# Patient Record
Sex: Male | Born: 1945
Health system: Southern US, Community
[De-identification: ages and names within clinical notes are randomized; demographics above are authoritative.]

## PROBLEM LIST (undated history)

## (undated) DIAGNOSIS — R296 Repeated falls: Secondary | ICD-10-CM

## (undated) DIAGNOSIS — K449 Diaphragmatic hernia without obstruction or gangrene: Secondary | ICD-10-CM

## (undated) DIAGNOSIS — K5790 Diverticulosis of intestine, part unspecified, without perforation or abscess without bleeding: Secondary | ICD-10-CM

## (undated) DIAGNOSIS — I8390 Asymptomatic varicose veins of unspecified lower extremity: Secondary | ICD-10-CM

## (undated) DIAGNOSIS — I714 Abdominal aortic aneurysm, without rupture, unspecified: Secondary | ICD-10-CM

## (undated) DIAGNOSIS — N200 Calculus of kidney: Secondary | ICD-10-CM

## (undated) DIAGNOSIS — E785 Hyperlipidemia, unspecified: Secondary | ICD-10-CM

## (undated) DIAGNOSIS — N189 Chronic kidney disease, unspecified: Secondary | ICD-10-CM

## (undated) DIAGNOSIS — H9193 Unspecified hearing loss, bilateral: Secondary | ICD-10-CM

## (undated) DIAGNOSIS — T7840XA Allergy, unspecified, initial encounter: Secondary | ICD-10-CM

## (undated) DIAGNOSIS — G4730 Sleep apnea, unspecified: Secondary | ICD-10-CM

## (undated) DIAGNOSIS — I509 Heart failure, unspecified: Secondary | ICD-10-CM

## (undated) DIAGNOSIS — I872 Venous insufficiency (chronic) (peripheral): Secondary | ICD-10-CM

## (undated) DIAGNOSIS — E291 Testicular hypofunction: Secondary | ICD-10-CM

## (undated) DIAGNOSIS — M1990 Unspecified osteoarthritis, unspecified site: Secondary | ICD-10-CM

## (undated) DIAGNOSIS — E538 Deficiency of other specified B group vitamins: Secondary | ICD-10-CM

## (undated) DIAGNOSIS — F32A Depression, unspecified: Secondary | ICD-10-CM

## (undated) DIAGNOSIS — K219 Gastro-esophageal reflux disease without esophagitis: Secondary | ICD-10-CM

## (undated) DIAGNOSIS — F329 Major depressive disorder, single episode, unspecified: Secondary | ICD-10-CM

## (undated) DIAGNOSIS — I10 Essential (primary) hypertension: Secondary | ICD-10-CM

## (undated) HISTORY — DX: Allergy, unspecified, initial encounter: T78.40XA

## (undated) HISTORY — PX: OTHER SURGICAL HISTORY: SHX169

## (undated) HISTORY — DX: Diaphragmatic hernia without obstruction or gangrene: K44.9

## (undated) HISTORY — DX: Chronic kidney disease, unspecified: N18.9

## (undated) HISTORY — DX: Hyperlipidemia, unspecified: E78.5

## (undated) HISTORY — DX: Testicular hypofunction: E29.1

## (undated) HISTORY — DX: Diverticulosis of intestine, part unspecified, without perforation or abscess without bleeding: K57.90

## (undated) HISTORY — DX: Unspecified osteoarthritis, unspecified site: M19.90

## (undated) HISTORY — DX: Deficiency of other specified B group vitamins: E53.8

## (undated) HISTORY — DX: Abdominal aortic aneurysm, without rupture, unspecified: I71.40

## (undated) HISTORY — DX: Asymptomatic varicose veins of unspecified lower extremity: I83.90

## (undated) HISTORY — DX: Gastro-esophageal reflux disease without esophagitis: K21.9

## (undated) HISTORY — DX: Calculus of kidney: N20.0

## (undated) HISTORY — PX: CARPAL TUNNEL RELEASE: SHX101

## (undated) HISTORY — PX: JOINT REPLACEMENT: SHX530

## (undated) HISTORY — DX: Venous insufficiency (chronic) (peripheral): I87.2

## (undated) HISTORY — DX: Essential (primary) hypertension: I10

## (undated) HISTORY — DX: Unspecified hearing loss, bilateral: H91.93

## (undated) HISTORY — DX: Abdominal aortic aneurysm, without rupture: I71.4

## (undated) HISTORY — DX: Sleep apnea, unspecified: G47.30

## (undated) HISTORY — PX: LITHOTRIPSY: SUR834

## (undated) HISTORY — PX: FRACTURE SURGERY: SHX138

---
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
## 1999-10-20 ENCOUNTER — Ambulatory Visit (HOSPITAL_COMMUNITY): Admission: RE | Admit: 1999-10-20 | Discharge: 1999-10-20 | Payer: Self-pay | Admitting: Internal Medicine

## 2000-07-09 ENCOUNTER — Encounter: Payer: Self-pay | Admitting: Internal Medicine

## 2000-07-09 ENCOUNTER — Ambulatory Visit (HOSPITAL_COMMUNITY): Admission: RE | Admit: 2000-07-09 | Discharge: 2000-07-09 | Payer: Self-pay | Admitting: Internal Medicine

## 2000-08-18 ENCOUNTER — Observation Stay (HOSPITAL_COMMUNITY): Admission: RE | Admit: 2000-08-18 | Discharge: 2000-08-19 | Payer: Self-pay | Admitting: Urology

## 2000-08-18 ENCOUNTER — Encounter: Payer: Self-pay | Admitting: Urology

## 2001-01-10 ENCOUNTER — Ambulatory Visit (HOSPITAL_BASED_OUTPATIENT_CLINIC_OR_DEPARTMENT_OTHER): Admission: RE | Admit: 2001-01-10 | Discharge: 2001-01-10 | Payer: Self-pay | Admitting: Pulmonary Disease

## 2001-02-03 ENCOUNTER — Ambulatory Visit (HOSPITAL_COMMUNITY): Admission: RE | Admit: 2001-02-03 | Discharge: 2001-02-03 | Payer: Self-pay | Admitting: Internal Medicine

## 2001-02-09 HISTORY — PX: KIDNEY STONE SURGERY: SHX686

## 2002-11-13 ENCOUNTER — Ambulatory Visit (HOSPITAL_COMMUNITY): Admission: RE | Admit: 2002-11-13 | Discharge: 2002-11-13 | Payer: Self-pay | Admitting: *Deleted

## 2002-11-13 ENCOUNTER — Encounter (INDEPENDENT_AMBULATORY_CARE_PROVIDER_SITE_OTHER): Payer: Self-pay

## 2003-02-18 ENCOUNTER — Emergency Department (HOSPITAL_COMMUNITY): Admission: EM | Admit: 2003-02-18 | Discharge: 2003-02-18 | Payer: Self-pay | Admitting: Emergency Medicine

## 2007-04-06 ENCOUNTER — Inpatient Hospital Stay (HOSPITAL_COMMUNITY): Admission: RE | Admit: 2007-04-06 | Discharge: 2007-04-10 | Payer: Self-pay | Admitting: Orthopedic Surgery

## 2007-12-25 ENCOUNTER — Emergency Department (HOSPITAL_BASED_OUTPATIENT_CLINIC_OR_DEPARTMENT_OTHER): Admission: EM | Admit: 2007-12-25 | Discharge: 2007-12-26 | Payer: Self-pay | Admitting: Emergency Medicine

## 2010-06-24 NOTE — Op Note (Signed)
Chad Duke, Chad Duke              ACCOUNT NO.:  [id number]   MEDICAL RECORD NO.:  [id number]          PATIENT TYPE:  INP   LOCATION:  0001                         FACILITY:  [facility]   PHYSICIAN:  Georges Lynch. Gioffre, M.D.DATE OF BIRTH:  April 18, 1945   DATE OF PROCEDURE:  04/06/2007  DATE OF DISCHARGE:                               OPERATIVE REPORT   SURGEON:  Georges Lynch. Darrelyn Hillock, M.D.   ASSISTANT:  Madlyn Frankel. Charlann Boxer, M.D.  Jamelle Rushing, P.A.-C.   PREOPERATIVE DIAGNOSIS:  Severe rheumatoid arthritis involving the left  hip.   POSTOPERATIVE DIAGNOSIS:  Severe rheumatoid arthritis involving the left  hip.   OPERATION:  Left total hip arthroplasty utilizing the DePuy system.  We  utilized a 54 mm ASR cup, a +2 spacer with a size 47 ball.  The Trilock  stem was a size 7 high offset.   PROCEDURE:  Under spinal anesthesia, with the patient on his right side  left side up, routine orthopedic prepping and draping of the left hip  was carried out.  At this time, a posterolateral approach to the left  hip was carried out.  Bleeders were identified and cauterized.  Self-  retaining retractors were inserted.  The incision was carried down  through the iliotibial band.  We went down and identified the external  rotators and partially detached those and then went down and did a  capsulectomy, dislocated the femoral head with great care taken not to  injure the underlying sciatic nerve.  Following that, we utilized the  appropriate femoral neck cutting guide and cut the femoral neck at the  appropriate level.  We then utilized our box chisel and then used our  widening reamer to ream out the lateral greater trochanteric region.  Following that, we then began our rasping. We started out with a size 1  and went all the way up to a size 7 rasp.  We had excellent fixation  with that.  We then removed the rasp and then reamed out the acetabulum  after we completed the capsulectomy.  We then reamed the  acetabulum up  to size 53 for a 54 mm cup.  Following that, we then inserted our 54 mm  ASR cup.  After that, we noted cup was fixed well and was in good  position.  We then utilized the Charnley guide to check out the  alignment of the cup.  Following that, we then went ahead and inserted  our size 7 Trilock stem.  We went through trials again. We first went  through trials with the permanent cup for the femoral neck length and we  finally selected a +2 spacer with a 47 mm cup.  We utilized a high  offset stem.  After all trials were removed, we then inserted our  permanent Trilock stem, size 7 with the +2 spacer and a size 47 mm ball.  We made sure the acetabulum was free of any soft tissue, reduced the  hip, took the hip through motion again.  We had excellent stability and  leg lengths once again were measured.  We measured the leg lengths  several times during the trials and we had excellent leg length  measurements.  We then irrigated the area out.  We then  closed the soft tissue in the usual fashion.  Great care, once again,  was taken to protect the sciatic nerve.  The subcu was closed with #1  Vicryl, the skin was closed with metal staples, and a sterile Neosporin  bundle dressing was applied.  He had 1 gram IV Ancef preop.           ______________________________  Georges Lynch. Darrelyn Hillock, M.D.     RAG/MEDQ  D:  04/06/2007  T:  04/06/2007  Job:  [id number]   cc:   Soyla Murphy. Renne Crigler, M.D.  Fax: [phone]   Lemmie Evens, M.D.  Fax: [phone]

## 2010-06-27 NOTE — Op Note (Signed)
[facility]  Patient:    Chad Duke, Chad Duke                       MRN: [id number] Proc. Date: 08/18/00 Adm. Date:  [id number] Attending:  Lauree Chandler                           Operative Report  PREOPERATIVE DIAGNOSIS:  Distal right ureteral calculus.  POSTOPERATIVE DIAGNOSES:  Distal right ureteral calculus, mild urethral stricture, distal right ureter.  PROCEDURE:  Cystoscopy, right retrograde pyelogram with interpretation, balloon dilation of right ureteral stricture and right ureteroscopic stone basketing and extraction.  SURGEON:  Dr. Larey Dresser.  ANESTHESIA:  General.  INDICATIONS FOR PROCEDURE:  This 65 year old gentleman has been having intermittent right flank pain for a month due to a 3 x 5 mm stone in the distal right ureter. He has had more pain now and wishes removal of this calculus at this time. He was advised of the risks of open surgery, postop pain, hemorrhage, etc.  DESCRIPTION OF PROCEDURE:  The patient was brought to the operating room and placed in lithotomy position. The external genitalia were prepped and draped in the usual fashion. He was cystoscoped and the anterior urethra was normal. The prostatic urethra was normal. The bladder was unremarkable. I then passed a guidewire up the right ureteral orifice and saw it go past the stone which was about 3 cm up the ureter. I thought that the ureteral orifice was not adequate to insert the ureteroscope without a balloon dilation, so I performed a balloon dilation for 3 minutes with the 4 cm balloon at 14 atmospheres of pressure. There was a waist in the balloon that never did clear and deflated the balloon and then reinserted the ureteroscope and was able to get up to the stone and was able to grasp the stone in a nitinol stone basket. In pulling back the ureteroscope, the stone would not enter the intramural ureter and so I moved the basket back up in the ureter and  pulled out the ureteroscope over the remaining length of the stone basket wire and then with the guidewire in place, reinserted a 4 cm balloon dilator and inflated it to 14 atmospheres of pressure and there was still a very small waste but much improved from what it was with the first dilation. After deflating the balloon, I was able to remove the stone in the basket intact. I then performed a repeat ureteroscopy and I felt that there was no damage to the ureter beyond the mucosal disruption from the balloon dilation. I then injected contrast through the scope and there was no extravasation and very prompt drainage of contrast from the distal ureter. He may have some postoperative spasm, but I believe it is medically safe not to leave in a double J catheter and the patient wished that I would leave one out if at all possible. The bladder was emptied, the guidewire removed from the right ureter and the patient sent to the recovery room in good condition. The patient tolerated the procedure well. DD:  08/18/00 TD:  08/19/00 Job: [id number]

## 2010-06-27 NOTE — H&P (Signed)
NAMECIAN, COSTANZO              ACCOUNT NO.:  [id number]   MEDICAL RECORD NO.:  [id number]        PATIENT TYPE:  LINP   LOCATION:                               FACILITY:  [facility]   PHYSICIAN:  Georges Lynch. Gioffre, M.D.DATE OF BIRTH:  11-29-1945   DATE OF ADMISSION:  04/06/2007  DATE OF DISCHARGE:                              HISTORY & PHYSICAL   PRE-ADMISSION HISTORY AND PHYSICAL   CHIEF COMPLAINT:  Painful loss of range of motion, left hip.   HISTORY OF PRESENT ILLNESS:  The patient is A 65 year old gentleman with  progressively worsening pain and loss of range of motion of his left  hip.  Evaluation reveals he has significant arthritic changes in his  left hip.  The patient has elected to proceed with a total hip  arthroplasty.   ALLERGIES:  NO KNOWN DRUG ALLERGIES.   CURRENT MEDICATIONS:  1. Remicade 4 vials every 4 weeks, last dose on January 29, next dose      4 weeks after surgery.  2. Combivent 2 puffs, 6 times a day.  3. Aspirin 81 mg a day.  4. Vitamin D 50-mg tablets, 1 tablet every 2 weeks.  5. Methotrexate 10 mg every Monday.  6. Prilosec 1 tablet a day.  7. Folic acid 1200 mg a day.  8. Triamterine/hydrochlorothiazide 37.5/25 mg a day.  9. One Multivitamin a day.  10.Mucinex full force, one tablespoons two times daily.  11.Calcium 600 plus D daily.  12.Zyrtec 1 tablet daily.  13.Meclizine 25 mg every 6 hours as p.r.n.   PRIMARY CARE PHYSICIAN:  Dr. Chauncey Cruel.   REVIEW OF SYSTEMS:  Positive for rheumatoid arthritis.  He denies any  hematologic her or oncologic issues.  NEUROLOGIC:  He has had recent  onset of vertigo with viral labyrinthitis, and he is currently using  meclizine.  It is otherwise negative.  PULMONARY:  He does have  significant asthma, bronchitis, COPD, last hospitalization in December  for breathing treatment.  He has never been on a vent.  He does use a  CPAP.  He does have occasional shortness of breath and cough.  CARDIOVASCULAR:   Positive for borderline hypertension, just on the  diuretic.  He had a recent stress test which was reported to be normal.  GI:  He does have a hiatal hernia and some reflux issues.  GU:  He has  had kidney stones in the past.  He currently does have kidney stones and  his urinary tract system.  He has negative endocrine issues.   PAST SURGICAL ISSUES:  Include kidney stones and 1998, 2002.  Carpal  tunnel release, both __________ .  He has had an arthroscope in his left  knee, left shoulder joint replacement in 97. He has had surgery on his  right hand to clean the joints of his index finger.  Denies any  complications with anesthesia.   FAMILY MEDICAL HISTORY:  Mother deceased at the age of 16 from  complications of cervical cancer, stroke, and cardiomyopathy.  Father is  deceased at the age of 54 from old age with arthritis.  He  does have a  brother with heart disease and arthritis.   SOCIAL HISTORY:  The patient is divorced.  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  He smokes about a pack and a half a day.  He uses about  three beers a week.  He has two grown children.  He lives [place] a single  [place], one floor.   PHYSICAL EXAM:  VITAL SIGNS:  Height is 5 feet, 7 inches, weight is 237  pounds, blood pressure is 114/74, pulse of 74 and regular, respirations  are 16, nonlabored, patient is afebrile.  GENERAL:  This a healthy-appearing, well-developed gentleman, conscious,  alert and appropriate, appears to be a good historian.  HEENT:  Head was  normocephalic.  Hearing was grossly intact.  NECK:  Supple.  No palpable lymphadenopathy.  He was able to touch the  chin to his chin.  He only look up about 30 degrees above horizontal  position, rotating his head right and left without discomfort.  CHEST:  The patient had wheezing in all fields with inspiration.  The  patient reports this to be a good day, and he wheezes frequently.  He  has had a handheld nebulizer treatment  this morning.  HEART:  Regular rate and rhythm.  No murmurs, rubs, gallops.  ABDOMEN:  Positive bowel sounds, soft.  EXTREMITIES:  Upper extremities were symmetric size and shape.  Right  arm:  He could raise vertical towards the ceiling.  Left arm:  He could  only raise about a 50 degrees from his side.  That is the side he had  his shoulder hemiarthroplasty.  Motor strength is 5/5.  LOWER EXTREMITIES:  Right hip had full extension, flexion of 130 with 20  to 30 degrees internal-external rotation.  Left hip:  He had full  extension.  He could flex it up to 120 degrees.  He had 10 degrees of  internal rotation, 0 degrees of external rotation.  Both knees had no  signs of erythema or ecchymosis.  He had full range of motion of both  knees.  Ankles had good motion symmetrically.  PERIPHERAL VASCULAR:  Carotid pulses were 2+, no bruits.  Radial pulses  2+.  Dorsalis pedis pulses were 1+.  He had 1+ pitting edema bilateral  lower extremities.  He had pigmentation changes in both lower  extremities.  NEURO:  The patient was conscious, alert and appropriate.  No gross  neurologic defects.  BREAST, RECTAL AND GU:  Exams were deferred at this time.   IMPRESSION:  1. Advanced osteoarthritis, left hip.  2. Hypertension.  3. Rheumatoid arthritis.  4. Chronic obstructive pulmonary disease, asthma, bronchitis.  5. Uses CPAP machine.  6. Hiatal hernia.  7. Reflux disease.  8. History of kidney stones.   PLAN:  Patient has been evaluated for a pre-operative evaluation by his  primary care physician, had a recent stress test reported normal.  The  patient had a recent onset of vertigo.  He is currently using meclizine.  The patient will undergo all routine labs and tests prior to having a  left total hip arthroplasty by Dr. Darrelyn Hillock at [facility] on  April 06, 2007.  The patient had no other further questions related  to his upcoming surgery.      Jamelle Rushing, P.A.     ______________________________  Georges Lynch Darrelyn Hillock, M.D.    RWK/MEDQ  D:  03/21/2007  T:  03/21/2007  Job:  [id number]

## 2010-06-27 NOTE — Discharge Summary (Signed)
Chad Duke, Chad Duke              ACCOUNT NO.:  [id number]   MEDICAL RECORD NO.:  [id number]          PATIENT TYPE:  INP   LOCATION:  1615                         FACILITY:  [facility]   PHYSICIAN:  Georges Lynch. Gioffre, M.D.DATE OF BIRTH:  Sep 08, 1945   DATE OF ADMISSION:  04/06/2007  DATE OF DISCHARGE:  04/10/2007                               DISCHARGE SUMMARY   ADMISSION DIAGNOSES:  1. Osteoarthritis of his left hip.  2. Rheumatoid arthritis.  3. Vertigo.  4. Reflux disease.  5. History kidney stones.  6. History of tobacco use.  7. History of COPD.  8. History of sleep apnea.   DISCHARGE DIAGNOSES:  1. Left total hip arthroplasty.  2. Rheumatoid arthritis.  3. Vertigo.  4. Hiatal hernia.  5. Reflux disease.  6. Kidney stones.  7. History of history of tobacco use.  8. History of COPD.  9. History of sleep apnea.   HISTORY OF PRESENT ILLNESS:  The patient is 65 year old gentleman was  evaluated by Dr. Darrelyn Hillock for left hip pain.  X-rays revealed he does  have significant arthritic changes of the hip.  The patient has pain  with ambulation and range of motion.  The patient has failed  conservative treatment.  The patient elected to proceed with a total hip  arthroplasty.   ALLERGIES:  NO KNOWN DRUG ALLERGIES.   MEDICATIONS ON ADMISSION:  1. Remicade infusion IV every 8 weeks.  2. Vitamin D 50,000 units twice a month.  3. Triamterene/hydrochlorothiazide 37.5/25 mg once a day.  4. Methotrexate 10 mg once a week on Mondays  5. Cetirizine hydrochlorothiazide 10 mg a day.  6. Mucinex nasal spray one spray each nostril twice a day.  7. Combivent inhaler 2 puffs every 8 hours.  8. Folic acid 400 mcg 3 tablets in the a.m.  9. Caltrate 600 plus D once a day.  10.Prilosec OTC one tablet a day.  11.Bayer aspirin 81 mg a day.  12.Multivitamin once a day.  13.Meclizine 25 mg once a day.  14.Albuterol and Combivent inhalers p.r.n.   SURGICAL PROCEDURES:  On May 04, 2007, the  patient was taken to the OR  by Dr. Worthy Rancher assisted by Dr. Lajoyce Corners and Oneida Alar, PA-C and  under spinal anesthesia the patient underwent a left total hip  arthroplasty.  The femoral head was sent to pathology for evaluation.  Estimated blood loss was 300 ml.  There were no complications.  The  patient tolerated the procedure well, was transferred to the recovery  room then to the orthopedic floor in good condition.  The patient had  the following components implanted:  An ASR uni-femoral implant, size 47  and size 54 ASR acetabular cup, +2 taper adapter sleeve in a size 7 high  offset Trilox pin.   CONSULTANT:  Following routine consults requested physical therapy,  occupational therapy, case management.   HOSPITAL COURSE:  On May 04, 2007, the patient was admitted to [facility] under the care of Dr. Worthy Rancher.  The patient was taken  to the OR where a left total hip arthroplasty was  performed without any  complications.  The patient was transferred recovery room and then to  orthopedic floor in good condition, IV antibiotics, pain medicines and  DVT prophylaxis.  The patient follow total hip protocol.  The patient  was able to transition from IV medications to p.o. medications well.  His wound remained benign for any signs of infection.  His leg remained  neuromotor vascularly intact.  Due to his size and physical abilities  the patient was having a hard time maintaining touchdown weightbearing,  so he was transitioned to partial weightbearing to proceed with physical  therapy.  The patient worked well with physical therapy and on postop  day #4, he was felt to be medically and orthopedically stable ready for  discharge home.  Arrangements were made for outpatient home health  physical therapy and monitoring his Coumadin.  The patient was  discharged home in good condition with a 2-week follow-up  recommendations.   LABORATORY DATA:  CBC on admission found WBCs  9.3, hemoglobin 13.6,  hematocrit 39.6, platelets 212.  On discharge, his hemoglobin dropped to  11.5 and hematocrit of 32.6.  His INR on discharge was 2.0.  Routine  chemistries on admission were normal.   DISCHARGE INSTRUCTIONS:  1. Diet no restrictions.  2. Wound care:  The patient is to keep wound clean and dry.  The      patient is to change dressing daily.  3. Activity:  The patient is to increase activity slowly.  He is to      maintain partial 50% weightbearing with his left lower extremity      with the use of walker until changed by Dr. Darrelyn Hillock.   FOLLOW UP:  The patient is to follow-up with Dr. Darrelyn Hillock 2 weeks from  date of surgery with Dr. Darrelyn Hillock in the office.  The patient is to call  [phone] for that follow-up appointment.   MEDICATIONS:  1. Remicade infusion IV once every 8 weeks.  2. Infusion in 4 weeks.  3. Vitamin D 50,000 units twice a month.  4. Triamterene/hydrochlorothiazide 37.5/25 mg once a day.  5. Methotrexate 10 mg once a week on Mondays.  6. Cetirizine KCl 10 mg once a day.  7. Mucinex nasal spray one spray each nostril twice a day.  8. Combivent inhaler 2 puffs every 6 hours.  9. Folic acid 400 mcg 3 tablets a day.  10.Caltrate 600 plus D once a day.  11.Prilosec OTC once a day.  12.Bayer aspirin 81 mg once a day.  13.Multivitamins once a day.  14.Meclizine 25 mg p.o. q. p.r.n.  15.Albuterol/Combivent MDI inhalers p.r.n.  16.Percocet 10/650 one tablet every 4-6 hours for pain if needed.  17.Robaxin 500 mg once every 6 hours for muscle spasms if needed.  18.Coumadin 5 mg a day unless changed by outpatient home health      physical therapist for total 30 days.   CONDITION ON DISCHARGE:  The patient's condition upon discharge to home  is listed improved and good.      Jamelle Rushing, P.A.    ______________________________  Georges Lynch Darrelyn Hillock, M.D.    RWK/MEDQ  D:  04/20/2007  T:  04/21/2007  Job:  [id number]   cc:   Windy Fast A. Darrelyn Hillock, M.D.   Fax: [phone]

## 2010-06-27 NOTE — Op Note (Signed)
   [name], [name]                          ACCOUNT NO.:  [id number]   MEDICAL RECORD NO.:  [id number]                   PATIENT TYPE:  AMB   LOCATION:  ENDO                                 FACILITY[facility]  [facility]   PHYSICIAN:  Georgiana Spinner, M.D.                 DATE OF BIRTH:  1945-07-04   DATE OF PROCEDURE:  DATE OF DISCHARGE:                                 OPERATIVE REPORT   PROCEDURE PERFORMED:  Upper endoscopy.   ENDOSCOPIST:  Georgiana Spinner, M.D.   INDICATIONS FOR PROCEDURE:  Gastroesophageal reflux disease.   ANESTHESIA:  Demerol 60 mg, Versed 7 mg.   DESCRIPTION OF PROCEDURE:  With the patient mildly sedated in the left  lateral decubitus position, the Olympus video endoscope was inserted in the  mouth and passed under direct vision through the esophagus.  There was a  questionable area of Barrett's photographed and subsequently biopsied.  We  entered into the stomach through a hiatal hernia.  The fundus, body, antrum,  duodenal bulb and second portion of the duodenum were visualized.  From this  point, the endoscope was slowly withdrawn taking circumferential views of  the entire duodenal mucosa until the endoscope was pulled back into the  stomach and placed on retroflexion to view the stomach from below which  again showed a hiatal hernia.  The endoscope was then straightened and  withdrawn taking circumferential views of the remaining gastric and  esophageal mucosa stopping in the body of the stomach at which point it  appeared somewhat fuzzy and villiform almost.  This was biopsied.  The  patient's vital signs and pulse oximeter remained stable.  The patient  tolerated the procedure well without apparent complications.   FINDINGS:  Question of Barrett's esophagus biopsied, hiatal hernia, and  changes of the body of the stomach biopsied, await biopsy reports.  The  patient will call me for results and follow-up with me as an outpatient.  Proceed to colonoscopy as  planned.   \                                                Georgiana Spinner, M.D.    GMO/MEDQ  D:  11/13/2002  T:  11/13/2002  Job:  [id number]

## 2010-06-27 NOTE — Op Note (Signed)
   NAMEVICENT, Chad Duke                          ACCOUNT NO.:  [id number]   MEDICAL RECORD NO.:  [id number]                   PATIENT TYPE:  AMB   LOCATION:  ENDO                                 [facility]ACILITY:  [facility]   PHYSICIAN:  Georgiana Spinner, M.D.                 DATE OF BIRTH:  1945/05/27   DATE OF PROCEDURE:  DATE OF DISCHARGE:                                 OPERATIVE REPORT   PROCEDURE PERFORMED:  Colonoscopy.   ENDOSCOPIST:  Georgiana Spinner, M.D.   INDICATIONS FOR PROCEDURE:  Hemoccult positivity.   ANESTHESIA:  Demerol 40 mg, Versed 1 mg additionally.   DESCRIPTION OF PROCEDURE:  With the patient mildly sedated in the left  lateral decubitus position, the Olympus video colonoscope was inserted in  the rectum and passed under direct vision to the cecum, identified by the  ileocecal valve and appendiceal orifice, both of which were photographed.  From this point the colonoscope was slowly withdrawn taking circumferential  views of the entire colonic mucosa stopping only in the rectum which  appeared normal on direct and retroflex view.  The endoscope was  straightened and withdrawn.  The patient's vital signs and pulse oximeter  remained stable.  The patient tolerated the procedure well without apparent  complications.   FINDINGS:  Occasional diverticulum in the sigmoid colon.  Otherwise  unremarkable colonoscopic examination to the cecum.   PLAN:  See endoscopy note for further details.                                                 Georgiana Spinner, M.D.    GMO/MEDQ  D:  11/13/2002  T:  11/13/2002  Job:  [id number]

## 2010-10-31 LAB — URINE CULTURE
Colony Count: NO GROWTH
Culture: NO GROWTH
Special Requests: NEGATIVE

## 2010-10-31 LAB — TYPE AND SCREEN
ABO/RH(D): A POS
Antibody Screen: NEGATIVE

## 2010-10-31 LAB — DIFFERENTIAL
Basophils Absolute: 0
Basophils Relative: 0
Eosinophils Absolute: 0.3
Eosinophils Relative: 4
Lymphocytes Relative: 32
Lymphs Abs: 3
Monocytes Absolute: 0.8
Monocytes Relative: 8
Neutro Abs: 5.2
Neutrophils Relative %: 56

## 2010-10-31 LAB — CBC
HCT: 39.6
Hemoglobin: 13.6
MCHC: 34.5
MCV: 97.8
Platelets: 212
RBC: 4.05 — ABNORMAL LOW
RDW: 14.6
WBC: 9.3

## 2010-10-31 LAB — HEMOGLOBIN AND HEMATOCRIT, BLOOD
HCT: 32.6 — ABNORMAL LOW
HCT: 34.4 — ABNORMAL LOW
HCT: 34.6 — ABNORMAL LOW
HCT: 34.8 — ABNORMAL LOW
Hemoglobin: 11.5 — ABNORMAL LOW
Hemoglobin: 11.8 — ABNORMAL LOW
Hemoglobin: 11.9 — ABNORMAL LOW
Hemoglobin: 12 — ABNORMAL LOW

## 2010-10-31 LAB — PROTIME-INR
INR: 0.9
INR: 1.1
INR: 1.3
INR: 1.7 — ABNORMAL HIGH
Prothrombin Time: 12.6
Prothrombin Time: 14
Prothrombin Time: 16.5 — ABNORMAL HIGH
Prothrombin Time: 20.2 — ABNORMAL HIGH

## 2010-10-31 LAB — COMPREHENSIVE METABOLIC PANEL
ALT: 23
AST: 22
Albumin: 3.4 — ABNORMAL LOW
Alkaline Phosphatase: 56
BUN: 11
CO2: 30
Calcium: 9
Chloride: 102
Creatinine, Ser: 1.04
GFR calc Af Amer: >60
GFR calc non Af Amer: >60
Glucose, Bld: 94
Potassium: 3.7
Sodium: 139
Total Bilirubin: 0.7
Total Protein: 7

## 2010-10-31 LAB — URINALYSIS, ROUTINE W REFLEX MICROSCOPIC
Bilirubin Urine: NEGATIVE
Glucose, UA: NEGATIVE
Hgb urine dipstick: NEGATIVE
Ketones, ur: NEGATIVE
Nitrite: NEGATIVE
Protein, ur: NEGATIVE
Specific Gravity, Urine: 1.01
Urobilinogen, UA: 0.2
pH: 7

## 2010-10-31 LAB — ABO/RH: ABO/RH(D): A POS

## 2010-10-31 LAB — APTT: aPTT: 30

## 2010-11-03 LAB — PROTIME-INR
INR: 2 — ABNORMAL HIGH
Prothrombin Time: 23.5 — ABNORMAL HIGH

## 2011-02-12 ENCOUNTER — Encounter: Payer: Self-pay | Admitting: Emergency Medicine

## 2011-02-12 ENCOUNTER — Ambulatory Visit (INDEPENDENT_AMBULATORY_CARE_PROVIDER_SITE_OTHER): Payer: BC Managed Care – PPO | Admitting: Emergency Medicine

## 2011-02-12 DIAGNOSIS — G4730 Sleep apnea, unspecified: Secondary | ICD-10-CM

## 2011-02-12 DIAGNOSIS — J449 Chronic obstructive pulmonary disease, unspecified: Secondary | ICD-10-CM

## 2011-02-12 NOTE — Patient Instructions (Signed)
Please continue your Symbicort twice a day Use your Ventolin as needed. Try to use this instead of your combivent to see if you can tell a difference between the two rescue medications.  Use the electronic cigarette to work on stopping smoking We will perform full pulmonary testing at your next visit Walking oximetry today Follow with Dr Delton Coombes in 1 month with full PFT

## 2011-02-12 NOTE — Assessment & Plan Note (Signed)
-   continue symbicort and prn combivent + albuterol for now; he is hesitant to start spiriva because he does n't want to give up his combi]vent - full PFT - walking oximetry today - discussed smoking cessation in detail, he is going to tryt to cut down by using the electronic cigarette.  - rov 1 mo with PFT

## 2011-02-12 NOTE — Progress Notes (Signed)
  Subjective:    Patient ID: Chad Duke, male    DOB: Oct 19, 1945, 66 y.o.   MRN: [id number] HPI 65yo man childhood asthma, active smoker (50 pk-yrs), carries dx COPD made in the 63's. Hx of RA on MTX (since 90's) + remicade, OSA on CPAP, allergic rhinitis. On combivent prn, uses it frequently through the day. Also on Symbicort bid. Uses frequent rescue meds - usually albuterol/atrovent together. Exacerbates about     Review of Systems  Constitutional: Negative.  Negative for fever and unexpected weight change.  HENT: Negative.  Negative for ear pain, nosebleeds, congestion, sore throat, rhinorrhea, sneezing, trouble swallowing, dental problem, postnasal drip and sinus pressure.   Eyes: Negative.  Negative for redness and itching.  Respiratory: Positive for cough and shortness of breath. Negative for chest tightness and wheezing.   Cardiovascular: Positive for leg swelling. Negative for palpitations.  Gastrointestinal: Negative.  Negative for nausea and vomiting.  Genitourinary: Negative.  Negative for dysuria.  Musculoskeletal: Negative.  Negative for joint swelling.  Skin: Negative.  Negative for rash.  Neurological: Negative.  Negative for headaches.  Hematological: Negative.  Does not bruise/bleed easily.  Psychiatric/Behavioral: Negative.  Negative for dysphoric mood. The patient is not nervous/anxious.     Past Medical History  Diagnosis Date  . Emphysema   . Asthma   . Sleep apnea     On CPAP     Family History  Problem Relation Age of Onset  . Emphysema Father   . Asthma Father   . Heart attack Mother   . Heart attack Brother   . Osteoarthritis Father   . Uterine cancer Mother      History   Social History  . Marital Status: Divorced    Spouse Name: N/A    Number of Children: N/A  . Years of Education: N/A   Occupational History  . Not on file.   Social History Main Topics  . Smoking status: Current Everyday Smoker -- 1.0 packs/day for 48 years   Types: Cigarettes  . Smokeless tobacco: Not on file  . Alcohol Use: Not on file  . Drug Use: Not on file  . Sexually Active: Not on file   Other Topics Concern  . Not on file   Social History Narrative  . No narrative on file     No Known Allergies   No outpatient prescriptions prior to visit.         Objective:   Physical Exam  Gen: Pleasant, obese, in no distress,  normal affect  ENT: No lesions,  mouth clear,  oropharynx clear, no postnasal drip  Neck: No JVD, no TMG, no carotid bruits  Lungs: No use of accessory muscles, no dullness to percussion, clear without rales or rhonchi  Cardiovascular: RRR, heart sounds normal, no murmur or gallops, no peripheral edema  Musculoskeletal: No deformities, no cyanosis or clubbing  Neuro: alert, non focal  Skin: Warm, no lesions or rashes     Assessment & Plan:  COPD (chronic obstructive pulmonary disease) - continue symbicort and prn combivent + albuterol for now; he is hesitant to start spiriva because he does n't want to give up his combi]vent - full PFT - walking oximetry today - discussed smoking cessation in detail, he is going to tryt to cut down by using the electronic cigarette.  - rov 1 mo with PFT  Sleep apnea Continue current CPAP, may need retitration in he future.

## 2011-02-12 NOTE — Assessment & Plan Note (Signed)
Continue current CPAP, may need retitration in he future.

## 2011-02-13 ENCOUNTER — Telehealth: Payer: Self-pay | Admitting: Critical Care Medicine

## 2011-02-13 MED ORDER — ALBUTEROL SULFATE HFA 108 (90 BASE) MCG/ACT IN AERS: 2.0000 | INHALATION_SPRAY | RESPIRATORY_TRACT | Status: DC | PRN

## 2011-02-13 NOTE — Telephone Encounter (Signed)
[facility] called, pt wants albuterol HFA refill, he is not using combivent any longer I refilled proair

## 2011-02-19 ENCOUNTER — Institutional Professional Consult (permissible substitution): Payer: Self-pay | Admitting: Internal Medicine

## 2011-03-05 ENCOUNTER — Institutional Professional Consult (permissible substitution): Payer: Self-pay | Admitting: Internal Medicine

## 2011-03-12 ENCOUNTER — Encounter: Payer: Self-pay | Admitting: Internal Medicine

## 2011-03-12 ENCOUNTER — Ambulatory Visit (INDEPENDENT_AMBULATORY_CARE_PROVIDER_SITE_OTHER): Payer: BC Managed Care – PPO | Admitting: Internal Medicine

## 2011-03-12 VITALS — BP 116/60 | HR 93 | Ht 67.0 in | Wt 255.8 lb

## 2011-03-12 DIAGNOSIS — J449 Chronic obstructive pulmonary disease, unspecified: Secondary | ICD-10-CM

## 2011-03-12 DIAGNOSIS — J4489 Other specified chronic obstructive pulmonary disease: Secondary | ICD-10-CM

## 2011-03-12 DIAGNOSIS — G4733 Obstructive sleep apnea (adult) (pediatric): Secondary | ICD-10-CM

## 2011-03-12 DIAGNOSIS — G4730 Sleep apnea, unspecified: Secondary | ICD-10-CM

## 2011-03-12 NOTE — Patient Instructions (Signed)
Order- [facility] - DME/ [facility]-  Replacement CPAP machine AutoPAP  5-20 cwp, humidifer, mask of choice and supplies    Dx OSA

## 2011-03-12 NOTE — Progress Notes (Signed)
03/12/11- 66 yoM smoker seen for sleep medicine evaluation at the kind request of Dr.Pharr. He is followed by Dr. Delton Coombes 4 COPD/ongoing tobacco use. He was diagnosed with obstructive sleep apnea in 2001 with a sleep study at [facility] on 07/29/1999-AHI 150 per hour. He has used CPAP/AutoPap which she says is set at a pressure 14. It is not clear if this is a fixed pressure setting, with the most common pressure determined by AutoSet. He has had a fullface mask which occasionally leaks. Uses his machine all night every night. He denies daytime sleepiness except that he needs coffee in the morning to get started. He initially implied that he needed a new machine, or repairs, but then he said that he was satisfied with his current machine and was interested in possibly changing home care companies to one close to his home. Bedtime 11:30 PM to 1 AM. Sleep latency 30 minutes waking several times during the night, sometimes for bathroom, before up between 5 and 7 AM. He has gained about 10 pounds in the last 2 years. No ENT surgery. He is aware that he has significant COPD but continues to smoke against advice. Life long asthma. Treated for hypertension but otherwise denies heart disease. Has had several orthopedic surgeries. 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  ROS-see HPI Constitutional:   No-   weight loss, night sweats, fevers, chills, fatigue, lassitude. HEENT:   No-  headaches, difficulty swallowing, tooth/dental problems, sore throat,       No-  sneezing, itching, ear ache, nasal congestion, post nasal drip,  CV:  No-   chest pain, orthopnea, PND, swelling in lower extremities, anasarca,  dizziness, palpitations Resp: No- acute  shortness of breath with exertion or at rest.              Some  productive cough,  No non-productive cough,  No- coughing up of blood.              No-   change in color of mucus.  No- wheezing.   Skin: No-    rash or lesions. GI:  No-   heartburn, indigestion, abdominal pain, nausea, vomiting, diarrhea,                 change in bowel habits, loss of appetite GU: No-   dysuria,  .MS:  No-   joint pain or swelling.  No- decreased range of motion.  No- back pain. Neuro-     nothing unusual Psych:  No- change in mood or affect. No depression or anxiety.  No memory loss.  OBJ- Physical Exam General- Alert, Oriented, Affect-appropriate, Distress- none acute, obese Skin- rash-none, lesions- none, excoriation- none Lymphadenopathy- none Head- atraumatic            Eyes- Gross vision intact, PERRLA, conjunctivae and secretions clear            Ears- Hearing, canals-normal            Nose- Clear, no-Septal dev, mucus, polyps, erosion, perforation             Throat- Mallampati III , mucosa clear , drainage- none, tonsils- atrophic Neck- flexible , trachea midline, no stridor , thyroid nl, carotid no bruit Chest - symmetrical excursion , unlabored           Heart/CV- RRR , no murmur , no gallop  , no rub, nl s1 s2                           -  JVD- none , edema- none, stasis changes- none, varices- none           Lung- I&E wheeze, unlabored, cough- none , dullness-none, rub- none           Chest wall-  Abd- tender-no, distended-no, bowel sounds-present, HSM- no Br/ Gen/ Rectal- Not done, not indicated Extrem- cyanosis- none, clubbing, none, atrophy- none, strength- nl Neuro- grossly intact to observation

## 2011-03-13 ENCOUNTER — Encounter: Payer: Self-pay | Admitting: Internal Medicine

## 2011-03-13 NOTE — Assessment & Plan Note (Signed)
He considers his breathing at baseline for this visit. I attempted to engage him in a discussion of smoking cessation but he was not receptive.

## 2011-03-13 NOTE — Assessment & Plan Note (Signed)
As indicated good compliance and control with CPAP. The patient quit negative and I both had some difficulty understanding clearly from him that it was wanted now. FOC implied he wanted a new machine but then said he would keep the one he had. He wanted to change to a DME company closer to his home with an indicated he wants to change. I ordered replacement to have that on file, but it will be up to him to act upon that.

## 2011-03-19 ENCOUNTER — Encounter: Payer: Self-pay | Admitting: Pulmonary Disease

## 2011-03-26 ENCOUNTER — Ambulatory Visit (INDEPENDENT_AMBULATORY_CARE_PROVIDER_SITE_OTHER): Payer: BC Managed Care – PPO | Admitting: Emergency Medicine

## 2011-03-26 ENCOUNTER — Encounter: Payer: Self-pay | Admitting: Emergency Medicine

## 2011-03-26 VITALS — BP 112/78 | HR 83 | Temp 98.2°F | Ht 67.0 in | Wt 251.0 lb

## 2011-03-26 DIAGNOSIS — J449 Chronic obstructive pulmonary disease, unspecified: Secondary | ICD-10-CM

## 2011-03-26 LAB — PULMONARY FUNCTION TEST

## 2011-03-26 NOTE — Progress Notes (Signed)
  Subjective:    Patient ID: Chad Duke, male    DOB: Dec 20, 1945, 66 y.o.   MRN: [id number] HPI 66yo man childhood asthma, active smoker (50 pk-yrs), carries dx COPD made in the 66's. Hx of RA on MTX (since 90's) + remicade, OSA on CPAP, allergic rhinitis. On combivent prn, uses it frequently through the day. Also on Symbicort bid. Uses frequent rescue meds - usually albuterol/atrovent together. Exacerbates about   ROV 03/26/11 -- hx of tobacco, asthma, RA on MTX and remicade. Still smokes. Here after PFT today. Moderately severe AFL w positive response to BD. He is still using frequent Combivent and Duonebs prn. He is using CPAP every night.       Objective:   Physical Exam  Gen: Pleasant, obese, in no distress,  normal affect  ENT: No lesions,  mouth clear,  oropharynx clear, no postnasal drip  Neck: No JVD, no TMG, no carotid bruits  Lungs: No use of accessory muscles, no dullness to percussion, clear without rales or rhonchi  Cardiovascular: RRR, heart sounds normal, no murmur or gallops, no peripheral edema  Musculoskeletal: No deformities, no cyanosis or clubbing  Neuro: alert, non focal  Skin: Warm, no lesions or rashes     Assessment & Plan:  COPD (chronic obstructive pulmonary disease) - discussed smoking cessation in detail - stay off Spiriva for now - he wants to continue his combivent prn - symbocrt bid - rov october

## 2011-03-26 NOTE — Patient Instructions (Signed)
Please continue your inhaled medications as you are taking them Your goal is to cut down to 15 cigarettes a day by our next visit Follow with Dr Delton Coombes in October 2013

## 2011-03-26 NOTE — Progress Notes (Signed)
PFT done today. 

## 2011-03-26 NOTE — Assessment & Plan Note (Addendum)
-   discussed smoking cessation in detail - stay off Spiriva for now - he wants to continue his combivent prn - symbocrt bid - rov october

## 2011-11-12 ENCOUNTER — Telehealth: Payer: Self-pay | Admitting: Emergency Medicine

## 2011-11-12 NOTE — Telephone Encounter (Signed)
called and spoke to pt on 10/22/11 to make appt per recall  He stated he was out of town and would call us in Oct to make his appt.  Mailed recall letter. Chad Duke

## 2011-12-15 DIAGNOSIS — M069 Rheumatoid arthritis, unspecified: Secondary | ICD-10-CM | POA: Diagnosis not present

## 2011-12-18 DIAGNOSIS — J449 Chronic obstructive pulmonary disease, unspecified: Secondary | ICD-10-CM | POA: Diagnosis not present

## 2011-12-18 DIAGNOSIS — J45909 Unspecified asthma, uncomplicated: Secondary | ICD-10-CM | POA: Diagnosis not present

## 2011-12-18 DIAGNOSIS — G4733 Obstructive sleep apnea (adult) (pediatric): Secondary | ICD-10-CM | POA: Diagnosis not present

## 2011-12-18 DIAGNOSIS — Z23 Encounter for immunization: Secondary | ICD-10-CM | POA: Diagnosis not present

## 2011-12-22 DIAGNOSIS — M069 Rheumatoid arthritis, unspecified: Secondary | ICD-10-CM | POA: Diagnosis not present

## 2012-01-11 DIAGNOSIS — M069 Rheumatoid arthritis, unspecified: Secondary | ICD-10-CM | POA: Diagnosis not present

## 2012-01-21 DIAGNOSIS — M069 Rheumatoid arthritis, unspecified: Secondary | ICD-10-CM | POA: Diagnosis not present

## 2012-02-09 DIAGNOSIS — M069 Rheumatoid arthritis, unspecified: Secondary | ICD-10-CM | POA: Diagnosis not present

## 2012-03-11 DIAGNOSIS — Z79899 Other long term (current) drug therapy: Secondary | ICD-10-CM | POA: Diagnosis not present

## 2012-03-11 DIAGNOSIS — K219 Gastro-esophageal reflux disease without esophagitis: Secondary | ICD-10-CM | POA: Diagnosis not present

## 2012-03-11 DIAGNOSIS — Z0000 Encounter for general adult medical examination without abnormal findings: Secondary | ICD-10-CM | POA: Diagnosis not present

## 2012-03-11 DIAGNOSIS — Z125 Encounter for screening for malignant neoplasm of prostate: Secondary | ICD-10-CM | POA: Diagnosis not present

## 2012-03-11 DIAGNOSIS — M069 Rheumatoid arthritis, unspecified: Secondary | ICD-10-CM | POA: Diagnosis not present

## 2012-03-15 DIAGNOSIS — B079 Viral wart, unspecified: Secondary | ICD-10-CM | POA: Diagnosis not present

## 2012-03-15 DIAGNOSIS — Z1212 Encounter for screening for malignant neoplasm of rectum: Secondary | ICD-10-CM | POA: Diagnosis not present

## 2012-03-15 DIAGNOSIS — R609 Edema, unspecified: Secondary | ICD-10-CM | POA: Diagnosis not present

## 2012-03-15 DIAGNOSIS — J45909 Unspecified asthma, uncomplicated: Secondary | ICD-10-CM | POA: Diagnosis not present

## 2012-03-15 DIAGNOSIS — J449 Chronic obstructive pulmonary disease, unspecified: Secondary | ICD-10-CM | POA: Diagnosis not present

## 2012-03-15 DIAGNOSIS — M069 Rheumatoid arthritis, unspecified: Secondary | ICD-10-CM | POA: Diagnosis not present

## 2012-03-15 DIAGNOSIS — I452 Bifascicular block: Secondary | ICD-10-CM | POA: Diagnosis not present

## 2012-03-22 DIAGNOSIS — M069 Rheumatoid arthritis, unspecified: Secondary | ICD-10-CM | POA: Diagnosis not present

## 2012-03-31 ENCOUNTER — Encounter: Payer: Self-pay | Admitting: Sports Medicine

## 2012-03-31 ENCOUNTER — Ambulatory Visit
Admission: RE | Admit: 2012-03-31 | Discharge: 2012-03-31 | Disposition: A | Discharge status: Home / Self Care | Payer: Medicare Other | Source: Ambulatory Visit | Attending: Sports Medicine | Admitting: Sports Medicine

## 2012-03-31 ENCOUNTER — Ambulatory Visit (INDEPENDENT_AMBULATORY_CARE_PROVIDER_SITE_OTHER): Payer: Medicare Other | Admitting: Sports Medicine

## 2012-03-31 VITALS — BP 110/76 | HR 78 | Ht 67.0 in | Wt 244.0 lb

## 2012-03-31 DIAGNOSIS — M069 Rheumatoid arthritis, unspecified: Secondary | ICD-10-CM

## 2012-03-31 DIAGNOSIS — M25579 Pain in unspecified ankle and joints of unspecified foot: Secondary | ICD-10-CM | POA: Diagnosis not present

## 2012-03-31 DIAGNOSIS — M7989 Other specified soft tissue disorders: Secondary | ICD-10-CM | POA: Diagnosis not present

## 2012-03-31 DIAGNOSIS — R269 Unspecified abnormalities of gait and mobility: Secondary | ICD-10-CM | POA: Diagnosis not present

## 2012-03-31 DIAGNOSIS — M19079 Primary osteoarthritis, unspecified ankle and foot: Secondary | ICD-10-CM | POA: Diagnosis not present

## 2012-03-31 NOTE — Progress Notes (Signed)
Patient ID: Chad Duke, male   DOB: 04/15/1945, 67 y.o.   MRN: [id number] Referred courtesy of Dr Renne Crigler  6 to 8 months of left ankle pain Hurts at anterior ankle and dorsum of foot He has seen Dr. Lestine Box in the past who recommended a foot reconstruction on the right and possibly some fusion of the left ankle. However patient does not want to go through this procedure and would need to be off of smoking for a minimum of 3 months which he says he's been unable to do. He comes to see if there are other conservative things we can do that might help his foot and ankle issues  RA for years Joints with pain - ankles; left great toe;  Tarsal joints; 8 years on prednisone at one time with MTX Intermittent prednisone for COPD flares Now on Remicade  Ortho history Left shoulder replacement Left hip replacement These started after fall onto left side years ago However Dx was AVN  Carpal tunnel both sides Resection of MCP synovium Left knee arthroscopy  This is last pulmonary report on history: 67yo man childhood asthma, active smoker (50 pk-yrs), carries dx COPD made in the 90's. Hx of RA on MTX (since 90's) + remicade, OSA on CPAP, allergic rhinitis. On combivent prn, uses it frequently through the day. Also on Symbicort bid. Uses frequent rescue meds - usually albuterol/atrovent together.   Examination Alert and oriented x3 Moderately obese white male who is mildly short of breath with easy effort  Right ankle is subluxed laterally He has complete collapse of the longitudinal arch with no significant posterior tibialis function Standing he has marked pronation Walking gait on the right he turns the foot outward about 30 and has significant pronation Eversion about 20 and nonpainful He has lost any functional inversion of the right ankle Negative anterior drawer  Left ankle is in normal position with the foot pronated mildly but with almost complete loss of the longitudinal  arch Inversion and eversion are normal He has a positive anterior drawer with laxity Some tenderness to palpation over the dorsum of the foot near the navicular walking gait this foot shows moderate pronation

## 2012-03-31 NOTE — Assessment & Plan Note (Signed)
With adding additional arch padding to the right shoe where he has additional inserts we were able to correct the pronation or it is only about 15  The left ankle we were almost able to completely correct

## 2012-03-31 NOTE — Patient Instructions (Addendum)
Please try inserts with corrective padding  Use ankle compression sleeves for walking and standing  Please follow up in 3 months  Thank you for seeing [place] today!

## 2012-03-31 NOTE — Assessment & Plan Note (Signed)
I am concerned that he may have avascular necrosis of one of the tarsal bones in the left foot He is at much higher risk with his long-standing history of rheumatoid arthritis and his use of prednisone  He does have left ankle instability history of trauma I placed him in a full ankle compression sleeve to help with swelling in to lessen some of the stress of motion of the left ankle  The ankle shows significant swelling and is subluxed The compression sleeve does seem to give him some support on this side  We will get an x-ray of both ankles

## 2012-04-13 ENCOUNTER — Telehealth: Payer: Self-pay | Admitting: *Deleted

## 2012-04-13 NOTE — Telephone Encounter (Addendum)
Message copied by Mora Bellman on Wed Apr 13, 2012  4:52 PM ------      Message from: Enid Baas      Created: Wed Apr 13, 2012  4:46 PM       I don't have a good call back number.  A lot of arthritis in RT ankle and some on left.  No avascular necrosis seen.            Keep up treatment we started and we will follow up       ----- Message -----         From: Rad Results In Interface         Sent: 03/31/2012  12:40 PM           To: Enid Baas, MD                   ------ Spoke with pt- gave him x-ray results.

## 2012-05-05 DIAGNOSIS — M069 Rheumatoid arthritis, unspecified: Secondary | ICD-10-CM | POA: Diagnosis not present

## 2012-06-24 DIAGNOSIS — M069 Rheumatoid arthritis, unspecified: Secondary | ICD-10-CM | POA: Diagnosis not present

## 2012-07-06 DIAGNOSIS — M069 Rheumatoid arthritis, unspecified: Secondary | ICD-10-CM | POA: Diagnosis not present

## 2012-07-12 DIAGNOSIS — M19079 Primary osteoarthritis, unspecified ankle and foot: Secondary | ICD-10-CM | POA: Diagnosis not present

## 2012-08-05 DIAGNOSIS — M069 Rheumatoid arthritis, unspecified: Secondary | ICD-10-CM | POA: Diagnosis not present

## 2012-09-15 DIAGNOSIS — M069 Rheumatoid arthritis, unspecified: Secondary | ICD-10-CM | POA: Diagnosis not present

## 2012-09-15 DIAGNOSIS — E7800 Pure hypercholesterolemia, unspecified: Secondary | ICD-10-CM | POA: Diagnosis not present

## 2012-09-15 DIAGNOSIS — I10 Essential (primary) hypertension: Secondary | ICD-10-CM | POA: Diagnosis not present

## 2012-09-22 DIAGNOSIS — N289 Disorder of kidney and ureter, unspecified: Secondary | ICD-10-CM | POA: Diagnosis not present

## 2012-09-22 DIAGNOSIS — I10 Essential (primary) hypertension: Secondary | ICD-10-CM | POA: Diagnosis not present

## 2012-09-22 DIAGNOSIS — J45909 Unspecified asthma, uncomplicated: Secondary | ICD-10-CM | POA: Diagnosis not present

## 2012-09-22 DIAGNOSIS — Z006 Encounter for examination for normal comparison and control in clinical research program: Secondary | ICD-10-CM | POA: Diagnosis not present

## 2012-09-22 DIAGNOSIS — E7800 Pure hypercholesterolemia, unspecified: Secondary | ICD-10-CM | POA: Diagnosis not present

## 2012-09-27 DIAGNOSIS — K047 Periapical abscess without sinus: Secondary | ICD-10-CM | POA: Diagnosis not present

## 2012-09-27 DIAGNOSIS — H01009 Unspecified blepharitis unspecified eye, unspecified eyelid: Secondary | ICD-10-CM | POA: Diagnosis not present

## 2012-09-27 DIAGNOSIS — H00019 Hordeolum externum unspecified eye, unspecified eyelid: Secondary | ICD-10-CM | POA: Diagnosis not present

## 2012-10-04 DIAGNOSIS — H00019 Hordeolum externum unspecified eye, unspecified eyelid: Secondary | ICD-10-CM | POA: Diagnosis not present

## 2012-10-04 DIAGNOSIS — K047 Periapical abscess without sinus: Secondary | ICD-10-CM | POA: Diagnosis not present

## 2012-10-04 DIAGNOSIS — H01009 Unspecified blepharitis unspecified eye, unspecified eyelid: Secondary | ICD-10-CM | POA: Diagnosis not present

## 2012-10-25 DIAGNOSIS — M069 Rheumatoid arthritis, unspecified: Secondary | ICD-10-CM | POA: Diagnosis not present

## 2012-11-09 DIAGNOSIS — M79609 Pain in unspecified limb: Secondary | ICD-10-CM | POA: Diagnosis not present

## 2012-11-09 DIAGNOSIS — M069 Rheumatoid arthritis, unspecified: Secondary | ICD-10-CM | POA: Diagnosis not present

## 2012-11-09 DIAGNOSIS — Z23 Encounter for immunization: Secondary | ICD-10-CM | POA: Diagnosis not present

## 2012-11-11 ENCOUNTER — Other Ambulatory Visit: Payer: Self-pay | Admitting: Nephrology

## 2012-11-11 DIAGNOSIS — Z8349 Family history of other endocrine, nutritional and metabolic diseases: Secondary | ICD-10-CM | POA: Diagnosis not present

## 2012-11-11 DIAGNOSIS — N183 Chronic kidney disease, stage 3 unspecified: Secondary | ICD-10-CM

## 2012-11-11 DIAGNOSIS — N2581 Secondary hyperparathyroidism of renal origin: Secondary | ICD-10-CM | POA: Diagnosis not present

## 2012-11-17 ENCOUNTER — Ambulatory Visit
Admission: RE | Admit: 2012-11-17 | Discharge: 2012-11-17 | Disposition: A | Discharge status: Home / Self Care | Payer: Medicare Other | Source: Ambulatory Visit | Attending: Nephrology | Admitting: Nephrology

## 2012-11-17 DIAGNOSIS — N183 Chronic kidney disease, stage 3 unspecified: Secondary | ICD-10-CM

## 2012-11-17 DIAGNOSIS — I10 Essential (primary) hypertension: Secondary | ICD-10-CM | POA: Diagnosis not present

## 2012-12-07 DIAGNOSIS — H00019 Hordeolum externum unspecified eye, unspecified eyelid: Secondary | ICD-10-CM | POA: Diagnosis not present

## 2012-12-08 DIAGNOSIS — H0019 Chalazion unspecified eye, unspecified eyelid: Secondary | ICD-10-CM | POA: Diagnosis not present

## 2012-12-10 DIAGNOSIS — R9431 Abnormal electrocardiogram [ECG] [EKG]: Secondary | ICD-10-CM | POA: Diagnosis not present

## 2012-12-10 DIAGNOSIS — H8110 Benign paroxysmal vertigo, unspecified ear: Secondary | ICD-10-CM | POA: Diagnosis not present

## 2012-12-20 DIAGNOSIS — M069 Rheumatoid arthritis, unspecified: Secondary | ICD-10-CM | POA: Diagnosis not present

## 2013-01-31 DIAGNOSIS — M069 Rheumatoid arthritis, unspecified: Secondary | ICD-10-CM | POA: Diagnosis not present

## 2013-03-10 DIAGNOSIS — H2510 Age-related nuclear cataract, unspecified eye: Secondary | ICD-10-CM | POA: Diagnosis not present

## 2013-03-10 DIAGNOSIS — H524 Presbyopia: Secondary | ICD-10-CM | POA: Diagnosis not present

## 2013-03-10 DIAGNOSIS — H3589 Other specified retinal disorders: Secondary | ICD-10-CM | POA: Diagnosis not present

## 2013-03-10 DIAGNOSIS — H5200 Hypermetropia, unspecified eye: Secondary | ICD-10-CM | POA: Diagnosis not present

## 2013-03-16 DIAGNOSIS — M069 Rheumatoid arthritis, unspecified: Secondary | ICD-10-CM | POA: Diagnosis not present

## 2013-03-22 DIAGNOSIS — M069 Rheumatoid arthritis, unspecified: Secondary | ICD-10-CM | POA: Diagnosis not present

## 2013-03-22 DIAGNOSIS — M79609 Pain in unspecified limb: Secondary | ICD-10-CM | POA: Diagnosis not present

## 2013-03-23 DIAGNOSIS — Z7982 Long term (current) use of aspirin: Secondary | ICD-10-CM | POA: Diagnosis not present

## 2013-03-23 DIAGNOSIS — I10 Essential (primary) hypertension: Secondary | ICD-10-CM | POA: Diagnosis not present

## 2013-03-23 DIAGNOSIS — E7800 Pure hypercholesterolemia, unspecified: Secondary | ICD-10-CM | POA: Diagnosis not present

## 2013-03-23 DIAGNOSIS — Z0000 Encounter for general adult medical examination without abnormal findings: Secondary | ICD-10-CM | POA: Diagnosis not present

## 2013-03-23 DIAGNOSIS — Z125 Encounter for screening for malignant neoplasm of prostate: Secondary | ICD-10-CM | POA: Diagnosis not present

## 2013-03-23 DIAGNOSIS — Z23 Encounter for immunization: Secondary | ICD-10-CM | POA: Diagnosis not present

## 2013-03-29 DIAGNOSIS — M25579 Pain in unspecified ankle and joints of unspecified foot: Secondary | ICD-10-CM | POA: Diagnosis not present

## 2013-03-29 DIAGNOSIS — R059 Cough, unspecified: Secondary | ICD-10-CM | POA: Diagnosis not present

## 2013-03-29 DIAGNOSIS — M069 Rheumatoid arthritis, unspecified: Secondary | ICD-10-CM | POA: Diagnosis not present

## 2013-03-29 DIAGNOSIS — R05 Cough: Secondary | ICD-10-CM | POA: Diagnosis not present

## 2013-03-29 DIAGNOSIS — J449 Chronic obstructive pulmonary disease, unspecified: Secondary | ICD-10-CM | POA: Diagnosis not present

## 2013-03-29 DIAGNOSIS — I452 Bifascicular block: Secondary | ICD-10-CM | POA: Diagnosis not present

## 2013-03-29 DIAGNOSIS — Z1212 Encounter for screening for malignant neoplasm of rectum: Secondary | ICD-10-CM | POA: Diagnosis not present

## 2013-04-08 DIAGNOSIS — S82839A Other fracture of upper and lower end of unspecified fibula, initial encounter for closed fracture: Secondary | ICD-10-CM | POA: Diagnosis not present

## 2013-04-10 DIAGNOSIS — M25569 Pain in unspecified knee: Secondary | ICD-10-CM | POA: Diagnosis not present

## 2013-04-10 DIAGNOSIS — IMO0002 Reserved for concepts with insufficient information to code with codable children: Secondary | ICD-10-CM | POA: Diagnosis not present

## 2013-04-19 DIAGNOSIS — IMO0002 Reserved for concepts with insufficient information to code with codable children: Secondary | ICD-10-CM | POA: Diagnosis not present

## 2013-04-27 DIAGNOSIS — M069 Rheumatoid arthritis, unspecified: Secondary | ICD-10-CM | POA: Diagnosis not present

## 2013-04-28 DIAGNOSIS — IMO0002 Reserved for concepts with insufficient information to code with codable children: Secondary | ICD-10-CM | POA: Diagnosis not present

## 2013-05-11 DIAGNOSIS — IMO0002 Reserved for concepts with insufficient information to code with codable children: Secondary | ICD-10-CM | POA: Diagnosis not present

## 2013-05-11 DIAGNOSIS — M25569 Pain in unspecified knee: Secondary | ICD-10-CM | POA: Diagnosis not present

## 2013-05-11 DIAGNOSIS — S82839A Other fracture of upper and lower end of unspecified fibula, initial encounter for closed fracture: Secondary | ICD-10-CM | POA: Diagnosis not present

## 2013-06-02 DIAGNOSIS — H8110 Benign paroxysmal vertigo, unspecified ear: Secondary | ICD-10-CM | POA: Diagnosis not present

## 2013-06-05 DIAGNOSIS — R42 Dizziness and giddiness: Secondary | ICD-10-CM | POA: Diagnosis not present

## 2013-06-05 DIAGNOSIS — H9190 Unspecified hearing loss, unspecified ear: Secondary | ICD-10-CM | POA: Diagnosis not present

## 2013-06-07 ENCOUNTER — Ambulatory Visit: Payer: Medicare Other | Attending: Internal Medicine | Admitting: Physical Therapy

## 2013-06-07 DIAGNOSIS — H8110 Benign paroxysmal vertigo, unspecified ear: Secondary | ICD-10-CM | POA: Insufficient documentation

## 2013-06-07 DIAGNOSIS — IMO0001 Reserved for inherently not codable concepts without codable children: Secondary | ICD-10-CM | POA: Insufficient documentation

## 2013-06-08 DIAGNOSIS — M069 Rheumatoid arthritis, unspecified: Secondary | ICD-10-CM | POA: Diagnosis not present

## 2013-06-09 ENCOUNTER — Ambulatory Visit: Payer: Medicare Other | Attending: Internal Medicine | Admitting: Physical Therapy

## 2013-06-09 DIAGNOSIS — IMO0001 Reserved for inherently not codable concepts without codable children: Secondary | ICD-10-CM | POA: Insufficient documentation

## 2013-06-09 DIAGNOSIS — H8110 Benign paroxysmal vertigo, unspecified ear: Secondary | ICD-10-CM | POA: Insufficient documentation

## 2013-06-14 ENCOUNTER — Ambulatory Visit: Payer: Medicare Other | Admitting: Physical Therapy

## 2013-06-16 ENCOUNTER — Encounter: Payer: Medicare Other | Admitting: Physical Therapy

## 2013-06-21 DIAGNOSIS — R42 Dizziness and giddiness: Secondary | ICD-10-CM | POA: Diagnosis not present

## 2013-06-21 DIAGNOSIS — H903 Sensorineural hearing loss, bilateral: Secondary | ICD-10-CM | POA: Diagnosis not present

## 2013-07-20 DIAGNOSIS — M069 Rheumatoid arthritis, unspecified: Secondary | ICD-10-CM | POA: Diagnosis not present

## 2013-07-27 DIAGNOSIS — M069 Rheumatoid arthritis, unspecified: Secondary | ICD-10-CM | POA: Diagnosis not present

## 2013-07-27 DIAGNOSIS — M79609 Pain in unspecified limb: Secondary | ICD-10-CM | POA: Diagnosis not present

## 2013-08-31 DIAGNOSIS — M069 Rheumatoid arthritis, unspecified: Secondary | ICD-10-CM | POA: Diagnosis not present

## 2013-09-13 DIAGNOSIS — M069 Rheumatoid arthritis, unspecified: Secondary | ICD-10-CM | POA: Diagnosis not present

## 2013-09-13 DIAGNOSIS — M79609 Pain in unspecified limb: Secondary | ICD-10-CM | POA: Diagnosis not present

## 2013-09-18 ENCOUNTER — Other Ambulatory Visit (HOSPITAL_COMMUNITY): Payer: Self-pay | Admitting: Respiratory Therapy

## 2013-09-18 DIAGNOSIS — J441 Chronic obstructive pulmonary disease with (acute) exacerbation: Secondary | ICD-10-CM

## 2013-09-21 DIAGNOSIS — M069 Rheumatoid arthritis, unspecified: Secondary | ICD-10-CM | POA: Diagnosis not present

## 2013-09-21 DIAGNOSIS — E7800 Pure hypercholesterolemia, unspecified: Secondary | ICD-10-CM | POA: Diagnosis not present

## 2013-09-21 DIAGNOSIS — N289 Disorder of kidney and ureter, unspecified: Secondary | ICD-10-CM | POA: Diagnosis not present

## 2013-09-21 DIAGNOSIS — I10 Essential (primary) hypertension: Secondary | ICD-10-CM | POA: Diagnosis not present

## 2013-09-27 ENCOUNTER — Encounter (HOSPITAL_COMMUNITY): Payer: Medicare Other

## 2013-10-05 ENCOUNTER — Encounter (HOSPITAL_COMMUNITY): Payer: Medicare Other

## 2013-10-10 ENCOUNTER — Ambulatory Visit (HOSPITAL_COMMUNITY)
Admission: RE | Admit: 2013-10-10 | Discharge: 2013-10-10 | Disposition: A | Discharge status: Home / Self Care | Payer: Medicare Other | Source: Ambulatory Visit | Attending: Internal Medicine | Admitting: Internal Medicine

## 2013-10-10 DIAGNOSIS — F17200 Nicotine dependence, unspecified, uncomplicated: Secondary | ICD-10-CM | POA: Insufficient documentation

## 2013-10-10 DIAGNOSIS — J441 Chronic obstructive pulmonary disease with (acute) exacerbation: Secondary | ICD-10-CM | POA: Diagnosis not present

## 2013-10-10 LAB — PULMONARY FUNCTION TEST
DL/VA % pred: 86 %
DL/VA: 3.83 ml/min/mmHg/L
DLCO cor % pred: 70 %
DLCO cor: 19.85 ml/min/mmHg
DLCO unc % pred: 70 %
DLCO unc: 19.85 ml/min/mmHg
FEF 25-75 Post: 0.79 L/sec
FEF 25-75 Pre: 0.49 L/sec
FEF2575-%Change-Post: 62 %
FEF2575-%Pred-Post: 34 %
FEF2575-%Pred-Pre: 21 %
FEV1-%Change-Post: 11 %
FEV1-%Pred-Post: 53 %
FEV1-%Pred-Pre: 48 %
FEV1-Post: 1.58 L
FEV1-Pre: 1.42 L
FEV1FVC-%Change-Post: -1 %
FEV1FVC-%Pred-Pre: 69 %
FEV6-%Change-Post: 11 %
FEV6-%Pred-Post: 72 %
FEV6-%Pred-Pre: 65 %
FEV6-Post: 2.72 L
FEV6-Pre: 2.44 L
FEV6FVC-%Change-Post: 0 %
FEV6FVC-%Pred-Post: 93 %
FEV6FVC-%Pred-Pre: 94 %
FVC-%Change-Post: 12 %
FVC-%Pred-Post: 77 %
FVC-%Pred-Pre: 69 %
FVC-Post: 3.1 L
FVC-Pre: 2.76 L
Post FEV1/FVC ratio: 51 %
Post FEV6/FVC ratio: 88 %
Pre FEV1/FVC ratio: 52 %
Pre FEV6/FVC Ratio: 89 %
RV % pred: 135 %
RV: 3.05 L
TLC % pred: 97 %
TLC: 6.24 L

## 2013-10-10 MED ORDER — ALBUTEROL SULFATE (2.5 MG/3ML) 0.083% IN NEBU
2.5000 mg | INHALATION_SOLUTION | Freq: Once | RESPIRATORY_TRACT | Status: AC
  Administered 2013-10-10: 2.5 mg via RESPIRATORY_TRACT

## 2013-10-12 DIAGNOSIS — M069 Rheumatoid arthritis, unspecified: Secondary | ICD-10-CM | POA: Diagnosis not present

## 2013-10-23 DIAGNOSIS — R609 Edema, unspecified: Secondary | ICD-10-CM | POA: Diagnosis not present

## 2013-10-23 DIAGNOSIS — N289 Disorder of kidney and ureter, unspecified: Secondary | ICD-10-CM | POA: Diagnosis not present

## 2013-10-23 DIAGNOSIS — I10 Essential (primary) hypertension: Secondary | ICD-10-CM | POA: Diagnosis not present

## 2013-11-21 DIAGNOSIS — M0579 Rheumatoid arthritis with rheumatoid factor of multiple sites without organ or systems involvement: Secondary | ICD-10-CM | POA: Diagnosis not present

## 2013-11-21 DIAGNOSIS — M0589 Other rheumatoid arthritis with rheumatoid factor of multiple sites: Secondary | ICD-10-CM | POA: Diagnosis not present

## 2013-11-21 DIAGNOSIS — Z23 Encounter for immunization: Secondary | ICD-10-CM | POA: Diagnosis not present

## 2013-11-23 DIAGNOSIS — G4730 Sleep apnea, unspecified: Secondary | ICD-10-CM | POA: Diagnosis not present

## 2013-11-23 DIAGNOSIS — K219 Gastro-esophageal reflux disease without esophagitis: Secondary | ICD-10-CM | POA: Diagnosis not present

## 2013-11-23 DIAGNOSIS — Z1211 Encounter for screening for malignant neoplasm of colon: Secondary | ICD-10-CM | POA: Diagnosis not present

## 2013-11-23 DIAGNOSIS — J449 Chronic obstructive pulmonary disease, unspecified: Secondary | ICD-10-CM | POA: Diagnosis not present

## 2013-11-29 DIAGNOSIS — M1A09X0 Idiopathic chronic gout, multiple sites, without tophus (tophi): Secondary | ICD-10-CM | POA: Diagnosis not present

## 2013-11-29 DIAGNOSIS — M0589 Other rheumatoid arthritis with rheumatoid factor of multiple sites: Secondary | ICD-10-CM | POA: Diagnosis not present

## 2013-11-29 DIAGNOSIS — N183 Chronic kidney disease, stage 3 (moderate): Secondary | ICD-10-CM | POA: Diagnosis not present

## 2013-12-11 DIAGNOSIS — M542 Cervicalgia: Secondary | ICD-10-CM | POA: Diagnosis not present

## 2013-12-11 DIAGNOSIS — M0589 Other rheumatoid arthritis with rheumatoid factor of multiple sites: Secondary | ICD-10-CM | POA: Diagnosis not present

## 2013-12-11 DIAGNOSIS — M47812 Spondylosis without myelopathy or radiculopathy, cervical region: Secondary | ICD-10-CM | POA: Diagnosis not present

## 2013-12-11 DIAGNOSIS — M1A09X0 Idiopathic chronic gout, multiple sites, without tophus (tophi): Secondary | ICD-10-CM | POA: Diagnosis not present

## 2013-12-11 DIAGNOSIS — N183 Chronic kidney disease, stage 3 (moderate): Secondary | ICD-10-CM | POA: Diagnosis not present

## 2013-12-19 DIAGNOSIS — M542 Cervicalgia: Secondary | ICD-10-CM | POA: Diagnosis not present

## 2013-12-19 DIAGNOSIS — N183 Chronic kidney disease, stage 3 (moderate): Secondary | ICD-10-CM | POA: Diagnosis not present

## 2013-12-19 DIAGNOSIS — M1A09X0 Idiopathic chronic gout, multiple sites, without tophus (tophi): Secondary | ICD-10-CM | POA: Diagnosis not present

## 2013-12-19 DIAGNOSIS — M0589 Other rheumatoid arthritis with rheumatoid factor of multiple sites: Secondary | ICD-10-CM | POA: Diagnosis not present

## 2014-01-02 DIAGNOSIS — M0589 Other rheumatoid arthritis with rheumatoid factor of multiple sites: Secondary | ICD-10-CM | POA: Diagnosis not present

## 2014-02-15 DIAGNOSIS — M0589 Other rheumatoid arthritis with rheumatoid factor of multiple sites: Secondary | ICD-10-CM | POA: Diagnosis not present

## 2014-03-29 DIAGNOSIS — M0589 Other rheumatoid arthritis with rheumatoid factor of multiple sites: Secondary | ICD-10-CM | POA: Diagnosis not present

## 2014-04-12 DIAGNOSIS — M542 Cervicalgia: Secondary | ICD-10-CM | POA: Diagnosis not present

## 2014-04-12 DIAGNOSIS — M0589 Other rheumatoid arthritis with rheumatoid factor of multiple sites: Secondary | ICD-10-CM | POA: Diagnosis not present

## 2014-04-12 DIAGNOSIS — M1A09X0 Idiopathic chronic gout, multiple sites, without tophus (tophi): Secondary | ICD-10-CM | POA: Diagnosis not present

## 2014-04-12 DIAGNOSIS — N183 Chronic kidney disease, stage 3 (moderate): Secondary | ICD-10-CM | POA: Diagnosis not present

## 2014-04-23 DIAGNOSIS — Z0000 Encounter for general adult medical examination without abnormal findings: Secondary | ICD-10-CM | POA: Diagnosis not present

## 2014-04-23 DIAGNOSIS — Z23 Encounter for immunization: Secondary | ICD-10-CM | POA: Diagnosis not present

## 2014-04-23 DIAGNOSIS — I10 Essential (primary) hypertension: Secondary | ICD-10-CM | POA: Diagnosis not present

## 2014-04-23 DIAGNOSIS — Z125 Encounter for screening for malignant neoplasm of prostate: Secondary | ICD-10-CM | POA: Diagnosis not present

## 2014-04-23 DIAGNOSIS — E780 Pure hypercholesterolemia: Secondary | ICD-10-CM | POA: Diagnosis not present

## 2014-04-23 DIAGNOSIS — K219 Gastro-esophageal reflux disease without esophagitis: Secondary | ICD-10-CM | POA: Diagnosis not present

## 2014-05-01 DIAGNOSIS — Z1212 Encounter for screening for malignant neoplasm of rectum: Secondary | ICD-10-CM | POA: Diagnosis not present

## 2014-05-01 DIAGNOSIS — E669 Obesity, unspecified: Secondary | ICD-10-CM | POA: Diagnosis not present

## 2014-05-01 DIAGNOSIS — I10 Essential (primary) hypertension: Secondary | ICD-10-CM | POA: Diagnosis not present

## 2014-05-01 DIAGNOSIS — Z720 Tobacco use: Secondary | ICD-10-CM | POA: Diagnosis not present

## 2014-05-02 ENCOUNTER — Other Ambulatory Visit: Payer: Self-pay | Admitting: Internal Medicine

## 2014-05-02 DIAGNOSIS — Z136 Encounter for screening for cardiovascular disorders: Secondary | ICD-10-CM

## 2014-05-07 DIAGNOSIS — G4733 Obstructive sleep apnea (adult) (pediatric): Secondary | ICD-10-CM | POA: Diagnosis not present

## 2014-05-08 ENCOUNTER — Ambulatory Visit
Admission: RE | Admit: 2014-05-08 | Discharge: 2014-05-08 | Disposition: A | Discharge status: Home / Self Care | Payer: Medicare Other | Source: Ambulatory Visit | Attending: Internal Medicine | Admitting: Internal Medicine

## 2014-05-08 DIAGNOSIS — Z136 Encounter for screening for cardiovascular disorders: Secondary | ICD-10-CM

## 2014-05-08 DIAGNOSIS — I77811 Abdominal aortic ectasia: Secondary | ICD-10-CM | POA: Diagnosis not present

## 2014-05-10 DIAGNOSIS — M0589 Other rheumatoid arthritis with rheumatoid factor of multiple sites: Secondary | ICD-10-CM | POA: Diagnosis not present

## 2014-05-17 DIAGNOSIS — H903 Sensorineural hearing loss, bilateral: Secondary | ICD-10-CM | POA: Diagnosis not present

## 2014-06-13 ENCOUNTER — Encounter: Payer: Self-pay | Admitting: Vascular Surgery

## 2014-06-21 DIAGNOSIS — M0589 Other rheumatoid arthritis with rheumatoid factor of multiple sites: Secondary | ICD-10-CM | POA: Diagnosis not present

## 2014-06-29 ENCOUNTER — Encounter: Payer: Self-pay | Admitting: Vascular Surgery

## 2014-07-03 ENCOUNTER — Ambulatory Visit (INDEPENDENT_AMBULATORY_CARE_PROVIDER_SITE_OTHER): Payer: Medicare Other | Admitting: Vascular Surgery

## 2014-07-03 ENCOUNTER — Encounter: Payer: Self-pay | Admitting: Vascular Surgery

## 2014-07-03 VITALS — BP 127/78 | HR 105 | Resp 20 | Ht 67.0 in | Wt 263.7 lb

## 2014-07-03 DIAGNOSIS — I714 Abdominal aortic aneurysm, without rupture, unspecified: Secondary | ICD-10-CM

## 2014-07-03 NOTE — Progress Notes (Signed)
HISTORY AND PHYSICAL   History of Present Illness:  Patient is a 69 y.o. year old male who presents for evaluation of abdominal aortic aneurysm after an ultrasound screening.  The aneurysm is currently 2.7 cm in diameter by US performed at [facility] on 05/08/2014.  The patient denies abdominal pain.  The patient denies back pain.  The patient has no family history of AAA.  Past medical history includes: tobacco abuse emphysema, hypertension managed with maxzide, and CKD stage III.  He denise hyperlipidemia.  Past Medical History  Diagnosis Date  . Emphysema   . Asthma   . Sleep apnea     On CPAP  . Hypertension   . AAA (abdominal aortic aneurysm)   . Arthritis     Rhuematoid  . Obstructive sleep apnea   . Chronic renal insufficiency   . GERD (gastroesophageal reflux disease)   . Hearing loss of both ears   . Nephrolithiasis   . Hiatal hernia   . Diverticulosis   . Male hypogonadism   . Allergy     Rhinitis  . Varicose veins   . Vitamin B 12 deficiency   . Stasis dermatitis   . Hyperlipidemia     Past Surgical History  Procedure Laterality Date  . Carpal tunnel release      bilateral  . Kidney stone surgery  2003  . Left shoulder    . Left hip replacement    . Left knee    . Lithotripsy    . Joint replacement Left     Shoulder  . Joint replacement Left     Hip   . Fracture surgery Left     Fibula     Social History History  Substance Use Topics  . Smoking status: Current Every Day Smoker -- 1.00 packs/day for 48 years    Types: E-cigarettes  . Smokeless tobacco: Never Used  . Alcohol Use: 0.0 oz/week    0 Standard drinks or equivalent per week    Family History Family History  Problem Relation Age of Onset  . Emphysema Father   . Asthma Father   . Osteoarthritis Father   . Heart attack Mother   . Uterine cancer Mother   . Heart disease Mother   . ADD / ADHD Son   . Depression Son   . Hypertension Brother   . Heart disease Brother    Pacemaker   . Heart murmur Brother     Allergies  Allergies  Allergen Reactions  . Chantix [Varenicline] Other (See Comments)    Suicidal thoughts     Current Outpatient Prescriptions  Medication Sig Dispense Refill  . albuterol (PROVENTIL) (2.5 MG/3ML) 0.083% nebulizer solution Twice daily and asneeded    . albuterol-ipratropium (COMBIVENT) 18-103 MCG/ACT inhaler Inhale 2 puffs into the lungs every 6 (six) hours as needed.      Marland Kitchen aspirin 81 MG tablet 160 mg. 2 by mouth daily     . Calcium-Magnesium-Vitamin D (CITRACAL CALCIUM+D) 600-40-500 MG-MG-UNIT TB24 Take 1 tablet by mouth daily.      . cetirizine (ZYRTEC) 10 MG tablet Take 10 mg by mouth daily.      . cyanocobalamin 2000 MCG tablet Take 2,500 mcg by mouth daily.     . febuxostat (ULORIC) 40 MG tablet Take 40 mg by mouth daily.    . folic acid (FOLVITE) 800 MCG tablet Take 400 mcg by mouth daily.      Marland Kitchen guaiFENesin (MUCINEX) 600 MG 12 hr tablet  Take 600 mg by mouth 2 (two) times daily as needed.     . InFLIXimab (REMICADE IV) Inject into the vein every 6 (six) weeks.    Marland Kitchen ipratropium (ATROVENT) 0.02 % nebulizer solution Twice daily and asneeded    . methotrexate (RHEUMATREX) 2.5 MG tablet 4 tablets weekly    . Multiple Vitamin (MULTIVITAMIN) tablet Take 1 tablet by mouth daily.      . Pantoprazole Sodium (PROTONIX PO) Take by mouth.    . SYMBICORT 160-4.5 MCG/ACT inhaler 2 puffs twice daily    . triamterene-hydrochlorothiazide (MAXZIDE-25) 37.5-25 MG per tablet Once daily    . albuterol (PROVENTIL HFA;VENTOLIN HFA) 108 (90 BASE) MCG/ACT inhaler Inhale 2 puffs into the lungs every 4 (four) hours as needed for wheezing. 1 Inhaler 6   No current facility-administered medications for this visit.    ROS:   General:  No weight loss, Fever, chills  HEENT: No recent headaches, no nasal bleeding, no visual changes, no sore throat  Neurologic: No dizziness, blackouts, seizures. No recent symptoms of stroke or mini- stroke. No  recent episodes of slurred speech, or temporary blindness.  Cardiac: No recent episodes of chest pain/pressure, no shortness of breath at rest.  No shortness of breath with exertion.  Denies history of atrial fibrillation or irregular heartbeat  Vascular: No history of rest pain in feet.  No history of claudication.  No history of non-healing ulcer, No history of DVT   Pulmonary: No home oxygen, positive productive cough, no hemoptysis,  positive asthma or wheezing  Musculoskeletal:  [ ]  Arthritis, [ ]  Low back pain,  [ ]  Joint pain  Hematologic:No history of hypercoagulable state.  No history of easy bleeding.  No history of anemia  Gastrointestinal: No hematochezia or melena,  No gastroesophageal reflux, no trouble swallowing  Urinary: [x ] chronic Kidney disease, [ ]  on HD - [ ]  MWF or [ ]  TTHS, [ ]  Burning with urination, [ ]  Frequent urination, [ ]  Difficulty urinating;   Skin: No rashes  Psychological: No history of anxiety,  No history of depression   Physical Examination  Filed Vitals:   07/03/14 1436  BP: 127/78  Pulse: 105  Resp: 20  Height: 5\' 7"  (1.702 m)  Weight: 263 lb 11.2 oz (119.614 kg)    Body mass index is 41.29 kg/(m^2).  General:  Alert and oriented, no acute distress, obese  HEENT: Normal Neck: No bruit or JVD Pulmonary: Bil upper and lower lobe wheezing to auscultation bilaterally Cardiac: Regular Rate and Rhythm without murmur Gastrointestinal: Soft, non-tender, non-distended, no mass, no scars Skin: No rash Extremity Pulses:  2+ radial, brachial, femoral, dorsalis pedis, posterior tibial pulses bilaterally Musculoskeletal: No deformity or edema  Neurologic: Upper and lower extremity motor 5/5 and symmetric  DATA:  ABDOMINAL AORTA SCREENING ULTRASOUND IMPRESSION: Mild abdominal aortic ectasia 2.7 cm. This was suboptimal exam due to body habitus and bowel gas. Ectatic abdominal aorta at risk for aneurysm development. Recommend followup by  ultrasound in 5 years. This recommendation follows ACR consensus guidelines: White Paper of the ACR Incidental Findings Committee II on Vascular Findings. J Am Coll Radiol 2013; 10:789-794.  ASSESSMENT:  Asymptomatic abdominal aortic mild enlargement 2.7 cm    PLAN: He is at very low risk for enlargement of his aorta the size of a normal aorta is " 2.0 cm " to consider a surgical intervention would be greater than 5.5 cm aneurysm.   He will be scheduled for a follow up AAA ultrasound  in 1 year.   Thomasena Edis, Rafaelita Foister [facility] PA-C [facility] [place] Office: [phone]  The patient was seen in conjunction with Dr. Arbie Cookey today  I have examined the patient, reviewed and agree with above. Patient is quite anxious regarding this new diagnosis of aneurysm. Explained that this barely reaches the level of diagnosis of aortic ectasia versus small aneurysm. Explained that it would be very little lifelong chance that this will require treatment. Did discuss options for open repair versus stent graft repair if the time comes but this would be highly unlikely. We'll see him again in one year with ultrasound surveillance  Gretta Began, MD 07/03/2014 3:49 PM

## 2014-08-02 DIAGNOSIS — M0589 Other rheumatoid arthritis with rheumatoid factor of multiple sites: Secondary | ICD-10-CM | POA: Diagnosis not present

## 2014-09-13 DIAGNOSIS — N183 Chronic kidney disease, stage 3 (moderate): Secondary | ICD-10-CM | POA: Diagnosis not present

## 2014-09-13 DIAGNOSIS — M1A09X0 Idiopathic chronic gout, multiple sites, without tophus (tophi): Secondary | ICD-10-CM | POA: Diagnosis not present

## 2014-09-13 DIAGNOSIS — M0589 Other rheumatoid arthritis with rheumatoid factor of multiple sites: Secondary | ICD-10-CM | POA: Diagnosis not present

## 2014-09-13 DIAGNOSIS — M542 Cervicalgia: Secondary | ICD-10-CM | POA: Diagnosis not present

## 2014-09-18 DIAGNOSIS — M0589 Other rheumatoid arthritis with rheumatoid factor of multiple sites: Secondary | ICD-10-CM | POA: Diagnosis not present

## 2014-10-30 DIAGNOSIS — M0589 Other rheumatoid arthritis with rheumatoid factor of multiple sites: Secondary | ICD-10-CM | POA: Diagnosis not present

## 2014-11-01 DIAGNOSIS — E291 Testicular hypofunction: Secondary | ICD-10-CM | POA: Diagnosis not present

## 2014-11-01 DIAGNOSIS — I10 Essential (primary) hypertension: Secondary | ICD-10-CM | POA: Diagnosis not present

## 2014-11-01 DIAGNOSIS — Z125 Encounter for screening for malignant neoplasm of prostate: Secondary | ICD-10-CM | POA: Diagnosis not present

## 2014-11-01 DIAGNOSIS — E780 Pure hypercholesterolemia: Secondary | ICD-10-CM | POA: Diagnosis not present

## 2014-11-08 DIAGNOSIS — I10 Essential (primary) hypertension: Secondary | ICD-10-CM | POA: Diagnosis not present

## 2014-11-08 DIAGNOSIS — Z23 Encounter for immunization: Secondary | ICD-10-CM | POA: Diagnosis not present

## 2014-11-08 DIAGNOSIS — E669 Obesity, unspecified: Secondary | ICD-10-CM | POA: Diagnosis not present

## 2014-11-08 DIAGNOSIS — F17200 Nicotine dependence, unspecified, uncomplicated: Secondary | ICD-10-CM | POA: Diagnosis not present

## 2014-11-08 DIAGNOSIS — K219 Gastro-esophageal reflux disease without esophagitis: Secondary | ICD-10-CM | POA: Diagnosis not present

## 2014-12-12 DIAGNOSIS — M1A09X0 Idiopathic chronic gout, multiple sites, without tophus (tophi): Secondary | ICD-10-CM | POA: Diagnosis not present

## 2014-12-12 DIAGNOSIS — M0589 Other rheumatoid arthritis with rheumatoid factor of multiple sites: Secondary | ICD-10-CM | POA: Diagnosis not present

## 2014-12-13 DIAGNOSIS — M1A09X0 Idiopathic chronic gout, multiple sites, without tophus (tophi): Secondary | ICD-10-CM | POA: Diagnosis not present

## 2014-12-13 DIAGNOSIS — M542 Cervicalgia: Secondary | ICD-10-CM | POA: Diagnosis not present

## 2014-12-13 DIAGNOSIS — M0589 Other rheumatoid arthritis with rheumatoid factor of multiple sites: Secondary | ICD-10-CM | POA: Diagnosis not present

## 2014-12-13 DIAGNOSIS — N183 Chronic kidney disease, stage 3 (moderate): Secondary | ICD-10-CM | POA: Diagnosis not present

## 2014-12-31 DIAGNOSIS — K5730 Diverticulosis of large intestine without perforation or abscess without bleeding: Secondary | ICD-10-CM | POA: Diagnosis not present

## 2014-12-31 DIAGNOSIS — Z1211 Encounter for screening for malignant neoplasm of colon: Secondary | ICD-10-CM | POA: Diagnosis not present

## 2015-01-03 DIAGNOSIS — J101 Influenza due to other identified influenza virus with other respiratory manifestations: Secondary | ICD-10-CM | POA: Diagnosis not present

## 2015-01-03 DIAGNOSIS — J441 Chronic obstructive pulmonary disease with (acute) exacerbation: Secondary | ICD-10-CM | POA: Diagnosis not present

## 2015-01-03 DIAGNOSIS — R509 Fever, unspecified: Secondary | ICD-10-CM | POA: Diagnosis not present

## 2015-01-24 DIAGNOSIS — M0589 Other rheumatoid arthritis with rheumatoid factor of multiple sites: Secondary | ICD-10-CM | POA: Diagnosis not present

## 2015-01-24 DIAGNOSIS — Z79899 Other long term (current) drug therapy: Secondary | ICD-10-CM | POA: Diagnosis not present

## 2015-03-07 DIAGNOSIS — M0589 Other rheumatoid arthritis with rheumatoid factor of multiple sites: Secondary | ICD-10-CM | POA: Diagnosis not present

## 2015-04-11 DIAGNOSIS — M0589 Other rheumatoid arthritis with rheumatoid factor of multiple sites: Secondary | ICD-10-CM | POA: Diagnosis not present

## 2015-04-11 DIAGNOSIS — M542 Cervicalgia: Secondary | ICD-10-CM | POA: Diagnosis not present

## 2015-04-11 DIAGNOSIS — M1A09X0 Idiopathic chronic gout, multiple sites, without tophus (tophi): Secondary | ICD-10-CM | POA: Diagnosis not present

## 2015-04-11 DIAGNOSIS — N183 Chronic kidney disease, stage 3 (moderate): Secondary | ICD-10-CM | POA: Diagnosis not present

## 2015-04-29 DIAGNOSIS — Z125 Encounter for screening for malignant neoplasm of prostate: Secondary | ICD-10-CM | POA: Diagnosis not present

## 2015-04-29 DIAGNOSIS — Z0000 Encounter for general adult medical examination without abnormal findings: Secondary | ICD-10-CM | POA: Diagnosis not present

## 2015-04-29 DIAGNOSIS — G4733 Obstructive sleep apnea (adult) (pediatric): Secondary | ICD-10-CM | POA: Diagnosis not present

## 2015-04-29 DIAGNOSIS — E7800 Pure hypercholesterolemia, unspecified: Secondary | ICD-10-CM | POA: Diagnosis not present

## 2015-04-29 DIAGNOSIS — I10 Essential (primary) hypertension: Secondary | ICD-10-CM | POA: Diagnosis not present

## 2015-05-02 DIAGNOSIS — Z79899 Other long term (current) drug therapy: Secondary | ICD-10-CM | POA: Diagnosis not present

## 2015-05-02 DIAGNOSIS — M0589 Other rheumatoid arthritis with rheumatoid factor of multiple sites: Secondary | ICD-10-CM | POA: Diagnosis not present

## 2015-05-07 DIAGNOSIS — K219 Gastro-esophageal reflux disease without esophagitis: Secondary | ICD-10-CM | POA: Diagnosis not present

## 2015-05-07 DIAGNOSIS — Z1212 Encounter for screening for malignant neoplasm of rectum: Secondary | ICD-10-CM | POA: Diagnosis not present

## 2015-05-07 DIAGNOSIS — J4520 Mild intermittent asthma, uncomplicated: Secondary | ICD-10-CM | POA: Diagnosis not present

## 2015-05-07 DIAGNOSIS — L820 Inflamed seborrheic keratosis: Secondary | ICD-10-CM | POA: Diagnosis not present

## 2015-05-07 DIAGNOSIS — I10 Essential (primary) hypertension: Secondary | ICD-10-CM | POA: Diagnosis not present

## 2015-05-07 DIAGNOSIS — G4733 Obstructive sleep apnea (adult) (pediatric): Secondary | ICD-10-CM | POA: Diagnosis not present

## 2015-06-13 DIAGNOSIS — M0589 Other rheumatoid arthritis with rheumatoid factor of multiple sites: Secondary | ICD-10-CM | POA: Diagnosis not present

## 2015-07-03 ENCOUNTER — Encounter: Payer: Self-pay | Admitting: Family

## 2015-07-05 ENCOUNTER — Other Ambulatory Visit: Payer: Self-pay | Admitting: *Deleted

## 2015-07-05 DIAGNOSIS — I714 Abdominal aortic aneurysm, without rupture, unspecified: Secondary | ICD-10-CM

## 2015-07-09 ENCOUNTER — Ambulatory Visit (INDEPENDENT_AMBULATORY_CARE_PROVIDER_SITE_OTHER): Payer: Medicare Other | Admitting: Family

## 2015-07-09 ENCOUNTER — Encounter: Payer: Self-pay | Admitting: Family

## 2015-07-09 ENCOUNTER — Ambulatory Visit (HOSPITAL_COMMUNITY)
Admission: RE | Admit: 2015-07-09 | Discharge: 2015-07-09 | Disposition: A | Discharge status: Home / Self Care | Payer: Medicare Other | Source: Ambulatory Visit | Attending: Family | Admitting: Family

## 2015-07-09 VITALS — BP 104/70 | HR 90 | Temp 97.5°F | Resp 26 | Ht 67.0 in | Wt 249.0 lb

## 2015-07-09 DIAGNOSIS — E785 Hyperlipidemia, unspecified: Secondary | ICD-10-CM | POA: Insufficient documentation

## 2015-07-09 DIAGNOSIS — K219 Gastro-esophageal reflux disease without esophagitis: Secondary | ICD-10-CM | POA: Insufficient documentation

## 2015-07-09 DIAGNOSIS — I714 Abdominal aortic aneurysm, without rupture, unspecified: Secondary | ICD-10-CM

## 2015-07-09 DIAGNOSIS — Z720 Tobacco use: Secondary | ICD-10-CM

## 2015-07-09 DIAGNOSIS — F17200 Nicotine dependence, unspecified, uncomplicated: Secondary | ICD-10-CM

## 2015-07-09 DIAGNOSIS — G4733 Obstructive sleep apnea (adult) (pediatric): Secondary | ICD-10-CM | POA: Diagnosis not present

## 2015-07-09 DIAGNOSIS — I872 Venous insufficiency (chronic) (peripheral): Secondary | ICD-10-CM

## 2015-07-09 DIAGNOSIS — I10 Essential (primary) hypertension: Secondary | ICD-10-CM | POA: Diagnosis not present

## 2015-07-09 NOTE — Progress Notes (Addendum)
[facility] [place]   CC: Follow up Abdominal Aortic Aneurysm  History of Present Illness  Chad Duke is a 70 y.o. (April 13, 1945) male patient of Dr. Arbie Cookey who presents for follow up evaluation of abdominal aortic aneurysm which was originally found after an ultrasound screening.  The aneurysm was 2.7 cm in diameter by US performed at [facility] on 05/08/2014.The patient has no family history of AAA. Past medical history includes: tobacco abuse, emphysema, hypertension managed with maxzide, and CKD stage III.  He has a left renal stone, under management by a urologist. He states he also has rheumatoid arthritis, he states it also affects his back which causes intermittent back pain, no back pain referable to his very small AAA. He denies abdominal pain.  The patient does not seem to walk enough to elicit claudication in legs with walking, as his walking is limited by dyspnea and the pain from flat feet. The patient denies a history of stroke or TIA symptoms.  He is wearing knee high compression hose.  Pt Diabetic: Yes, states his A1C was 6.7 in March 2017; since then he has intentionally lost 20 pounds Pt smoker: smoker  (1 ppd, started at age 49 yrs)    Past Medical History  Diagnosis Date  . Emphysema   . Asthma   . Sleep apnea     On CPAP  . Hypertension   . AAA (abdominal aortic aneurysm)   . Arthritis     Rhuematoid  . Obstructive sleep apnea   . Chronic renal insufficiency   . GERD (gastroesophageal reflux disease)   . Hearing loss of both ears   . Nephrolithiasis   . Hiatal hernia   . Diverticulosis   . Male hypogonadism   . Allergy     Rhinitis  . Varicose veins   . Vitamin B 12 deficiency   . Stasis dermatitis   . Hyperlipidemia    Past Surgical History  Procedure Laterality Date  . Carpal tunnel release      bilateral  . Kidney stone surgery  2003  . Left shoulder    . Left hip replacement    . Left knee    .  Lithotripsy    . Joint replacement Left     Shoulder  . Joint replacement Left     Hip   . Fracture surgery Left     Fibula   Social History Social History   Social History  . Marital Status: Divorced    Spouse Name: N/A  . Number of Children: N/A  . Years of Education: N/A   Occupational History  . Not on file.   Social History Main Topics  . Smoking status: Current Every Day Smoker -- 1.00 packs/day for 48 years    Types: E-cigarettes  . Smokeless tobacco: Never Used  . Alcohol Use: 0.0 oz/week    0 Standard drinks or equivalent per week  . Drug Use: No  . Sexual Activity: Not on file   Other Topics Concern  . Not on file   Social History Narrative   Family History Family History  Problem Relation Age of Onset  . Emphysema Father   . Asthma Father   . Osteoarthritis Father   . Heart attack Mother   . Uterine cancer Mother   . Heart disease Mother   . ADD / ADHD Son   . Depression Son   . Hypertension Brother   . Heart disease Brother  Pacemaker   . Heart murmur Brother     Current Outpatient Prescriptions on File Prior to Visit  Medication Sig Dispense Refill  . albuterol (PROVENTIL) (2.5 MG/3ML) 0.083% nebulizer solution Twice daily and asneeded    . albuterol-ipratropium (COMBIVENT) 18-103 MCG/ACT inhaler Inhale 2 puffs into the lungs every 6 (six) hours as needed.      Marland Kitchen aspirin 81 MG tablet 160 mg. 2 by mouth daily     . Calcium-Magnesium-Vitamin D (CITRACAL CALCIUM+D) 600-40-500 MG-MG-UNIT TB24 Take 1 tablet by mouth daily.      . cetirizine (ZYRTEC) 10 MG tablet Take 10 mg by mouth daily.      . cyanocobalamin 2000 MCG tablet Take 2,500 mcg by mouth daily.     . febuxostat (ULORIC) 40 MG tablet Take 40 mg by mouth daily.    . folic acid (FOLVITE) 800 MCG tablet Take 400 mcg by mouth daily.      Marland Kitchen guaiFENesin (MUCINEX) 600 MG 12 hr tablet Take 600 mg by mouth 2 (two) times daily as needed.     . InFLIXimab (REMICADE IV) Inject into the vein every  6 (six) weeks.    Marland Kitchen ipratropium (ATROVENT) 0.02 % nebulizer solution Twice daily and asneeded    . methotrexate (RHEUMATREX) 2.5 MG tablet Take 2.5 mg by mouth. 4 tablets weekly    . Multiple Vitamin (MULTIVITAMIN) tablet Take 1 tablet by mouth daily.      . Pantoprazole Sodium (PROTONIX PO) Take by mouth.    . SYMBICORT 160-4.5 MCG/ACT inhaler 2 puffs twice daily    . triamterene-hydrochlorothiazide (MAXZIDE-25) 37.5-25 MG per tablet Once daily    . albuterol (PROVENTIL HFA;VENTOLIN HFA) 108 (90 BASE) MCG/ACT inhaler Inhale 2 puffs into the lungs every 4 (four) hours as needed for wheezing. 1 Inhaler 6   No current facility-administered medications on file prior to visit.   Allergies  Allergen Reactions  . Chantix [Varenicline] Other (See Comments)    Suicidal thoughts    ROS: See HPI for pertinent positives and negatives.  Physical Examination  Filed Vitals:   07/09/15 0908  BP: 104/70  Pulse: 90  Temp: 97.5 F (36.4 C)  TempSrc: Oral  Resp: 26  Height: 5\' 7"  (1.702 m)  Weight: 249 lb (112.946 kg)  SpO2: 96%   Body mass index is 38.99 kg/(m^2).  General: A&O x 3, WD, obese male.  Pulmonary: Sym exp, very limited air movt, no rales or rhonchi, + wheezing. + moist cough.  Cardiac: RRR, Nl S1, S2, no detected murmur.   Carotid Bruits Right Left   Negative Negative   Aorta is not palpable Radial pulses are 2+ palpable and =    1-2+ pitting edema in both lower legs and feet; hemosiderin deposits in right lower leg                      VASCULAR EXAM:  LE Pulses Right Left       FEMORAL  2+ palpable  not palpable        POPLITEAL  not palpable   not palpable       POSTERIOR TIBIAL  not palpable   not palpable        DORSALIS PEDIS      ANTERIOR TIBIAL 2+ palpable  2+ palpable      Gastrointestinal: softly obese, large panus, NTND, -G/R, - HSM, - masses  palpated, - CVAT B.  Musculoskeletal: M/S 5/5 throughout, Extremities without ischemic changes. Flat feet.  Neurologic: CN 2-12 intact except is hard of hearing, Pain and light touch intact in extremities are intact, Motor exam as listed above.  Non-Invasive Vascular Imaging  AAA Duplex (07/09/2015)  Previous size: 2.7 cm (Date: 05/08/14, [facility])  Current size:  2.8 cm (Date: 07/09/2015)  Medical Decision Making  The patient is a 70 y.o. male who presents with asymptomatic small AAA with no increase in size.  Chronic venous insufficiency: 1-2+ pitting edema in both lower legs, hemosiderin deposits in right lower leg. He is already wearing knee high compression hose. The patient was counseled re smoking cessation and given several free resources re smoking cessation.   Based on this patient's exam and diagnostic studies, the patient will follow up in 1 year  with the following studies: AAA duplex.Pt states he prefers afternoon.  Consideration for repair of AAA would be made when the size is 5.5 cm, growth > 1 cm/yr, and symptomatic status.  I emphasized the importance of maximal medical management including strict control of blood pressure, blood glucose, and lipid levels, antiplatelet agents, obtaining regular exercise, and cessation of smoking.   The patient was given information about AAA including signs, symptoms, treatment, and how to minimize the risk of enlargement and rupture of aneurysms.    The patient was advised to call 911 should the patient experience sudden onset abdominal or back pain.   Thank you for allowing [place] to participate in this patient's care.  Charisse March, RN, MSN, FNP-C [facility] [place] Office: [phone]  Clinic Physician: Early  07/09/2015, 9:34 AM

## 2015-07-09 NOTE — Patient Instructions (Addendum)
Abdominal Aortic Aneurysm An aneurysm is a weakened or damaged part of an artery wall that bulges from the normal force of blood pumping through the body. An abdominal aortic aneurysm is an aneurysm that occurs in the lower part of the aorta, the main artery of the body.  The major concern with an abdominal aortic aneurysm is that it can enlarge and burst (rupture) or blood can flow between the layers of the wall of the aorta through a tear (aorticdissection). Both of these conditions can cause bleeding inside the body and can be life threatening unless diagnosed and treated promptly. CAUSES  The exact cause of an abdominal aortic aneurysm is unknown. Some contributing factors are:   A hardening of the arteries caused by the buildup of fat and other substances in the lining of a blood vessel (arteriosclerosis).  Inflammation of the walls of an artery (arteritis).   Connective tissue diseases, such as Marfan syndrome.   Abdominal trauma.   An infection, such as syphilis or staphylococcus, in the wall of the aorta (infectious aortitis) caused by bacteria. RISK FACTORS  Risk factors that contribute to an abdominal aortic aneurysm may include:  Age older than 60 years.   High blood pressure (hypertension).  Male gender.  Ethnicity (white race).  Obesity.  Family history of aneurysm (first degree relatives only).  Tobacco use. PREVENTION  The following healthy lifestyle habits may help decrease your risk of abdominal aortic aneurysm:  Quitting smoking. Smoking can raise your blood pressure and cause arteriosclerosis.  Limiting or avoiding alcohol.  Keeping your blood pressure, blood sugar level, and cholesterol levels within normal limits.  Decreasing your salt intake. In somepeople, too much salt can raise blood pressure and increase your risk of abdominal aortic aneurysm.  Eating a diet low in saturated fats and cholesterol.  Increasing your fiber intake by including  whole grains, vegetables, and fruits in your diet. Eating these foods may help lower blood pressure.  Maintaining a healthy weight.  Staying physically active and exercising regularly. SYMPTOMS  The symptoms of abdominal aortic aneurysm may vary depending on the size and rate of growth of the aneurysm.Most grow slowly and do not have any symptoms. When symptoms do occur, they may include:  Pain (abdomen, side, lower back, or groin). The pain may vary in intensity. A sudden onset of severe pain may indicate that the aneurysm has ruptured.  Feeling full after eating only small amounts of food.  Nausea or vomiting or both.  Feeling a pulsating lump in the abdomen.  Feeling faint or passing out. DIAGNOSIS  Since most unruptured abdominal aortic aneurysms have no symptoms, they are often discovered during diagnostic exams for other conditions. An aneurysm may be found during the following procedures:  Ultrasonography (A one-time screening for abdominal aortic aneurysm by ultrasonography is also recommended for all men aged 65-75 years who have ever smoked).  X-ray exams.  A computed tomography (CT).  Magnetic resonance imaging (MRI).  Angiography or arteriography. TREATMENT  Treatment of an abdominal aortic aneurysm depends on the size of your aneurysm, your age, and risk factors for rupture. Medication to control blood pressure and pain may be used to manage aneurysms smaller than 6 cm. Regular monitoring for enlargement may be recommended by your caregiver if:  The aneurysm is 3-4 cm in size (an annual ultrasonography may be recommended).  The aneurysm is 4-4.5 cm in size (an ultrasonography every 6 months may be recommended).  The aneurysm is larger than 4.5 cm in   size (your caregiver may ask that you be examined by a vascular surgeon). If your aneurysm is larger than 6 cm, surgical repair may be recommended. There are two main methods for repair of an aneurysm:   Endovascular  repair (a minimally invasive surgery). This is done most often.  Open repair. This method is used if an endovascular repair is not possible.   This information is not intended to replace advice given to you by your health care provider. Make sure you discuss any questions you have with your health care provider.   Document Released: 11/05/2004 Document Revised: 05/23/2012 Document Reviewed: 02/26/2012 Elsevier Interactive Patient Education 2016 Elsevier Inc.     Steps to Quit Smoking  Smoking tobacco can be harmful to your health and can affect almost every organ in your body. Smoking puts you, and those around you, at risk for developing many serious chronic diseases. Quitting smoking is difficult, but it is one of the best things that you can do for your health. It is never too late to quit. WHAT ARE THE BENEFITS OF QUITTING SMOKING? When you quit smoking, you lower your risk of developing serious diseases and conditions, such as:  Lung cancer or lung disease, such as COPD.  Heart disease.  Stroke.  Heart attack.  Infertility.  Osteoporosis and bone fractures. Additionally, symptoms such as coughing, wheezing, and shortness of breath may get better when you quit. You may also find that you get sick less often because your body is stronger at fighting off colds and infections. If you are pregnant, quitting smoking can help to reduce your chances of having a baby of low birth weight. HOW DO I GET READY TO QUIT? When you decide to quit smoking, create a plan to make sure that you are successful. Before you quit:  Pick a date to quit. Set a date within the next two weeks to give you time to prepare.  Write down the reasons why you are quitting. Keep this list in places where you will see it often, such as on your bathroom mirror or in your car or wallet.  Identify the people, places, things, and activities that make you want to smoke (triggers) and avoid them. Make sure to take  these actions:  Throw away all cigarettes at home, at work, and in your car.  Throw away smoking accessories, such as ashtrays and lighters.  Clean your car and make sure to empty the ashtray.  Clean your home, including curtains and carpets.  Tell your family, friends, and coworkers that you are quitting. Support from your loved ones can make quitting easier.  Talk with your health care provider about your options for quitting smoking.  Find out what treatment options are covered by your health insurance. WHAT STRATEGIES CAN I USE TO QUIT SMOKING?  Talk with your healthcare provider about different strategies to quit smoking. Some strategies include:  Quitting smoking altogether instead of gradually lessening how much you smoke over a period of time. Research shows that quitting "cold turkey" is more successful than gradually quitting.  Attending in-person counseling to help you build problem-solving skills. You are more likely to have success in quitting if you attend several counseling sessions. Even short sessions of 10 minutes can be effective.  Finding resources and support systems that can help you to quit smoking and remain smoke-free after you quit. These resources are most helpful when you use them often. They can include:  Online chats with a counselor.  Telephone   quitlines.  Printed self-help materials.  Support groups or group counseling.  Text messaging programs.  Mobile phone applications.  Taking medicines to help you quit smoking. (If you are pregnant or breastfeeding, talk with your health care provider first.) Some medicines contain nicotine and some do not. Both types of medicines help with cravings, but the medicines that include nicotine help to relieve withdrawal symptoms. Your health care provider may recommend:  Nicotine patches, gum, or lozenges.  Nicotine inhalers or sprays.  Non-nicotine medicine that is taken by mouth. Talk with your health care  provider about combining strategies, such as taking medicines while you are also receiving in-person counseling. Using these two strategies together makes you more likely to succeed in quitting than if you used either strategy on its own. If you are pregnant or breastfeeding, talk with your health care provider about finding counseling or other support strategies to quit smoking. Do not take medicine to help you quit smoking unless told to do so by your health care provider. WHAT THINGS CAN I DO TO MAKE IT EASIER TO QUIT? Quitting smoking might feel overwhelming at first, but there is a lot that you can do to make it easier. Take these important actions:  Reach out to your family and friends and ask that they support and encourage you during this time. Call telephone quitlines, reach out to support groups, or work with a counselor for support.  Ask people who smoke to avoid smoking around you.  Avoid places that trigger you to smoke, such as bars, parties, or smoke-break areas at work.  Spend time around people who do not smoke.  Lessen stress in your life, because stress can be a smoking trigger for some people. To lessen stress, try:  Exercising regularly.  Deep-breathing exercises.  Yoga.  Meditating.  Performing a body scan. This involves closing your eyes, scanning your body from head to toe, and noticing which parts of your body are particularly tense. Purposefully relax the muscles in those areas.  Download or purchase mobile phone or tablet apps (applications) that can help you stick to your quit plan by providing reminders, tips, and encouragement. There are many free apps, such as QuitGuide from the CDC (Centers for Disease Control and Prevention). You can find other support for quitting smoking (smoking cessation) through smokefree.gov and other websites. HOW WILL I FEEL WHEN I QUIT SMOKING? Within the first 24 hours of quitting smoking, you may start to feel some withdrawal  symptoms. These symptoms are usually most noticeable 2-3 days after quitting, but they usually do not last beyond 2-3 weeks. Changes or symptoms that you might experience include:  Mood swings.  Restlessness, anxiety, or irritation.  Difficulty concentrating.  Dizziness.  Strong cravings for sugary foods in addition to nicotine.  Mild weight gain.  Constipation.  Nausea.  Coughing or a sore throat.  Changes in how your medicines work in your body.  A depressed mood.  Difficulty sleeping (insomnia). After the first 2-3 weeks of quitting, you may start to notice more positive results, such as:  Improved sense of smell and taste.  Decreased coughing and sore throat.  Slower heart rate.  Lower blood pressure.  Clearer skin.  The ability to breathe more easily.  Fewer sick days. Quitting smoking is very challenging for most people. Do not get discouraged if you are not successful the first time. Some people need to make many attempts to quit before they achieve long-term success. Do your best to stick to   your quit plan, and talk with your health care provider if you have any questions or concerns.   This information is not intended to replace advice given to you by your health care provider. Make sure you discuss any questions you have with your health care provider.   Document Released: 01/20/2001 Document Revised: 06/12/2014 Document Reviewed: 06/12/2014 Elsevier Interactive Patient Education 2016 Elsevier Inc.  

## 2015-07-25 DIAGNOSIS — M0589 Other rheumatoid arthritis with rheumatoid factor of multiple sites: Secondary | ICD-10-CM | POA: Diagnosis not present

## 2015-07-25 DIAGNOSIS — Z79899 Other long term (current) drug therapy: Secondary | ICD-10-CM | POA: Diagnosis not present

## 2015-08-05 DIAGNOSIS — E119 Type 2 diabetes mellitus without complications: Secondary | ICD-10-CM | POA: Diagnosis not present

## 2015-08-08 DIAGNOSIS — G4733 Obstructive sleep apnea (adult) (pediatric): Secondary | ICD-10-CM | POA: Diagnosis not present

## 2015-08-08 DIAGNOSIS — K219 Gastro-esophageal reflux disease without esophagitis: Secondary | ICD-10-CM | POA: Diagnosis not present

## 2015-08-08 DIAGNOSIS — I10 Essential (primary) hypertension: Secondary | ICD-10-CM | POA: Diagnosis not present

## 2015-08-08 DIAGNOSIS — J4520 Mild intermittent asthma, uncomplicated: Secondary | ICD-10-CM | POA: Diagnosis not present

## 2015-08-22 DIAGNOSIS — M542 Cervicalgia: Secondary | ICD-10-CM | POA: Diagnosis not present

## 2015-08-22 DIAGNOSIS — N183 Chronic kidney disease, stage 3 (moderate): Secondary | ICD-10-CM | POA: Diagnosis not present

## 2015-08-22 DIAGNOSIS — M0589 Other rheumatoid arthritis with rheumatoid factor of multiple sites: Secondary | ICD-10-CM | POA: Diagnosis not present

## 2015-08-22 DIAGNOSIS — M1A09X0 Idiopathic chronic gout, multiple sites, without tophus (tophi): Secondary | ICD-10-CM | POA: Diagnosis not present

## 2015-09-03 DIAGNOSIS — Z9989 Dependence on other enabling machines and devices: Secondary | ICD-10-CM | POA: Diagnosis not present

## 2015-09-03 DIAGNOSIS — G4733 Obstructive sleep apnea (adult) (pediatric): Secondary | ICD-10-CM | POA: Diagnosis not present

## 2015-09-05 DIAGNOSIS — M0589 Other rheumatoid arthritis with rheumatoid factor of multiple sites: Secondary | ICD-10-CM | POA: Diagnosis not present

## 2015-09-06 NOTE — Addendum Note (Signed)
Addended by: Melodye Ped C on: 09/06/2015 02:06 PM   Modules accepted: Orders

## 2015-10-07 ENCOUNTER — Other Ambulatory Visit: Payer: Self-pay

## 2015-10-17 DIAGNOSIS — Z79899 Other long term (current) drug therapy: Secondary | ICD-10-CM | POA: Diagnosis not present

## 2015-10-17 DIAGNOSIS — M0589 Other rheumatoid arthritis with rheumatoid factor of multiple sites: Secondary | ICD-10-CM | POA: Diagnosis not present

## 2015-10-30 DIAGNOSIS — E119 Type 2 diabetes mellitus without complications: Secondary | ICD-10-CM | POA: Diagnosis not present

## 2015-10-30 DIAGNOSIS — J4520 Mild intermittent asthma, uncomplicated: Secondary | ICD-10-CM | POA: Diagnosis not present

## 2015-11-06 DIAGNOSIS — G4733 Obstructive sleep apnea (adult) (pediatric): Secondary | ICD-10-CM | POA: Diagnosis not present

## 2015-11-06 DIAGNOSIS — I10 Essential (primary) hypertension: Secondary | ICD-10-CM | POA: Diagnosis not present

## 2015-11-06 DIAGNOSIS — J4520 Mild intermittent asthma, uncomplicated: Secondary | ICD-10-CM | POA: Diagnosis not present

## 2015-11-06 DIAGNOSIS — K219 Gastro-esophageal reflux disease without esophagitis: Secondary | ICD-10-CM | POA: Diagnosis not present

## 2015-11-28 DIAGNOSIS — M0589 Other rheumatoid arthritis with rheumatoid factor of multiple sites: Secondary | ICD-10-CM | POA: Diagnosis not present

## 2015-12-19 DIAGNOSIS — M0589 Other rheumatoid arthritis with rheumatoid factor of multiple sites: Secondary | ICD-10-CM | POA: Diagnosis not present

## 2015-12-19 DIAGNOSIS — M542 Cervicalgia: Secondary | ICD-10-CM | POA: Diagnosis not present

## 2015-12-19 DIAGNOSIS — N183 Chronic kidney disease, stage 3 (moderate): Secondary | ICD-10-CM | POA: Diagnosis not present

## 2015-12-19 DIAGNOSIS — M1A09X0 Idiopathic chronic gout, multiple sites, without tophus (tophi): Secondary | ICD-10-CM | POA: Diagnosis not present

## 2016-01-13 DIAGNOSIS — R58 Hemorrhage, not elsewhere classified: Secondary | ICD-10-CM | POA: Diagnosis not present

## 2016-01-20 DIAGNOSIS — M1A09X0 Idiopathic chronic gout, multiple sites, without tophus (tophi): Secondary | ICD-10-CM | POA: Diagnosis not present

## 2016-01-20 DIAGNOSIS — M0589 Other rheumatoid arthritis with rheumatoid factor of multiple sites: Secondary | ICD-10-CM | POA: Diagnosis not present

## 2016-01-27 DIAGNOSIS — E119 Type 2 diabetes mellitus without complications: Secondary | ICD-10-CM | POA: Diagnosis not present

## 2016-01-27 DIAGNOSIS — I10 Essential (primary) hypertension: Secondary | ICD-10-CM | POA: Diagnosis not present

## 2016-03-05 DIAGNOSIS — M0589 Other rheumatoid arthritis with rheumatoid factor of multiple sites: Secondary | ICD-10-CM | POA: Diagnosis not present

## 2016-04-16 DIAGNOSIS — M1A09X0 Idiopathic chronic gout, multiple sites, without tophus (tophi): Secondary | ICD-10-CM | POA: Diagnosis not present

## 2016-04-16 DIAGNOSIS — M0589 Other rheumatoid arthritis with rheumatoid factor of multiple sites: Secondary | ICD-10-CM | POA: Diagnosis not present

## 2016-04-16 DIAGNOSIS — N183 Chronic kidney disease, stage 3 (moderate): Secondary | ICD-10-CM | POA: Diagnosis not present

## 2016-04-16 DIAGNOSIS — M542 Cervicalgia: Secondary | ICD-10-CM | POA: Diagnosis not present

## 2016-04-16 DIAGNOSIS — Z6841 Body Mass Index (BMI) 40.0 and over, adult: Secondary | ICD-10-CM | POA: Diagnosis not present

## 2016-04-23 DIAGNOSIS — M0589 Other rheumatoid arthritis with rheumatoid factor of multiple sites: Secondary | ICD-10-CM | POA: Diagnosis not present

## 2016-04-23 DIAGNOSIS — Z79899 Other long term (current) drug therapy: Secondary | ICD-10-CM | POA: Diagnosis not present

## 2016-05-05 DIAGNOSIS — D509 Iron deficiency anemia, unspecified: Secondary | ICD-10-CM | POA: Diagnosis not present

## 2016-05-05 DIAGNOSIS — Z0000 Encounter for general adult medical examination without abnormal findings: Secondary | ICD-10-CM | POA: Diagnosis not present

## 2016-05-05 DIAGNOSIS — Z1321 Encounter for screening for nutritional disorder: Secondary | ICD-10-CM | POA: Diagnosis not present

## 2016-05-05 DIAGNOSIS — I10 Essential (primary) hypertension: Secondary | ICD-10-CM | POA: Diagnosis not present

## 2016-05-05 DIAGNOSIS — Z125 Encounter for screening for malignant neoplasm of prostate: Secondary | ICD-10-CM | POA: Diagnosis not present

## 2016-05-05 DIAGNOSIS — E7800 Pure hypercholesterolemia, unspecified: Secondary | ICD-10-CM | POA: Diagnosis not present

## 2016-05-05 DIAGNOSIS — E119 Type 2 diabetes mellitus without complications: Secondary | ICD-10-CM | POA: Diagnosis not present

## 2016-05-14 DIAGNOSIS — I452 Bifascicular block: Secondary | ICD-10-CM | POA: Diagnosis not present

## 2016-05-14 DIAGNOSIS — E7800 Pure hypercholesterolemia, unspecified: Secondary | ICD-10-CM | POA: Diagnosis not present

## 2016-05-14 DIAGNOSIS — J449 Chronic obstructive pulmonary disease, unspecified: Secondary | ICD-10-CM | POA: Diagnosis not present

## 2016-05-14 DIAGNOSIS — N183 Chronic kidney disease, stage 3 (moderate): Secondary | ICD-10-CM | POA: Diagnosis not present

## 2016-05-18 DIAGNOSIS — J040 Acute laryngitis: Secondary | ICD-10-CM | POA: Diagnosis not present

## 2016-05-18 DIAGNOSIS — K210 Gastro-esophageal reflux disease with esophagitis: Secondary | ICD-10-CM | POA: Diagnosis not present

## 2016-05-18 DIAGNOSIS — H6122 Impacted cerumen, left ear: Secondary | ICD-10-CM | POA: Diagnosis not present

## 2016-05-18 DIAGNOSIS — G4733 Obstructive sleep apnea (adult) (pediatric): Secondary | ICD-10-CM | POA: Diagnosis not present

## 2016-05-18 DIAGNOSIS — J320 Chronic maxillary sinusitis: Secondary | ICD-10-CM | POA: Diagnosis not present

## 2016-05-18 DIAGNOSIS — H903 Sensorineural hearing loss, bilateral: Secondary | ICD-10-CM | POA: Diagnosis not present

## 2016-05-18 DIAGNOSIS — J322 Chronic ethmoidal sinusitis: Secondary | ICD-10-CM | POA: Diagnosis not present

## 2016-05-20 ENCOUNTER — Telehealth: Payer: Self-pay | Admitting: Acute Care

## 2016-05-20 DIAGNOSIS — F17210 Nicotine dependence, cigarettes, uncomplicated: Secondary | ICD-10-CM

## 2016-05-21 NOTE — Telephone Encounter (Signed)
Will forward to lung screening pool 

## 2016-05-25 NOTE — Telephone Encounter (Signed)
LMTC x 1  

## 2016-05-28 NOTE — Telephone Encounter (Signed)
Patient called back about scheduling appointment/ contact # [phone]...ert

## 2016-05-29 NOTE — Telephone Encounter (Signed)
Will route to lung screening pool.  

## 2016-06-03 NOTE — Telephone Encounter (Signed)
LMTC x 1  

## 2016-06-03 NOTE — Telephone Encounter (Signed)
Spoke with pt and scheduled for [facility] 07/13/16 2:30 CT ordered Nothing further needed

## 2016-06-25 DIAGNOSIS — Z79899 Other long term (current) drug therapy: Secondary | ICD-10-CM | POA: Diagnosis not present

## 2016-06-25 DIAGNOSIS — M0589 Other rheumatoid arthritis with rheumatoid factor of multiple sites: Secondary | ICD-10-CM | POA: Diagnosis not present

## 2016-07-01 ENCOUNTER — Encounter: Payer: Self-pay | Admitting: Family

## 2016-07-13 ENCOUNTER — Ambulatory Visit (INDEPENDENT_AMBULATORY_CARE_PROVIDER_SITE_OTHER): Payer: Medicare Other | Admitting: Acute Care

## 2016-07-13 ENCOUNTER — Ambulatory Visit (INDEPENDENT_AMBULATORY_CARE_PROVIDER_SITE_OTHER)
Admission: RE | Admit: 2016-07-13 | Discharge: 2016-07-13 | Disposition: A | Discharge status: Home / Self Care | Payer: Medicare Other | Source: Ambulatory Visit | Attending: Acute Care | Admitting: Acute Care

## 2016-07-13 ENCOUNTER — Encounter: Payer: Self-pay | Admitting: Acute Care

## 2016-07-13 DIAGNOSIS — F17210 Nicotine dependence, cigarettes, uncomplicated: Secondary | ICD-10-CM

## 2016-07-13 NOTE — Progress Notes (Signed)
Shared Decision Making Visit Lung Cancer Screening Program [phone])   Eligibility:  Age 71 y.o.  Pack Years Smoking History Calculation 53 pack year smoking history (# packs/per year x # years smoked)  Recent History of coughing up blood  no  Unexplained weight loss? no ( >Than 15 pounds within the last 6 months )  Prior History Lung / other cancer no (Diagnosis within the last 5 years already requiring surveillance chest CT Scans).  Smoking Status Current Smoker  Former Smokers: Years since quit: NA  Quit Date: NA  Visit Components:  Discussion included one or more decision making aids. yes  Discussion included risk/benefits of screening. yes  Discussion included potential follow up diagnostic testing for abnormal scans. yes  Discussion included meaning and risk of over diagnosis. yes  Discussion included meaning and risk of False Positives. yes  Discussion included meaning of total radiation exposure. yes  Counseling Included:  Importance of adherence to annual lung cancer LDCT screening. yes  Impact of comorbidities on ability to participate in the program. yes  Ability and willingness to under diagnostic treatment. yes  Smoking Cessation Counseling:  Current Smokers:   Discussed importance of smoking cessation.yes   Information about tobacco cessation classes and interventions provided to patient. yes  Patient provided with "ticket" for LDCT Scan. yes  Symptomatic Patient. no  Counseling:NA  Diagnosis Code: Tobacco Use Z72.0  Asymptomatic Patient yes  Counseling (Intermediate counseling: > three minutes counseling) N8295  Former Smokers:   Discussed the importance of maintaining cigarette abstinence. yes  Diagnosis Code: Personal History of Nicotine Dependence. A21.308  Information about tobacco cessation classes and interventions provided to patient. Yes  Patient provided with "ticket" for LDCT Scan. yes  Written Order for Lung Cancer  Screening with LDCT placed in Epic. Yes (CT Chest Lung Cancer Screening Low Dose W/O CM) [id number] Z12.2-Screening of respiratory organs Z87.891-Personal history of nicotine dependence  I have spent 25 minutes of face to face time with Mr. Scheier  discussing the risks and benefits of lung cancer screening. We viewed a power point together that explained in detail the above noted topics. We paused at intervals to allow for questions to be asked and answered to ensure understanding.We discussed that the single most powerful action that he  can take to decrease his risk of developing lung cancer is to quit smoking. We discussed whether or not he is ready to commit to setting a quit date. He is currently not ready to set a quit date. We discussed options for tools to aid in quitting smoking including nicotine replacement therapy, non-nicotine medications, support groups, Quit Smart classes, and behavior modification. We discussed that often times setting smaller, more achievable goals, such as eliminating 1 cigarette a day for a week and then 2 cigarettes a day for a week can be helpful in slowly decreasing the number of cigarettes smoked. This allows for a sense of accomplishment as well as providing a clinical benefit. I gave him the " Be Stronger Than Your Excuses" card with contact information for community resources, classes, free nicotine replacement therapy, and access to mobile apps, text messaging, and on-line smoking cessation help. I have also given him  my card and contact information in the event he needs to contact me. We discussed the time and location of the scan, and that either Abigail Miyamoto RN or I will call with the results within 24-48 hours of receiving them. I have offered him   a copy of the power  point we viewed  as a resource in the event they need reinforcement of the concepts we discussed today in the office. The patient verbalized understanding of all of  the above and had no further  questions upon leaving the office. They have my contact information in the event they have any further questions.  I spent counseling on smoking cessation and the health risks of continued tobacco abuse.  I explained to the patient that there has been a high incidence of coronary artery disease noted on these exams. I explained that this is a non-gated exam therefore degree or severity cannot be determined. This patient is not on statin therapy. I have asked the patient to follow-up with their PCP regarding any incidental finding of coronary artery disease and management with diet or medication as their PCP  feels is clinically indicated. The patient verbalized understanding of the above and had no further questions upon completion of the visit.   Bevelyn Ngo, NP 07/13/2016

## 2016-07-14 ENCOUNTER — Ambulatory Visit (HOSPITAL_COMMUNITY)
Admission: RE | Admit: 2016-07-14 | Discharge: 2016-07-14 | Disposition: A | Discharge status: Home / Self Care | Payer: Medicare Other | Source: Ambulatory Visit | Attending: Family | Admitting: Family

## 2016-07-14 ENCOUNTER — Ambulatory Visit (INDEPENDENT_AMBULATORY_CARE_PROVIDER_SITE_OTHER): Payer: Medicare Other | Admitting: Family

## 2016-07-14 ENCOUNTER — Encounter: Payer: Self-pay | Admitting: Family

## 2016-07-14 VITALS — BP 122/76 | HR 74 | Temp 97.1°F | Resp 20 | Ht 67.0 in | Wt 257.0 lb

## 2016-07-14 DIAGNOSIS — I714 Abdominal aortic aneurysm, without rupture, unspecified: Secondary | ICD-10-CM

## 2016-07-14 DIAGNOSIS — F17200 Nicotine dependence, unspecified, uncomplicated: Secondary | ICD-10-CM

## 2016-07-14 DIAGNOSIS — I872 Venous insufficiency (chronic) (peripheral): Secondary | ICD-10-CM

## 2016-07-14 NOTE — Patient Instructions (Addendum)
Abdominal Aortic Aneurysm Blood pumps away from the heart through tubes (blood vessels) called arteries. Aneurysms are weak or damaged places in the wall of an artery. It bulges out like a balloon. An abdominal aortic aneurysm happens in the main artery of the body (aorta). It can burst or tear, causing bleeding inside the body. This is an emergency. It needs treatment right away. What are the causes? The exact cause is unknown. Things that could cause this problem include:  Fat and other substances building up in the lining of a tube.  Swelling of the walls of a blood vessel.  Certain tissue diseases.  Belly (abdominal) trauma.  An infection in the main artery of the body.  What increases the risk? There are things that make it more likely for you to have an aneurysm. These include:  Being over the age of 71 years old.  Having high blood pressure (hypertension).  Being a male.  Being white.  Being very overweight (obese).  Having a family history of aneurysm.  Using tobacco products.  What are the signs or symptoms? Symptoms depend on the size of the aneurysm and how fast it grows. There may not be symptoms. If symptoms occur, they can include:  Pain (belly, side, lower back, or groin).  Feeling full after eating a small amount of food.  Feeling sick to your stomach (nauseous), throwing up (vomiting), or both.  Feeling a lump in your belly that feels like it is beating (pulsating).  Feeling like you will pass out (faint).  How is this treated?  Medicine to control blood pressure and pain.  Imaging tests to see if the aneurysm gets bigger.  Surgery. How is this prevented? To lessen your chance of getting this condition:  Stop smoking. Stop chewing tobacco.  Limit or avoid alcohol.  Keep your blood pressure, blood sugar, and cholesterol within normal limits.  Eat less salt.  Eat foods low in saturated fats and cholesterol. These are found in animal and  whole dairy products.  Eat more fiber. Fiber is found in whole grains, vegetables, and fruits.  Keep a healthy weight.  Stay active and exercise often.  This information is not intended to replace advice given to you by your health care provider. Make sure you discuss any questions you have with your health care provider. Document Released: 05/23/2012 Document Revised: 07/04/2015 Document Reviewed: 02/26/2012 Elsevier Interactive Patient Education  2017 Elsevier Inc.      Chronic Venous Insufficiency Chronic venous insufficiency, also called venous stasis, is a condition that prevents blood from being pumped effectively through the veins in your legs. Blood may no longer be pumped effectively from the legs back to the heart. This condition can range from mild to severe. With proper treatment, you should be able to continue with an active life. What are the causes? Chronic venous insufficiency occurs when the vein walls become stretched, weakened, or damaged, or when valves within the vein are damaged. Some common causes of this include:  High blood pressure inside the veins (venous hypertension).  Increased blood pressure in the leg veins from long periods of sitting or standing.  A blood clot that blocks blood flow in a vein (deep vein thrombosis, DVT).  Inflammation of a vein (phlebitis) that causes a blood clot to form.  Tumors in the pelvis that cause blood to back up.  What increases the risk? The following factors may make you more likely to develop this condition:  Having a family history of  this condition.  Obesity.  Pregnancy.  Living without enough physical activity or exercise (sedentary lifestyle).  Smoking.  Having a job that requires long periods of standing or sitting in one place.  Being a certain age. Women in their 43s and 47s and men in their 57s are more likely to develop this condition.  What are the signs or symptoms? Symptoms of this condition  include:  Veins that are enlarged, bulging, or twisted (varicose veins).  Skin breakdown or ulcers.  Reddened or discolored skin on the front of the leg.  Brown, smooth, tight, and painful skin just above the ankle, usually on the inside of the leg (lipodermatosclerosis).  Swelling.  How is this diagnosed? This condition may be diagnosed based on:  Your medical history.  A physical exam.  Tests, such as: ? A procedure that creates an image of a blood vessel and nearby organs and provides information about blood flow through the blood vessel (duplex ultrasound). ? A procedure that tests blood flow (plethysmography). ? A procedure to look at the veins using X-ray and dye (venogram).  How is this treated? The goals of treatment are to help you return to an active life and to minimize pain or disability. Treatment depends on the severity of your condition, and it may include:  Wearing compression stockings. These can help relieve symptoms and help prevent your condition from getting worse. However, they do not cure the condition.  Sclerotherapy. This is a procedure involving an injection of a material that "dissolves" damaged veins.  Surgery. This may involve: ? Removing a diseased vein (vein stripping). ? Cutting off blood flow through the vein (laser ablation surgery). ? Repairing a valve.  Follow these instructions at home:  Wear compression stockings as told by your health care provider. These stockings help to prevent blood clots and reduce swelling in your legs.  Take over-the-counter and prescription medicines only as told by your health care provider.  Stay active by exercising, walking, or doing different activities. Ask your health care provider what activities are safe for you and how much exercise you need.  Drink enough fluid to keep your urine clear or pale yellow.  Do not use any products that contain nicotine or tobacco, such as cigarettes and e-cigarettes.  If you need help quitting, ask your health care provider.  Keep all follow-up visits as told by your health care provider. This is important. Contact a health care provider if:  You have redness, swelling, or more pain in the affected area.  You see a red streak or line that extends up or down from the affected area.  You have skin breakdown or a loss of skin in the affected area, even if the breakdown is small.  You get an injury in the affected area. Get help right away if:  You get an injury and an open wound in the affected area.  You have severe pain that does not get better with medicine.  You have sudden numbness or weakness in the foot or ankle below the affected area, or you have trouble moving your foot or ankle.  You have a fever and you have worse or persistent symptoms.  You have chest pain.  You have shortness of breath. Summary  Chronic venous insufficiency, also called venous stasis, is a condition that prevents blood from being pumped effectively through the veins in your legs.  Chronic venous insufficiency occurs when the vein walls become stretched, weakened, or damaged, or when valves within  the vein are damaged.  Treatment for this condition depends on how severe your condition is, and it may involve wearing compression stockings or having a procedure.  Make sure you stay active by exercising, walking, or doing different activities. Ask your health care provider what activities are safe for you and how much exercise you need. This information is not intended to replace advice given to you by your health care provider. Make sure you discuss any questions you have with your health care provider. Document Released: 06/01/2006 Document Revised: 12/16/2015 Document Reviewed: 12/16/2015 Elsevier Interactive Patient Education  2017 [employer].      Steps to Quit Smoking Smoking tobacco can be bad for your health. It can also affect almost every organ in  your body. Smoking puts you and people around you at risk for many serious long-lasting (chronic) diseases. Quitting smoking is hard, but it is one of the best things that you can do for your health. It is never too late to quit. What are the benefits of quitting smoking? When you quit smoking, you lower your risk for getting serious diseases and conditions. They can include:  Lung cancer or lung disease.  Heart disease.  Stroke.  Heart attack.  Not being able to have children (infertility).  Weak bones (osteoporosis) and broken bones (fractures).  If you have coughing, wheezing, and shortness of breath, those symptoms may get better when you quit. You may also get sick less often. If you are pregnant, quitting smoking can help to lower your chances of having a baby of low birth weight. What can I do to help me quit smoking? Talk with your doctor about what can help you quit smoking. Some things you can do (strategies) include:  Quitting smoking totally, instead of slowly cutting back how much you smoke over a period of time.  Going to in-person counseling. You are more likely to quit if you go to many counseling sessions.  Using resources and support systems, such as: ? Agricultural engineer with a Veterinary surgeon. ? Phone quitlines. ? Automotive engineer. ? Support groups or group counseling. ? Text messaging programs. ? Mobile phone apps or applications.  Taking medicines. Some of these medicines may have nicotine in them. If you are pregnant or breastfeeding, do not take any medicines to quit smoking unless your doctor says it is okay. Talk with your doctor about counseling or other things that can help you.  Talk with your doctor about using more than one strategy at the same time, such as taking medicines while you are also going to in-person counseling. This can help make quitting easier. What things can I do to make it easier to quit? Quitting smoking might feel very hard at  first, but there is a lot that you can do to make it easier. Take these steps:  Talk to your family and friends. Ask them to support and encourage you.  Call phone quitlines, reach out to support groups, or work with a Veterinary surgeon.  Ask people who smoke to not smoke around you.  Avoid places that make you want (trigger) to smoke, such as: ? Bars. ? Parties. ? Smoke-break areas at work.  Spend time with people who do not smoke.  Lower the stress in your life. Stress can make you want to smoke. Try these things to help your stress: ? Getting regular exercise. ? Deep-breathing exercises. ? Yoga. ? Meditating. ? Doing a body scan. To do this, close your eyes, focus on one  area of your body at a time from head to toe, and notice which parts of your body are tense. Try to relax the muscles in those areas.  Download or buy apps on your mobile phone or tablet that can help you stick to your quit plan. There are many free apps, such as QuitGuide from the [employer] developer for Disease Control and Prevention). You can find more support from smokefree.gov and other websites.  This information is not intended to replace advice given to you by your health care provider. Make sure you discuss any questions you have with your health care provider. Document Released: 11/22/2008 Document Revised: 09/24/2015 Document Reviewed: 06/12/2014 Elsevier Interactive Patient Education  2018 [employer].     Before your next abdominal ultrasound:  Take two Extra-Strength Gas-X capsules at bedtime the night before the test. Take another two Extra-Strength Gas-X capsules 3 hours before the test.  Avoid gas forming foods the day before the test.

## 2016-07-14 NOTE — Progress Notes (Signed)
[facility] [place]   CC: Follow up Abdominal Aortic Aneurysm  History of Present Illness  Chad Duke is a 71 y.o. (Jul 07, 1945) male  patient of Dr. Arbie Cookey who presents for follow up evaluation of abdominal aortic aneurysm which was originally found after an ultrasound screening.  The aneurysm was 2.7 cm in diameter by US performed at [facility] on 05/08/2014.The patient has no family history of AAA. Past medical history includes: tobacco abuse, emphysema, hypertension managed with maxzide, and CKD stage III.  He has a left renal stone, under management by a urologist. He states he also has rheumatoid arthritis, he states it also affects his back which causes intermittent back pain, no back pain referable to his very small AAA. He denies abdominal pain.  The patient does not seem to walk enough to elicit claudication in legs with walking, as his walking is limited by dyspnea and the pain from flat feet. The patient denies a history of stroke or TIA symptoms.  He is wearing knee high compression hose.  Pt Diabetic: Yes, states his A1C was 5.7 in March 2018; since then he has intentionally lost 20 pounds Pt smoker: smoker  (1-1.5 ppd, started at age 60 yrs)   Past Medical History:  Diagnosis Date  . AAA (abdominal aortic aneurysm) (HCC)   . Allergy    Rhinitis  . Arthritis    Rhuematoid  . Asthma   . Chronic renal insufficiency   . Diverticulosis   . Emphysema   . GERD (gastroesophageal reflux disease)   . Hearing loss of both ears   . Hiatal hernia   . Hyperlipidemia   . Hypertension   . Male hypogonadism   . Nephrolithiasis   . Obstructive sleep apnea   . Sleep apnea    On CPAP  . Stasis dermatitis   . Varicose veins   . Vitamin B 12 deficiency    Past Surgical History:  Procedure Laterality Date  . CARPAL TUNNEL RELEASE     bilateral  . FRACTURE SURGERY Left    Fibula  . JOINT REPLACEMENT Left    Shoulder  . JOINT  REPLACEMENT Left    Hip   . KIDNEY STONE SURGERY  2003  . left hip replacement    . left knee    . left shoulder    . LITHOTRIPSY     Social History Social History   Social History  . Marital status: Divorced    Spouse name: N/A  . Number of children: N/A  . Years of education: N/A   Occupational History  . Not on file.   Social History Main Topics  . Smoking status: Current Every Day Smoker    Packs/day: 1.00    Years: 53.00    Types: E-cigarettes  . Smokeless tobacco: Never Used     Comment: 1-1 1/2 pk per day  . Alcohol use 0.0 oz/week  . Drug use: No  . Sexual activity: Not on file   Other Topics Concern  . Not on file   Social History Narrative  . No narrative on file   Family History Family History  Problem Relation Age of Onset  . Emphysema Father   . Asthma Father   . Osteoarthritis Father   . Heart attack Mother   . Uterine cancer Mother   . Heart disease Mother   . ADD / ADHD Son   . Depression Son   . Hypertension Brother   . Heart disease Brother  Pacemaker   . Heart murmur Brother     Current Outpatient Prescriptions on File Prior to Visit  Medication Sig Dispense Refill  . albuterol (PROVENTIL) (2.5 MG/3ML) 0.083% nebulizer solution Twice daily and asneeded    . albuterol-ipratropium (COMBIVENT) 18-103 MCG/ACT inhaler Inhale 1 puff into the lungs every 4 (four) hours as needed.     Marland Kitchen aspirin 81 MG tablet 160 mg. 2 by mouth daily     . Calcium-Magnesium-Vitamin D (CITRACAL CALCIUM+D) 600-40-500 MG-MG-UNIT TB24 Take 1 tablet by mouth daily.      . cetirizine (ZYRTEC) 10 MG tablet Take 10 mg by mouth daily.      . colchicine 0.6 MG tablet Take 0.6 mg by mouth daily.    . cyanocobalamin 2000 MCG tablet Take 2,500 mcg by mouth daily.     . febuxostat (ULORIC) 40 MG tablet Take 40 mg by mouth daily.    . folic acid (FOLVITE) 800 MCG tablet Take 1,200 mcg by mouth daily.     Marland Kitchen guaiFENesin (MUCINEX) 600 MG 12 hr tablet Take 600 mg by mouth 2  (two) times daily as needed.     . InFLIXimab (REMICADE IV) Inject into the vein every 6 (six) weeks.    Marland Kitchen ipratropium (ATROVENT) 0.02 % nebulizer solution Twice daily and asneeded    . methotrexate (RHEUMATREX) 2.5 MG tablet Take 2.5 mg by mouth. 4 tablets weekly    . Multiple Vitamin (MULTIVITAMIN) tablet Take 1 tablet by mouth daily.      . Pantoprazole Sodium (PROTONIX PO) Take by mouth.    . SYMBICORT 160-4.5 MCG/ACT inhaler 2 puffs twice daily    . triamterene-hydrochlorothiazide (MAXZIDE-25) 37.5-25 MG per tablet Once daily    . albuterol (PROVENTIL HFA;VENTOLIN HFA) 108 (90 BASE) MCG/ACT inhaler Inhale 2 puffs into the lungs every 4 (four) hours as needed for wheezing. 1 Inhaler 6   No current facility-administered medications on file prior to visit.    Allergies  Allergen Reactions  . Chantix [Varenicline] Other (See Comments)    Suicidal thoughts    ROS: See HPI for pertinent positives and negatives.  Physical Examination  Vitals:   07/14/16 0939  BP: 122/76  Pulse: 74  Resp: 20  Temp: 97.1 F (36.2 C)  TempSrc: Oral  SpO2: 95%  Weight: 257 lb (116.6 kg)  Height: 5\' 7"  (1.702 m)   Body mass index is 40.25 kg/m.  General: A&O x 3, WD, obese male.  Pulmonary: Sym exp, respirations are moderately labored at rest, very limited air movt, no rales or rhonchi, + wheezing. + moist cough.  Cardiac: RRR, Nl S1, S2, no detected murmur.   Carotid Bruits Right Left   Negative Negative   Aorta is not palpable Radial pulses are 2+ palpable and =    1-2+ pitting edema in both lower legs and feet; hemosiderin deposits in right lower leg                      VASCULAR EXAM:  LE Pulses Right Left       FEMORAL  not palpable  2+ palpable        POPLITEAL  not palpable   not palpable       POSTERIOR TIBIAL  not palpable   not  palpable        DORSALIS PEDIS      ANTERIOR TIBIAL faintly palpable  faintly palpable      Gastrointestinal: softly obese, large panus, NTND, -G/R, - HSM, - masses palpated, - CVAT B.  Musculoskeletal: M/S 5/5 throughout, Extremities without ischemic changes. Flat feet.  Neurologic: CN 2-12 intact except is hard of hearing, Pain and light touch intact in extremities are intact, Motor exam as listed above.     Non-Invasive Vascular Imaging  AAA Duplex (07/14/2016)  Previous size: 2.8 cm (Date: 07-09-15)  Current size:  3.0 cm (Date: 07-14-16), Right CIA: 1.1 cm; Left CIA: 1.3 cm. Decreased visualization of the abdominal vasculature due to overlying bowel gas and patient body habitus. No significant stenosis of the abdominal aorta or bilateral proximal common iliac arteries.   Medical Decision Making  The patient is a 71 y.o. male who presents with asymptomatic small AAA with slight increase in size, from 2.8 cm to 3.0 cm in a year. The patient was counseled re smoking cessation and given several free resources re smoking cessation.    Based on this patient's exam and diagnostic studies, the patient will follow up in 1 year  with the following studies: AAA duplex.  Consideration for repair of AAA would be made when the size is 5.0 cm, growth > 1 cm/yr, and symptomatic status.        The patient was given information about AAA including signs, symptoms, treatment, and how to minimize the risk of enlargement and rupture of aneurysms.    I emphasized the importance of maximal medical management including strict control of blood pressure, blood glucose, and lipid levels, antiplatelet agents, obtaining regular exercise, and cessation of smoking.   The patient was advised to call 911 should the patient experience sudden onset abdominal or back pain.   Thank you for allowing [place] to participate in this patient's care.  Charisse March, RN, MSN, FNP-C [facility] [place] Office: [phone]  Clinic Physician: Early  07/14/2016, 10:30 AM

## 2016-07-21 ENCOUNTER — Telehealth: Payer: Self-pay | Admitting: Acute Care

## 2016-07-21 DIAGNOSIS — F17210 Nicotine dependence, cigarettes, uncomplicated: Secondary | ICD-10-CM

## 2016-07-22 NOTE — Telephone Encounter (Signed)
Pt returned phone call..ert ° ° °

## 2016-07-22 NOTE — Telephone Encounter (Signed)
Will forward to the lung screening pool 

## 2016-07-22 NOTE — Telephone Encounter (Addendum)
Pt request a message to be left on voicemail if unavailable to know who to ask for and who to callback.. Pt contact # [phone]...ert  Angelique Blonder please call patient and let him know his low-dose screening CT was read as a Lung RADS 2: nodules that are benign in appearance and behavior with a very low likelihood of becoming a clinically active cancer due to size or lack of growth. Recommendation per radiology is for a repeat LDCT in 12 months. I had sent a previous message stating that the patient needs pulmonary consult for his right middle lobe atelectasis. Please fax results to PCP, Place order for annual screening for June 2019, and see if patient is agreeable for consult. Thank you

## 2016-07-23 NOTE — Telephone Encounter (Signed)
LMTC x 1  

## 2016-07-23 NOTE — Telephone Encounter (Signed)
Pt informed of CT results per Kandice Robinsons, NP.  PT verbalized understanding.  Copy sent to PCP.  Order placed for 1 yr f/u CT. Appt made for pulmonary consult with Dr Sherene Sires 09/08/16 2:30.

## 2016-08-05 DIAGNOSIS — M0589 Other rheumatoid arthritis with rheumatoid factor of multiple sites: Secondary | ICD-10-CM | POA: Diagnosis not present

## 2016-08-20 DIAGNOSIS — N183 Chronic kidney disease, stage 3 (moderate): Secondary | ICD-10-CM | POA: Diagnosis not present

## 2016-08-20 DIAGNOSIS — Z6841 Body Mass Index (BMI) 40.0 and over, adult: Secondary | ICD-10-CM | POA: Diagnosis not present

## 2016-08-20 DIAGNOSIS — M0589 Other rheumatoid arthritis with rheumatoid factor of multiple sites: Secondary | ICD-10-CM | POA: Diagnosis not present

## 2016-08-20 DIAGNOSIS — M1A09X0 Idiopathic chronic gout, multiple sites, without tophus (tophi): Secondary | ICD-10-CM | POA: Diagnosis not present

## 2016-08-20 DIAGNOSIS — M7989 Other specified soft tissue disorders: Secondary | ICD-10-CM | POA: Diagnosis not present

## 2016-08-20 DIAGNOSIS — M542 Cervicalgia: Secondary | ICD-10-CM | POA: Diagnosis not present

## 2016-08-27 DIAGNOSIS — E119 Type 2 diabetes mellitus without complications: Secondary | ICD-10-CM | POA: Diagnosis not present

## 2016-08-27 DIAGNOSIS — M8580 Other specified disorders of bone density and structure, unspecified site: Secondary | ICD-10-CM | POA: Diagnosis not present

## 2016-08-27 DIAGNOSIS — M859 Disorder of bone density and structure, unspecified: Secondary | ICD-10-CM | POA: Diagnosis not present

## 2016-09-03 DIAGNOSIS — G4733 Obstructive sleep apnea (adult) (pediatric): Secondary | ICD-10-CM | POA: Diagnosis not present

## 2016-09-03 DIAGNOSIS — J449 Chronic obstructive pulmonary disease, unspecified: Secondary | ICD-10-CM | POA: Diagnosis not present

## 2016-09-03 DIAGNOSIS — I10 Essential (primary) hypertension: Secondary | ICD-10-CM | POA: Diagnosis not present

## 2016-09-03 DIAGNOSIS — J4520 Mild intermittent asthma, uncomplicated: Secondary | ICD-10-CM | POA: Diagnosis not present

## 2016-09-08 ENCOUNTER — Ambulatory Visit (INDEPENDENT_AMBULATORY_CARE_PROVIDER_SITE_OTHER): Payer: Medicare Other | Admitting: Internal Medicine

## 2016-09-08 ENCOUNTER — Encounter: Payer: Self-pay | Admitting: Internal Medicine

## 2016-09-08 VITALS — BP 128/90 | HR 90 | Ht 67.0 in | Wt 259.0 lb

## 2016-09-08 DIAGNOSIS — F17210 Nicotine dependence, cigarettes, uncomplicated: Secondary | ICD-10-CM | POA: Insufficient documentation

## 2016-09-08 DIAGNOSIS — J9819 Other pulmonary collapse: Secondary | ICD-10-CM | POA: Insufficient documentation

## 2016-09-08 DIAGNOSIS — J449 Chronic obstructive pulmonary disease, unspecified: Secondary | ICD-10-CM

## 2016-09-08 MED ORDER — PREDNISONE 10 MG PO TABS: ORAL_TABLET | ORAL | 0 refills | Status: DC

## 2016-09-08 MED ORDER — TIOTROPIUM BROMIDE MONOHYDRATE 2.5 MCG/ACT IN AERS: 2.0000 | INHALATION_SPRAY | Freq: Every day | RESPIRATORY_TRACT | 11 refills | Status: DC

## 2016-09-08 MED ORDER — TIOTROPIUM BROMIDE MONOHYDRATE 2.5 MCG/ACT IN AERS: 2.0000 | INHALATION_SPRAY | Freq: Every day | RESPIRATORY_TRACT | 0 refills | Status: DC

## 2016-09-08 NOTE — Assessment & Plan Note (Signed)

## 2016-09-08 NOTE — Progress Notes (Signed)
Subjective:     Patient ID: Tammy Ericsson, male   DOB: 04/04/1945, 71 y.o.   MRN: [id number]  HPI   80 yowm active smoker with RA on MTX per Dierdre Forth  grew up in [place] with breathing problems as child dx as asthma  and never much able to do aerobic activities then in his early 50's got worse and daily inhalers since referred to pulmonary clinic 09/08/2016 by Dr Renne Crigler not impressed with any inhalers including symbicort but much better p prednisone for RA with last use   summer 2017 with GOLD III criteria 10/10/13.    09/08/2016 1st [facility] office visit/ Mae Cianci   Chief Complaint  Patient presents with  . Pulmonary Consult    Referred by Kandice Robinsons, NP for eval of abnormal lung screening CT Chest done 07/14/16. Pt states he has known COPD and has had worsening SOB over the past 2 months.  He gets winded with exertion such as taking out the trash or carrying something heavy.    sleeps fine on cpap per Dr Renne Crigler x 10-12 years  Doe onset indolent gradually worse = MMRC2 = can't walk a nl pace on a flat grade s sob but does fine slow and flat eg shopping/ on multiple forms saba/sam's smi/ nebs   No obvious day to day or daytime variability or assoc excess/ purulent sputum or mucus plugs or hemoptysis or cp or chest tightness, subjective wheeze or overt sinus or hb symptoms. No unusual exp hx or h/o childhood pna/ asthma or knowledge of premature birth.  Sleeping ok on cpap without nocturnal  or early am exacerbation  of respiratory  c/o's or need for noct saba. Also denies any obvious fluctuation of symptoms with weather or environmental changes or other aggravating or alleviating factors except as outlined above   Current Medications, Allergies, Complete Past Medical History, Past Surgical History, Family History, and Social History were reviewed in [employer] record.  ROS  The following are not active complaints unless bolded sore throat, dysphagia, dental  problems, itching, sneezing,  nasal congestion or excess/ purulent secretions, ear ache,   fever, chills, sweats, unintended wt loss, classically pleuritic or exertional cp,  orthopnea pnd or leg swelling, presyncope, palpitations, abdominal pain, anorexia, nausea, vomiting, diarrhea  or change in bowel or bladder habits, change in stools or urine, dysuria,hematuria,  rash, arthralgias, visual complaints, headache, numbness, weakness or ataxia or problems with walking or coordination,  change in mood/affect or memory.         Review of Systems     Objective:   Physical Exam    amb obese wm nad     Wt Readings from Last 3 Encounters:  09/08/16 259 lb (117.5 kg)  07/14/16 257 lb (116.6 kg)  07/09/15 249 lb (112.9 kg)    Vital signs reviewed  - Note on arrival 02 sats  95% on RA     HEENT: nl dentition, turbinates bilaterally, and oropharynx. Nl external ear canals without cough reflex   NECK :  without JVD/Nodes/TM/ nl carotid upstrokes bilaterally   LUNGS: no acc muscle use,  Nl contour chest with very distant bs bilaterally - minimal rhonchi symmetric, not localized    CV:  RRR  no s3 or murmur or increase in P2, and  2+ pitting sym  edema   ABD:  soft and nontender with nl inspiratory excursion in the supine position. No bruits or organomegaly appreciated, bowel sounds nl  MS:  Nl gait/ ext warm without deformities, calf tenderness, cyanosis or clubbing No obvious joint restrictions   SKIN: warm and dry without lesions    NEURO:  alert, approp, nl sensorium with  no motor or cerebellar deficits apparent.     I personally reviewed images and agree with radiology impression as follows:   Chest CT 07/14/16 1. Lung-RADS 2-S, benign appearance or behavior. Continue annual screening with low-dose chest CT without contrast in 12 months. 2. The "S" modifier above refers to potentially clinically significant non lung cancer related findings. Specifically, three-vessel coronary  atherosclerosis . 3. Complete right middle lobe atelectasis. Central right middle lobe airways appear patent. 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     Assessment:

## 2016-09-08 NOTE — Assessment & Plan Note (Addendum)
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  Would like to optimize copd rx and bring back for cxr and regroup then re ? Need for fob

## 2016-09-08 NOTE — Assessment & Plan Note (Signed)
Spirometry  10/10/13  FEV1 1.38 (48%)  Ratio 52  - 09/08/2016  After extensive coaching device  effectiveness =    90% with smi > try spriiva respimat    Actively smoking with group B symptoms and not having much for aecopd on symbiocort but most impressed with prednisone suggesting an active ab component so lama/laba/ics best option here if insurance /financial support suffices:  symb 160 / spiriva dmi 2 each am  X 2 week then return to regroup  Total time devoted to counseling  > 50 % of initial 60 min office visit:  review case with pt/ discussion of options/alternatives/ personally creating written customized instructions  in presence of pt  then going over those specific  Instructions directly with the pt including how to use all of the meds but in particular covering each new medication in detail and the difference between the maintenance= "automatic" meds and the prns using an action plan format for the latter (If this problem/symptom => do that organization reading Left to right).  Please see AVS from this visit for a full list of these instructions which I personally wrote for this pt and  are unique to this visit.

## 2016-09-08 NOTE — Assessment & Plan Note (Signed)
Complicated by hbp/osa   Body mass index is 40.57 kg/m.  -  Trending up  No results found for: TSH   Contributing to gerd risk/ doe/reviewed the need and the process to achieve and maintain neg calorie balance > defer f/u primary care including intermittently monitoring thyroid status

## 2016-09-08 NOTE — Patient Instructions (Addendum)
Plan A = Automatic =   Symbicort 160 Take 2 puffs first thing in am and then another 2 puffs about 12 hours later.  Spiriva 2 pffs each am and protonix Take 30-60 min before first meal of the day   Prednisone 10 mg take  4 each am x 2 days,   2 each am x 2 days,  1 each am x 2 days and stop    Plan B = Backup Only use your albuterol/ipatropium as a rescue medication to be used if you can't catch your breath by resting or doing a relaxed purse lip breathing pattern.  - The less you use it, the better it will work when you need it. - Ok to use the inhaler up to 2 puffs  every 4 hours if you must but call for appointment if use goes up over your usual need - Don't leave home without it !!  (think of it like the spare tire for your car)    Plan C = Crisis - only use your albuterol nebulizer if you first try Plan B and it fails to help > ok to use the nebulizer up to every 4 hours but if start needing it regularly call for immediate appointment  GERD (REFLUX)  is an extremely common cause of respiratory symptoms just like yours , many times with no obvious heartburn at all.    It can be treated with medication, but also with lifestyle changes including elevation of the head of your bed (ideally with 6 inch  bed blocks),  Smoking cessation, avoidance of late meals, excessive alcohol, and avoid fatty foods, chocolate, peppermint, colas, red wine, and acidic juices such as orange juice.  NO MINT OR MENTHOL PRODUCTS SO NO COUGH DROPS  USE SUGARLESS CANDY INSTEAD (Jolley ranchers or Stover's or Life Savers) or even ice chips will also do - the key is to swallow to prevent all throat clearing. NO OIL BASED VITAMINS - use powdered substitutes.     Please schedule a follow up office visit in 2 weeks, sooner if needed with cxr

## 2016-09-16 DIAGNOSIS — M1A09X0 Idiopathic chronic gout, multiple sites, without tophus (tophi): Secondary | ICD-10-CM | POA: Diagnosis not present

## 2016-09-16 DIAGNOSIS — M0589 Other rheumatoid arthritis with rheumatoid factor of multiple sites: Secondary | ICD-10-CM | POA: Diagnosis not present

## 2016-09-22 ENCOUNTER — Encounter: Payer: Self-pay | Admitting: Internal Medicine

## 2016-09-22 ENCOUNTER — Ambulatory Visit (INDEPENDENT_AMBULATORY_CARE_PROVIDER_SITE_OTHER)
Admission: RE | Admit: 2016-09-22 | Discharge: 2016-09-22 | Disposition: A | Discharge status: Home / Self Care | Payer: Medicare Other | Source: Ambulatory Visit | Attending: Internal Medicine | Admitting: Internal Medicine

## 2016-09-22 ENCOUNTER — Ambulatory Visit (INDEPENDENT_AMBULATORY_CARE_PROVIDER_SITE_OTHER): Payer: Medicare Other | Admitting: Internal Medicine

## 2016-09-22 VITALS — BP 134/68 | HR 102 | Ht 67.0 in | Wt 262.0 lb

## 2016-09-22 DIAGNOSIS — F17210 Nicotine dependence, cigarettes, uncomplicated: Secondary | ICD-10-CM | POA: Diagnosis not present

## 2016-09-22 DIAGNOSIS — J9819 Other pulmonary collapse: Secondary | ICD-10-CM

## 2016-09-22 DIAGNOSIS — J849 Interstitial pulmonary disease, unspecified: Secondary | ICD-10-CM | POA: Diagnosis not present

## 2016-09-22 DIAGNOSIS — J449 Chronic obstructive pulmonary disease, unspecified: Secondary | ICD-10-CM

## 2016-09-22 MED ORDER — TIOTROPIUM BROMIDE MONOHYDRATE 2.5 MCG/ACT IN AERS: 2.0000 | INHALATION_SPRAY | Freq: Every day | RESPIRATORY_TRACT | 11 refills | Status: DC

## 2016-09-22 MED ORDER — FAMOTIDINE 20 MG PO TABS: ORAL_TABLET | ORAL | 11 refills | Status: DC

## 2016-09-22 MED ORDER — PREDNISONE 10 MG PO TABS: ORAL_TABLET | ORAL | 0 refills | Status: DC

## 2016-09-22 MED ORDER — ALBUTEROL SULFATE HFA 108 (90 BASE) MCG/ACT IN AERS: INHALATION_SPRAY | RESPIRATORY_TRACT | 11 refills | Status: DC

## 2016-09-22 NOTE — Progress Notes (Signed)
Subjective:     Patient ID: Chad Duke, male   DOB: 08-Mar-1945, 71 y.o.   MRN: [id number]    Brief patient profile:  55 yowm active smoker with RA on MTX per Dierdre Forth  grew up in [place] with breathing problems as child dx as asthma  and never much able to do aerobic activities then in his early 50's got worse and daily inhalers since referred to pulmonary clinic 09/08/2016 by Dr Renne Crigler not impressed with any inhalers including symbicort but much better p prednisone for RA with last use summer 2017 with GOLD III criteria 10/10/13.      History of Present Illness  09/08/2016 1st [facility] office visit/ [name]   Chief Complaint  Patient presents with  . Pulmonary Consult    Referred by Kandice Robinsons, NP for eval of abnormal lung screening CT Chest done 07/14/16. Pt states he has known COPD and has had worsening SOB over the past 2 months.  He gets winded with exertion such as taking out the trash or carrying something heavy.    sleeps fine on cpap per Dr Renne Crigler x 10-12 years  Doe onset indolent gradually worse = MMRC2 = can't walk a nl pace on a flat grade s sob but does fine slow and flat eg shopping/ on multiple forms saba/sam's smi/ nebs  rec Plan A = Automatic =   Symbicort 160 Take 2 puffs first thing in am and then another 2 puffs about 12 hours later.  Spiriva 2 pffs each am and protonix Take 30-60 min before first meal of the day  Prednisone 10 mg take  4 each am x 2 days,   2 each am x 2 days,  1 each am x 2 days and stop  Plan B = Backup Only use your albuterol/ipatropium as a rescue medication  Plan C = Crisis - only use your albuterol nebulizer if you first try Plan B and it fails to help > ok to use the nebulizer up to every 4 hours but if start needing it regularly call for immediate appointment GERD diet      09/22/2016  f/u ov/Chritopher Coster re: COPD GOLD  III/ still smoking  Chief Complaint  Patient presents with  . Follow-up    pt states that he has modified regimen  given to him at last OV and this seems to help s/s some.     still sleeping fine on cpap  Only took spiriva x 3 days then stopped and back to overuse of saba/sama smi and neb Much better only while on prednisone   No obvious day to day or daytime variability or assoc excess/ purulent sputum or mucus plugs or hemoptysis or cp or chest tightness, subjective wheeze or overt sinus or hb symptoms. No unusual exp hx or h/o childhood pna/ asthma or knowledge of premature birth.  Sleeping ok without nocturnal  or early am exacerbation  of respiratory  c/o's or need for noct saba. Also denies any obvious fluctuation of symptoms with weather or environmental changes or other aggravating or alleviating factors except as outlined above   Current Medications, Allergies, Complete Past Medical History, Past Surgical History, Family History, and Social History were reviewed in [employer] record.  ROS  The following are not active complaints unless bolded sore throat, dysphagia, dental problems, itching, sneezing,  nasal congestion or excess/ purulent secretions, ear ache,   fever, chills, sweats, unintended wt loss, classically pleuritic or exertional cp,  orthopnea  pnd or leg swelling, presyncope, palpitations, abdominal pain, anorexia, nausea, vomiting, diarrhea  or change in bowel or bladder habits, change in stools or urine, dysuria,hematuria,  rash, arthralgias, visual complaints, headache, numbness, weakness or ataxia or problems with walking or coordination,  change in mood/affect or memory.                    Objective:   Physical Exam    amb obese wm nad     09/22/2016        262   09/08/16 259 lb (117.5 kg)  07/14/16 257 lb (116.6 kg)  07/09/15 249 lb (112.9 kg)    Vital signs reviewed  - Note on arrival 02 sats  95% on RA     HEENT: nl dentition, turbinates bilaterally, and oropharynx. Nl external ear canals without cough reflex   NECK :  without  JVD/Nodes/TM/ nl carotid upstrokes bilaterally   LUNGS: no acc muscle use,  Nl contour chest with minimal rhonchi bilaterally / no localized wheeze w/in 2 h of duoneb    CV:  RRR  no s3 or murmur or increase in P2, and  2+ pitting sym lower ext  edema   ABD:  soft and nontender with nl inspiratory excursion in the supine position. No bruits or organomegaly appreciated, bowel sounds nl  MS:  Nl gait/ ext warm without deformities, calf tenderness, cyanosis or clubbing No obvious joint restrictions   SKIN: warm and dry without lesions    NEURO:  alert, approp, nl sensorium with  no motor or cerebellar deficits apparent.      CXR PA and Lateral:   09/22/2016 :    I personally reviewed images and    impression as follows:   Persistent band like atx SSA rml typical of rml syndrome      Assessment:

## 2016-09-22 NOTE — Patient Instructions (Addendum)
Plan A = Automatic =   Symbicort 160 Take 2 puffs first thing in am and then another 2 puffs about 12 hours later.  Spiriva 2 pffs each am and protonix Take 30-60 min before first meal of the day and pepcid 20 mg one at bedtime   Prednisone 10 mg take  4 each am x 2 days,   2 each am x 2 days,  1 each am x 2 days and stop   Plan B = Backup Only use your albuterol/ipatropium as a rescue medication to be used if you can't catch your breath by resting or doing a relaxed purse lip breathing pattern  - The less you use it, the better it will work when you need it. - Ok to use the inhaler up to 1 puffs  every 4 hours if you must  (proair = albtuerol 2 puff every 4 hours as needed use this when combivent expires) .   Plan C = Crisis - only use your albuterol nebulizer if you first try Plan B and it fails to help > ok to use the nebulizer up to every 4 hours but if start needing it regularly call for immediate appointment   For cough congestion > mucinex 1200 mg every 12 hours as needed    Please remember to go to the  x-ray department downstairs in the basement  for your tests - we will call you with the results when they are available.       Please schedule a follow up office visit in 4 weeks, sooner if needed  with all medications /inhalers/ solutions in hand so we can verify exactly what you are taking. This includes all medications from all doctors and over the counters   PFTs on return     .

## 2016-09-23 ENCOUNTER — Telehealth: Payer: Self-pay | Admitting: Internal Medicine

## 2016-09-23 NOTE — Telephone Encounter (Signed)
Notes recorded by Nyoka Cowden, MD on 09/23/2016 at 9:16 AM EDT Call pt: Reviewed cxr and no acute change - still shows same problem in Right middle lobe that is probably nothing but clearly vz on cxr so no more ct's needed, we will follow this up at next ov to sort out what to do  Patient is aware of results.

## 2016-09-23 NOTE — Assessment & Plan Note (Signed)
See CT chest 07/14/16 with patent airway   cxr today is classic for rml syndrome but he is a smoker so will probably need fob at some point/ advised  Discussed in detail all the  indications, usual  risks and alternatives  relative to the benefits with patient who agrees to proceed with conservative f/u only for now

## 2016-09-23 NOTE — Assessment & Plan Note (Addendum)
Spirometry  10/10/13  FEV1 1.38 (48%)  Ratio 52  - 09/08/2016    try spriiva respimat   - used x 3 days only   - 09/22/2016  After extensive coaching HFA effectiveness =    90% > rechallenge with spiriva 2.5 x 2 daily and return for f/u pfts in 4 weeks  He was much better on pred and much worse off indicating a significant ab component difficult to control  DDX of  difficult airways management almost all start with A and  include Adherence, Ace Inhibitors, Acid Reflux, Active Sinus Disease, Alpha 1 Antitripsin deficiency, Anxiety masquerading as Airways dz,  ABPA,  Allergy(esp in young), Aspiration (esp in elderly), Adverse effects of meds,  Active smokers, A bunch of PE's (a small clot burden can't cause this syndrome unless there is already severe underlying pulm or vascular dz with poor reserve) plus two Bs  = Bronchiectasis and Beta blocker use..and one C= CHF  In this case Adherence is the biggest issue and starts with  inability to use HFA effectively and also  understand that SABA treats the symptoms but doesn't get to the underlying problem (inflammation).  I used  the analogy of putting steroid cream on a rash to help explain the meaning of topical therapy and the need to get the drug to the target tissue.   - see hfa teaching  Actively smoking > see sep a/p   ? Allergy/ asthma component > continue high dose ICS   ? Acid (or non-acid) GERD > always difficult to exclude as up to 75% of pts in some series report no assoc GI/ Heartburn symptoms> rec consider next : max (24h)  acid suppression and diet restrictions/    ?adverse drug effects > not evidence of mtx toxicity   ? chf > he does have peripheral edema but nothing on cxr to suggest "cardiac asthma"   I had an extended discussion with the patient reviewing all relevant studies completed to date and  lasting 15 to 20 minutes of a 25 minute visit    Each maintenance medication was reviewed in detail including most importantly the  difference between maintenance and prns and under what circumstances the prns are to be triggered using an action plan format that is not reflected in the computer generated alphabetically organized AVS.    Please see AVS for specific instructions unique to this visit that I personally wrote and verbalized to the the pt in detail and then reviewed with pt  by my nurse highlighting any  changes in therapy recommended at today's visit to their plan of care.

## 2016-09-23 NOTE — Assessment & Plan Note (Signed)
>   3 min Discussed the risks and costs (both direct and indirect)  of smoking relative to the benefits of quitting but patient unwilling to commit at this point to a specific quit date.    Although I don't endorse regular use of e cigs/ many pts find them helpful; however, I emphasized they should be considered a "one-way bridge" off all tobacco products.  

## 2016-09-23 NOTE — Assessment & Plan Note (Signed)
Complicated by hbp/osa   Body mass index is 41.04 kg/m.  -  trending up  No results found for: TSH   Contributing to gerd risk/ doe/reviewed the need and the process to achieve and maintain neg calorie balance > defer f/u primary care including intermittently monitoring thyroid status

## 2016-09-25 ENCOUNTER — Telehealth: Payer: Self-pay | Admitting: Internal Medicine

## 2016-09-25 MED ORDER — TIOTROPIUM BROMIDE MONOHYDRATE 2.5 MCG/ACT IN AERS: 2.0000 | INHALATION_SPRAY | Freq: Every day | RESPIRATORY_TRACT | 3 refills | Status: DC

## 2016-09-25 MED ORDER — TIOTROPIUM BROMIDE MONOHYDRATE 2.5 MCG/ACT IN AERS: 2.0000 | INHALATION_SPRAY | Freq: Every day | RESPIRATORY_TRACT | 0 refills | Status: DC

## 2016-09-25 NOTE — Telephone Encounter (Signed)
Called and spoke with pt and he stated that he will pick the samples and the rx for the spiriva 2.5 on Monday.  These have been placed up front for the pt.

## 2016-09-28 ENCOUNTER — Telehealth: Payer: Self-pay | Admitting: Internal Medicine

## 2016-09-28 MED ORDER — ALBUTEROL SULFATE HFA 108 (90 BASE) MCG/ACT IN AERS: INHALATION_SPRAY | RESPIRATORY_TRACT | 3 refills | Status: DC

## 2016-09-28 MED ORDER — FAMOTIDINE 20 MG PO TABS: 20.0000 mg | ORAL_TABLET | Freq: Every day | ORAL | 3 refills | Status: DC

## 2016-09-28 NOTE — Telephone Encounter (Signed)
Already listed as takng protonix and this med has no significant advantage over it but if lower cost on this plan fine with me to change to dexlilant and stop protonix

## 2016-09-28 NOTE — Telephone Encounter (Signed)
Spoke with pt. He is wanting prescriptions for Albuterol HFA and Pepcid. Pt would like to pick these prescriptions up. Rxs have been printed, signed and placed up front for pick up. Nothing further was needed.

## 2016-09-28 NOTE — Telephone Encounter (Signed)
Spoke with patient. He stated that he would to switch from famotidine 20mg  to Dexilant. The reason for the change is due to his insurance not covering the famotidine, but will cover Dexilant. He would like for this RX to be printed.   MW, please advise if this medication change is ok. Thanks!

## 2016-09-29 NOTE — Telephone Encounter (Signed)
Error

## 2016-09-29 NOTE — Telephone Encounter (Signed)
Attempted to contact pt. No answer, no option to leave a message. Will try back.  

## 2016-09-30 MED ORDER — DEXLANSOPRAZOLE 60 MG PO CPDR: DELAYED_RELEASE_CAPSULE | ORAL | 3 refills | Status: DC

## 2016-09-30 NOTE — Telephone Encounter (Signed)
Spoke with pt, aware of recs.  Pt requesting rx to be mailed to him- verified PO box on file.    MW please advise on strength/doseage of dexilant to be prescribed.  Thanks!

## 2016-09-30 NOTE — Telephone Encounter (Signed)
Patient is returning phone call.  °

## 2016-09-30 NOTE — Telephone Encounter (Signed)
Called spoke with patient, let him know that MW Dorette Grate the change to Dexilant 60mg  once daily.  Pt is requesting #90 with 3 refills - as his last refills were for 1 year, will refill Dexilant as so.  Rx printed for MW to sign and placed in his lookat with an addressed envelope Pt is aware the mail will not go out until tomorrow Pt is aware to contact the office if anything further is needed Will sign off

## 2016-09-30 NOTE — Telephone Encounter (Signed)
dexilant 60 mg Take 30-60 min before first meal of the day

## 2016-09-30 NOTE — Telephone Encounter (Signed)
lmomtcb x 2 for the pt.  

## 2016-10-07 ENCOUNTER — Telehealth: Payer: Self-pay | Admitting: Internal Medicine

## 2016-10-07 MED ORDER — DEXLANSOPRAZOLE 60 MG PO CPDR: DELAYED_RELEASE_CAPSULE | ORAL | 3 refills | Status: DC

## 2016-10-07 NOTE — Telephone Encounter (Signed)
Spoke with the pt  He states that he is needing [place] to mail the rx for dexilant again  He never received the last one  He gave me a different address this time- [address] [place] [id number] Nothing further needed per pt

## 2016-10-13 ENCOUNTER — Telehealth: Payer: Self-pay | Admitting: Internal Medicine

## 2016-10-13 MED ORDER — DEXLANSOPRAZOLE 60 MG PO CPDR: DELAYED_RELEASE_CAPSULE | ORAL | 3 refills | Status: DC

## 2016-10-13 MED ORDER — ALBUTEROL SULFATE HFA 108 (90 BASE) MCG/ACT IN AERS: 2.0000 | INHALATION_SPRAY | RESPIRATORY_TRACT | 3 refills | Status: DC | PRN

## 2016-10-13 NOTE — Telephone Encounter (Signed)
Spoke with patient. He stated that the RX for the Ventolin and Dexilant 60mg  were incorrect. Stated that they needed to be for a 3 month supply. RX has been reprinted. Patient states he will pick these up tomorrow.

## 2016-10-28 DIAGNOSIS — M0589 Other rheumatoid arthritis with rheumatoid factor of multiple sites: Secondary | ICD-10-CM | POA: Diagnosis not present

## 2016-11-03 ENCOUNTER — Other Ambulatory Visit: Payer: Self-pay | Admitting: Internal Medicine

## 2016-11-03 DIAGNOSIS — J449 Chronic obstructive pulmonary disease, unspecified: Secondary | ICD-10-CM

## 2016-11-04 ENCOUNTER — Ambulatory Visit (INDEPENDENT_AMBULATORY_CARE_PROVIDER_SITE_OTHER): Payer: Medicare Other | Admitting: Internal Medicine

## 2016-11-04 ENCOUNTER — Encounter: Payer: Self-pay | Admitting: Internal Medicine

## 2016-11-04 VITALS — BP 122/68 | HR 95 | Ht 67.0 in | Wt 264.0 lb

## 2016-11-04 DIAGNOSIS — F17210 Nicotine dependence, cigarettes, uncomplicated: Secondary | ICD-10-CM

## 2016-11-04 DIAGNOSIS — J449 Chronic obstructive pulmonary disease, unspecified: Secondary | ICD-10-CM

## 2016-11-04 LAB — PULMONARY FUNCTION TEST
DL/VA % pred: 87 %
DL/VA: 3.86 ml/min/mmHg/L
DLCO cor % pred: 65 %
DLCO cor: 18.49 ml/min/mmHg
DLCO unc % pred: 64 %
DLCO unc: 18.38 ml/min/mmHg
FEF 25-75 Post: 0.77 L/sec
FEF 25-75 Pre: 0.62 L/sec
FEF2575-%Change-Post: 24 %
FEF2575-%Pred-Post: 36 %
FEF2575-%Pred-Pre: 29 %
FEV1-%Change-Post: 7 %
FEV1-%Pred-Post: 53 %
FEV1-%Pred-Pre: 49 %
FEV1-Post: 1.49 L
FEV1-Pre: 1.39 L
FEV1FVC-%Change-Post: 0 %
FEV1FVC-%Pred-Pre: 77 %
FEV6-%Change-Post: 8 %
FEV6-%Pred-Post: 70 %
FEV6-%Pred-Pre: 65 %
FEV6-Post: 2.56 L
FEV6-Pre: 2.37 L
FEV6FVC-%Change-Post: 0 %
FEV6FVC-%Pred-Post: 102 %
FEV6FVC-%Pred-Pre: 102 %
FVC-%Change-Post: 7 %
FVC-%Pred-Post: 68 %
FVC-%Pred-Pre: 63 %
FVC-Post: 2.65 L
FVC-Pre: 2.46 L
Post FEV1/FVC ratio: 56 %
Post FEV6/FVC ratio: 97 %
Pre FEV1/FVC ratio: 56 %
Pre FEV6/FVC Ratio: 96 %
RV % pred: 134 %
RV: 3.11 L
TLC % pred: 88 %
TLC: 5.72 L

## 2016-11-04 MED ORDER — ALBUTEROL SULFATE (2.5 MG/3ML) 0.083% IN NEBU: 2.5000 mg | INHALATION_SOLUTION | Freq: Four times a day (QID) | RESPIRATORY_TRACT | 12 refills | Status: DC | PRN

## 2016-11-04 MED ORDER — FLUTTER DEVI
0 refills | Status: DC
Start: 2016-11-04 — End: 2018-01-25

## 2016-11-04 MED ORDER — PREDNISONE 10 MG PO TABS: ORAL_TABLET | ORAL | 5 refills | Status: DC

## 2016-11-04 MED ORDER — ALBUTEROL SULFATE HFA 108 (90 BASE) MCG/ACT IN AERS: 2.0000 | INHALATION_SPRAY | RESPIRATORY_TRACT | 0 refills | Status: DC | PRN

## 2016-11-04 MED ORDER — PREDNISONE 10 MG PO TABS: ORAL_TABLET | ORAL | 0 refills | Status: DC

## 2016-11-04 NOTE — Progress Notes (Signed)
Subjective:     Patient ID: Chad Duke, male   DOB: Dec 22, 1945, 71 y.o.   MRN: [id number]    Brief patient profile:  53 yowm active smoker with RA on MTX per Dierdre Forth  g[place] in [place] with breathing problems as child dx as asthma  and never much able to do aerobic activities then in his early 50's got worse and daily inhalers since referred to pulmonary clinic 09/08/2016 by Dr Renne Crigler not impressed with any inhalers including symbicort but much better p prednisone for RA with last use summer 2017 with GOLD III criteria 10/10/13.      History of Present Illness  09/08/2016 1st [facility] office visit/ Carollynn Pennywell   Chief Complaint  Patient presents with  . Pulmonary Consult    Referred by Kandice Robinsons, NP for eval of abnormal lung screening CT Chest done 07/14/16. Pt states he has known COPD and has had worsening SOB over the past 2 months.  He gets winded with exertion such as taking out the trash or carrying something heavy.    sleeps fine on cpap per Dr Renne Crigler x 10-12 years  Doe onset indolent gradually worse = MMRC2 = can't walk a nl pace on a flat grade s sob but does fine slow and flat eg shopping/ on multiple forms saba/sam's smi/ nebs  rec Plan A = Automatic =   Symbicort 160 Take 2 puffs first thing in am and then another 2 puffs about 12 hours later.  Spiriva 2 pffs each am and protonix Take 30-60 min before first meal of the day  Prednisone 10 mg take  4 each am x 2 days,   2 each am x 2 days,  1 each am x 2 days and stop  Plan B = Backup Only use your albuterol/ipatropium as a rescue medication  Plan C = Crisis - only use your albuterol nebulizer if you first try Plan B and it fails to help > ok to use the nebulizer up to every 4 hours but if start needing it regularly call for immediate appointment GERD diet      09/22/2016  f/u ov/Eston Heslin re: COPD GOLD  III/ still smoking  Chief Complaint  Patient presents with  . Follow-up    pt states that he has modified regimen  given to him at last OV and this seems to help s/s some.     still sleeping fine on cpap  Only took spiriva x 3 days then stopped and back to overuse of saba/sama smi and neb Much better only while on prednisone  rec Plan A = Automatic =   Symbicort 160 Take 2 puffs first thing in am and then another 2 puffs about 12 hours later.  Spiriva 2 pffs each am and protonix Take 30-60 min before first meal of the day and pepcid 20 mg one at bedtime  Prednisone 10 mg take  4 each am x 2 days,   2 each am x 2 days,  1 each am x 2 days and stop  Plan B = Backup Only use your albuterol/ipatropium as a rescue medication Plan C = Crisis - only use your albuterol nebulizer if you first try Plan B and it fails to help > ok to use the nebulizer up to every 4 hours but if start needing it regularly call for immediate appointment For cough congestion > mucinex 1200 mg every 12 hours as needed      11/04/2016  f/u ov/Domenica Weightman  re:  GOLD II copd Jerilynn Som smoking/ over use of saba/sama continues  Chief Complaint  Patient presents with  . Follow-up    PFT's done today. Breathing is unchanged. He is using combivent 2-3 x per day and his duoneb 4 x daily on average.   still smoking/ severe coughing fits/ thick white mucus  spiriva empty device  Doe = MMRC3 = can't walk 100 yards even at a slow pace at a flat grade s stopping due to sob   Only thing that reduces saba dep is prednisone transiently    No obvious day to day or daytime variability or assoc  purulent sputum or mucus plugs or hemoptysis or cp or chest tightness, subjective wheeze or overt sinus or hb symptoms. No unusual exp hx or h/o childhood pna/ asthma or knowledge of premature birth.  Sleeping ok on cpap  without nocturnal  or early am exacerbation  of respiratory  c/o's or need for noct saba. Also denies any obvious fluctuation of symptoms with weather or environmental changes or other aggravating or alleviating factors except as outlined above    Current Allergies, Complete Past Medical History, Past Surgical History, Family History, and Social History were reviewed in [employer] record.  ROS  The following are not active complaints unless bolded sore throat, dysphagia, dental problems, itching, sneezing,  nasal congestion or disharge of excess mucus or purulent secretions, ear ache,   fever, chills, sweats, unintended wt loss or wt gain, classically pleuritic or exertional cp,  orthopnea pnd or leg swelling, presyncope, palpitations, abdominal pain, anorexia, nausea, vomiting, diarrhea  or change in bowel habits or bladder habits, change in stools or change in urine, dysuria, hematuria,  rash, arthralgias, visual complaints, headache, numbness, weakness or ataxia or problems with walking or coordination,  change in mood/affect or memory.        Current Meds  Medication Sig  . albuterol (PROVENTIL HFA;VENTOLIN HFA) 108 (90 Base) MCG/ACT inhaler Inhale 2 puffs into the lungs every 4 (four) hours as needed for wheezing or shortness of breath.  . Ascorbic Acid (VITAMIN C) 1000 MG tablet Take 1,000 mg by mouth daily.  Marland Kitchen aspirin 81 MG tablet 160 mg. 2 by mouth daily   . Calcium-Magnesium-Vitamin D (CITRACAL CALCIUM+D) 600-40-500 MG-MG-UNIT TB24 Take 1 tablet by mouth daily.    . cetirizine (ZYRTEC) 10 MG tablet Take 10 mg by mouth daily.    . colchicine 0.6 MG tablet Take 0.6 mg by mouth daily as needed.   . cyanocobalamin 2000 MCG tablet Take 2,500 mcg by mouth daily.   . famotidine (PEPCID) 20 MG tablet Take 1 tablet (20 mg total) by mouth at bedtime.  . febuxostat (ULORIC) 40 MG tablet Take 40 mg by mouth daily.  . folic acid (FOLVITE) 800 MCG tablet Take 1,200 mcg by mouth daily.   . InFLIXimab (REMICADE IV) Inject into the vein every 6 (six) weeks.  . methotrexate (RHEUMATREX) 2.5 MG tablet Take 2.5 mg by mouth. 4 tablets weekly  . Multiple Vitamin (MULTIVITAMIN) tablet Take 1 tablet by mouth daily.    .  pantoprazole (PROTONIX) 40 MG tablet Take 40 mg by mouth daily.  . SYMBICORT 160-4.5 MCG/ACT inhaler 2 puffs twice daily  . Tiotropium Bromide Monohydrate (SPIRIVA RESPIMAT) 2.5 MCG/ACT AERS Inhale 2 puffs into the lungs daily.  Marland Kitchen triamterene-hydrochlorothiazide (MAXZIDE-25) 37.5-25 MG per tablet Once daily  . [DISCONTINUED] albuterol (PROVENTIL HFA;VENTOLIN HFA) 108 (90 Base) MCG/ACT inhaler Inhale 2 puffs into the lungs every  4 (four) hours as needed for wheezing or shortness of breath.  .  D] guaiFENesin (MUCINEX) 600 MG 12 hr tablet Take 600 mg by mouth 2 (two) times daily as needed.   .   Ipratropium-Albuterol (COMBIVENT RESPIMAT) 20-100 MCG/ACT AERS respimat Inhale 1 puff into the lungs every 6 (six) hours as needed for wheezing.  . [  ipratropium-albuterol (DUONEB) 0.5-2.5 (3) MG/3ML SOLN Take 3 mLs by nebulization 4 (four) times daily.                         Objective:   Physical Exam    amb obese wm nad     11/04/2016        264  09/22/2016        262   09/08/16 259 lb (117.5 kg)  07/14/16 257 lb (116.6 kg)  07/09/15 249 lb (112.9 kg)    Vital signs reviewed  - Note on arrival 02 sats  94% on RA     HEENT: nl dentition, turbinates bilaterally, and oropharynx. Nl external ear canals without cough reflex   NECK :  without JVD/Nodes/TM/ nl carotid upstrokes bilaterally   LUNGS: no acc muscle use,  Nl contour chest with insp and exp rhonchi   CV:  RRR  no s3 or murmur or increase in P2, and  Trace  pitting sym lower ext  edema   ABD:  soft and nontender with nl inspiratory excursion in the supine position. No bruits or organomegaly appreciated, bowel sounds nl  MS:  Nl gait/ ext warm without deformities, calf tenderness, cyanosis or clubbing No obvious joint restrictions   SKIN: warm and dry without lesions    NEURO:  alert, approp, nl sensorium with  no motor or cerebellar deficits apparent.      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

## 2016-11-04 NOTE — Progress Notes (Signed)
PFT done today. 

## 2016-11-04 NOTE — Patient Instructions (Addendum)
Work on maintaining perfect inhaler technique:  relax and gently blow all the way out then take a nice smooth deep breath back in, triggering the inhaler at same time you start breathing in.  Hold for up to 5 seconds if you can. Blow symbicort out thru nose. Rinse and gargle with water when done  Plan A = Automatic = NO MATTER WHAT=   Symbicort 160 Take 2 puffs first thing in am and then another 2 puffs about 12 hours later and Spiriva each am only  x 2 puffs  Dexilant = protonix 40 mg Take 30-60 min before first meal of the day and pepcid 20 mg at bedtime    Plan B = Backup only  Only use your albuterol (ventolin) as a rescue medication to be used if you can't catch your breath by resting or doing a relaxed purse lip breathing pattern.  - The less you use it, the better it will work when you need it. - Ok to use the inhaler up to 2 puffs  every 4 hours if you must but call for appointment if use goes up over your usual need - Don't leave home without it !!  (think of it like the spare tire for your car)   Plan C = Crisis only  - only use your albuterol nebulizer if you first try Plan B and it fails to help > ok to use the nebulizer up to every 4 hours but if start needing it regularly call for immediate appointment   Plan D = Deltasone 10  - activate if having to use plan C more than usual which should be rare if plan A followed carefully Prednisone 10 mg take  4 each am x 2 days,   2 each am x 2 days,  1 each am x 2 days and stop     For cough/ congestion > mucinex up to 1200 mg every 12 hours as needed and use flutter valve as much possible   Please schedule a follow up office visit in 6 weeks, call sooner if needed with all medications /inhalers/ solutions in hand so we can verify exactly what you are taking. This includes all medications from all doctors and over the counters

## 2016-11-04 NOTE — Assessment & Plan Note (Signed)
>   3 m I took an extended  opportunity with this patient to outline the consequences of continued cigarette use  in airway disorders based on all the data we have from the multiple national lung health studies (perfomed over decades at millions of dollars in cost)  indicating that smoking cessation, not choice of inhalers or physicians, is the most important aspect of care.   May be considering vapes > encouraged

## 2016-11-04 NOTE — Assessment & Plan Note (Signed)
Complicated by hbp/osa   Body mass index is 41.35 kg/m.  -  trending up  No results found for: TSH   Contributing to gerd risk/ doe/reviewed the need and the process to achieve and maintain neg calorie balance > defer f/u primary care including intermittently monitoring thyroid status

## 2016-11-04 NOTE — Assessment & Plan Note (Addendum)
Spirometry  10/10/13  FEV1 1.38 (48%)  Ratio 52  - 09/08/2016    try spriiva respimat   - used x 3 days only  - 09/22/2016    rechallenge with spiriva 2.5 x 2 daily  - 11/04/2016  After extensive coaching HFA effectiveness =    90% - PFT's  11/04/2016  FEV1 1.49 (53 % ) ratio 56  p 7 % improvement from saba p spiriva/symb160/combivent/duoneb w/in 2 h  prior to study with DLCO  64/65 % corrects to 87 % for alv volume  DDX of  difficult airways management almost all start with A and  include Adherence, Ace Inhibitors, Acid Reflux, Active Sinus Disease, Alpha 1 Antitripsin deficiency, Anxiety masquerading as Airways dz,  ABPA,  Allergy(esp in young), Aspiration (esp in elderly), Adverse effects of meds,  Active smokers, A bunch of PE's (a small clot burden can't cause this syndrome unless there is already severe underlying pulm or vascular dz with poor reserve) plus two Bs  = Bronchiectasis and Beta blocker use..and one C= CHF  Adherence is always the initial "prime suspect" and is a multilayered concern that requires a "trust but verify" approach in every patient - starting with knowing how to use medications, especially inhalers, correctly, keeping up with refills and understanding the fundamental difference between maintenance and prns vs those medications only taken for a very short course and then stopped and not refilled.  - see hfa teaching - still not understanding maint vs prns and overusing saba in many forms  Active smoker top of the list of usual suspects  (see separate a/p)   ? Acid (or non-acid) GERD > always difficult to exclude as up to 75% of pts in some series report no assoc GI/ Heartburn symptoms> rec continue max (24h)  acid suppression and diet restrictions/ reviewed    ? Allergy/asthma component > add pred prn    ? Anxiety/depression > usually at the bottom of this list of usual suspects but should be much higher on this pt's based on H and P and may interfere with rx .    I had an  extended discussion with the patient reviewing all relevant studies completed to date and  lasting 25 minutes of a 40  minute office  visit addressing   severe non-specific but potentially very serious refractory respiratory symptoms of uncertain and potentially multiple  etiologies.   Each maintenance medication was reviewed in detail including most importantly the difference between maintenance and prns and under what circumstances the prns are to be triggered using an action plan format that is not reflected in the computer generated alphabetically organized AVS.    Please see AVS for specific instructions unique to this office visit that I personally wrote and verbalized to the the pt in detail and then reviewed with pt  by my nurse highlighting any changes in therapy/plan of care  recommended at today's visit.

## 2016-12-02 DIAGNOSIS — E119 Type 2 diabetes mellitus without complications: Secondary | ICD-10-CM | POA: Diagnosis not present

## 2016-12-10 DIAGNOSIS — M0589 Other rheumatoid arthritis with rheumatoid factor of multiple sites: Secondary | ICD-10-CM | POA: Diagnosis not present

## 2016-12-10 DIAGNOSIS — Z79899 Other long term (current) drug therapy: Secondary | ICD-10-CM | POA: Diagnosis not present

## 2016-12-14 DIAGNOSIS — Z79899 Other long term (current) drug therapy: Secondary | ICD-10-CM | POA: Diagnosis not present

## 2016-12-14 DIAGNOSIS — Z23 Encounter for immunization: Secondary | ICD-10-CM | POA: Diagnosis not present

## 2016-12-14 DIAGNOSIS — E119 Type 2 diabetes mellitus without complications: Secondary | ICD-10-CM | POA: Diagnosis not present

## 2016-12-14 DIAGNOSIS — F17200 Nicotine dependence, unspecified, uncomplicated: Secondary | ICD-10-CM | POA: Diagnosis not present

## 2016-12-14 DIAGNOSIS — Z87891 Personal history of nicotine dependence: Secondary | ICD-10-CM | POA: Diagnosis not present

## 2016-12-14 DIAGNOSIS — I10 Essential (primary) hypertension: Secondary | ICD-10-CM | POA: Diagnosis not present

## 2016-12-14 DIAGNOSIS — E7800 Pure hypercholesterolemia, unspecified: Secondary | ICD-10-CM | POA: Diagnosis not present

## 2016-12-16 DIAGNOSIS — F17210 Nicotine dependence, cigarettes, uncomplicated: Secondary | ICD-10-CM | POA: Diagnosis not present

## 2016-12-16 DIAGNOSIS — R0602 Shortness of breath: Secondary | ICD-10-CM | POA: Diagnosis not present

## 2016-12-16 DIAGNOSIS — J302 Other seasonal allergic rhinitis: Secondary | ICD-10-CM | POA: Diagnosis not present

## 2016-12-16 DIAGNOSIS — Z87891 Personal history of nicotine dependence: Secondary | ICD-10-CM | POA: Diagnosis not present

## 2016-12-16 DIAGNOSIS — M069 Rheumatoid arthritis, unspecified: Secondary | ICD-10-CM | POA: Diagnosis not present

## 2016-12-16 DIAGNOSIS — J449 Chronic obstructive pulmonary disease, unspecified: Secondary | ICD-10-CM | POA: Diagnosis not present

## 2016-12-16 DIAGNOSIS — Z716 Tobacco abuse counseling: Secondary | ICD-10-CM | POA: Diagnosis not present

## 2016-12-17 ENCOUNTER — Ambulatory Visit: Payer: Medicare Other | Admitting: Internal Medicine

## 2016-12-22 DIAGNOSIS — M069 Rheumatoid arthritis, unspecified: Secondary | ICD-10-CM | POA: Diagnosis not present

## 2016-12-22 DIAGNOSIS — J449 Chronic obstructive pulmonary disease, unspecified: Secondary | ICD-10-CM | POA: Diagnosis not present

## 2016-12-22 DIAGNOSIS — E119 Type 2 diabetes mellitus without complications: Secondary | ICD-10-CM | POA: Diagnosis not present

## 2016-12-22 DIAGNOSIS — G4733 Obstructive sleep apnea (adult) (pediatric): Secondary | ICD-10-CM | POA: Diagnosis not present

## 2016-12-23 DIAGNOSIS — M542 Cervicalgia: Secondary | ICD-10-CM | POA: Diagnosis not present

## 2016-12-23 DIAGNOSIS — M1A09X0 Idiopathic chronic gout, multiple sites, without tophus (tophi): Secondary | ICD-10-CM | POA: Diagnosis not present

## 2016-12-23 DIAGNOSIS — Z6841 Body Mass Index (BMI) 40.0 and over, adult: Secondary | ICD-10-CM | POA: Diagnosis not present

## 2016-12-23 DIAGNOSIS — N183 Chronic kidney disease, stage 3 (moderate): Secondary | ICD-10-CM | POA: Diagnosis not present

## 2016-12-23 DIAGNOSIS — M0589 Other rheumatoid arthritis with rheumatoid factor of multiple sites: Secondary | ICD-10-CM | POA: Diagnosis not present

## 2017-01-14 DIAGNOSIS — D539 Nutritional anemia, unspecified: Secondary | ICD-10-CM | POA: Diagnosis not present

## 2017-01-14 DIAGNOSIS — Z87891 Personal history of nicotine dependence: Secondary | ICD-10-CM | POA: Diagnosis not present

## 2017-01-14 DIAGNOSIS — E7800 Pure hypercholesterolemia, unspecified: Secondary | ICD-10-CM | POA: Diagnosis not present

## 2017-01-14 DIAGNOSIS — N183 Chronic kidney disease, stage 3 (moderate): Secondary | ICD-10-CM | POA: Diagnosis not present

## 2017-01-14 DIAGNOSIS — R7303 Prediabetes: Secondary | ICD-10-CM | POA: Diagnosis not present

## 2017-01-14 DIAGNOSIS — I10 Essential (primary) hypertension: Secondary | ICD-10-CM | POA: Diagnosis not present

## 2017-01-14 DIAGNOSIS — F17200 Nicotine dependence, unspecified, uncomplicated: Secondary | ICD-10-CM | POA: Diagnosis not present

## 2017-01-21 DIAGNOSIS — M0589 Other rheumatoid arthritis with rheumatoid factor of multiple sites: Secondary | ICD-10-CM | POA: Diagnosis not present

## 2017-02-25 ENCOUNTER — Other Ambulatory Visit: Payer: Self-pay | Admitting: Internal Medicine

## 2017-03-01 DIAGNOSIS — R7303 Prediabetes: Secondary | ICD-10-CM | POA: Diagnosis not present

## 2017-03-01 DIAGNOSIS — I10 Essential (primary) hypertension: Secondary | ICD-10-CM | POA: Diagnosis not present

## 2017-03-01 DIAGNOSIS — J449 Chronic obstructive pulmonary disease, unspecified: Secondary | ICD-10-CM | POA: Diagnosis not present

## 2017-03-01 DIAGNOSIS — E7800 Pure hypercholesterolemia, unspecified: Secondary | ICD-10-CM | POA: Diagnosis not present

## 2017-03-03 DIAGNOSIS — G4733 Obstructive sleep apnea (adult) (pediatric): Secondary | ICD-10-CM | POA: Diagnosis not present

## 2017-03-03 DIAGNOSIS — G4761 Periodic limb movement disorder: Secondary | ICD-10-CM | POA: Diagnosis not present

## 2017-03-03 DIAGNOSIS — R0902 Hypoxemia: Secondary | ICD-10-CM | POA: Diagnosis not present

## 2017-03-04 ENCOUNTER — Ambulatory Visit: Payer: Self-pay | Admitting: *Deleted

## 2017-03-04 ENCOUNTER — Emergency Department (HOSPITAL_COMMUNITY)
Admission: EM | Admit: 2017-03-04 | Discharge: 2017-03-04 | Disposition: A | Discharge status: Home / Self Care | Payer: Medicare Other | Attending: Emergency Medicine | Admitting: Emergency Medicine

## 2017-03-04 ENCOUNTER — Emergency Department (HOSPITAL_COMMUNITY): Payer: Medicare Other

## 2017-03-04 ENCOUNTER — Other Ambulatory Visit: Payer: Self-pay

## 2017-03-04 ENCOUNTER — Encounter (HOSPITAL_COMMUNITY): Payer: Self-pay

## 2017-03-04 DIAGNOSIS — R0602 Shortness of breath: Secondary | ICD-10-CM | POA: Diagnosis not present

## 2017-03-04 DIAGNOSIS — J441 Chronic obstructive pulmonary disease with (acute) exacerbation: Secondary | ICD-10-CM | POA: Diagnosis not present

## 2017-03-04 DIAGNOSIS — N189 Chronic kidney disease, unspecified: Secondary | ICD-10-CM | POA: Diagnosis not present

## 2017-03-04 DIAGNOSIS — F17200 Nicotine dependence, unspecified, uncomplicated: Secondary | ICD-10-CM | POA: Diagnosis not present

## 2017-03-04 DIAGNOSIS — M069 Rheumatoid arthritis, unspecified: Secondary | ICD-10-CM | POA: Diagnosis not present

## 2017-03-04 DIAGNOSIS — I129 Hypertensive chronic kidney disease with stage 1 through stage 4 chronic kidney disease, or unspecified chronic kidney disease: Secondary | ICD-10-CM | POA: Diagnosis not present

## 2017-03-04 DIAGNOSIS — R05 Cough: Secondary | ICD-10-CM | POA: Insufficient documentation

## 2017-03-04 DIAGNOSIS — N289 Disorder of kidney and ureter, unspecified: Secondary | ICD-10-CM | POA: Diagnosis not present

## 2017-03-04 DIAGNOSIS — Z79899 Other long term (current) drug therapy: Secondary | ICD-10-CM | POA: Insufficient documentation

## 2017-03-04 DIAGNOSIS — F17210 Nicotine dependence, cigarettes, uncomplicated: Secondary | ICD-10-CM | POA: Diagnosis not present

## 2017-03-04 LAB — CBC
HCT: 36.8 % — ABNORMAL LOW (ref 39.0–52.0)
Hemoglobin: 11.9 g/dL — ABNORMAL LOW (ref 13.0–17.0)
MCH: 33.4 pg (ref 26.0–34.0)
MCHC: 32.3 g/dL (ref 30.0–36.0)
MCV: 103.4 fL — ABNORMAL HIGH (ref 78.0–100.0)
Platelets: 207 10*3/uL (ref 150–400)
RBC: 3.56 MIL/uL — ABNORMAL LOW (ref 4.22–5.81)
RDW: 16.2 % — ABNORMAL HIGH (ref 11.5–15.5)
WBC: 8.8 10*3/uL (ref 4.0–10.5)

## 2017-03-04 LAB — BASIC METABOLIC PANEL
Anion gap: 9 (ref 5–15)
BUN: 21 mg/dL — ABNORMAL HIGH (ref 6–20)
CO2: 27 mmol/L (ref 22–32)
Calcium: 9.5 mg/dL (ref 8.9–10.3)
Chloride: 104 mmol/L (ref 101–111)
Creatinine, Ser: 1.3 mg/dL — ABNORMAL HIGH (ref 0.61–1.24)
GFR calc Af Amer: >60 mL/min (ref >60)
GFR calc non Af Amer: 53 mL/min — ABNORMAL LOW (ref >60)
Glucose, Bld: 139 mg/dL — ABNORMAL HIGH (ref 65–99)
Potassium: 4.3 mmol/L (ref 3.5–5.1)
Sodium: 140 mmol/L (ref 135–145)

## 2017-03-04 MED ORDER — PREDNISONE 20 MG PO TABS: ORAL_TABLET | ORAL | 0 refills | Status: DC

## 2017-03-04 MED ORDER — ALBUTEROL SULFATE (2.5 MG/3ML) 0.083% IN NEBU
5.0000 mg | INHALATION_SOLUTION | Freq: Once | RESPIRATORY_TRACT | Status: DC
  Filled 2017-03-04: qty 6

## 2017-03-04 MED ORDER — MAGNESIUM SULFATE IN D5W 1-5 GM/100ML-% IV SOLN
1.0000 g | Freq: Once | INTRAVENOUS | Status: AC
  Administered 2017-03-04: 1 g via INTRAVENOUS
  Filled 2017-03-04: qty 100

## 2017-03-04 MED ORDER — ALBUTEROL (5 MG/ML) CONTINUOUS INHALATION SOLN
10.0000 mg/h | INHALATION_SOLUTION | Freq: Once | RESPIRATORY_TRACT | Status: AC
  Administered 2017-03-04: 10 mg/h via RESPIRATORY_TRACT
  Filled 2017-03-04: qty 20

## 2017-03-04 MED ORDER — MAGNESIUM SULFATE 50 % IJ SOLN: 1.0000 g | Freq: Once | INTRAMUSCULAR | Status: DC

## 2017-03-04 NOTE — ED Triage Notes (Signed)
Patient c/o COPD exacerbation. Patient states he has been using his nebulizer, but no better.

## 2017-03-04 NOTE — ED Notes (Signed)
Ambulated pt in the hall the lowest O2 was 88% and highest was 95% and his respiration remained in the 30s

## 2017-03-04 NOTE — Telephone Encounter (Signed)
Pt called stating that he is having shortness of breath with exertion, "described as huffing and puffing" ; he also says he has used his nebulizer with albuterol and tiotropium five times today with no relief; pt says that he is normally seen at [facility] and Dr Sherene Sires at [facility]; spoke with [place] at [facility] and pt scheduled for appointment with Dr Sherene Sires on 03/05/17 at 1400; per nurse triage pt instructed to go to ED; pt verbalizes understanding.   Reason for Disposition . [1] MODERATE difficulty breathing (e.g., speaks in phrases, SOB even at rest, pulse 100-120) AND [2] NEW-onset or WORSE than normal  Answer Assessment - Initial Assessment Questions 1. RESPIRATORY STATUS: "Describe your breathing?" (e.g., wheezing, shortness of breath, unable to speak, severe coughing)      Shortness of breath 2. ONSET: "When did this breathing problem begin?"      Sunday 02/27/17 3. PATTERN "Does the difficult breathing come and go, or has it been constant since it started?"     Comes and goes 4. SEVERITY: "How bad is your breathing?" (e.g., mild, moderate, severe)    - MILD: No SOB at rest, mild SOB with walking, speaks normally in sentences, can lay down, no retractions, pulse < 100.    - MODERATE: SOB at rest, SOB with minimal exertion and prefers to sit, cannot lie down flat, speaks in phrases, mild retractions, audible wheezing, pulse 100-120.    - SEVERE: Very SOB at rest, speaks in single words, struggling to breathe, sitting hunched forward, retractions, pulse > 120     moderate 5. RECURRENT SYMPTOM: "Have you had difficulty breathing before?" If so, ask: "When was the last time?" and "What happened that time?"      Yes history of COPD 6. CARDIAC HISTORY: "Do you have any history of heart disease?" (e.g., heart attack, angina, bypass surgery, angioplasty)      Hypertension on mediation 7. LUNG HISTORY: "Do you have any history of lung disease?"  (e.g., pulmonary embolus, asthma, emphysema)  COPD 8. CAUSE: "What do you think is causing the breathing problem?"     COPD 9. OTHER SYMPTOMS: "Do you have any other symptoms? (e.g., dizziness, runny nose, cough, chest pain, fever)     no 10. PREGNANCY: "Is there any chance you are pregnant?" "When was your last menstrual period?"       n/a 11. TRAVEL: "Have you traveled out of the country in the last month?" (e.g., travel history, exposures)       no  Protocols used: BREATHING DIFFICULTY-A-AH

## 2017-03-04 NOTE — ED Notes (Signed)
RN attempted to start IV on pt x2

## 2017-03-04 NOTE — ED Provider Notes (Signed)
[facility]-EMERGENCY DEPT Provider Note   CSN: [id number] Arrival date & time: 03/04/17  1649     History   Chief Complaint Chief Complaint  Patient presents with  . COPD    HPI Chad Duke is a 72 y.o. male.  Complains of shortness of breath typical of his COPD onset 4 or 5 days ago.  Symptoms are coming by nonproductive cough.  No fever.  No other associated symptoms he is treated himself with albuterol without adequate relief.  He was started on prednisone 4 days ago by Dr.Sun  HPI  Past Medical History:  Diagnosis Date  . AAA (abdominal aortic aneurysm) (HCC)   . Allergy    Rhinitis  . Arthritis    Rhuematoid  . Asthma   . Chronic renal insufficiency   . Diverticulosis   . Emphysema   . GERD (gastroesophageal reflux disease)   . Hearing loss of both ears   . Hiatal hernia   . Hyperlipidemia   . Hypertension   . Male hypogonadism   . Nephrolithiasis   . Obstructive sleep apnea   . Sleep apnea    On CPAP  . Stasis dermatitis   . Varicose veins   . Vitamin B 12 deficiency     Patient Active Problem List   Diagnosis Date Noted  . 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 Cigarette smoker 09/08/2016  . Morbid obesity due to excess calories (HCC) 09/08/2016  . Pain in joint, ankle and foot 03/31/2012  . Abnormality of gait 03/31/2012  . Rheumatoid arthritis(714.0) 03/31/2012  . COPD GOLD III  02/12/2011  . Sleep apnea 02/12/2011    Past Surgical History:  Procedure Laterality Date  . CARPAL TUNNEL RELEASE     bilateral  . FRACTURE SURGERY Left    Fibula  . JOINT REPLACEMENT Left    Shoulder  . JOINT REPLACEMENT Left    Hip   . KIDNEY STONE SURGERY  2003  . left hip replacement    . left knee    . left shoulder    . LITHOTRIPSY         Home Medications    Prior to Admission medications   Medication Sig Start Date End Date Taking? Authorizing Provider  albuterol (PROVENTIL HFA;VENTOLIN HFA) 108 (90 Base) MCG/ACT  inhaler Inhale 2 puffs into the lungs every 4 (four) hours as needed for wheezing or shortness of breath. 11/04/16   Nyoka Cowden, MD  albuterol (PROVENTIL) (2.5 MG/3ML) 0.083% nebulizer solution Take 3 mLs (2.5 mg total) by nebulization every 6 (six) hours as needed for wheezing or shortness of breath. 11/04/16   Nyoka Cowden, MD  Ascorbic Acid (VITAMIN C) 1000 MG tablet Take 1,000 mg by mouth daily.    [provider]  aspirin 81 MG tablet 160 mg. 2 by mouth daily     [provider]  Calcium-Magnesium-Vitamin D (CITRACAL CALCIUM+D) 600-40-500 MG-MG-UNIT TB24 Take 1 tablet by mouth daily.      [provider]  cetirizine (ZYRTEC) 10 MG tablet Take 10 mg by mouth daily.      [provider]  colchicine 0.6 MG tablet Take 0.6 mg by mouth daily as needed.     [provider]  cyanocobalamin 2000 MCG tablet Take 2,500 mcg by mouth daily.     [provider]  famotidine (PEPCID) 20 MG tablet Take 1 tablet (20 mg total) by mouth at bedtime. 09/28/16   Nyoka Cowden, MD  febuxostat (ULORIC) 40 MG tablet Take 40 mg by mouth daily.    [provider]  folic acid (FOLVITE) 800 MCG tablet Take 1,200 mcg by mouth daily.     [provider]  InFLIXimab (REMICADE IV) Inject into the vein every 6 (six) weeks.    [provider]  methotrexate (RHEUMATREX) 2.5 MG tablet Take 2.5 mg by mouth. 4 tablets weekly 02/11/11   [provider]  Multiple Vitamin (MULTIVITAMIN) tablet Take 1 tablet by mouth daily.      [provider]  pantoprazole (PROTONIX) 40 MG tablet Take 40 mg by mouth daily.    [provider]  predniSONE (DELTASONE) 10 MG tablet Take  4 each am x 2 days,   2 each am x 2 days,  1 each am x 2 days and stop 11/04/16   Nyoka Cowden, MD  Respiratory Therapy Supplies (FLUTTER) DEVI Use as directed 11/04/16   Nyoka Cowden, MD  SYMBICORT 160-4.5 MCG/ACT inhaler 2 puffs twice daily 01/10/11    [provider]  Tiotropium Bromide Monohydrate (SPIRIVA RESPIMAT) 2.5 MCG/ACT AERS Inhale 2 puffs into the lungs daily. 09/25/16   Nyoka Cowden, MD  triamterene-hydrochlorothiazide [facility]) 37.5-25 MG per tablet Once daily 01/13/11   [provider]    Family History Family History  Problem Relation Age of Onset  . Emphysema Father   . Asthma Father   . Osteoarthritis Father   . Heart attack Mother   . Uterine cancer Mother   . Heart disease Mother   . ADD / ADHD Son   . Depression Son   . Hypertension Brother   . Heart disease Brother        Pacemaker   . Heart murmur Brother     Social History Social History   Tobacco Use  . Smoking status: Current Every Day Smoker    Packs/day: 1.50    Years: 53.00    Pack years: 79.50    Types: E-cigarettes  . Smokeless tobacco: Never Used  . Tobacco comment: 1-1 1/2 pk per day  Substance Use Topics  . Alcohol use: Yes    Alcohol/week: 0.0 oz  . Drug use: No     Allergies   Chantix [varenicline]   Review of Systems Review of Systems  Constitutional: Negative.   HENT: Negative.   Respiratory: Positive for cough and shortness of breath.   Cardiovascular: Negative.   Gastrointestinal: Negative.   Musculoskeletal: Negative.   Skin: Negative.   Allergic/Immunologic: Positive for immunocompromised state.  Neurological: Negative.   Psychiatric/Behavioral: Negative.   All other systems reviewed and are negative.    Physical Exam Updated Vital Signs BP 138/88 (BP Location: Right Arm)   Pulse 92   Temp 98.4 F (36.9 C) (Oral)   Resp 20   Ht 5' 6.75" (1.695 m)   Wt 121.1 kg (267 lb)   SpO2 97%   BMI 42.13 kg/m   Physical Exam  Constitutional: He appears well-developed and well-nourished.  HENT:  Head: Normocephalic and atraumatic.  Eyes: Conjunctivae are normal. Pupils are equal, round, and reactive to light.  Neck: Neck supple. No tracheal deviation present. No thyromegaly present.    Cardiovascular: Normal rate and regular rhythm.  No murmur heard. Pulmonary/Chest: Effort normal.  No respiratory distress.  Expiratory wheezes, speaks in    Abdominal: Soft. Bowel sounds are normal. He exhibits no distension. There is no tenderness.  Musculoskeletal: Normal range of motion. He exhibits no edema or tenderness.  Neurological: He is alert. Coordination normal.  Skin: Skin is warm and dry. No rash noted.  Psychiatric: He has a normal mood and affect.  Nursing note and vitals reviewed.    ED Treatments / Results  Labs (all labs ordered are listed, but only abnormal results are displayed) Labs Reviewed  BASIC METABOLIC PANEL - Abnormal; Notable for the following components:      Result Value   Glucose, Bld 139 (*)    BUN 21 (*)    Creatinine, Ser 1.30 (*)    GFR calc non Af Amer 53 (*)    All other components within normal limits  CBC - Abnormal; Notable for the following components:   RBC 3.56 (*)    Hemoglobin 11.9 (*)    HCT 36.8 (*)    MCV 103.4 (*)    RDW 16.2 (*)    All other components within normal limits    EKG  EKG Interpretation  Date/Time:  Thursday March 04 2017 17:21:55 EST Ventricular Rate:  93 PR Interval:    QRS Duration: 141 QT Interval:  397 QTC Calculation: 494 R Axis:   -89 Text Interpretation:  Sinus rhythm Right bundle branch block No significant change since last tracing Confirmed by Doug Sou [phone] on 03/04/2017 7:41:50 PM       Radiology No results found.  Procedures Procedures (including critical care time)  Medications Ordered in ED Medications  albuterol (PROVENTIL) (2.5 MG/3ML) 0.083% nebulizer solution 5 mg (not administered)     Initial Impression / Assessment and Plan / ED Course  I have reviewed the triage vital signs and the nursing notes.  Pertinent labs & imaging results that were available during my care of the patient were reviewed by me and considered in my medical decision making (see  chart for details).     After 1 hour of continuous nebulization and intravenous medium sulfate patient feels breathing is improved and he feels ready to go home.  He is able to walk around the emergency department without dyspnea.  Pulse oximetry dropped transiently to 89% during ambulation but but went back to 95%.  I counseled patient for 5 minutes on smoking cessation Prescription for prednisone for 3 more days he is encouraged to use his albuterol every 4 hours as needed.  Return if needed more than every 4 hours.  He has an appointment with Dr.Wert for tomorrow if needed Final Clinical Impressions(s) / ED Diagnoses  Dx #1 COPD exacerbation #2 tobacco abuse #3 mild renal insufficiency Final diagnoses:  None    ED Discharge Orders    None       Doug Sou, MD 03/04/17 2240

## 2017-03-04 NOTE — Discharge Instructions (Signed)
Use your albuterol inhaler or nebulizer every 4 hours.  Return if needed more than every 4 hours or keep your scheduled appointment with Dr.Wert tomorrow.  Ask Dr. Sherene Sires or Dr.Sun to help you to stop smoking

## 2017-03-05 ENCOUNTER — Ambulatory Visit (INDEPENDENT_AMBULATORY_CARE_PROVIDER_SITE_OTHER): Payer: Medicare Other | Admitting: Internal Medicine

## 2017-03-05 ENCOUNTER — Encounter: Payer: Self-pay | Admitting: Internal Medicine

## 2017-03-05 VITALS — BP 128/82 | HR 104 | Ht 67.0 in | Wt 262.8 lb

## 2017-03-05 DIAGNOSIS — J449 Chronic obstructive pulmonary disease, unspecified: Secondary | ICD-10-CM

## 2017-03-05 DIAGNOSIS — F17210 Nicotine dependence, cigarettes, uncomplicated: Secondary | ICD-10-CM

## 2017-03-05 MED ORDER — PREDNISONE 20 MG PO TABS: ORAL_TABLET | ORAL | 0 refills | Status: DC

## 2017-03-05 MED ORDER — PREDNISONE 10 MG PO TABS: ORAL_TABLET | ORAL | 11 refills | Status: DC

## 2017-03-05 NOTE — Progress Notes (Signed)
Subjective:     Patient ID: Chad Duke, male   DOB: 10-May-1945   MRN: [id number]    Brief patient profile:  80 yowm active smoker with RA on MTX/ remicade  per Chad Duke  g[place] in [place] with breathing problems as child dx as asthma  and never much able to do aerobic activities then in his early 50's got worse and daily inhalers since referred to pulmonary clinic 09/08/2016 by Chad Duke not impressed with any inhalers including symbicort but much better p prednisone for RA with last use summer 2017 with GOLD III criteria 10/10/13.      History of Present Illness  09/08/2016 1st [place] [facility] office visit/ Chad Duke   Chief Complaint  Patient presents with  . Pulmonary Consult    Referred by Chad Duke for eval of abnormal lung screening CT Chest done 07/14/16. Pt states he has known COPD and has had worsening SOB over the past 2 months.  He gets winded with exertion such as taking out the trash or carrying something heavy.    sleeps fine on cpap per Chad Duke x 10-12 years  Doe onset indolent gradually worse = MMRC2 = can't walk a nl pace on a flat grade s sob but does fine slow and flat eg shopping/ on multiple forms saba/sam's smi/ nebs  rec Plan A = Automatic =   Symbicort 160 Take 2 puffs first thing in am and then another 2 puffs about 12 hours later.  Spiriva 2 pffs each am and protonix Take 30-60 min before first meal of the day  Prednisone 10 mg take  4 each am x 2 days,   2 each am x 2 days,  1 each am x 2 days and stop  Plan B = Backup Only use your albuterol/ipatropium as a rescue medication  Plan C = Crisis - only use your albuterol nebulizer if you first try Plan B and it fails to help > ok to use the nebulizer up to every 4 hours but if start needing it regularly call for immediate appointment GERD diet      09/22/2016  f/u ov/Chad Duke re: COPD GOLD  III/ still smoking  Chief Complaint  Patient presents with  . Follow-up    pt states that he has modified regimen  given to him at last OV and this seems to help s/s some.     still sleeping fine on cpap  Only took spiriva x 3 days then stopped and back to overuse of saba/sama smi and neb Much better only while on prednisone  rec Plan A = Automatic =   Symbicort 160 Take 2 puffs first thing in am and then another 2 puffs about 12 hours later.  Spiriva 2 pffs each am and protonix Take 30-60 min before first meal of the day and pepcid 20 mg one at bedtime  Prednisone 10 mg take  4 each am x 2 days,   2 each am x 2 days,  1 each am x 2 days and stop  Plan B = Backup Only use your albuterol/ipatropium as a rescue medication Plan C = Crisis - only use your albuterol nebulizer if you first try Plan B and it fails to help > ok to use the nebulizer up to every 4 hours but if start needing it regularly call for immediate appointment For cough congestion > mucinex 1200 mg every 12 hours as needed      11/04/2016  f/u ov/Chad Duke  re:  GOLD II-III copd /still smoking/ over use of saba/sama continues  Chief Complaint  Patient presents with  . Follow-up    PFT's done today. Breathing is unchanged. He is using combivent 2-3 x per day and his duoneb 4 x daily on average.   still smoking/ severe coughing fits/ thick white mucus  spiriva empty device  Doe = MMRC3 = can't walk 100 yards even at a slow pace at a flat grade s stopping due to sob   Only thing that reduces saba dep is prednisone transiently  rec Work on maintaining perfect inhaler technique:  relax and gently blow all the way out then take a nice smooth deep breath back in, triggering the inhaler at same time you start breathing in.  Hold for up to 5 seconds if you can. Blow symbicort out thru nose. Rinse and gargle with water when done Plan A = Automatic = NO MATTER WHAT=   Symbicort 160 Take 2 puffs first thing in am and then another 2 puffs about 12 hours later and Spiriva each am only  x 2 puffs  Dexilant = protonix 40 mg Take 30-60 min before first meal of  the day and pepcid 20 mg at bedtime  Plan B = Backup only  Only use your albuterol (ventolin) as a rescue medication to be used if you can't catch your breath by resting or doing a relaxed purse lip breathing pattern.  - The less you use it, the better it will work when you need it. - Ok to use the inhaler up to 2 puffs  every 4 hours if you must but call for appointment if use goes up over your usual need - Don't leave home without it !!  (think of it like the spare tire for your car)  Plan C = Crisis only  - only use your albuterol nebulizer if you first try Plan B and it fails to help > ok to use the nebulizer up to every 4 hours but if start needing it regularly call for immediate appointment Plan D = Deltasone 10  - activate if having to use plan C more than usual which should be rare if plan A followed carefully Prednisone 10 mg take  4 each am x 2 days,   2 each am x 2 days,  1 each am x 2 days and stop  For cough/ congestion > mucinex up to 1200 mg every 12 hours as needed and use flutter valve as much possible  Please schedule a follow up office visit in 6 weeks, call sooner if needed with all medications /inhalers/ solutions in hand so we can verify exactly what you are taking. This includes all medications from all doctors and over the counters      03/05/2017  Acute  ov/Chad Duke re: GOLD II/AB  poorly controlled cough / worse sob/ non adherence and still smoking  Chief Complaint  Patient presents with  . Acute Visit    COPD, SOB, lightheadiness, in the ER yesterday  sleeping on cpap ok, problems start after he gets up > severe coughing fits assoc with nasal congestion  Doe= MMRC3 = can't walk 100 yards even at a slow pace at a flat grade s stopping due to sob   Not following instructions re A > D and lost rx's from last ov/  did not keep f/u appt   No obvious day to day or daytime variability or assoc excess/ purulent sputum or mucus plugs or  hemoptysis or cp or chest tightness,  subjective wheeze or overt  hb symptoms. No unusual exposure hx or h/o childhood pna/ asthma or knowledge of premature birth.  Sleeping ok on CPAP  without nocturnal  or early am exacerbation  of respiratory  c/o's or need for noct saba. Also denies any obvious fluctuation of symptoms with weather or environmental changes or other aggravating or alleviating factors except as outlined above   Current Allergies, Complete Past Medical History, Past Surgical History, Family History, and Social History were reviewed in [employer] record.  ROS  The following are not active complaints unless bolded Hoarseness, sore throat, dysphagia, dental problems, itching, sneezing,  nasal congestion or discharge of excess mucus or purulent secretions, ear ache,   fever, chills, sweats, unintended wt loss or wt gain, classically pleuritic or exertional cp,  orthopnea pnd or leg swelling, presyncope, palpitations, abdominal pain, anorexia, nausea, vomiting, diarrhea  or change in bowel habits or change in bladder habits, change in stools or change in urine, dysuria, hematuria,  rash, arthralgias, visual complaints, headache, numbness, weakness or ataxia or problems with walking or coordination,  change in mood/affect or memory.        Current Meds - unable to verify this list is correct   Medication Sig  . albuterol (PROVENTIL HFA;VENTOLIN HFA) 108 (90 Base) MCG/ACT inhaler Inhale 2 puffs into the lungs every 4 (four) hours as needed for wheezing or shortness of breath.  Marland Kitchen albuterol (PROVENTIL) (2.5 MG/3ML) 0.083% nebulizer solution Take 3 mLs (2.5 mg total) by nebulization every 6 (six) hours as needed for wheezing or shortness of breath.  . Ascorbic Acid (VITAMIN C) 1000 MG tablet Take 1,000 mg by mouth daily.  Marland Kitchen aspirin 81 MG tablet Take 162 mg by mouth daily. 2 by mouth daily   . Calcium-Magnesium-Vitamin D (CITRACAL CALCIUM+D) 600-40-500 MG-MG-UNIT TB24 Take 1 tablet by mouth daily.    .  cetirizine (ZYRTEC) 10 MG tablet Take 10 mg by mouth daily.    . colchicine 0.6 MG tablet Take 0.6 mg by mouth daily as needed (gout).   . cyanocobalamin 2000 MCG tablet Take 2,500 mcg by mouth daily.   . famotidine (PEPCID) 20 MG tablet Take 1 tablet (20 mg total) by mouth at bedtime.  . febuxostat (ULORIC) 40 MG tablet Take 40 mg by mouth daily.  . folic acid (FOLVITE) 400 MCG tablet Take 1,200 mcg by mouth daily.  . folic acid (FOLVITE) 800 MCG tablet Take 1,200 mcg by mouth daily.   Marland Kitchen guaiFENesin (MUCINEX) 600 MG 12 hr tablet Take 1,200 mg by mouth 2 (two) times daily.  . InFLIXimab (REMICADE IV) Inject into the vein every 6 (six) weeks.  . methotrexate (RHEUMATREX) 2.5 MG tablet Take 10 mg by mouth once a week. 4 tablets weekly  . Multiple Vitamin (MULTIVITAMIN) tablet Take 1 tablet by mouth daily.    . pantoprazole (PROTONIX) 40 MG tablet Take 40 mg by mouth 2 (two) times daily.   Marland Kitchen Respiratory Therapy Supplies (FLUTTER) DEVI Use as directed  . SYMBICORT 160-4.5 MCG/ACT inhaler Inhale 2 puffs into the lungs 2 (two) times daily. 2 puffs twice daily  . Tiotropium Bromide Monohydrate (SPIRIVA RESPIMAT) 2.5 MCG/ACT AERS Inhale 2 puffs into the lungs daily.  Marland Kitchen triamterene-hydrochlorothiazide (MAXZIDE-25) 37.5-25 MG per tablet Once daily  . [DISCONTINUED] predniSONE (DELTASONE) 20 MG tablet 2 tabs po daily x 3 days starting 03/05/17  . [DISCONTINUED] predniSONE (DELTASONE) 20 MG tablet 2 tabs po daily x  3 days starting 03/05/17                          Objective:   Physical Exam   Obese wm nad at rest   11/04/2016        264  09/22/2016        262   09/08/16 259 lb (117.5 kg)  07/14/16 257 lb (116.6 kg)  07/09/15 249 lb (112.9 kg)    Vital signs reviewed - Note on arrival 02 sats  91% on RA     junky insp and exp rhonchit bilatera 1+ pitting   HEENT: nl dentition, turbinates bilaterally, and oropharynx. Nl external ear canals without cough reflex   NECK :  without  JVD/Nodes/TM/ nl carotid upstrokes bilaterally   LUNGS: no acc muscle use,  Nl contour chest with coarse/ junky insp and exp rhonchi bilaterally    CV:  RRR  no s3 or murmur or increase in P2, and  1+ pitting bilateral ankle edema   ABD:  Tensely obese/    nontender with poor  inspiratory excursion in the supine position. No bruits or organomegaly appreciated, bowel sounds nl  MS:  Nl gait/ ext warm without deformities, calf tenderness, cyanosis or clubbing No obvious joint restrictions   SKIN: warm and dry without lesions    NEURO:  alert, approp, nl sensorium with  no motor or cerebellar deficits apparent.        I personally reviewed images and agree with radiology impression as follows:  CXR:   03/04/17 No active cardiopulmonary disease.   Labs reviewed    Chemistry      Component Value Date/Time   NA 140 03/04/2017 1736   K 4.3 03/04/2017 1736   CL 104 03/04/2017 1736   CO2 27 03/04/2017 1736   BUN 21 (H) 03/04/2017 1736   CREATININE 1.30 (H) 03/04/2017 1736      Component Value Date/Time   CALCIUM 9.5 03/04/2017 1736   ALKPHOS      AST     ALT     BILITOT        Lab Results  Component Value Date   WBC 8.8 03/04/2017   HGB 11.9 (L) 03/04/2017   HCT 36.8 (L) 03/04/2017   MCV 103.4 (H) 03/04/2017   PLT 207 03/04/2017       Assessment:

## 2017-03-05 NOTE — Patient Instructions (Addendum)
Work on maintaining perfect inhaler technique:  relax and gently blow all the way out then take a nice smooth deep breath back in, triggering the inhaler at same time you start breathing in.  Hold for up to 5 seconds if you can. Blow symbicort out thru nose. Rinse and gargle with water when done  Plan A = Automatic = NO MATTER WHAT=   Symbicort 160 Take 2 puffs first thing in am and then another 2 puffs about 12 hours later and Spiriva each am only  x 2 puffs  Protonix 40 mg Take 30- 60 min before your first and last meals of the day    Plan B = Backup only  Only use your albuterol (ventolin = 2 pffs/ combivent one) as a rescue medication to be used if you can't catch your breath by resting or doing a relaxed purse lip breathing pattern.  - The less you use it, the better it will work when you need it. - Ok to use the inhaler up to 2 puffs  every 4 hours if you must but call for appointment if use goes up over your usual need - Don't leave home without it !!  (think of it like the spare tire for your car)   Plan C = Crisis only  - only use your albuterol nebulizer if you first try Plan B and it fails to help > ok to use the nebulizer up to every 4 hours but if start needing it regularly call for immediate appointment   Plan D = Deltasone 10  - activate if having to use plan C more than usual which should be rare if plan A followed carefully Prednisone 10 mg take  4 each am x 2 days,   2 each am x 2 days,  1 each am x 2 days and stop     For cough/ congestion > mucinex up to 1200 mg every 12 hours as needed and use flutter valve as much possible    The key is to stop smoking completely before smoking completely stops you!   Please schedule a follow up office visit in 2 weeks, call sooner if needed with all medications /inhalers/ solutions in hand so we can verify exactly what you are taking. This includes all medications from all doctors and over the counters  under care of cards in [place] ?  Check next ov - nothing in care everywhere   - also consider ct sinus/ hrct

## 2017-03-06 ENCOUNTER — Encounter: Payer: Self-pay | Admitting: Internal Medicine

## 2017-03-06 NOTE — Assessment & Plan Note (Addendum)
Spirometry  10/10/13  FEV1 1.38 (48%)  Ratio 52  - 09/08/2016    try spriiva respimat   - used x 3 days only  - 09/22/2016    rechallenge with spiriva 2.5 x 2 daily  - 11/04/2016  After extensive coaching HFA effectiveness =    90% - PFT's  11/04/2016  FEV1 1.49 (53 % ) ratio 56  p 7 % improvement from saba p spiriva/symb160/combivent/duoneb w/in 2 h  prior to study with DLCO  64/65 % corrects to 87 % for alv volume - added prn prednisone as Plan D 11/04/2016 >>>    Not following as of 03/05/2017 - 03/05/2017  After extensive coaching inhaler device  effectiveness =    75% (short Ti)    DDX of  difficult airways management almost all start with A and  include Adherence, Ace Inhibitors, Acid Reflux, Active Sinus Disease, Alpha 1 Antitripsin deficiency, Anxiety masquerading as Airways dz,  ABPA,  Allergy(esp in young), Aspiration (esp in elderly), Adverse effects of meds,  Active smokers, A bunch of PE's (a small clot burden can't cause this syndrome unless there is already severe underlying pulm or vascular dz with poor reserve) plus two Bs  = Bronchiectasis and Beta blocker use..and one C= CHF  Adherence is always the initial "prime suspect" and is a multilayered concern that requires a "trust but verify" approach in every patient - starting with knowing how to use medications, especially inhalers, correctly, keeping up with refills and understanding the fundamental difference between maintenance and prns vs those medications only taken for a very short course and then stopped and not refilled.  - see hfa teaching - with all meds in hand using a trust but verify approach to confirm accurate Medication  Reconciliation The principal here is that until we are certain that the  patients are doing what we've asked, it makes no sense to ask them to do more.    Active smoking top of the list  ? Acid (or non-acid) GERD > always difficult to exclude as up to 75% of pts in some series report no assoc GI/ Heartburn  symptoms> rec max (24h)  acid suppression and diet restrictions/ reviewed and instructions given in writing.   ? Allergy driving asthmatic component  > continue pred as "plan D"   ? Anxiety/depression  > usually at the bottom of this list of usual suspects but should be much higher on this pt's based on H and P and note already on psychotropics   ? Active sinus CT > consider sinus ct next ov  ? Bronchiectasis >  Empirical rx with mucinex / flutter, consider HRCT next   ? chf > under care of cards in [place] ? Check next ov - nothing in care everywhere     I had an extended discussion with the patient reviewing all relevant studies completed to date and  lasting 25 minutes of a 40  minute acute extended  visit     re  severe non-specific but potentially very serious refractory respiratory symptoms of uncertain and potentially multiple  etiologies.   Each maintenance medication was reviewed in detail including most importantly the difference between maintenance and prns and under what circumstances the prns are to be triggered using an action plan format that is not reflected in the computer generated alphabetically organized AVS.    Please see AVS for specific instructions unique to this office visit that I personally wrote and verbalized to the the pt in  detail and then reviewed with pt  by my nurse highlighting any changes in therapy/plan of care  recommended at today's visit.

## 2017-03-06 NOTE — Assessment & Plan Note (Signed)
Body mass index is 41.16 kg/m.  -  Trending up No results found for: TSH   Contributing to gerd risk/ doe/reviewed the need and the process to achieve and maintain neg calorie balance > defer f/u primary care including intermittently monitoring thyroid status

## 2017-03-06 NOTE — Assessment & Plan Note (Signed)

## 2017-03-15 DIAGNOSIS — M0589 Other rheumatoid arthritis with rheumatoid factor of multiple sites: Secondary | ICD-10-CM | POA: Diagnosis not present

## 2017-03-17 DIAGNOSIS — G4761 Periodic limb movement disorder: Secondary | ICD-10-CM | POA: Diagnosis not present

## 2017-03-17 DIAGNOSIS — J45909 Unspecified asthma, uncomplicated: Secondary | ICD-10-CM | POA: Diagnosis not present

## 2017-03-17 DIAGNOSIS — G4733 Obstructive sleep apnea (adult) (pediatric): Secondary | ICD-10-CM | POA: Diagnosis not present

## 2017-03-17 DIAGNOSIS — I10 Essential (primary) hypertension: Secondary | ICD-10-CM | POA: Diagnosis not present

## 2017-03-17 DIAGNOSIS — R0902 Hypoxemia: Secondary | ICD-10-CM | POA: Diagnosis not present

## 2017-03-17 DIAGNOSIS — J449 Chronic obstructive pulmonary disease, unspecified: Secondary | ICD-10-CM | POA: Diagnosis not present

## 2017-03-29 ENCOUNTER — Encounter: Payer: Self-pay | Admitting: Adult Health

## 2017-03-29 ENCOUNTER — Ambulatory Visit (INDEPENDENT_AMBULATORY_CARE_PROVIDER_SITE_OTHER): Payer: Medicare Other | Admitting: Adult Health

## 2017-03-29 VITALS — BP 124/78 | HR 98 | Ht 67.0 in | Wt 262.0 lb

## 2017-03-29 DIAGNOSIS — J449 Chronic obstructive pulmonary disease, unspecified: Secondary | ICD-10-CM

## 2017-03-29 DIAGNOSIS — R0600 Dyspnea, unspecified: Secondary | ICD-10-CM | POA: Diagnosis not present

## 2017-03-29 DIAGNOSIS — J9819 Other pulmonary collapse: Secondary | ICD-10-CM | POA: Diagnosis not present

## 2017-03-29 DIAGNOSIS — I5189 Other ill-defined heart diseases: Secondary | ICD-10-CM

## 2017-03-29 DIAGNOSIS — F17210 Nicotine dependence, cigarettes, uncomplicated: Secondary | ICD-10-CM | POA: Diagnosis not present

## 2017-03-29 DIAGNOSIS — I519 Heart disease, unspecified: Secondary | ICD-10-CM | POA: Diagnosis not present

## 2017-03-29 DIAGNOSIS — R609 Edema, unspecified: Secondary | ICD-10-CM

## 2017-03-29 NOTE — Progress Notes (Signed)
@Patient  ID: Chad Duke, male    DOB: [birth year], 73 y.o.   MRN: [id number]  Chief Complaint  Patient presents with  . Follow-up    COPD     Referring provider: [name], MD  HPI: 72 yo male active smoker followed for GOLD III COPD .  RA on MTX and Remicade  Childhood asthma  OSA on CPAP -followed by [facility]   03/29/2017 Follow up: GOLD II-III COPD , Smoker  Patient presents for a one-month follow-up.  Patient has underlying severe COPD. Patient continues to smoke 1.5 PPD and vapes e cig . Marland Kitchen  Smoking cessation was discussed. Last ov he was having increased cough and wheezing with doe. He was given prednisone taper , says it does help. Wheezing and increased last week, restarted prednisone taper . Says it is helping with decreased dyspnea.   We reviewed his medications and organize them into a medication encounter with patient education.  Patient appears to be taking them correctly. Gets inhaler  thru patient assistance program   Declines pulmonary rehab .   Has RA on MTX and Remaicade .    Allergies  Allergen Reactions  . Chantix [Varenicline] Other (See Comments)    Suicidal thoughts    Immunization History  Administered Date(s) Administered  . Influenza Split 01/10/2011  . Influenza-Unspecified 12/10/2013, 12/09/2016    Past Medical History:  Diagnosis Date  . AAA (abdominal aortic aneurysm) (HCC)   . Allergy    Rhinitis  . Arthritis    Rhuematoid  . Asthma   . Chronic renal insufficiency   . Diverticulosis   . Emphysema   . GERD (gastroesophageal reflux disease)   . Hearing loss of both ears   . Hiatal hernia   . Hyperlipidemia   . Hypertension   . Male hypogonadism   . Nephrolithiasis   . Obstructive sleep apnea   . Sleep apnea    On CPAP  . Stasis dermatitis   . Varicose veins   . Vitamin B 12 deficiency     Tobacco History: Social History   Tobacco Use  Smoking Status Current Every Day Smoker  . Packs/day: 1.50  . Years: 53.00   . Pack years: 79.50  . Types: E-cigarettes  Smokeless Tobacco Never Used  Tobacco Comment   1-1 1/2 pk per day   Ready to quit: No Counseling given: Yes Comment: 1-1 1/2 pk per day   Outpatient Encounter Medications as of 03/29/2017  Medication Sig  . albuterol (PROVENTIL HFA;VENTOLIN HFA) 108 (90 Base) MCG/ACT inhaler Inhale 2 puffs into the lungs every 4 (four) hours as needed for wheezing or shortness of breath.  Marland Kitchen albuterol (PROVENTIL) (2.5 MG/3ML) 0.083% nebulizer solution Take 3 mLs (2.5 mg total) by nebulization every 6 (six) hours as needed for wheezing or shortness of breath.  . Ascorbic Acid (VITAMIN C) 1000 MG tablet Take 1,000 mg by mouth daily.  Marland Kitchen aspirin 81 MG tablet Take 162 mg by mouth daily. 2 by mouth daily   . Calcium-Magnesium-Vitamin D (CITRACAL CALCIUM+D) 600-40-500 MG-MG-UNIT TB24 Take 1 tablet by mouth daily.    . cetirizine (ZYRTEC) 10 MG tablet Take 10 mg by mouth daily.    . colchicine 0.6 MG tablet Take 0.6 mg by mouth daily as needed (gout).   . cyanocobalamin 2000 MCG tablet Take 2,500 mcg by mouth daily.   . famotidine (PEPCID) 20 MG tablet Take 1 tablet (20 mg total) by mouth at bedtime.  . febuxostat (ULORIC) 40 MG  tablet Take 40 mg by mouth daily.  . folic acid (FOLVITE) 400 MCG tablet Take 1,200 mcg by mouth daily.  . folic acid (FOLVITE) 800 MCG tablet Take 1,200 mcg by mouth daily.   Marland Kitchen guaiFENesin (MUCINEX) 600 MG 12 hr tablet Take 1,200 mg by mouth 2 (two) times daily.  . InFLIXimab (REMICADE IV) Inject into the vein every 6 (six) weeks.  . methotrexate (RHEUMATREX) 2.5 MG tablet Take 10 mg by mouth once a week. 4 tablets weekly  . Multiple Vitamin (MULTIVITAMIN) tablet Take 1 tablet by mouth daily.    . pantoprazole (PROTONIX) 40 MG tablet Take 40 mg by mouth 2 (two) times daily.   . predniSONE (DELTASONE) 10 MG tablet Take  4 each am x 2 days,   2 each am x 2 days,  1 each am x 2 days and stop  . Respiratory Therapy Supplies (FLUTTER) DEVI Use  as directed  . SYMBICORT 160-4.5 MCG/ACT inhaler Inhale 2 puffs into the lungs 2 (two) times daily. 2 puffs twice daily  . Tiotropium Bromide Monohydrate (SPIRIVA RESPIMAT) 2.5 MCG/ACT AERS Inhale 2 puffs into the lungs daily.  Marland Kitchen triamterene-hydrochlorothiazide (MAXZIDE-25) 37.5-25 MG per tablet Once daily   No facility-administered encounter medications on file as of 03/29/2017.      Review of Systems  Constitutional:   No  weight loss, night sweats,  Fevers, chills +, fatigue, or  lassitude.  HEENT:   No headaches,  Difficulty swallowing,  Tooth/dental problems, or  Sore throat,                No sneezing, itching, ear ache, nasal congestion, post nasal drip,   CV:  No chest pain,  Orthopnea, PND, swelling in lower extremities, anasarca, dizziness, palpitations, syncope.   GI  No heartburn, indigestion, abdominal pain, nausea, vomiting, diarrhea, change in bowel habits, loss of appetite, bloody stools.   Resp:   No chest wall deformity  Skin: no rash or lesions.  GU: no dysuria, change in color of urine, no urgency or frequency.  No flank pain, no hematuria   MS:  No joint pain or swelling.  No decreased range of motion.  No back pain.    Physical Exam  BP 124/78 (BP Location: Left Arm, Cuff Size: Normal)   Pulse 98   Ht 5\' 7"  (1.702 m)   Wt 262 lb (118.8 kg)   SpO2 96%   BMI 41.04 kg/m   GEN: A/Ox3; pleasant , NAD, obese    HEENT:  [place]/AT,  EACs-clear, TMs-wnl, NOSE-clear, THROAT-clear, no lesions, no postnasal drip or exudate noted.   NECK:  Supple w/ fair ROM; no JVD; normal carotid impulses w/o bruits; no thyromegaly or nodules palpated; no lymphadenopathy.    RESP  Few trace rhonchi ,  no accessory muscle use, no dullness to percussion  CARD:  RRR, no m/r/g, 1-2 + peripheral edema, pulses intact, no cyanosis or clubbing.  GI:   Soft & nt; nml bowel sounds; no organomegaly or masses detected.   Musco: Warm bil, no deformities or joint swelling noted.    Neuro: alert, no focal deficits noted.    Skin: Warm, no lesions or rashes    Lab Results:  CBC    Component Value Date/Time   WBC 8.8 03/04/2017 1736   RBC 3.56 (L) 03/04/2017 1736   HGB 11.9 (L) 03/04/2017 1736   HCT 36.8 (L) 03/04/2017 1736   PLT 207 03/04/2017 1736   MCV 103.4 (H) 03/04/2017 1736  MCH 33.4 03/04/2017 1736   MCHC 32.3 03/04/2017 1736   RDW 16.2 (H) 03/04/2017 1736   LYMPHSABS 3.0 03/28/2007 1330   MONOABS 0.8 03/28/2007 1330   EOSABS 0.3 03/28/2007 1330   BASOSABS 0.0 03/28/2007 1330    BMET    Component Value Date/Time   NA 140 03/04/2017 1736   K 4.3 03/04/2017 1736   CL 104 03/04/2017 1736   CO2 27 03/04/2017 1736   GLUCOSE 139 (H) 03/04/2017 1736   BUN 21 (H) 03/04/2017 1736   CREATININE 1.30 (H) 03/04/2017 1736   CALCIUM 9.5 03/04/2017 1736   GFRNONAA 53 (L) 03/04/2017 1736   GFRAA >60 03/04/2017 1736    BNP No results found for: BNP  ProBNP No results found for: PROBNP  Imaging: Dg Chest 2 View  Result Date: 03/04/2017 CLINICAL DATA:  Shortness of breath EXAM: CHEST  2 VIEW COMPARISON:  09/22/2016 FINDINGS: Partially visualized left shoulder replacement. No acute pulmonary infiltrate or effusion. Stable cardiomediastinal silhouette. No pneumothorax. IMPRESSION: No active cardiopulmonary disease. Electronically Signed   By: Jasmine Pang M.D.   On: 03/04/2017 20:33     Assessment & Plan:   COPD GOLD III  Frequent exacerbations - with ongoing smoking  Patient's medications were reviewed today and patient education was given. Computerized medication calendar was adjusted/completed  Check HRCT chest as underlying RA on MTX and Remicde , along with heavy smoking  Check echo as 1-2 + edema suspect has component of diastolic dysfunction   olan  Patient Instructions  Follow med calendar closely and bring to each visit .  Work on not smoking  Set up for HRCT chest .  Set up for 2 D echo .  Follow up with Dr. Sherene Sires  In 2 months  and As needed   Please contact office for sooner follow up if symptoms do not improve or worsen or seek emergency care      Cigarette smoker smoknng cessation    Right middle lobe syndrome Check HRCT chest .      Rubye Oaks, NP 03/29/2017

## 2017-03-29 NOTE — Assessment & Plan Note (Signed)
smoknng cessation

## 2017-03-29 NOTE — Assessment & Plan Note (Signed)
Frequent exacerbations - with ongoing smoking  Patient's medications were reviewed today and patient education was given. Computerized medication calendar was adjusted/completed  Check HRCT chest as underlying RA on MTX and Remicde , along with heavy smoking  Check echo as 1-2 + edema suspect has component of diastolic dysfunction   olan  Patient Instructions  Follow med calendar closely and bring to each visit .  Work on not smoking  Set up for HRCT chest .  Set up for 2 D echo .  Follow up with Dr. Sherene Sires  In 2 months and As needed   Please contact office for sooner follow up if symptoms do not improve or worsen or seek emergency care

## 2017-03-29 NOTE — Patient Instructions (Addendum)
Follow med calendar closely and bring to each visit .  Work on not smoking  Set up for HRCT chest .  Set up for 2 D echo .  Follow up with Dr. Sherene Sires  In 2 months and As needed   Please contact office for sooner follow up if symptoms do not improve or worsen or seek emergency care

## 2017-03-29 NOTE — Assessment & Plan Note (Signed)
Check HRCT chest  

## 2017-03-30 ENCOUNTER — Encounter: Payer: Medicare Other | Admitting: Adult Health

## 2017-03-31 DIAGNOSIS — J449 Chronic obstructive pulmonary disease, unspecified: Secondary | ICD-10-CM | POA: Diagnosis not present

## 2017-03-31 DIAGNOSIS — G4733 Obstructive sleep apnea (adult) (pediatric): Secondary | ICD-10-CM | POA: Diagnosis not present

## 2017-03-31 DIAGNOSIS — I10 Essential (primary) hypertension: Secondary | ICD-10-CM | POA: Diagnosis not present

## 2017-03-31 DIAGNOSIS — R0902 Hypoxemia: Secondary | ICD-10-CM | POA: Diagnosis not present

## 2017-04-05 ENCOUNTER — Other Ambulatory Visit: Payer: Self-pay

## 2017-04-05 ENCOUNTER — Ambulatory Visit (HOSPITAL_COMMUNITY): Payer: Medicare Other | Attending: Cardiovascular Disease

## 2017-04-05 ENCOUNTER — Ambulatory Visit (INDEPENDENT_AMBULATORY_CARE_PROVIDER_SITE_OTHER)
Admission: RE | Admit: 2017-04-05 | Discharge: 2017-04-05 | Disposition: A | Discharge status: Home / Self Care | Payer: Medicare Other | Source: Ambulatory Visit | Attending: Adult Health | Admitting: Adult Health

## 2017-04-05 ENCOUNTER — Encounter (INDEPENDENT_AMBULATORY_CARE_PROVIDER_SITE_OTHER): Payer: Self-pay

## 2017-04-05 DIAGNOSIS — J449 Chronic obstructive pulmonary disease, unspecified: Secondary | ICD-10-CM | POA: Insufficient documentation

## 2017-04-05 DIAGNOSIS — R0600 Dyspnea, unspecified: Secondary | ICD-10-CM | POA: Insufficient documentation

## 2017-04-05 DIAGNOSIS — R0602 Shortness of breath: Secondary | ICD-10-CM | POA: Diagnosis not present

## 2017-04-07 ENCOUNTER — Telehealth: Payer: Self-pay | Admitting: Internal Medicine

## 2017-04-07 NOTE — Telephone Encounter (Signed)
Notes recorded by Julio Sicks, NP on 04/06/2017 at 4:58 PM EST NO ILD ,  Chronic RML collapse, unchanged  Continue w/ ov recs  Please contact office for sooner follow up if symptoms do not improve or worsen or seek emergency care    Does show some coronary plaque buildup , discuss with PCP if further evaluation needs to be done .  ---------------------------------------------------------- Spoke with pt. He is aware of results. Nothing further was needed.

## 2017-04-07 NOTE — Progress Notes (Signed)
LMTCB on preferred phone number listed for patient. 

## 2017-04-07 NOTE — Addendum Note (Signed)
Addended by: Etheleen Mayhew C on: 04/07/2017 12:08 PM   Modules accepted: Orders

## 2017-04-22 DIAGNOSIS — N183 Chronic kidney disease, stage 3 (moderate): Secondary | ICD-10-CM | POA: Diagnosis not present

## 2017-04-22 DIAGNOSIS — M542 Cervicalgia: Secondary | ICD-10-CM | POA: Diagnosis not present

## 2017-04-22 DIAGNOSIS — Z6841 Body Mass Index (BMI) 40.0 and over, adult: Secondary | ICD-10-CM | POA: Diagnosis not present

## 2017-04-22 DIAGNOSIS — M0589 Other rheumatoid arthritis with rheumatoid factor of multiple sites: Secondary | ICD-10-CM | POA: Diagnosis not present

## 2017-04-22 DIAGNOSIS — M1A09X0 Idiopathic chronic gout, multiple sites, without tophus (tophi): Secondary | ICD-10-CM | POA: Diagnosis not present

## 2017-05-05 DIAGNOSIS — R7303 Prediabetes: Secondary | ICD-10-CM | POA: Diagnosis not present

## 2017-05-05 DIAGNOSIS — Z87891 Personal history of nicotine dependence: Secondary | ICD-10-CM | POA: Diagnosis not present

## 2017-05-05 DIAGNOSIS — I10 Essential (primary) hypertension: Secondary | ICD-10-CM | POA: Diagnosis not present

## 2017-05-05 DIAGNOSIS — F17200 Nicotine dependence, unspecified, uncomplicated: Secondary | ICD-10-CM | POA: Diagnosis not present

## 2017-05-05 DIAGNOSIS — E7800 Pure hypercholesterolemia, unspecified: Secondary | ICD-10-CM | POA: Diagnosis not present

## 2017-05-05 DIAGNOSIS — M109 Gout, unspecified: Secondary | ICD-10-CM | POA: Diagnosis not present

## 2017-05-06 DIAGNOSIS — M0589 Other rheumatoid arthritis with rheumatoid factor of multiple sites: Secondary | ICD-10-CM | POA: Diagnosis not present

## 2017-05-06 DIAGNOSIS — Z79899 Other long term (current) drug therapy: Secondary | ICD-10-CM | POA: Diagnosis not present

## 2017-05-13 NOTE — Progress Notes (Signed)
Referring-Dr [name] Reason for referral-CAD  HPI: Abdominal ultrasound 6/18 showed AAA 2.8 x 3.3 cm. Echo 2/19 showed normal LV function; mild diastolic dysfunction.. Chest CT 2/19 showed right middle lobe collapse.  There was note of three-vessel coronary calcification.  Cardiology asked to evaluate.  He does have dyspnea on exertion but no orthopnea or PND.  Chronic pedal edema.  No chest pain, palpitations or syncope.  Current Outpatient Medications  Medication Sig Dispense Refill  . albuterol (PROVENTIL HFA;VENTOLIN HFA) 108 (90 Base) MCG/ACT inhaler Inhale 2 puffs into the lungs every 4 (four) hours as needed for wheezing or shortness of breath. 3 Inhaler 0  . albuterol (PROVENTIL) (2.5 MG/3ML) 0.083% nebulizer solution Take 3 mLs (2.5 mg total) by nebulization every 6 (six) hours as needed for wheezing or shortness of breath. 75 mL 12  . Ascorbic Acid (VITAMIN C) 1000 MG tablet Take 1,000 mg by mouth daily.    Marland Kitchen aspirin EC 81 MG tablet Take 81 mg by mouth daily.    . Calcium-Magnesium-Vitamin D (CITRACAL CALCIUM+D) 600-40-500 MG-MG-UNIT TB24 Take 1 tablet by mouth daily.      . cetirizine (ZYRTEC) 10 MG tablet Take 10 mg by mouth daily.      . cyanocobalamin 2000 MCG tablet Take 2,500 mcg by mouth daily.     . febuxostat (ULORIC) 40 MG tablet Take 40 mg by mouth daily.    Marland Kitchen guaiFENesin (MUCINEX) 600 MG 12 hr tablet Take 1,200 mg by mouth 2 (two) times daily.    . InFLIXimab (REMICADE IV) Inject into the vein every 6 (six) weeks.    . Ipratropium-Albuterol (COMBIVENT RESPIMAT) 20-100 MCG/ACT AERS respimat Inhale 1 puff into the lungs every 6 (six) hours.    . methotrexate (RHEUMATREX) 2.5 MG tablet Take 10 mg by mouth once a week. 4 tablets weekly    . Multiple Vitamin (MULTIVITAMIN) tablet Take 1 tablet by mouth daily.      . pantoprazole (PROTONIX) 40 MG tablet Take 40 mg by mouth 2 (two) times daily as needed.     . predniSONE (DELTASONE) 20 MG tablet Take 20 mg by mouth 2 (two)  times daily with a meal.    . Respiratory Therapy Supplies (FLUTTER) DEVI Use as directed 1 each 0  . SYMBICORT 160-4.5 MCG/ACT inhaler Inhale 2 puffs into the lungs 2 (two) times daily. 2 puffs twice daily    . Tiotropium Bromide Monohydrate (SPIRIVA RESPIMAT) 2.5 MCG/ACT AERS Inhale 2 puffs into the lungs daily. 3 Inhaler 3  . triamterene-hydrochlorothiazide (MAXZIDE-25) 37.5-25 MG per tablet Once daily    . atorvastatin (LIPITOR) 40 MG tablet Take 1 tablet (40 mg total) by mouth daily. 90 tablet 3   No current facility-administered medications for this visit.     Allergies  Allergen Reactions  . Chantix [Varenicline] Other (See Comments)    Suicidal thoughts     Past Medical History:  Diagnosis Date  . AAA (abdominal aortic aneurysm) (HCC)   . Allergy    Rhinitis  . Arthritis    Rhuematoid  . Asthma   . Chronic renal insufficiency   . Diverticulosis   . Emphysema   . GERD (gastroesophageal reflux disease)   . Hearing loss of both ears   . Hiatal hernia   . Hyperlipidemia   . Hypertension   . Male hypogonadism   . Nephrolithiasis   . Nephrolithiasis   . Sleep apnea    On CPAP  . Stasis dermatitis   .  Varicose veins   . Vitamin B 12 deficiency     Past Surgical History:  Procedure Laterality Date  . CARPAL TUNNEL RELEASE     bilateral  . FRACTURE SURGERY Left    Fibula  . JOINT REPLACEMENT Left    Shoulder  . JOINT REPLACEMENT Left    Hip   . KIDNEY STONE SURGERY  2003  . left hip replacement    . left knee    . left shoulder    . LITHOTRIPSY      Social History   Socioeconomic History  . Marital status: Divorced    Spouse name: Not on file  . Number of children: 2  . Years of education: Not on file  . Highest education level: Not on file  Occupational History  . Not on file  Social Needs  . Financial resource strain: Not on file  . Food insecurity:    Worry: Not on file    Inability: Not on file  . Transportation needs:    Medical: Not on  file    Non-medical: Not on file  Tobacco Use  . Smoking status: Current Every Day Smoker    Packs/day: 1.50    Years: 53.00    Pack years: 79.50    Types: E-cigarettes  . Smokeless tobacco: Never Used  . Tobacco comment: 1-1 1/2 pk per day  Substance and Sexual Activity  . Alcohol use: Yes    Alcohol/week: 0.0 oz    Comment: Occasional  . Drug use: No  . Sexual activity: Not on file  Lifestyle  . Physical activity:    Days per week: Not on file    Minutes per session: Not on file  . Stress: Not on file  Relationships  . Social connections:    Talks on phone: Not on file    Gets together: Not on file    Attends religious service: Not on file    Active member of club or organization: Not on file    Attends meetings of clubs or organizations: Not on file    Relationship status: Not on file  . Intimate partner violence:    Fear of current or ex partner: Not on file    Emotionally abused: Not on file    Physically abused: Not on file    Forced sexual activity: Not on file  Other Topics Concern  . Not on file  Social History Narrative  . Not on file    Family History  Problem Relation Age of Onset  . Emphysema Father   . Asthma Father   . Osteoarthritis Father   . Heart attack Mother   . Uterine cancer Mother   . Heart disease Mother   . ADD / ADHD Son   . Depression Son   . Hypertension Brother   . Heart disease Brother        Pacemaker   . Heart murmur Brother     ROS: Arthralgias but no fevers or chills, productive cough, hemoptysis, dysphasia, odynophagia, melena, hematochezia, dysuria, hematuria, rash, seizure activity, orthopnea, PND, claudication. Remaining systems are negative.  Physical Exam:   Blood pressure (!) 104/58, pulse 100, height 5\' 7"  (1.702 m), weight 259 lb 9.6 oz (117.8 kg), SpO2 94 %.  General:  Well developed/obese in NAD Skin warm/dry Patient not depressed No peripheral clubbing Back-normal HEENT-normal/normal eyelids Neck  supple/normal carotid upstroke bilaterally; no bruits; no JVD; no thyromegaly chest - Diminished BS throughout with mild exp wheeze CV -  RRR/normal S1 and S2; no murmurs, rubs or gallops;  PMI nondisplaced Abdomen -NT/ND, no HSM, no mass, + bowel sounds, no bruit 2+ femoral pulses, no bruits Ext-1+ edema, no chords Neuro-grossly nonfocal  ECG -March 04, 2017-sinus rhythm, right bundle branch block, left anterior fascicular block, cannot rule out prior inferior infarct.  Personally reviewed  A/P  1 coronary artery disease-patient noted to have coronary calcification on CT scan.  We will treat with aspirin 81 mg daily and I will add Lipitor 40 mg daily.  He has dyspnea on exertion likely secondary to lung disease.  However we will arrange a Lexiscan nuclear study to screen for ischemia/anginal equivalent.  2 tobacco abuse-patient counseled on discontinuing.  3 hyperlipidemia-add Lipitor 40 mg daily.  Check lipids and liver in 4 weeks.  4  hypertension-blood pressure is controlled.  Continue present medications.  5 abdominal aortic aneurysm-plan follow-up ultrasound June 2019.  Olga Millers, MD

## 2017-05-18 ENCOUNTER — Other Ambulatory Visit: Payer: Self-pay | Admitting: *Deleted

## 2017-05-18 ENCOUNTER — Encounter: Payer: Self-pay | Admitting: Cardiology

## 2017-05-18 ENCOUNTER — Ambulatory Visit (INDEPENDENT_AMBULATORY_CARE_PROVIDER_SITE_OTHER): Payer: Medicare Other | Admitting: Cardiology

## 2017-05-18 VITALS — BP 104/58 | HR 100 | Ht 67.0 in | Wt 259.6 lb

## 2017-05-18 DIAGNOSIS — I2510 Atherosclerotic heart disease of native coronary artery without angina pectoris: Secondary | ICD-10-CM | POA: Diagnosis not present

## 2017-05-18 DIAGNOSIS — I10 Essential (primary) hypertension: Secondary | ICD-10-CM

## 2017-05-18 DIAGNOSIS — I2584 Coronary atherosclerosis due to calcified coronary lesion: Principal | ICD-10-CM

## 2017-05-18 DIAGNOSIS — I714 Abdominal aortic aneurysm, without rupture, unspecified: Secondary | ICD-10-CM

## 2017-05-18 DIAGNOSIS — E7800 Pure hypercholesterolemia, unspecified: Secondary | ICD-10-CM | POA: Diagnosis not present

## 2017-05-18 MED ORDER — ATORVASTATIN CALCIUM 40 MG PO TABS: 40.0000 mg | ORAL_TABLET | Freq: Every day | ORAL | 3 refills | Status: DC

## 2017-05-18 NOTE — Patient Instructions (Signed)
Medication Instructions:   START ATORVASTATIN 40 MG ONCE DAILY  Labwork:  Your physician recommends that you return for lab work in: 4 WEEKS PRIOR TO EATING  Testing/Procedures:  Your physician has requested that you have a lexiscan myoview. For further information please visit [email or web address] Please follow instruction sheet, as given. TAKE ALL MEDICATIONS  Your physician has requested that you have an abdominal aorta duplex. During this test, an ultrasound is used to evaluate the aorta. Allow 30 minutes for this exam. Do not eat after midnight the day before and avoid carbonated beverages SCHEDULE IN JUNE    Follow-Up:  Your physician wants you to follow-up in: 6 MONTHS WITH DR Jens Som You will receive a reminder letter in the mail two months in advance. If you don't receive a letter, please call our office to schedule the follow-up appointment.   If you need a refill on your cardiac medications before your next appointment, please call your pharmacy.

## 2017-05-26 ENCOUNTER — Telehealth: Payer: Self-pay | Admitting: Internal Medicine

## 2017-05-26 MED ORDER — TIOTROPIUM BROMIDE MONOHYDRATE 2.5 MCG/ACT IN AERS: 2.0000 | INHALATION_SPRAY | Freq: Every day | RESPIRATORY_TRACT | 11 refills | Status: DC

## 2017-05-26 MED ORDER — BUDESONIDE-FORMOTEROL FUMARATE 160-4.5 MCG/ACT IN AERO: 2.0000 | INHALATION_SPRAY | Freq: Two times a day (BID) | RESPIRATORY_TRACT | 11 refills | Status: DC

## 2017-05-26 NOTE — Telephone Encounter (Signed)
Spoke with pt. He is needing refills on Symbicort and Spiriva Respimat. Rxs have been sent in. Nothing further was needed.

## 2017-05-26 NOTE — Telephone Encounter (Signed)
Called and spoke with patient, he states that Karin Golden told him that he was needing a PA.   Patients silver scripts ID is [id number] RxBin is [id number] RxGrp is [id number]   Unable to do PA over CMM due to medication being excluded. Called [facility] and spoke with Thayer Ohm. They are faxing over a form for [place] to fill out. Will leave this open to await form.

## 2017-05-31 ENCOUNTER — Other Ambulatory Visit: Payer: Medicare Other

## 2017-05-31 ENCOUNTER — Ambulatory Visit (INDEPENDENT_AMBULATORY_CARE_PROVIDER_SITE_OTHER): Payer: Medicare Other | Admitting: Internal Medicine

## 2017-05-31 ENCOUNTER — Encounter: Payer: Self-pay | Admitting: Internal Medicine

## 2017-05-31 VITALS — BP 110/64 | HR 86 | Ht 67.0 in | Wt 256.0 lb

## 2017-05-31 DIAGNOSIS — I2510 Atherosclerotic heart disease of native coronary artery without angina pectoris: Secondary | ICD-10-CM

## 2017-05-31 DIAGNOSIS — J9819 Other pulmonary collapse: Secondary | ICD-10-CM

## 2017-05-31 DIAGNOSIS — I2584 Coronary atherosclerosis due to calcified coronary lesion: Secondary | ICD-10-CM

## 2017-05-31 DIAGNOSIS — F17210 Nicotine dependence, cigarettes, uncomplicated: Secondary | ICD-10-CM

## 2017-05-31 DIAGNOSIS — J449 Chronic obstructive pulmonary disease, unspecified: Secondary | ICD-10-CM | POA: Diagnosis not present

## 2017-05-31 MED ORDER — PREDNISONE 20 MG PO TABS
ORAL_TABLET | ORAL | 1 refills | Status: DC
Start: 2017-05-31 — End: 2017-07-23

## 2017-05-31 MED ORDER — TIOTROPIUM BROMIDE MONOHYDRATE 2.5 MCG/ACT IN AERS: 2.0000 | INHALATION_SPRAY | Freq: Every day | RESPIRATORY_TRACT | 0 refills | Status: DC

## 2017-05-31 NOTE — Progress Notes (Signed)
Subjective:     Patient ID: Chad Duke, male   DOB: [birth year]   MRN: [id number]    Brief patient profile:  74 yowm active smoker with RA on MTX/ remicade  per Dierdre Forth  g[place] in [place] with breathing problems as child dx as asthma  and never much able to do aerobic activities then in his early 50's got worse and daily inhalers since referred to pulmonary clinic 09/08/2016 by Dr Renne Crigler not impressed with any inhalers including symbicort but much better p prednisone for RA  rxd summer 2017 with GOLD III criteria 10/10/13.      History of Present Illness  09/08/2016 1st [facility] office visit/ Shreeya Recendiz   Chief Complaint  Patient presents with  . Pulmonary Consult    Referred by Kandice Robinsons, NP for eval of abnormal lung screening CT Chest done 07/14/16. Pt states he has known COPD and has had worsening SOB over the past 2 months.  He gets winded with exertion such as taking out the trash or carrying something heavy.    sleeps fine on cpap per Dr Renne Crigler x 10-12 years  Doe onset indolent gradually worse = MMRC2 = can't walk a nl pace on a flat grade s sob but does fine slow and flat eg shopping/ on multiple forms saba/sam's smi/ nebs  rec Plan A = Automatic =   Symbicort 160 Take 2 puffs first thing in am and then another 2 puffs about 12 hours later.  Spiriva 2 pffs each am and protonix Take 30-60 min before first meal of the day  Prednisone 10 mg take  4 each am x 2 days,   2 each am x 2 days,  1 each am x 2 days and stop  Plan B = Backup Only use your albuterol/ipatropium as a rescue medication  Plan C = Crisis - only use your albuterol nebulizer if you first try Plan B and it fails to help > ok to use the nebulizer up to every 4 hours but if start needing it regularly call for immediate appointment GERD diet      09/22/2016  f/u ov/Nicholos Aloisi re: COPD GOLD  III/ still smoking  Chief Complaint  Patient presents with  . Follow-up    pt states that he has modified regimen given to  him at last OV and this seems to help s/s some.     still sleeping fine on cpap  Only took spiriva x 3 days then stopped and back to overuse of saba/sama smi and neb Much better only while on prednisone  rec Plan A = Automatic =   Symbicort 160 Take 2 puffs first thing in am and then another 2 puffs about 12 hours later.  Spiriva 2 pffs each am and protonix Take 30-60 min before first meal of the day and pepcid 20 mg one at bedtime  Prednisone 10 mg take  4 each am x 2 days,   2 each am x 2 days,  1 each am x 2 days and stop  Plan B = Backup Only use your albuterol/ipatropium as a rescue medication Plan C = Crisis - only use your albuterol nebulizer if you first try Plan B and it fails to help > ok to use the nebulizer up to every 4 hours but if start needing it regularly call for immediate appointment For cough congestion > mucinex 1200 mg every 12 hours as needed      11/04/2016  f/u ov/Jalaya Sarver re:  GOLD II-III copd /still smoking/ over use of saba/sama continues  Chief Complaint  Patient presents with  . Follow-up    PFT's done today. Breathing is unchanged. He is using combivent 2-3 x per day and his duoneb 4 x daily on average.   still smoking/ severe coughing fits/ thick white mucus  spiriva empty device  Doe = MMRC3 = can't walk 100 yards even at a slow pace at a flat grade s stopping due to sob   Only thing that reduces saba dep is prednisone transiently  rec Work on maintaining perfect inhaler technique:  relax and gently blow all the way out then take a nice smooth deep breath back in, triggering the inhaler at same time you start breathing in.  Hold for up to 5 seconds if you can. Blow symbicort out thru nose. Rinse and gargle with water when done Plan A = Automatic = NO MATTER WHAT=   Symbicort 160 Take 2 puffs first thing in am and then another 2 puffs about 12 hours later and Spiriva each am only  x 2 puffs  Dexilant = protonix 40 mg Take 30-60 min before first meal of the day  and pepcid 20 mg at bedtime  Plan B = Backup only  Only use your albuterol (ventolin) as a rescue medication to be used if you can't catch your breath by resting or doing a relaxed purse lip breathing pattern.  - The less you use it, the better it will work when you need it. - Ok to use the inhaler up to 2 puffs  every 4 hours if you must but call for appointment if use goes up over your usual need - Don't leave home without it !!  (think of it like the spare tire for your car)  Plan C = Crisis only  - only use your albuterol nebulizer if you first try Plan B and it fails to help > ok to use the nebulizer up to every 4 hours but if start needing it regularly call for immediate appointment Plan D = Deltasone 10  - activate if having to use plan C more than usual which should be rare if plan A followed carefully Prednisone 10 mg take  4 each am x 2 days,   2 each am x 2 days,  1 each am x 2 days and stop  For cough/ congestion > mucinex up to 1200 mg every 12 hours as needed and use flutter valve as much possible  Please schedule a follow up office visit in 6 weeks, call sooner if needed with all medications /inhalers/ solutions in hand so we can verify exactly what you are taking. This includes all medications from all doctors and over the counters      03/05/2017  Acute  ov/Tobby Fawcett re: GOLD II/AB  poorly controlled cough / worse sob/ non adherence and still smoking  Chief Complaint  Patient presents with  . Acute Visit    COPD, SOB, lightheadiness, in the ER yesterday  sleeping on cpap ok, problems start after he gets up > severe coughing fits assoc with nasal congestion  Doe= MMRC3 = can't walk 100 yards even at a slow pace at a flat grade s stopping due to sob   Not following instructions re A > D and lost rx's from last ov/  did not keep f/u appt  Sleeping ok on CPAP    rec Work on maintaining perfect inhaler technique:  Plan A = Automatic =  NO MATTER WHAT=   Symbicort 160 Take 2 puffs  first thing in am and then another 2 puffs about 12 hours later and Spiriva each am only  x 2 puffs  Protonix 40 mg Take 30- 60 min before your first and last meals of the day  Plan B = Backup only  Only use your albuterol (ventolin = 2 pffs/ combivent one) as a rescue medication  Plan C = Crisis only  - only use your albuterol nebulizer if you first try Plan B  Plan D = Deltasone 10  - activate if having to use plan C more than usual which should be rare if plan A followed carefully Prednisone 10 mg take  4 each am x 2 days,   2 each am x 2 days,  1 each am x 2 days and stop  For cough/ congestion > mucinex up to 1200 mg every 12 hours as needed and use flutter valve as much possible  The key is to stop smoking completely before smoking completely stops you!    NP 03/29/17  Follow med calendar closely and bring to each visit .  Work on not smoking        05/31/2017  f/u ov/Stephaun Million re:  GOLD II/ ab/ non adherent / did not bring meds or med calendar and still smoking Chief Complaint  Patient presents with  . Follow-up    Breathing has been worse over the past 2 wks. He states he feels SOB "all the time" worse at night and having tyo use the albuterol or combivent every night. He is using his Duoneb 3 x daily.   Dyspnea: MMRC3 = can't walk 100 yards even at a slow pace at a flat grade s stopping due to sob   Cough: not able to expectorate any but "feels lots of throat congestion and drainage"  Sleep: bipap ok per [facility] sleep doc and also on hs  1lpm  SABA use:  Over use noted   Ran out of spiriva one week prior to OV / started on pred 2 d  prior to OV    No obvious day to day or daytime variability or assoc excess/ purulent sputum or mucus plugs or hemoptysis or cp or chest tightness, subjective wheeze or overt sinus or hb symptoms. No unusual exposure hx or h/o childhood pna/ asthma or knowledge of premature birth.  Sleeping ok on cpap and 1lpm   without nocturnal  or early am exacerbation   of respiratory  c/o's or need for noct saba. Also denies any obvious fluctuation of symptoms with weather or environmental changes or other aggravating or alleviating factors except as outlined above   Current Allergies, Complete Past Medical History, Past Surgical History, Family History, and Social History were reviewed in [employer] record.  ROS  The following are not active complaints unless bolded Hoarseness, sore throat, dysphagia, dental problems, itching, sneezing,  nasal congestion or discharge of excess mucus or purulent secretions, ear ache,   fever, chills, sweats, unintended wt loss or wt gain, classically pleuritic or exertional cp,  orthopnea pnd or arm/hand swelling  or leg swelling, presyncope, palpitations, abdominal pain, anorexia, nausea, vomiting, diarrhea  or change in bowel habits or change in bladder habits, change in stools or change in urine, dysuria, hematuria,  rash, arthralgias, visual complaints, headache, numbness, weakness or ataxia or problems with walking or coordination,  change in mood or  memory.        Current Meds  Medication Sig  . albuterol (PROVENTIL HFA;VENTOLIN HFA) 108 (90 Base) MCG/ACT inhaler Inhale 2 puffs into the lungs every 4 (four) hours as needed for wheezing or shortness of breath.  . Ascorbic Acid (VITAMIN C) 1000 MG tablet Take 1,000 mg by mouth daily.  Marland Kitchen aspirin EC 81 MG tablet Take 81 mg by mouth daily.  Marland Kitchen atorvastatin (LIPITOR) 40 MG tablet Take 1 tablet (40 mg total) by mouth daily.  . budesonide-formoterol (SYMBICORT) 160-4.5 MCG/ACT inhaler Inhale 2 puffs into the lungs 2 (two) times daily.  . Calcium-Magnesium-Vitamin D (CITRACAL CALCIUM+D) 600-40-500 MG-MG-UNIT TB24 Take 1 tablet by mouth daily.    . cetirizine (ZYRTEC) 10 MG tablet Take 10 mg by mouth daily.    . cyanocobalamin 2000 MCG tablet Take 2,500 mcg by mouth daily.   . febuxostat (ULORIC) 40 MG tablet Take 40 mg by mouth daily.  Marland Kitchen guaiFENesin  (MUCINEX) 600 MG 12 hr tablet Take 1,200 mg by mouth 2 (two) times daily.  . InFLIXimab (REMICADE IV) Inject into the vein every 6 (six) weeks.  . Ipratropium-Albuterol (COMBIVENT RESPIMAT) 20-100 MCG/ACT AERS respimat Inhale 1 puff into the lungs every 6 (six) hours.  Marland Kitchen ipratropium-albuterol (DUONEB) 0.5-2.5 (3) MG/3ML SOLN Take 3 mLs by nebulization every 4 (four) hours as needed.  . methotrexate (RHEUMATREX) 2.5 MG tablet Take 10 mg by mouth once a week. 4 tablets weekly  . Multiple Vitamin (MULTIVITAMIN) tablet Take 1 tablet by mouth daily.    . OXYGEN BIPAP with 1lpm o2  . pantoprazole (PROTONIX) 40 MG tablet Take 40 mg by mouth 2 (two) times daily as needed.   . predniSONE (DELTASONE) 10 MG tablet 4 x 2 days, 2 x 2 days, 1 x 2 days then stop-- takes as needed  . predniSONE (DELTASONE) 20 MG tablet 2 daily until better then 1 daily  . Respiratory Therapy Supplies (FLUTTER) DEVI Use as directed  .     . triamterene-hydrochlorothiazide (MAXZIDE-25) 37.5-25 MG per tablet Once daily  . [DISCONTINUED] predniSONE (DELTASONE) 20 MG tablet Take 20 mg by mouth 2 (two) times daily with a meal.  .                  Objective:   Physical Exam   Obese amb wm nad   05/31/2017        256 11/04/2016        264  09/22/2016        262   09/08/16 259 lb (117.5 kg)  07/14/16 257 lb (116.6 kg)  07/09/15 249 lb (112.9 kg)           HEENT: nl dentition, turbinates bilaterally, and oropharynx. Nl external ear canals without cough reflex   NECK :  without JVD/Nodes/TM/ nl carotid upstrokes bilaterally   LUNGS: no acc muscle use,  Nl contour chest with junky insp/exp rhonchi  bilaterally without cough on insp or exp maneuvers   CV:  RRR  no s3 or murmur or increase in P2, and  1+ LE sym pitting edema   ABD:  Tensely obese but  nontender with limited inspiratory excursion in the supine position. No bruits or organomegaly appreciated, bowel sounds nl  MS:  Nl gait/ ext warm without  deformities, calf tenderness, cyanosis or clubbing No obvious joint restrictions   SKIN: warm and dry without lesions    NEURO:  alert, approp, nl sensorium with  no motor or cerebellar deficits apparent.           I  personally reviewed images and agree with radiology impression as follows:   Chest CT 04/05/17 1. No evidence of interstitial lung disease. 2. Right middle lobe collapse, as before.        Assessment:

## 2017-05-31 NOTE — Telephone Encounter (Signed)
I have checked the PA folders and this forms is not located there. Attempted to call Karin Golden to get the PA information but they are not open yet. Will route to triage for further follow up.

## 2017-05-31 NOTE — Telephone Encounter (Signed)
PA sent to [employer] CMM Key: [facility] PA request has been sent to plan, and a determination is expected within 1-3 days.  IF not heard from in 1-3 days may call [phone]) Routing to [facility] for follow-up.

## 2017-05-31 NOTE — Patient Instructions (Signed)
Prednisone 10 mg take 2 until better then 1 daily   See calendar for specific medication instructions and bring it back for each and every office visit for every healthcare provider you see.  Without it,  you may not receive the best quality medical care that we feel you deserve.  You will note that the calendar groups together  your maintenance  medications that are timed at particular times of the day.  Think of this as your checklist for what your doctor has instructed you to do until your next evaluation to see what benefit  there is  to staying on a consistent group of medications intended to keep you well.  The other group at the bottom is entirely up to you to use as you see fit  for specific symptoms that may arise between visits that require you to treat them on an as needed basis.  Think of this as your action plan or "what if" list.   Separating the top medications from the bottom group is fundamental to providing you adequate care going forward.     Please schedule a follow up office visit in 4 weeks, sooner if needed to see Tammy NP on follow up

## 2017-06-01 DIAGNOSIS — R7303 Prediabetes: Secondary | ICD-10-CM | POA: Diagnosis not present

## 2017-06-01 DIAGNOSIS — J449 Chronic obstructive pulmonary disease, unspecified: Secondary | ICD-10-CM | POA: Diagnosis not present

## 2017-06-01 DIAGNOSIS — M069 Rheumatoid arthritis, unspecified: Secondary | ICD-10-CM | POA: Diagnosis not present

## 2017-06-01 DIAGNOSIS — G4733 Obstructive sleep apnea (adult) (pediatric): Secondary | ICD-10-CM | POA: Diagnosis not present

## 2017-06-01 NOTE — Telephone Encounter (Signed)
Give a sample then ov so can teach him incruse (unless he's already used it or anoro or breo) as the devices are not interchangable - elipta vs respimat)   rx is for one click each am if familiar with elipta

## 2017-06-01 NOTE — Telephone Encounter (Signed)
Received denial from ins for the spiriva  It says he has to try and fail incruse first Next ov 06/29/17

## 2017-06-01 NOTE — Telephone Encounter (Signed)
PA was denied for Spiriva, would you like to appeal?

## 2017-06-01 NOTE — Telephone Encounter (Signed)
ATC pt, no answer. Left message for pt to call back.  

## 2017-06-02 ENCOUNTER — Encounter: Payer: Self-pay | Admitting: Internal Medicine

## 2017-06-02 NOTE — Assessment & Plan Note (Signed)
Body mass index is 40.1 kg/m.  -  trending down/ encouraged  No results found for: TSH   Contributing to gerd risk/ doe/reviewed the need and the process to achieve and maintain neg calorie balance > defer f/u primary care including intermittently monitoring thyroid status

## 2017-06-02 NOTE — Assessment & Plan Note (Addendum)
Spirometry  10/10/13  FEV1 1.38 (48%)  Ratio 52  - 09/08/2016    try spriiva respimat   - used x 3 days only  - 09/22/2016    rechallenge with spiriva 2.5 x 2 daily  - 11/04/2016  After extensive coaching HFA effectiveness =    90% - PFT's  11/04/2016  FEV1 1.49 (53 % ) ratio 56  p 7 % improvement from saba p spiriva/symb160/combivent/duoneb w/in 2 h  prior to study with DLCO  64/65 % corrects to 87 % for alv volume - added prn prednisone as Plan D 11/04/2016 >>>    Not following as of 03/05/2017 - 03/05/2017  After extensive coaching inhaler device  effectiveness =    75% (short Ti)   -Echo 03/2017 >EF nml, Gr 1 DD  -CT chest 03/2017 >NO ILD ,Right middle lobe collapse, stable   05/31/2017  After extensive coaching inhaler device  effectiveness =    90% with mdi and smi   Pt is Group B in terms of symptom/risk and laba/lama therefore appropriate rx at this point and his hfa/smi techniques are adequate but still smoking and mild flare so rec pred 20 mg per day until better then taper to 10 mg daily       I had an extended discussion with the patient reviewing all relevant studies completed to date and  lasting 15 to 20 minutes of a 25 minute visit    The goal with a chronic steroid dependent illness is always arriving at the lowest effective dose that controls the disease/symptoms and not accepting a set "formula" which is based on statistics or guidelines that don't always take into account patient  variability or the natural hx of the dz in every individual patient, which may well vary over time.  For now therefore I recommend the patient maintain  Ceiling of 20 and floor of 10 mg daily for now   Each maintenance medication was reviewed in detail including most importantly the difference between maintenance and prns and under what circumstances the prns are to be triggered using an action plan format that is not reflected in the computer generated alphabetically organized AVS but trather by a customized  med calendar that reflects the AVS meds with confirmed 100% correlation.   In addition, Please see AVS for unique instructions that I personally wrote and verbalized to the the pt in detail and then reviewed with pt  by my nurse highlighting any  changes in therapy recommended at today's visit to their plan of care.

## 2017-06-02 NOTE — Assessment & Plan Note (Addendum)
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

## 2017-06-02 NOTE — Assessment & Plan Note (Signed)
>   3 min Discussed the risks and costs (both direct and indirect)  of smoking relative to the benefits of quitting but patient unwilling to commit at this point to a specific quit date.       

## 2017-06-02 NOTE — Telephone Encounter (Signed)
Patient returned, CB is [phone].

## 2017-06-03 ENCOUNTER — Telehealth: Payer: Self-pay | Admitting: Internal Medicine

## 2017-06-03 NOTE — Telephone Encounter (Signed)
   Chad Cowden, MD     5:06 PM  Note    Give a sample then ov so can teach him incruse (unless he's already used it or anoro or breo) as the devices are not interchangable - elipta vs respimat)   rx is for one click each am if familiar with elipta      Spoke with pt,, advised message. 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 FYI Dr. Sherene Sires.

## 2017-06-03 NOTE — Telephone Encounter (Signed)
Attempted to call patient, no answer, message left to call back.  

## 2017-06-04 LAB — ALPHA-1 ANTITRYPSIN PHENOTYPE: A-1 Antitrypsin, Ser: 178 mg/dL (ref 83–199)

## 2017-06-04 NOTE — Telephone Encounter (Signed)
Called spoke with patient He came to the office yesterday to pick up #1 sample  Pt does not want to change to another inhaler and reported that he is currently in the process with "a charitable organization" to get his Spiriva and Combivent Respimat free.  Pt stated that he will contact the office if this does not follow through so that he can discuss alternatives with MW.    Pt stated he will contact the office next week for an additional Spiriva sample and that should last him long enough to see if he can get his meds for free  Will sign and forward to MW as Lorain Childes

## 2017-06-10 ENCOUNTER — Telehealth (HOSPITAL_COMMUNITY): Payer: Self-pay

## 2017-06-10 NOTE — Telephone Encounter (Signed)
Encounter complete. 

## 2017-06-15 ENCOUNTER — Ambulatory Visit (HOSPITAL_COMMUNITY)
Admission: RE | Admit: 2017-06-15 | Payer: Medicare Other | Source: Ambulatory Visit | Attending: Cardiology | Admitting: Cardiology

## 2017-06-17 DIAGNOSIS — Z79899 Other long term (current) drug therapy: Secondary | ICD-10-CM | POA: Diagnosis not present

## 2017-06-17 DIAGNOSIS — M0589 Other rheumatoid arthritis with rheumatoid factor of multiple sites: Secondary | ICD-10-CM | POA: Diagnosis not present

## 2017-06-29 ENCOUNTER — Encounter: Payer: Self-pay | Admitting: Adult Health

## 2017-06-29 ENCOUNTER — Ambulatory Visit (INDEPENDENT_AMBULATORY_CARE_PROVIDER_SITE_OTHER): Payer: Medicare Other | Admitting: Adult Health

## 2017-06-29 DIAGNOSIS — J449 Chronic obstructive pulmonary disease, unspecified: Secondary | ICD-10-CM | POA: Diagnosis not present

## 2017-06-29 DIAGNOSIS — J9819 Other pulmonary collapse: Secondary | ICD-10-CM | POA: Diagnosis not present

## 2017-06-29 DIAGNOSIS — I2584 Coronary atherosclerosis due to calcified coronary lesion: Secondary | ICD-10-CM

## 2017-06-29 DIAGNOSIS — F17210 Nicotine dependence, cigarettes, uncomplicated: Secondary | ICD-10-CM

## 2017-06-29 DIAGNOSIS — I2510 Atherosclerotic heart disease of native coronary artery without angina pectoris: Secondary | ICD-10-CM

## 2017-06-29 NOTE — Assessment & Plan Note (Signed)
Smoking cessation discussed 

## 2017-06-29 NOTE — Patient Instructions (Addendum)
Continue on Symbicort and Spiriva .  Decrease Prednisone 30mg  daily for 1 week then 20mg  daily for 1 week then 10mg  daily and hold at this dose .  Work on not smoking  Mucinex DM Twice daily As needed  Cough /congestion  Follow up with Dr.  In 6-8 weeks and As needed   Please contact office for sooner follow up if symptoms do not improve or worsen or seek emergency care

## 2017-06-29 NOTE — Progress Notes (Signed)
@Patient  ID: , male    DOB: 1945/07/14, 72 y.o.   MRN: [id number]  Chief Complaint  Patient presents with  . Follow-up    COPD    Referring provider: [id number], MD  HPI: 72 yo male active smoker followed for GOLD III COPD and RML collapse  RA on MTX and Remicade  Childhood asthma  OSA on CPAP -followed by [facility]  Stage III CKD   TEST  PFT September 2018 showed moderate obstruction with an FEV1 of 53%, ratio 56, FVC 68%, no significant bronchodilator response, DLCO 64%.   Spirometry April 2019 showed similar obstruction with FEV1 at 51%, ratio 50, FVC 74%. 03/2017 Echo >EF 65 to 70% GR 1 DD .  High-resolution CT chest February 2019- for ILD, chronic right middle lobe collapse  .06/29/2017 Follow up : COPD GOLD III , smoker  Patient returns for a one-month follow-up.  Patient has underlying moderate to severe COPD and active smoker.  Last visit was continued to have persistent COPD symptoms with cough shortness of breath.  Patient was placed on chronic steroids.  Prednisone 40 mg.  Patient did not taper down as he says his breathing has stayed about the same.  He continues to smoke 1 pack/day.  Smoking cessation was discussed.  He remains on Symbicort and Spiriva.  Says that he gets short of breath with heavy activity.  Does have intermittent cough and wheezing.  He is not coughing up anything discolored currently.    He has underlying rheumatoid arthritis is on methotrexate and Remicade  Patient has diastolic heart failure.  is on Maxide for chronic edema. Does feel that his weight has been trending up since being on prednisone. 2D echo in February this year showed preserved EF with grade 1 diastolic dysfunction She has chronic kidney disease followed by renal.   Allergies  Allergen Reactions  . Chantix [Varenicline] Other (See Comments)    Suicidal thoughts    Immunization History  Administered Date(s) Administered  . Influenza Split 01/10/2011  .  Influenza-Unspecified 12/10/2013, 12/09/2016    Past Medical History:  Diagnosis Date  . AAA (abdominal aortic aneurysm) (HCC)   . Allergy    Rhinitis  . Arthritis    Rhuematoid  . Asthma   . Chronic renal insufficiency   . Diverticulosis   . Emphysema   . GERD (gastroesophageal reflux disease)   . Hearing loss of both ears   . Hiatal hernia   . Hyperlipidemia   . Hypertension   . Male hypogonadism   . Nephrolithiasis   . Nephrolithiasis   . Sleep apnea    On CPAP  . Stasis dermatitis   . Varicose veins   . Vitamin B 12 deficiency     Tobacco History: Social History   Tobacco Use  Smoking Status Current Every Day Smoker  . Packs/day: 1.50  . Years: 53.00  . Pack years: 79.50  . Types: E-cigarettes  Smokeless Tobacco Never Used  Tobacco Comment   1-1 1/2 pk per day   Ready to quit: No Counseling given: Yes Comment: 1-1 1/2 pk per day   Outpatient Encounter Medications as of 06/29/2017  Medication Sig  . albuterol (PROVENTIL HFA;VENTOLIN HFA) 108 (90 Base) MCG/ACT inhaler Inhale 2 puffs into the lungs every 4 (four) hours as needed for wheezing or shortness of breath.  . Ascorbic Acid (VITAMIN C) 1000 MG tablet Take 1,000 mg by mouth daily.  07/01/2017 aspirin EC 81 MG tablet Take 81 mg  by mouth daily.  Marland Kitchen atorvastatin (LIPITOR) 40 MG tablet Take 1 tablet (40 mg total) by mouth daily.  . budesonide-formoterol (SYMBICORT) 160-4.5 MCG/ACT inhaler Inhale 2 puffs into the lungs 2 (two) times daily.  . Calcium-Magnesium-Vitamin D (CITRACAL CALCIUM+D) 600-40-500 MG-MG-UNIT TB24 Take 1 tablet by mouth daily.    . cetirizine (ZYRTEC) 10 MG tablet Take 10 mg by mouth daily.    . cyanocobalamin 2000 MCG tablet Take 2,500 mcg by mouth daily.   . febuxostat (ULORIC) 40 MG tablet Take 40 mg by mouth daily.  Marland Kitchen guaiFENesin (MUCINEX) 600 MG 12 hr tablet Take 1,200 mg by mouth 2 (two) times daily.  . InFLIXimab (REMICADE IV) Inject into the vein every 6 (six) weeks.  .  Ipratropium-Albuterol (COMBIVENT RESPIMAT) 20-100 MCG/ACT AERS respimat Inhale 1 puff into the lungs every 6 (six) hours.  Marland Kitchen ipratropium-albuterol (DUONEB) 0.5-2.5 (3) MG/3ML SOLN Take 3 mLs by nebulization every 4 (four) hours as needed.  . methotrexate (RHEUMATREX) 2.5 MG tablet Take 10 mg by mouth once a week. 4 tablets weekly  . Multiple Vitamin (MULTIVITAMIN) tablet Take 1 tablet by mouth daily.    . OXYGEN BIPAP with 1lpm o2  . pantoprazole (PROTONIX) 40 MG tablet Take 40 mg by mouth 2 (two) times daily as needed.   . predniSONE (DELTASONE) 20 MG tablet 2 daily until better then 1 daily  . Respiratory Therapy Supplies (FLUTTER) DEVI Use as directed  . Tiotropium Bromide Monohydrate (SPIRIVA RESPIMAT) 2.5 MCG/ACT AERS Inhale 2 puffs into the lungs daily.  Marland Kitchen triamterene-hydrochlorothiazide (MAXZIDE-25) 37.5-25 MG per tablet Once daily  . [DISCONTINUED] predniSONE (DELTASONE) 10 MG tablet 4 x 2 days, 2 x 2 days, 1 x 2 days then stop-- takes as needed   No facility-administered encounter medications on file as of 06/29/2017.      Review of Systems  Constitutional:   No  weight loss, night sweats,  Fevers, chills,  +fatigue, or  lassitude.  HEENT:   No headaches,  Difficulty swallowing,  Tooth/dental problems, or  Sore throat,                No sneezing, itching, ear ache, nasal congestion, post nasal drip,   CV:  No chest pain,  Orthopnea, PND,   anasarca, dizziness, palpitations, syncope.   GI  No heartburn, indigestion, abdominal pain, nausea, vomiting, diarrhea, change in bowel habits, loss of appetite, bloody stools.   Resp:  .  No chest wall deformity  Skin: no rash or lesions.  GU: no dysuria, change in color of urine, no urgency or frequency.  No flank pain, no hematuria   MS:  No joint pain or swelling.  No decreased range of motion.  No back pain.    Physical Exam  BP 124/72 (BP Location: Left Arm, Cuff Size: Normal)   Pulse (!) 101   Ht 5\' 7"  (1.702 m)   Wt 267  lb (121.1 kg)   SpO2 95%   BMI 41.82 kg/m   GEN: A/Ox3; pleasant , NAD, elderly , in wc    HEENT:  /AT,  EACs-clear, TMs-wnl, NOSE-clear, THROAT-clear, no lesions, no postnasal drip or exudate noted.   NECK:  Supple w/ fair ROM; no JVD; normal carotid impulses w/o bruits; no thyromegaly or nodules palpated; no lymphadenopathy.    RESP decreased breath sounds in the bases  no accessory muscle use, no dullness to percussion  CARD:  RRR, no m/r/g, 1-2+  peripheral edema, pulses intact, no cyanosis or clubbing.  GI:  Soft & nt; nml bowel sounds; no organomegaly or masses detected.   Musco: Warm bil, no deformities or joint swelling noted.   Neuro: alert, no focal deficits noted.    Skin: Warm, no lesions or rashes    Lab Results:  CBC  BNP No results found for: BNP  ProBNP No results found for: PROBNP  Imaging: No results found.   Assessment & Plan:   COPD GOLD III  Difficult to control COPD and active heavy smoker.  Smoking cessation was discussed.  Continue on Symbicort and Spiriva.  Patient will taper down prednisone to hopefully 10 mg daily.  If no significant benefit continue to taper to completely off on return.  Plan  Patient Instructions  Continue on Symbicort and Spiriva .  Decrease Prednisone 30mg  daily for 1 week then 20mg  daily for 1 week then 10mg  daily and hold at this dose .  Work on not smoking  Mucinex DM Twice daily As needed  Cough /congestion  Follow up with Dr.  In 6-8 weeks and As needed   Please contact office for sooner follow up if symptoms do not improve or worsen or seek emergency care       Cigarette smoker Smoking cessation discussed  Right middle lobe syndrome Right middle lobe collapse/atelectasis questionable etiology. Has been stable on CT chest.  Will discuss with Dr. on return may need bronchoscopy as patient is a heavy smoker      , NP 06/29/2017

## 2017-06-29 NOTE — Assessment & Plan Note (Signed)
Right middle lobe collapse/atelectasis questionable etiology. Has been stable on CT chest.  Will discuss with Dr. Sherene Sires on return may need bronchoscopy as patient is a heavy smoker

## 2017-06-29 NOTE — Assessment & Plan Note (Signed)
Difficult to control COPD and active heavy smoker.  Smoking cessation was discussed.  Continue on Symbicort and Spiriva.  Patient will taper down prednisone to hopefully 10 mg daily.  If no significant benefit continue to taper to completely off on return.  Plan  Patient Instructions  Continue on Symbicort and Spiriva .  Decrease Prednisone 30mg  daily for 1 week then 20mg  daily for 1 week then 10mg  daily and hold at this dose .  Work on not smoking  Mucinex DM Twice daily As needed  Cough /congestion  Follow up with Dr.  In 6-8 weeks and As needed   Please contact office for sooner follow up if symptoms do not improve or worsen or seek emergency care

## 2017-06-30 NOTE — Progress Notes (Signed)
Chart and office note reviewed in detail  > agree with a/p as outlined    

## 2017-07-06 ENCOUNTER — Other Ambulatory Visit: Payer: Self-pay | Admitting: Acute Care

## 2017-07-06 DIAGNOSIS — Z122 Encounter for screening for malignant neoplasm of respiratory organs: Secondary | ICD-10-CM

## 2017-07-06 DIAGNOSIS — F17210 Nicotine dependence, cigarettes, uncomplicated: Secondary | ICD-10-CM

## 2017-07-08 ENCOUNTER — Telehealth: Payer: Self-pay | Admitting: Adult Health

## 2017-07-08 NOTE — Telephone Encounter (Signed)
Pt is calling back [phone]  Please leave a detailed message

## 2017-07-08 NOTE — Telephone Encounter (Signed)
Called and spoke with pt who stated he saw TP 5/21 and she stated to him that she was going to send a Rx of pred to his pharmacy.  I stated to pt, MW sent a script of pred 20mg  #100 to his pharmacy 4/22 with 1 additional refill.  I stated to pt to call his pharmacy as he should have 1 refill remaining from the previous script MW sent to the pharmacy.  Pt expressed understanding. Nothing further needed at this time.

## 2017-07-08 NOTE — Telephone Encounter (Signed)
lmtcb for pt.  Pt was sent pred 20mg  #100 with 1 refill on 4.22.2019, pt should have enough for the recs made by TP at last OV.

## 2017-07-13 ENCOUNTER — Telehealth (HOSPITAL_COMMUNITY): Payer: Self-pay

## 2017-07-13 ENCOUNTER — Telehealth (HOSPITAL_COMMUNITY): Payer: Self-pay | Admitting: *Deleted

## 2017-07-13 NOTE — Telephone Encounter (Signed)
Patient given detailed instructions per Myocardial Perfusion Study Information Sheet for the test on 07/15/2017 at 1230. Patient notified to arrive 15 minutes early and that it is imperative to arrive on time for appointment to keep from having the test rescheduled.  If you need to cancel or reschedule your appointment, please call the office within 24 hours of your appointment. . Patient verbalized understanding.EK

## 2017-07-13 NOTE — Telephone Encounter (Signed)
Left message on voicemail in reference to upcoming appointment scheduled for 07/15/17. Phone number given for a call back so details instructions can be given. [name]   

## 2017-07-14 ENCOUNTER — Telehealth (HOSPITAL_COMMUNITY): Payer: Self-pay

## 2017-07-14 NOTE — Telephone Encounter (Signed)
Encounter complete. 

## 2017-07-15 ENCOUNTER — Ambulatory Visit (HOSPITAL_COMMUNITY)
Admission: RE | Admit: 2017-07-15 | Discharge: 2017-07-15 | Disposition: A | Discharge status: Home / Self Care | Payer: Medicare Other | Source: Ambulatory Visit | Attending: Cardiology | Admitting: Cardiology

## 2017-07-15 DIAGNOSIS — I2510 Atherosclerotic heart disease of native coronary artery without angina pectoris: Secondary | ICD-10-CM | POA: Insufficient documentation

## 2017-07-15 DIAGNOSIS — I2584 Coronary atherosclerosis due to calcified coronary lesion: Secondary | ICD-10-CM | POA: Insufficient documentation

## 2017-07-15 LAB — MYOCARDIAL PERFUSION IMAGING
LV dias vol: 84 mL (ref 62–150)
LV sys vol: 20 mL
Peak HR: 111 {beats}/min
Rest HR: 100 {beats}/min
SDS: 6
SRS: 0
SSS: 6
TID: 0.86

## 2017-07-15 MED ORDER — REGADENOSON 0.4 MG/5ML IV SOLN
0.4000 mg | Freq: Once | INTRAVENOUS | Status: AC
  Administered 2017-07-15: 0.4 mg via INTRAVENOUS

## 2017-07-15 MED ORDER — TECHNETIUM TC 99M TETROFOSMIN IV KIT
9.9000 | PACK | Freq: Once | INTRAVENOUS | Status: AC | PRN
  Administered 2017-07-15: 9.9 via INTRAVENOUS
  Filled 2017-07-15: qty 10

## 2017-07-15 MED ORDER — TECHNETIUM TC 99M TETROFOSMIN IV KIT
31.4000 | PACK | Freq: Once | INTRAVENOUS | Status: AC | PRN
  Administered 2017-07-15: 31.4 via INTRAVENOUS
  Filled 2017-07-15: qty 32

## 2017-07-15 MED ORDER — AMINOPHYLLINE 25 MG/ML IV SOLN
75.0000 mg | Freq: Once | INTRAVENOUS | Status: AC
  Administered 2017-07-15: 75 mg via INTRAVENOUS

## 2017-07-19 ENCOUNTER — Other Ambulatory Visit: Payer: Self-pay

## 2017-07-19 ENCOUNTER — Encounter (HOSPITAL_COMMUNITY): Payer: Self-pay

## 2017-07-19 ENCOUNTER — Inpatient Hospital Stay (HOSPITAL_COMMUNITY)
Admission: EM | Admit: 2017-07-19 | Discharge: 2017-07-23 | DRG: 175 | Disposition: A | Discharge status: Home Health | Payer: Medicare Other | Attending: Family Medicine | Admitting: Family Medicine

## 2017-07-19 ENCOUNTER — Emergency Department (HOSPITAL_COMMUNITY): Payer: Medicare Other

## 2017-07-19 ENCOUNTER — Other Ambulatory Visit (HOSPITAL_COMMUNITY): Payer: Medicare Other

## 2017-07-19 DIAGNOSIS — Z79899 Other long term (current) drug therapy: Secondary | ICD-10-CM | POA: Diagnosis not present

## 2017-07-19 DIAGNOSIS — E876 Hypokalemia: Secondary | ICD-10-CM | POA: Diagnosis not present

## 2017-07-19 DIAGNOSIS — E785 Hyperlipidemia, unspecified: Secondary | ICD-10-CM | POA: Diagnosis present

## 2017-07-19 DIAGNOSIS — H9190 Unspecified hearing loss, unspecified ear: Secondary | ICD-10-CM | POA: Diagnosis present

## 2017-07-19 DIAGNOSIS — R600 Localized edema: Secondary | ICD-10-CM | POA: Diagnosis not present

## 2017-07-19 DIAGNOSIS — J441 Chronic obstructive pulmonary disease with (acute) exacerbation: Secondary | ICD-10-CM | POA: Diagnosis present

## 2017-07-19 DIAGNOSIS — I82433 Acute embolism and thrombosis of popliteal vein, bilateral: Secondary | ICD-10-CM | POA: Diagnosis present

## 2017-07-19 DIAGNOSIS — M7989 Other specified soft tissue disorders: Secondary | ICD-10-CM | POA: Diagnosis not present

## 2017-07-19 DIAGNOSIS — Z96642 Presence of left artificial hip joint: Secondary | ICD-10-CM | POA: Diagnosis present

## 2017-07-19 DIAGNOSIS — R05 Cough: Secondary | ICD-10-CM | POA: Diagnosis not present

## 2017-07-19 DIAGNOSIS — Z96612 Presence of left artificial shoulder joint: Secondary | ICD-10-CM | POA: Diagnosis present

## 2017-07-19 DIAGNOSIS — N179 Acute kidney failure, unspecified: Secondary | ICD-10-CM | POA: Diagnosis present

## 2017-07-19 DIAGNOSIS — N183 Chronic kidney disease, stage 3 (moderate): Secondary | ICD-10-CM | POA: Diagnosis present

## 2017-07-19 DIAGNOSIS — G4733 Obstructive sleep apnea (adult) (pediatric): Secondary | ICD-10-CM | POA: Diagnosis present

## 2017-07-19 DIAGNOSIS — R739 Hyperglycemia, unspecified: Secondary | ICD-10-CM

## 2017-07-19 DIAGNOSIS — F17210 Nicotine dependence, cigarettes, uncomplicated: Secondary | ICD-10-CM | POA: Diagnosis not present

## 2017-07-19 DIAGNOSIS — Z7982 Long term (current) use of aspirin: Secondary | ICD-10-CM

## 2017-07-19 DIAGNOSIS — Z6841 Body Mass Index (BMI) 40.0 and over, adult: Secondary | ICD-10-CM

## 2017-07-19 DIAGNOSIS — J9601 Acute respiratory failure with hypoxia: Secondary | ICD-10-CM | POA: Diagnosis present

## 2017-07-19 DIAGNOSIS — I82403 Acute embolism and thrombosis of unspecified deep veins of lower extremity, bilateral: Secondary | ICD-10-CM

## 2017-07-19 DIAGNOSIS — I714 Abdominal aortic aneurysm, without rupture: Secondary | ICD-10-CM | POA: Diagnosis present

## 2017-07-19 DIAGNOSIS — I5033 Acute on chronic diastolic (congestive) heart failure: Secondary | ICD-10-CM | POA: Diagnosis present

## 2017-07-19 DIAGNOSIS — I2699 Other pulmonary embolism without acute cor pulmonale: Principal | ICD-10-CM | POA: Diagnosis present

## 2017-07-19 DIAGNOSIS — J449 Chronic obstructive pulmonary disease, unspecified: Secondary | ICD-10-CM | POA: Diagnosis not present

## 2017-07-19 DIAGNOSIS — Z86711 Personal history of pulmonary embolism: Secondary | ICD-10-CM

## 2017-07-19 DIAGNOSIS — R0600 Dyspnea, unspecified: Secondary | ICD-10-CM | POA: Diagnosis not present

## 2017-07-19 DIAGNOSIS — E1122 Type 2 diabetes mellitus with diabetic chronic kidney disease: Secondary | ICD-10-CM | POA: Diagnosis present

## 2017-07-19 DIAGNOSIS — E872 Acidosis: Secondary | ICD-10-CM | POA: Diagnosis present

## 2017-07-19 DIAGNOSIS — I824Y3 Acute embolism and thrombosis of unspecified deep veins of proximal lower extremity, bilateral: Secondary | ICD-10-CM | POA: Diagnosis not present

## 2017-07-19 DIAGNOSIS — R0602 Shortness of breath: Secondary | ICD-10-CM | POA: Diagnosis not present

## 2017-07-19 DIAGNOSIS — Z7952 Long term (current) use of systemic steroids: Secondary | ICD-10-CM

## 2017-07-19 DIAGNOSIS — E1165 Type 2 diabetes mellitus with hyperglycemia: Secondary | ICD-10-CM | POA: Diagnosis present

## 2017-07-19 DIAGNOSIS — F17290 Nicotine dependence, other tobacco product, uncomplicated: Secondary | ICD-10-CM | POA: Diagnosis present

## 2017-07-19 DIAGNOSIS — Z7951 Long term (current) use of inhaled steroids: Secondary | ICD-10-CM

## 2017-07-19 DIAGNOSIS — I5031 Acute diastolic (congestive) heart failure: Secondary | ICD-10-CM | POA: Diagnosis not present

## 2017-07-19 DIAGNOSIS — I130 Hypertensive heart and chronic kidney disease with heart failure and stage 1 through stage 4 chronic kidney disease, or unspecified chronic kidney disease: Secondary | ICD-10-CM | POA: Diagnosis present

## 2017-07-19 DIAGNOSIS — I872 Venous insufficiency (chronic) (peripheral): Secondary | ICD-10-CM | POA: Diagnosis present

## 2017-07-19 DIAGNOSIS — I878 Other specified disorders of veins: Secondary | ICD-10-CM | POA: Diagnosis present

## 2017-07-19 DIAGNOSIS — K140 Glossitis: Secondary | ICD-10-CM | POA: Diagnosis present

## 2017-07-19 DIAGNOSIS — R310 Gross hematuria: Secondary | ICD-10-CM | POA: Diagnosis present

## 2017-07-19 DIAGNOSIS — K219 Gastro-esophageal reflux disease without esophagitis: Secondary | ICD-10-CM | POA: Diagnosis present

## 2017-07-19 LAB — CBC WITH DIFFERENTIAL/PLATELET
Basophils Absolute: 0 10*3/uL (ref 0.0–0.1)
Basophils Relative: 0 %
Eosinophils Absolute: 0 10*3/uL (ref 0.0–0.7)
Eosinophils Relative: 0 %
HCT: 40.8 % (ref 39.0–52.0)
Hemoglobin: 13.4 g/dL (ref 13.0–17.0)
Lymphocytes Relative: 13 %
Lymphs Abs: 1.6 10*3/uL (ref 0.7–4.0)
MCH: 33.8 pg (ref 26.0–34.0)
MCHC: 32.8 g/dL (ref 30.0–36.0)
MCV: 102.8 fL — ABNORMAL HIGH (ref 78.0–100.0)
Monocytes Absolute: 0.9 10*3/uL (ref 0.1–1.0)
Monocytes Relative: 8 %
Neutro Abs: 9.2 10*3/uL — ABNORMAL HIGH (ref 1.7–7.7)
Neutrophils Relative %: 79 %
Platelets: 122 10*3/uL — ABNORMAL LOW (ref 150–400)
RBC: 3.97 MIL/uL — ABNORMAL LOW (ref 4.22–5.81)
RDW: 17.5 % — ABNORMAL HIGH (ref 11.5–15.5)
WBC: 11.7 10*3/uL — ABNORMAL HIGH (ref 4.0–10.5)

## 2017-07-19 LAB — URINALYSIS, ROUTINE W REFLEX MICROSCOPIC
Bacteria, UA: NONE SEEN
Bilirubin Urine: NEGATIVE
Glucose, UA: NEGATIVE mg/dL
Ketones, ur: NEGATIVE mg/dL
Leukocytes, UA: NEGATIVE
Nitrite: NEGATIVE
Protein, ur: NEGATIVE mg/dL
RBC / HPF: >50 RBC/hpf — ABNORMAL HIGH (ref 0–5)
Specific Gravity, Urine: 1.017 (ref 1.005–1.030)
pH: 5 (ref 5.0–8.0)

## 2017-07-19 LAB — COMPREHENSIVE METABOLIC PANEL
ALT: 44 U/L (ref 17–63)
AST: 32 U/L (ref 15–41)
Albumin: 3.3 g/dL — ABNORMAL LOW (ref 3.5–5.0)
Alkaline Phosphatase: 49 U/L (ref 38–126)
Anion gap: 12 (ref 5–15)
BUN: 34 mg/dL — ABNORMAL HIGH (ref 6–20)
CO2: 27 mmol/L (ref 22–32)
Calcium: 9.5 mg/dL (ref 8.9–10.3)
Chloride: 103 mmol/L (ref 101–111)
Creatinine, Ser: 1.28 mg/dL — ABNORMAL HIGH (ref 0.61–1.24)
GFR calc Af Amer: >60 mL/min (ref >60)
GFR calc non Af Amer: 54 mL/min — ABNORMAL LOW (ref >60)
Glucose, Bld: 139 mg/dL — ABNORMAL HIGH (ref 65–99)
Potassium: 4.1 mmol/L (ref 3.5–5.1)
Sodium: 142 mmol/L (ref 135–145)
Total Bilirubin: 0.7 mg/dL (ref 0.3–1.2)
Total Protein: 6.4 g/dL — ABNORMAL LOW (ref 6.5–8.1)

## 2017-07-19 LAB — I-STAT CG4 LACTIC ACID, ED
Lactic Acid, Venous: 2.73 mmol/L (ref 0.5–1.9)
Lactic Acid, Venous: 2.28 mmol/L (ref 0.5–1.9)

## 2017-07-19 LAB — I-STAT TROPONIN, ED: Troponin i, poc: 0.01 ng/mL (ref 0.00–0.08)

## 2017-07-19 LAB — BRAIN NATRIURETIC PEPTIDE: B Natriuretic Peptide: 45.4 pg/mL (ref 0.0–100.0)

## 2017-07-19 MED ORDER — AZITHROMYCIN 250 MG PO TABS
500.0000 mg | ORAL_TABLET | Freq: Once | ORAL | Status: AC
  Administered 2017-07-19: 500 mg via ORAL
  Filled 2017-07-19: qty 2

## 2017-07-19 MED ORDER — SODIUM CHLORIDE 0.9 % IV BOLUS
500.0000 mL | Freq: Once | INTRAVENOUS | Status: AC
  Administered 2017-07-19: 500 mL via INTRAVENOUS

## 2017-07-19 MED ORDER — ALBUTEROL SULFATE (2.5 MG/3ML) 0.083% IN NEBU
5.0000 mg | INHALATION_SOLUTION | Freq: Once | RESPIRATORY_TRACT | Status: DC
  Filled 2017-07-19: qty 6

## 2017-07-19 MED ORDER — IPRATROPIUM-ALBUTEROL 0.5-2.5 (3) MG/3ML IN SOLN
3.0000 mL | Freq: Once | RESPIRATORY_TRACT | Status: DC
  Filled 2017-07-19: qty 3

## 2017-07-19 MED ORDER — ALBUTEROL (5 MG/ML) CONTINUOUS INHALATION SOLN
10.0000 mg/h | INHALATION_SOLUTION | RESPIRATORY_TRACT | Status: DC
  Administered 2017-07-19: 10 mg/h via RESPIRATORY_TRACT
  Filled 2017-07-19: qty 20

## 2017-07-19 MED ORDER — SODIUM CHLORIDE 0.9 % IV SOLN
1.0000 g | Freq: Once | INTRAVENOUS | Status: AC
  Administered 2017-07-19: 1 g via INTRAVENOUS
  Filled 2017-07-19: qty 10

## 2017-07-19 MED ORDER — METHYLPREDNISOLONE SODIUM SUCC 125 MG IJ SOLR
125.0000 mg | Freq: Once | INTRAMUSCULAR | Status: AC
  Administered 2017-07-19: 125 mg via INTRAVENOUS
  Filled 2017-07-19: qty 2

## 2017-07-19 MED ORDER — ALBUTEROL (5 MG/ML) CONTINUOUS INHALATION SOLN
INHALATION_SOLUTION | RESPIRATORY_TRACT | Status: AC
  Filled 2017-07-19: qty 20

## 2017-07-19 NOTE — ED Notes (Signed)
Pt advised per MD order can receive O2 upon request. Per pt "yes please, it may help me breathe a little better". Pt provided O2 cannula, 2L O2 continuous. [employer]

## 2017-07-19 NOTE — ED Notes (Signed)
I gave I Stat CG4 result to MD [place]

## 2017-07-19 NOTE — ED Notes (Signed)
Pt assisted to and from bathroom closest to assigned rm (ED20). Pt had difficulty catching breath. O2 stats between 94-96% RA, pulse between 123-131bpm. Pt sat on side of bed 2-84min approx before able to put feet/rest in comfortable position. Pt positioned in bed 85degree angle approx to assist w/breathing. [employer]

## 2017-07-19 NOTE — ED Triage Notes (Signed)
Patient c/o increased SOB and leg swelling. Patient states the left leg has been draining clear fluids since around noon today.

## 2017-07-19 NOTE — ED Provider Notes (Signed)
[facility]-EMERGENCY DEPT Provider Note   CSN: [id number] Arrival date & time: 07/19/17  1540     History   Chief Complaint Chief Complaint  Patient presents with  . Shortness of Breath  . Leg Swelling    HPI Chad Duke is a 72 y.o. male.  HPI   Patient is a 72 year old male with past medical history significant for AAA being monitored, hypertension hyperlipidemia, chronic smoker, on chronic steroids.  Patient had recent nuclear stress test showing a normal EF of 75% with normal nuclear stress test.  He is presenting today with bilateral leg weeping.  Patient reports that he has had swollen legs for last 5 years.  But that recently he bumped into something is had a little bit of bleeding from both legs.  Patient reports he is still smoking.  His last creatinine was 1.5.  He he reports that he has a follow-up appoint with urology next week.  He reports that this is for his "kidney problems".  Past Medical History:  Diagnosis Date  . AAA (abdominal aortic aneurysm) (HCC)   . Allergy    Rhinitis  . Arthritis    Rhuematoid  . Asthma   . Chronic renal insufficiency   . Diverticulosis   . Emphysema   . GERD (gastroesophageal reflux disease)   . Hearing loss of both ears   . Hiatal hernia   . Hyperlipidemia   . Hypertension   . Male hypogonadism   . Nephrolithiasis   . Nephrolithiasis   . Sleep apnea    On CPAP  . Stasis dermatitis   . Varicose veins   . Vitamin B 12 deficiency     Patient Active Problem List   Diagnosis Date Noted  . 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 Cigarette smoker 09/08/2016  . Morbid obesity due to excess calories (HCC) 09/08/2016  . Pain in joint, ankle and foot 03/31/2012  . Abnormality of gait 03/31/2012  . Rheumatoid arthritis(714.0) 03/31/2012  . COPD GOLD III  02/12/2011  . Sleep apnea 02/12/2011    Past Surgical History:  Procedure Laterality Date  . CARPAL TUNNEL RELEASE     bilateral  . FRACTURE  SURGERY Left    Fibula  . JOINT REPLACEMENT Left    Shoulder  . JOINT REPLACEMENT Left    Hip   . KIDNEY STONE SURGERY  2003  . left hip replacement    . left knee    . left shoulder    . LITHOTRIPSY          Home Medications    Prior to Admission medications   Medication Sig Start Date End Date Taking? Authorizing Provider  albuterol (PROVENTIL HFA;VENTOLIN HFA) 108 (90 Base) MCG/ACT inhaler Inhale 2 puffs into the lungs every 4 (four) hours as needed for wheezing or shortness of breath. 11/04/16  Yes Nyoka Cowden, MD  Ascorbic Acid (VITAMIN C) 1000 MG tablet Take 1,000 mg by mouth daily.   Yes [provider]  aspirin EC 81 MG tablet Take 81 mg by mouth daily.   Yes [provider]  atorvastatin (LIPITOR) 40 MG tablet Take 1 tablet (40 mg total) by mouth daily. 05/18/17 08/16/17 Yes Lewayne Bunting, MD  budesonide-formoterol [facility]) 160-4.5 MCG/ACT inhaler Inhale 2 puffs into the lungs 2 (two) times daily. 05/26/17  Yes Nyoka Cowden, MD  Calcium-Magnesium-Vitamin D (CITRACAL CALCIUM+D) 600-40-500 MG-MG-UNIT TB24 Take 1 tablet by mouth daily.     Yes [provider]  cetirizine (ZYRTEC) 10 MG tablet Take 10 mg by mouth daily.     Yes [provider]  cyanocobalamin 2000 MCG tablet Take 2,500 mcg by mouth daily.    Yes [provider]  febuxostat (ULORIC) 40 MG tablet Take 40 mg by mouth daily.   Yes [provider]  guaiFENesin (MUCINEX) 600 MG 12 hr tablet Take 1,200 mg by mouth 2 (two) times daily.   Yes [provider]  Ipratropium-Albuterol (COMBIVENT RESPIMAT) 20-100 MCG/ACT AERS respimat Inhale 1 puff into the lungs every 6 (six) hours.   Yes [provider]  ipratropium-albuterol (DUONEB) 0.5-2.5 (3) MG/3ML SOLN Take 3 mLs by nebulization every 6 (six) hours.    Yes [provider]  methotrexate (RHEUMATREX) 2.5 MG tablet Take 10 mg by mouth once a week. 4 tablets weekly (Mon) 02/11/11  Yes  [provider]  Multiple Vitamin (MULTIVITAMIN) tablet Take 1 tablet by mouth daily.     Yes [provider]  pantoprazole (PROTONIX) 40 MG tablet Take 40 mg by mouth 2 (two) times daily.    Yes [provider]  predniSONE (DELTASONE) 20 MG tablet 2 daily until better then 1 daily Patient taking differently: Take 10-20 mg by mouth 2 (two) times daily with a meal. Take 2 tablets (20 mg) in the morning and Take 1 tablet (10 mg) at bedtime 05/31/17  Yes Nyoka Cowden, MD  Tiotropium Bromide Monohydrate (SPIRIVA RESPIMAT) 2.5 MCG/ACT AERS Inhale 2 puffs into the lungs daily. 05/31/17  Yes Nyoka Cowden, MD  triamterene-hydrochlorothiazide (MAXZIDE-25) 37.5-25 MG per tablet Take 1 tablet by mouth daily. Once daily 01/13/11  Yes [provider]  InFLIXimab (REMICADE IV) Inject into the vein every 6 (six) weeks.    [provider]  OXYGEN BIPAP with 1lpm o2    [provider]  Respiratory Therapy Supplies (FLUTTER) DEVI Use as directed 11/04/16   Nyoka Cowden, MD    Family History Family History  Problem Relation Age of Onset  . Emphysema Father   . Asthma Father   . Osteoarthritis Father   . Heart attack Mother   . Uterine cancer Mother   . Heart disease Mother   . ADD / ADHD Son   . Depression Son   . Hypertension Brother   . Heart disease Brother        Pacemaker   . Heart murmur Brother     Social History Social History   Tobacco Use  . Smoking status: Current Every Day Smoker    Packs/day: 1.50    Years: 53.00    Pack years: 79.50    Types: E-cigarettes  . Smokeless tobacco: Never Used  . Tobacco comment: 1-1 1/2 pk per day  Substance Use Topics  . Alcohol use: Yes    Alcohol/week: 0.0 oz    Comment: Occasional  . Drug use: No     Allergies   Chantix [varenicline]   Review of Systems Review of Systems  Constitutional: Negative for activity change.  Respiratory: Positive for cough and shortness of breath.  Negative for choking.   Cardiovascular: Negative for chest pain.  Gastrointestinal: Negative for abdominal pain.  Skin: Positive for rash.  All other systems reviewed and are negative.    Physical Exam Updated Vital Signs BP (!) 129/94 (BP Location: Left Arm)   Pulse (!) 116   Temp 98.4 F (36.9 C) (Oral)   Resp 18   Ht 5\' 7"  (1.702 m)   Wt 117.5  kg (259 lb)   SpO2 95%   BMI 40.57 kg/m   Physical Exam  Constitutional: He is oriented to person, place, and time. He appears well-nourished.  HENT:  Head: Normocephalic.  Eyes: Conjunctivae are normal.  Cardiovascular:  tachycardia  Pulmonary/Chest: Accessory muscle usage present. Tachypnea noted. He is in respiratory distress. He has decreased breath sounds. He has wheezes.  Musculoskeletal:       Right lower leg: He exhibits edema.       Left lower leg: He exhibits edema.  4+ pitting edema on the right, 2+ on the left.  2 open wounds superficial, with, weeping.  Neurological: He is oriented to person, place, and time.  Skin: Skin is warm and dry. He is not diaphoretic.  Psychiatric: He has a normal mood and affect. His behavior is normal.     ED Treatments / Results  Labs (all labs ordered are listed, but only abnormal results are displayed) Labs Reviewed  COMPREHENSIVE METABOLIC PANEL  CBC WITH DIFFERENTIAL/PLATELET  I-STAT TROPONIN, ED  I-STAT CG4 LACTIC ACID, ED    EKG EKG Interpretation  Date/Time:  Monday July 19 2017 15:54:42 EDT Ventricular Rate:  122 PR Interval:    QRS Duration: 139 QT Interval:  333 QTC Calculation: 477 R Axis:   -124 Text Interpretation:  Sinus tachycardia IVCD, consider atypical RBBB Inferior infarct, old Lateral leads are also involved No significant change since last tracing Confirmed by Walker, Mitzi Hansen ([id number]) on 07/19/2017 4:45:05 PM   Radiology Dg Chest 2 View  Result Date: 07/19/2017 CLINICAL DATA:  Shortness of breath EXAM: CHEST - 2 VIEW COMPARISON:  03/04/2017 chest  radiograph 04/05/2017 chest CT FINDINGS: The heart size and mediastinal contours are within normal limits. Both lungs are clear. The visualized skeletal structures are unremarkable. IMPRESSION: No active cardiopulmonary disease. Electronically Signed   By: Deatra Robinson M.D.   On: 07/19/2017 17:44    Procedures Procedures (including critical care time)  CRITICAL CARE Performed by: Arlana Hove Total critical care time: 45 minutes Critical care time was exclusive of separately billable procedures and treating other patients. Critical care was necessary to treat or prevent imminent or life-threatening deterioration. Critical care was time spent personally by me on the following activities: development of treatment plan with patient and/or surrogate as well as nursing, discussions with consultants, evaluation of patient's response to treatment, examination of patient, obtaining history from patient or surrogate, ordering and performing treatments and interventions, ordering and review of laboratory studies, ordering and review of radiographic studies, pulse oximetry and re-evaluation of patient's condition.   Medications Ordered in ED Medications  ipratropium-albuterol (DUONEB) 0.5-2.5 (3) MG/3ML nebulizer solution 3 mL (has no administration in time range)     Initial Impression / Assessment and Plan / ED Course  I have reviewed the triage vital signs and the nursing notes.  Pertinent labs & imaging results that were available during my care of the patient were reviewed by me and considered in my medical decision making (see chart for details).     Patient is a 72 year old male with past medical history significant for AAA being monitored, hypertension hyperlipidemia, chronic smoker, on chronic steroids.  Patient had recent nuclear stress test showing a normal EF of 75% with normal nuclear stress test.  He is presenting today with bilateral leg weeping.  Patient reports that he has  had swollen legs for last 5 years.  But that recently he bumped into something is had a little bit of bleeding from  both legs.  Patient reports he is still smoking.  His last creatinine was 1.5.  He he reports that he has a follow-up appoint with urology next week.  He reports that this is for his "kidney problems".  6:25 PM Started to address patient's issues one by one.  However he reports he does not want any respiratory treatment because last time it "cost much".  Discussed increasing his prednisone but he is also reluctant to do that.  I discussed that he would like some labs, patient will allow me to get labs today.  Patient wants me to "drain his legs".  I discussed how this was a longer-term issue that he might need diuretics.  However patient EF is 75%.  It could be due to low oncotic pressure.  Patient superficial abrasions cannot be sewn/repaired as he was hoping.  The skin is not  friable and it is just a superficial tearing. 8:57 PM Patient refused albuterol initially.  We had a long discussion about it and I ordered a DuoNeb.  Then he decided to refuse that.  Patient having increasingly trouble breathing.  Patient tachypneic to the 30s, coarse breath sounds, coughing.  Patient ablated to the bathroom with extreme tachypnea.  On arrival back to the room I again talked patient into allowing me to do some therapy.  Will give methylprednisolone IV, started continuous albuterol treatment and give fluids.  Patient will likely require admission given his level of tachypnea, coarse breath sounds.  Final Clinical Impressions(s) / ED Diagnoses   Final diagnoses:  None    ED Discharge Orders    None       Abelino Derrick, MD 07/19/17 2318

## 2017-07-19 NOTE — H&P (Signed)
History and Physical   [facility] [place] [facility] [place] [facility] Admission History and Physical [employer], D.O.    Patient Name: Chad Duke MR#: [id number] Date of Birth: 28-Nov-1945 Date of Admission: 07/19/2017  Referring MD/NP/PA: Dr. Corlis Leak Primary Care Physician: [name], MD  Chief Complaint:  Chief Complaint  Patient presents with  . Shortness of Breath  . Leg Swelling    HPI: Chad Duke is a 72 y.o. male with a known history of hypertension, hyperlipidemia, GERD, CKD, COPD, tobacco dependence presents to the emergency department for evaluation of shortness of breath and lower extremity edem for the past several days. Legs have become increasingly swollen.   Also complains of a sore on the right side of his tongue.   Patient denies fevers/chills, weakness, dizziness, chest pain, N/V/C/D, abdominal pain, dysuria/frequency, changes in mental status.    Of note patient had a nuclear stress test recently which revealed an ejection fraction of 75%.  Otherwise there has been no change in status. Patient has been taking medication as prescribed and there has been no recent change in medication or diet.  No recent antibiotics.  There has been no recent illness, hospitalizations, travel or sick contacts.    EMS/ED Course: Patient received Rocephin, azithromycin , Solu-Medrol, normal saline.. Medical admission has been requested for further management of acute exacerbation of COPD, community-acquired pneumonia.  Review of Systems:  CONSTITUTIONAL: No fever/chills, fatigue, weakness, weight gain/loss, headache. EYES: No blurry or double vision. ENT: No tinnitus, postnasal drip, redness or soreness of the oropharynx. RESPIRATORY: Positive cough, dyspnea, wheeze.  No hemoptysis.  CARDIOVASCULAR: No chest pain, palpitations, syncope, orthopnea.  Positive lower extremity edema.  GASTROINTESTINAL: No nausea, vomiting, abdominal pain, diarrhea, constipation.  No  hematemesis, melena or hematochezia. GENITOURINARY: No dysuria, frequency, hematuria. ENDOCRINE: No polyuria or nocturia. No heat or cold intolerance. HEMATOLOGY: No anemia, bruising, bleeding. INTEGUMENTARY: No rashes, ulcers, lesions. MUSCULOSKELETAL: No arthritis, gout. NEUROLOGIC: No numbness, tingling, ataxia, seizure-type activity, weakness. PSYCHIATRIC: No anxiety, depression, insomnia.   Past Medical History:  Diagnosis Date  . AAA (abdominal aortic aneurysm) (HCC)   . Allergy    Rhinitis  . Arthritis    Rhuematoid  . Asthma   . Chronic renal insufficiency   . Diverticulosis   . Emphysema   . GERD (gastroesophageal reflux disease)   . Hearing loss of both ears   . Hiatal hernia   . Hyperlipidemia   . Hypertension   . Male hypogonadism   . Nephrolithiasis   . Nephrolithiasis   . Sleep apnea    On CPAP  . Stasis dermatitis   . Varicose veins   . Vitamin B 12 deficiency     Past Surgical History:  Procedure Laterality Date  . CARPAL TUNNEL RELEASE     bilateral  . FRACTURE SURGERY Left    Fibula  . JOINT REPLACEMENT Left    Shoulder  . JOINT REPLACEMENT Left    Hip   . KIDNEY STONE SURGERY  2003  . left hip replacement    . left knee    . left shoulder    . LITHOTRIPSY       reports that he has been smoking e-cigarettes.  He has a 79.50 pack-year smoking history. He has never used smokeless tobacco. He reports that he drinks alcohol. He reports that he does not use drugs.  Allergies  Allergen Reactions  . Chantix [Varenicline] Other (See Comments)    Suicidal thoughts    Family History  Problem Relation Age of Onset  . Emphysema Father   . Asthma Father   . Osteoarthritis Father   . Heart attack Mother   . Uterine cancer Mother   . Heart disease Mother   . ADD / ADHD Son   . Depression Son   . Hypertension Brother   . Heart disease Brother        Pacemaker   . Heart murmur Brother     Prior to Admission medications   Medication Sig  Start Date End Date Taking? Authorizing Provider  albuterol (PROVENTIL HFA;VENTOLIN HFA) 108 (90 Base) MCG/ACT inhaler Inhale 2 puffs into the lungs every 4 (four) hours as needed for wheezing or shortness of breath. 11/04/16  Yes Nyoka Cowden, MD  Ascorbic Acid (VITAMIN C) 1000 MG tablet Take 1,000 mg by mouth daily.   Yes [provider]  aspirin EC 81 MG tablet Take 81 mg by mouth daily.   Yes [provider]  atorvastatin (LIPITOR) 40 MG tablet Take 1 tablet (40 mg total) by mouth daily. 05/18/17 08/16/17 Yes Lewayne Bunting, MD  budesonide-formoterol [facility]) 160-4.5 MCG/ACT inhaler Inhale 2 puffs into the lungs 2 (two) times daily. 05/26/17  Yes Nyoka Cowden, MD  Calcium-Magnesium-Vitamin D (CITRACAL CALCIUM+D) 600-40-500 MG-MG-UNIT TB24 Take 1 tablet by mouth daily.     Yes [provider]  cetirizine (ZYRTEC) 10 MG tablet Take 10 mg by mouth daily.     Yes [provider]  cyanocobalamin 2000 MCG tablet Take 2,500 mcg by mouth daily.    Yes [provider]  febuxostat (ULORIC) 40 MG tablet Take 40 mg by mouth daily.   Yes [provider]  guaiFENesin (MUCINEX) 600 MG 12 hr tablet Take 1,200 mg by mouth 2 (two) times daily.   Yes [provider]  Ipratropium-Albuterol (COMBIVENT RESPIMAT) 20-100 MCG/ACT AERS respimat Inhale 1 puff into the lungs every 6 (six) hours.   Yes [provider]  ipratropium-albuterol (DUONEB) 0.5-2.5 (3) MG/3ML SOLN Take 3 mLs by nebulization every 6 (six) hours.    Yes [provider]  methotrexate (RHEUMATREX) 2.5 MG tablet Take 10 mg by mouth once a week. 4 tablets weekly (Mon) 02/11/11  Yes [provider]  Multiple Vitamin (MULTIVITAMIN) tablet Take 1 tablet by mouth daily.     Yes [provider]  pantoprazole (PROTONIX) 40 MG tablet Take 40 mg by mouth 2 (two) times daily.    Yes [provider]  predniSONE (DELTASONE) 20 MG tablet 2 daily until  better then 1 daily Patient taking differently: Take 10-20 mg by mouth 2 (two) times daily with a meal. Take 2 tablets (20 mg) in the morning and Take 1 tablet (10 mg) at bedtime 05/31/17  Yes Nyoka Cowden, MD  Tiotropium Bromide Monohydrate (SPIRIVA RESPIMAT) 2.5 MCG/ACT AERS Inhale 2 puffs into the lungs daily. 05/31/17  Yes Nyoka Cowden, MD  triamterene-hydrochlorothiazide (MAXZIDE-25) 37.5-25 MG per tablet Take 1 tablet by mouth daily. Once daily 01/13/11  Yes [provider]  InFLIXimab (REMICADE IV) Inject into the vein every 6 (six) weeks.    [provider]  OXYGEN BIPAP with 1lpm o2    [provider]  Respiratory Therapy Supplies (FLUTTER) DEVI Use as directed 11/04/16   Nyoka Cowden, MD    Physical Exam: Vitals:   07/19/17 2046 07/19/17 2112 07/19/17 2140 07/19/17 2209  BP:  134/84 (!) 151/88 131/81  Pulse:  (!) 113 (!) 118 (!) 118  Resp:  (!) 25 (!) 23 (!) 24  Temp:      TempSrc:      SpO2: 99% 100% 100% 100%  Weight:      Height:        GENERAL: 72 y.o.-year-old male patient, well-developed, well-nourished lying in the bed in no acute distress.  Pleasant and cooperative.   HEENT: Head atraumatic, normocephalic. Pupils equal. Mucus membranes moist. Small superficial ulceration to right lateral tongue. NECK: Supple. No JVD. CHEST: Moderate diffuse wheezes.  Diminshed breath sounds at the right base.  No use of accessory muscles of respiration.  No reproducible chest wall tenderness.  CARDIOVASCULAR: Tachy S1, S2 normal. No murmurs, rubs, or gallops. Cap refill <2 seconds. Pulses intact distally.  ABDOMEN: Soft, nondistended, nontender. No rebound, guarding, rigidity. Normoactive bowel sounds present in all four quadrants.  EXTREMITIES: Bilateral pitting lower extremity edema, right greater than left with open weeping wounds.  No, cyanosis, or clubbing. No calf tenderness or Homan's sign.  NEUROLOGIC: The patient is alert and oriented x 3.  Cranial nerves II through XII are grossly intact with no focal sensorimotor deficit. PSYCHIATRIC:  Normal affect, mood, thought content. SKIN: Warm, dry, and intact without obvious rash, lesion, or ulcer that as detailed above    Labs on Admission:  CBC: Recent Labs  Lab 07/19/17 1841  WBC 11.7*  NEUTROABS 9.2*  HGB 13.4  HCT 40.8  MCV 102.8*  PLT 122*   Basic Metabolic Panel: Recent Labs  Lab 07/19/17 1841  NA 142  K 4.1  CL 103  CO2 27  GLUCOSE 139*  BUN 34*  CREATININE 1.28*  CALCIUM 9.5   GFR: Estimated Creatinine Clearance: 64 mL/min (A) (by C-G formula based on SCr of 1.28 mg/dL (H)). Liver Function Tests: Recent Labs  Lab 07/19/17 1841  AST 32  ALT 44  ALKPHOS 49  BILITOT 0.7  PROT 6.4*  ALBUMIN 3.3*   No results for input(s): LIPASE, AMYLASE in the last 168 hours. No results for input(s): AMMONIA in the last 168 hours. Coagulation Profile: No results for input(s): INR, PROTIME in the last 168 hours. Cardiac Enzymes: No results for input(s): CKTOTAL, CKMB, CKMBINDEX, TROPONINI in the last 168 hours. BNP (last 3 results) No results for input(s): PROBNP in the last 8760 hours. HbA1C: No results for input(s): HGBA1C in the last 72 hours. CBG: No results for input(s): GLUCAP in the last 168 hours. Lipid Profile: No results for input(s): CHOL, HDL, LDLCALC, TRIG, CHOLHDL, LDLDIRECT in the last 72 hours. Thyroid Function Tests: No results for input(s): TSH, T4TOTAL, FREET4, T3FREE, THYROIDAB in the last 72 hours. Anemia Panel: No results for input(s): VITAMINB12, FOLATE, FERRITIN, TIBC, IRON, RETICCTPCT in the last 72 hours. Urine analysis:    Component Value Date/Time   COLORURINE YELLOW 07/19/2017 2000   APPEARANCEUR CLEAR 07/19/2017 2000   LABSPEC 1.017 07/19/2017 2000   PHURINE 5.0 07/19/2017 2000   GLUCOSEU NEGATIVE 07/19/2017 2000   HGBUR LARGE (A) 07/19/2017 2000   BILIRUBINUR NEGATIVE 07/19/2017 2000   KETONESUR NEGATIVE 07/19/2017 2000    PROTEINUR NEGATIVE 07/19/2017 2000   UROBILINOGEN 0.2 03/28/2007 1330   NITRITE NEGATIVE 07/19/2017 2000   LEUKOCYTESUR NEGATIVE 07/19/2017 2000   Sepsis Labs: @LABRCNTIP (procalcitonin:4,lacticidven:4) )No results found for this or any previous visit (from the past 240 hour(s)).   Radiological Exams on Admission: Dg Chest 2 View  Result Date: 07/19/2017 CLINICAL DATA:  Shortness of breath EXAM: CHEST - 2 VIEW COMPARISON:  03/04/2017 chest radiograph 04/05/2017 chest CT  FINDINGS: The heart size and mediastinal contours are within normal limits. Both lungs are clear. The visualized skeletal structures are unremarkable. IMPRESSION: No active cardiopulmonary disease. Electronically Signed   By: Deatra Robinson M.D.   On: 07/19/2017 17:44    EKG: Sinus tachycardia 115 bpm with intra-ventricular conduction delay and nonspecific ST-T wave changes.   Assessment/Plan  This is a 72 y.o. male with a history of hypertension, hyperlipidemia, GERD, CKD, COPD, tobacco dependence now being admitted with:  #. Acute exacerbation of COPD - IV steroids - Nebulizers, O2 therapy and expectorants as needed.  - Continuous pulse oximetry -Continue Spiriva, Symbicort, Zyrtec - Consider pulmonary consult if not improving.    #. Probable Community Acquired Pneumonia - IV Rocephin & Azithromycin per pharmacy - IV fluid hydration - Duonebs, expectorants & O2 therapy as needed - Follow up blood & sputum cultures   #.  Extremity edema Check d-dimer if positive check bilateral lower extreme Dopplers  #. Lateral tongue ulceration - Patient will monitor for improvement and follow up with PCP and pulmonary.    #. History of hypertension - Continue Maxide  #. History of GERD  - Continue Protonix  #. History of hyperlipidemia - Continue Lipitor  Admission status: Inpatient IV Fluids: Normal saline Diet/Nutrition: Heart healthy Consults called: None DVT Px: Lovenox, SCDs and early ambulation. Code  Status: Full Code  Disposition Plan: To home in 1-2 days  All the records are reviewed and case discussed with ED provider. Management plans discussed with the patient and/or family who express understanding and agree with plan of care.  Dhanya Bogle D.O. on 07/19/2017 at 10:43 PM CC: Primary care physician; [name], MD   07/19/2017, 10:43 PM

## 2017-07-20 ENCOUNTER — Inpatient Hospital Stay (HOSPITAL_COMMUNITY): Payer: Medicare Other

## 2017-07-20 ENCOUNTER — Encounter (HOSPITAL_COMMUNITY): Payer: Medicare Other

## 2017-07-20 DIAGNOSIS — Z86711 Personal history of pulmonary embolism: Secondary | ICD-10-CM

## 2017-07-20 DIAGNOSIS — R600 Localized edema: Secondary | ICD-10-CM

## 2017-07-20 DIAGNOSIS — F17210 Nicotine dependence, cigarettes, uncomplicated: Secondary | ICD-10-CM

## 2017-07-20 DIAGNOSIS — R0600 Dyspnea, unspecified: Secondary | ICD-10-CM

## 2017-07-20 DIAGNOSIS — I5033 Acute on chronic diastolic (congestive) heart failure: Secondary | ICD-10-CM

## 2017-07-20 DIAGNOSIS — I5031 Acute diastolic (congestive) heart failure: Secondary | ICD-10-CM

## 2017-07-20 LAB — CREATININE, SERUM
Creatinine, Ser: 1.32 mg/dL — ABNORMAL HIGH (ref 0.61–1.24)
GFR calc Af Amer: >60 mL/min (ref >60)
GFR calc non Af Amer: 52 mL/min — ABNORMAL LOW (ref >60)

## 2017-07-20 LAB — LACTIC ACID, PLASMA
Lactic Acid, Venous: 2.8 mmol/L (ref 0.5–1.9)
Lactic Acid, Venous: 2.9 mmol/L (ref 0.5–1.9)

## 2017-07-20 LAB — GLUCOSE, CAPILLARY
Glucose-Capillary: 309 mg/dL — ABNORMAL HIGH (ref 65–99)
Glucose-Capillary: 304 mg/dL — ABNORMAL HIGH (ref 65–99)

## 2017-07-20 LAB — CBC
HCT: 38.8 % — ABNORMAL LOW (ref 39.0–52.0)
Hemoglobin: 12.5 g/dL — ABNORMAL LOW (ref 13.0–17.0)
MCH: 33.3 pg (ref 26.0–34.0)
MCHC: 32.2 g/dL (ref 30.0–36.0)
MCV: 103.5 fL — ABNORMAL HIGH (ref 78.0–100.0)
Platelets: 101 K/uL — ABNORMAL LOW (ref 150–400)
RBC: 3.75 MIL/uL — ABNORMAL LOW (ref 4.22–5.81)
RDW: 17.6 % — ABNORMAL HIGH (ref 11.5–15.5)
WBC: 8.8 K/uL (ref 4.0–10.5)

## 2017-07-20 LAB — BASIC METABOLIC PANEL WITH GFR
Anion gap: 10 (ref 5–15)
BUN: 33 mg/dL — ABNORMAL HIGH (ref 6–20)
CO2: 24 mmol/L (ref 22–32)
Calcium: 8.8 mg/dL — ABNORMAL LOW (ref 8.9–10.3)
Chloride: 104 mmol/L (ref 101–111)
Creatinine, Ser: 1.32 mg/dL — ABNORMAL HIGH (ref 0.61–1.24)
GFR calc Af Amer: >60 mL/min (ref >60)
GFR calc non Af Amer: 52 mL/min — ABNORMAL LOW (ref >60)
Glucose, Bld: 329 mg/dL — ABNORMAL HIGH (ref 65–99)
Potassium: 4.3 mmol/L (ref 3.5–5.1)
Sodium: 138 mmol/L (ref 135–145)

## 2017-07-20 LAB — EXPECTORATED SPUTUM ASSESSMENT W GRAM STAIN, RFLX TO RESP C: Special Requests: NORMAL

## 2017-07-20 LAB — ECHOCARDIOGRAM COMPLETE
Height: 67 in
Weight: 4183.45 oz

## 2017-07-20 LAB — D-DIMER, QUANTITATIVE: D-Dimer, Quant: 16.99 ug/mL-FEU — ABNORMAL HIGH (ref 0.00–0.50)

## 2017-07-20 LAB — PHOSPHORUS: Phosphorus: 3.6 mg/dL (ref 2.5–4.6)

## 2017-07-20 LAB — MAGNESIUM: Magnesium: 1.5 mg/dL — ABNORMAL LOW (ref 1.7–2.4)

## 2017-07-20 MED ORDER — SODIUM CHLORIDE 0.9 % IV SOLN
INTRAVENOUS | Status: DC
  Administered 2017-07-20: 03:00:00 via INTRAVENOUS

## 2017-07-20 MED ORDER — VITAMIN C 500 MG PO TABS
1000.0000 mg | ORAL_TABLET | Freq: Every day | ORAL | Status: DC
  Administered 2017-07-20 – 2017-07-23 (×4): 1000 mg via ORAL
  Filled 2017-07-20 (×4): qty 2

## 2017-07-20 MED ORDER — SENNOSIDES-DOCUSATE SODIUM 8.6-50 MG PO TABS: 1.0000 | ORAL_TABLET | Freq: Every evening | ORAL | Status: DC | PRN

## 2017-07-20 MED ORDER — IPRATROPIUM-ALBUTEROL 0.5-2.5 (3) MG/3ML IN SOLN
3.0000 mL | Freq: Four times a day (QID) | RESPIRATORY_TRACT | Status: DC
  Administered 2017-07-20 – 2017-07-23 (×13): 3 mL via RESPIRATORY_TRACT
  Filled 2017-07-20 (×13): qty 3

## 2017-07-20 MED ORDER — INSULIN ASPART 100 UNIT/ML ~~LOC~~ SOLN
0.0000 [IU] | Freq: Three times a day (TID) | SUBCUTANEOUS | Status: DC
  Administered 2017-07-20 – 2017-07-21 (×2): 11 [IU] via SUBCUTANEOUS
  Administered 2017-07-21: 5 [IU] via SUBCUTANEOUS
  Administered 2017-07-21: 11 [IU] via SUBCUTANEOUS
  Administered 2017-07-22 (×3): 3 [IU] via SUBCUTANEOUS
  Administered 2017-07-23: 8 [IU] via SUBCUTANEOUS
  Administered 2017-07-23: 3 [IU] via SUBCUTANEOUS

## 2017-07-20 MED ORDER — FEBUXOSTAT 40 MG PO TABS
40.0000 mg | ORAL_TABLET | Freq: Every day | ORAL | Status: DC
  Administered 2017-07-20 – 2017-07-23 (×4): 40 mg via ORAL
  Filled 2017-07-20 (×4): qty 1

## 2017-07-20 MED ORDER — FUROSEMIDE 10 MG/ML IJ SOLN
40.0000 mg | Freq: Two times a day (BID) | INTRAMUSCULAR | Status: DC
  Administered 2017-07-20 (×2): 40 mg via INTRAVENOUS
  Filled 2017-07-20 (×2): qty 4

## 2017-07-20 MED ORDER — IPRATROPIUM-ALBUTEROL 0.5-2.5 (3) MG/3ML IN SOLN: 3.0000 mL | Freq: Four times a day (QID) | RESPIRATORY_TRACT | Status: DC

## 2017-07-20 MED ORDER — GUAIFENESIN ER 600 MG PO TB12
1200.0000 mg | ORAL_TABLET | Freq: Two times a day (BID) | ORAL | Status: DC
  Administered 2017-07-20 – 2017-07-23 (×8): 1200 mg via ORAL
  Filled 2017-07-20 (×8): qty 2

## 2017-07-20 MED ORDER — TIOTROPIUM BROMIDE MONOHYDRATE 18 MCG IN CAPS
18.0000 ug | ORAL_CAPSULE | Freq: Every day | RESPIRATORY_TRACT | Status: DC
  Administered 2017-07-20: 18 ug via RESPIRATORY_TRACT
  Filled 2017-07-20: qty 5

## 2017-07-20 MED ORDER — ATORVASTATIN CALCIUM 40 MG PO TABS
40.0000 mg | ORAL_TABLET | Freq: Every day | ORAL | Status: DC
  Administered 2017-07-20 – 2017-07-22 (×3): 40 mg via ORAL
  Filled 2017-07-20 (×3): qty 1

## 2017-07-20 MED ORDER — PANTOPRAZOLE SODIUM 40 MG PO TBEC
40.0000 mg | DELAYED_RELEASE_TABLET | Freq: Two times a day (BID) | ORAL | Status: DC
  Administered 2017-07-20 – 2017-07-23 (×8): 40 mg via ORAL
  Filled 2017-07-20 (×8): qty 1

## 2017-07-20 MED ORDER — VITAMIN B-12 1000 MCG PO TABS
2500.0000 ug | ORAL_TABLET | Freq: Every day | ORAL | Status: DC
  Administered 2017-07-20 – 2017-07-23 (×4): 2500 ug via ORAL
  Filled 2017-07-20 (×4): qty 3

## 2017-07-20 MED ORDER — IOPAMIDOL (ISOVUE-370) INJECTION 76%
INTRAVENOUS | Status: AC
  Administered 2017-07-20: 15:00:00
  Filled 2017-07-20: qty 100

## 2017-07-20 MED ORDER — METHYLPREDNISOLONE SODIUM SUCC 125 MG IJ SOLR
60.0000 mg | Freq: Four times a day (QID) | INTRAMUSCULAR | Status: DC
  Administered 2017-07-20 – 2017-07-21 (×7): 60 mg via INTRAVENOUS
  Filled 2017-07-20 (×7): qty 2

## 2017-07-20 MED ORDER — SODIUM CHLORIDE 0.9 % IV SOLN
2.0000 g | INTRAVENOUS | Status: DC
  Administered 2017-07-20 – 2017-07-21 (×2): 2 g via INTRAVENOUS
  Filled 2017-07-20 (×2): qty 2

## 2017-07-20 MED ORDER — NICOTINE 14 MG/24HR TD PT24
14.0000 mg | MEDICATED_PATCH | Freq: Every day | TRANSDERMAL | Status: DC
  Administered 2017-07-20 – 2017-07-23 (×4): 14 mg via TRANSDERMAL
  Filled 2017-07-20 (×4): qty 1

## 2017-07-20 MED ORDER — INSULIN ASPART 100 UNIT/ML ~~LOC~~ SOLN
0.0000 [IU] | Freq: Every day | SUBCUTANEOUS | Status: DC
  Administered 2017-07-20 – 2017-07-21 (×2): 4 [IU] via SUBCUTANEOUS
  Administered 2017-07-22: 3 [IU] via SUBCUTANEOUS

## 2017-07-20 MED ORDER — MAGNESIUM CITRATE PO SOLN: 1.0000 | Freq: Once | ORAL | Status: DC | PRN

## 2017-07-20 MED ORDER — LORATADINE 10 MG PO TABS
10.0000 mg | ORAL_TABLET | Freq: Every day | ORAL | Status: DC
  Administered 2017-07-20 – 2017-07-23 (×4): 10 mg via ORAL
  Filled 2017-07-20 (×4): qty 1

## 2017-07-20 MED ORDER — ACETAMINOPHEN 650 MG RE SUPP: 650.0000 mg | Freq: Four times a day (QID) | RECTAL | Status: DC | PRN

## 2017-07-20 MED ORDER — HEPARIN (PORCINE) IN NACL 100-0.45 UNIT/ML-% IJ SOLN
1600.0000 [IU]/h | INTRAMUSCULAR | Status: DC
  Administered 2017-07-20: 1600 [IU]/h via INTRAVENOUS
  Filled 2017-07-20: qty 250

## 2017-07-20 MED ORDER — BISACODYL 5 MG PO TBEC: 5.0000 mg | DELAYED_RELEASE_TABLET | Freq: Every day | ORAL | Status: DC | PRN

## 2017-07-20 MED ORDER — CALCIUM CARBONATE-VITAMIN D 500-200 MG-UNIT PO TABS
1.0000 | ORAL_TABLET | Freq: Every day | ORAL | Status: DC
Start: 2017-07-20 — End: 2017-07-23
  Administered 2017-07-20 – 2017-07-23 (×4): 1 via ORAL
  Filled 2017-07-20 (×4): qty 1

## 2017-07-20 MED ORDER — ADULT MULTIVITAMIN W/MINERALS CH
1.0000 | ORAL_TABLET | Freq: Every day | ORAL | Status: DC
  Administered 2017-07-20 – 2017-07-23 (×4): 1 via ORAL
  Filled 2017-07-20 (×4): qty 1

## 2017-07-20 MED ORDER — ENOXAPARIN SODIUM 60 MG/0.6ML ~~LOC~~ SOLN
60.0000 mg | Freq: Every day | SUBCUTANEOUS | Status: DC
  Administered 2017-07-20: 60 mg via SUBCUTANEOUS
  Filled 2017-07-20: qty 0.6

## 2017-07-20 MED ORDER — ONDANSETRON HCL 4 MG/2ML IJ SOLN: 4.0000 mg | Freq: Four times a day (QID) | INTRAMUSCULAR | Status: DC | PRN

## 2017-07-20 MED ORDER — FUROSEMIDE 10 MG/ML IJ SOLN: 40.0000 mg | Freq: Once | INTRAMUSCULAR | Status: DC

## 2017-07-20 MED ORDER — MOMETASONE FURO-FORMOTEROL FUM 200-5 MCG/ACT IN AERO
2.0000 | INHALATION_SPRAY | Freq: Two times a day (BID) | RESPIRATORY_TRACT | Status: DC
  Administered 2017-07-20 – 2017-07-23 (×7): 2 via RESPIRATORY_TRACT
  Filled 2017-07-20: qty 8.8

## 2017-07-20 MED ORDER — TRIAMTERENE-HCTZ 37.5-25 MG PO TABS
1.0000 | ORAL_TABLET | Freq: Every day | ORAL | Status: DC
  Administered 2017-07-20: 1 via ORAL
  Filled 2017-07-20: qty 1

## 2017-07-20 MED ORDER — ACETAMINOPHEN 325 MG PO TABS
650.0000 mg | ORAL_TABLET | Freq: Four times a day (QID) | ORAL | Status: DC | PRN
  Filled 2017-07-20: qty 2

## 2017-07-20 MED ORDER — ONDANSETRON HCL 4 MG PO TABS: 4.0000 mg | ORAL_TABLET | Freq: Four times a day (QID) | ORAL | Status: DC | PRN

## 2017-07-20 MED ORDER — MAGNESIUM SULFATE 2 GM/50ML IV SOLN
2.0000 g | Freq: Once | INTRAVENOUS | Status: AC
  Administered 2017-07-20: 2 g via INTRAVENOUS
  Filled 2017-07-20: qty 50

## 2017-07-20 MED ORDER — ASPIRIN EC 81 MG PO TBEC
81.0000 mg | DELAYED_RELEASE_TABLET | Freq: Every day | ORAL | Status: DC
  Administered 2017-07-20 – 2017-07-23 (×4): 81 mg via ORAL
  Filled 2017-07-20 (×4): qty 1

## 2017-07-20 MED ORDER — SODIUM CHLORIDE 0.9 % IV SOLN
500.0000 mg | INTRAVENOUS | Status: DC
  Administered 2017-07-20: 500 mg via INTRAVENOUS
  Filled 2017-07-20 (×2): qty 500

## 2017-07-20 MED ORDER — IOPAMIDOL (ISOVUE-370) INJECTION 76%
100.0000 mL | Freq: Once | INTRAVENOUS | Status: AC | PRN
  Administered 2017-07-20: 100 mL via INTRAVENOUS

## 2017-07-20 NOTE — Progress Notes (Signed)
Rx Brief note: Lovenox  Wt=118 kg, BMI=40, CrCl~64 ml/min, plts=122  Rx adjusted Lovenox to 60 mg daily (~0.5 mg/kg) in pt with BMI=40  Thanks Lorenza Evangelist 07/20/2017 2:50 AM

## 2017-07-20 NOTE — Progress Notes (Signed)
  Echocardiogram 2D Echocardiogram has been performed.  Chad Duke F 07/20/2017, 4:42 PM

## 2017-07-20 NOTE — Evaluation (Signed)
Physical Therapy Evaluation Patient Details Name: Chad Duke MRN: [id number] DOB: September 12, 1945 Today's Date: 07/20/2017   History of Present Illness  72 yo male admitted with acute exacerbation of COPD. Found to have acute PE on 6/11. Hx of CKD, COPD, obesity, AAA.     Clinical Impression  On eval, pt was Min guard assist for mobility. He walked ~10 feet x 2 in his room. Standing rest break required due to fatigue and dyspnea. Dyspnea 3/4. Pt remained on [place] O2 during session. Discussed d/c plan-he plans to return home once medically ready. Will follow and progress activity as tolerated. At this time, recommendation is for HHPT vs No follow up, depending on progress.     Follow Up Recommendations Home health PT vs No follow-up (depending on progress)    Equipment Recommendations  None recommended by PT    Recommendations for Other Services       Precautions / Restrictions Precautions Precautions: Fall Precaution Comments: monitor O2 sats Restrictions Weight Bearing Restrictions: No      Mobility  Bed Mobility               General bed mobility comments: oob in recliner  Transfers Overall transfer level: Needs assistance   Transfers: Sit to/from Stand Sit to Stand: Min guard         General transfer comment: close guard for safety. Mildly unsteady.   Ambulation/Gait Ambulation/Gait assistance: Min guard Ambulation Distance (Feet): 10 Feet(x2) Assistive device: None Gait Pattern/deviations: Step-through pattern     General Gait Details: pt walked a short distance in room only. standing rest break needed after 10 feet. remained on [place] O2. dyspnea 3/4. coughing spell once up and moving.   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 [place]        Extremity/Trunk Assessment   Upper Extremity Assessment Upper Extremity Assessment: Overall WFL for tasks assessed    Lower Extremity Assessment Lower Extremity Assessment: Generalized weakness    Cervical / Trunk Assessment Cervical / Trunk Assessment: Normal  Communication   Communication: HOH  Cognition Arousal/Alertness: Awake/alert Behavior During Therapy: WFL for tasks assessed/performed Overall Cognitive Status: Within Functional Limits for tasks assessed                                        General Comments      Exercises     Assessment/Plan    PT Assessment Patient needs continued PT services  PT Problem List Decreased mobility;Decreased activity tolerance       PT Treatment Interventions Gait training;Functional  mobility training;Therapeutic activities;Balance training;Patient/family education;Therapeutic exercise    PT Goals (Current goals can be found in the Care Plan section)  Acute Rehab PT Goals Patient Stated Goal: return to work PT Goal Formulation: With patient Time For Goal Achievement: 08/03/17 Potential to Achieve Goals: Good    Frequency Min 3X/week   Barriers to discharge        Co-evaluation               AM-PAC PT "6 Clicks" Daily Activity  Outcome Measure Difficulty turning over in bed (including adjusting bedclothes, sheets and blankets)?: A Lot Difficulty moving from lying on back to sitting on the side of the bed? : A Lot Difficulty sitting down on and standing up from a chair with arms (e.g., wheelchair, bedside commode, etc,.)?: A  Little Help needed moving to and from a bed to chair (including a wheelchair)?: A Little Help needed walking in hospital room?: A Lot Help needed climbing 3-5 steps with a railing? : Total 6 Click Score: 13    End of Session Equipment Utilized During Treatment: Oxygen Activity Tolerance: Patient limited by fatigue(limited by dyspnea) Patient left: in chair;with call bell/phone within reach   PT Visit Diagnosis: Unsteadiness on feet (R26.81);Difficulty in walking, not elsewhere classified (R26.2)    Time: 0865-7846 PT Time Calculation (min) (ACUTE ONLY): 12 min   Charges:   PT Evaluation $PT Eval Moderate Complexity: 1 Mod     PT G Codes:          Rebeca Alert, MPT Pager: [phone]

## 2017-07-20 NOTE — Consult Note (Signed)
   [facility] CM Inpatient Consult   07/20/2017  Chad Duke 06-26-1945 [id number]   Patient screened for potential [facility] Care Management program due to unplanned readmission risk score of 24% (high).  Went to bedside to speak with [name] about [facility] Care Management program. However, he was being transported off floor to radiology.   Will follow up at later time.    Raiford Noble, MSN-Ed, RN,BSN [facility] Liaison [phone]

## 2017-07-20 NOTE — Progress Notes (Signed)
Inpatient Diabetes Program Recommendations  AACE/ADA: New Consensus Statement on Inpatient Glycemic Control (2015)  Target Ranges:  Prepandial:   less than 140 mg/dL      Peak postprandial:   less than 180 mg/dL (1-2 hours)      Critically ill patients:  140 - 180 mg/dL   No results found for: GLUCAP, HGBA1C  Review of Glycemic Control   Inpatient Diabetes Program Recommendations:    Noted that lab glucose on 07/20/17 was 329 mg/dl. Recommend checking CBGs TID & HS while in the hospital and on steroids. May need to add Novolog MODERATE correction scale TID while on steroids. Check HgbA1C for home blood glucose control.  Smith Mince RN BSN CDE Diabetes Coordinator Pager: [phone]  8am-5pm

## 2017-07-20 NOTE — Progress Notes (Signed)
ANTICOAGULATION CONSULT NOTE - Initial Consult  Pharmacy Consult for Heparin Indication: pulmonary embolus  Allergies  Allergen Reactions  . Chantix [Varenicline] Other (See Comments)    Suicidal thoughts    Patient Measurements: Height: 5\' 7"  (170.2 cm) Weight: 261 lb 7.5 oz (118.6 kg) IBW/kg (Calculated) : 66.1 Heparin Dosing Weight: 93.4kg  Vital Signs: Temp: 98 F (36.7 C) (06/11 0336) Temp Source: Oral (06/11 0336) BP: 132/75 (06/11 0336) Pulse Rate: 109 (06/11 0336)  Labs: Recent Labs    07/19/17 1841 07/20/17 0303  HGB 13.4 12.5*  HCT 40.8 38.8*  PLT 122* 101*  CREATININE 1.28* 1.32*  1.32*    Estimated Creatinine Clearance: 62.3 mL/min (A) (by C-G formula based on SCr of 1.32 mg/dL (H)).   Medical History: Past Medical History:  Diagnosis Date  . AAA (abdominal aortic aneurysm) (HCC)   . Allergy    Rhinitis  . Arthritis    Rhuematoid  . Asthma   . Chronic renal insufficiency   . Diverticulosis   . Emphysema   . GERD (gastroesophageal reflux disease)   . Hearing loss of both ears   . Hiatal hernia   . Hyperlipidemia   . Hypertension   . Male hypogonadism   . Nephrolithiasis   . Nephrolithiasis   . Sleep apnea    On CPAP  . Stasis dermatitis   . Varicose veins   . Vitamin B 12 deficiency     Medications:  Scheduled:  . aspirin EC  81 mg Oral Daily  . atorvastatin  40 mg Oral q1800  . calcium-vitamin D  1 tablet Oral Daily  . enoxaparin (LOVENOX) injection  60 mg Subcutaneous Daily  . febuxostat  40 mg Oral Daily  . furosemide  40 mg Intravenous BID  . guaiFENesin  1,200 mg Oral BID  . iopamidol      . ipratropium-albuterol  3 mL Nebulization Q6H  . loratadine  10 mg Oral Daily  . methylPREDNISolone (SOLU-MEDROL) injection  60 mg Intravenous Q6H  . mometasone-formoterol  2 puff Inhalation BID  . multivitamin with minerals  1 tablet Oral Daily  . pantoprazole  40 mg Oral BID  . tiotropium  18 mcg Inhalation Daily  . cyanocobalamin   2,500 mcg Oral Daily  . vitamin C  1,000 mg Oral Daily   Infusions:  . albuterol 10 mg/hr (07/19/17 2046)  . azithromycin    . cefTRIAXone (ROCEPHIN)  IV Stopped (07/20/17 1002)   PRN: acetaminophen **OR** acetaminophen, bisacodyl, magnesium citrate, ondansetron **OR** ondansetron (ZOFRAN) IV, senna-docusate  Assessment: 72 yo male admitted with COPD exacerbation. D-dimer found to be elevated at 16.99, CTa (+) for LLL PE.  Pharmacy consulted to dose IV heparin.  Goal of Therapy:  Heparin level 0.3-0.7 units/ml Monitor platelets by anticoagulation protocol: Yes   Plan:   At 18:00 tonight, begin heparin 1600 units/hr (no bolus since received Lovenox ~10:00)  Check heparin level 8hrs after starting heparin  Daily heparin level and CBC  61, PharmD, BCPS Pager: [phone] 07/20/2017,3:12 PM

## 2017-07-20 NOTE — ED Notes (Signed)
First attempt to call report made. Waiting on nurse to call back.

## 2017-07-20 NOTE — Progress Notes (Signed)
PROGRESS NOTE    Chad Duke  BBC:[id number] DOB: 12/07/1945 DOA: 07/19/2017 PCP: [name], MD   Brief Narrative:  Chad Duke is a 72 y.o. male with a known history of hypertension, hyperlipidemia, GERD, CKD, COPD, tobacco dependence presents to the emergency department for evaluation of shortness of breath and lower extremity edem for the past several days. Legs have become increasingly swollen.   Also complains of a sore on the right side of his tongue.    Of note patient had a nuclear stress test recently which revealed an ejection fraction of 75%.  Otherwise there has been no change in status.  He was found to have Acute Respiratory Failure with Hypoxia, COPD Exacerbation, Diastolic CHF Exacerbation, and an Acute PE.  Assessment & Plan:   Active Problems:   COPD GOLD III    Cigarette smoker   Morbid obesity due to excess calories (HCC)   Acute exacerbation of chronic obstructive pulmonary disease (COPD) (HCC)   Pulmonary embolus (HCC)   Acute diastolic CHF (congestive heart failure) (HCC)   Bilateral leg edema  Acute Respiratory Failure with Hypoxia likely multifactorial in the setting of Acute PE, Exacerbation of COPD, and Acute Decompensation of Diastolic CHF -Does not wear Home O2 and is currently on 3 Liters of O2 via [place] -Continuous pulse oximetry and maintain O2 saturations greater than 90% -Continue with supplemental oxygen via nasal cannula and wean O2 as tolerated -Repeat CXR in a.m. -Continue with breathing treatments with  Acute Exacerbation of COPD -Noted to Have expiratory Wheezing -Given IV Solumedrol 125 mg in ED and started on IV 60 mg q6h after -C/w DuoNeb 3 mL TID scheduled; Given continuous Albuterol Neb in EED -C/w Dulera 2 puff IH BID and Stop Sipriva given DuoNebs -Abx with IV Azithromycin and IV Ceftriaxone started in ED; Will stop IV Ceftriaxone -C/w Guaifenesin 1,200 mg po BID -As Above -Repeat CXR in AM   Suspected Community Acquired  Pneumonia, essentially ruled out -IV Rocephin & Azithromycin started but will stop IV Ceftriaxone -IV Fluid Hydration stopped  -Treatment as Above -CTA of Chest Showed No pulmonary infiltrate, pleural effusion or Pneumothorax but did show Central Peribronchial Thickening -CXR showed The heart size and mediastinal contours are within normal limits. Both lungs are clear. The visualized skeletal structures are unremarkable. -Sputum Cx not Acceptable for Testing -Blood Cx's never sent so will order now   Acute Left Lower Lobe Pulmonary Emboli -Patient's D-Dimer was 16.99 -CTA of the chest ordered and revealed left lower lobe pulmonary emboli and had an upper limit of normal for the right ventricular/left ventricular ratio of 0.89 -Currently checking lower extremity venous duplex to rule out DVT as patient has severe leg swelling -Check echocardiogram to evaluate for right ventricular systolic dysfunction and RV strain  Lactic Acidosis -Patient's lactic acid level went from 2.73 and trended down to 2.28 however now trended back up to 2.8 -Given IV fluid hydration however is now been stopped given his massive volume overload on admission -We will diurese the patient with IV Lasix 40 mg twice daily -Follow LA Trend   Microscopic Hematuria -Patient had large hemoglobin on dipstick and greater than 50 red blood cells per high-powered field -Continue to monitor for gross hematuria as patient has been recently started on heparin drip for acute PE  Bilateral LE Edema likely in the setting of Decompensated Chronic Heart Failure with Preserved EF of 65-70% (Grade 1 Diastolic Dysfunction) -BNP was 45.5 -Strict I's/O's, Daily Weights, SLIV, and Fluid Restrict  to 1500 mL -Start IV Furosemide 40 mg BID  -Patient is -983 mL since Admit and Weight is Up 2 lbs since admission -Consult Nutrition for Evaluation of swelling and low Albumin  -WOC Nurse Consult for Leg Wounds   Hyperglycemia -Blood sugar  on BMP was 139 on Admission -No hemoglobin A1c on file so we will check this Admission -Patient on moderate NovoLog sliding scale insulin before meals and at bedtime -Continue to monitor CBGs  Essential Hypertension -Held Maxide currently given Renal Insuffiencey  -Blood pressure was 132/75 this a.m. -If necessary we will add IV hydralazine  GERD  -Continue Pantoprazole 40 mg po BID  Hyperlipidemia -Check Fasting Lipid Panel in AM -Continue Atrovastatin 40 mg po Daily   CKD Suspect Stage 3 -Patients BUN/Cr went from 34/1.28 -> 33/1.32 -Strict I's/O's -Avoid Nephrotoxic Medications and stopped Maxzide -Repeat CMP in AM   Tobacco Abuse -Used to smoke at this time. -Smoking cessation counseling given -We will add nicotine patch 14 mg transdermally daily  Hypomagnesemia -Patient magnesium level was 1.5 this morning -Replete with IV mag sulfate 2 g -Continue to monitor and replete as necessary -Repeat magnesium level in the a.m.  DVT prophylaxis: Anticoagulated with Heparin gtt Code Status: FULL CODE Family Communication: No family present at bedside  Disposition Plan: Remain Inpatient for current workup and Treatment and D/C Home with Home Health PT when medically stable for D/C  Consultants:   None   Procedures:  ECHOCARDIOGRAM ordered and pending    LE VENOUS DUPLEX ordered and pending    Antimicrobials:  Anti-infectives (From admission, onward)   Start     Dose/Rate Route Frequency Ordered Stop   07/20/17 2000  azithromycin (ZITHROMAX) 500 mg in sodium chloride 0.9 % 250 mL IVPB     500 mg 250 mL/hr over 60 Minutes Intravenous Every 24 hours 07/20/17 0241     07/20/17 1000  cefTRIAXone (ROCEPHIN) 2 g in sodium chloride 0.9 % 100 mL IVPB     2 g 200 mL/hr over 30 Minutes Intravenous Every 24 hours 07/20/17 0241     07/19/17 2045  cefTRIAXone (ROCEPHIN) 1 g in sodium chloride 0.9 % 100 mL IVPB     1 g 200 mL/hr over 30 Minutes Intravenous  Once 07/19/17  2044 07/19/17 2138   07/19/17 2045  azithromycin (ZITHROMAX) tablet 500 mg     500 mg Oral  Once 07/19/17 2044 07/19/17 2256     Subjective: In examination shortness breath was slightly better.  Still had massive leg swelling and had draining wounds.  No chest pain, nausea, vomiting.  No other complaints or concerns at this time  Objective: Vitals:   07/20/17 0153 07/20/17 0336 07/20/17 0816 07/20/17 1413  BP: (!) 131/95 132/75    Pulse: (!) 102 (!) 109    Resp:  (!) 24    Temp: 98.2 F (36.8 C) 98 F (36.7 C)    TempSrc: Oral Oral    SpO2: 97% 97% 95% 95%  Weight: 118.6 kg (261 lb 7.5 oz)     Height: 5\' 7"  (1.702 m)       Intake/Output Summary (Last 24 hours) at 07/20/2017 1636 Last data filed at 07/20/2017 1412 Gross per 24 hour  Intake 291.25 ml  Output 1275 ml  Net -983.75 ml   Filed Weights   07/19/17 1555 07/20/17 0153  Weight: 117.5 kg (259 lb) 118.6 kg (261 lb 7.5 oz)   Examination: Physical Exam:  Constitutional: WN/WD morbidly obese Caucasian Male in  NAD and appears calm sitting in chair bedside Eyes: Lids and conjunctivae normal, sclerae anicteric  ENMT: External Ears, Nose appear normal. Grossly normal hearing. Mucous membranes are moist. Neck: Appears normal, supple, no cervical masses, normal ROM, no appreciable thyromegaly; Mild JVD but difficult to assess Respiratory: Diminished to auscultation bilaterally with coarse breath sounds and some expiratory wheezing and mild crackles; No rales or rhonchi. Normal respiratory effort and patient is not tachypenic. No accessory muscle use but is wearing supplemental O2 via [place] Cardiovascular: Tachycardic Rate but regular Rhythm; Has a 2/6 Systolic murmur. S1 and S2 auscultated. 3+ LE extremity edema.  Abdomen: Soft, non-tender, Distended due to body habitus. No masses palpated. No appreciable hepatosplenomegaly. Bowel sounds positive x4.  GU: Deferred. Musculoskeletal: No clubbing / cyanosis of digits/nails. No joint  deformity upper and lower extremities.  Skin: Has bilateral Venous stasis changes with massive leg swelling and discoloration. Has open draining wounds. No rashes on a limited skin evaluation Neurologic: CN 2-12 grossly intact with no focal deficits. Romberg sign and cerebellar reflexes not assessed.  Psychiatric: Normal judgment and insight. Alert and oriented x 3. Normal mood and appropriate affect.   Data Reviewed: I have personally reviewed following labs and imaging studies  CBC: Recent Labs  Lab 07/19/17 1841 07/20/17 0303  WBC 11.7* 8.8  NEUTROABS 9.2*  --   HGB 13.4 12.5*  HCT 40.8 38.8*  MCV 102.8* 103.5*  PLT 122* 101*   Basic Metabolic Panel: Recent Labs  Lab 07/19/17 1841 07/20/17 0303  NA 142 138  K 4.1 4.3  CL 103 104  CO2 27 24  GLUCOSE 139* 329*  BUN 34* 33*  CREATININE 1.28* 1.32*  1.32*  CALCIUM 9.5 8.8*  MG  --  1.5*  PHOS  --  3.6   GFR: Estimated Creatinine Clearance: 62.3 mL/min (A) (by C-G formula based on SCr of 1.32 mg/dL (H)). Liver Function Tests: Recent Labs  Lab 07/19/17 1841  AST 32  ALT 44  ALKPHOS 49  BILITOT 0.7  PROT 6.4*  ALBUMIN 3.3*   No results for input(s): LIPASE, AMYLASE in the last 168 hours. No results for input(s): AMMONIA in the last 168 hours. Coagulation Profile: No results for input(s): INR, PROTIME in the last 168 hours. Cardiac Enzymes: No results for input(s): CKTOTAL, CKMB, CKMBINDEX, TROPONINI in the last 168 hours. BNP (last 3 results) No results for input(s): PROBNP in the last 8760 hours. HbA1C: No results for input(s): HGBA1C in the last 72 hours. CBG: No results for input(s): GLUCAP in the last 168 hours. Lipid Profile: No results for input(s): CHOL, HDL, LDLCALC, TRIG, CHOLHDL, LDLDIRECT in the last 72 hours. Thyroid Function Tests: No results for input(s): TSH, T4TOTAL, FREET4, T3FREE, THYROIDAB in the last 72 hours. Anemia Panel: No results for input(s): VITAMINB12, FOLATE, FERRITIN, TIBC,  IRON, RETICCTPCT in the last 72 hours. Sepsis Labs: Recent Labs  Lab 07/19/17 1850 07/19/17 2136 07/20/17 1137 07/20/17 1519  LATICACIDVEN 2.73* 2.28* 2.8* 2.9*    Recent Results (from the past 240 hour(s))  Culture, sputum-assessment     Status: None   Collection Time: 07/20/17  9:58 AM  Result Value Ref Range Status   Specimen Description EXPECTORATED SPUTUM  Final   Special Requests Normal  Final   Sputum evaluation   Final    Sputum specimen not acceptable for testing.  Please recollect.   INFORMED JESSICA, RN @1013  ON 6.11.19 BY [facility] Performed at [facility], [address] [place], [place]  [address]    Report Status 07/20/2017 FINAL  Final    Radiology Studies: Dg Chest 2 View  Result Date: 07/19/2017 CLINICAL DATA:  Shortness of breath EXAM: CHEST - 2 VIEW COMPARISON:  03/04/2017 chest radiograph 04/05/2017 chest CT FINDINGS: The heart size and mediastinal contours are within normal limits. Both lungs are clear. The visualized skeletal structures are unremarkable. IMPRESSION: No active cardiopulmonary disease. Electronically Signed   By: Deatra Robinson M.D.   On: 07/19/2017 17:44   Ct Angio Chest Pe W Or Wo Contrast  Result Date: 07/20/2017 CLINICAL DATA:  Shortness of breath and lower extremity edema for several days EXAM: CT ANGIOGRAPHY CHEST WITH CONTRAST TECHNIQUE: Multidetector CT imaging of the chest was performed using the standard protocol during bolus administration of intravenous contrast. Multiplanar CT image reconstructions and MIPs were obtained to evaluate the vascular anatomy. Examination limited by scattered respiratory motion artifacts. CONTRAST:  ISOVUE-370 IOPAMIDOL (ISOVUE-370) INJECTION 76% IV COMPARISON:  04/05/2017 noncontrast CT chest FINDINGS: Cardiovascular: Aorta normal caliber without aneurysm or dissection. Atherosclerotic calcifications aorta, coronary arteries and proximal great vessels. Pulmonary arteries adequately  opacified. Filling defects identified within LEFT lower lobe pulmonary arteries consistent with pulmonary embolism. No additional pulmonary emboli identified. RV/LV ratio = 0.89. No pericardial effusion. Mediastinum/Nodes: Esophagus unremarkable. Base of cervical region normal appearance. No adenopathy. Lungs/Pleura: Respiratory motion artifacts. Central peribronchial thickening. No pulmonary infiltrate, pleural effusion or pneumothorax. Minimal subsegmental atelectasis in the upper lobes and lingula. Upper Abdomen: Stomach decompressed, wall appears thickened though this could be an artifact from underdistention. Nonobstructing calculi at upper poles of both kidneys. Small LEFT renal cyst. Musculoskeletal: Bones demineralized.  LEFT shoulder prosthesis. Review of the MIP images confirms the above findings. IMPRESSION: LEFT lower lobe pulmonary emboli. Upper normal RV/LV ratio of 0.89. Nonobstructing BILATERAL renal calculi. Aortic Atherosclerosis (ICD10-I70.0). Critical Value/emergent results were called by telephone at the time of interpretation on 07/20/2017 at 1508 hrs to Dr. Marguerita Merles, who verbally acknowledged these results. Electronically Signed   By: Ulyses Southward M.D.   On: 07/20/2017 15:08   Scheduled Meds: . aspirin EC  81 mg Oral Daily  . atorvastatin  40 mg Oral q1800  . calcium-vitamin D  1 tablet Oral Daily  . febuxostat  40 mg Oral Daily  . furosemide  40 mg Intravenous BID  . guaiFENesin  1,200 mg Oral BID  . insulin aspart  0-15 Units Subcutaneous TID WC  . insulin aspart  0-5 Units Subcutaneous QHS  . iopamidol      . ipratropium-albuterol  3 mL Nebulization Q6H  . loratadine  10 mg Oral Daily  . methylPREDNISolone (SOLU-MEDROL) injection  60 mg Intravenous Q6H  . mometasone-formoterol  2 puff Inhalation BID  . multivitamin with minerals  1 tablet Oral Daily  . nicotine  14 mg Transdermal Daily  . pantoprazole  40 mg Oral BID  . cyanocobalamin  2,500 mcg Oral Daily  . vitamin C   1,000 mg Oral Daily   Continuous Infusions: . albuterol 10 mg/hr (07/19/17 2046)  . azithromycin    . cefTRIAXone (ROCEPHIN)  IV Stopped (07/20/17 1002)  . heparin      LOS: 1 day   Merlene Laughter, DO [facility] Pager [phone]  If 7PM-7AM, please contact night-coverage [email or web address] Password [facility] 07/20/2017, 4:36 PM

## 2017-07-20 NOTE — ED Notes (Signed)
ED TO INPATIENT HANDOFF REPORT  Name/Age/Gender Chad Duke 72 y.o. male  Code Status   Home/SNF/Other Home  Chief Complaint trouble breathing   Level of Care/Admitting Diagnosis ED Disposition    ED Disposition Condition [facility] Area: [place] [[id number]]  Level of Care: Telemetry [5]  Admit to tele based on following criteria: Other see comments  Comments: Sinus tachycardia  Diagnosis: Acute exacerbation of chronic obstructive pulmonary disease (COPD) [facility]) [[id number]]  Admitting Physician: Sherron Monday  Attending Physician: Sherron Monday  Estimated length of stay: past midnight tomorrow  Certification:: I certify this patient will need inpatient services for at least 2 midnights  PT Class (Do Not Modify): Inpatient [101]  PT Acc Code (Do Not Modify): Private [1]       Medical History Past Medical History:  Diagnosis Date  . AAA (abdominal aortic aneurysm) ([place])   . Allergy    Rhinitis  . Arthritis    Rhuematoid  . Asthma   . Chronic renal insufficiency   . Diverticulosis   . Emphysema   . GERD (gastroesophageal reflux disease)   . Hearing loss of both ears   . Hiatal hernia   . Hyperlipidemia   . Hypertension   . Male hypogonadism   . Nephrolithiasis   . Nephrolithiasis   . Sleep apnea    On CPAP  . Stasis dermatitis   . Varicose veins   . Vitamin B 12 deficiency     Allergies Allergies  Allergen Reactions  . Chantix [Varenicline] Other (See Comments)    Suicidal thoughts    IV Location/Drains/Wounds Patient Lines/Drains/Airways Status   Active Line/Drains/Airways    Name:   Placement date:   Placement time:   Site:   Days:   Peripheral IV 07/19/17 Right Antecubital   07/19/17    1826    Antecubital   1          Labs/Imaging Results for orders placed or performed during the hospital encounter of 07/19/17 (from the past 48 hour(s))  Comprehensive metabolic panel      Status: Abnormal   Collection Time: 07/19/17  6:41 PM  Result Value Ref Range   Sodium 142 135 - 145 mmol/L   Potassium 4.1 3.5 - 5.1 mmol/L   Chloride 103 101 - 111 mmol/L   CO2 27 22 - 32 mmol/L   Glucose, Bld 139 (H) 65 - 99 mg/dL   BUN 34 (H) 6 - 20 mg/dL   Creatinine, Ser 1.28 (H) 0.61 - 1.24 mg/dL   Calcium 9.5 8.9 - 10.3 mg/dL   Total Protein 6.4 (L) 6.5 - 8.1 g/dL   Albumin 3.3 (L) 3.5 - 5.0 g/dL   AST 32 15 - 41 U/L   ALT 44 17 - 63 U/L   Alkaline Phosphatase 49 38 - 126 U/L   Total Bilirubin 0.7 0.3 - 1.2 mg/dL   GFR calc non Af Amer 54 (L) >60 mL/min   GFR calc Af Amer >60 >60 mL/min    Comment: (NOTE) The eGFR has been calculated using the CKD EPI equation. This calculation has not been validated in all clinical situations. eGFR's persistently <60 mL/min signify possible Chronic Kidney Disease.    Anion gap 12 5 - 15    Comment: Performed at [facility], [place] [address] [place], [place] [address]  CBC with Differential/Platelet     Status: Abnormal   Collection Time: 07/19/17  6:41 PM  Result  Value Ref Range   WBC 11.7 (H) 4.0 - 10.5 K/uL   RBC 3.97 (L) 4.22 - 5.81 MIL/uL   Hemoglobin 13.4 13.0 - 17.0 g/dL   HCT 40.8 39.0 - 52.0 %   MCV 102.8 (H) 78.0 - 100.0 fL   MCH 33.8 26.0 - 34.0 pg   MCHC 32.8 30.0 - 36.0 g/dL   RDW 17.5 (H) 11.5 - 15.5 %   Platelets 122 (L) 150 - 400 K/uL   Neutrophils Relative % 79 %   Neutro Abs 9.2 (H) 1.7 - 7.7 K/uL   Lymphocytes Relative 13 %   Lymphs Abs 1.6 0.7 - 4.0 K/uL   Monocytes Relative 8 %   Monocytes Absolute 0.9 0.1 - 1.0 K/uL   Eosinophils Relative 0 %   Eosinophils Absolute 0.0 0.0 - 0.7 K/uL   Basophils Relative 0 %   Basophils Absolute 0.0 0.0 - 0.1 K/uL    Comment: Performed at [facility], [place] [address], [place], [place] [address]  Brain natriuretic peptide     Status: None   Collection Time: 07/19/17  6:42 PM  Result Value Ref Range   B Natriuretic Peptide 45.4 0.0  - 100.0 pg/mL    Comment: Performed at [facility] [place] [address], [place], [place] [address]  I-stat troponin, ED     Status: None   Collection Time: 07/19/17  6:47 PM  Result Value Ref Range   Troponin i, poc 0.01 0.00 - 0.08 ng/mL   Comment 3            Comment: Due to the release kinetics of cTnI, a negative result within the first hours of the onset of symptoms does not rule out myocardial infarction with certainty. If myocardial infarction is still suspected, repeat the test at appropriate intervals.   I-Stat CG4 Lactic Acid, ED     Status: Abnormal   Collection Time: 07/19/17  6:50 PM  Result Value Ref Range   Lactic Acid, Venous 2.73 (HH) 0.5 - 1.9 mmol/L   Comment NOTIFIED PHYSICIAN   Urinalysis, Routine w reflex microscopic     Status: Abnormal   Collection Time: 07/19/17  8:00 PM  Result Value Ref Range   Color, Urine YELLOW YELLOW   APPearance CLEAR CLEAR   Specific Gravity, Urine 1.017 1.005 - 1.030   pH 5.0 5.0 - 8.0   Glucose, UA NEGATIVE NEGATIVE mg/dL   Hgb urine dipstick LARGE (A) NEGATIVE   Bilirubin Urine NEGATIVE NEGATIVE   Ketones, ur NEGATIVE NEGATIVE mg/dL   Protein, ur NEGATIVE NEGATIVE mg/dL   Nitrite NEGATIVE NEGATIVE   Leukocytes, UA NEGATIVE NEGATIVE   RBC / HPF >50 (H) 0 - 5 RBC/hpf   WBC, UA 0-5 0 - 5 WBC/hpf   Bacteria, UA NONE SEEN NONE SEEN   Mucus PRESENT     Comment: Performed at [facility], [place] [address], [place], [place] [address]  I-Stat CG4 Lactic Acid, ED     Status: Abnormal   Collection Time: 07/19/17  9:36 PM  Result Value Ref Range   Lactic Acid, Venous 2.28 (HH) 0.5 - 1.9 mmol/L   Comment NOTIFIED PHYSICIAN    Dg Chest 2 View  Result Date: 07/19/2017 CLINICAL DATA:  Shortness of breath EXAM: CHEST - 2 VIEW COMPARISON:  03/04/2017 chest radiograph 04/05/2017 chest CT FINDINGS: The heart size and mediastinal contours are within normal limits. Both lungs are clear. The visualized  skeletal structures are unremarkable. IMPRESSION: No active  cardiopulmonary disease. Electronically Signed   By: Ulyses Jarred M.D.   On: 07/19/2017 17:44    Pending Labs Unresulted Labs (From admission, onward)   Start     Ordered   07/19/17 2254  D-dimer, quantitative (not at [facility])  STAT,   R     07/19/17 2253   Signed and Held  CBC  (enoxaparin (LOVENOX)    CrCl >/= 30 ml/min)  Once,   R    Comments:  Baseline for enoxaparin therapy IF NOT ALREADY DRAWN.  Notify MD if PLT < 100 K.    Signed and Held   Signed and Held  Creatinine, serum  (enoxaparin (LOVENOX)    CrCl >/= 30 ml/min)  Once,   R    Comments:  Baseline for enoxaparin therapy IF NOT ALREADY DRAWN.    Signed and Held   Signed and Held  Creatinine, serum  (enoxaparin (LOVENOX)    CrCl >/= 30 ml/min)  Weekly,   R    Comments:  while on enoxaparin therapy    Signed and Held   Signed and Held  Magnesium  Add-on,   R     Signed and Held   Signed and Held  Phosphorus  Add-on,   R     Signed and Held   Signed and Occupational hygienist morning,   R     Signed and Held   Signed and Held  CBC  Tomorrow morning,   R     Signed and Held   Signed and Held  Culture, sputum-assessment  Once,   R    Question:  Patient immune status  Answer:  Normal   Signed and Held      Vitals/Pain Today's Vitals   07/19/17 2209 07/19/17 2325 07/19/17 2352 07/20/17 0021  BP: 131/81 (!) 149/88 (!) 153/78 (!) 145/80  Pulse: (!) 118 (!) 118 (!) 118 (!) 114  Resp: (!) 24 (!) 25 (!) 30 (!) 30  Temp:      TempSrc:      SpO2: 100% 99% 96% 94%  Weight:      Height:      PainSc:        Isolation Precautions No active isolations  Medications Medications  albuterol (PROVENTIL,VENTOLIN) solution continuous neb (10 mg/hr Nebulization Not Given 07/19/17 2054)  sodium chloride 0.9 % bolus 500 mL (500 mLs Intravenous New Bag/Given 07/19/17 1840)  methylPREDNISolone sodium succinate (SOLU-MEDROL) 125 mg/2 mL injection 125 mg (125 mg  Intravenous Given 07/19/17 2045)  cefTRIAXone (ROCEPHIN) 1 g in sodium chloride 0.9 % 100 mL IVPB (0 g Intravenous Stopped 07/19/17 2138)  azithromycin (ZITHROMAX) tablet 500 mg (500 mg Oral Given 07/19/17 2256)  sodium chloride 0.9 % bolus 500 mL (500 mLs Intravenous New Bag/Given 07/19/17 2253)    Mobility walks

## 2017-07-20 NOTE — Plan of Care (Signed)
  Problem: Nutrition: Goal: Adequate nutrition will be maintained Outcome: Progressing   

## 2017-07-21 ENCOUNTER — Inpatient Hospital Stay (HOSPITAL_COMMUNITY): Payer: Medicare Other

## 2017-07-21 DIAGNOSIS — I82403 Acute embolism and thrombosis of unspecified deep veins of lower extremity, bilateral: Secondary | ICD-10-CM

## 2017-07-21 DIAGNOSIS — J441 Chronic obstructive pulmonary disease with (acute) exacerbation: Secondary | ICD-10-CM

## 2017-07-21 DIAGNOSIS — M7989 Other specified soft tissue disorders: Secondary | ICD-10-CM

## 2017-07-21 DIAGNOSIS — R739 Hyperglycemia, unspecified: Secondary | ICD-10-CM

## 2017-07-21 DIAGNOSIS — I2699 Other pulmonary embolism without acute cor pulmonale: Principal | ICD-10-CM

## 2017-07-21 DIAGNOSIS — R600 Localized edema: Secondary | ICD-10-CM

## 2017-07-21 LAB — COMPREHENSIVE METABOLIC PANEL
ALT: 44 U/L (ref 17–63)
AST: 26 U/L (ref 15–41)
Albumin: 3.2 g/dL — ABNORMAL LOW (ref 3.5–5.0)
Alkaline Phosphatase: 46 U/L (ref 38–126)
Anion gap: 11 (ref 5–15)
BUN: 43 mg/dL — ABNORMAL HIGH (ref 6–20)
CO2: 29 mmol/L (ref 22–32)
Calcium: 9.2 mg/dL (ref 8.9–10.3)
Chloride: 99 mmol/L — ABNORMAL LOW (ref 101–111)
Creatinine, Ser: 1.65 mg/dL — ABNORMAL HIGH (ref 0.61–1.24)
GFR calc Af Amer: 46 mL/min — ABNORMAL LOW (ref >60)
GFR calc non Af Amer: 40 mL/min — ABNORMAL LOW (ref >60)
Glucose, Bld: 199 mg/dL — ABNORMAL HIGH (ref 65–99)
Potassium: 3.5 mmol/L (ref 3.5–5.1)
Sodium: 139 mmol/L (ref 135–145)
Total Bilirubin: 0.5 mg/dL (ref 0.3–1.2)
Total Protein: 6.4 g/dL — ABNORMAL LOW (ref 6.5–8.1)

## 2017-07-21 LAB — CBC WITH DIFFERENTIAL/PLATELET
Basophils Absolute: 0 10*3/uL (ref 0.0–0.1)
Basophils Relative: 0 %
Eosinophils Absolute: 0 10*3/uL (ref 0.0–0.7)
Eosinophils Relative: 0 %
HCT: 38.4 % — ABNORMAL LOW (ref 39.0–52.0)
Hemoglobin: 12.7 g/dL — ABNORMAL LOW (ref 13.0–17.0)
Lymphocytes Relative: 7 %
Lymphs Abs: 1 10*3/uL (ref 0.7–4.0)
MCH: 33.4 pg (ref 26.0–34.0)
MCHC: 33.1 g/dL (ref 30.0–36.0)
MCV: 101.1 fL — ABNORMAL HIGH (ref 78.0–100.0)
Monocytes Absolute: 0.5 10*3/uL (ref 0.1–1.0)
Monocytes Relative: 4 %
Neutro Abs: 12 10*3/uL — ABNORMAL HIGH (ref 1.7–7.7)
Neutrophils Relative %: 89 %
Platelets: 114 10*3/uL — ABNORMAL LOW (ref 150–400)
RBC: 3.8 MIL/uL — ABNORMAL LOW (ref 4.22–5.81)
RDW: 17.2 % — ABNORMAL HIGH (ref 11.5–15.5)
WBC: 13.5 10*3/uL — ABNORMAL HIGH (ref 4.0–10.5)

## 2017-07-21 LAB — LIPID PANEL
Cholesterol: 183 mg/dL (ref 0–200)
HDL: 109 mg/dL (ref >40)
LDL Cholesterol: 65 mg/dL (ref 0–99)
Total CHOL/HDL Ratio: 1.7 RATIO
Triglycerides: 47 mg/dL (ref <150)
VLDL: 9 mg/dL (ref 0–40)

## 2017-07-21 LAB — HEMOGLOBIN A1C
Hgb A1c MFr Bld: 7.2 % — ABNORMAL HIGH (ref 4.8–5.6)
Mean Plasma Glucose: 159.94 mg/dL

## 2017-07-21 LAB — PHOSPHORUS: Phosphorus: 3.6 mg/dL (ref 2.5–4.6)

## 2017-07-21 LAB — TSH: TSH: 0.297 u[IU]/mL — ABNORMAL LOW (ref 0.350–4.500)

## 2017-07-21 LAB — GLUCOSE, CAPILLARY
Glucose-Capillary: 220 mg/dL — ABNORMAL HIGH (ref 65–99)
Glucose-Capillary: 327 mg/dL — ABNORMAL HIGH (ref 65–99)
Glucose-Capillary: 318 mg/dL — ABNORMAL HIGH (ref 65–99)

## 2017-07-21 LAB — HEPARIN LEVEL (UNFRACTIONATED)
Heparin Unfractionated: 1.44 IU/mL — ABNORMAL HIGH (ref 0.30–0.70)
Heparin Unfractionated: 1.02 IU/mL — ABNORMAL HIGH (ref 0.30–0.70)

## 2017-07-21 LAB — MAGNESIUM: Magnesium: 2.1 mg/dL (ref 1.7–2.4)

## 2017-07-21 LAB — PROCALCITONIN: Procalcitonin: <0.1 ng/mL

## 2017-07-21 MED ORDER — METHYLPREDNISOLONE SODIUM SUCC 125 MG IJ SOLR
60.0000 mg | Freq: Two times a day (BID) | INTRAMUSCULAR | Status: DC
  Administered 2017-07-22: 60 mg via INTRAVENOUS
  Filled 2017-07-21: qty 2

## 2017-07-21 MED ORDER — HEPARIN (PORCINE) IN NACL 100-0.45 UNIT/ML-% IJ SOLN
1200.0000 [IU]/h | INTRAMUSCULAR | Status: DC
  Filled 2017-07-21: qty 250

## 2017-07-21 MED ORDER — HEPARIN (PORCINE) IN NACL 100-0.45 UNIT/ML-% IJ SOLN
1000.0000 [IU]/h | INTRAMUSCULAR | Status: DC
  Administered 2017-07-21: 1000 [IU]/h via INTRAVENOUS

## 2017-07-21 NOTE — Progress Notes (Addendum)
Physical Therapy Treatment Patient Details Name: Chad Duke MRN: [id number] DOB: 02/08/1946 Today's Date: 07/21/2017    SATURATION QUALIFICATIONS: (This note is used to comply with regulatory documentation for home oxygen)  Patient Saturations on Room Air at Rest = 95%  Patient Saturations on Room Air while Ambulating = 91%    History of Present Illness 72 yo male admitted with acute exacerbation of COPD. Found to have acute PE on 6/11. Hx of CKD, COPD, obesity, AAA.     PT Comments    Improved activity tolerance on today. Pt was able to ambulate ~160 feet in hallway on today. Dyspnea 3/4 and 2 standing rest breaks needed/taken. Replaced 2L [place] O2 at end of session.  Follow Up Recommendations  No PT follow up     Equipment Recommendations  None recommended by PT    Recommendations for Other Services       Precautions / Restrictions Precautions Precautions: Fall Precaution Comments: monitor O2 sats Restrictions Weight Bearing Restrictions: No    Mobility  Bed Mobility               General bed mobility comments: oob in recliner  Transfers Overall transfer level: Modified independent   Transfers: Sit to/from Stand Sit to Stand: Modified independent (Device/Increase time)            Ambulation/Gait Ambulation/Gait assistance: Supervision Ambulation Distance (Feet): 160 Feet(2 standing rest breaks) Assistive device: None Gait Pattern/deviations: Step-through pattern     General Gait Details: mildly unsteady. cues for pursed lip breathing, pacing, and taking rest breaks as needed. O2 sat 92% on RA during ambulation, dyspnea 3/4.    Stairs             Wheelchair Mobility    Modified Rankin (Stroke Patients Only)       Balance Overall balance assessment: Mild deficits observed, not formally tested                                          Cognition Arousal/Alertness: Awake/alert Behavior During Therapy: WFL for tasks  assessed/performed Overall Cognitive Status: Within Functional Limits for tasks assessed                                        Exercises      General Comments        Pertinent Vitals/Pain Pain Assessment: No/denies pain    Home Living                      Prior Function            PT Goals (current goals can now be found in the care plan section) Progress towards PT goals: Progressing toward goals    Frequency    Min 3X/week      PT Plan Current plan remains appropriate    Co-evaluation              AM-PAC PT "6 Clicks" Daily Activity  Outcome Measure  Difficulty turning over in bed (including adjusting bedclothes, sheets and blankets)?: A Little Difficulty moving from lying on back to sitting on the side of the bed? : A Little Difficulty sitting down on and standing up from a chair with arms (e.g., wheelchair, bedside commode, etc,.)?: A Little Help needed moving  to and from a bed to chair (including a wheelchair)?: A Little Help needed walking in hospital room?: A Little Help needed climbing 3-5 steps with a railing? : A Little 6 Click Score: 18    End of Session Equipment Utilized During Treatment: Oxygen Activity Tolerance: Patient limited by fatigue Patient left: in chair;with call bell/phone within reach;with nursing/sitter in room   PT Visit Diagnosis: Unsteadiness on feet (R26.81);Difficulty in walking, not elsewhere classified (R26.2)     Time: 7824-2353 PT Time Calculation (min) (ACUTE ONLY): 20 min  Charges:  $Gait Training: 8-22 mins                    G Codes:          Rebeca Alert, MPT Pager: [phone]

## 2017-07-21 NOTE — Progress Notes (Signed)
Inpatient Diabetes Program Recommendations  AACE/ADA: New Consensus Statement on Inpatient Glycemic Control (2015)  Target Ranges:  Prepandial:   less than 140 mg/dL      Peak postprandial:   less than 180 mg/dL (1-2 hours)      Critically ill patients:  140 - 180 mg/dL   Review of Glycemic Control  Diabetes history: None Current orders for Inpatient glycemic control: Novolog Moderate 0-15 units tid, Novolog 0-5 units qhs  Inpatient Diabetes Program Recommendations:    IV Solumedrol 60 mg Q6 hours. Glucose in the 200-300 range. Consider, consider Novolog 5 units tid meal coverage if patient consumes at least 50% of meals.  Thanks,  Christena Deem RN, MSN, BC-ADM, [facility] Inpatient Diabetes Coordinator Team Pager [phone] (8a-5p)

## 2017-07-21 NOTE — Progress Notes (Signed)
LE venous duplex prelim: Appears to be DVT bilateral popliteal veins. Right side appears chronic, Left is acute vs age indeterminate.   Farrel Demark, RDMS, RVT  Gave results to patient's nurse.

## 2017-07-21 NOTE — Progress Notes (Signed)
Nutrition Brief Note  Patient identified for consult per COPD Protocol.   Wt Readings from Last 15 Encounters:  07/21/17 268 lb 8.3 oz (121.8 kg)  07/15/17 259 lb (117.5 kg)  06/29/17 267 lb (121.1 kg)  05/31/17 256 lb (116.1 kg)  05/18/17 259 lb 9.6 oz (117.8 kg)  03/29/17 262 lb (118.8 kg)  03/05/17 262 lb 12.8 oz (119.2 kg)  03/04/17 267 lb (121.1 kg)  11/04/16 264 lb (119.7 kg)  09/22/16 262 lb (118.8 kg)  09/08/16 259 lb (117.5 kg)  07/14/16 257 lb (116.6 kg)  07/09/15 249 lb (112.9 kg)  07/03/14 263 lb 11.2 oz (119.6 kg)  03/31/12 244 lb (110.7 kg)    Body mass index is 42.06 kg/m. Patient meets criteria for morbid obesity based on current BMI. Skin WDL. Pt with hx of HTN, hyperlipidemia, GERD, CKD, COPD, and tobacco use. He arrived to the ED with SOB and edema to legs for several days; also complained of a sore on the R side of his tongue. Patient admitted for COPD exacerbation.   Current diet order is Heart Healthy. Medications reviewed; 1 tablet Oscal-D/day, sliding scale Novolog, 2 g IV Mg sulfate x1 run yesterday, 60 m Solu-medrol QID, daily multivitamin with minerals, 40 mg oral Protonix BID, 2500 mcg oral vitamin B12/day, 1000 mg ascorbic acid/day.  Labs reviewed; CBGs: 220 mg/dL this AM, Cl: 99 mmol/L, BUN: 43 mg/dL, creatinine: 5.88 mg/dL, GFR: 40 mL/min.   No nutrition interventions warranted at this time. If nutrition issues arise, please consult RD.     Trenton Gammon, MS, RD, LDN, [facility] Inpatient Clinical Dietitian Pager # [phone] After hours/weekend pager # [phone]

## 2017-07-21 NOTE — Progress Notes (Signed)
PROGRESS NOTE    Chad Duke  JSE:[id number] DOB: 1945/07/05 DOA: 07/19/2017 PCP: [name], MD    Brief Narrative:  Patient is a 72 year old male with history of COPD not on O2, HTN, hyperlipidemia, CKD, obesity, tobacco and dependence admitted on 07/19/17 for respiratory failure. Patient has been on 3L of O2 since admission. Chest X-ray showed no signs of acute pulmonary disease and CTA demonstrated left PE. LE venous duplex shows bilateral DVT. Patient's respiratory failure is likely multifactorial due to COPD exacerbation and PE.    Assessment & Plan:   Active Problems:   COPD GOLD III    Cigarette smoker   Morbid obesity due to excess calories (HCC)   Acute exacerbation of chronic obstructive pulmonary disease (COPD) (HCC)   Pulmonary embolus (HCC)   Acute diastolic CHF (congestive heart failure) (HCC)   Bilateral leg edema  Acute Respiratory Failure with hypoxia secondary to COPD exacerbation and acute PE with bilateral DVT -Chest CTA demonstrated left lower lobe pulmonary emboli, LE venous doppler shows bilateral DVT: right side chronic, left acute vs age indeterminate. -Continue Duoneb 46mL qid, Dulera 2puffs bid, Solumedrol 60 mg qid, and Azithromycin 500mg  bid for COPD -Continue heparin infusion for DVT/PE -Decrease O2 to 2L from 3L and see how patient tolerates.   Bilateral leg edema -Likely chronic in nature due to venous stasis -Patient encouraged to keep feet elevated, compression stockings to help with venous return  Microscopic Hematuria -Patient had RBC >50 on UA, continue to monitor as patient is on heparin for PE  Chronic diastolic heart failure without acute exacerbation -No signs of fluid overload on chest xray or CT, normal BNP -Lasix stopped due to elevation in creatinine from 1.28 to 1.65 -Patient is stable, continue to monitor for increased signs of hypervolemia  Hypertension -Maxide was held due to elevated creatinine -Last BP reading was 135/91,  continue to monitor  Hyperglycemia -Continue to monitor CBG. Last readings: 304, 220, 327 -Continue insulin sliding scale  Suspected CAP -Unlikely patient has CAP due to patient history, CTA of chest and Chest X-ray showed no signs of pulmonary infiltrate, and normal Procalcitonin level -Discontinue IV Rocephin   DVT prophylaxis:Heparin  Code Status: Full Code Family Communication: N/A Disposition Plan: Home when improved   Consultants:     Procedures:  Echocardiogram: Left ventricle: The cavity size was normal. Wall thickness was  increased in a pattern of mild LVH. Systolic function was vigorous. The estimated ejection fraction was in the range of 65%  to 70%. Doppler parameters are consistent with abnormal left ventricular relaxation (grade 1 diastolic dysfunction).  Antimicrobials:   Azithromycin 500mg  BID   Subjective: Patient reports that he is doing better than yesterday but is not back to his baseline preadmission. He would like to go home without O2 if possible, but understands that he may need it. He continues to have shortness of breath if he moves around the room, but it does not bother him while sitting. He reports his leg swelling has decreased since admission.   Objective: Vitals:   07/21/17 0124 07/21/17 0531 07/21/17 0600 07/21/17 0904  BP:  (!) 135/91    Pulse:  91    Resp:  18    Temp:  97.7 F (36.5 C)    TempSrc:  Oral    SpO2: 97% 96%  95%  Weight:   121.8 kg (268 lb 8.3 oz)   Height:        Intake/Output Summary (Last 24 hours) at  07/21/2017 1225 Last data filed at 07/21/2017 0930 Gross per 24 hour  Intake 557.6 ml  Output 3350 ml  Net -2792.4 ml   Filed Weights   07/19/17 1555 07/20/17 0153 07/21/17 0600  Weight: 117.5 kg (259 lb) 118.6 kg (261 lb 7.5 oz) 121.8 kg (268 lb 8.3 oz)    Examination:   General: Not in pain, dyspnea on exertion,  Neurology: Awake and alert, non focal  E ENT: no pallor, no icterus, oral mucosa  moist Cardiovascular: No JVD. S1-S2 present, rhythmic, no gallops, rubs, or murmurs. Bilateral lower extremity pitting edema, right worse than left.  Pulmonary: decreased breath sounds bilaterally, mild expiratory wheezing, no rhonchi or rales. Gastrointestinal. Abdomen protuberant, no organomegaly, non tender, no rebound or guarding Skin. No rashes, dark discoloration of right lower extremity likely from venous insufficiency Musculoskeletal: no joint deformities     Data Reviewed: I have personally reviewed following labs and imaging studies  CBC: Recent Labs  Lab 07/19/17 1841 07/20/17 0303 07/21/17 0155  WBC 11.7* 8.8 13.5*  NEUTROABS 9.2*  --  12.0*  HGB 13.4 12.5* 12.7*  HCT 40.8 38.8* 38.4*  MCV 102.8* 103.5* 101.1*  PLT 122* 101* 114*   Basic Metabolic Panel: Recent Labs  Lab 07/19/17 1841 07/20/17 0303 07/21/17 0155  NA 142 138 139  K 4.1 4.3 3.5  CL 103 104 99*  CO2 27 24 29   GLUCOSE 139* 329* 199*  BUN 34* 33* 43*  CREATININE 1.28* 1.32*  1.32* 1.65*  CALCIUM 9.5 8.8* 9.2  MG  --  1.5* 2.1  PHOS  --  3.6 3.6   GFR: Estimated Creatinine Clearance: 50.6 mL/min (A) (by C-G formula based on SCr of 1.65 mg/dL (H)). Liver Function Tests: Recent Labs  Lab 07/19/17 1841 07/21/17 0155  AST 32 26  ALT 44 44  ALKPHOS 49 46  BILITOT 0.7 0.5  PROT 6.4* 6.4*  ALBUMIN 3.3* 3.2*   No results for input(s): LIPASE, AMYLASE in the last 168 hours. No results for input(s): AMMONIA in the last 168 hours. Coagulation Profile: No results for input(s): INR, PROTIME in the last 168 hours. Cardiac Enzymes: No results for input(s): CKTOTAL, CKMB, CKMBINDEX, TROPONINI in the last 168 hours. BNP (last 3 results) No results for input(s): PROBNP in the last 8760 hours. HbA1C: Recent Labs    07/21/17 0155  HGBA1C 7.2*   CBG: Recent Labs  Lab 07/20/17 1731 07/20/17 2151 07/21/17 0817 07/21/17 1147  GLUCAP 309* 304* 220* 327*   Lipid Profile: Recent Labs     07/21/17 0155  CHOL 183  HDL 109  LDLCALC 65  TRIG 47  CHOLHDL 1.7   Thyroid Function Tests: Recent Labs    07/21/17 0155  TSH 0.297*   Anemia Panel: No results for input(s): VITAMINB12, FOLATE, FERRITIN, TIBC, IRON, RETICCTPCT in the last 72 hours.    Radiology Studies: I have reviewed all of the imaging during this hospital visit personally     Scheduled Meds: . aspirin EC  81 mg Oral Daily  . atorvastatin  40 mg Oral q1800  . calcium-vitamin D  1 tablet Oral Daily  . febuxostat  40 mg Oral Daily  . guaiFENesin  1,200 mg Oral BID  . insulin aspart  0-15 Units Subcutaneous TID WC  . insulin aspart  0-5 Units Subcutaneous QHS  . ipratropium-albuterol  3 mL Nebulization Q6H  . loratadine  10 mg Oral Daily  . methylPREDNISolone (SOLU-MEDROL) injection  60 mg Intravenous Q6H  . mometasone-formoterol  2 puff Inhalation BID  . multivitamin with minerals  1 tablet Oral Daily  . nicotine  14 mg Transdermal Daily  . pantoprazole  40 mg Oral BID  . cyanocobalamin  2,500 mcg Oral Daily  . vitamin C  1,000 mg Oral Daily   Continuous Infusions: . albuterol 10 mg/hr (07/19/17 2046)  . azithromycin Stopped (07/20/17 2131)  . cefTRIAXone (ROCEPHIN)  IV 2 g (07/21/17 1006)  . heparin 1,200 Units/hr (07/21/17 0600)     LOS: 2 days    Cheri Kearns, PA-S   [facility] Pager [phone]

## 2017-07-21 NOTE — Consult Note (Signed)
WOC Nurse wound consult note Reason for Consult: Bilateral lower leg wounds Wound type: Skin tears that occurred prior to hospitalization when the patient stated he was putting on his compression hose. POA: Yes My exam was completed in [facility] [id number].  There were foam dressings to the wounds. The right lower leg has +4 pitting edema and hemosiderin staining.  The left lower leg has +3 pitting edema with some scattered hemosiderin staining--much less than the right leg.  Both pre-tibial areas have irregularly shaped skin tears with skin flaps intact.  The patient is resistant to the idea of compression wraps, but open to wrapping both lower extremities with kerlex and ACE wraps.  We talked extensively about compression therapy, it's purpose and method of action.  The patient verbalizes his understanding of this situation.  He states he uses "off the shelf" compression hose at home and is willing and able to go to a medical supply store for reusable compression wraps upon his discharge. Monitor the wound area(s) for worsening of condition such as: Signs/symptoms of infection,  Increase in size,  Development of or worsening of odor, Development of pain, or increased pain at the affected locations.  Notify the medical team if any of these develop.  Thank you for the consult.  Discussed plan of care with the patient and bedside nurse.  WOC nurse will not follow at this time.  Please re-consult the WOC team if needed.  Helmut Muster, RN, MSN, CWOCN, CNS-BC, pager [phone]

## 2017-07-21 NOTE — Progress Notes (Signed)
ANTICOAGULATION CONSULT NOTE - f/u Consult  Pharmacy Consult for Heparin Indication: pulmonary embolus  Allergies  Allergen Reactions  . Chantix [Varenicline] Other (See Comments)    Suicidal thoughts    Patient Measurements: Height: 5\' 7"  (170.2 cm) Weight: 268 lb 8.3 oz (121.8 kg) IBW/kg (Calculated) : 66.1 Heparin Dosing Weight: 93.4kg  Vital Signs: Temp: 97.6 F (36.4 C) (06/12 1316) Temp Source: Oral (06/12 1316) BP: 143/81 (06/12 1316) Pulse Rate: 99 (06/12 1316)  Labs: Recent Labs    07/19/17 1841 07/20/17 0303 07/21/17 0155 07/21/17 1224  HGB 13.4 12.5* 12.7*  --   HCT 40.8 38.8* 38.4*  --   PLT 122* 101* 114*  --   HEPARINUNFRC  --   --  1.44* 1.02*  CREATININE 1.28* 1.32*  1.32* 1.65*  --     Estimated Creatinine Clearance: 50.6 mL/min (A) (by C-G formula based on SCr of 1.65 mg/dL (H)).   Medical History: Past Medical History:  Diagnosis Date  . AAA (abdominal aortic aneurysm) (HCC)   . Allergy    Rhinitis  . Arthritis    Rhuematoid  . Asthma   . Chronic renal insufficiency   . Diverticulosis   . Emphysema   . GERD (gastroesophageal reflux disease)   . Hearing loss of both ears   . Hiatal hernia   . Hyperlipidemia   . Hypertension   . Male hypogonadism   . Nephrolithiasis   . Nephrolithiasis   . Sleep apnea    On CPAP  . Stasis dermatitis   . Varicose veins   . Vitamin B 12 deficiency     Medications:  Scheduled:  . aspirin EC  81 mg Oral Daily  . atorvastatin  40 mg Oral q1800  . calcium-vitamin D  1 tablet Oral Daily  . febuxostat  40 mg Oral Daily  . guaiFENesin  1,200 mg Oral BID  . insulin aspart  0-15 Units Subcutaneous TID WC  . insulin aspart  0-5 Units Subcutaneous QHS  . ipratropium-albuterol  3 mL Nebulization Q6H  . loratadine  10 mg Oral Daily  . methylPREDNISolone (SOLU-MEDROL) injection  60 mg Intravenous Q6H  . mometasone-formoterol  2 puff Inhalation BID  . multivitamin with minerals  1 tablet Oral Daily  .  nicotine  14 mg Transdermal Daily  . pantoprazole  40 mg Oral BID  . cyanocobalamin  2,500 mcg Oral Daily  . vitamin C  1,000 mg Oral Daily   Infusions:  . albuterol 10 mg/hr (07/19/17 2046)  . heparin 1,200 Units/hr (07/21/17 0600)   PRN: acetaminophen **OR** acetaminophen, bisacodyl, magnesium citrate, ondansetron **OR** ondansetron (ZOFRAN) IV, senna-docusate  Assessment: 72 yo male admitted with COPD exacerbation. D-dimer found to be elevated at 16.99, CTa (+) for LLL PE.  Pharmacy consulted to dose IV heparin.  Today, 6/12  0155 HL= 1.44 above goal, no bleeding or infusion issues per RN. Drawn from opposite arm as infusion. H/H Stable, plts low but stable  1224 HL=1.02 above goal, no bleeding or infusion issues per RN  Goal of Therapy:  Heparin level 0.3-0.7 units/ml Monitor platelets by anticoagulation protocol: Yes   Plan:   Hold heparin x 1 hour then  Decrease heparin drip to 1000 units/hr  Recheck HL in 8 hours  Daily heparin level and CBC  8/12 RPh 07/21/2017, 2:15 PM Pager [phone]

## 2017-07-21 NOTE — Progress Notes (Signed)
ANTICOAGULATION CONSULT NOTE - f/u Consult  Pharmacy Consult for Heparin Indication: pulmonary embolus  Allergies  Allergen Reactions  . Chantix [Varenicline] Other (See Comments)    Suicidal thoughts    Patient Measurements: Height: 5\' 7"  (170.2 cm) Weight: 261 lb 7.5 oz (118.6 kg) IBW/kg (Calculated) : 66.1 Heparin Dosing Weight: 93.4kg  Vital Signs: Temp: 97.7 F (36.5 C) (06/11 2018) Temp Source: Oral (06/11 2018) BP: 145/88 (06/11 2019) Pulse Rate: 103 (06/11 2019)  Labs: Recent Labs    07/19/17 1841 07/20/17 0303 07/21/17 0155  HGB 13.4 12.5* 12.7*  HCT 40.8 38.8* 38.4*  PLT 122* 101* 114*  HEPARINUNFRC  --   --  1.44*  CREATININE 1.28* 1.32*  1.32* 1.65*    Estimated Creatinine Clearance: 49.9 mL/min (A) (by C-G formula based on SCr of 1.65 mg/dL (H)).   Medical History: Past Medical History:  Diagnosis Date  . AAA (abdominal aortic aneurysm) (HCC)   . Allergy    Rhinitis  . Arthritis    Rhuematoid  . Asthma   . Chronic renal insufficiency   . Diverticulosis   . Emphysema   . GERD (gastroesophageal reflux disease)   . Hearing loss of both ears   . Hiatal hernia   . Hyperlipidemia   . Hypertension   . Male hypogonadism   . Nephrolithiasis   . Nephrolithiasis   . Sleep apnea    On CPAP  . Stasis dermatitis   . Varicose veins   . Vitamin B 12 deficiency     Medications:  Scheduled:  . aspirin EC  81 mg Oral Daily  . atorvastatin  40 mg Oral q1800  . calcium-vitamin D  1 tablet Oral Daily  . febuxostat  40 mg Oral Daily  . furosemide  40 mg Intravenous BID  . guaiFENesin  1,200 mg Oral BID  . insulin aspart  0-15 Units Subcutaneous TID WC  . insulin aspart  0-5 Units Subcutaneous QHS  . ipratropium-albuterol  3 mL Nebulization Q6H  . loratadine  10 mg Oral Daily  . methylPREDNISolone (SOLU-MEDROL) injection  60 mg Intravenous Q6H  . mometasone-formoterol  2 puff Inhalation BID  . multivitamin with minerals  1 tablet Oral Daily  .  nicotine  14 mg Transdermal Daily  . pantoprazole  40 mg Oral BID  . cyanocobalamin  2,500 mcg Oral Daily  . vitamin C  1,000 mg Oral Daily   Infusions:  . albuterol 10 mg/hr (07/19/17 2046)  . azithromycin Stopped (07/20/17 2131)  . cefTRIAXone (ROCEPHIN)  IV Stopped (07/20/17 1002)  . heparin     PRN: acetaminophen **OR** acetaminophen, bisacodyl, magnesium citrate, ondansetron **OR** ondansetron (ZOFRAN) IV, senna-docusate  Assessment: 72 yo male admitted with COPD exacerbation. D-dimer found to be elevated at 16.99, CTa (+) for LLL PE.  Pharmacy consulted to dose IV heparin. Today, 6/12  0155 HL= 1.44 above goal, no bleeding or infusion issues per RN. Drawn from opposite arm as infusion. H/H Stable, plts low but stable  Goal of Therapy:  Heparin level 0.3-0.7 units/ml Monitor platelets by anticoagulation protocol: Yes   Plan:   Decrease heparin drip to 1200 units/hr  Recheck HL in 8 hours  Daily heparin level and CBC   8/12 R 07/21/2017,4:19 AM

## 2017-07-22 DIAGNOSIS — I824Y3 Acute embolism and thrombosis of unspecified deep veins of proximal lower extremity, bilateral: Secondary | ICD-10-CM

## 2017-07-22 LAB — BASIC METABOLIC PANEL
Anion gap: 11 (ref 5–15)
BUN: 56 mg/dL — ABNORMAL HIGH (ref 6–20)
CO2: 29 mmol/L (ref 22–32)
Calcium: 9.1 mg/dL (ref 8.9–10.3)
Chloride: 100 mmol/L — ABNORMAL LOW (ref 101–111)
Creatinine, Ser: 1.49 mg/dL — ABNORMAL HIGH (ref 0.61–1.24)
GFR calc Af Amer: 52 mL/min — ABNORMAL LOW (ref >60)
GFR calc non Af Amer: 45 mL/min — ABNORMAL LOW (ref >60)
Glucose, Bld: 188 mg/dL — ABNORMAL HIGH (ref 65–99)
Potassium: 3.3 mmol/L — ABNORMAL LOW (ref 3.5–5.1)
Sodium: 140 mmol/L (ref 135–145)

## 2017-07-22 LAB — HEPARIN LEVEL (UNFRACTIONATED)
Heparin Unfractionated: 1.1 IU/mL — ABNORMAL HIGH (ref 0.30–0.70)
Heparin Unfractionated: 0.68 IU/mL (ref 0.30–0.70)

## 2017-07-22 LAB — GLUCOSE, CAPILLARY
Glucose-Capillary: 328 mg/dL — ABNORMAL HIGH (ref 65–99)
Glucose-Capillary: 188 mg/dL — ABNORMAL HIGH (ref 65–99)
Glucose-Capillary: 304 mg/dL — ABNORMAL HIGH (ref 65–99)
Glucose-Capillary: 181 mg/dL — ABNORMAL HIGH (ref 65–99)
Glucose-Capillary: 268 mg/dL — ABNORMAL HIGH (ref 65–99)

## 2017-07-22 LAB — CBC
HCT: 36.1 % — ABNORMAL LOW (ref 39.0–52.0)
Hemoglobin: 11.8 g/dL — ABNORMAL LOW (ref 13.0–17.0)
MCH: 33.1 pg (ref 26.0–34.0)
MCHC: 32.7 g/dL (ref 30.0–36.0)
MCV: 101.4 fL — ABNORMAL HIGH (ref 78.0–100.0)
Platelets: 122 10*3/uL — ABNORMAL LOW (ref 150–400)
RBC: 3.56 MIL/uL — ABNORMAL LOW (ref 4.22–5.81)
RDW: 17 % — ABNORMAL HIGH (ref 11.5–15.5)
WBC: 14.4 10*3/uL — ABNORMAL HIGH (ref 4.0–10.5)

## 2017-07-22 LAB — HEMOGLOBIN A1C
Hgb A1c MFr Bld: 7.5 % — ABNORMAL HIGH (ref 4.8–5.6)
Mean Plasma Glucose: 168.55 mg/dL

## 2017-07-22 LAB — MAGNESIUM: Magnesium: 2 mg/dL (ref 1.7–2.4)

## 2017-07-22 MED ORDER — POTASSIUM CHLORIDE CRYS ER 20 MEQ PO TBCR
40.0000 meq | EXTENDED_RELEASE_TABLET | ORAL | Status: AC
  Administered 2017-07-22 (×2): 40 meq via ORAL
  Filled 2017-07-22 (×2): qty 2

## 2017-07-22 MED ORDER — PREDNISONE 50 MG PO TABS
50.0000 mg | ORAL_TABLET | Freq: Every day | ORAL | Status: DC
  Administered 2017-07-23: 50 mg via ORAL
  Filled 2017-07-22: qty 1

## 2017-07-22 MED ORDER — HEPARIN (PORCINE) IN NACL 100-0.45 UNIT/ML-% IJ SOLN: 700.0000 [IU]/h | INTRAMUSCULAR | Status: DC

## 2017-07-22 MED ORDER — APIXABAN 5 MG PO TABS
10.0000 mg | ORAL_TABLET | Freq: Two times a day (BID) | ORAL | Status: DC
  Administered 2017-07-22 – 2017-07-23 (×3): 10 mg via ORAL
  Filled 2017-07-22 (×3): qty 2

## 2017-07-22 MED ORDER — LEVALBUTEROL HCL 0.63 MG/3ML IN NEBU: 0.6300 mg | INHALATION_SOLUTION | RESPIRATORY_TRACT | Status: DC | PRN

## 2017-07-22 MED ORDER — FUROSEMIDE 40 MG PO TABS
40.0000 mg | ORAL_TABLET | Freq: Every day | ORAL | Status: DC
  Administered 2017-07-22 – 2017-07-23 (×2): 40 mg via ORAL
  Filled 2017-07-22 (×2): qty 1

## 2017-07-22 MED ORDER — APIXABAN 5 MG PO TABS: 5.0000 mg | ORAL_TABLET | Freq: Two times a day (BID) | ORAL | Status: DC

## 2017-07-22 MED ORDER — GUAIFENESIN 100 MG/5ML PO SOLN
5.0000 mL | ORAL | Status: DC | PRN
  Administered 2017-07-22: 100 mg via ORAL
  Filled 2017-07-22: qty 10

## 2017-07-22 MED ORDER — METHYLPREDNISOLONE SODIUM SUCC 125 MG IJ SOLR
60.0000 mg | Freq: Two times a day (BID) | INTRAMUSCULAR | Status: AC
  Administered 2017-07-22: 60 mg via INTRAVENOUS
  Filled 2017-07-22: qty 2

## 2017-07-22 NOTE — Progress Notes (Signed)
ANTICOAGULATION CONSULT NOTE - f/u Consult  Pharmacy Consult for Heparin Indication: pulmonary embolus  Allergies  Allergen Reactions  . Chantix [Varenicline] Other (See Comments)    Suicidal thoughts    Patient Measurements: Height: 5\' 7"  (170.2 cm) Weight: 268 lb 8.3 oz (121.8 kg) IBW/kg (Calculated) : 66.1 Heparin Dosing Weight: 93.4kg  Vital Signs: Temp: 97.8 F (36.6 C) (06/12 2157) Temp Source: Oral (06/12 2157) BP: 125/84 (06/12 2157) Pulse Rate: 103 (06/12 2157)  Labs: Recent Labs    07/19/17 1841 07/20/17 0303 07/21/17 0155 07/21/17 1224 07/21/17 2249  HGB 13.4 12.5* 12.7*  --   --   HCT 40.8 38.8* 38.4*  --   --   PLT 122* 101* 114*  --   --   HEPARINUNFRC  --   --  1.44* 1.02* 1.10*  CREATININE 1.28* 1.32*  1.32* 1.65*  --   --     Estimated Creatinine Clearance: 50.6 mL/min (A) (by C-G formula based on SCr of 1.65 mg/dL (H)).   Medical History: Past Medical History:  Diagnosis Date  . AAA (abdominal aortic aneurysm) (HCC)   . Allergy    Rhinitis  . Arthritis    Rhuematoid  . Asthma   . Chronic renal insufficiency   . Diverticulosis   . Emphysema   . GERD (gastroesophageal reflux disease)   . Hearing loss of both ears   . Hiatal hernia   . Hyperlipidemia   . Hypertension   . Male hypogonadism   . Nephrolithiasis   . Nephrolithiasis   . Sleep apnea    On CPAP  . Stasis dermatitis   . Varicose veins   . Vitamin B 12 deficiency     Medications:  Scheduled:  . aspirin EC  81 mg Oral Daily  . atorvastatin  40 mg Oral q1800  . calcium-vitamin D  1 tablet Oral Daily  . febuxostat  40 mg Oral Daily  . guaiFENesin  1,200 mg Oral BID  . insulin aspart  0-15 Units Subcutaneous TID WC  . insulin aspart  0-5 Units Subcutaneous QHS  . ipratropium-albuterol  3 mL Nebulization Q6H  . loratadine  10 mg Oral Daily  . methylPREDNISolone (SOLU-MEDROL) injection  60 mg Intravenous Q12H  . mometasone-formoterol  2 puff Inhalation BID  .  multivitamin with minerals  1 tablet Oral Daily  . nicotine  14 mg Transdermal Daily  . pantoprazole  40 mg Oral BID  . cyanocobalamin  2,500 mcg Oral Daily  . vitamin C  1,000 mg Oral Daily   Infusions:  . albuterol 10 mg/hr (07/19/17 2046)  . heparin     PRN: acetaminophen **OR** acetaminophen, bisacodyl, magnesium citrate, ondansetron **OR** ondansetron (ZOFRAN) IV, senna-docusate  Assessment: 72 yo male admitted with COPD exacerbation. D-dimer found to be elevated at 16.99, CTa (+) for LLL PE.  Pharmacy consulted to dose IV heparin.  Today, 6/12  0155 HL= 1.44 above goal, no bleeding or infusion issues per RN. Drawn from opposite arm as infusion. H/H Stable, plts low but stable  1224 HL=1.02 above goal, no bleeding or infusion issues per RN  2249 HL= 1.10 still above goal !?, no infusion issues per RN and confirmed drawn from left side and heparin running on right side. Some bleeding from IV site, but easily stopped with pressure.   Goal of Therapy:  Heparin level 0.3-0.7 units/ml Monitor platelets by anticoagulation protocol: Yes   Plan:   Decrease heparin drip to 700 units/hr  Recheck HL in 8  hours  Daily heparin level and CBC  Lorenza Evangelist 07/22/2017, 12:38 AM

## 2017-07-22 NOTE — Discharge Instructions (Signed)
Information on my medicine - ELIQUIS (apixaban)  Why was Eliquis prescribed for you? Eliquis was prescribed to treat blood clots that may have been found in the veins of your legs (deep vein thrombosis) or in your lungs (pulmonary embolism) and to reduce the risk of them occurring again.  What do You need to know about Eliquis ? The starting dose is 10 mg (two 5 mg tablets) taken TWICE daily for the FIRST SEVEN (7) DAYS, then on 07/29/17  the dose is reduced to ONE 5 mg tablet taken TWICE daily.  Eliquis may be taken with or without food.   Try to take the dose about the same time in the morning and in the evening. If you have difficulty swallowing the tablet whole please discuss with your pharmacist how to take the medication safely.  Take Eliquis exactly as prescribed and DO NOT stop taking Eliquis without talking to the doctor who prescribed the medication.  Stopping may increase your risk of developing a new blood clot.  Refill your prescription before you run out.  After discharge, you should have regular check-up appointments with your healthcare provider that is prescribing your Eliquis.    What do you do if you miss a dose? If a dose of ELIQUIS is not taken at the scheduled time, take it as soon as possible on the same day and twice-daily administration should be resumed. The dose should not be doubled to make up for a missed dose.  Important Safety Information A possible side effect of Eliquis is bleeding. You should call your healthcare provider right away if you experience any of the following: ? Bleeding from an injury or your nose that does not stop. ? Unusual colored urine (red or dark brown) or unusual colored stools (red or black). ? Unusual bruising for unknown reasons. ? A serious fall or if you hit your head (even if there is no bleeding).  Some medicines may interact with Eliquis and might increase your risk of bleeding or clotting while on Eliquis. To help  avoid this, consult your healthcare provider or pharmacist prior to using any new prescription or non-prescription medications, including herbals, vitamins, non-steroidal anti-inflammatory drugs (NSAIDs) and supplements.  This website has more information on Eliquis (apixaban): http://www.eliquis.com/eliquis/home

## 2017-07-22 NOTE — Progress Notes (Addendum)
ANTICOAGULATION CONSULT NOTE - f/u Consult  Pharmacy Consult for Heparin Indication: pulmonary embolus  Allergies  Allergen Reactions  . Chantix [Varenicline] Other (See Comments)    Suicidal thoughts    Patient Measurements: Height: 5\' 7"  (170.2 cm) Weight: 260 lb 9.3 oz (118.2 kg) IBW/kg (Calculated) : 66.1 Heparin Dosing Weight: 93.4kg  Vital Signs: BP: 144/86 (06/13 0546) Pulse Rate: 92 (06/13 0546)  Labs: Recent Labs    07/20/17 0303  07/21/17 0155 07/21/17 1224 07/21/17 2249 07/22/17 0411 07/22/17 0911  HGB 12.5*  --  12.7*  --   --  11.8*  --   HCT 38.8*  --  38.4*  --   --  36.1*  --   PLT 101*  --  114*  --   --  122*  --   HEPARINUNFRC  --    < > 1.44* 1.02* 1.10*  --  0.68  CREATININE 1.32*  1.32*  --  1.65*  --   --  1.49*  --    < > = values in this interval not displayed.    Estimated Creatinine Clearance: 55.1 mL/min (A) (by C-G formula based on SCr of 1.49 mg/dL (H)).   Medical History: Past Medical History:  Diagnosis Date  . AAA (abdominal aortic aneurysm) (HCC)   . Allergy    Rhinitis  . Arthritis    Rhuematoid  . Asthma   . Chronic renal insufficiency   . Diverticulosis   . Emphysema   . GERD (gastroesophageal reflux disease)   . Hearing loss of both ears   . Hiatal hernia   . Hyperlipidemia   . Hypertension   . Male hypogonadism   . Nephrolithiasis   . Nephrolithiasis   . Sleep apnea    On CPAP  . Stasis dermatitis   . Varicose veins   . Vitamin B 12 deficiency     Medications:  Scheduled:  . aspirin EC  81 mg Oral Daily  . atorvastatin  40 mg Oral q1800  . calcium-vitamin D  1 tablet Oral Daily  . febuxostat  40 mg Oral Daily  . guaiFENesin  1,200 mg Oral BID  . insulin aspart  0-15 Units Subcutaneous TID WC  . insulin aspart  0-5 Units Subcutaneous QHS  . ipratropium-albuterol  3 mL Nebulization Q6H  . loratadine  10 mg Oral Daily  . methylPREDNISolone (SOLU-MEDROL) injection  60 mg Intravenous Q12H  .  mometasone-formoterol  2 puff Inhalation BID  . multivitamin with minerals  1 tablet Oral Daily  . nicotine  14 mg Transdermal Daily  . pantoprazole  40 mg Oral BID  . potassium chloride  40 mEq Oral Q4H  . cyanocobalamin  2,500 mcg Oral Daily  . vitamin C  1,000 mg Oral Daily   Infusions:  . albuterol 10 mg/hr (07/19/17 2046)  . heparin 700 Units/hr (07/22/17 0103)   PRN: acetaminophen **OR** acetaminophen, bisacodyl, magnesium citrate, ondansetron **OR** ondansetron (ZOFRAN) IV, senna-docusate  Assessment: 72 yo male admitted with COPD exacerbation. D-dimer found to be elevated at 16.99, CTa (+) for LLL PE.  Pharmacy consulted to dose IV heparin.  Today, 07/22/2017:  Heparin therapeutic (HL=0.68) on heparin infusion at 700 units/hr  Hg low/trending down, pltc stable  No active bleeding note, hematoma on abdomen increasing in size  Goal of Therapy:  Heparin level 0.3-0.7 units/ml Monitor platelets by anticoagulation protocol: Yes   Plan:   Continue heparin drip at 700 units/hr  Recheck HL in 8 hours  Daily heparin  level and CBC while on heparin  F/U long-term anticoagulation plans  Junita Push, PharmD, BCPS Pager: [phone] 07/22/2017, 10:07 AM   Addendum: Change IV heparin to Eliquis for discharge Eliquis 10mg  PO BID x7days followed by 5mg  PO BID Monitor for s/sx of bleeding  , PharmD, BCPS Pager: [phone] 07/22/2017@11 :34 AM

## 2017-07-22 NOTE — Progress Notes (Signed)
PROGRESS NOTE [facility]   Chad Duke   SHF:[id number] DOB: [birth year]  DOA: 07/19/2017 PCP: [name], MD   Brief Narrative:  Chad Duke is a 72 year old male with medical history of COPD, hypertension, CKD and OSA presented to the emergency department for evaluation of shortness of breath and lower extremity edema.  Upon ED evaluation was found to be hypoxic, with chest x-ray concerning of community-acquired pneumonia and patient was admitted with working diagnosis of acute COPD exacerbation.  Lab work-up revealed elevated d-dimer.  CTA was performed which showed left lower lobe pulmonary emboli, lower extremity Dopplers show bilateral DVT.  Subjective: Patient seen and examined, reported his breathing has improved, lower extremity swelling also improving.  Denies chest pain and palpitations.  No acute events overnight.  Afebrile.  Assessment & Plan: Acute respiratory failure with hypoxia secondary to COPD and pulmonary embolism CTA showed left lower lobe pulmonary emboli, lower extremity Doppler shows bilateral DVT Treat underlying causes  COPD exacerbation Procalcitonin negative, discontinue azithromycin and Rocephin Continue Solu-Medrol taper will transition to prednisone in a.m., continue nebulizer treatment Wean O2 to keep saturations above 91% Continue supportive treatment  Acute pulmonary embolism with bilateral DVT Initially treated with heparin drip, transitioned to Eliquis Unprovoked, possible sedentary life  May need anticoagulation for life  Lower extremity edema bilaterally Likely chronic due to venous insufficiency Unlikely CHF, normal BNP, echocardiogram with EF of 65 to 70% and grade 1 diastolic dysfunction Keep legs elevated, compression stocking  Chronic diastolic heart failure Chest x-ray with no signs of fluid overload, normal BNP  IV Lasix were held due to increasing creatinine, will start oral Lasix today Monitor I&O's and daily  weights  Essential hypertension BP stable Not on antihypertensive medications   CAP ruled out Procalcitonin negative, CT chest and chest x-ray with no signs of infection Discontinue all antibiotics    New diagnosis of diabetes mellitus type 2 with hyperglycemia Patient on chronic steroids for COPD, A1c 7.5 Continue SSI, will start metformin upon discharge  AKI  Related to Lasix  Creatinine improved, monitor renal function in a.m.  Hypokalemia Replete Check BMP and magnesium in a.m.  DVT prophylaxis: Eliquis Code Status: Full code Family Communication: None at bedside Disposition Plan: Home in a.m. if remains stable  Consultants:   None  Procedures:   None  Antimicrobials: Anti-infectives (From admission, onward)   Start     Dose/Rate Route Frequency Ordered Stop   07/20/17 2000  azithromycin (ZITHROMAX) 500 mg in sodium chloride 0.9 % 250 mL IVPB  Status:  Discontinued     500 mg 250 mL/hr over 60 Minutes Intravenous Every 24 hours 07/20/17 0241 07/21/17 1400   07/20/17 1000  cefTRIAXone (ROCEPHIN) 2 g in sodium chloride 0.9 % 100 mL IVPB  Status:  Discontinued     2 g 200 mL/hr over 30 Minutes Intravenous Every 24 hours 07/20/17 0241 07/21/17 1400   07/19/17 2045  cefTRIAXone (ROCEPHIN) 1 g in sodium chloride 0.9 % 100 mL IVPB     1 g 200 mL/hr over 30 Minutes Intravenous  Once 07/19/17 2044 07/19/17 2138   07/19/17 2045  azithromycin (ZITHROMAX) tablet 500 mg     500 mg Oral  Once 07/19/17 2044 07/19/17 2256           Objective: Vitals:   07/22/17 0546 07/22/17 0900 07/22/17 1259 07/22/17 1440  BP: (!) 144/86  117/80   Pulse: 92  (!) 104   Resp: 18  20   Temp:  98.1 F (36.7 C)   TempSrc:   Oral   SpO2: 95% 93% 92% 94%  Weight:      Height:        Intake/Output Summary (Last 24 hours) at 07/22/2017 1628 Last data filed at 07/22/2017 1400 Gross per 24 hour  Intake 1134.52 ml  Output 400 ml  Net 734.52 ml   Filed Weights   07/20/17 0153  07/21/17 0600 07/22/17 0500  Weight: 118.6 kg (261 lb 7.5 oz) 121.8 kg (268 lb 8.3 oz) 118.2 kg (260 lb 9.3 oz)    Examination:  General exam: Appears calm and comfortable  HEENT: OP moist and clear Respiratory system: Decreased breath sounds bilaterally, mild expiratory wheezing Cardiovascular system: S1 & S2 heard, RRR. No JVD, murmurs, rubs or gallops Gastrointestinal system: Abdomen is nondistended, soft and nontender, large hematoma RLQ Central nervous system: Alert and oriented. No focal neurological deficits. Extremities: Lateral lower extremity edema 3+ pitting, improving Skin: No rashes Psychiatry: Mood & affect appropriate.    Data Reviewed: I have personally reviewed following labs and imaging studies  CBC: Recent Labs  Lab 07/19/17 1841 07/20/17 0303 07/21/17 0155 07/22/17 0411  WBC 11.7* 8.8 13.5* 14.4*  NEUTROABS 9.2*  --  12.0*  --   HGB 13.4 12.5* 12.7* 11.8*  HCT 40.8 38.8* 38.4* 36.1*  MCV 102.8* 103.5* 101.1* 101.4*  PLT 122* 101* 114* 122*   Basic Metabolic Panel: Recent Labs  Lab 07/19/17 1841 07/20/17 0303 07/21/17 0155 07/22/17 0411  NA 142 138 139 140  K 4.1 4.3 3.5 3.3*  CL 103 104 99* 100*  CO2 27 24 29 29   GLUCOSE 139* 329* 199* 188*  BUN 34* 33* 43* 56*  CREATININE 1.28* 1.32*  1.32* 1.65* 1.49*  CALCIUM 9.5 8.8* 9.2 9.1  MG  --  1.5* 2.1 2.0  PHOS  --  3.6 3.6  --    GFR: Estimated Creatinine Clearance: 55.1 mL/min (A) (by C-G formula based on SCr of 1.49 mg/dL (H)). Liver Function Tests: Recent Labs  Lab 07/19/17 1841 07/21/17 0155  AST 32 26  ALT 44 44  ALKPHOS 49 46  BILITOT 0.7 0.5  PROT 6.4* 6.4*  ALBUMIN 3.3* 3.2*   No results for input(s): LIPASE, AMYLASE in the last 168 hours. No results for input(s): AMMONIA in the last 168 hours. Coagulation Profile: No results for input(s): INR, PROTIME in the last 168 hours. Cardiac Enzymes: No results for input(s): CKTOTAL, CKMB, CKMBINDEX, TROPONINI in the last 168  hours. BNP (last 3 results) No results for input(s): PROBNP in the last 8760 hours. HbA1C: Recent Labs    07/21/17 0155 07/22/17 0411  HGBA1C 7.2* 7.5*   CBG: Recent Labs  Lab 07/21/17 1147 07/21/17 1656 07/21/17 2155 07/22/17 0739 07/22/17 1126  GLUCAP 327* 318* 328* 188* 304*   Lipid Profile: Recent Labs    07/21/17 0155  CHOL 183  HDL 109  LDLCALC 65  TRIG 47  CHOLHDL 1.7   Thyroid Function Tests: Recent Labs    07/21/17 0155  TSH 0.297*   Anemia Panel: No results for input(s): VITAMINB12, FOLATE, FERRITIN, TIBC, IRON, RETICCTPCT in the last 72 hours. Sepsis Labs: Recent Labs  Lab 07/19/17 1850 07/19/17 2136 07/20/17 1137 07/20/17 1519 07/21/17 0932  PROCALCITON  --   --   --   --  <0.10  LATICACIDVEN 2.73* 2.28* 2.8* 2.9*  --     Recent Results (from the past 240 hour(s))  Culture, sputum-assessment     Status:  None   Collection Time: 07/20/17  9:58 AM  Result Value Ref Range Status   Specimen Description EXPECTORATED SPUTUM  Final   Special Requests Normal  Final   Sputum evaluation   Final    Sputum specimen not acceptable for testing.  Please recollect.   INFORMED JESSICA, RN @1013  ON 6.11.19 BY [facility] Performed at [facility], [address] [place], [place]    Report Status 07/20/2017 FINAL  Final  Culture, blood (routine x 2)     Status: None (Preliminary result)   Collection Time: 07/20/17  5:13 PM  Result Value Ref Range Status   Specimen Description   Final    BLOOD LEFT HAND Performed at [facility], [address], [place], [place]    Special Requests   Final    BOTTLES DRAWN AEROBIC ONLY Blood Culture adequate volume Performed at [facility], [address] [place], [place]    Culture   Final    NO GROWTH 2 DAYS Performed at [facility], [address] [place], [place]    Report Status PENDING  Incomplete  Culture,  blood (routine x 2)     Status: None (Preliminary result)   Collection Time: 07/20/17  5:21 PM  Result Value Ref Range Status   Specimen Description   Final    BLOOD RIGHT FOREARM Performed at [facility], [address] [place], [place]    Special Requests   Final    BOTTLES DRAWN AEROBIC ONLY Blood Culture adequate volume Performed at [facility], [address], [place], [place]    Culture   Final    NO GROWTH 2 DAYS Performed at [facility], [address], [place], [place]    Report Status PENDING  Incomplete      Radiology Studies: No results found.    Scheduled Meds: . apixaban  10 mg Oral BID   Followed by  . [START ON 07/29/2017] apixaban  5 mg Oral BID  . aspirin EC  81 mg Oral Daily  . atorvastatin  40 mg Oral q1800  . calcium-vitamin D  1 tablet Oral Daily  . febuxostat  40 mg Oral Daily  . furosemide  40 mg Oral Daily  . guaiFENesin  1,200 mg Oral BID  . insulin aspart  0-15 Units Subcutaneous TID WC  . insulin aspart  0-5 Units Subcutaneous QHS  . ipratropium-albuterol  3 mL Nebulization Q6H  . loratadine  10 mg Oral Daily  . methylPREDNISolone (SOLU-MEDROL) injection  60 mg Intravenous Q12H  . mometasone-formoterol  2 puff Inhalation BID  . multivitamin with minerals  1 tablet Oral Daily  . nicotine  14 mg Transdermal Daily  . pantoprazole  40 mg Oral BID  . potassium chloride  40 mEq Oral Q4H  . [START ON 07/23/2017] predniSONE  50 mg Oral Q breakfast  . cyanocobalamin  2,500 mcg Oral Daily  . vitamin C  1,000 mg Oral Daily   Continuous Infusions: . albuterol 10 mg/hr (07/19/17 2046)     LOS: 3 days    Time spent: Total of 25 minutes spent with pt, greater than 50% of which was spent in discussion of  treatment, counseling and coordination of care   [phone], MD Pager: Text Page via [email or web address]   If 7PM-7AM, please contact night-coverage [email or web address] 07/22/2017, 4:28 PM    Note -  This record has been created using [employer]. Chart creation errors have been sought, but may not always have been located. Such creation errors do not reflect on the standard of medical care.

## 2017-07-22 NOTE — Consult Note (Signed)
   [facility] CM Inpatient Consult   07/22/2017  Chad Duke 1945/06/26 [id number]   [facility] Care Management follow up.   Spoke with Chad Duke at bedside to confirm Primary Care MD. Chad Duke confirms Dr. [name] is his Primary Care Provider at [facility]. Discussed that Dr. Wynelle Link is not listed as a [facility] Provider. Chad Duke reports he changed his Primary Care Provider last fall.   Unable to follow for [facility] Care Management at this time due not having a [facility] Provider.    Raiford Noble, MSN-Ed, RN,BSN [facility] Liaison [phone]

## 2017-07-22 NOTE — Care Management Important Message (Signed)
Important Message  Patient Details  Name: Haylen Shelnutt MRN: [id number] Date of Birth: 06/04/1945   Medicare Important Message Given:  Yes    Caren Macadam 07/22/2017, 12:06 PMImportant Message  Patient Details  Name: Kellan Raffield MRN: [id number] Date of Birth: 04/15/1945   Medicare Important Message Given:  Yes    Caren Macadam 07/22/2017, 12:06 PM

## 2017-07-22 NOTE — Progress Notes (Signed)
Pt. ready to be placed on BiPAP for h/s and given due aerosol tx. thru circuit, tolerated well, humidier refilled with s/w, made aware to notify if needed.

## 2017-07-23 DIAGNOSIS — J449 Chronic obstructive pulmonary disease, unspecified: Secondary | ICD-10-CM

## 2017-07-23 LAB — MAGNESIUM: Magnesium: 2 mg/dL (ref 1.7–2.4)

## 2017-07-23 LAB — CBC
HCT: 35.8 % — ABNORMAL LOW (ref 39.0–52.0)
Hemoglobin: 11.9 g/dL — ABNORMAL LOW (ref 13.0–17.0)
MCH: 33.6 pg (ref 26.0–34.0)
MCHC: 33.2 g/dL (ref 30.0–36.0)
MCV: 101.1 fL — ABNORMAL HIGH (ref 78.0–100.0)
Platelets: 116 10*3/uL — ABNORMAL LOW (ref 150–400)
RBC: 3.54 MIL/uL — ABNORMAL LOW (ref 4.22–5.81)
RDW: 17 % — ABNORMAL HIGH (ref 11.5–15.5)
WBC: 12 10*3/uL — ABNORMAL HIGH (ref 4.0–10.5)

## 2017-07-23 LAB — GLUCOSE, CAPILLARY
Glucose-Capillary: 188 mg/dL — ABNORMAL HIGH (ref 65–99)
Glucose-Capillary: 272 mg/dL — ABNORMAL HIGH (ref 65–99)

## 2017-07-23 LAB — BASIC METABOLIC PANEL
Anion gap: 11 (ref 5–15)
BUN: 57 mg/dL — ABNORMAL HIGH (ref 6–20)
CO2: 30 mmol/L (ref 22–32)
Calcium: 8.8 mg/dL — ABNORMAL LOW (ref 8.9–10.3)
Chloride: 100 mmol/L — ABNORMAL LOW (ref 101–111)
Creatinine, Ser: 1.59 mg/dL — ABNORMAL HIGH (ref 0.61–1.24)
GFR calc Af Amer: 48 mL/min — ABNORMAL LOW (ref >60)
GFR calc non Af Amer: 42 mL/min — ABNORMAL LOW (ref >60)
Glucose, Bld: 223 mg/dL — ABNORMAL HIGH (ref 65–99)
Potassium: 3.5 mmol/L (ref 3.5–5.1)
Sodium: 141 mmol/L (ref 135–145)

## 2017-07-23 MED ORDER — FUROSEMIDE 20 MG PO TABS: 20.0000 mg | ORAL_TABLET | ORAL | 0 refills | Status: DC

## 2017-07-23 MED ORDER — NICOTINE 14 MG/24HR TD PT24: 14.0000 mg | MEDICATED_PATCH | Freq: Every day | TRANSDERMAL | 0 refills | Status: DC

## 2017-07-23 MED ORDER — ELIQUIS 5 MG VTE STARTER PACK: ORAL_TABLET | ORAL | 0 refills | Status: DC

## 2017-07-23 MED ORDER — MOMETASONE FURO-FORMOTEROL FUM 200-5 MCG/ACT IN AERO: 2.0000 | INHALATION_SPRAY | Freq: Two times a day (BID) | RESPIRATORY_TRACT | 0 refills | Status: DC

## 2017-07-23 MED ORDER — IPRATROPIUM-ALBUTEROL 0.5-2.5 (3) MG/3ML IN SOLN
3.0000 mL | RESPIRATORY_TRACT | Status: DC | PRN
  Administered 2017-07-23: 3 mL via RESPIRATORY_TRACT
  Filled 2017-07-23: qty 3

## 2017-07-23 MED ORDER — PREDNISONE 10 MG PO TABS: ORAL_TABLET | ORAL | 0 refills | Status: DC

## 2017-07-23 MED ORDER — APIXABAN 5 MG PO TABS: ORAL_TABLET | ORAL | 0 refills | Status: DC

## 2017-07-23 MED ORDER — METFORMIN HCL 500 MG PO TABS: 500.0000 mg | ORAL_TABLET | Freq: Two times a day (BID) | ORAL | 0 refills | Status: DC

## 2017-07-23 NOTE — Progress Notes (Signed)
Pt stated that the doctor told him he could have his Tx. Just Prn. Pt also stated that he didn't want scheduled Tx. To RT. RT changed the pt to q4 Prn. Pt has inhaler at bedside

## 2017-07-23 NOTE — Progress Notes (Signed)
Pt's and son selected [facility] home referral was given to in house rep with [facility].

## 2017-07-23 NOTE — Discharge Summary (Signed)
Physician Discharge Summary  Chad Duke  ZOX:[id number]  DOB: 1945-08-15  DOA: 07/19/2017 PCP: [name], MD  Admit date: 07/19/2017 Discharge date: 07/23/2017  Admitted From: Home  Disposition: Home   Recommendations for Outpatient Follow-up:  1. Follow up with PCP in 1 week  2. Please obtain BMP/CBC in one week monitor renal function and hemoglobin 3. Follow-up with pulmonologist for steroid taper in 1 to 2 weeks  Home Health: PT/OT/Aide/SW Equipment/Devices: Oxygen 2 L nasal cannula  Discharge Condition: Stable CODE STATUS: Full code Diet recommendation: Heart Healthy/ Carb Modified   Brief/Interim Summary: For full details see H&P/Progress note, but in brief, Chad Duke is a 72 year old male with medical history of COPD, hypertension, CKD and OSA presented to the emergency department for evaluation of shortness of breath and lower extremity edema. Upon ED evaluation was found to be hypoxic, with chest x-ray concerning of community-acquired pneumonia and patient was admitted with working diagnosis of acute COPD exacerbation. Lab work-up revealed elevated d-dimer. CTA was performed which showed left lower lobe pulmonary emboli, lower extremity Dopplers show bilateral DVT.  Subjective: Patient seen and examined, he breathing continues to improve.  Was unable to wean off oxygen.  Denies chest pain, palpitations.  Remains afebrile.  Cough has improved.  Discharge Diagnoses/Hospital Course:  Acute respiratory failure with hypoxia secondary to COPD and pulmonary embolism CTA showed left lower lobe pulmonary emboli, lower extremity Doppler shows bilateral DVT Treat underlying causes Pulse ox while ambulating was checked, saturation dropped to 85% while on room air.  Improves with 2 L of oxygen.  Will discharge patient on oxygen 2 L with ambulation, and 1 L or room air at rest.  COPD exacerbation Procalcitonin negative, discontinue azithromycin and Rocephin Initially treated  with Solu-Medrol taper will transition to prednisone will taper down to 30 mg and continue daily until seen by pulmonologist.  Continue  home inhalers. O2 supplementation to keep saturation between 91 and 95.  Follow-up with pulmonologist in 1 to 2 weeks  Acute pulmonary embolism with bilateral DVT Initially treated with heparin drip, transitioned to Eliquis Unprovoked, possible sedentary life  May need anticoagulation for life. Follow-up with PCP  Lower extremity edema bilaterally Multifactorial, chronic due to venous insufficiency, DVT and diastolic dysfunction Unlikely CHF, normal BNP, echocardiogram with EF of 65 to 70% and grade 1 diastolic dysfunction Keep legs elevated, continue compression dressing with ACE bandage.   Chronic diastolic heart failure Chest x-ray with no signs of fluid overload, normal BNP  IV Lasix were held due to increasing creatinine Will discharge on Lasix every other day, to help with lower extremity edema Low salt diet   Essential hypertension BP stable during hospital stay.  Maxzide on hold, patient will get Lasix every other day for now.  Not on antihypertensive medications during hospital stay   CAP ruled out Procalcitonin negative, CT chest and chest x-ray with no signs of infection Discontinue all antibiotics    New diagnosis of diabetes mellitus type 2 with hyperglycemia Patient on chronic steroids for COPD, A1c 7.5 Continue SSI, start low-dose metformin, may be related to steroid use, check A1c in 3 months when hopefully steroids has been taper off.  Follow-up with PCP  AKI  Slight bump in creatinine, suspected from Lasix Check renal function in 1 week  Hypokalemia -resolved Repleted  Check BMP and magnesium in 1 week  All other chronic medical condition were stable during the hospitalization.  On the day of the discharge the patient's vitals were stable, and  no other acute medical condition were reported by patient. the patient was  felt safe to be discharge to home.   Discharge Instructions  You were cared for by a hospitalist during your hospital stay. If you have any questions about your discharge medications or the care you received while you were in the hospital after you are discharged, you can call the unit and asked to speak with the hospitalist on call if the hospitalist that took care of you is not available. Once you are discharged, your primary care physician will handle any further medical issues. Please note that NO REFILLS for any discharge medications will be authorized once you are discharged, as it is imperative that you return to your primary care physician (or establish a relationship with a primary care physician if you do not have one) for your aftercare needs so that they can reassess your need for medications and monitor your lab values.  Discharge Instructions    Call MD for:  difficulty breathing, headache or visual disturbances   Complete by:  As directed    Call MD for:  extreme fatigue   Complete by:  As directed    Call MD for:  hives   Complete by:  As directed    Call MD for:  persistant dizziness or light-headedness   Complete by:  As directed    Call MD for:  persistant nausea and vomiting   Complete by:  As directed    Call MD for:  redness, tenderness, or signs of infection (pain, swelling, redness, odor or green/yellow discharge around incision site)   Complete by:  As directed    Call MD for:  severe uncontrolled pain   Complete by:  As directed    Call MD for:  temperature >100.4   Complete by:  As directed    Diet - low sodium heart healthy   Complete by:  As directed    Increase activity slowly   Complete by:  As directed      Allergies as of 07/23/2017      Reactions   Chantix [varenicline] Other (See Comments)   Suicidal thoughts      Medication List    STOP taking these medications   budesonide-formoterol 160-4.5 MCG/ACT inhaler Commonly known as:   SYMBICORT Replaced by:  mometasone-formoterol 200-5 MCG/ACT Aero   triamterene-hydrochlorothiazide 37.5-25 MG tablet Commonly known as:  MAXZIDE-25     TAKE these medications   albuterol 108 (90 Base) MCG/ACT inhaler Commonly known as:  PROVENTIL HFA;VENTOLIN HFA Inhale 2 puffs into the lungs every 4 (four) hours as needed for wheezing or shortness of breath.   aspirin EC 81 MG tablet Take 81 mg by mouth daily.   atorvastatin 40 MG tablet Commonly known as:  LIPITOR Take 1 tablet (40 mg total) by mouth daily.   cetirizine 10 MG tablet Commonly known as:  ZYRTEC Take 10 mg by mouth daily.   CITRACAL CALCIUM+D 600-40-500 MG-MG-UNIT Tb24 Generic drug:  Calcium-Magnesium-Vitamin D Take 1 tablet by mouth daily.   cyanocobalamin 2000 MCG tablet Take 2,500 mcg by mouth daily.   ELIQUIS STARTER PACK 5 MG Tabs Take as directed on package: start with two-5mg  tablets twice daily for 7 days. On day 8, switch to one-5mg  tablet twice daily.   febuxostat 40 MG tablet Commonly known as:  ULORIC Take 40 mg by mouth daily.   FLUTTER Devi Use as directed   furosemide 20 MG tablet Commonly known as:  LASIX Take 1  tablet (20 mg total) by mouth every other day. Start taking on:  07/25/2017   guaiFENesin 600 MG 12 hr tablet Commonly known as:  MUCINEX Take 1,200 mg by mouth 2 (two) times daily.   ipratropium-albuterol 0.5-2.5 (3) MG/3ML Soln Commonly known as:  DUONEB Take 3 mLs by nebulization every 6 (six) hours. What changed:  Another medication with the same name was removed. Continue taking this medication, and follow the directions you see here.   metFORMIN 500 MG tablet Commonly known as:  GLUCOPHAGE Take 1 tablet (500 mg total) by mouth 2 (two) times daily with a meal.   methotrexate 2.5 MG tablet Commonly known as:  RHEUMATREX Take 10 mg by mouth once a week. 4 tablets weekly (Mon)   mometasone-formoterol 200-5 MCG/ACT Aero Commonly known as:  DULERA Inhale 2 puffs  into the lungs 2 (two) times daily. Replaces:  budesonide-formoterol 160-4.5 MCG/ACT inhaler   multivitamin tablet Take 1 tablet by mouth daily.   nicotine 14 mg/24hr patch Commonly known as:  NICODERM CQ - dosed in mg/24 hours Place 1 patch (14 mg total) onto the skin daily. Start taking on:  07/24/2017   OXYGEN BIPAP with 1lpm o2   pantoprazole 40 MG tablet Commonly known as:  PROTONIX Take 40 mg by mouth 2 (two) times daily.   predniSONE 10 MG tablet Commonly known as:  DELTASONE Take 5 tablets for 4 days; Take 4 tablets for 3 days; then take 3 tablets daily. What changed:    medication strength  additional instructions   REMICADE IV Inject into the vein every 6 (six) weeks.   Tiotropium Bromide Monohydrate 2.5 MCG/ACT Aers Commonly known as:  SPIRIVA RESPIMAT Inhale 2 puffs into the lungs daily.   vitamin C 1000 MG tablet Take 1,000 mg by mouth daily.            Durable Medical Equipment  (From admission, onward)        Start     Ordered   07/23/17 1413  DME Oxygen  Once    Question Answer Comment  Mode or (Route) Nasal cannula   Liters per Minute 2   Frequency Continuous (stationary and portable oxygen unit needed)   Oxygen conserving device Yes   Oxygen delivery system Gas      07/23/17 1412     Follow-up Information    [name], MD. Schedule an appointment as soon as possible for a visit in 1 week(s).   Specialty:  Internal Medicine Why:  Hospital follow up  Contact information: [address] [place] [address] [phone]          Allergies  Allergen Reactions  . Chantix [Varenicline] Other (See Comments)    Suicidal thoughts    Consultations:  None   Procedures/Studies: Dg Chest 2 View  Result Date: 07/19/2017 CLINICAL DATA:  Shortness of breath EXAM: CHEST - 2 VIEW COMPARISON:  03/04/2017 chest radiograph 04/05/2017 chest CT FINDINGS: The heart size and mediastinal contours are within normal limits. Both lungs  are clear. The visualized skeletal structures are unremarkable. IMPRESSION: No active cardiopulmonary disease. Electronically Signed   By: Deatra Robinson M.D.   On: 07/19/2017 17:44   Ct Angio Chest Pe W Or Wo Contrast  Result Date: 07/20/2017 CLINICAL DATA:  Shortness of breath and lower extremity edema for several days EXAM: CT ANGIOGRAPHY CHEST WITH CONTRAST TECHNIQUE: Multidetector CT imaging of the chest was performed using the standard protocol during bolus administration of intravenous contrast. Multiplanar CT image reconstructions  and MIPs were obtained to evaluate the vascular anatomy. Examination limited by scattered respiratory motion artifacts. CONTRAST:  ISOVUE-370 IOPAMIDOL (ISOVUE-370) INJECTION 76% IV COMPARISON:  04/05/2017 noncontrast CT chest FINDINGS: Cardiovascular: Aorta normal caliber without aneurysm or dissection. Atherosclerotic calcifications aorta, coronary arteries and proximal great vessels. Pulmonary arteries adequately opacified. Filling defects identified within LEFT lower lobe pulmonary arteries consistent with pulmonary embolism. No additional pulmonary emboli identified. RV/LV ratio = 0.89. No pericardial effusion. Mediastinum/Nodes: Esophagus unremarkable. Base of cervical region normal appearance. No adenopathy. Lungs/Pleura: Respiratory motion artifacts. Central peribronchial thickening. No pulmonary infiltrate, pleural effusion or pneumothorax. Minimal subsegmental atelectasis in the upper lobes and lingula. Upper Abdomen: Stomach decompressed, wall appears thickened though this could be an artifact from underdistention. Nonobstructing calculi at upper poles of both kidneys. Small LEFT renal cyst. Musculoskeletal: Bones demineralized.  LEFT shoulder prosthesis. Review of the MIP images confirms the above findings. IMPRESSION: LEFT lower lobe pulmonary emboli. Upper normal RV/LV ratio of 0.89. Nonobstructing BILATERAL renal calculi. Aortic Atherosclerosis  (ICD10-I70.0). Critical Value/emergent results were called by telephone at the time of interpretation on 07/20/2017 at 1508 hrs to Dr. Marguerita Merles, who verbally acknowledged these results. Electronically Signed   By: Ulyses Southward M.D.   On: 07/20/2017 15:08    Discharge Exam: Vitals:   07/23/17 0827 07/23/17 1321  BP:    Pulse:  100  Resp:  18  Temp:    SpO2: 94% 97%   Vitals:   07/23/17 0451 07/23/17 0827 07/23/17 0928 07/23/17 1321  BP: (!) 144/97     Pulse: 89   100  Resp: 20   18  Temp: 97.7 F (36.5 C)     TempSrc: Oral     SpO2: 97% 94%  97%  Weight:   117.6 kg (259 lb 4.8 oz)   Height:        General: Pt is alert, awake, not in acute distress Cardiovascular: RRR, S1/S2 +, no rubs, no gallops Respiratory: BS diminished, however sig improved. No wheezing or crackles  Abdominal: Soft, NT, ND, bowel sounds + Extremities: LE edema 2+ improved   The results of significant diagnostics from this hospitalization (including imaging, microbiology, ancillary and laboratory) are listed below for reference.     Microbiology: Recent Results (from the past 240 hour(s))  Culture, sputum-assessment     Status: None   Collection Time: 07/20/17  9:58 AM  Result Value Ref Range Status   Specimen Description EXPECTORATED SPUTUM  Final   Special Requests Normal  Final   Sputum evaluation   Final    Sputum specimen not acceptable for testing.  Please recollect.   INFORMED JESSICA, RN @1013  ON 6.11.19 BY [facility] Performed at [facility], [address], [place], [place] [address]    Report Status 07/20/2017 FINAL  Final  Culture, blood (routine x 2)     Status: None (Preliminary result)   Collection Time: 07/20/17  5:13 PM  Result Value Ref Range Status   Specimen Description BLOOD LEFT HAND  Final   Special Requests   Final    BOTTLES DRAWN AEROBIC ONLY Blood Culture adequate volume Performed at [facility], [address] [place],  [place] [address]    Culture   Final    NO GROWTH 2 DAYS Performed at [facility], [address], [place], [place] [address]    Report Status PENDING  Incomplete  Culture, blood (routine x 2)     Status: None (Preliminary result)  Collection Time: 07/20/17  5:21 PM  Result Value Ref Range Status   Specimen Description   Final    BLOOD RIGHT FOREARM Performed at [facility], [address], [place], [place] [address]    Special Requests   Final    BOTTLES DRAWN AEROBIC ONLY Blood Culture adequate volume Performed at [facility], [address], [place], [place] [address]    Culture   Final    NO GROWTH 2 DAYS Performed at [facility], [address], [place], [place] [address]    Report Status PENDING  Incomplete     Labs: BNP (last 3 results) Recent Labs    07/19/17 1842  BNP 45.4   Basic Metabolic Panel: Recent Labs  Lab 07/19/17 1841 07/20/17 0303 07/21/17 0155 07/22/17 0411 07/23/17 0427  NA 142 138 139 140 141  K 4.1 4.3 3.5 3.3* 3.5  CL 103 104 99* 100* 100*  CO2 27 24 29 29 30   GLUCOSE 139* 329* 199* 188* 223*  BUN 34* 33* 43* 56* 57*  CREATININE 1.28* 1.32*  1.32* 1.65* 1.49* 1.59*  CALCIUM 9.5 8.8* 9.2 9.1 8.8*  MG  --  1.5* 2.1 2.0 2.0  PHOS  --  3.6 3.6  --   --    Liver Function Tests: Recent Labs  Lab 07/19/17 1841 07/21/17 0155  AST 32 26  ALT 44 44  ALKPHOS 49 46  BILITOT 0.7 0.5  PROT 6.4* 6.4*  ALBUMIN 3.3* 3.2*   No results for input(s): LIPASE, AMYLASE in the last 168 hours. No results for input(s): AMMONIA in the last 168 hours. CBC: Recent Labs  Lab 07/19/17 1841 07/20/17 0303 07/21/17 0155 07/22/17 0411 07/23/17 0427  WBC 11.7* 8.8 13.5* 14.4* 12.0*  NEUTROABS 9.2*  --  12.0*  --   --   HGB 13.4 12.5* 12.7* 11.8* 11.9*  HCT 40.8 38.8* 38.4* 36.1* 35.8*  MCV 102.8* 103.5* 101.1* 101.4* 101.1*  PLT 122* 101* 114* 122* 116*   Cardiac Enzymes: No results for input(s): CKTOTAL, CKMB,  CKMBINDEX, TROPONINI in the last 168 hours. BNP: Invalid input(s): POCBNP CBG: Recent Labs  Lab 07/22/17 1126 07/22/17 1630 07/22/17 2233 07/23/17 0815 07/23/17 1154  GLUCAP 304* 181* 268* 188* 272*   D-Dimer No results for input(s): DDIMER in the last 72 hours. Hgb A1c Recent Labs    07/21/17 0155 07/22/17 0411  HGBA1C 7.2* 7.5*   Lipid Profile Recent Labs    07/21/17 0155  CHOL 183  HDL 109  LDLCALC 65  TRIG 47  CHOLHDL 1.7   Thyroid function studies Recent Labs    07/21/17 0155  TSH 0.297*   Anemia work up No results for input(s): VITAMINB12, FOLATE, FERRITIN, TIBC, IRON, RETICCTPCT in the last 72 hours. Urinalysis    Component Value Date/Time   COLORURINE YELLOW 07/19/2017 2000   APPEARANCEUR CLEAR 07/19/2017 2000   LABSPEC 1.017 07/19/2017 2000   PHURINE 5.0 07/19/2017 2000   GLUCOSEU NEGATIVE 07/19/2017 2000   HGBUR LARGE (A) 07/19/2017 2000   BILIRUBINUR NEGATIVE 07/19/2017 2000   KETONESUR NEGATIVE 07/19/2017 2000   PROTEINUR NEGATIVE 07/19/2017 2000   UROBILINOGEN 0.2 03/28/2007 1330   NITRITE NEGATIVE 07/19/2017 2000   LEUKOCYTESUR NEGATIVE 07/19/2017 2000   Sepsis Labs Invalid input(s): PROCALCITONIN,  WBC,  LACTICIDVEN Microbiology Recent Results (from the past 240 hour(s))  Culture, sputum-assessment     Status: None   Collection Time: 07/20/17  9:58 AM  Result Value Ref Range  Status   Specimen Description EXPECTORATED SPUTUM  Final   Special Requests Normal  Final   Sputum evaluation   Final    Sputum specimen not acceptable for testing.  Please recollect.   INFORMED JESSICA, RN @1013  ON 6.11.19 BY [facility] Performed at [facility], [address], [place], [place]    Report Status 07/20/2017 FINAL  Final  Culture, blood (routine x 2)     Status: None (Preliminary result)   Collection Time: 07/20/17  5:13 PM  Result Value Ref Range Status   Specimen Description BLOOD LEFT HAND  Final   Special  Requests   Final    BOTTLES DRAWN AEROBIC ONLY Blood Culture adequate volume Performed at [facility], [address], [place], [place]    Culture   Final    NO GROWTH 2 DAYS Performed at [facility], [address], [place], [place]    Report Status PENDING  Incomplete  Culture, blood (routine x 2)     Status: None (Preliminary result)   Collection Time: 07/20/17  5:21 PM  Result Value Ref Range Status   Specimen Description   Final    BLOOD RIGHT FOREARM Performed at [facility], [address] [place], [place]    Special Requests   Final    BOTTLES DRAWN AEROBIC ONLY Blood Culture adequate volume Performed at [facility], [address] [place], [place]    Culture   Final    NO GROWTH 2 DAYS Performed at [facility], [address] [place], [place]    Report Status PENDING  Incomplete    Time coordinating discharge: 35 minutes  SIGNED:  [id number], MD  [facility] 07/23/2017, 2:14 PM  Pager please text page via  [email or web address]  Note - This record has been created using 07/25/2017. Chart creation errors have been sought, but may not always have been located. Such creation errors do not reflect on the standard of medical care.

## 2017-07-23 NOTE — Progress Notes (Signed)
Pt's O2 is from [employer]. [employer] in house rep was called. Medicare Home Health Agency list given to pt and son at beside.

## 2017-07-23 NOTE — Progress Notes (Addendum)
SATURATION QUALIFICATIONS: (This note is used to comply with regulatory documentation for home oxygen)  Patient Saturations on Room Air at Rest = 96%  Patient Saturations on Room Air while Ambulating = 85%  Patient Saturations on 2 Liters of oxygen while Ambulating = 94%  Patient still felt SOB requested O2 be turned to 3L for comfort  Patient Saturations on 3 Liters of oxygen while Ambulating = 97%  Please briefly explain why patient needs home oxygen: previous methods attempted and unsuccessful

## 2017-07-25 DIAGNOSIS — S81811D Laceration without foreign body, right lower leg, subsequent encounter: Secondary | ICD-10-CM | POA: Diagnosis not present

## 2017-07-25 DIAGNOSIS — I130 Hypertensive heart and chronic kidney disease with heart failure and stage 1 through stage 4 chronic kidney disease, or unspecified chronic kidney disease: Secondary | ICD-10-CM | POA: Diagnosis not present

## 2017-07-25 DIAGNOSIS — E1122 Type 2 diabetes mellitus with diabetic chronic kidney disease: Secondary | ICD-10-CM | POA: Diagnosis not present

## 2017-07-25 DIAGNOSIS — I5031 Acute diastolic (congestive) heart failure: Secondary | ICD-10-CM | POA: Diagnosis not present

## 2017-07-25 DIAGNOSIS — S81812D Laceration without foreign body, left lower leg, subsequent encounter: Secondary | ICD-10-CM | POA: Diagnosis not present

## 2017-07-25 DIAGNOSIS — J439 Emphysema, unspecified: Secondary | ICD-10-CM | POA: Diagnosis not present

## 2017-07-25 LAB — CULTURE, BLOOD (ROUTINE X 2)
Culture: NO GROWTH
Culture: NO GROWTH
Special Requests: ADEQUATE
Special Requests: ADEQUATE

## 2017-07-26 ENCOUNTER — Other Ambulatory Visit: Payer: Self-pay

## 2017-07-26 NOTE — Patient Outreach (Signed)
[facility]) Care Management  07/26/2017  Chad Duke 1945/12/10 [id number]   EMMI- General Discharge RED ON EMMI ALERT Day # 1 Date: 07/25/17 Red Alert Reason:  Scheduled follow up? no  Outreach attempt # 1 Telephone call to patient.  Spoke with patient.  He is able to verify HIPAA.  Patient reports that he is doing ok since being home and that he is trying build his strength back up.  Patient verifies that Dr. [name] is his primary care physician and that he does have an appointment with him next week.    Plan: RN CM will close case as patient is not eligible for services as he does not have a [facility] primary physician.  Bary Leriche, RN, MSN [facility] Care Management Care Management Coordinator Direct Line [phone] Toll Free: [phone]  Fax: [phone]

## 2017-07-26 NOTE — Patient Outreach (Signed)
Patient triggered Red on [facility] Discharge Dashboard, notification sent to Dionne Leath, RN 

## 2017-07-27 ENCOUNTER — Ambulatory Visit: Payer: Medicare Other | Admitting: Family

## 2017-07-27 ENCOUNTER — Other Ambulatory Visit (HOSPITAL_COMMUNITY): Payer: Medicare Other

## 2017-07-27 DIAGNOSIS — S81811D Laceration without foreign body, right lower leg, subsequent encounter: Secondary | ICD-10-CM | POA: Diagnosis not present

## 2017-07-27 DIAGNOSIS — E1122 Type 2 diabetes mellitus with diabetic chronic kidney disease: Secondary | ICD-10-CM | POA: Diagnosis not present

## 2017-07-27 DIAGNOSIS — J439 Emphysema, unspecified: Secondary | ICD-10-CM | POA: Diagnosis not present

## 2017-07-27 DIAGNOSIS — S81812D Laceration without foreign body, left lower leg, subsequent encounter: Secondary | ICD-10-CM | POA: Diagnosis not present

## 2017-07-27 DIAGNOSIS — I5031 Acute diastolic (congestive) heart failure: Secondary | ICD-10-CM | POA: Diagnosis not present

## 2017-07-27 DIAGNOSIS — I130 Hypertensive heart and chronic kidney disease with heart failure and stage 1 through stage 4 chronic kidney disease, or unspecified chronic kidney disease: Secondary | ICD-10-CM | POA: Diagnosis not present

## 2017-07-28 DIAGNOSIS — J439 Emphysema, unspecified: Secondary | ICD-10-CM | POA: Diagnosis not present

## 2017-07-28 DIAGNOSIS — I5031 Acute diastolic (congestive) heart failure: Secondary | ICD-10-CM | POA: Diagnosis not present

## 2017-07-28 DIAGNOSIS — I130 Hypertensive heart and chronic kidney disease with heart failure and stage 1 through stage 4 chronic kidney disease, or unspecified chronic kidney disease: Secondary | ICD-10-CM | POA: Diagnosis not present

## 2017-07-28 DIAGNOSIS — E1122 Type 2 diabetes mellitus with diabetic chronic kidney disease: Secondary | ICD-10-CM | POA: Diagnosis not present

## 2017-07-28 DIAGNOSIS — S81811D Laceration without foreign body, right lower leg, subsequent encounter: Secondary | ICD-10-CM | POA: Diagnosis not present

## 2017-07-28 DIAGNOSIS — S81812D Laceration without foreign body, left lower leg, subsequent encounter: Secondary | ICD-10-CM | POA: Diagnosis not present

## 2017-07-29 DIAGNOSIS — I5031 Acute diastolic (congestive) heart failure: Secondary | ICD-10-CM | POA: Diagnosis not present

## 2017-07-29 DIAGNOSIS — J439 Emphysema, unspecified: Secondary | ICD-10-CM | POA: Diagnosis not present

## 2017-07-29 DIAGNOSIS — E1122 Type 2 diabetes mellitus with diabetic chronic kidney disease: Secondary | ICD-10-CM | POA: Diagnosis not present

## 2017-07-29 DIAGNOSIS — I130 Hypertensive heart and chronic kidney disease with heart failure and stage 1 through stage 4 chronic kidney disease, or unspecified chronic kidney disease: Secondary | ICD-10-CM | POA: Diagnosis not present

## 2017-07-29 DIAGNOSIS — S81811D Laceration without foreign body, right lower leg, subsequent encounter: Secondary | ICD-10-CM | POA: Diagnosis not present

## 2017-07-29 DIAGNOSIS — S81812D Laceration without foreign body, left lower leg, subsequent encounter: Secondary | ICD-10-CM | POA: Diagnosis not present

## 2017-07-30 ENCOUNTER — Other Ambulatory Visit: Payer: Self-pay | Admitting: Internal Medicine

## 2017-07-30 DIAGNOSIS — I5031 Acute diastolic (congestive) heart failure: Secondary | ICD-10-CM | POA: Diagnosis not present

## 2017-07-30 DIAGNOSIS — E1122 Type 2 diabetes mellitus with diabetic chronic kidney disease: Secondary | ICD-10-CM | POA: Diagnosis not present

## 2017-07-30 DIAGNOSIS — S81812D Laceration without foreign body, left lower leg, subsequent encounter: Secondary | ICD-10-CM | POA: Diagnosis not present

## 2017-07-30 DIAGNOSIS — I130 Hypertensive heart and chronic kidney disease with heart failure and stage 1 through stage 4 chronic kidney disease, or unspecified chronic kidney disease: Secondary | ICD-10-CM | POA: Diagnosis not present

## 2017-07-30 DIAGNOSIS — S81811D Laceration without foreign body, right lower leg, subsequent encounter: Secondary | ICD-10-CM | POA: Diagnosis not present

## 2017-07-30 DIAGNOSIS — J439 Emphysema, unspecified: Secondary | ICD-10-CM | POA: Diagnosis not present

## 2017-07-30 MED ORDER — ALBUTEROL SULFATE HFA 108 (90 BASE) MCG/ACT IN AERS: 2.0000 | INHALATION_SPRAY | RESPIRATORY_TRACT | 1 refills | Status: AC | PRN

## 2017-08-01 ENCOUNTER — Encounter (HOSPITAL_COMMUNITY): Payer: Self-pay | Admitting: Emergency Medicine

## 2017-08-01 ENCOUNTER — Emergency Department (HOSPITAL_COMMUNITY)
Admission: EM | Admit: 2017-08-01 | Discharge: 2017-08-01 | Disposition: A | Discharge status: Home / Self Care | Payer: Medicare Other | Attending: Emergency Medicine | Admitting: Emergency Medicine

## 2017-08-01 DIAGNOSIS — Z86718 Personal history of other venous thrombosis and embolism: Secondary | ICD-10-CM | POA: Insufficient documentation

## 2017-08-01 DIAGNOSIS — Z23 Encounter for immunization: Secondary | ICD-10-CM | POA: Insufficient documentation

## 2017-08-01 DIAGNOSIS — J449 Chronic obstructive pulmonary disease, unspecified: Secondary | ICD-10-CM | POA: Diagnosis not present

## 2017-08-01 DIAGNOSIS — Y93G1 Activity, food preparation and clean up: Secondary | ICD-10-CM | POA: Diagnosis not present

## 2017-08-01 DIAGNOSIS — W260XXA Contact with knife, initial encounter: Secondary | ICD-10-CM | POA: Diagnosis not present

## 2017-08-01 DIAGNOSIS — Y929 Unspecified place or not applicable: Secondary | ICD-10-CM | POA: Insufficient documentation

## 2017-08-01 DIAGNOSIS — F17210 Nicotine dependence, cigarettes, uncomplicated: Secondary | ICD-10-CM | POA: Diagnosis not present

## 2017-08-01 DIAGNOSIS — Z79899 Other long term (current) drug therapy: Secondary | ICD-10-CM | POA: Insufficient documentation

## 2017-08-01 DIAGNOSIS — Z86711 Personal history of pulmonary embolism: Secondary | ICD-10-CM | POA: Insufficient documentation

## 2017-08-01 DIAGNOSIS — Z7901 Long term (current) use of anticoagulants: Secondary | ICD-10-CM | POA: Diagnosis not present

## 2017-08-01 DIAGNOSIS — Y999 Unspecified external cause status: Secondary | ICD-10-CM | POA: Insufficient documentation

## 2017-08-01 DIAGNOSIS — I10 Essential (primary) hypertension: Secondary | ICD-10-CM | POA: Insufficient documentation

## 2017-08-01 DIAGNOSIS — S61211A Laceration without foreign body of left index finger without damage to nail, initial encounter: Secondary | ICD-10-CM | POA: Diagnosis not present

## 2017-08-01 MED ORDER — LIDOCAINE HCL (PF) 1 % IJ SOLN
10.0000 mL | Freq: Once | INTRAMUSCULAR | Status: AC
  Administered 2017-08-01: 10 mL
  Filled 2017-08-01: qty 30

## 2017-08-01 MED ORDER — TETANUS-DIPHTH-ACELL PERTUSSIS 5-2.5-18.5 LF-MCG/0.5 IM SUSP
0.5000 mL | Freq: Once | INTRAMUSCULAR | Status: AC
  Administered 2017-08-01: 0.5 mL via INTRAMUSCULAR
  Filled 2017-08-01: qty 0.5

## 2017-08-01 NOTE — ED Provider Notes (Signed)
[facility] Provider Note   CSN: [id number] Arrival date & time: 08/01/17  1403     History   Chief Complaint Chief Complaint  Patient presents with  . Laceration    HPI Chad Duke is a 72 y.o. male.  Chad Duke is a 72 y.o. Male with a history of AAA, hypertension, hyperlipidemia, GERD, COPD, CKD, and PE currently on Eliquis, who presents to the emergency department for evaluation of small laceration to the left index finger after cutting it with a knife today.  Patient reports he was trying to cut an onion and the knife slipped causing a small laceration to the medial aspect of the fingertip, he reports he is tried holding pressure on it with paper towels at home but has not been able to control the bleeding.  Bleeding has been constant since the cut.  He denies any numbness or tingling in the finger is able to bend it without difficulty.  No lightheadedness. Gauze and Coban applied in triage.  Patient is unsure when his last tetanus vaccine was.      Past Medical History:  Diagnosis Date  . AAA (abdominal aortic aneurysm) (HCC)   . Allergy    Rhinitis  . Arthritis    Rhuematoid  . Asthma   . Chronic renal insufficiency   . Diverticulosis   . Emphysema   . GERD (gastroesophageal reflux disease)   . Hearing loss of both ears   . Hiatal hernia   . Hyperlipidemia   . Hypertension   . Male hypogonadism   . Nephrolithiasis   . Nephrolithiasis   . Sleep apnea    On CPAP  . Stasis dermatitis   . Varicose veins   . Vitamin B 12 deficiency     Patient Active Problem List   Diagnosis Date Noted  . Deep vein thrombosis (DVT) of both lower extremities (HCC)   . Hyperglycemia   . Pulmonary embolus (HCC) 07/20/2017  . Acute diastolic CHF (congestive heart failure) (HCC) 07/20/2017  . Bilateral leg edema 07/20/2017  . Acute exacerbation of chronic obstructive pulmonary disease (COPD) (HCC) 07/19/2017  . Right middle lobe syndrome  09/08/2016  . Cigarette smoker 09/08/2016  . Morbid obesity due to excess calories (HCC) 09/08/2016  . Pain in joint, ankle and foot 03/31/2012  . Abnormality of gait 03/31/2012  . Rheumatoid arthritis(714.0) 03/31/2012  . COPD GOLD III  02/12/2011  . Sleep apnea 02/12/2011    Past Surgical History:  Procedure Laterality Date  . CARPAL TUNNEL RELEASE     bilateral  . FRACTURE SURGERY Left    Fibula  . JOINT REPLACEMENT Left    Shoulder  . JOINT REPLACEMENT Left    Hip   . KIDNEY STONE SURGERY  2003  . left hip replacement    . left knee    . left shoulder    . LITHOTRIPSY          Home Medications    Prior to Admission medications   Medication Sig Start Date End Date Taking? Authorizing Provider  albuterol (PROVENTIL HFA;VENTOLIN HFA) 108 (90 Base) MCG/ACT inhaler Inhale 2 puffs into the lungs every 4 (four) hours as needed for wheezing or shortness of breath. 07/30/17  Yes Nyoka Cowden, MD  apixaban (ELIQUIS) 5 MG TABS tablet Take two-5mg  tablets (total 10mg ) twice daily for 7 days.  On day #8, starting taking one-5mg  tablet twice daily Patient taking differently: Take 5 mg by mouth 2 (two) times daily.  07/23/17  Yes Randel Pigg, Dorma Russell, MD  Ascorbic Acid (VITAMIN C) 1000 MG tablet Take 1,000 mg by mouth daily.   Yes [provider]  aspirin EC 81 MG tablet Take 81 mg by mouth daily.   Yes [provider]  atorvastatin (LIPITOR) 40 MG tablet Take 1 tablet (40 mg total) by mouth daily. 05/18/17 08/16/17 Yes Lewayne Bunting, MD  Calcium-Magnesium-Vitamin D (CITRACAL CALCIUM+D) 600-40-500 MG-MG-UNIT TB24 Take 1 tablet by mouth daily.     Yes [provider]  cetirizine (ZYRTEC) 10 MG tablet Take 10 mg by mouth daily.     Yes [provider]  cyanocobalamin 2000 MCG tablet Take 2,500 mcg by mouth daily.    Yes [provider]  febuxostat (ULORIC) 40 MG tablet Take 40 mg by mouth daily.   Yes [provider]  folic acid  (FOLVITE) 400 MCG tablet Take 1,200 mcg by mouth daily.   Yes [provider]  furosemide (LASIX) 20 MG tablet Take 1 tablet (20 mg total) by mouth every other day. 07/25/17  Yes Randel Pigg, Dorma Russell, MD  guaiFENesin (MUCINEX) 600 MG 12 hr tablet Take 1,200 mg by mouth 2 (two) times daily.   Yes [provider]  InFLIXimab (REMICADE IV) Inject into the vein every 6 (six) weeks.   Yes [provider]  ipratropium-albuterol (DUONEB) 0.5-2.5 (3) MG/3ML SOLN Take 3 mLs by nebulization every 6 (six) hours.    Yes [provider]  metFORMIN (GLUCOPHAGE) 500 MG tablet Take 1 tablet (500 mg total) by mouth 2 (two) times daily with a meal. 07/23/17 08/22/17 Yes Randel Pigg, Dorma Russell, MD  methotrexate (RHEUMATREX) 2.5 MG tablet Take 10 mg by mouth once a week. On Mondays 02/11/11  Yes [provider]  mometasone-formoterol (DULERA) 200-5 MCG/ACT AERO Inhale 2 puffs into the lungs 2 (two) times daily. 07/23/17  Yes Lenox Ponds, MD  Multiple Vitamin (MULTIVITAMIN) tablet Take 1 tablet by mouth daily.     Yes [provider]  nicotine (NICODERM CQ - DOSED IN MG/24 HOURS) 14 mg/24hr patch Place 1 patch (14 mg total) onto the skin daily. 07/24/17  Yes Randel Pigg, Dorma Russell, MD  OXYGEN BIPAP with 1lpm o2   Yes [provider]  pantoprazole (PROTONIX) 40 MG tablet Take 40 mg by mouth 2 (two) times daily.    Yes [provider]  predniSONE (DELTASONE) 10 MG tablet Take 5 tablets for 4 days; Take 4 tablets for 3 days; then take 3 tablets daily. 07/23/17  Yes Lenox Ponds, MD  Respiratory Therapy Supplies (FLUTTER) DEVI Use as directed 11/04/16  Yes Nyoka Cowden, MD  Tiotropium Bromide Monohydrate (SPIRIVA RESPIMAT) 2.5 MCG/ACT AERS Inhale 2 puffs into the lungs daily. 05/31/17  Yes Nyoka Cowden, MD    Family History Family History  Problem Relation Age of Onset  . Emphysema Father   . Asthma Father   . Osteoarthritis Father   . Heart  attack Mother   . Uterine cancer Mother   . Heart disease Mother   . ADD / ADHD Son   . Depression Son   . Hypertension Brother   . Heart disease Brother        Pacemaker   . Heart murmur Brother     Social History Social History   Tobacco Use  . Smoking status: Current Every Day Smoker    Packs/day: 1.50    Years: 53.00    Pack years: 79.50    Types:  E-cigarettes  . Smokeless tobacco: Never Used  . Tobacco comment: 1-1 1/2 pk per day  Substance Use Topics  . Alcohol use: Yes    Alcohol/week: 0.0 oz    Comment: Occasional  . Drug use: No     Allergies   Chantix [varenicline]   Review of Systems Review of Systems  Constitutional: Negative for chills and fever.  Musculoskeletal: Negative for arthralgias and joint swelling.  Skin: Positive for wound.  Neurological: Negative for weakness and numbness.  All other systems reviewed and are negative.    Physical Exam Updated Vital Signs BP 114/83   Pulse (!) 101   Temp 97.6 F (36.4 C) (Oral)   Resp (!) 24   SpO2 94%   Physical Exam  Constitutional: He appears well-developed and well-nourished. No distress.  HENT:  Head: Normocephalic and atraumatic.  Eyes: Right eye exhibits no discharge. Left eye exhibits no discharge.  Cardiovascular: Normal rate, regular rhythm, normal heart sounds and intact distal pulses.  Pulmonary/Chest: Effort normal and breath sounds normal. No stridor. No respiratory distress. He has no wheezes. He has no rales.  Respirations equal and unlabored, patient able to speak in full sentences, lungs clear to auscultation bilaterally  Musculoskeletal:  Small 1 cm slice laceration to the medial fingertip of the left index finger with small skin flap, bleeding was initially controlled with manipulation of finger immediately started bleeding again, normal range of motion at all joints, sensation intact  Neurological: He is alert. Coordination normal.  Skin: Skin is warm and dry. He is not  diaphoretic.  Psychiatric: He has a normal mood and affect. His behavior is normal.  Nursing note and vitals reviewed.    ED Treatments / Results  Labs (all labs ordered are listed, but only abnormal results are displayed) Labs Reviewed - No data to display  EKG None  Radiology No results found.  Procedures .Marland KitchenLaceration Repair Date/Time: 08/01/2017 4:34 PM Performed by: Dartha Lodge, PA-C Authorized by: Dartha Lodge, PA-C   Consent:    Consent obtained:  Verbal   Consent given by:  Patient   Risks discussed:  Infection, pain and poor wound healing (bleeding)   Alternatives discussed:  No treatment Anesthesia (see MAR for exact dosages):    Anesthesia method:  Local infiltration   Local anesthetic:  Lidocaine 1% w/o epi Laceration details:    Location:  Finger   Finger location:  L index finger   Length (cm):  1   Depth (mm):  2 Repair type:    Repair type:  Simple Pre-procedure details:    Preparation:  Patient was prepped and draped in usual sterile fashion Exploration:    Hemostasis achieved with:  Direct pressure (wound seal)   Wound extent: areolar tissue violated   Treatment:    Area cleansed with:  Saline   Amount of cleaning:  Standard   Irrigation solution:  Sterile saline   Irrigation method:  Syringe Skin repair:    Repair method:  Sutures   Suture size:  4-0   Suture material:  Prolene   Suture technique:  Simple interrupted   Number of sutures:  4 Approximation:    Approximation:  Close Post-procedure details:    Dressing:  Bulky dressing   Patient tolerance of procedure:  Tolerated well, no immediate complications   (including critical care time)  Medications Ordered in ED Medications  lidocaine (PF) (XYLOCAINE) 1 % injection 10 mL (10 mLs Infiltration Given 08/01/17 1549)  Tdap (BOOSTRIX) injection 0.5  mL (0.5 mLs Intramuscular Given 08/01/17 1549)     Initial Impression / Assessment and Plan / ED Course  I have reviewed the triage  vital signs and the nursing notes.  Pertinent labs & imaging results that were available during my care of the patient were reviewed by me and considered in my medical decision making (see chart for details).  Small laceration to the left index finger with small skin flap actively bleeding despite pressure dressing, bleeding initially improved with pressure but with any manipulation typically starts to bleed again.  Patient on Eliquis for recent PE.  Will place small sutures to hold down the skin flap as I think this will help the most with the bleeding.  Will also apply wound seal powder.  Tetanus updated.  Digital nerve block with good anesthesia, laceration closed with 4 small sutures with improvement in bleeding, small amount of wound seal closed and finger wrapped with gauze and Coban, will recheck the finger in 15 minutes to ensure good hemostasis.   Wound with good hemostasis, feel patient is stable for discharge home.  Counseled on appropriate wound care.  Patient follow-up with his primary care doctor in 7 to 10 days for suture removal.  He will return for any additional bleeding.  Patient expresses understanding and is in agreement with plan.  Patient discussed with Dr. Fayrene Fearing, who saw patient as well and agrees with plan.   Final Clinical Impressions(s) / ED Diagnoses   Final diagnoses:  Laceration of left index finger without foreign body without damage to nail, initial encounter    ED Discharge Orders    None       Legrand Rams 08/01/17 2320    Rolland Porter, MD 08/06/17 [phone]

## 2017-08-01 NOTE — ED Triage Notes (Signed)
Patient c/o laceration to left index finger after cutting it with a knife today. Reports taking eliquis. Patient reports difficulty controlling bleeding. Gauze applied in triage.

## 2017-08-01 NOTE — Discharge Instructions (Addendum)
Stitches will need to be removed in about 7-10 days, keep the wound clean and dry and covered with a dressing and antibiotic ointment.  You may shower but do not submerge the finger underwater, no swimming.  If the wound starts bleeding again or you notice redness, swelling, drainage or increasing pain return to the emergency department for reevaluation otherwise please follow-up with your primary care doctor.

## 2017-08-02 DIAGNOSIS — I5031 Acute diastolic (congestive) heart failure: Secondary | ICD-10-CM | POA: Diagnosis not present

## 2017-08-02 DIAGNOSIS — E1122 Type 2 diabetes mellitus with diabetic chronic kidney disease: Secondary | ICD-10-CM | POA: Diagnosis not present

## 2017-08-02 DIAGNOSIS — I130 Hypertensive heart and chronic kidney disease with heart failure and stage 1 through stage 4 chronic kidney disease, or unspecified chronic kidney disease: Secondary | ICD-10-CM | POA: Diagnosis not present

## 2017-08-02 DIAGNOSIS — J439 Emphysema, unspecified: Secondary | ICD-10-CM | POA: Diagnosis not present

## 2017-08-02 DIAGNOSIS — S81811D Laceration without foreign body, right lower leg, subsequent encounter: Secondary | ICD-10-CM | POA: Diagnosis not present

## 2017-08-02 DIAGNOSIS — S81812D Laceration without foreign body, left lower leg, subsequent encounter: Secondary | ICD-10-CM | POA: Diagnosis not present

## 2017-08-03 ENCOUNTER — Other Ambulatory Visit (HOSPITAL_COMMUNITY): Payer: Medicare Other

## 2017-08-03 DIAGNOSIS — I5031 Acute diastolic (congestive) heart failure: Secondary | ICD-10-CM | POA: Diagnosis not present

## 2017-08-03 DIAGNOSIS — S81812D Laceration without foreign body, left lower leg, subsequent encounter: Secondary | ICD-10-CM | POA: Diagnosis not present

## 2017-08-03 DIAGNOSIS — E1122 Type 2 diabetes mellitus with diabetic chronic kidney disease: Secondary | ICD-10-CM | POA: Diagnosis not present

## 2017-08-03 DIAGNOSIS — I130 Hypertensive heart and chronic kidney disease with heart failure and stage 1 through stage 4 chronic kidney disease, or unspecified chronic kidney disease: Secondary | ICD-10-CM | POA: Diagnosis not present

## 2017-08-03 DIAGNOSIS — S81811D Laceration without foreign body, right lower leg, subsequent encounter: Secondary | ICD-10-CM | POA: Diagnosis not present

## 2017-08-03 DIAGNOSIS — J439 Emphysema, unspecified: Secondary | ICD-10-CM | POA: Diagnosis not present

## 2017-08-04 DIAGNOSIS — S81812D Laceration without foreign body, left lower leg, subsequent encounter: Secondary | ICD-10-CM | POA: Diagnosis not present

## 2017-08-04 DIAGNOSIS — S81811D Laceration without foreign body, right lower leg, subsequent encounter: Secondary | ICD-10-CM | POA: Diagnosis not present

## 2017-08-04 DIAGNOSIS — I5031 Acute diastolic (congestive) heart failure: Secondary | ICD-10-CM | POA: Diagnosis not present

## 2017-08-04 DIAGNOSIS — E1122 Type 2 diabetes mellitus with diabetic chronic kidney disease: Secondary | ICD-10-CM | POA: Diagnosis not present

## 2017-08-04 DIAGNOSIS — I130 Hypertensive heart and chronic kidney disease with heart failure and stage 1 through stage 4 chronic kidney disease, or unspecified chronic kidney disease: Secondary | ICD-10-CM | POA: Diagnosis not present

## 2017-08-04 DIAGNOSIS — J439 Emphysema, unspecified: Secondary | ICD-10-CM | POA: Diagnosis not present

## 2017-08-05 DIAGNOSIS — I5031 Acute diastolic (congestive) heart failure: Secondary | ICD-10-CM | POA: Diagnosis not present

## 2017-08-05 DIAGNOSIS — S81811D Laceration without foreign body, right lower leg, subsequent encounter: Secondary | ICD-10-CM | POA: Diagnosis not present

## 2017-08-05 DIAGNOSIS — E1122 Type 2 diabetes mellitus with diabetic chronic kidney disease: Secondary | ICD-10-CM | POA: Diagnosis not present

## 2017-08-05 DIAGNOSIS — I130 Hypertensive heart and chronic kidney disease with heart failure and stage 1 through stage 4 chronic kidney disease, or unspecified chronic kidney disease: Secondary | ICD-10-CM | POA: Diagnosis not present

## 2017-08-05 DIAGNOSIS — S81812D Laceration without foreign body, left lower leg, subsequent encounter: Secondary | ICD-10-CM | POA: Diagnosis not present

## 2017-08-05 DIAGNOSIS — J439 Emphysema, unspecified: Secondary | ICD-10-CM | POA: Diagnosis not present

## 2017-08-06 DIAGNOSIS — J439 Emphysema, unspecified: Secondary | ICD-10-CM | POA: Diagnosis not present

## 2017-08-06 DIAGNOSIS — S81811D Laceration without foreign body, right lower leg, subsequent encounter: Secondary | ICD-10-CM | POA: Diagnosis not present

## 2017-08-06 DIAGNOSIS — S81812D Laceration without foreign body, left lower leg, subsequent encounter: Secondary | ICD-10-CM | POA: Diagnosis not present

## 2017-08-06 DIAGNOSIS — I130 Hypertensive heart and chronic kidney disease with heart failure and stage 1 through stage 4 chronic kidney disease, or unspecified chronic kidney disease: Secondary | ICD-10-CM | POA: Diagnosis not present

## 2017-08-06 DIAGNOSIS — I5031 Acute diastolic (congestive) heart failure: Secondary | ICD-10-CM | POA: Diagnosis not present

## 2017-08-06 DIAGNOSIS — E1122 Type 2 diabetes mellitus with diabetic chronic kidney disease: Secondary | ICD-10-CM | POA: Diagnosis not present

## 2017-08-09 DIAGNOSIS — I5031 Acute diastolic (congestive) heart failure: Secondary | ICD-10-CM | POA: Diagnosis not present

## 2017-08-09 DIAGNOSIS — S81811D Laceration without foreign body, right lower leg, subsequent encounter: Secondary | ICD-10-CM | POA: Diagnosis not present

## 2017-08-09 DIAGNOSIS — E1122 Type 2 diabetes mellitus with diabetic chronic kidney disease: Secondary | ICD-10-CM | POA: Diagnosis not present

## 2017-08-09 DIAGNOSIS — I130 Hypertensive heart and chronic kidney disease with heart failure and stage 1 through stage 4 chronic kidney disease, or unspecified chronic kidney disease: Secondary | ICD-10-CM | POA: Diagnosis not present

## 2017-08-09 DIAGNOSIS — J439 Emphysema, unspecified: Secondary | ICD-10-CM | POA: Diagnosis not present

## 2017-08-09 DIAGNOSIS — S81812D Laceration without foreign body, left lower leg, subsequent encounter: Secondary | ICD-10-CM | POA: Diagnosis not present

## 2017-08-10 DIAGNOSIS — E1122 Type 2 diabetes mellitus with diabetic chronic kidney disease: Secondary | ICD-10-CM | POA: Diagnosis not present

## 2017-08-10 DIAGNOSIS — S81811D Laceration without foreign body, right lower leg, subsequent encounter: Secondary | ICD-10-CM | POA: Diagnosis not present

## 2017-08-10 DIAGNOSIS — S81812D Laceration without foreign body, left lower leg, subsequent encounter: Secondary | ICD-10-CM | POA: Diagnosis not present

## 2017-08-10 DIAGNOSIS — J439 Emphysema, unspecified: Secondary | ICD-10-CM | POA: Diagnosis not present

## 2017-08-10 DIAGNOSIS — I130 Hypertensive heart and chronic kidney disease with heart failure and stage 1 through stage 4 chronic kidney disease, or unspecified chronic kidney disease: Secondary | ICD-10-CM | POA: Diagnosis not present

## 2017-08-10 DIAGNOSIS — I5031 Acute diastolic (congestive) heart failure: Secondary | ICD-10-CM | POA: Diagnosis not present

## 2017-08-11 ENCOUNTER — Ambulatory Visit: Payer: Medicare Other | Admitting: Internal Medicine

## 2017-08-11 DIAGNOSIS — J439 Emphysema, unspecified: Secondary | ICD-10-CM | POA: Diagnosis not present

## 2017-08-11 DIAGNOSIS — S81811D Laceration without foreign body, right lower leg, subsequent encounter: Secondary | ICD-10-CM | POA: Diagnosis not present

## 2017-08-11 DIAGNOSIS — E1122 Type 2 diabetes mellitus with diabetic chronic kidney disease: Secondary | ICD-10-CM | POA: Diagnosis not present

## 2017-08-11 DIAGNOSIS — I5031 Acute diastolic (congestive) heart failure: Secondary | ICD-10-CM | POA: Diagnosis not present

## 2017-08-11 DIAGNOSIS — S81812D Laceration without foreign body, left lower leg, subsequent encounter: Secondary | ICD-10-CM | POA: Diagnosis not present

## 2017-08-11 DIAGNOSIS — I130 Hypertensive heart and chronic kidney disease with heart failure and stage 1 through stage 4 chronic kidney disease, or unspecified chronic kidney disease: Secondary | ICD-10-CM | POA: Diagnosis not present

## 2017-08-12 ENCOUNTER — Emergency Department (HOSPITAL_COMMUNITY): Payer: Medicare Other

## 2017-08-12 ENCOUNTER — Observation Stay (HOSPITAL_COMMUNITY)
Admission: EM | Admit: 2017-08-12 | Discharge: 2017-08-13 | Disposition: A | Discharge status: Home / Self Care | Payer: Medicare Other | Attending: Internal Medicine | Admitting: Internal Medicine

## 2017-08-12 ENCOUNTER — Encounter (HOSPITAL_COMMUNITY): Payer: Self-pay

## 2017-08-12 ENCOUNTER — Other Ambulatory Visit: Payer: Self-pay

## 2017-08-12 DIAGNOSIS — R42 Dizziness and giddiness: Secondary | ICD-10-CM | POA: Insufficient documentation

## 2017-08-12 DIAGNOSIS — S0990XA Unspecified injury of head, initial encounter: Secondary | ICD-10-CM | POA: Diagnosis not present

## 2017-08-12 DIAGNOSIS — I110 Hypertensive heart disease with heart failure: Secondary | ICD-10-CM | POA: Diagnosis not present

## 2017-08-12 DIAGNOSIS — Z96642 Presence of left artificial hip joint: Secondary | ICD-10-CM | POA: Insufficient documentation

## 2017-08-12 DIAGNOSIS — I5030 Unspecified diastolic (congestive) heart failure: Secondary | ICD-10-CM | POA: Insufficient documentation

## 2017-08-12 DIAGNOSIS — J45909 Unspecified asthma, uncomplicated: Secondary | ICD-10-CM | POA: Diagnosis not present

## 2017-08-12 DIAGNOSIS — Z7901 Long term (current) use of anticoagulants: Secondary | ICD-10-CM | POA: Diagnosis not present

## 2017-08-12 DIAGNOSIS — R739 Hyperglycemia, unspecified: Secondary | ICD-10-CM

## 2017-08-12 DIAGNOSIS — R0602 Shortness of breath: Secondary | ICD-10-CM | POA: Insufficient documentation

## 2017-08-12 DIAGNOSIS — K219 Gastro-esophageal reflux disease without esophagitis: Secondary | ICD-10-CM | POA: Diagnosis present

## 2017-08-12 DIAGNOSIS — Y929 Unspecified place or not applicable: Secondary | ICD-10-CM | POA: Diagnosis not present

## 2017-08-12 DIAGNOSIS — E785 Hyperlipidemia, unspecified: Secondary | ICD-10-CM | POA: Diagnosis present

## 2017-08-12 DIAGNOSIS — I82403 Acute embolism and thrombosis of unspecified deep veins of lower extremity, bilateral: Secondary | ICD-10-CM | POA: Diagnosis present

## 2017-08-12 DIAGNOSIS — J449 Chronic obstructive pulmonary disease, unspecified: Secondary | ICD-10-CM | POA: Diagnosis present

## 2017-08-12 DIAGNOSIS — R000 Tachycardia, unspecified: Secondary | ICD-10-CM | POA: Diagnosis not present

## 2017-08-12 DIAGNOSIS — Z7984 Long term (current) use of oral hypoglycemic drugs: Secondary | ICD-10-CM | POA: Diagnosis not present

## 2017-08-12 DIAGNOSIS — R531 Weakness: Secondary | ICD-10-CM | POA: Diagnosis not present

## 2017-08-12 DIAGNOSIS — N183 Chronic kidney disease, stage 3 unspecified: Secondary | ICD-10-CM | POA: Diagnosis present

## 2017-08-12 DIAGNOSIS — I10 Essential (primary) hypertension: Secondary | ICD-10-CM | POA: Diagnosis present

## 2017-08-12 DIAGNOSIS — I5033 Acute on chronic diastolic (congestive) heart failure: Secondary | ICD-10-CM | POA: Diagnosis present

## 2017-08-12 DIAGNOSIS — Y9389 Activity, other specified: Secondary | ICD-10-CM | POA: Diagnosis not present

## 2017-08-12 DIAGNOSIS — Z96652 Presence of left artificial knee joint: Secondary | ICD-10-CM | POA: Diagnosis not present

## 2017-08-12 DIAGNOSIS — Z96612 Presence of left artificial shoulder joint: Secondary | ICD-10-CM | POA: Diagnosis not present

## 2017-08-12 DIAGNOSIS — R52 Pain, unspecified: Secondary | ICD-10-CM | POA: Diagnosis not present

## 2017-08-12 DIAGNOSIS — Z86711 Personal history of pulmonary embolism: Secondary | ICD-10-CM | POA: Diagnosis present

## 2017-08-12 DIAGNOSIS — Z79899 Other long term (current) drug therapy: Secondary | ICD-10-CM | POA: Diagnosis not present

## 2017-08-12 DIAGNOSIS — M069 Rheumatoid arthritis, unspecified: Secondary | ICD-10-CM | POA: Diagnosis present

## 2017-08-12 DIAGNOSIS — Y998 Other external cause status: Secondary | ICD-10-CM | POA: Diagnosis not present

## 2017-08-12 DIAGNOSIS — Z7982 Long term (current) use of aspirin: Secondary | ICD-10-CM | POA: Diagnosis not present

## 2017-08-12 DIAGNOSIS — I951 Orthostatic hypotension: Secondary | ICD-10-CM | POA: Diagnosis not present

## 2017-08-12 DIAGNOSIS — Z9981 Dependence on supplemental oxygen: Secondary | ICD-10-CM | POA: Diagnosis not present

## 2017-08-12 DIAGNOSIS — F17290 Nicotine dependence, other tobacco product, uncomplicated: Secondary | ICD-10-CM | POA: Insufficient documentation

## 2017-08-12 DIAGNOSIS — R5383 Other fatigue: Secondary | ICD-10-CM | POA: Diagnosis not present

## 2017-08-12 DIAGNOSIS — W19XXXA Unspecified fall, initial encounter: Secondary | ICD-10-CM | POA: Diagnosis not present

## 2017-08-12 DIAGNOSIS — F17210 Nicotine dependence, cigarettes, uncomplicated: Secondary | ICD-10-CM | POA: Insufficient documentation

## 2017-08-12 DIAGNOSIS — R55 Syncope and collapse: Secondary | ICD-10-CM | POA: Diagnosis not present

## 2017-08-12 DIAGNOSIS — N179 Acute kidney failure, unspecified: Secondary | ICD-10-CM | POA: Diagnosis present

## 2017-08-12 LAB — CBC WITH DIFFERENTIAL/PLATELET
Abs Immature Granulocytes: 0.1 10*3/uL (ref 0.0–0.1)
Basophils Absolute: 0 10*3/uL (ref 0.0–0.1)
Basophils Relative: 0 %
Eosinophils Absolute: 0 10*3/uL (ref 0.0–0.7)
Eosinophils Relative: 0 %
HCT: 37.1 % — ABNORMAL LOW (ref 39.0–52.0)
Hemoglobin: 12.4 g/dL — ABNORMAL LOW (ref 13.0–17.0)
Immature Granulocytes: 2 %
Lymphocytes Relative: 24 %
Lymphs Abs: 1.9 10*3/uL (ref 0.7–4.0)
MCH: 33.4 pg (ref 26.0–34.0)
MCHC: 33.4 g/dL (ref 30.0–36.0)
MCV: 100 fL (ref 78.0–100.0)
Monocytes Absolute: 0.2 10*3/uL (ref 0.1–1.0)
Monocytes Relative: 3 %
Neutro Abs: 5.8 10*3/uL (ref 1.7–7.7)
Neutrophils Relative %: 71 %
Platelets: 143 10*3/uL — ABNORMAL LOW (ref 150–400)
RBC: 3.71 MIL/uL — ABNORMAL LOW (ref 4.22–5.81)
RDW: 16.7 % — ABNORMAL HIGH (ref 11.5–15.5)
WBC: 8 10*3/uL (ref 4.0–10.5)

## 2017-08-12 LAB — I-STAT TROPONIN, ED: Troponin i, poc: 0.05 ng/mL (ref 0.00–0.08)

## 2017-08-12 NOTE — ED Triage Notes (Signed)
Pt BIB [facility] for eval of fall and generalized weakness. Pt reports he takes his lasix every other day and typically feels more weak than normal when he takes it. States he took it this AM, was feeling weak all day and went to use the restroom. Pt reports while standing to use to the restroom he felt dizzy, fell backwards and struck his head on a carpeted floor. Denies LOC,  Currently anticoagulated on eliquis for DVT

## 2017-08-12 NOTE — ED Provider Notes (Addendum)
[facility] EMERGENCY DEPARTMENT Provider Note   CSN: [id number] Arrival date & time: 08/12/17  2220     History   Chief Complaint Chief Complaint  Patient presents with  . Fall  . Weakness    HPI Chad Duke is a 72 y.o. male.  Patient with past medical history remarkable for CHF, on Lasix, DVT, anticoagulated with Eliquis, presents to the emergency department with a chief complaint of syncope.  He states that when he is taking his Lasix, he feels fatigued and experiences generalized weakness.  He states that this occurred today.  He states that he stood up to walk into another room, and became lightheaded and passed out.  He did strike his head.  He denies any slurred speech, vision changes, numbness, weakness, or tingling.  He denies any pain at this time.  He wears 2 L of home O2 24/7.  He states that he always has some mild shortness of breath, but he has no new or worsening shortness of breath today.  He denies any chest pain.  Reports that his lower extremity swelling is about normal for him.  The history is provided by the patient. No language interpreter was used.    Past Medical History:  Diagnosis Date  . AAA (abdominal aortic aneurysm) (HCC)   . Allergy    Rhinitis  . Arthritis    Rhuematoid  . Asthma   . Chronic renal insufficiency   . Diverticulosis   . Emphysema   . GERD (gastroesophageal reflux disease)   . Hearing loss of both ears   . Hiatal hernia   . Hyperlipidemia   . Hypertension   . Male hypogonadism   . Nephrolithiasis   . Nephrolithiasis   . Sleep apnea    On CPAP  . Stasis dermatitis   . Varicose veins   . Vitamin B 12 deficiency     Patient Active Problem List   Diagnosis Date Noted  . Deep vein thrombosis (DVT) of both lower extremities (HCC)   . Hyperglycemia   . Pulmonary embolus (HCC) 07/20/2017  . Acute diastolic CHF (congestive heart failure) (HCC) 07/20/2017  . Bilateral leg edema 07/20/2017  . Acute  exacerbation of chronic obstructive pulmonary disease (COPD) (HCC) 07/19/2017  . 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 Cigarette smoker 09/08/2016  . Morbid obesity due to excess calories (HCC) 09/08/2016  . Pain in joint, ankle and foot 03/31/2012  . Abnormality of gait 03/31/2012  . Rheumatoid arthritis(714.0) 03/31/2012  . COPD GOLD III  02/12/2011  . Sleep apnea 02/12/2011    Past Surgical History:  Procedure Laterality Date  . CARPAL TUNNEL RELEASE     bilateral  . FRACTURE SURGERY Left    Fibula  . JOINT REPLACEMENT Left    Shoulder  . JOINT REPLACEMENT Left    Hip   . KIDNEY STONE SURGERY  2003  . left hip replacement    . left knee    . left shoulder    . LITHOTRIPSY          Home Medications    Prior to Admission medications   Medication Sig Start Date End Date Taking? Authorizing Provider  albuterol (PROVENTIL HFA;VENTOLIN HFA) 108 (90 Base) MCG/ACT inhaler Inhale 2 puffs into the lungs every 4 (four) hours as needed for wheezing or shortness of breath. 07/30/17   Nyoka Cowden, MD  apixaban (ELIQUIS) 5 MG TABS tablet Take two-5mg  tablets (total 10mg ) twice daily for 7 days.  On day #8, starting taking one-5mg  tablet twice daily Patient taking differently: Take 5 mg by mouth 2 (two) times daily.  07/23/17   Lenox Ponds, MD  Ascorbic Acid (VITAMIN C) 1000 MG tablet Take 1,000 mg by mouth daily.    [provider]  aspirin EC 81 MG tablet Take 81 mg by mouth daily.    [provider]  atorvastatin (LIPITOR) 40 MG tablet Take 1 tablet (40 mg total) by mouth daily. 05/18/17 08/16/17  Lewayne Bunting, MD  Calcium-Magnesium-Vitamin D (CITRACAL CALCIUM+D) 600-40-500 MG-MG-UNIT TB24 Take 1 tablet by mouth daily.      [provider]  cetirizine (ZYRTEC) 10 MG tablet Take 10 mg by mouth daily.      [provider]  cyanocobalamin 2000 MCG tablet Take 2,500 mcg by mouth daily.     [provider]  febuxostat (ULORIC) 40  MG tablet Take 40 mg by mouth daily.    [provider]  folic acid (FOLVITE) 400 MCG tablet Take 1,200 mcg by mouth daily.    [provider]  furosemide (LASIX) 20 MG tablet Take 1 tablet (20 mg total) by mouth every other day. 07/25/17   Lenox Ponds, MD  guaiFENesin (MUCINEX) 600 MG 12 hr tablet Take 1,200 mg by mouth 2 (two) times daily.    [provider]  InFLIXimab (REMICADE IV) Inject into the vein every 6 (six) weeks.    [provider]  ipratropium-albuterol (DUONEB) 0.5-2.5 (3) MG/3ML SOLN Take 3 mLs by nebulization every 6 (six) hours.     [provider]  metFORMIN (GLUCOPHAGE) 500 MG tablet Take 1 tablet (500 mg total) by mouth 2 (two) times daily with a meal. 07/23/17 08/22/17  Randel Pigg, Dorma Russell, MD  methotrexate (RHEUMATREX) 2.5 MG tablet Take 10 mg by mouth once a week. On Mondays 02/11/11   [provider]  mometasone-formoterol (DULERA) 200-5 MCG/ACT AERO Inhale 2 puffs into the lungs 2 (two) times daily. 07/23/17   Lenox Ponds, MD  Multiple Vitamin (MULTIVITAMIN) tablet Take 1 tablet by mouth daily.      [provider]  nicotine (NICODERM CQ - DOSED IN MG/24 HOURS) 14 mg/24hr patch Place 1 patch (14 mg total) onto the skin daily. 07/24/17   Lenox Ponds, MD  OXYGEN BIPAP with 1lpm o2    [provider]  pantoprazole (PROTONIX) 40 MG tablet Take 40 mg by mouth 2 (two) times daily.     [provider]  predniSONE (DELTASONE) 10 MG tablet Take 5 tablets for 4 days; Take 4 tablets for 3 days; then take 3 tablets daily. 07/23/17   Lenox Ponds, MD  Respiratory Therapy Supplies (FLUTTER) DEVI Use as directed 11/04/16   Nyoka Cowden, MD  Tiotropium Bromide Monohydrate (SPIRIVA RESPIMAT) 2.5 MCG/ACT AERS Inhale 2 puffs into the lungs daily. 05/31/17   Nyoka Cowden, MD    Family History Family History  Problem Relation Age of Onset  . Emphysema Father   . Asthma Father   .  Osteoarthritis Father   . Heart attack Mother   . Uterine cancer Mother   . Heart disease Mother   . ADD / ADHD Son   . Depression Son   . Hypertension Brother   . Heart disease Brother        Pacemaker   . Heart murmur Brother     Social History Social History   Tobacco Use  . Smoking status: Current Every  Day Smoker    Packs/day: 1.50    Years: 53.00    Pack years: 79.50    Types: E-cigarettes  . Smokeless tobacco: Never Used  . Tobacco comment: 1-1 1/2 pk per day  Substance Use Topics  . Alcohol use: Yes    Alcohol/week: 0.0 oz    Comment: Occasional  . Drug use: No     Allergies   Chantix [varenicline]   Review of Systems Review of Systems  All other systems reviewed and are negative.    Physical Exam Updated Vital Signs BP 106/82   Pulse (!) 116   Temp 98.3 F (36.8 C) (Oral)   Resp (!) 26   Ht 5\' 7"  (1.702 m)   Wt 117.5 kg (259 lb)   SpO2 98%   BMI 40.57 kg/m   Physical Exam  Constitutional: He is oriented to person, place, and time. He appears well-developed and well-nourished.  HENT:  Head: Normocephalic and atraumatic.  2 x 2 centimeter contusion to the occipital scalp.  Eyes: Pupils are equal, round, and reactive to light. Conjunctivae and EOM are normal. Right eye exhibits no discharge. Left eye exhibits no discharge. No scleral icterus.  Neck: Normal range of motion. Neck supple. No JVD present.  No cervical spine tenderness  Cardiovascular: Normal rate, regular rhythm and normal heart sounds. Exam reveals no gallop and no friction rub.  No murmur heard. Pulmonary/Chest: Effort normal and breath sounds normal. No respiratory distress. He has no wheezes. He has no rales. He exhibits no tenderness.  Abdominal: Soft. He exhibits no distension and no mass. There is no tenderness. There is no rebound and no guarding.  Bruising to lower abdomen, which appears old (patient reports this from insulin injections)  Musculoskeletal: Normal range of  motion. He exhibits edema. He exhibits no tenderness or deformity.  2+ pitting edema in bilateral lower extremities  Neurological: He is alert and oriented to person, place, and time.  Skin: Skin is warm and dry.  Psychiatric: He has a normal mood and affect. His behavior is normal. Judgment and thought content normal.  Nursing note and vitals reviewed.    ED Treatments / Results  Labs (all labs ordered are listed, but only abnormal results are displayed) Labs Reviewed - No data to display  EKG None None  Radiology No results found.  Procedures Procedures (including critical care time)  Medications Ordered in ED Medications - No data to display   Initial Impression / Assessment and Plan / ED Course  I have reviewed the triage vital signs and the nursing notes.  Pertinent labs & imaging results that were available during my care of the patient were reviewed by me and considered in my medical decision making (see chart for details).     Patient with generalized weakness and fall.  He is on Lasix and has CHF.  He takes Lasix every other day.  His initial orthostatic vital signs are concerning, his standing blood pressure is in the 80s.  Troponin is 0.05, but patient denies any chest pain.   Patient given 500 mL's of fluid.  He is still profoundly orthostatic.  He does have peripheral edema.  Patient seen by discussed with Dr. Clarene Duke.  Appreciate Dr. Clyde Lundborg for admitting the patient.  Final Clinical Impressions(s) / ED Diagnoses   Final diagnoses:  Orthostasis    ED Discharge Orders    None         Roxy Horseman, PA-C 08/13/17 0615    Little, Fleet Contras  Lequita Halt, MD 08/13/17 1601

## 2017-08-13 DIAGNOSIS — N183 Chronic kidney disease, stage 3 unspecified: Secondary | ICD-10-CM | POA: Diagnosis present

## 2017-08-13 DIAGNOSIS — E876 Hypokalemia: Secondary | ICD-10-CM

## 2017-08-13 DIAGNOSIS — W19XXXA Unspecified fall, initial encounter: Secondary | ICD-10-CM | POA: Diagnosis present

## 2017-08-13 DIAGNOSIS — J449 Chronic obstructive pulmonary disease, unspecified: Secondary | ICD-10-CM

## 2017-08-13 DIAGNOSIS — E785 Hyperlipidemia, unspecified: Secondary | ICD-10-CM | POA: Diagnosis present

## 2017-08-13 DIAGNOSIS — I951 Orthostatic hypotension: Secondary | ICD-10-CM

## 2017-08-13 DIAGNOSIS — I10 Essential (primary) hypertension: Secondary | ICD-10-CM | POA: Diagnosis not present

## 2017-08-13 DIAGNOSIS — N179 Acute kidney failure, unspecified: Secondary | ICD-10-CM | POA: Diagnosis not present

## 2017-08-13 DIAGNOSIS — I824Y3 Acute embolism and thrombosis of unspecified deep veins of proximal lower extremity, bilateral: Secondary | ICD-10-CM | POA: Diagnosis not present

## 2017-08-13 DIAGNOSIS — R42 Dizziness and giddiness: Secondary | ICD-10-CM | POA: Diagnosis not present

## 2017-08-13 DIAGNOSIS — R5383 Other fatigue: Secondary | ICD-10-CM | POA: Diagnosis not present

## 2017-08-13 DIAGNOSIS — I2699 Other pulmonary embolism without acute cor pulmonale: Secondary | ICD-10-CM

## 2017-08-13 DIAGNOSIS — K219 Gastro-esophageal reflux disease without esophagitis: Secondary | ICD-10-CM | POA: Diagnosis not present

## 2017-08-13 DIAGNOSIS — R531 Weakness: Secondary | ICD-10-CM | POA: Diagnosis not present

## 2017-08-13 DIAGNOSIS — I5032 Chronic diastolic (congestive) heart failure: Secondary | ICD-10-CM

## 2017-08-13 LAB — BASIC METABOLIC PANEL
Anion gap: 16 — ABNORMAL HIGH (ref 5–15)
Anion gap: 14 (ref 5–15)
Anion gap: 11 (ref 5–15)
BUN: 49 mg/dL — ABNORMAL HIGH (ref 8–23)
BUN: 46 mg/dL — ABNORMAL HIGH (ref 8–23)
BUN: 43 mg/dL — ABNORMAL HIGH (ref 8–23)
CO2: 28 mmol/L (ref 22–32)
CO2: 27 mmol/L (ref 22–32)
CO2: 27 mmol/L (ref 22–32)
Calcium: 9.5 mg/dL (ref 8.9–10.3)
Calcium: 9.2 mg/dL (ref 8.9–10.3)
Calcium: 9.1 mg/dL (ref 8.9–10.3)
Chloride: 91 mmol/L — ABNORMAL LOW (ref 98–111)
Chloride: 93 mmol/L — ABNORMAL LOW (ref 98–111)
Chloride: 95 mmol/L — ABNORMAL LOW (ref 98–111)
Creatinine, Ser: 1.9 mg/dL — ABNORMAL HIGH (ref 0.61–1.24)
Creatinine, Ser: 1.69 mg/dL — ABNORMAL HIGH (ref 0.61–1.24)
Creatinine, Ser: 1.78 mg/dL — ABNORMAL HIGH (ref 0.61–1.24)
GFR calc Af Amer: 39 mL/min — ABNORMAL LOW (ref >60)
GFR calc Af Amer: 45 mL/min — ABNORMAL LOW (ref >60)
GFR calc Af Amer: 42 mL/min — ABNORMAL LOW (ref >60)
GFR calc non Af Amer: 34 mL/min — ABNORMAL LOW (ref >60)
GFR calc non Af Amer: 39 mL/min — ABNORMAL LOW (ref >60)
GFR calc non Af Amer: 36 mL/min — ABNORMAL LOW (ref >60)
Glucose, Bld: 152 mg/dL — ABNORMAL HIGH (ref 70–99)
Glucose, Bld: 118 mg/dL — ABNORMAL HIGH (ref 70–99)
Glucose, Bld: 280 mg/dL — ABNORMAL HIGH (ref 70–99)
Potassium: 3.6 mmol/L (ref 3.5–5.1)
Potassium: 2.6 mmol/L — CL (ref 3.5–5.1)
Potassium: 3.2 mmol/L — ABNORMAL LOW (ref 3.5–5.1)
Sodium: 135 mmol/L (ref 135–145)
Sodium: 134 mmol/L — ABNORMAL LOW (ref 135–145)
Sodium: 133 mmol/L — ABNORMAL LOW (ref 135–145)

## 2017-08-13 LAB — I-STAT TROPONIN, ED: Troponin i, poc: 0.06 ng/mL (ref 0.00–0.08)

## 2017-08-13 LAB — CBC
HCT: 36.9 % — ABNORMAL LOW (ref 39.0–52.0)
Hemoglobin: 12.5 g/dL — ABNORMAL LOW (ref 13.0–17.0)
MCH: 33.7 pg (ref 26.0–34.0)
MCHC: 33.9 g/dL (ref 30.0–36.0)
MCV: 99.5 fL (ref 78.0–100.0)
Platelets: 155 10*3/uL (ref 150–400)
RBC: 3.71 MIL/uL — ABNORMAL LOW (ref 4.22–5.81)
RDW: 16.8 % — ABNORMAL HIGH (ref 11.5–15.5)
WBC: 9.5 10*3/uL (ref 4.0–10.5)

## 2017-08-13 LAB — BRAIN NATRIURETIC PEPTIDE: B Natriuretic Peptide: 55.9 pg/mL (ref 0.0–100.0)

## 2017-08-13 MED ORDER — POLYETHYLENE GLYCOL 3350 17 G PO PACK: 17.0000 g | PACK | Freq: Every day | ORAL | Status: DC | PRN

## 2017-08-13 MED ORDER — POTASSIUM CHLORIDE CRYS ER 20 MEQ PO TBCR
40.0000 meq | EXTENDED_RELEASE_TABLET | Freq: Once | ORAL | Status: AC
  Administered 2017-08-13: 40 meq via ORAL
  Filled 2017-08-13: qty 2

## 2017-08-13 MED ORDER — ONDANSETRON HCL 4 MG/2ML IJ SOLN
4.0000 mg | Freq: Three times a day (TID) | INTRAMUSCULAR | Status: DC | PRN
Start: 2017-08-13 — End: 2017-08-13

## 2017-08-13 MED ORDER — HYDRALAZINE HCL 20 MG/ML IJ SOLN: 5.0000 mg | INTRAMUSCULAR | Status: DC | PRN

## 2017-08-13 MED ORDER — ADULT MULTIVITAMIN W/MINERALS CH
1.0000 | ORAL_TABLET | Freq: Every day | ORAL | Status: DC
  Administered 2017-08-13: 1 via ORAL
  Filled 2017-08-13: qty 1

## 2017-08-13 MED ORDER — VITAMIN B-12 1000 MCG PO TABS
2500.0000 ug | ORAL_TABLET | Freq: Every day | ORAL | Status: DC
  Administered 2017-08-13: 2500 ug via ORAL
  Filled 2017-08-13: qty 3

## 2017-08-13 MED ORDER — METHOTREXATE 2.5 MG PO TABS: 10.0000 mg | ORAL_TABLET | ORAL | Status: DC

## 2017-08-13 MED ORDER — POTASSIUM CHLORIDE 10 MEQ/100ML IV SOLN
10.0000 meq | INTRAVENOUS | Status: AC
  Administered 2017-08-13 (×4): 10 meq via INTRAVENOUS
  Filled 2017-08-13 (×4): qty 100

## 2017-08-13 MED ORDER — CALCIUM CARBONATE-VITAMIN D 500-200 MG-UNIT PO TABS
1.0000 | ORAL_TABLET | Freq: Every day | ORAL | Status: DC
  Administered 2017-08-13: 1 via ORAL
  Filled 2017-08-13: qty 1

## 2017-08-13 MED ORDER — VITAMIN C 500 MG PO TABS
1000.0000 mg | ORAL_TABLET | Freq: Every day | ORAL | Status: DC
  Administered 2017-08-13: 1000 mg via ORAL
  Filled 2017-08-13: qty 2

## 2017-08-13 MED ORDER — LORATADINE 10 MG PO TABS
10.0000 mg | ORAL_TABLET | Freq: Every day | ORAL | Status: DC
  Administered 2017-08-13: 10 mg via ORAL
  Filled 2017-08-13: qty 1

## 2017-08-13 MED ORDER — TIOTROPIUM BROMIDE MONOHYDRATE 18 MCG IN CAPS
18.0000 ug | ORAL_CAPSULE | Freq: Every day | RESPIRATORY_TRACT | Status: DC
Start: 2017-08-13 — End: 2017-08-13
  Administered 2017-08-13: 18 ug via RESPIRATORY_TRACT
  Filled 2017-08-13: qty 5

## 2017-08-13 MED ORDER — GUAIFENESIN ER 600 MG PO TB12
1200.0000 mg | ORAL_TABLET | Freq: Two times a day (BID) | ORAL | Status: DC
  Administered 2017-08-13: 1200 mg via ORAL
  Filled 2017-08-13: qty 2

## 2017-08-13 MED ORDER — ATORVASTATIN CALCIUM 20 MG PO TABS
40.0000 mg | ORAL_TABLET | Freq: Every day | ORAL | Status: DC
  Administered 2017-08-13: 40 mg via ORAL
  Filled 2017-08-13: qty 2

## 2017-08-13 MED ORDER — APIXABAN 5 MG PO TABS
5.0000 mg | ORAL_TABLET | Freq: Two times a day (BID) | ORAL | Status: DC
  Administered 2017-08-13: 5 mg via ORAL
  Filled 2017-08-13: qty 1

## 2017-08-13 MED ORDER — MAGNESIUM SULFATE IN D5W 1-5 GM/100ML-% IV SOLN
1.0000 g | Freq: Once | INTRAVENOUS | Status: DC
  Filled 2017-08-13: qty 100

## 2017-08-13 MED ORDER — POTASSIUM CHLORIDE CRYS ER 20 MEQ PO TBCR
60.0000 meq | EXTENDED_RELEASE_TABLET | Freq: Once | ORAL | Status: AC
  Administered 2017-08-13: 60 meq via ORAL
  Filled 2017-08-13: qty 3

## 2017-08-13 MED ORDER — MOMETASONE FURO-FORMOTEROL FUM 200-5 MCG/ACT IN AERO
2.0000 | INHALATION_SPRAY | Freq: Two times a day (BID) | RESPIRATORY_TRACT | Status: DC
  Administered 2017-08-13: 2 via RESPIRATORY_TRACT
  Filled 2017-08-13: qty 8.8

## 2017-08-13 MED ORDER — ZOLPIDEM TARTRATE 5 MG PO TABS: 5.0000 mg | ORAL_TABLET | Freq: Every evening | ORAL | Status: DC | PRN

## 2017-08-13 MED ORDER — PANTOPRAZOLE SODIUM 40 MG PO TBEC
40.0000 mg | DELAYED_RELEASE_TABLET | Freq: Two times a day (BID) | ORAL | Status: DC
  Administered 2017-08-13: 40 mg via ORAL
  Filled 2017-08-13: qty 1

## 2017-08-13 MED ORDER — SODIUM CHLORIDE 0.9 % IV SOLN
INTRAVENOUS | Status: AC
Start: 2017-08-13 — End: 2017-08-13
  Administered 2017-08-13: 05:00:00 via INTRAVENOUS

## 2017-08-13 MED ORDER — LEVALBUTEROL HCL 1.25 MG/0.5ML IN NEBU
INHALATION_SOLUTION | RESPIRATORY_TRACT | Status: AC
  Filled 2017-08-13: qty 0.5

## 2017-08-13 MED ORDER — NICOTINE 14 MG/24HR TD PT24
14.0000 mg | MEDICATED_PATCH | Freq: Every day | TRANSDERMAL | Status: DC
  Administered 2017-08-13: 14 mg via TRANSDERMAL
  Filled 2017-08-13: qty 1

## 2017-08-13 MED ORDER — ALBUTEROL SULFATE (2.5 MG/3ML) 0.083% IN NEBU: 2.5000 mg | INHALATION_SOLUTION | RESPIRATORY_TRACT | Status: DC | PRN

## 2017-08-13 MED ORDER — FEBUXOSTAT 40 MG PO TABS
40.0000 mg | ORAL_TABLET | Freq: Every day | ORAL | Status: DC
  Administered 2017-08-13: 40 mg via ORAL
  Filled 2017-08-13: qty 1

## 2017-08-13 MED ORDER — IPRATROPIUM-ALBUTEROL 0.5-2.5 (3) MG/3ML IN SOLN
3.0000 mL | Freq: Three times a day (TID) | RESPIRATORY_TRACT | Status: DC
  Administered 2017-08-13: 3 mL via RESPIRATORY_TRACT
  Filled 2017-08-13: qty 3

## 2017-08-13 MED ORDER — IPRATROPIUM-ALBUTEROL 0.5-2.5 (3) MG/3ML IN SOLN
3.0000 mL | Freq: Four times a day (QID) | RESPIRATORY_TRACT | Status: DC
  Administered 2017-08-13: 3 mL via RESPIRATORY_TRACT
  Filled 2017-08-13: qty 3

## 2017-08-13 MED ORDER — FOLIC ACID 1 MG PO TABS
1000.0000 ug | ORAL_TABLET | Freq: Every day | ORAL | Status: DC
  Administered 2017-08-13: 1 mg via ORAL
  Filled 2017-08-13: qty 1

## 2017-08-13 MED ORDER — ORAL CARE MOUTH RINSE
15.0000 mL | Freq: Two times a day (BID) | OROMUCOSAL | Status: DC
  Administered 2017-08-13: 15 mL via OROMUCOSAL

## 2017-08-13 MED ORDER — LEVALBUTEROL HCL 1.25 MG/0.5ML IN NEBU
1.2500 mg | INHALATION_SOLUTION | RESPIRATORY_TRACT | Status: DC | PRN
  Administered 2017-08-13: 1.25 mg via RESPIRATORY_TRACT

## 2017-08-13 MED ORDER — SODIUM CHLORIDE 0.9 % IV BOLUS
500.0000 mL | Freq: Once | INTRAVENOUS | Status: AC
  Administered 2017-08-13: 500 mL via INTRAVENOUS

## 2017-08-13 MED ORDER — ASPIRIN EC 81 MG PO TBEC
81.0000 mg | DELAYED_RELEASE_TABLET | Freq: Every day | ORAL | Status: DC
  Administered 2017-08-13: 81 mg via ORAL
  Filled 2017-08-13: qty 1

## 2017-08-13 MED ORDER — ACETAMINOPHEN 325 MG PO TABS: 650.0000 mg | ORAL_TABLET | Freq: Four times a day (QID) | ORAL | Status: DC | PRN

## 2017-08-13 NOTE — Discharge Instructions (Addendum)
Near-Syncope Near-syncope is when you suddenly get weak or dizzy, or you feel like you might pass out (faint). During an episode of near-syncope, you may:  Feel dizzy or light-headed.  Feel sick to your stomach (nauseous).  See all white or all black.  Have cold, clammy skin.  If you passed out, get help right away.Call your local emergency services (911 in the [place] Do not drive yourself to the hospital. Follow these instructions at home: Pay attention to any changes in your symptoms. Take these actions to help with your condition:  Have someone stay with you until you feel stable.  Do not drive, use machinery, or play sports until your doctor says it is okay.  Keep all follow-up visits as told by your doctor. This is important.  If you start to feel like you might pass out, lie down right away and raise (elevate) your feet above the level of your heart. Breathe deeply and steadily. Wait until all of the symptoms are gone.  Drink enough fluid to keep your pee (urine) clear or pale yellow.  If you are taking blood pressure or heart medicine, get up slowly and spend many minutes getting ready to sit and then stand. This can help with dizziness.  Take over-the-counter and prescription medicines only as told by your doctor.  Get help right away if:  You have a very bad headache.  You have unusual pain in your chest, tummy, or back.  You are bleeding from your mouth or rectum.  You have black or tarry poop (stool).  You have a very fast or uneven heartbeat (palpitations).  You pass out one time or more than once.  You have jerky movements that you cannot control (seizure).  You are confused.  You have trouble walking.  You are very weak.  You have vision problems. These symptoms may be an emergency. Do not wait to see if the symptoms will go away. Get medical help right away. Call your local emergency services (911 in the [place] Do not drive yourself to the  hospital. This information is not intended to replace advice given to you by your health care provider. Make sure you discuss any questions you have with your health care provider. Document Released: 07/15/2007 Document Revised: 07/04/2015 Document Reviewed: 10/10/2014 Elsevier Interactive Patient Education  2017 [employer]. Information on my medicine - ELIQUIS (apixaban)  Why was Eliquis prescribed for you? Eliquis was prescribed to treat blood clots that may have been found in the veins of your legs (deep vein thrombosis) or in your lungs (pulmonary embolism) and to reduce the risk of them occurring again.  What do You need to know about Eliquis ? The dose is ONE 5 mg tablet taken TWICE daily.  Eliquis may be taken with or without food.   Try to take the dose about the same time in the morning and in the evening. If you have difficulty swallowing the tablet whole please discuss with your pharmacist how to take the medication safely.  Take Eliquis exactly as prescribed and DO NOT stop taking Eliquis without talking to the doctor who prescribed the medication.  Stopping may increase your risk of developing a new blood clot.  Refill your prescription before you run out.  After discharge, you should have regular check-up appointments with your healthcare provider that is prescribing your Eliquis.    What do you do if you miss a dose? If a dose of ELIQUIS is not taken at the scheduled  time, take it as soon as possible on the same day and twice-daily administration should be resumed. The dose should not be doubled to make up for a missed dose.  Important Safety Information A possible side effect of Eliquis is bleeding. You should call your healthcare provider right away if you experience any of the following: ? Bleeding from an injury or your nose that does not stop. ? Unusual colored urine (red or dark brown) or unusual colored stools (red or black). ? Unusual bruising for unknown  reasons. ? A serious fall or if you hit your head (even if there is no bleeding).  Some medicines may interact with Eliquis and might increase your risk of bleeding or clotting while on Eliquis. To help avoid this, consult your healthcare provider or pharmacist prior to using any new prescription or non-prescription medications, including herbals, vitamins, non-steroidal anti-inflammatory drugs (NSAIDs) and supplements.  This website has more information on Eliquis (apixaban): http://www.eliquis.com/eliquis/home

## 2017-08-13 NOTE — Progress Notes (Signed)
Paged [facility] again for K of 2.6. No new orders or call received since last page.

## 2017-08-13 NOTE — Evaluation (Addendum)
Physical Therapy Evaluation Patient Details Name: Chad Duke MRN: [id number] DOB: 11-Mar-1945 Today's Date: 08/13/2017   History of Present Illness  Chad Duke is a 72 y.o. male with medical history significant of dCHF, hypertension, hyperlipidemia, diabetes mellitus, COPD on 2 L nasal cannula oxygen, GERD, gout, rheumatoid arthritis, DVT/PE on Eliquis, AAA, CKD-3, varicose vein, OSA on BiPAP, who presents with fall, dizziness and lightheadedness.  Clinical Impression  Patient presents with limited activity tolerance, decreased balance with recent fall and weakness.  Currently S level for mobility and remains high fall risk.  Educated on using device to decrease fall risk especially with out to MD visits and to get assistance for transportation.  He already had HHPT/OT/RN/aide set up and will need to resume services after d/c home likely today.  Feel he should highly consider ALF as does not have stamina to care for himself appropriately.  Will sign off as planned d/c today per pt.     Follow Up Recommendations Home health PT    Equipment Recommendations  None recommended by PT    Recommendations for Other Services       Precautions / Restrictions Precautions Precautions: Fall Precaution Comments: watch orthostatics, O2 dependent Restrictions Weight Bearing Restrictions: No      Mobility  Bed Mobility               General bed mobility comments: up in recliner  Transfers Overall transfer level: Needs assistance Equipment used: None Transfers: Sit to/from Stand Sit to Stand: Supervision         General transfer comment: pushing up from armrests on chair no physical help  Ambulation/Gait Ambulation/Gait assistance: Supervision Gait Distance (Feet): 120 Feet Assistive device: None(occasional use of wall railing) Gait Pattern/deviations: Step-through pattern;Step-to pattern;Decreased stride length;Wide base of support     General Gait Details: reports some  unsteadiness/weakness; noted at times seemed as if difficult to lift feet and some anterior bias with high fall risk, improved when using railing; SpO2 94% with dyspnea 2/4 on 2L O2   Stairs            Wheelchair Mobility    Modified Rankin (Stroke Patients Only)       Balance Overall balance assessment: Needs assistance   Sitting balance-Leahy Scale: Good       Standing balance-Leahy Scale: Good Standing balance comment: can move without UE support, but balance deficits noted; and remains high fall risk                             Pertinent Vitals/Pain Pain Assessment: No/denies pain    Home Living Family/patient expects to be discharged to:: Private residence Living Arrangements: Alone Available Help at Discharge: Friend(s);Available PRN/intermittently Type of Home: Apartment Home Access: Level entry;Stairs to enter   Entrance Stairs-Number of Steps: one step into building Home Layout: One level Home Equipment: Toilet riser;Cane - single point;Walker - 4 wheels;Shower seat Additional Comments: already getting HHPT/OT/RN/aide    Prior Function     Gait / Transfers Assistance Needed: overall independent, occasional use of SPC when he feels "wobbly"   ADL's / Homemaking Assistance Needed: assist for bathing 2x/week, assist for donning compression socks  Comments: completes grcoery shopping (uses electric cart), driving, works as an Therapist, 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)     Hand Dominance   Dominant Hand: Right    Extremity/Trunk Assessment   Upper Extremity Assessment Upper Extremity Assessment: Defer to OT evaluation  Lower Extremity Assessment Lower Extremity Assessment: Generalized weakness(edema normal for him in bilat LE)    Cervical / Trunk Assessment Cervical / Trunk Assessment: Normal  Communication   Communication: HOH  Cognition Arousal/Alertness: Awake/alert Behavior During Therapy: WFL for tasks  assessed/performed Overall Cognitive Status: Within Functional Limits for tasks assessed                                        General Comments General comments (skin integrity, edema, etc.): discussed using cane vs rollator for doctor visits and getting help for transportation; states uses electric scooter at store, and rollator won't fit into kitchen in his home.  Reviewed safety precautions for fall prevention including not showering if aide not there    Exercises     Assessment/Plan    PT Assessment All further PT needs can be met in the next venue of care  PT Problem List         PT Treatment Interventions      PT Goals (Current goals can be found in the Care Plan section)  Acute Rehab PT Goals Patient Stated Goal: return to work; regain independence  PT Goal Formulation: All assessment and education complete, DC therapy    Frequency     Barriers to discharge        Co-evaluation               AM-PAC PT "6 Clicks" Daily Activity  Outcome Measure Difficulty turning over in bed (including adjusting bedclothes, sheets and blankets)?: A Little Difficulty moving from lying on back to sitting on the side of the bed? : A Little Difficulty sitting down on and standing up from a chair with arms (e.g., wheelchair, bedside commode, etc,.)?: A Little Help needed moving to and from a bed to chair (including a wheelchair)?: A Little Help needed walking in hospital room?: A Little Help needed climbing 3-5 steps with a railing? : A Little 6 Click Score: 18    End of Session Equipment Utilized During Treatment: Oxygen Activity Tolerance: Patient limited by fatigue Patient left: with call bell/phone within reach;in chair   PT Visit Diagnosis: Unsteadiness on feet (R26.81);Difficulty in walking, not elsewhere classified (R26.2)    Time: 3254-9826 PT Time Calculation (min) (ACUTE ONLY): 32 min   Charges:   PT Evaluation $PT Eval Moderate Complexity: 1  Mod PT Treatments $Gait Training: 8-22 mins   PT G CodesMagda Kiel, [place] [phone] 08/13/2017   Reginia Naas 08/13/2017, 12:59 PM

## 2017-08-13 NOTE — H&P (Signed)
History and Physical    [name] WRU:[id number] DOB: 08-22-1945 DOA: 08/12/2017  Referring MD/NP/PA:   PCP: [name], MD   Patient coming from:  The patient is coming from home.  At baseline, pt is independent for most of ADL.   Chief Complaint: Lightheadedness, dizziness, fall  HPI: Chad Duke is a 72 y.o. male with medical history significant of dCHF, hypertension, hyperlipidemia, diabetes mellitus, COPD on 2 L nasal cannula oxygen, GERD, gout, rheumatoid arthritis, DVT/PE on Eliquis, AAA, CKD-3, varicose vein, OSA on BiPAP, who presents with fall, dizziness and lightheadedness.  Pt states that he is taking Lasix for bilateral leg edema, which makes him feel generalized weak and lightheaded. He states that he stood up to walk into another room in this PM, and became lightheaded and fell backwards. He struck his head on a carpeted floor. He states that he did not pass out and had no LOC. I asked him again if he passed out or had LOC, he denied again.  Patient denies unilateral weakness, numbness or tingling his extremities, no facial droop or slurred speech.  He does not have any chest pain, cough, fever or chills.  He has chronic shortness of breath due to COPD, which is at his baseline.  No nausea vomiting, diarrhea, abdominal pain, symptoms of UTI. He has chronic lower extremity swelling and is wearing compression hose for it. Per EDP, pt had profoundly orthostatic vital signs in ED.    ED Course: pt was found to have negative troponin, BNP 55.9, WBC 8.0, worsening renal function, temperature normal, tachycardia, tachypnea, oxygen saturation 96% on 2 L nasal cannula oxygen, chest x-ray negative.  CT head is negative for acute intracranial abnormalities.  CT of C-spine is negative for bony fracture.  Patient is placed on telemetry bed for observation.  Review of Systems:   General: no fevers, chills, has fatigue.  HEENT: no blurry vision, hearing changes or sore throat Respiratory:  has dyspnea, coughing, no heezing CV: no chest pain, no palpitations GI: no nausea, vomiting, abdominal pain, diarrhea, constipation GU: no dysuria, burning on urination, increased urinary frequency, hematuria  Ext: has leg leg edema Neuro: no unilateral weakness, numbness, or tingling, no vision change or hearing loss. Has dizziness and fall. Skin: has bruises in arms and small skin tear in right arm MSK: No muscle spasm, no deformity, no limitation of range of movement in spin Heme: No easy bruising.  Travel history: No recent long distant travel.  Allergy:  Allergies  Allergen Reactions  . Chantix [Varenicline] Other (See Comments)    Suicidal thoughts    Past Medical History:  Diagnosis Date  . AAA (abdominal aortic aneurysm) (HCC)   . Allergy    Rhinitis  . Arthritis    Rhuematoid  . Asthma   . Chronic renal insufficiency   . Diverticulosis   . Emphysema   . GERD (gastroesophageal reflux disease)   . Hearing loss of both ears   . Hiatal hernia   . Hyperlipidemia   . Hypertension   . Male hypogonadism   . Nephrolithiasis   . Nephrolithiasis   . Sleep apnea    On CPAP  . Stasis dermatitis   . Varicose veins   . Vitamin B 12 deficiency     Past Surgical History:  Procedure Laterality Date  . CARPAL TUNNEL RELEASE     bilateral  . FRACTURE SURGERY Left    Fibula  . JOINT REPLACEMENT Left    Shoulder  . JOINT  REPLACEMENT Left    Hip   . KIDNEY STONE SURGERY  2003  . left hip replacement    . left knee    . left shoulder    . LITHOTRIPSY      Social History:  reports that he has been smoking e-cigarettes.  He has a 79.50 pack-year smoking history. He has never used smokeless tobacco. He reports that he drinks alcohol. He reports that he does not use drugs.  Family History:  Family History  Problem Relation Age of Onset  . Emphysema Father   . Asthma Father   . Osteoarthritis Father   . Heart attack Mother   . Uterine cancer Mother   . Heart  disease Mother   . ADD / ADHD Son   . Depression Son   . Hypertension Brother   . Heart disease Brother        Pacemaker   . Heart murmur Brother      Prior to Admission medications   Medication Sig Start Date End Date Taking? Authorizing Provider  albuterol (PROVENTIL HFA;VENTOLIN HFA) 108 (90 Base) MCG/ACT inhaler Inhale 2 puffs into the lungs every 4 (four) hours as needed for wheezing or shortness of breath. 07/30/17  Yes Nyoka Cowden, MD  apixaban (ELIQUIS) 5 MG TABS tablet Take two-5mg  tablets (total 10mg ) twice daily for 7 days.  On day #8, starting taking one-5mg  tablet twice daily Patient taking differently: Take 5 mg by mouth 2 (two) times daily.  07/23/17  Yes Randel Pigg, Dorma Russell, MD  Ascorbic Acid (VITAMIN C) 1000 MG tablet Take 1,000 mg by mouth daily.   Yes [provider]  aspirin EC 81 MG tablet Take 81 mg by mouth daily.   Yes [provider]  atorvastatin (LIPITOR) 40 MG tablet Take 1 tablet (40 mg total) by mouth daily. 05/18/17 08/16/17 Yes Lewayne Bunting, MD  Calcium-Magnesium-Vitamin D (CITRACAL CALCIUM+D) 600-40-500 MG-MG-UNIT TB24 Take 1 tablet by mouth daily.     Yes [provider]  cetirizine (ZYRTEC) 10 MG tablet Take 10 mg by mouth daily.     Yes [provider]  cyanocobalamin 2000 MCG tablet Take 2,500 mcg by mouth daily.    Yes [provider]  febuxostat (ULORIC) 40 MG tablet Take 40 mg by mouth daily.   Yes [provider]  folic acid (FOLVITE) 400 MCG tablet Take 1,200 mcg by mouth daily.   Yes [provider]  furosemide (LASIX) 20 MG tablet Take 1 tablet (20 mg total) by mouth every other day. 07/25/17  Yes Randel Pigg, Dorma Russell, MD  guaiFENesin (MUCINEX) 600 MG 12 hr tablet Take 1,200 mg by mouth 2 (two) times daily.   Yes [provider]  InFLIXimab (REMICADE IV) Inject into the vein every 6 (six) weeks.   Yes [provider]  ipratropium-albuterol (DUONEB) 0.5-2.5 (3) MG/3ML  SOLN Take 3 mLs by nebulization every 6 (six) hours.    Yes [provider]  metFORMIN (GLUCOPHAGE) 500 MG tablet Take 1 tablet (500 mg total) by mouth 2 (two) times daily with a meal. 07/23/17 08/22/17 Yes Randel Pigg, Dorma Russell, MD  methotrexate (RHEUMATREX) 2.5 MG tablet Take 10 mg by mouth once a week. On Mondays 02/11/11  Yes [provider]  mometasone-formoterol (DULERA) 200-5 MCG/ACT AERO Inhale 2 puffs into the lungs 2 (two) times daily. 07/23/17  Yes Lenox Ponds, MD  Multiple Vitamin (MULTIVITAMIN) tablet Take 1 tablet by mouth daily.     Yes [provider]  OXYGEN BIPAP with 1lpm o2   Yes [provider]  pantoprazole (PROTONIX) 40 MG tablet Take 40 mg by mouth 2 (two) times daily.    Yes [provider]  predniSONE (DELTASONE) 10 MG tablet Take 5 tablets for 4 days; Take 4 tablets for 3 days; then take 3 tablets daily. Patient taking differently: Take 15 mg by mouth 2 (two) times daily.  07/23/17  Yes Lenox Ponds, MD  Respiratory Therapy Supplies (FLUTTER) DEVI Use as directed 11/04/16  Yes Nyoka Cowden, MD  Tiotropium Bromide Monohydrate (SPIRIVA RESPIMAT) 2.5 MCG/ACT AERS Inhale 2 puffs into the lungs daily. 05/31/17  Yes Nyoka Cowden, MD  nicotine (NICODERM CQ - DOSED IN MG/24 HOURS) 14 mg/24hr patch Place 1 patch (14 mg total) onto the skin daily. Patient not taking: Reported on 08/12/2017 07/24/17   Lenox Ponds, MD    Physical Exam: Vitals:   08/12/17 2230 08/12/17 2315 08/13/17 0015 08/13/17 0115  BP: 106/82 (!) 127/94 123/81 116/71  Pulse: (!) 116 (!) 108 (!) 105 (!) 107  Resp:  11 (!) 22   Temp:      TempSrc:      SpO2: 98% 99% 98% 97%  Weight:      Height:       General: Not in acute distress. Has small area of contusion to the occipital scalp.  HEENT:       Eyes: PERRL, EOMI, no scleral icterus.       ENT: No discharge from the ears and nose, no pharynx injection, no tonsillar enlargement.        Neck:  No JVD, no bruit, no mass felt. Heme: No neck lymph node enlargement. Cardiac: S1/S2, RRR, No murmurs, No gallops or rubs. Respiratory: No rales, wheezing, rhonchi or rubs. GI: Soft, nondistended, nontender, no rebound pain, no organomegaly, BS present. GU: No hematuria Ext: 2+ pitting leg edema bilaterally. 2+DP/PT pulse bilaterally. Musculoskeletal: No joint deformities, No joint redness or warmth, no limitation of ROM in spin. Skin:  has bruises in arms and small skin tear in right arm Neuro: Alert, oriented X3, cranial nerves II-XII grossly intact, moves all extremities normally. Muscle strength 5/5 in all extremities, sensation to light touch intact. Brachial reflex 2+ bilaterally. Negative Babinski's sign. Normal finger to nose test. Psych: Patient is not psychotic, no suicidal or hemocidal ideation.  Labs on Admission: I have personally reviewed following labs and imaging studies  CBC: Recent Labs  Lab 08/12/17 2238  WBC 8.0  NEUTROABS 5.8  HGB 12.4*  HCT 37.1*  MCV 100.0  PLT 143*   Basic Metabolic Panel: Recent Labs  Lab 08/12/17 2238  NA 135  K 3.6  CL 91*  CO2 28  GLUCOSE 152*  BUN 49*  CREATININE 1.90*  CALCIUM 9.5   GFR: Estimated Creatinine Clearance: 43.1 mL/min (A) (by C-G formula based on SCr of 1.9 mg/dL (H)). Liver Function Tests: No results for input(s): AST, ALT, ALKPHOS, BILITOT, PROT, ALBUMIN in the last 168 hours. No results for input(s): LIPASE, AMYLASE in the last 168 hours. No results for input(s): AMMONIA in the last 168 hours. Coagulation Profile: No results for input(s): INR, PROTIME in the last 168 hours. Cardiac Enzymes: No results for input(s): CKTOTAL, CKMB, CKMBINDEX, TROPONINI in the last 168 hours. BNP (last 3 results) No results for input(s): PROBNP in the last 8760 hours. HbA1C: No results for input(s): HGBA1C in the last 72 hours. CBG: No results for input(s): GLUCAP in the  last 168 hours. Lipid Profile: No results for  input(s): CHOL, HDL, LDLCALC, TRIG, CHOLHDL, LDLDIRECT in the last 72 hours. Thyroid Function Tests: No results for input(s): TSH, T4TOTAL, FREET4, T3FREE, THYROIDAB in the last 72 hours. Anemia Panel: No results for input(s): VITAMINB12, FOLATE, FERRITIN, TIBC, IRON, RETICCTPCT in the last 72 hours. Urine analysis:    Component Value Date/Time   COLORURINE YELLOW 07/19/2017 2000   APPEARANCEUR CLEAR 07/19/2017 2000   LABSPEC 1.017 07/19/2017 2000   PHURINE 5.0 07/19/2017 2000   GLUCOSEU NEGATIVE 07/19/2017 2000   HGBUR LARGE (A) 07/19/2017 2000   BILIRUBINUR NEGATIVE 07/19/2017 2000   KETONESUR NEGATIVE 07/19/2017 2000   PROTEINUR NEGATIVE 07/19/2017 2000   UROBILINOGEN 0.2 03/28/2007 1330   NITRITE NEGATIVE 07/19/2017 2000   LEUKOCYTESUR NEGATIVE 07/19/2017 2000   Sepsis Labs: @LABRCNTIP (procalcitonin:4,lacticidven:4) )No results found for this or any previous visit (from the past 240 hour(s)).   Radiological Exams on Admission: Dg Chest 2 View  Result Date: 08/12/2017 CLINICAL DATA:  72 y/o  M; fall and generalized weakness. EXAM: CHEST - 2 VIEW COMPARISON:  07/20/2017 CT angiogram of the chest. 07/19/2017 chest radiograph. FINDINGS: Stable heart size and mediastinal contours are within normal limits. Both lungs are clear. Partially visualized left shoulder arthroplasty and degenerative changes. No acute osseous abnormality is evident. IMPRESSION: No acute pulmonary process identified. Electronically Signed   By: 09/18/2017 M.D.   On: 08/12/2017 23:35   Ct Head Wo Contrast  Result Date: 08/12/2017 CLINICAL DATA:  Syncopal episode, hit head on floor EXAM: CT HEAD WITHOUT CONTRAST CT CERVICAL SPINE WITHOUT CONTRAST TECHNIQUE: Multidetector CT imaging of the head and cervical spine was performed following the standard protocol without intravenous contrast. Multiplanar CT image reconstructions of the cervical spine were also generated. COMPARISON:  Report 02/26/2007  FINDINGS: CT HEAD FINDINGS Brain: No acute territorial infarction, hemorrhage or intracranial mass. Moderate atrophy. Prominent ventricles, felt secondary to atrophy. Vascular: No hyperdense vessels. Scattered calcifications at the carotid siphons. Skull: Normal. Negative for fracture or focal lesion. Sinuses/Orbits: No acute finding. Patchy opacification of the ethmoid sinuses. Other: None CT CERVICAL SPINE FINDINGS Alignment: Reversal of cervical lordosis. No subluxation. Facet alignment within normal limits. Skull base and vertebrae: No acute fracture. No primary bone lesion or focal pathologic process. Soft tissues and spinal canal: No prevertebral fluid or swelling. No visible canal hematoma. Disc levels: Moderate degenerative changes at C5-C6. Mild degenerative changes at C3-C4 and C4-C5. Upper chest: Negative. Other: None IMPRESSION: 1. No CT evidence for acute intracranial abnormality.  Atrophy. 2. Reversal of cervical lordosis.  No acute osseous abnormality. Electronically Signed   By: 02/28/2007 M.D.   On: 08/12/2017 23:23   Ct Cervical Spine Wo Contrast  Result Date: 08/12/2017 CLINICAL DATA:  Syncopal episode, hit head on floor EXAM: CT HEAD WITHOUT CONTRAST CT CERVICAL SPINE WITHOUT CONTRAST TECHNIQUE: Multidetector CT imaging of the head and cervical spine was performed following the standard protocol without intravenous contrast. Multiplanar CT image reconstructions of the cervical spine were also generated. COMPARISON:  Report 02/26/2007 FINDINGS: CT HEAD FINDINGS Brain: No acute territorial infarction, hemorrhage or intracranial mass. Moderate atrophy. Prominent ventricles, felt secondary to atrophy. Vascular: No hyperdense vessels. Scattered calcifications at the carotid siphons. Skull: Normal. Negative for fracture or focal lesion. Sinuses/Orbits: No acute finding. Patchy opacification of the ethmoid sinuses. Other: None CT CERVICAL SPINE FINDINGS Alignment: Reversal of cervical lordosis. No  subluxation. Facet alignment within normal limits. Skull base and vertebrae: No acute fracture. No primary  bone lesion or focal pathologic process. Soft tissues and spinal canal: No prevertebral fluid or swelling. No visible canal hematoma. Disc levels: Moderate degenerative changes at C5-C6. Mild degenerative changes at C3-C4 and C4-C5. Upper chest: Negative. Other: None IMPRESSION: 1. No CT evidence for acute intracranial abnormality.  Atrophy. 2. Reversal of cervical lordosis.  No acute osseous abnormality. Electronically Signed   By: Jasmine Pang M.D.   On: 08/12/2017 23:23     EKG:  Not done in ED, will get one.   Assessment/Plan Principal Problem:   Fall Active Problems:   COPD GOLD III    Rheumatoid arthritis (HCC)   Cigarette smoker   Pulmonary embolus (HCC)   Chronic diastolic CHF (congestive heart failure) (HCC)   Deep vein thrombosis (DVT) of both lower extremities (HCC)   Acute renal failure superimposed on stage 3 chronic kidney disease (HCC)   HLD (hyperlipidemia)   Essential hypertension   GERD (gastroesophageal reflux disease)   Orthostatic hypotension   Fall: CT-head and C spin negative. Most likely due to orthostatic status. Per ED physician, patient had profound orthostatic vitals in ED.  This is secondary to Lasix use.  Patient states that he is taking Lasix at 20 mg every other day. No focal neurologic findings on physical examination, less likely to have TIA or stroke.  Patient does not have any chest pain or palpitation.  -will place on tele bed for obs -gentle IVF: received 500 cc of NS bolus, will continue NS at 75 cc/h for 8hours -PT/OT (pt states that he has home PT)  Chronic diastolic CHF: 2D echo on 07/20/2017 showed EF of 65-70% with grade 1 diastolic dysfunction.  Patient has bilateral 2+ leg edema, but no JVD, no pulmonary edema chest x-ray, BNP is 55.9, clinically patient does not have CHF exacerbation.  His bilateral leg edema is likely due to varicose  vein venous insufficiency. -hold lasix -instructed pt to elevated lower extremities when sleep  OSA: -BiPAP  Rheumatoid arthritis (HCC): pt is getting Remicade injection and taking methotrexate -Continue methotrexate-  Hx of cigarette smoker: -Nicotine patch  Pulmonary embolus/DVT: -Eliquis  AoCKD-III: Baseline Cre is 1.2-1.3, pt's Cre is1.90 and BUN 49 on admission. Likely due to prerenal secondary to dehydration and continuation diuretics. - IVF as above - Follow up renal function by BMP - Hold lasix  HLD: -lipitor  Essential hypertension: Bp 116/71 -hold lasix -if BP elevated-->may need to start oral Bp meds -Hydralazine IV PRN   GERD: -Protonix  COPD GOLD III : Stable. -Continue bronchodilator  DVT ppx: on Eliquis Code Status: DNR (I discussed with patient, and explained the meaning of CODE STATUS. Patient wants to be DNR) Family Communication: None at bed side.     Disposition Plan:  Anticipate discharge back to previous home environment Consults called:  none Admission status: Obs / tele    Date of Service 08/13/2017    Lorretta Harp [facility] Pager [phone]  If 7PM-7AM, please contact night-coverage [email or web address] Password [email or web address] 08/13/2017, 3:56 AM

## 2017-08-13 NOTE — Progress Notes (Signed)
Notified [facility] K 2.6

## 2017-08-13 NOTE — Progress Notes (Signed)
Nutrition Brief Note  Patient identified on the Malnutrition Screening Tool (MST) Report. Patient reports weight loss recently because he has been trying to eat better and fluid loss from recent start of Lasix. Nutrition focused physical exam completed.  No muscle or subcutaneous fat depletion noticed. Moderate-severe edema to BLE.   Wt Readings from Last 15 Encounters:  08/13/17 236 lb 1.6 oz (107.1 kg)  07/23/17 259 lb 4.8 oz (117.6 kg)  07/15/17 259 lb (117.5 kg)  06/29/17 267 lb (121.1 kg)  05/31/17 256 lb (116.1 kg)  05/18/17 259 lb 9.6 oz (117.8 kg)  03/29/17 262 lb (118.8 kg)  03/05/17 262 lb 12.8 oz (119.2 kg)  03/04/17 267 lb (121.1 kg)  11/04/16 264 lb (119.7 kg)  09/22/16 262 lb (118.8 kg)  09/08/16 259 lb (117.5 kg)  07/14/16 257 lb (116.6 kg)  07/09/15 249 lb (112.9 kg)  07/03/14 263 lb 11.2 oz (119.6 kg)    Body mass index is 36.98 kg/m. Patient meets criteria for obesity based on current BMI.   Current diet order is heart healthy, patient is consuming approximately 100% of meals at this time. Labs and medications reviewed.   No nutrition interventions warranted at this time. If nutrition issues arise, please consult RD.   Joaquin Courts, RD, LDN, CNSC Pager [phone] After Hours Pager [phone]

## 2017-08-13 NOTE — Discharge Summary (Signed)
PATIENT DETAILS Name: Chad Duke Age: 72 y.o. Sex: male Date of Birth: May 04, 1945 MRN: [id number]. Admitting Physician: Lorretta Harp, MD PCP:Sun, Charyl Dancer, MD  Admit Date: 08/12/2017 Discharge date: 08/13/2017  Recommendations for Outpatient Follow-up:  1. Follow up with PCP in 1-2 weeks 2. Please obtain BMP/CBC in one week  Admitted From:  Home  Disposition: Home with home health services   Home Health:  Yes  Equipment/Devices: None  Discharge Condition: Stable  CODE STATUS: FULL CODE  Diet recommendation:  Heart Healthy / Carb Modified   Brief Summary: See H&P, Labs, Consult and Test reports for all details in brief, patient is a 72 year old male with a history of diastolic heart failure, hypertension, dyslipidemia, COPD on home O2, recently diagnosed with venous thromboembolism on Eliquis-presented with dizziness/fall likely secondary to orthostatic hypotension.  Patient was also found to be hypokalemic.  See below for further details  Brief Hospital Course: Fall/dizziness: Likely secondary to orthostatic vital signs.  CT head and C-spine was negative.  Patient had positive orthostatic vital signs in the emergency room.  Patient was cautiously hydrated-he feels much better.  Have discontinued Lasix-have asked RN to place compression stockings.  He does have lower extremity edema but this is probably secondary to chronic venous stasis rather than CHF.  Patient will need to follow-up with his primary care practitioner for further continued care.  Patient is also anxious to go home-doubt any further work-up required at this time.  He denied any syncopal episode  Acute kidney injury on chronic kidney disease stage III: Acute kidney injury likely hemodynamically mediated-probably secondary to diuretic use.  Gently hydrated with IV fluids-creatinine close to usual baseline at the time of discharge.  Please check electrolytes in 1 week.  Hypokalemia: Secondary to diuretic  use-repleted with IV and oral potassium chloride-have asked patient to follow-up with PCP in 1 week for repeat chemistry panel.  Lower extremity edema: He has no features suggestive of CHF-BNP is within normal limits-I suspect lower extremity edema is from chronic venous stasis, recent DVT.  Compression stockings will be applied.  Supportive care.  Reassess at PCPs office next week.  Venous thromboembolism: Recently diagnosed with PE/DVT: Compliant with Eliquis-denies any underlying shortness of breath-denies any syncopal episode (he actually fell and never passed out)-continue with Eliquis.   Rheumatoid arthritis: Continue Remicade and methotrexate.  Continue to watch renal function closely.  Chronic hypoxic respiratory failure: Continue home O2.  COPD: Stable without any exacerbation-continue usual bronchodilator regimen.  Procedures/Studies: None  Discharge Diagnoses:  Principal Problem:   Fall Active Problems:   COPD GOLD III    Rheumatoid arthritis (HCC)   Cigarette smoker   Pulmonary embolus (HCC)   Chronic diastolic CHF (congestive heart failure) (HCC)   Deep vein thrombosis (DVT) of both lower extremities (HCC)   Acute renal failure superimposed on stage 3 chronic kidney disease (HCC)   HLD (hyperlipidemia)   Essential hypertension   GERD (gastroesophageal reflux disease)   Orthostatic hypotension   Discharge Instructions:  Activity:  As tolerated with Full fall precautions use walker/cane & assistance as needed   Discharge Instructions    Call MD for:  difficulty breathing, headache or visual disturbances   Complete by:  As directed    Call MD for:  persistant nausea and vomiting   Complete by:  As directed    Diet - low sodium heart healthy   Complete by:  As directed    Diet Carb Modified   Complete by:  As directed  Discharge instructions   Complete by:  As directed    Follow with Primary MD  [name], MD in 1 week  Please get a complete blood count and  chemistry panel checked by your Primary MD at your next visit, and again as instructed by your Primary MD.  Get Medicines reviewed and adjusted: Please take all your medications with you for your next visit with your Primary MD  Laboratory/radiological data: Please request your Primary MD to go over all hospital tests and procedure/radiological results at the follow up, please ask your Primary MD to get all Hospital records sent to his/her office.  In some cases, they will be blood work, cultures and biopsy results pending at the time of your discharge. Please request that your primary care M.D. follows up on these results.  Also Note the following: If you experience worsening of your admission symptoms, develop shortness of breath, life threatening emergency, suicidal or homicidal thoughts you must seek medical attention immediately by calling 911 or calling your MD immediately  if symptoms less severe.  You must read complete instructions/literature along with all the possible adverse reactions/side effects for all the Medicines you take and that have been prescribed to you. Take any new Medicines after you have completely understood and accpet all the possible adverse reactions/side effects.   Do not drive when taking Pain medications or sleeping medications (Benzodaizepines)  Do not take more than prescribed Pain, Sleep and Anxiety Medications. It is not advisable to combine anxiety,sleep and pain medications without talking with your primary care practitioner  Special Instructions: If you have smoked or chewed Tobacco  in the last 2 yrs please stop smoking, stop any regular Alcohol  and or any Recreational drug use.  Wear Seat belts while driving.  Please note: You were cared for by a hospitalist during your hospital stay. Once you are discharged, your primary care physician will handle any further medical issues. Please note that NO REFILLS for any discharge medications will be  authorized once you are discharged, as it is imperative that you return to your primary care physician (or establish a relationship with a primary care physician if you do not have one) for your post hospital discharge needs so that they can reassess your need for medications and monitor your lab values.   Increase activity slowly   Complete by:  As directed      Allergies as of 08/13/2017      Reactions   Chantix [varenicline] Other (See Comments)   Suicidal thoughts      Medication List    STOP taking these medications   furosemide 20 MG tablet Commonly known as:  LASIX     TAKE these medications   albuterol 108 (90 Base) MCG/ACT inhaler Commonly known as:  PROVENTIL HFA;VENTOLIN HFA Inhale 2 puffs into the lungs every 4 (four) hours as needed for wheezing or shortness of breath.   apixaban 5 MG Tabs tablet Commonly known as:  ELIQUIS Take two-5mg  tablets (total 10mg ) twice daily for 7 days.  On day #8, starting taking one-5mg  tablet twice daily What changed:    how much to take  how to take this  when to take this  additional instructions   aspirin EC 81 MG tablet Take 81 mg by mouth daily.   atorvastatin 40 MG tablet Commonly known as:  LIPITOR Take 1 tablet (40 mg total) by mouth daily.   cetirizine 10 MG tablet Commonly known as:  ZYRTEC Take 10 mg  by mouth daily.   CITRACAL CALCIUM+D 600-40-500 MG-MG-UNIT Tb24 Generic drug:  Calcium-Magnesium-Vitamin D Take 1 tablet by mouth daily.   cyanocobalamin 2000 MCG tablet Take 2,500 mcg by mouth daily.   febuxostat 40 MG tablet Commonly known as:  ULORIC Take 40 mg by mouth daily.   FLUTTER Devi Use as directed   folic acid 400 MCG tablet Commonly known as:  FOLVITE Take 1,200 mcg by mouth daily.   guaiFENesin 600 MG 12 hr tablet Commonly known as:  MUCINEX Take 1,200 mg by mouth 2 (two) times daily.   ipratropium-albuterol 0.5-2.5 (3) MG/3ML Soln Commonly known as:  DUONEB Take 3 mLs by  nebulization every 6 (six) hours.   metFORMIN 500 MG tablet Commonly known as:  GLUCOPHAGE Take 1 tablet (500 mg total) by mouth 2 (two) times daily with a meal.   methotrexate 2.5 MG tablet Commonly known as:  RHEUMATREX Take 10 mg by mouth once a week. On Mondays   mometasone-formoterol 200-5 MCG/ACT Aero Commonly known as:  DULERA Inhale 2 puffs into the lungs 2 (two) times daily.   multivitamin tablet Take 1 tablet by mouth daily.   nicotine 14 mg/24hr patch Commonly known as:  NICODERM CQ - dosed in mg/24 hours Place 1 patch (14 mg total) onto the skin daily.   OXYGEN BIPAP with 1lpm o2   pantoprazole 40 MG tablet Commonly known as:  PROTONIX Take 40 mg by mouth 2 (two) times daily.   predniSONE 10 MG tablet Commonly known as:  DELTASONE Take 5 tablets for 4 days; Take 4 tablets for 3 days; then take 3 tablets daily. What changed:    how much to take  how to take this  when to take this  additional instructions   REMICADE IV Inject into the vein every 6 (six) weeks.   Tiotropium Bromide Monohydrate 2.5 MCG/ACT Aers Commonly known as:  SPIRIVA RESPIMAT Inhale 2 puffs into the lungs daily.   vitamin C 1000 MG tablet Take 1,000 mg by mouth daily.      Follow-up Information    [name], MD. Schedule an appointment as soon as possible for a visit in 1 week(s).   Specialty:  Internal Medicine Contact information: [address] [place] [address] [phone]          Allergies  Allergen Reactions  . Chantix [Varenicline] Other (See Comments)    Suicidal thoughts    Consultations:  None  Other Procedures/Studies: Dg Chest 2 View  Result Date: 08/12/2017 CLINICAL DATA:  72 y/o  M; fall and generalized weakness. EXAM: CHEST - 2 VIEW COMPARISON:  07/20/2017 CT angiogram of the chest. 07/19/2017 chest radiograph. FINDINGS: Stable heart size and mediastinal contours are within normal limits. Both lungs are clear. Partially visualized  left shoulder arthroplasty and degenerative changes. No acute osseous abnormality is evident. IMPRESSION: No acute pulmonary process identified. Electronically Signed   By: Mitzi Hansen M.D.   On: 08/12/2017 23:35   Dg Chest 2 View  Result Date: 07/19/2017 CLINICAL DATA:  Shortness of breath EXAM: CHEST - 2 VIEW COMPARISON:  03/04/2017 chest radiograph 04/05/2017 chest CT FINDINGS: The heart size and mediastinal contours are within normal limits. Both lungs are clear. The visualized skeletal structures are unremarkable. IMPRESSION: No active cardiopulmonary disease. Electronically Signed   By: Deatra Robinson M.D.   On: 07/19/2017 17:44   Ct Head Wo Contrast  Result Date: 08/12/2017 CLINICAL DATA:  Syncopal episode, hit head on floor EXAM: CT HEAD WITHOUT CONTRAST  CT CERVICAL SPINE WITHOUT CONTRAST TECHNIQUE: Multidetector CT imaging of the head and cervical spine was performed following the standard protocol without intravenous contrast. Multiplanar CT image reconstructions of the cervical spine were also generated. COMPARISON:  Report 02/26/2007 FINDINGS: CT HEAD FINDINGS Brain: No acute territorial infarction, hemorrhage or intracranial mass. Moderate atrophy. Prominent ventricles, felt secondary to atrophy. Vascular: No hyperdense vessels. Scattered calcifications at the carotid siphons. Skull: Normal. Negative for fracture or focal lesion. Sinuses/Orbits: No acute finding. Patchy opacification of the ethmoid sinuses. Other: None CT CERVICAL SPINE FINDINGS Alignment: Reversal of cervical lordosis. No subluxation. Facet alignment within normal limits. Skull base and vertebrae: No acute fracture. No primary bone lesion or focal pathologic process. Soft tissues and spinal canal: No prevertebral fluid or swelling. No visible canal hematoma. Disc levels: Moderate degenerative changes at C5-C6. Mild degenerative changes at C3-C4 and C4-C5. Upper chest: Negative. Other: None IMPRESSION: 1. No CT  evidence for acute intracranial abnormality.  Atrophy. 2. Reversal of cervical lordosis.  No acute osseous abnormality. Electronically Signed   By: Jasmine Pang M.D.   On: 08/12/2017 23:23   Ct Angio Chest Pe W Or Wo Contrast  Result Date: 07/20/2017 CLINICAL DATA:  Shortness of breath and lower extremity edema for several days EXAM: CT ANGIOGRAPHY CHEST WITH CONTRAST TECHNIQUE: Multidetector CT imaging of the chest was performed using the standard protocol during bolus administration of intravenous contrast. Multiplanar CT image reconstructions and MIPs were obtained to evaluate the vascular anatomy. Examination limited by scattered respiratory motion artifacts. CONTRAST:  ISOVUE-370 IOPAMIDOL (ISOVUE-370) INJECTION 76% IV COMPARISON:  04/05/2017 noncontrast CT chest FINDINGS: Cardiovascular: Aorta normal caliber without aneurysm or dissection. Atherosclerotic calcifications aorta, coronary arteries and proximal great vessels. Pulmonary arteries adequately opacified. Filling defects identified within LEFT lower lobe pulmonary arteries consistent with pulmonary embolism. No additional pulmonary emboli identified. RV/LV ratio = 0.89. No pericardial effusion. Mediastinum/Nodes: Esophagus unremarkable. Base of cervical region normal appearance. No adenopathy. Lungs/Pleura: Respiratory motion artifacts. Central peribronchial thickening. No pulmonary infiltrate, pleural effusion or pneumothorax. Minimal subsegmental atelectasis in the upper lobes and lingula. Upper Abdomen: Stomach decompressed, wall appears thickened though this could be an artifact from underdistention. Nonobstructing calculi at upper poles of both kidneys. Small LEFT renal cyst. Musculoskeletal: Bones demineralized.  LEFT shoulder prosthesis. Review of the MIP images confirms the above findings. IMPRESSION: LEFT lower lobe pulmonary emboli. Upper normal RV/LV ratio of 0.89. Nonobstructing BILATERAL renal calculi. Aortic Atherosclerosis  (ICD10-I70.0). Critical Value/emergent results were called by telephone at the time of interpretation on 07/20/2017 at 1508 hrs to Dr. Marguerita Merles, who verbally acknowledged these results. Electronically Signed   By: Ulyses Southward M.D.   On: 07/20/2017 15:08   Ct Cervical Spine Wo Contrast  Result Date: 08/12/2017 CLINICAL DATA:  Syncopal episode, hit head on floor EXAM: CT HEAD WITHOUT CONTRAST CT CERVICAL SPINE WITHOUT CONTRAST TECHNIQUE: Multidetector CT imaging of the head and cervical spine was performed following the standard protocol without intravenous contrast. Multiplanar CT image reconstructions of the cervical spine were also generated. COMPARISON:  Report 02/26/2007 FINDINGS: CT HEAD FINDINGS Brain: No acute territorial infarction, hemorrhage or intracranial mass. Moderate atrophy. Prominent ventricles, felt secondary to atrophy. Vascular: No hyperdense vessels. Scattered calcifications at the carotid siphons. Skull: Normal. Negative for fracture or focal lesion. Sinuses/Orbits: No acute finding. Patchy opacification of the ethmoid sinuses. Other: None CT CERVICAL SPINE FINDINGS Alignment: Reversal of cervical lordosis. No subluxation. Facet alignment within normal limits. Skull base and vertebrae: No acute fracture. No  primary bone lesion or focal pathologic process. Soft tissues and spinal canal: No prevertebral fluid or swelling. No visible canal hematoma. Disc levels: Moderate degenerative changes at C5-C6. Mild degenerative changes at C3-C4 and C4-C5. Upper chest: Negative. Other: None IMPRESSION: 1. No CT evidence for acute intracranial abnormality.  Atrophy. 2. Reversal of cervical lordosis.  No acute osseous abnormality. Electronically Signed   By: Jasmine Pang M.D.   On: 08/12/2017 23:23      TODAY-DAY OF DISCHARGE:  Subjective:   Rumeal Cullipher today has no headache,no chest abdominal pain,no new weakness tingling or numbness, feels much better wants to go home today.   Objective:    Blood pressure (!) 88/62, pulse (!) 117, temperature 98.2 F (36.8 C), temperature source Oral, resp. rate 20, height 5\' 7"  (1.702 m), weight 107.1 kg (236 lb 1.6 oz), SpO2 98 %.  Intake/Output Summary (Last 24 hours) at 08/13/2017 1520 Last data filed at 08/13/2017 1100 Gross per 24 hour  Intake 1140 ml  Output 175 ml  Net 965 ml   Filed Weights   08/12/17 2225 08/13/17 0433  Weight: 117.5 kg (259 lb) 107.1 kg (236 lb 1.6 oz)    Exam: Awake Alert, Oriented *3, No new F.N deficits, Normal affect [place].AT,PERRAL Supple Neck,No JVD, No cervical lymphadenopathy appriciated.  Symmetrical Chest wall movement, Good air movement bilaterally, CTAB RRR,No Gallops,Rubs or new Murmurs, No Parasternal Heave +ve B.Sounds, Abd Soft, Non tender, No organomegaly appriciated, No rebound -guarding or rigidity. No Cyanosis, Clubbing or edema, No new Rash or bruise   PERTINENT RADIOLOGIC STUDIES: Dg Chest 2 View  Result Date: 08/12/2017 CLINICAL DATA:  72 y/o  M; fall and generalized weakness. EXAM: CHEST - 2 VIEW COMPARISON:  07/20/2017 CT angiogram of the chest. 07/19/2017 chest radiograph. FINDINGS: Stable heart size and mediastinal contours are within normal limits. Both lungs are clear. Partially visualized left shoulder arthroplasty and degenerative changes. No acute osseous abnormality is evident. IMPRESSION: No acute pulmonary process identified. Electronically Signed   By: 09/18/2017 M.D.   On: 08/12/2017 23:35   Dg Chest 2 View  Result Date: 07/19/2017 CLINICAL DATA:  Shortness of breath EXAM: CHEST - 2 VIEW COMPARISON:  03/04/2017 chest radiograph 04/05/2017 chest CT FINDINGS: The heart size and mediastinal contours are within normal limits. Both lungs are clear. The visualized skeletal structures are unremarkable. IMPRESSION: No active cardiopulmonary disease. Electronically Signed   By: 04/07/2017 M.D.   On: 07/19/2017 17:44   Ct Head Wo Contrast  Result Date:  08/12/2017 CLINICAL DATA:  Syncopal episode, hit head on floor EXAM: CT HEAD WITHOUT CONTRAST CT CERVICAL SPINE WITHOUT CONTRAST TECHNIQUE: Multidetector CT imaging of the head and cervical spine was performed following the standard protocol without intravenous contrast. Multiplanar CT image reconstructions of the cervical spine were also generated. COMPARISON:  Report 02/26/2007 FINDINGS: CT HEAD FINDINGS Brain: No acute territorial infarction, hemorrhage or intracranial mass. Moderate atrophy. Prominent ventricles, felt secondary to atrophy. Vascular: No hyperdense vessels. Scattered calcifications at the carotid siphons. Skull: Normal. Negative for fracture or focal lesion. Sinuses/Orbits: No acute finding. Patchy opacification of the ethmoid sinuses. Other: None CT CERVICAL SPINE FINDINGS Alignment: Reversal of cervical lordosis. No subluxation. Facet alignment within normal limits. Skull base and vertebrae: No acute fracture. No primary bone lesion or focal pathologic process. Soft tissues and spinal canal: No prevertebral fluid or swelling. No visible canal hematoma. Disc levels: Moderate degenerative changes at C5-C6. Mild degenerative changes at C3-C4 and C4-C5. Upper chest: Negative.  Other: None IMPRESSION: 1. No CT evidence for acute intracranial abnormality.  Atrophy. 2. Reversal of cervical lordosis.  No acute osseous abnormality. Electronically Signed   By: Jasmine Pang M.D.   On: 08/12/2017 23:23   Ct Angio Chest Pe W Or Wo Contrast  Result Date: 07/20/2017 CLINICAL DATA:  Shortness of breath and lower extremity edema for several days EXAM: CT ANGIOGRAPHY CHEST WITH CONTRAST TECHNIQUE: Multidetector CT imaging of the chest was performed using the standard protocol during bolus administration of intravenous contrast. Multiplanar CT image reconstructions and MIPs were obtained to evaluate the vascular anatomy. Examination limited by scattered respiratory motion artifacts. CONTRAST:  ISOVUE-370  IOPAMIDOL (ISOVUE-370) INJECTION 76% IV COMPARISON:  04/05/2017 noncontrast CT chest FINDINGS: Cardiovascular: Aorta normal caliber without aneurysm or dissection. Atherosclerotic calcifications aorta, coronary arteries and proximal great vessels. Pulmonary arteries adequately opacified. Filling defects identified within LEFT lower lobe pulmonary arteries consistent with pulmonary embolism. No additional pulmonary emboli identified. RV/LV ratio = 0.89. No pericardial effusion. Mediastinum/Nodes: Esophagus unremarkable. Base of cervical region normal appearance. No adenopathy. Lungs/Pleura: Respiratory motion artifacts. Central peribronchial thickening. No pulmonary infiltrate, pleural effusion or pneumothorax. Minimal subsegmental atelectasis in the upper lobes and lingula. Upper Abdomen: Stomach decompressed, wall appears thickened though this could be an artifact from underdistention. Nonobstructing calculi at upper poles of both kidneys. Small LEFT renal cyst. Musculoskeletal: Bones demineralized.  LEFT shoulder prosthesis. Review of the MIP images confirms the above findings. IMPRESSION: LEFT lower lobe pulmonary emboli. Upper normal RV/LV ratio of 0.89. Nonobstructing BILATERAL renal calculi. Aortic Atherosclerosis (ICD10-I70.0). Critical Value/emergent results were called by telephone at the time of interpretation on 07/20/2017 at 1508 hrs to Dr. Marguerita Merles, who verbally acknowledged these results. Electronically Signed   By: Ulyses Southward M.D.   On: 07/20/2017 15:08   Ct Cervical Spine Wo Contrast  Result Date: 08/12/2017 CLINICAL DATA:  Syncopal episode, hit head on floor EXAM: CT HEAD WITHOUT CONTRAST CT CERVICAL SPINE WITHOUT CONTRAST TECHNIQUE: Multidetector CT imaging of the head and cervical spine was performed following the standard protocol without intravenous contrast. Multiplanar CT image reconstructions of the cervical spine were also generated. COMPARISON:  Report 02/26/2007 FINDINGS: CT HEAD  FINDINGS Brain: No acute territorial infarction, hemorrhage or intracranial mass. Moderate atrophy. Prominent ventricles, felt secondary to atrophy. Vascular: No hyperdense vessels. Scattered calcifications at the carotid siphons. Skull: Normal. Negative for fracture or focal lesion. Sinuses/Orbits: No acute finding. Patchy opacification of the ethmoid sinuses. Other: None CT CERVICAL SPINE FINDINGS Alignment: Reversal of cervical lordosis. No subluxation. Facet alignment within normal limits. Skull base and vertebrae: No acute fracture. No primary bone lesion or focal pathologic process. Soft tissues and spinal canal: No prevertebral fluid or swelling. No visible canal hematoma. Disc levels: Moderate degenerative changes at C5-C6. Mild degenerative changes at C3-C4 and C4-C5. Upper chest: Negative. Other: None IMPRESSION: 1. No CT evidence for acute intracranial abnormality.  Atrophy. 2. Reversal of cervical lordosis.  No acute osseous abnormality. Electronically Signed   By: Jasmine Pang M.D.   On: 08/12/2017 23:23     PERTINENT LAB RESULTS: CBC: Recent Labs    08/12/17 2238 08/13/17 0402  WBC 8.0 9.5  HGB 12.4* 12.5*  HCT 37.1* 36.9*  PLT 143* 155   CMET CMP     Component Value Date/Time   NA 133 (L) 08/13/2017 1352   K 3.2 (L) 08/13/2017 1352   CL 95 (L) 08/13/2017 1352   CO2 27 08/13/2017 1352   GLUCOSE 280 (H) 08/13/2017  1352   BUN 43 (H) 08/13/2017 1352   CREATININE 1.78 (H) 08/13/2017 1352   CALCIUM 9.1 08/13/2017 1352   PROT 6.4 (L) 07/21/2017 0155   ALBUMIN 3.2 (L) 07/21/2017 0155   AST 26 07/21/2017 0155   ALT 44 07/21/2017 0155   ALKPHOS 46 07/21/2017 0155   BILITOT 0.5 07/21/2017 0155   GFRNONAA 36 (L) 08/13/2017 1352   GFRAA 42 (L) 08/13/2017 1352    GFR Estimated Creatinine Clearance: 43.8 mL/min (A) (by C-G formula based on SCr of 1.78 mg/dL (H)). No results for input(s): LIPASE, AMYLASE in the last 72 hours. No results for input(s): CKTOTAL, CKMB, CKMBINDEX,  TROPONINI in the last 72 hours. Invalid input(s): POCBNP No results for input(s): DDIMER in the last 72 hours. No results for input(s): HGBA1C in the last 72 hours. No results for input(s): CHOL, HDL, LDLCALC, TRIG, CHOLHDL, LDLDIRECT in the last 72 hours. No results for input(s): TSH, T4TOTAL, T3FREE, THYROIDAB in the last 72 hours.  Invalid input(s): FREET3 No results for input(s): VITAMINB12, FOLATE, FERRITIN, TIBC, IRON, RETICCTPCT in the last 72 hours. Coags: No results for input(s): INR in the last 72 hours.  Invalid input(s): PT Microbiology: No results found for this or any previous visit (from the past 240 hour(s)).  FURTHER DISCHARGE INSTRUCTIONS:  Get Medicines reviewed and adjusted: Please take all your medications with you for your next visit with your Primary MD  Laboratory/radiological data: Please request your Primary MD to go over all hospital tests and procedure/radiological results at the follow up, please ask your Primary MD to get all Hospital records sent to his/her office.  In some cases, they will be blood work, cultures and biopsy results pending at the time of your discharge. Please request that your primary care M.D. goes through all the records of your hospital data and follows up on these results.  Also Note the following: If you experience worsening of your admission symptoms, develop shortness of breath, life threatening emergency, suicidal or homicidal thoughts you must seek medical attention immediately by calling 911 or calling your MD immediately  if symptoms less severe.  You must read complete instructions/literature along with all the possible adverse reactions/side effects for all the Medicines you take and that have been prescribed to you. Take any new Medicines after you have completely understood and accpet all the possible adverse reactions/side effects.   Do not drive when taking Pain medications or sleeping medications  (Benzodaizepines)  Do not take more than prescribed Pain, Sleep and Anxiety Medications. It is not advisable to combine anxiety,sleep and pain medications without talking with your primary care practitioner  Special Instructions: If you have smoked or chewed Tobacco  in the last 2 yrs please stop smoking, stop any regular Alcohol  and or any Recreational drug use.  Wear Seat belts while driving.  Please note: You were cared for by a hospitalist during your hospital stay. Once you are discharged, your primary care physician will handle any further medical issues. Please note that NO REFILLS for any discharge medications will be authorized once you are discharged, as it is imperative that you return to your primary care physician (or establish a relationship with a primary care physician if you do not have one) for your post hospital discharge needs so that they can reassess your need for medications and monitor your lab values.  Total Time spent coordinating discharge including counseling, education and face to face time equals 45 minutes.  Signed: Lyndle Pang 08/13/2017 3:20  PM

## 2017-08-13 NOTE — Evaluation (Addendum)
Occupational Therapy Evaluation Patient Details Name: Chad Duke MRN: [id number] DOB: [birth year] Today's Date: 08/13/2017    History of Present Illness 72 y.o. male with medical history significant of dCHF, hypertension, hyperlipidemia, diabetes mellitus, COPD on 2 L nasal cannula oxygen, GERD, gout, rheumatoid arthritis, DVT/PE on Eliquis, AAA, CKD-3, varicose vein, OSA on BiPAP, who presents with fall, dizziness and lightheadedness. Pt reports he hit his head but had no LOC. Of note pt also with recent hospital admission on 6/10 for COPD exacerbation.    Clinical Impression   This 72 y/o male presents with the above. Pt lives alone, at baseline reports ambulating independently with occasional use of SPC; reports he receives assist for bathing ADLs and some LB dressing. Prior to pt's hospital admission in June pt was working and completing [place]. Prior to this admission pt was receiving [facility] therapy services. Pt presenting with generalized weakness and decreased activity tolerance. Pt demonstrating room level functional mobility without AD and overall minguard assist. Pt requiring overall minA for toileting and LB ADLs. Pt with increased dyspnea with room level activity, on 2L O2 during session and SpO2 remaining above 90%. Pt will benefit from continued OT services while in acute setting and recommend continuation of [facility] therapy services after discharge home to maximize his safety and independence with ADLs and mobility.   BP monitored during session: start of session sitting EOB: 95/61, immediately after standing: 78/67, after 1-2 min standing and taking steps in place: 100/78.     Follow Up Recommendations  Home health OT;Supervision - Intermittent    Equipment Recommendations  None recommended by OT;Other (comment)(pt's DME needs are met )           Precautions / Restrictions Precautions Precautions: Fall Precaution Comments: watch orthostats  Restrictions Weight Bearing Restrictions:  No      Mobility Bed Mobility               General bed mobility comments: sitting EOB upon arrival   Transfers Overall transfer level: Needs assistance   Transfers: Sit to/from Stand Sit to Stand: Mod assist;Min guard         General transfer comment: minguard from EOB for safety, no physical assist required; modA to boost into standing from lower surface toilet height    Balance Overall balance assessment: Mild deficits observed, not formally tested                                         ADL either performed or assessed with clinical judgement   ADL Overall ADL's : Needs assistance/impaired Eating/Feeding: Independent;Sitting   Grooming: Set up;Sitting   Upper Body Bathing: Min guard;Sitting   Lower Body Bathing: Minimal assistance;Sit to/from stand   Upper Body Dressing : Min guard;Sitting   Lower Body Dressing: Minimal assistance;Sit to/from stand   Toilet Transfer: Moderate assistance;Ambulation;Grab Information systems manager Details (indicate cue type and reason): minguard assist to ambulate to bathroom; pt requiring modA for sit<>stand from regular toilet due to lower surface height (pt has toilet riser at home)  Toileting- Water quality scientist and Hygiene: Min guard;Minimal assistance;Sit to/from stand Toileting - Clothing Manipulation Details (indicate cue type and reason): pt completing clothing management with minguard-minA for static standing balance      Functional mobility during ADLs: Min guard General ADL Comments: pt completing room level ambulation and toileting task during session; requires seated rest break after standing  for taking BP prior to ambulation; pt with increased fatigue and dyspnea with room level activity                          Pertinent Vitals/Pain Pain Assessment: No/denies pain     Hand Dominance Right   Extremity/Trunk Assessment Upper Extremity Assessment Upper Extremity  Assessment: Generalized weakness   Lower Extremity Assessment Lower Extremity Assessment: Defer to PT evaluation       Communication Communication Communication: HOH   Cognition Arousal/Alertness: Awake/alert Behavior During Therapy: WFL for tasks assessed/performed Overall Cognitive Status: Within Functional Limits for tasks assessed                                     General Comments  Pt on 2L O2 during session, 2/4 dyspnea with room level activity, SpO2 remaining above 90%; Pt denies dizziness/lightheadedness during session. BP taken during session: sitting EOB 95/61, immediately after standing 78/67, after standing approx 1-2 min and taking steps in place 100/78. Educated on safety with mobility at home to reduce risk of falls                Home Living Family/patient expects to be discharged to:: Private residence Living Arrangements: Alone   Type of Home: Apartment Home Access: Level entry;Stairs to enter Entrance Stairs-Number of Steps: one step into [place]: One level     Bathroom Shower/Tub: Teacher, early years/pre: [place]: Toilet riser;Cane - single point;Walker - 4 wheels;Shower seat          Prior Functioning/Environment Level of Independence: Needs assistance  Gait / Transfers Assistance Needed: overall independent, occasional use of SPC when he feels "wobbly"  ADL's / Homemaking Assistance Needed: assist for bathing 2x/week, assist for donning compression socks   Comments: completes grcoery shopping (uses electric cart), driving, works as an Therapist, 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); was reciving [place] therapy services prior to this admission         OT Problem List: Decreased strength;Impaired balance (sitting and/or standing);Decreased activity tolerance      OT Treatment/Interventions: Self-care/ADL training;DME and/or AE instruction;Therapeutic activities;Balance  training;Therapeutic exercise;Patient/family education;Energy conservation    OT Goals(Current goals can be found in the care plan section) Acute Rehab OT Goals Patient Stated Goal: return to work; regain independence  OT Goal Formulation: With patient Time For Goal Achievement: 08/27/17 Potential to Achieve Goals: Good  OT Frequency: Min 2X/week    AM-PAC PT "6 Clicks" Daily Activity     Outcome Measure Help from another person eating meals?: None Help from another person taking care of personal grooming?: A Little Help from another person toileting, which includes using toliet, bedpan, or urinal?: A Little Help from another person bathing (including washing, rinsing, drying)?: A Little Help from another person to put on and taking off regular upper body clothing?: None Help from another person to put on and taking off regular lower body clothing?: A Little 6 Click Score: 20   End of Session Equipment Utilized During Treatment: Gait belt;Oxygen Nurse Communication: Mobility status  Activity Tolerance: Patient tolerated treatment well Patient left: in chair;with call bell/phone within reach;with chair alarm set;with nursing/sitter in room  OT Visit Diagnosis: Muscle weakness (generalized) (M62.81);History of falling (Z91.81)  Time: 0824-0900 OT Time Calculation (min): 36 min Charges:  OT General Charges $OT Visit: 1 Visit OT Evaluation $OT Eval Moderate Complexity: 1 Mod OT Treatments $Self Care/Home Management : 8-22 mins G-Codes:     Lou Cal, OT Pager [phone] 08/13/2017   Chad Duke 08/13/2017, 9:20 AM

## 2017-08-13 NOTE — Care Management Note (Addendum)
Case Management Note  Patient Details  Name: Chad Duke MRN: [id number] Date of Birth: 1945-04-08  Subjective/Objective:    Fall, dizziness, AKI                Action/Plan: NCM spoke to pt and he has oxygen at home from [facility]. Pt active with [facility]. Notified [facility] rep of dc home today with resumption of care.   Expected Discharge Date:  08/13/17               Expected Discharge Plan:  Home w Home Health Services  In-House Referral:  NA  Discharge planning Services  CM Consult  Post Acute Care Choice:  Home Health, Resumption of Svcs/PTA Provider Choice offered to:  Patient  DME Arranged:  N/A DME Agency:  NA  HH Arranged:  RN, PT, OT, aide HH Agency:  [facility] (formerly [facility]  Status of Service:  Completed, signed off  If discussed at [employer], dates discussed:    Additional Comments:  Elliot Cousin, RN 08/13/2017, 4:15 PM

## 2017-08-15 ENCOUNTER — Other Ambulatory Visit: Payer: Self-pay | Admitting: Cardiology

## 2017-08-15 DIAGNOSIS — J439 Emphysema, unspecified: Secondary | ICD-10-CM | POA: Diagnosis not present

## 2017-08-15 DIAGNOSIS — E1122 Type 2 diabetes mellitus with diabetic chronic kidney disease: Secondary | ICD-10-CM | POA: Diagnosis not present

## 2017-08-15 DIAGNOSIS — I5031 Acute diastolic (congestive) heart failure: Secondary | ICD-10-CM | POA: Diagnosis not present

## 2017-08-15 DIAGNOSIS — S81811D Laceration without foreign body, right lower leg, subsequent encounter: Secondary | ICD-10-CM | POA: Diagnosis not present

## 2017-08-15 DIAGNOSIS — S81812D Laceration without foreign body, left lower leg, subsequent encounter: Secondary | ICD-10-CM | POA: Diagnosis not present

## 2017-08-15 DIAGNOSIS — I130 Hypertensive heart and chronic kidney disease with heart failure and stage 1 through stage 4 chronic kidney disease, or unspecified chronic kidney disease: Secondary | ICD-10-CM | POA: Diagnosis not present

## 2017-08-16 DIAGNOSIS — I10 Essential (primary) hypertension: Secondary | ICD-10-CM | POA: Diagnosis not present

## 2017-08-16 DIAGNOSIS — J449 Chronic obstructive pulmonary disease, unspecified: Secondary | ICD-10-CM | POA: Diagnosis not present

## 2017-08-16 DIAGNOSIS — I2699 Other pulmonary embolism without acute cor pulmonale: Secondary | ICD-10-CM | POA: Diagnosis not present

## 2017-08-16 DIAGNOSIS — R7303 Prediabetes: Secondary | ICD-10-CM | POA: Diagnosis not present

## 2017-08-17 ENCOUNTER — Ambulatory Visit: Payer: Medicare Other | Admitting: Internal Medicine

## 2017-08-17 ENCOUNTER — Encounter (HOSPITAL_COMMUNITY): Payer: Self-pay | Admitting: Emergency Medicine

## 2017-08-17 ENCOUNTER — Emergency Department (HOSPITAL_COMMUNITY)
Admission: EM | Admit: 2017-08-17 | Discharge: 2017-08-17 | Disposition: A | Discharge status: Home / Self Care | Payer: Medicare Other | Attending: Emergency Medicine | Admitting: Emergency Medicine

## 2017-08-17 ENCOUNTER — Emergency Department (HOSPITAL_COMMUNITY): Payer: Medicare Other

## 2017-08-17 DIAGNOSIS — S80211A Abrasion, right knee, initial encounter: Secondary | ICD-10-CM | POA: Insufficient documentation

## 2017-08-17 DIAGNOSIS — I959 Hypotension, unspecified: Secondary | ICD-10-CM | POA: Diagnosis not present

## 2017-08-17 DIAGNOSIS — J449 Chronic obstructive pulmonary disease, unspecified: Secondary | ICD-10-CM | POA: Diagnosis not present

## 2017-08-17 DIAGNOSIS — S299XXA Unspecified injury of thorax, initial encounter: Secondary | ICD-10-CM | POA: Diagnosis not present

## 2017-08-17 DIAGNOSIS — S81811D Laceration without foreign body, right lower leg, subsequent encounter: Secondary | ICD-10-CM | POA: Diagnosis not present

## 2017-08-17 DIAGNOSIS — N183 Chronic kidney disease, stage 3 (moderate): Secondary | ICD-10-CM | POA: Insufficient documentation

## 2017-08-17 DIAGNOSIS — Y92481 Parking lot as the place of occurrence of the external cause: Secondary | ICD-10-CM | POA: Insufficient documentation

## 2017-08-17 DIAGNOSIS — W101XXA Fall (on)(from) sidewalk curb, initial encounter: Secondary | ICD-10-CM | POA: Insufficient documentation

## 2017-08-17 DIAGNOSIS — J439 Emphysema, unspecified: Secondary | ICD-10-CM | POA: Diagnosis not present

## 2017-08-17 DIAGNOSIS — T07XXXA Unspecified multiple injuries, initial encounter: Secondary | ICD-10-CM

## 2017-08-17 DIAGNOSIS — Y9301 Activity, walking, marching and hiking: Secondary | ICD-10-CM | POA: Diagnosis not present

## 2017-08-17 DIAGNOSIS — I130 Hypertensive heart and chronic kidney disease with heart failure and stage 1 through stage 4 chronic kidney disease, or unspecified chronic kidney disease: Secondary | ICD-10-CM | POA: Insufficient documentation

## 2017-08-17 DIAGNOSIS — S8001XA Contusion of right knee, initial encounter: Secondary | ICD-10-CM | POA: Diagnosis not present

## 2017-08-17 DIAGNOSIS — S0083XA Contusion of other part of head, initial encounter: Secondary | ICD-10-CM | POA: Diagnosis not present

## 2017-08-17 DIAGNOSIS — S0990XA Unspecified injury of head, initial encounter: Secondary | ICD-10-CM

## 2017-08-17 DIAGNOSIS — W19XXXA Unspecified fall, initial encounter: Secondary | ICD-10-CM | POA: Diagnosis not present

## 2017-08-17 DIAGNOSIS — Z23 Encounter for immunization: Secondary | ICD-10-CM | POA: Insufficient documentation

## 2017-08-17 DIAGNOSIS — S199XXA Unspecified injury of neck, initial encounter: Secondary | ICD-10-CM | POA: Diagnosis not present

## 2017-08-17 DIAGNOSIS — I5032 Chronic diastolic (congestive) heart failure: Secondary | ICD-10-CM | POA: Insufficient documentation

## 2017-08-17 DIAGNOSIS — S2231XA Fracture of one rib, right side, initial encounter for closed fracture: Secondary | ICD-10-CM | POA: Diagnosis not present

## 2017-08-17 DIAGNOSIS — S0993XA Unspecified injury of face, initial encounter: Secondary | ICD-10-CM | POA: Diagnosis not present

## 2017-08-17 DIAGNOSIS — S81812D Laceration without foreign body, left lower leg, subsequent encounter: Secondary | ICD-10-CM | POA: Diagnosis not present

## 2017-08-17 DIAGNOSIS — I5031 Acute diastolic (congestive) heart failure: Secondary | ICD-10-CM | POA: Diagnosis not present

## 2017-08-17 DIAGNOSIS — S40012A Contusion of left shoulder, initial encounter: Secondary | ICD-10-CM | POA: Diagnosis not present

## 2017-08-17 DIAGNOSIS — R062 Wheezing: Secondary | ICD-10-CM | POA: Diagnosis not present

## 2017-08-17 DIAGNOSIS — E1122 Type 2 diabetes mellitus with diabetic chronic kidney disease: Secondary | ICD-10-CM | POA: Diagnosis not present

## 2017-08-17 DIAGNOSIS — Y999 Unspecified external cause status: Secondary | ICD-10-CM | POA: Diagnosis not present

## 2017-08-17 DIAGNOSIS — S40212A Abrasion of left shoulder, initial encounter: Secondary | ICD-10-CM | POA: Diagnosis not present

## 2017-08-17 MED ORDER — TETANUS-DIPHTH-ACELL PERTUSSIS 5-2.5-18.5 LF-MCG/0.5 IM SUSP
0.5000 mL | Freq: Once | INTRAMUSCULAR | Status: AC
  Administered 2017-08-17: 0.5 mL via INTRAMUSCULAR
  Filled 2017-08-17: qty 0.5

## 2017-08-17 NOTE — ED Provider Notes (Signed)
[facility]-EMERGENCY DEPT Provider Note   CSN: [id number] Arrival date & time: 08/17/17  1419     History   Chief Complaint Chief Complaint  Patient presents with  . Fall    HPI Chad Duke is a 72 y.o. male.  HPI   He presents for evaluation of injuries from fall, walking his parking lot at home.  He states he tripped on the curb, and fell striking his right knee and face on the sidewalk.  He was able to get up and walk afterwards.  He denies headache, nausea, vomiting, blurred vision, weakness or dizziness.  He recently had a fall about a week ago and was evaluated through [facility] health emergency services.  He denies recent illnesses.  He uses oxygen 24/7, for COPD.  He denies recent fever, chills, cough, chest pain, abdominal or back pain.  There are no other known modifying factors.  Past Medical History:  Diagnosis Date  . AAA (abdominal aortic aneurysm) (HCC)   . Allergy    Rhinitis  . Arthritis    Rhuematoid  . Asthma   . Chronic renal insufficiency   . Diverticulosis   . Emphysema   . GERD (gastroesophageal reflux disease)   . Hearing loss of both ears   . Hiatal hernia   . Hyperlipidemia   . Hypertension   . Male hypogonadism   . Nephrolithiasis   . Nephrolithiasis   . Sleep apnea    On CPAP  . Stasis dermatitis   . Varicose veins   . Vitamin B 12 deficiency     Patient Active Problem List   Diagnosis Date Noted  . Fall 08/13/2017  . Acute renal failure superimposed on stage 3 chronic kidney disease (HCC) 08/13/2017  . HLD (hyperlipidemia) 08/13/2017  . Essential hypertension 08/13/2017  . GERD (gastroesophageal reflux disease) 08/13/2017  . Orthostatic hypotension 08/13/2017  . Deep vein thrombosis (DVT) of both lower extremities (HCC)   . Hyperglycemia   . Pulmonary embolus (HCC) 07/20/2017  . Chronic diastolic CHF (congestive heart failure) (HCC) 07/20/2017  . Bilateral leg edema 07/20/2017  . Acute exacerbation of chronic  obstructive pulmonary disease (COPD) (HCC) 07/19/2017  . 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 Cigarette smoker 09/08/2016  . Morbid obesity due to excess calories (HCC) 09/08/2016  . Pain in joint, ankle and foot 03/31/2012  . Abnormality of gait 03/31/2012  . Rheumatoid arthritis (HCC) 03/31/2012  . COPD GOLD III  02/12/2011  . Sleep apnea 02/12/2011    Past Surgical History:  Procedure Laterality Date  . CARPAL TUNNEL RELEASE     bilateral  . FRACTURE SURGERY Left    Fibula  . JOINT REPLACEMENT Left    Shoulder  . JOINT REPLACEMENT Left    Hip   . KIDNEY STONE SURGERY  2003  . left hip replacement    . left knee    . left shoulder    . LITHOTRIPSY          Home Medications    Prior to Admission medications   Medication Sig Start Date End Date Taking? Authorizing Provider  albuterol (PROVENTIL HFA;VENTOLIN HFA) 108 (90 Base) MCG/ACT inhaler Inhale 2 puffs into the lungs every 4 (four) hours as needed for wheezing or shortness of breath. 07/30/17  Yes Nyoka Cowden, MD  apixaban (ELIQUIS) 5 MG TABS tablet Take two-5mg  tablets (total 10mg ) twice daily for 7 days.  On day #8, starting taking one-5mg  tablet twice daily Patient taking differently: Take  5 mg by mouth 2 (two) times daily.  07/23/17  Yes Randel Pigg, Dorma Russell, MD  Ascorbic Acid (VITAMIN C) 1000 MG tablet Take 1,000 mg by mouth daily.   Yes [provider]  aspirin EC 81 MG tablet Take 81 mg by mouth daily.   Yes [provider]  atorvastatin (LIPITOR) 40 MG tablet Take 1 tablet (40 mg total) by mouth daily. 05/18/17 08/17/17 Yes Lewayne Bunting, MD  Calcium-Magnesium-Vitamin D (CITRACAL CALCIUM+D) 600-40-500 MG-MG-UNIT TB24 Take 1 tablet by mouth daily.     Yes [provider]  cetirizine (ZYRTEC) 10 MG tablet Take 10 mg by mouth daily.     Yes [provider]  cyanocobalamin 2000 MCG tablet Take 2,500 mcg by mouth daily.    Yes [provider]  febuxostat (ULORIC) 40  MG tablet Take 40 mg by mouth daily.   Yes [provider]  folic acid (FOLVITE) 400 MCG tablet Take 1,200 mcg by mouth daily.   Yes [provider]  guaiFENesin (MUCINEX) 600 MG 12 hr tablet Take 1,200 mg by mouth 2 (two) times daily.   Yes [provider]  InFLIXimab (REMICADE IV) Inject into the vein every 6 (six) weeks.   Yes [provider]  ipratropium-albuterol (DUONEB) 0.5-2.5 (3) MG/3ML SOLN Take 3 mLs by nebulization every 6 (six) hours.    Yes [provider]  metFORMIN (GLUCOPHAGE) 500 MG tablet Take 1 tablet (500 mg total) by mouth 2 (two) times daily with a meal. 07/23/17 08/22/17 Yes Randel Pigg, Dorma Russell, MD  methotrexate (RHEUMATREX) 2.5 MG tablet Take 10 mg by mouth once a week. On Mondays 02/11/11  Yes [provider]  mometasone-formoterol (DULERA) 200-5 MCG/ACT AERO Inhale 2 puffs into the lungs 2 (two) times daily. 07/23/17  Yes Lenox Ponds, MD  Multiple Vitamin (MULTIVITAMIN) tablet Take 1 tablet by mouth daily.     Yes [provider]  OXYGEN BIPAP with 1lpm o2   Yes [provider]  pantoprazole (PROTONIX) 40 MG tablet Take 40 mg by mouth 2 (two) times daily.    Yes [provider]  predniSONE (DELTASONE) 10 MG tablet Take 5 tablets for 4 days; Take 4 tablets for 3 days; then take 3 tablets daily. Patient taking differently: Take 15 mg by mouth 2 (two) times daily.  07/23/17  Yes Randel Pigg, Dorma Russell, MD  Tiotropium Bromide Monohydrate (SPIRIVA RESPIMAT) 2.5 MCG/ACT AERS Inhale 2 puffs into the lungs daily. 05/31/17  Yes Nyoka Cowden, MD  nicotine (NICODERM CQ - DOSED IN MG/24 HOURS) 14 mg/24hr patch Place 1 patch (14 mg total) onto the skin daily. Patient not taking: Reported on 08/12/2017 07/24/17   Lenox Ponds, MD  Respiratory Therapy Supplies (FLUTTER) DEVI Use as directed 11/04/16   Nyoka Cowden, MD    Family History Family History  Problem Relation Age of Onset  . Emphysema  Father   . Asthma Father   . Osteoarthritis Father   . Heart attack Mother   . Uterine cancer Mother   . Heart disease Mother   . ADD / ADHD Son   . Depression Son   . Hypertension Brother   . Heart disease Brother        Pacemaker   . Heart murmur Brother     Social History Social History   Tobacco Use  . Smoking status: Current Every Day Smoker    Packs/day: 0.10    Years: 53.00    Pack years: 5.30  Types: Cigarettes  . Smokeless tobacco: Never Used  . Tobacco comment: 1-2 cigarettes a day   Substance Use Topics  . Alcohol use: Not Currently    Alcohol/week: 0.0 oz  . Drug use: No     Allergies   Chantix [varenicline]   Review of Systems Review of Systems  All other systems reviewed and are negative.    Physical Exam Updated Vital Signs BP 114/72 (BP Location: Left Arm)   Pulse (!) 107   Temp 97.6 F (36.4 C) (Oral)   Resp 16   Ht 5\' 7"  (1.702 m)   Wt 125.2 kg (276 lb)   SpO2 96%   BMI 43.23 kg/m   Physical Exam  Constitutional: He is oriented to person, place, and time. He appears well-developed.  Overweight, elderly  HENT:  Head: Normocephalic.  Right Ear: External ear normal.  Left Ear: External ear normal.  Contusion and ecchymosis right cheek.  Zygoma nontender without crepitation.  No nasal deformity.  No visible or palpable injury to cranium or scalp.  Eyes: Pupils are equal, round, and reactive to light. Conjunctivae and EOM are normal.  Neck: Normal range of motion and phonation normal. Neck supple.  Cardiovascular: Normal rate, regular rhythm and normal heart sounds.  Pulmonary/Chest: Effort normal. No respiratory distress. He exhibits tenderness (Mild diffuse right-sided chest wall tenderness without crepitation or deformity.). He exhibits no bony tenderness.  Decreased several bilateral scattered wheezes.  No rhonchi or rales.  Abdominal: Soft. There is no tenderness.  Musculoskeletal: Normal range of motion. He exhibits no edema.    Contusion with abrasion anterior to right knee.  Knee extension normal right leg.  Decreased motion left shoulder, patient states this is chronic.  There is no associated deformity or tenderness to palpation of the left shoulder.  Neurological: He is alert and oriented to person, place, and time. No cranial nerve deficit or sensory deficit. He exhibits normal muscle tone. Coordination normal.  Skin: Skin is warm, dry and intact.  Psychiatric: He has a normal mood and affect. His behavior is normal. Judgment and thought content normal.  Nursing note and vitals reviewed.    ED Treatments / Results  Labs (all labs ordered are listed, but only abnormal results are displayed) Labs Reviewed - No data to display  EKG None  Radiology Dg Ribs Unilateral W/chest Right  Result Date: 08/17/2017 CLINICAL DATA:  Right-sided chest pain after tripping and falling. History of COPD and hypertension. EXAM: RIGHT RIBS AND CHEST - 3+ VIEW COMPARISON:  Radiographs 08/12/2017.  CT 07/20/2017. FINDINGS: The heart size and mediastinal contours are stable. There is a 2.4 cm nodular density projecting over the left heart border on the frontal examination, unchanged from recent chest radiographs and not clearly explained by CT done 4 weeks ago. The lungs are otherwise clear. There is no pleural effusion or pneumothorax. There are mildly displaced fractures of the right 7th and 8th ribs antral laterally, best seen on the oblique view. Left shoulder arthroplasty noted. IMPRESSION: 1. Acute mildly displaced fractures of the right 7th and 8th ribs. No pneumothorax or significant pleural effusion. 2. Nodular density projecting over the left heart border on the frontal examination may be related to recently demonstrated acute pulmonary embolism. There was no definite corresponding finding on the CT performed 07/20/2017. Attention on follow-up in 1-2 months recommended. Electronically Signed   By: 09/19/2017 M.D.   On:  08/17/2017 15:47   Ct Head Wo Contrast  Result Date: 08/17/2017 CLINICAL DATA:  Larey Seat outside striking curb, maxillofacial blunt trauma EXAM: CT HEAD WITHOUT CONTRAST CT MAXILLOFACIAL WITHOUT CONTRAST CT CERVICAL SPINE WITHOUT CONTRAST TECHNIQUE: Multidetector CT imaging of the head, cervical spine, and maxillofacial structures were performed using the standard protocol without intravenous contrast. Multiplanar CT image reconstructions of the cervical spine and maxillofacial structures were also generated. Right side of face marked with BB. COMPARISON:  CT head and cervical spine 08/12/2017 FINDINGS: CT HEAD FINDINGS Brain: Generalized atrophy. Normal ventricular morphology. No midline shift or mass effect. Small vessel chronic ischemic changes of deep cerebral white matter. No intracranial hemorrhage, mass lesion, evidence of acute infarction, or extra-axial fluid collection. Vascular: Minimal atherosclerotic calcification within internal carotid arteries at skull base. Skull: Bones demineralized but intact Other: N/A CT MAXILLOFACIAL FINDINGS Osseous: Osseous demineralization. TMJ alignment normal bilaterally. Few scattered beam hardening artifacts of dental origin. Minimal nasal septal deviation to the LEFT. Zygomas intact. Orbits and sinuses intact. No facial bone fractures identified. N/A Orbits: Intraorbital soft tissue planes clear. No orbital pneumatosis or fluid collection. Sinuses: Tiny mucosal retention cysts LEFT maxillary sinus. Paranasal sinuses, mastoid air cells and middle ear cavities otherwise clear. Soft tissues: Unremarkable CT CERVICAL SPINE FINDINGS Alignment: Unchanged. Mild anterolisthesis at C2-C3, 2.6 mm, unchanged. Remaining alignments normal. Skull base and vertebrae: Skull base intact. Bones demineralized. Degradation of image quality secondary to beam hardening from the shoulders and minimal motion. No gross evidence of fracture or subluxation identified. Mild disc space narrowing at  C5-C6 and C6-C7, less at remaining cervical discs. Uncovertebral spurs encroach upon the RIGHT C5-C6 neural foramen Soft tissues and spinal canal: Prevertebral soft tissues normal thickness. Atherosclerotic calcifications at the carotid bifurcations bilaterally. Disc levels:  No significant abnormalities Upper chest: Lung apices clear. Other: N/A IMPRESSION: Atrophy with small vessel chronic ischemic changes of deep cerebral white matter. No acute intracranial abnormalities. No acute facial bone abnormalities. Mild degenerative disc disease changes of the cervical spine without acute cervical spine abnormalities. Electronically Signed   By: Ulyses Southward M.D.   On: 08/17/2017 16:22   Ct Cervical Spine Wo Contrast  Result Date: 08/17/2017 CLINICAL DATA:  Larey Seat outside striking curb, maxillofacial blunt trauma EXAM: CT HEAD WITHOUT CONTRAST CT MAXILLOFACIAL WITHOUT CONTRAST CT CERVICAL SPINE WITHOUT CONTRAST TECHNIQUE: Multidetector CT imaging of the head, cervical spine, and maxillofacial structures were performed using the standard protocol without intravenous contrast. Multiplanar CT image reconstructions of the cervical spine and maxillofacial structures were also generated. Right side of face marked with BB. COMPARISON:  CT head and cervical spine 08/12/2017 FINDINGS: CT HEAD FINDINGS Brain: Generalized atrophy. Normal ventricular morphology. No midline shift or mass effect. Small vessel chronic ischemic changes of deep cerebral white matter. No intracranial hemorrhage, mass lesion, evidence of acute infarction, or extra-axial fluid collection. Vascular: Minimal atherosclerotic calcification within internal carotid arteries at skull base. Skull: Bones demineralized but intact Other: N/A CT MAXILLOFACIAL FINDINGS Osseous: Osseous demineralization. TMJ alignment normal bilaterally. Few scattered beam hardening artifacts of dental origin. Minimal nasal septal deviation to the LEFT. Zygomas intact. Orbits and sinuses  intact. No facial bone fractures identified. N/A Orbits: Intraorbital soft tissue planes clear. No orbital pneumatosis or fluid collection. Sinuses: Tiny mucosal retention cysts LEFT maxillary sinus. Paranasal sinuses, mastoid air cells and middle ear cavities otherwise clear. Soft tissues: Unremarkable CT CERVICAL SPINE FINDINGS Alignment: Unchanged. Mild anterolisthesis at C2-C3, 2.6 mm, unchanged. Remaining alignments normal. Skull base and vertebrae: Skull base intact. Bones demineralized. Degradation of image quality secondary to beam hardening from the shoulders and  minimal motion. No gross evidence of fracture or subluxation identified. Mild disc space narrowing at C5-C6 and C6-C7, less at remaining cervical discs. Uncovertebral spurs encroach upon the RIGHT C5-C6 neural foramen Soft tissues and spinal canal: Prevertebral soft tissues normal thickness. Atherosclerotic calcifications at the carotid bifurcations bilaterally. Disc levels:  No significant abnormalities Upper chest: Lung apices clear. Other: N/A IMPRESSION: Atrophy with small vessel chronic ischemic changes of deep cerebral white matter. No acute intracranial abnormalities. No acute facial bone abnormalities. Mild degenerative disc disease changes of the cervical spine without acute cervical spine abnormalities. Electronically Signed   By: Ulyses Southward M.D.   On: 08/17/2017 16:22   Ct Maxillofacial Wo Cm  Result Date: 08/17/2017 CLINICAL DATA:  Larey Seat outside striking curb, maxillofacial blunt trauma EXAM: CT HEAD WITHOUT CONTRAST CT MAXILLOFACIAL WITHOUT CONTRAST CT CERVICAL SPINE WITHOUT CONTRAST TECHNIQUE: Multidetector CT imaging of the head, cervical spine, and maxillofacial structures were performed using the standard protocol without intravenous contrast. Multiplanar CT image reconstructions of the cervical spine and maxillofacial structures were also generated. Right side of face marked with BB. COMPARISON:  CT head and cervical spine  08/12/2017 FINDINGS: CT HEAD FINDINGS Brain: Generalized atrophy. Normal ventricular morphology. No midline shift or mass effect. Small vessel chronic ischemic changes of deep cerebral white matter. No intracranial hemorrhage, mass lesion, evidence of acute infarction, or extra-axial fluid collection. Vascular: Minimal atherosclerotic calcification within internal carotid arteries at skull base. Skull: Bones demineralized but intact Other: N/A CT MAXILLOFACIAL FINDINGS Osseous: Osseous demineralization. TMJ alignment normal bilaterally. Few scattered beam hardening artifacts of dental origin. Minimal nasal septal deviation to the LEFT. Zygomas intact. Orbits and sinuses intact. No facial bone fractures identified. N/A Orbits: Intraorbital soft tissue planes clear. No orbital pneumatosis or fluid collection. Sinuses: Tiny mucosal retention cysts LEFT maxillary sinus. Paranasal sinuses, mastoid air cells and middle ear cavities otherwise clear. Soft tissues: Unremarkable CT CERVICAL SPINE FINDINGS Alignment: Unchanged. Mild anterolisthesis at C2-C3, 2.6 mm, unchanged. Remaining alignments normal. Skull base and vertebrae: Skull base intact. Bones demineralized. Degradation of image quality secondary to beam hardening from the shoulders and minimal motion. No gross evidence of fracture or subluxation identified. Mild disc space narrowing at C5-C6 and C6-C7, less at remaining cervical discs. Uncovertebral spurs encroach upon the RIGHT C5-C6 neural foramen Soft tissues and spinal canal: Prevertebral soft tissues normal thickness. Atherosclerotic calcifications at the carotid bifurcations bilaterally. Disc levels:  No significant abnormalities Upper chest: Lung apices clear. Other: N/A IMPRESSION: Atrophy with small vessel chronic ischemic changes of deep cerebral white matter. No acute intracranial abnormalities. No acute facial bone abnormalities. Mild degenerative disc disease changes of the cervical spine without  acute cervical spine abnormalities. Electronically Signed   By: Ulyses Southward M.D.   On: 08/17/2017 16:22    Procedures Procedures (including critical care time)  Medications Ordered in ED Medications  Tdap (BOOSTRIX) injection 0.5 mL (0.5 mLs Intramuscular Given 08/17/17 1608)     Initial Impression / Assessment and Plan / ED Course  I have reviewed the triage vital signs and the nursing notes.  Pertinent labs & imaging results that were available during my care of the patient were reviewed by me and considered in my medical decision making (see chart for details).  Clinical Course as of Aug 17 1644  Tue Aug 17, 2017  1636 CT Head Wo Contrast [EW]  1636 CT head, cervical spine and face without fracture, images reviewed by me  CT Head Wo Contrast [EW]  1638 Image  chest and ribs, negative for fracture, images reviewed by me  DG Ribs Unilateral W/Chest Right [EW]    Clinical Course User Index [EW] Mancel Bale, MD     Patient Vitals for the past 24 hrs:  BP Temp Temp src Pulse Resp SpO2 Height Weight  08/17/17 1451 114/72 97.6 F (36.4 C) Oral (!) 107 16 96 % - -  08/17/17 1433 - - - - - - 5\' 7"  (1.702 m) 125.2 kg (276 lb)  08/17/17 1428 - - - - - 100 % - -    4:40 PM Reevaluation with update and discussion. After initial assessment and treatment, an updated evaluation reveals no change in clinical status.  Findings discussed with patient and all questions answered. Mancel Bale   Medical Decision Making: Fall likely mechanical without serious injury.  Her abrasions were treated symptomatically and he was treated with a tetanus booster.  Doubt fracture, metabolic instability or impending vascular collapse.  CRITICAL CARE-no Performed by: Mancel Bale   Nursing Notes Reviewed/ Care Coordinated Applicable Imaging Reviewed Interpretation of Laboratory Data incorporated into ED treatment  The patient appears reasonably screened and/or stabilized for discharge and I doubt  any other medical condition or other [facility] requiring further screening, evaluation, or treatment in the ED at this time prior to discharge.  Plan: Home Medications-continue usual medications, use OTC medications for analgesia if needed; Home Treatments-gradually increase activity; return here if the recommended treatment, does not improve the symptoms; Recommended follow up-PCP, as needed     Final Clinical Impressions(s) / ED Diagnoses   Final diagnoses:  Fall, initial encounter  Injury of head, initial encounter  Contusion, multiple sites  Abrasion, multiple sites    ED Discharge Orders    None       Mancel Bale, MD 08/17/17 [phone]

## 2017-08-17 NOTE — ED Notes (Signed)
Pt informed by [facility] that bc he is a/o x4 and ambulatory, despite his need for oxygen, his insurance will not pay and he will be responsible for his transportation bill. Pt now refuses to go via [facility]. Social work contacted for [employer], EDP made aware.

## 2017-08-17 NOTE — ED Notes (Signed)
Bed: OZ30 Expected date:  Expected time:  Means of arrival:  Comments: For Hallway C

## 2017-08-17 NOTE — ED Notes (Signed)
Bed: [facility] Expected date: 08/17/17 Expected time: 2:18 PM Means of arrival: Ambulance Comments: Fall from Nsg home, Rib pain.

## 2017-08-17 NOTE — Progress Notes (Signed)
CSW received a call from pt's RN stating that while pt is able to ambulate, pt is not able to ambulate well and is on oxygen.  RN attempted [facility] but [facility] arrived and stated pt is not qualified for [facility] transport despite impossibility of using a bus and difficulty with oxygen making other forms of transport difficult.  RN states pt would be agreeable to a taxi, CSW providing and has called [employer].  CSW will continue to follow for D/C needs.  Dorothe Pea. Warner Laduca, LCSW, LCAS, CSI Clinical Social Worker Ph: [phone]      '

## 2017-08-17 NOTE — Discharge Instructions (Addendum)
Keep the wounds clean with soap and water daily and apply a topical antibiotic if needed to improve healing.  Use Tylenol if needed for pain.  Gradually increase your activities.  Continue your usual medical treatments, with your medical care providers.

## 2017-08-17 NOTE — ED Notes (Signed)
Called [facility] for patient to return back to his resident with oxygen.

## 2017-08-17 NOTE — ED Triage Notes (Signed)
Patient is from home and transported via [facility]. Patient fell outside, hitting the curb. EMS reports he fell on his right side, complaining of right rib pain. Has an abrasion to his right knee and nose. Denies hitting head. While enroute, patient started complaining of shortness of breath. EMS notes he has wheezing to the left upper and lower lobes. Patient was given an Albuterol nebulizer enroute. Has an history of COPD and asthma.

## 2017-08-17 NOTE — ED Notes (Signed)
Wounds cleaned and covered with Telfa and gauze.

## 2017-08-18 DIAGNOSIS — S81812D Laceration without foreign body, left lower leg, subsequent encounter: Secondary | ICD-10-CM | POA: Diagnosis not present

## 2017-08-18 DIAGNOSIS — S81811D Laceration without foreign body, right lower leg, subsequent encounter: Secondary | ICD-10-CM | POA: Diagnosis not present

## 2017-08-18 DIAGNOSIS — I130 Hypertensive heart and chronic kidney disease with heart failure and stage 1 through stage 4 chronic kidney disease, or unspecified chronic kidney disease: Secondary | ICD-10-CM | POA: Diagnosis not present

## 2017-08-18 DIAGNOSIS — I5031 Acute diastolic (congestive) heart failure: Secondary | ICD-10-CM | POA: Diagnosis not present

## 2017-08-18 DIAGNOSIS — E1122 Type 2 diabetes mellitus with diabetic chronic kidney disease: Secondary | ICD-10-CM | POA: Diagnosis not present

## 2017-08-18 DIAGNOSIS — J439 Emphysema, unspecified: Secondary | ICD-10-CM | POA: Diagnosis not present

## 2017-08-19 DIAGNOSIS — I5031 Acute diastolic (congestive) heart failure: Secondary | ICD-10-CM | POA: Diagnosis not present

## 2017-08-19 DIAGNOSIS — S81811D Laceration without foreign body, right lower leg, subsequent encounter: Secondary | ICD-10-CM | POA: Diagnosis not present

## 2017-08-19 DIAGNOSIS — J439 Emphysema, unspecified: Secondary | ICD-10-CM | POA: Diagnosis not present

## 2017-08-19 DIAGNOSIS — I130 Hypertensive heart and chronic kidney disease with heart failure and stage 1 through stage 4 chronic kidney disease, or unspecified chronic kidney disease: Secondary | ICD-10-CM | POA: Diagnosis not present

## 2017-08-19 DIAGNOSIS — E1122 Type 2 diabetes mellitus with diabetic chronic kidney disease: Secondary | ICD-10-CM | POA: Diagnosis not present

## 2017-08-19 DIAGNOSIS — S81812D Laceration without foreign body, left lower leg, subsequent encounter: Secondary | ICD-10-CM | POA: Diagnosis not present

## 2017-08-20 DIAGNOSIS — E1122 Type 2 diabetes mellitus with diabetic chronic kidney disease: Secondary | ICD-10-CM | POA: Diagnosis not present

## 2017-08-20 DIAGNOSIS — I130 Hypertensive heart and chronic kidney disease with heart failure and stage 1 through stage 4 chronic kidney disease, or unspecified chronic kidney disease: Secondary | ICD-10-CM | POA: Diagnosis not present

## 2017-08-20 DIAGNOSIS — S81811D Laceration without foreign body, right lower leg, subsequent encounter: Secondary | ICD-10-CM | POA: Diagnosis not present

## 2017-08-20 DIAGNOSIS — I5031 Acute diastolic (congestive) heart failure: Secondary | ICD-10-CM | POA: Diagnosis not present

## 2017-08-20 DIAGNOSIS — S81812D Laceration without foreign body, left lower leg, subsequent encounter: Secondary | ICD-10-CM | POA: Diagnosis not present

## 2017-08-20 DIAGNOSIS — J439 Emphysema, unspecified: Secondary | ICD-10-CM | POA: Diagnosis not present

## 2017-08-23 DIAGNOSIS — E1122 Type 2 diabetes mellitus with diabetic chronic kidney disease: Secondary | ICD-10-CM | POA: Diagnosis not present

## 2017-08-23 DIAGNOSIS — J439 Emphysema, unspecified: Secondary | ICD-10-CM | POA: Diagnosis not present

## 2017-08-23 DIAGNOSIS — I5031 Acute diastolic (congestive) heart failure: Secondary | ICD-10-CM | POA: Diagnosis not present

## 2017-08-23 DIAGNOSIS — S81811D Laceration without foreign body, right lower leg, subsequent encounter: Secondary | ICD-10-CM | POA: Diagnosis not present

## 2017-08-23 DIAGNOSIS — I130 Hypertensive heart and chronic kidney disease with heart failure and stage 1 through stage 4 chronic kidney disease, or unspecified chronic kidney disease: Secondary | ICD-10-CM | POA: Diagnosis not present

## 2017-08-23 DIAGNOSIS — S81812D Laceration without foreign body, left lower leg, subsequent encounter: Secondary | ICD-10-CM | POA: Diagnosis not present

## 2017-08-24 DIAGNOSIS — K219 Gastro-esophageal reflux disease without esophagitis: Secondary | ICD-10-CM | POA: Diagnosis not present

## 2017-08-24 DIAGNOSIS — R600 Localized edema: Secondary | ICD-10-CM | POA: Diagnosis not present

## 2017-08-25 DIAGNOSIS — S81812D Laceration without foreign body, left lower leg, subsequent encounter: Secondary | ICD-10-CM | POA: Diagnosis not present

## 2017-08-25 DIAGNOSIS — J439 Emphysema, unspecified: Secondary | ICD-10-CM | POA: Diagnosis not present

## 2017-08-25 DIAGNOSIS — S81811D Laceration without foreign body, right lower leg, subsequent encounter: Secondary | ICD-10-CM | POA: Diagnosis not present

## 2017-08-25 DIAGNOSIS — E1122 Type 2 diabetes mellitus with diabetic chronic kidney disease: Secondary | ICD-10-CM | POA: Diagnosis not present

## 2017-08-25 DIAGNOSIS — I130 Hypertensive heart and chronic kidney disease with heart failure and stage 1 through stage 4 chronic kidney disease, or unspecified chronic kidney disease: Secondary | ICD-10-CM | POA: Diagnosis not present

## 2017-08-25 DIAGNOSIS — I5031 Acute diastolic (congestive) heart failure: Secondary | ICD-10-CM | POA: Diagnosis not present

## 2017-08-26 DIAGNOSIS — I130 Hypertensive heart and chronic kidney disease with heart failure and stage 1 through stage 4 chronic kidney disease, or unspecified chronic kidney disease: Secondary | ICD-10-CM | POA: Diagnosis not present

## 2017-08-26 DIAGNOSIS — S81811D Laceration without foreign body, right lower leg, subsequent encounter: Secondary | ICD-10-CM | POA: Diagnosis not present

## 2017-08-26 DIAGNOSIS — J439 Emphysema, unspecified: Secondary | ICD-10-CM | POA: Diagnosis not present

## 2017-08-26 DIAGNOSIS — E1122 Type 2 diabetes mellitus with diabetic chronic kidney disease: Secondary | ICD-10-CM | POA: Diagnosis not present

## 2017-08-26 DIAGNOSIS — I5031 Acute diastolic (congestive) heart failure: Secondary | ICD-10-CM | POA: Diagnosis not present

## 2017-08-26 DIAGNOSIS — S81812D Laceration without foreign body, left lower leg, subsequent encounter: Secondary | ICD-10-CM | POA: Diagnosis not present

## 2017-08-27 DIAGNOSIS — E1122 Type 2 diabetes mellitus with diabetic chronic kidney disease: Secondary | ICD-10-CM | POA: Diagnosis not present

## 2017-08-27 DIAGNOSIS — J439 Emphysema, unspecified: Secondary | ICD-10-CM | POA: Diagnosis not present

## 2017-08-27 DIAGNOSIS — I5031 Acute diastolic (congestive) heart failure: Secondary | ICD-10-CM | POA: Diagnosis not present

## 2017-08-27 DIAGNOSIS — S81812D Laceration without foreign body, left lower leg, subsequent encounter: Secondary | ICD-10-CM | POA: Diagnosis not present

## 2017-08-27 DIAGNOSIS — I130 Hypertensive heart and chronic kidney disease with heart failure and stage 1 through stage 4 chronic kidney disease, or unspecified chronic kidney disease: Secondary | ICD-10-CM | POA: Diagnosis not present

## 2017-08-27 DIAGNOSIS — S81811D Laceration without foreign body, right lower leg, subsequent encounter: Secondary | ICD-10-CM | POA: Diagnosis not present

## 2017-08-30 DIAGNOSIS — S81811D Laceration without foreign body, right lower leg, subsequent encounter: Secondary | ICD-10-CM | POA: Diagnosis not present

## 2017-08-30 DIAGNOSIS — E1122 Type 2 diabetes mellitus with diabetic chronic kidney disease: Secondary | ICD-10-CM | POA: Diagnosis not present

## 2017-08-30 DIAGNOSIS — J439 Emphysema, unspecified: Secondary | ICD-10-CM | POA: Diagnosis not present

## 2017-08-30 DIAGNOSIS — S81812D Laceration without foreign body, left lower leg, subsequent encounter: Secondary | ICD-10-CM | POA: Diagnosis not present

## 2017-08-30 DIAGNOSIS — I130 Hypertensive heart and chronic kidney disease with heart failure and stage 1 through stage 4 chronic kidney disease, or unspecified chronic kidney disease: Secondary | ICD-10-CM | POA: Diagnosis not present

## 2017-08-30 DIAGNOSIS — I5031 Acute diastolic (congestive) heart failure: Secondary | ICD-10-CM | POA: Diagnosis not present

## 2017-08-31 DIAGNOSIS — J439 Emphysema, unspecified: Secondary | ICD-10-CM | POA: Diagnosis not present

## 2017-08-31 DIAGNOSIS — S81811D Laceration without foreign body, right lower leg, subsequent encounter: Secondary | ICD-10-CM | POA: Diagnosis not present

## 2017-08-31 DIAGNOSIS — I130 Hypertensive heart and chronic kidney disease with heart failure and stage 1 through stage 4 chronic kidney disease, or unspecified chronic kidney disease: Secondary | ICD-10-CM | POA: Diagnosis not present

## 2017-08-31 DIAGNOSIS — E1122 Type 2 diabetes mellitus with diabetic chronic kidney disease: Secondary | ICD-10-CM | POA: Diagnosis not present

## 2017-08-31 DIAGNOSIS — S81812D Laceration without foreign body, left lower leg, subsequent encounter: Secondary | ICD-10-CM | POA: Diagnosis not present

## 2017-08-31 DIAGNOSIS — I5031 Acute diastolic (congestive) heart failure: Secondary | ICD-10-CM | POA: Diagnosis not present

## 2017-09-02 ENCOUNTER — Other Ambulatory Visit: Payer: Self-pay

## 2017-09-02 ENCOUNTER — Encounter (HOSPITAL_COMMUNITY): Payer: Self-pay | Admitting: Emergency Medicine

## 2017-09-02 ENCOUNTER — Emergency Department (HOSPITAL_COMMUNITY): Payer: Medicare Other

## 2017-09-02 ENCOUNTER — Observation Stay (HOSPITAL_COMMUNITY)
Admission: EM | Admit: 2017-09-02 | Discharge: 2017-09-04 | Disposition: A | Discharge status: Home Health | Payer: Medicare Other | Attending: Internal Medicine | Admitting: Internal Medicine

## 2017-09-02 DIAGNOSIS — I951 Orthostatic hypotension: Secondary | ICD-10-CM | POA: Diagnosis present

## 2017-09-02 DIAGNOSIS — I872 Venous insufficiency (chronic) (peripheral): Secondary | ICD-10-CM | POA: Insufficient documentation

## 2017-09-02 DIAGNOSIS — Z9981 Dependence on supplemental oxygen: Secondary | ICD-10-CM | POA: Diagnosis not present

## 2017-09-02 DIAGNOSIS — I714 Abdominal aortic aneurysm, without rupture: Secondary | ICD-10-CM | POA: Insufficient documentation

## 2017-09-02 DIAGNOSIS — E871 Hypo-osmolality and hyponatremia: Secondary | ICD-10-CM | POA: Diagnosis not present

## 2017-09-02 DIAGNOSIS — R7989 Other specified abnormal findings of blood chemistry: Secondary | ICD-10-CM | POA: Diagnosis not present

## 2017-09-02 DIAGNOSIS — E1122 Type 2 diabetes mellitus with diabetic chronic kidney disease: Principal | ICD-10-CM | POA: Insufficient documentation

## 2017-09-02 DIAGNOSIS — K760 Fatty (change of) liver, not elsewhere classified: Secondary | ICD-10-CM | POA: Insufficient documentation

## 2017-09-02 DIAGNOSIS — M069 Rheumatoid arthritis, unspecified: Secondary | ICD-10-CM | POA: Diagnosis present

## 2017-09-02 DIAGNOSIS — I130 Hypertensive heart and chronic kidney disease with heart failure and stage 1 through stage 4 chronic kidney disease, or unspecified chronic kidney disease: Secondary | ICD-10-CM | POA: Diagnosis not present

## 2017-09-02 DIAGNOSIS — R32 Unspecified urinary incontinence: Secondary | ICD-10-CM | POA: Diagnosis present

## 2017-09-02 DIAGNOSIS — F17210 Nicotine dependence, cigarettes, uncomplicated: Secondary | ICD-10-CM | POA: Diagnosis not present

## 2017-09-02 DIAGNOSIS — G4733 Obstructive sleep apnea (adult) (pediatric): Secondary | ICD-10-CM | POA: Diagnosis not present

## 2017-09-02 DIAGNOSIS — R069 Unspecified abnormalities of breathing: Secondary | ICD-10-CM | POA: Diagnosis not present

## 2017-09-02 DIAGNOSIS — I5033 Acute on chronic diastolic (congestive) heart failure: Secondary | ICD-10-CM | POA: Diagnosis present

## 2017-09-02 DIAGNOSIS — E876 Hypokalemia: Secondary | ICD-10-CM | POA: Diagnosis not present

## 2017-09-02 DIAGNOSIS — Z86711 Personal history of pulmonary embolism: Secondary | ICD-10-CM | POA: Insufficient documentation

## 2017-09-02 DIAGNOSIS — N179 Acute kidney failure, unspecified: Secondary | ICD-10-CM | POA: Insufficient documentation

## 2017-09-02 DIAGNOSIS — M109 Gout, unspecified: Secondary | ICD-10-CM | POA: Insufficient documentation

## 2017-09-02 DIAGNOSIS — Z7189 Other specified counseling: Secondary | ICD-10-CM

## 2017-09-02 DIAGNOSIS — J449 Chronic obstructive pulmonary disease, unspecified: Secondary | ICD-10-CM | POA: Diagnosis present

## 2017-09-02 DIAGNOSIS — R945 Abnormal results of liver function studies: Secondary | ICD-10-CM

## 2017-09-02 DIAGNOSIS — I2510 Atherosclerotic heart disease of native coronary artery without angina pectoris: Secondary | ICD-10-CM | POA: Insufficient documentation

## 2017-09-02 DIAGNOSIS — Z9181 History of falling: Secondary | ICD-10-CM | POA: Diagnosis not present

## 2017-09-02 DIAGNOSIS — E872 Acidosis: Secondary | ICD-10-CM | POA: Insufficient documentation

## 2017-09-02 DIAGNOSIS — Z888 Allergy status to other drugs, medicaments and biological substances status: Secondary | ICD-10-CM | POA: Insufficient documentation

## 2017-09-02 DIAGNOSIS — R2689 Other abnormalities of gait and mobility: Secondary | ICD-10-CM | POA: Insufficient documentation

## 2017-09-02 DIAGNOSIS — Z96642 Presence of left artificial hip joint: Secondary | ICD-10-CM | POA: Insufficient documentation

## 2017-09-02 DIAGNOSIS — R509 Fever, unspecified: Secondary | ICD-10-CM | POA: Diagnosis not present

## 2017-09-02 DIAGNOSIS — R600 Localized edema: Secondary | ICD-10-CM | POA: Diagnosis not present

## 2017-09-02 DIAGNOSIS — D539 Nutritional anemia, unspecified: Secondary | ICD-10-CM | POA: Diagnosis not present

## 2017-09-02 DIAGNOSIS — Z515 Encounter for palliative care: Secondary | ICD-10-CM

## 2017-09-02 DIAGNOSIS — E1165 Type 2 diabetes mellitus with hyperglycemia: Secondary | ICD-10-CM | POA: Diagnosis not present

## 2017-09-02 DIAGNOSIS — Z96612 Presence of left artificial shoulder joint: Secondary | ICD-10-CM | POA: Insufficient documentation

## 2017-09-02 DIAGNOSIS — I4510 Unspecified right bundle-branch block: Secondary | ICD-10-CM | POA: Insufficient documentation

## 2017-09-02 DIAGNOSIS — Z86718 Personal history of other venous thrombosis and embolism: Secondary | ICD-10-CM | POA: Insufficient documentation

## 2017-09-02 DIAGNOSIS — I5032 Chronic diastolic (congestive) heart failure: Secondary | ICD-10-CM | POA: Insufficient documentation

## 2017-09-02 DIAGNOSIS — N183 Chronic kidney disease, stage 3 unspecified: Secondary | ICD-10-CM | POA: Diagnosis present

## 2017-09-02 DIAGNOSIS — Z7901 Long term (current) use of anticoagulants: Secondary | ICD-10-CM | POA: Diagnosis not present

## 2017-09-02 DIAGNOSIS — Z79899 Other long term (current) drug therapy: Secondary | ICD-10-CM | POA: Insufficient documentation

## 2017-09-02 DIAGNOSIS — Z7984 Long term (current) use of oral hypoglycemic drugs: Secondary | ICD-10-CM | POA: Insufficient documentation

## 2017-09-02 DIAGNOSIS — I110 Hypertensive heart disease with heart failure: Secondary | ICD-10-CM | POA: Diagnosis not present

## 2017-09-02 DIAGNOSIS — R0602 Shortness of breath: Secondary | ICD-10-CM | POA: Diagnosis not present

## 2017-09-02 DIAGNOSIS — Z7982 Long term (current) use of aspirin: Secondary | ICD-10-CM | POA: Insufficient documentation

## 2017-09-02 DIAGNOSIS — E785 Hyperlipidemia, unspecified: Secondary | ICD-10-CM | POA: Insufficient documentation

## 2017-09-02 DIAGNOSIS — R0902 Hypoxemia: Secondary | ICD-10-CM | POA: Diagnosis not present

## 2017-09-02 DIAGNOSIS — Z66 Do not resuscitate: Secondary | ICD-10-CM | POA: Insufficient documentation

## 2017-09-02 DIAGNOSIS — Z7952 Long term (current) use of systemic steroids: Secondary | ICD-10-CM | POA: Insufficient documentation

## 2017-09-02 DIAGNOSIS — Z6834 Body mass index (BMI) 34.0-34.9, adult: Secondary | ICD-10-CM | POA: Insufficient documentation

## 2017-09-02 DIAGNOSIS — N189 Chronic kidney disease, unspecified: Secondary | ICD-10-CM

## 2017-09-02 DIAGNOSIS — G4730 Sleep apnea, unspecified: Secondary | ICD-10-CM | POA: Diagnosis present

## 2017-09-02 DIAGNOSIS — K219 Gastro-esophageal reflux disease without esophagitis: Secondary | ICD-10-CM | POA: Insufficient documentation

## 2017-09-02 LAB — URINALYSIS, ROUTINE W REFLEX MICROSCOPIC
Bilirubin Urine: NEGATIVE
Glucose, UA: NEGATIVE mg/dL
Ketones, ur: NEGATIVE mg/dL
Leukocytes, UA: NEGATIVE
Nitrite: NEGATIVE
Protein, ur: NEGATIVE mg/dL
Specific Gravity, Urine: <1.005 — ABNORMAL LOW (ref 1.005–1.030)
pH: 6 (ref 5.0–8.0)

## 2017-09-02 LAB — CBC WITH DIFFERENTIAL/PLATELET
Abs Immature Granulocytes: 0.2 10*3/uL — ABNORMAL HIGH (ref 0.0–0.1)
Basophils Absolute: 0 10*3/uL (ref 0.0–0.1)
Basophils Relative: 0 %
Eosinophils Absolute: 0 10*3/uL (ref 0.0–0.7)
Eosinophils Relative: 0 %
HCT: 35.1 % — ABNORMAL LOW (ref 39.0–52.0)
Hemoglobin: 11.9 g/dL — ABNORMAL LOW (ref 13.0–17.0)
Immature Granulocytes: 3 %
Lymphocytes Relative: 14 %
Lymphs Abs: 1.1 10*3/uL (ref 0.7–4.0)
MCH: 34.7 pg — ABNORMAL HIGH (ref 26.0–34.0)
MCHC: 33.9 g/dL (ref 30.0–36.0)
MCV: 102.3 fL — ABNORMAL HIGH (ref 78.0–100.0)
Monocytes Absolute: 0.3 10*3/uL (ref 0.1–1.0)
Monocytes Relative: 4 %
Neutro Abs: 6 10*3/uL (ref 1.7–7.7)
Neutrophils Relative %: 79 %
Platelets: 203 10*3/uL (ref 150–400)
RBC: 3.43 MIL/uL — ABNORMAL LOW (ref 4.22–5.81)
RDW: 17.4 % — ABNORMAL HIGH (ref 11.5–15.5)
WBC: 7.6 10*3/uL (ref 4.0–10.5)

## 2017-09-02 LAB — COMPREHENSIVE METABOLIC PANEL
ALT: 121 U/L — ABNORMAL HIGH (ref 0–44)
AST: 77 U/L — ABNORMAL HIGH (ref 15–41)
Albumin: 3.1 g/dL — ABNORMAL LOW (ref 3.5–5.0)
Alkaline Phosphatase: 71 U/L (ref 38–126)
Anion gap: 20 — ABNORMAL HIGH (ref 5–15)
BUN: 69 mg/dL — ABNORMAL HIGH (ref 8–23)
CO2: 32 mmol/L (ref 22–32)
Calcium: 8.7 mg/dL — ABNORMAL LOW (ref 8.9–10.3)
Chloride: 83 mmol/L — ABNORMAL LOW (ref 98–111)
Creatinine, Ser: 2.91 mg/dL — ABNORMAL HIGH (ref 0.61–1.24)
GFR calc Af Amer: 23 mL/min — ABNORMAL LOW (ref >60)
GFR calc non Af Amer: 20 mL/min — ABNORMAL LOW (ref >60)
Glucose, Bld: 178 mg/dL — ABNORMAL HIGH (ref 70–99)
Potassium: 2.7 mmol/L — CL (ref 3.5–5.1)
Sodium: 135 mmol/L (ref 135–145)
Total Bilirubin: 1.2 mg/dL (ref 0.3–1.2)
Total Protein: 6.4 g/dL — ABNORMAL LOW (ref 6.5–8.1)

## 2017-09-02 LAB — I-STAT CG4 LACTIC ACID, ED: Lactic Acid, Venous: 2.84 mmol/L (ref 0.5–1.9)

## 2017-09-02 LAB — URINALYSIS, MICROSCOPIC (REFLEX): Squamous Epithelial / LPF: NONE SEEN (ref 0–5)

## 2017-09-02 LAB — MAGNESIUM: Magnesium: 1.2 mg/dL — ABNORMAL LOW (ref 1.7–2.4)

## 2017-09-02 LAB — HEMOGLOBIN A1C
Hgb A1c MFr Bld: 8 % — ABNORMAL HIGH (ref 4.8–5.6)
Mean Plasma Glucose: 182.9 mg/dL

## 2017-09-02 LAB — BRAIN NATRIURETIC PEPTIDE: B Natriuretic Peptide: 60.2 pg/mL (ref 0.0–100.0)

## 2017-09-02 LAB — TROPONIN I: Troponin I: 0.06 ng/mL (ref <0.03)

## 2017-09-02 LAB — LACTIC ACID, PLASMA: Lactic Acid, Venous: 3.4 mmol/L (ref 0.5–1.9)

## 2017-09-02 LAB — GLUCOSE, CAPILLARY
Glucose-Capillary: 208 mg/dL — ABNORMAL HIGH (ref 70–99)
Glucose-Capillary: 215 mg/dL — ABNORMAL HIGH (ref 70–99)

## 2017-09-02 MED ORDER — FEBUXOSTAT 40 MG PO TABS
40.0000 mg | ORAL_TABLET | Freq: Every day | ORAL | Status: DC
  Administered 2017-09-02 – 2017-09-04 (×3): 40 mg via ORAL
  Filled 2017-09-02 (×3): qty 1

## 2017-09-02 MED ORDER — PANTOPRAZOLE SODIUM 40 MG PO TBEC
40.0000 mg | DELAYED_RELEASE_TABLET | Freq: Two times a day (BID) | ORAL | Status: DC
  Administered 2017-09-02 – 2017-09-04 (×5): 40 mg via ORAL
  Filled 2017-09-02 (×5): qty 1

## 2017-09-02 MED ORDER — INSULIN ASPART 100 UNIT/ML ~~LOC~~ SOLN
0.0000 [IU] | Freq: Three times a day (TID) | SUBCUTANEOUS | Status: DC
  Administered 2017-09-02 – 2017-09-03 (×2): 7 [IU] via SUBCUTANEOUS
  Administered 2017-09-03 (×2): 4 [IU] via SUBCUTANEOUS
  Administered 2017-09-04 (×2): 7 [IU] via SUBCUTANEOUS

## 2017-09-02 MED ORDER — TIOTROPIUM BROMIDE MONOHYDRATE 2.5 MCG/ACT IN AERS: 2.0000 | INHALATION_SPRAY | Freq: Every day | RESPIRATORY_TRACT | Status: DC

## 2017-09-02 MED ORDER — ONDANSETRON HCL 4 MG/2ML IJ SOLN: 4.0000 mg | Freq: Four times a day (QID) | INTRAMUSCULAR | Status: DC | PRN

## 2017-09-02 MED ORDER — ONE-DAILY MULTI VITAMINS PO TABS: 1.0000 | ORAL_TABLET | Freq: Every day | ORAL | Status: DC

## 2017-09-02 MED ORDER — HYDROCORTISONE NA SUCCINATE PF 100 MG IJ SOLR
100.0000 mg | Freq: Once | INTRAMUSCULAR | Status: AC
  Administered 2017-09-02: 100 mg via INTRAVENOUS
  Filled 2017-09-02: qty 2

## 2017-09-02 MED ORDER — ASPIRIN EC 81 MG PO TBEC
81.0000 mg | DELAYED_RELEASE_TABLET | Freq: Every day | ORAL | Status: DC
  Administered 2017-09-02 – 2017-09-04 (×3): 81 mg via ORAL
  Filled 2017-09-02 (×3): qty 1

## 2017-09-02 MED ORDER — IPRATROPIUM-ALBUTEROL 0.5-2.5 (3) MG/3ML IN SOLN
3.0000 mL | Freq: Once | RESPIRATORY_TRACT | Status: AC
  Administered 2017-09-02: 3 mL via RESPIRATORY_TRACT
  Filled 2017-09-02: qty 3

## 2017-09-02 MED ORDER — CALCIUM CARBONATE-VITAMIN D 500-200 MG-UNIT PO TABS
1.0000 | ORAL_TABLET | Freq: Every day | ORAL | Status: DC
  Administered 2017-09-03 – 2017-09-04 (×2): 1 via ORAL
  Filled 2017-09-02 (×2): qty 1

## 2017-09-02 MED ORDER — IPRATROPIUM-ALBUTEROL 0.5-2.5 (3) MG/3ML IN SOLN
RESPIRATORY_TRACT | Status: AC
  Filled 2017-09-02: qty 3

## 2017-09-02 MED ORDER — SODIUM CHLORIDE 0.9% FLUSH
3.0000 mL | Freq: Two times a day (BID) | INTRAVENOUS | Status: DC
  Administered 2017-09-02 – 2017-09-04 (×5): 3 mL via INTRAVENOUS

## 2017-09-02 MED ORDER — METHOTREXATE 2.5 MG PO TABS: 10.0000 mg | ORAL_TABLET | ORAL | Status: DC

## 2017-09-02 MED ORDER — TRAMADOL HCL 50 MG PO TABS: 50.0000 mg | ORAL_TABLET | Freq: Four times a day (QID) | ORAL | Status: DC | PRN

## 2017-09-02 MED ORDER — INSULIN ASPART 100 UNIT/ML ~~LOC~~ SOLN
0.0000 [IU] | Freq: Every day | SUBCUTANEOUS | Status: DC
  Administered 2017-09-02: 2 [IU] via SUBCUTANEOUS
  Administered 2017-09-03: 3 [IU] via SUBCUTANEOUS

## 2017-09-02 MED ORDER — APIXABAN 5 MG PO TABS
5.0000 mg | ORAL_TABLET | Freq: Two times a day (BID) | ORAL | Status: DC
  Administered 2017-09-02 – 2017-09-04 (×5): 5 mg via ORAL
  Filled 2017-09-02 (×5): qty 1

## 2017-09-02 MED ORDER — ADULT MULTIVITAMIN W/MINERALS CH
1.0000 | ORAL_TABLET | Freq: Every day | ORAL | Status: DC
  Administered 2017-09-02 – 2017-09-04 (×3): 1 via ORAL
  Filled 2017-09-02 (×3): qty 1

## 2017-09-02 MED ORDER — ATORVASTATIN CALCIUM 40 MG PO TABS
40.0000 mg | ORAL_TABLET | Freq: Every day | ORAL | Status: DC
  Administered 2017-09-02 – 2017-09-04 (×3): 40 mg via ORAL
  Filled 2017-09-02 (×3): qty 1

## 2017-09-02 MED ORDER — CYANOCOBALAMIN 500 MCG PO TABS
2500.0000 ug | ORAL_TABLET | Freq: Every day | ORAL | Status: DC
  Administered 2017-09-03 – 2017-09-04 (×2): 2500 ug via ORAL
  Filled 2017-09-02 (×3): qty 1

## 2017-09-02 MED ORDER — TIOTROPIUM BROMIDE MONOHYDRATE 18 MCG IN CAPS
18.0000 ug | ORAL_CAPSULE | Freq: Every day | RESPIRATORY_TRACT | Status: DC
  Administered 2017-09-03 – 2017-09-04 (×2): 18 ug via RESPIRATORY_TRACT
  Filled 2017-09-02: qty 5

## 2017-09-02 MED ORDER — POTASSIUM CHLORIDE CRYS ER 20 MEQ PO TBCR
20.0000 meq | EXTENDED_RELEASE_TABLET | Freq: Three times a day (TID) | ORAL | Status: DC
  Administered 2017-09-02 (×2): 20 meq via ORAL
  Filled 2017-09-02 (×2): qty 1

## 2017-09-02 MED ORDER — MOMETASONE FURO-FORMOTEROL FUM 200-5 MCG/ACT IN AERO
2.0000 | INHALATION_SPRAY | Freq: Two times a day (BID) | RESPIRATORY_TRACT | Status: DC
  Administered 2017-09-02 – 2017-09-04 (×4): 2 via RESPIRATORY_TRACT
  Filled 2017-09-02: qty 8.8

## 2017-09-02 MED ORDER — VITAMIN C 500 MG PO TABS
1000.0000 mg | ORAL_TABLET | Freq: Every day | ORAL | Status: DC
  Administered 2017-09-03 – 2017-09-04 (×2): 1000 mg via ORAL
  Filled 2017-09-02 (×2): qty 2

## 2017-09-02 MED ORDER — IPRATROPIUM-ALBUTEROL 0.5-2.5 (3) MG/3ML IN SOLN
3.0000 mL | Freq: Four times a day (QID) | RESPIRATORY_TRACT | Status: DC
  Administered 2017-09-02 – 2017-09-04 (×8): 3 mL via RESPIRATORY_TRACT
  Filled 2017-09-02 (×7): qty 3

## 2017-09-02 MED ORDER — PREDNISONE 10 MG PO TABS
10.0000 mg | ORAL_TABLET | Freq: Two times a day (BID) | ORAL | Status: AC
  Administered 2017-09-02 – 2017-09-03 (×4): 10 mg via ORAL
  Filled 2017-09-02 (×4): qty 1

## 2017-09-02 MED ORDER — MAGNESIUM SULFATE 2 GM/50ML IV SOLN
2.0000 g | Freq: Once | INTRAVENOUS | Status: AC
  Administered 2017-09-02: 2 g via INTRAVENOUS
  Filled 2017-09-02: qty 50

## 2017-09-02 MED ORDER — FUROSEMIDE 10 MG/ML IJ SOLN: 60.0000 mg | Freq: Two times a day (BID) | INTRAMUSCULAR | Status: DC

## 2017-09-02 MED ORDER — GUAIFENESIN ER 600 MG PO TB12
1200.0000 mg | ORAL_TABLET | Freq: Two times a day (BID) | ORAL | Status: DC
  Administered 2017-09-02 – 2017-09-04 (×5): 1200 mg via ORAL
  Filled 2017-09-02 (×5): qty 2

## 2017-09-02 MED ORDER — ALBUTEROL SULFATE (2.5 MG/3ML) 0.083% IN NEBU
3.0000 mL | INHALATION_SOLUTION | RESPIRATORY_TRACT | Status: DC | PRN
Start: 2017-09-02 — End: 2017-09-04

## 2017-09-02 MED ORDER — SODIUM CHLORIDE 0.9 % IV BOLUS
500.0000 mL | Freq: Once | INTRAVENOUS | Status: AC
  Administered 2017-09-02: 500 mL via INTRAVENOUS

## 2017-09-02 MED ORDER — FOLIC ACID 1 MG PO TABS
1.0000 mg | ORAL_TABLET | Freq: Every day | ORAL | Status: DC
  Administered 2017-09-02 – 2017-09-04 (×3): 1 mg via ORAL
  Filled 2017-09-02 (×3): qty 1

## 2017-09-02 MED ORDER — ACETAMINOPHEN 650 MG RE SUPP: 650.0000 mg | Freq: Four times a day (QID) | RECTAL | Status: DC | PRN

## 2017-09-02 MED ORDER — TRAZODONE HCL 50 MG PO TABS
25.0000 mg | ORAL_TABLET | Freq: Every evening | ORAL | Status: DC | PRN
  Administered 2017-09-03: 25 mg via ORAL
  Filled 2017-09-02 (×2): qty 1

## 2017-09-02 MED ORDER — POTASSIUM CHLORIDE CRYS ER 20 MEQ PO TBCR
40.0000 meq | EXTENDED_RELEASE_TABLET | Freq: Once | ORAL | Status: AC
  Administered 2017-09-02: 40 meq via ORAL
  Filled 2017-09-02: qty 2

## 2017-09-02 MED ORDER — CALCIUM-MAGNESIUM-VITAMIN D ER 600-40-500 MG-MG-UNIT PO TB24: 1.0000 | ORAL_TABLET | Freq: Every day | ORAL | Status: DC

## 2017-09-02 MED ORDER — IPRATROPIUM-ALBUTEROL 0.5-2.5 (3) MG/3ML IN SOLN: 3.0000 mL | Freq: Four times a day (QID) | RESPIRATORY_TRACT | Status: DC

## 2017-09-02 MED ORDER — ONDANSETRON HCL 4 MG PO TABS: 4.0000 mg | ORAL_TABLET | Freq: Four times a day (QID) | ORAL | Status: DC | PRN

## 2017-09-02 MED ORDER — ACETAMINOPHEN 325 MG PO TABS: 650.0000 mg | ORAL_TABLET | Freq: Four times a day (QID) | ORAL | Status: DC | PRN

## 2017-09-02 NOTE — Progress Notes (Signed)
Placed pt on BiPAP for the night. Resting comfortable on home setting of 14/8. RT will continue to monitor.

## 2017-09-02 NOTE — ED Provider Notes (Signed)
[facility] EMERGENCY DEPARTMENT Provider Note   CSN: [id number] Arrival date & time: 09/02/17  1314     History   Chief Complaint Chief Complaint  Patient presents with  . Shortness of Breath    HPI Chad Duke is a 72 y.o. male.  The history is provided by the patient. No language interpreter was used.  Shortness of Breath    Chad Duke is a 72 y.o. male who presents to the Emergency Department complaining of SOB. He presents to the emergency department for progressive shortness of breath and weakness. He has a history of COPD on 2 L oxygen at baseline as well as CHF, PE. He states that he is been experiencing progressive shortness of breath with lightheadedness and dizziness. He is been having difficulty with standing and getting out of his chair. Today he was so short of breath he was unable to reach his medicines to take them. His shortness of breath has been chronic but began to worsen in June. A few weeks ago he had a fall and hit his head and right side. Since the fall he has had right sided chest pain that is worse with coughing. He has a cough that is productive of clear sputum. No reports of fevers but he does have chills at times. He has chronic lower extremity edema with erythema to the right leg and he states that this is at his baseline. Past Medical History:  Diagnosis Date  . AAA (abdominal aortic aneurysm) (HCC)   . Allergy    Rhinitis  . Arthritis    Rhuematoid  . Asthma   . Chronic renal insufficiency   . Diverticulosis   . Emphysema   . GERD (gastroesophageal reflux disease)   . Hearing loss of both ears   . Hiatal hernia   . Hyperlipidemia   . Hypertension   . Male hypogonadism   . Nephrolithiasis   . Nephrolithiasis   . Sleep apnea    On CPAP  . Stasis dermatitis   . Varicose veins   . Vitamin B 12 deficiency     Patient Active Problem List   Diagnosis Date Noted  . Acute kidney injury superimposed on chronic kidney  disease (HCC) 09/02/2017  . Hypokalemia 09/02/2017  . Hypomagnesemia 09/02/2017  . Controlled type 2 diabetes mellitus with hyperglycemia (HCC) 09/02/2017  . Urinary incontinence 09/02/2017  . Fall 08/13/2017  . Acute renal failure superimposed on stage 3 chronic kidney disease (HCC) 08/13/2017  . HLD (hyperlipidemia) 08/13/2017  . Essential hypertension 08/13/2017  . GERD (gastroesophageal reflux disease) 08/13/2017  . Orthostatic hypotension 08/13/2017  . Deep vein thrombosis (DVT) of both lower extremities (HCC)   . Hyperglycemia   . Pulmonary embolus (HCC) 07/20/2017  . Chronic diastolic CHF (congestive heart failure) (HCC) 07/20/2017  . Bilateral leg edema 07/20/2017  . Acute exacerbation of chronic obstructive pulmonary disease (COPD) (HCC) 07/19/2017  . 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 Cigarette smoker 09/08/2016  . Morbid obesity due to excess calories (HCC) 09/08/2016  . Pain in joint, ankle and foot 03/31/2012  . Abnormality of gait 03/31/2012  . Rheumatoid arthritis (HCC) 03/31/2012  . COPD GOLD III  02/12/2011  . Sleep apnea 02/12/2011    Past Surgical History:  Procedure Laterality Date  . CARPAL TUNNEL RELEASE     bilateral  . FRACTURE SURGERY Left    Fibula  . JOINT REPLACEMENT Left    Shoulder  . JOINT REPLACEMENT Left    Hip   .  KIDNEY STONE SURGERY  2003  . left hip replacement    . left knee    . left shoulder    . LITHOTRIPSY          Home Medications    Prior to Admission medications   Medication Sig Start Date End Date Taking? Authorizing Provider  albuterol (PROVENTIL HFA;VENTOLIN HFA) 108 (90 Base) MCG/ACT inhaler Inhale 2 puffs into the lungs every 4 (four) hours as needed for wheezing or shortness of breath. 07/30/17  Yes Nyoka Cowden, MD  apixaban (ELIQUIS) 5 MG TABS tablet Take two-5mg  tablets (total 10mg ) twice daily for 7 days.  On day #8, starting taking one-5mg  tablet twice daily Patient taking differently: Take 5 mg by  mouth 2 (two) times daily.  07/23/17  Yes Randel Pigg, Dorma Russell, MD  Ascorbic Acid (VITAMIN C) 1000 MG tablet Take 1,000 mg by mouth daily.   Yes [provider]  aspirin EC 81 MG tablet Take 81 mg by mouth daily.   Yes [provider]  atorvastatin (LIPITOR) 40 MG tablet Take 1 tablet (40 mg total) by mouth daily. 05/18/17 09/02/17 Yes Lewayne Bunting, MD  Calcium-Magnesium-Vitamin D (CITRACAL CALCIUM+D) 600-40-500 MG-MG-UNIT TB24 Take 1 tablet by mouth daily.     Yes [provider]  cetirizine (ZYRTEC) 10 MG tablet Take 10 mg by mouth daily.     Yes [provider]  cyanocobalamin 2000 MCG tablet Take 2,500 mcg by mouth daily.    Yes [provider]  febuxostat (ULORIC) 40 MG tablet Take 40 mg by mouth daily.   Yes [provider]  folic acid (FOLVITE) 400 MCG tablet Take 1,200 mcg by mouth daily.   Yes [provider]  furosemide (LASIX) 40 MG tablet Take 40 mg by mouth 2 (two) times daily. 08/24/17  Yes [provider]  guaiFENesin (MUCINEX) 600 MG 12 hr tablet Take 1,200 mg by mouth 2 (two) times daily.   Yes [provider]  InFLIXimab (REMICADE IV) Inject into the vein every 6 (six) weeks.   Yes [provider]  ipratropium-albuterol (DUONEB) 0.5-2.5 (3) MG/3ML SOLN Take 3 mLs by nebulization every 6 (six) hours.    Yes [provider]  metFORMIN (GLUCOPHAGE) 500 MG tablet Take 1 tablet (500 mg total) by mouth 2 (two) times daily with a meal. 07/23/17 09/02/17 Yes Randel Pigg, Dorma Russell, MD  methotrexate (RHEUMATREX) 2.5 MG tablet Take 10 mg by mouth once a week. On Mondays 02/11/11  Yes [provider]  mometasone-formoterol (DULERA) 200-5 MCG/ACT AERO Inhale 2 puffs into the lungs 2 (two) times daily. 07/23/17  Yes Lenox Ponds, MD  Multiple Vitamin (MULTIVITAMIN) tablet Take 1 tablet by mouth daily.     Yes [provider]  OXYGEN BIPAP with 1lpm o2   Yes [provider]   pantoprazole (PROTONIX) 40 MG tablet Take 40 mg by mouth 2 (two) times daily.    Yes [provider]  potassium chloride (MICRO-K) 10 MEQ CR capsule Take 10 mEq by mouth 2 (two) times daily. 08/24/17  Yes [provider]  predniSONE (DELTASONE) 10 MG tablet Take 5 tablets for 4 days; Take 4 tablets for 3 days; then take 3 tablets daily. Patient taking differently: Take 10 mg by mouth 2 (two) times daily.  07/23/17  Yes Randel Pigg, Dorma Russell, MD  Tiotropium Bromide Monohydrate (SPIRIVA RESPIMAT) 2.5 MCG/ACT AERS Inhale 2 puffs into the lungs daily. 05/31/17  Yes Nyoka Cowden, MD  Respiratory Therapy  Supplies (FLUTTER) DEVI Use as directed 11/04/16   Nyoka Cowden, MD    Family History Family History  Problem Relation Age of Onset  . Emphysema Father   . Asthma Father   . Osteoarthritis Father   . Heart attack Mother   . Uterine cancer Mother   . Heart disease Mother   . ADD / ADHD Son   . Depression Son   . Hypertension Brother   . Heart disease Brother        Pacemaker   . Heart murmur Brother     Social History Social History   Tobacco Use  . Smoking status: Current Every Day Smoker    Packs/day: 0.10    Years: 53.00    Pack years: 5.30    Types: Cigarettes  . Smokeless tobacco: Never Used  . Tobacco comment: 1-2 cigarettes a day   Substance Use Topics  . Alcohol use: Not Currently    Alcohol/week: 0.0 oz  . Drug use: No     Allergies   Chantix [varenicline]   Review of Systems Review of Systems  Respiratory: Positive for shortness of breath.   All other systems reviewed and are negative.    Physical Exam Updated Vital Signs BP 111/79 (BP Location: Right Arm)   Pulse 100   Temp 97.7 F (36.5 C) (Oral)   Resp (!) 22   Ht 5\' 7"  (1.702 m)   Wt 125.2 kg (276 lb)   SpO2 92%   BMI 43.23 kg/m   Physical Exam  Constitutional: He is oriented to person, place, and time. He appears well-developed and well-nourished.  HENT:  Head:  Normocephalic.  Ecchymosis inferior to the right eye  Cardiovascular: Normal rate and regular rhythm.  No murmur heard. Pulmonary/Chest: Effort normal. No respiratory distress.  Scattered wheezes, rhonchi bilaterally  Abdominal: Soft. There is no tenderness. There is no rebound and no guarding.  Musculoskeletal:  3+ pitting edema to BLE, RLE with erythema  Neurological: He is alert and oriented to person, place, and time.  Skin: Skin is warm and dry.  Psychiatric: He has a normal mood and affect. His behavior is normal.  Nursing note and vitals reviewed.    ED Treatments / Results  Labs (all labs ordered are listed, but only abnormal results are displayed) Labs Reviewed  COMPREHENSIVE METABOLIC PANEL - Abnormal; Notable for the following components:      Result Value   Potassium 2.7 (*)    Chloride 83 (*)    Glucose, Bld 178 (*)    BUN 69 (*)    Creatinine, Ser 2.91 (*)    Calcium 8.7 (*)    Total Protein 6.4 (*)    Albumin 3.1 (*)    AST 77 (*)    ALT 121 (*)    GFR calc non Af Amer 20 (*)    GFR calc Af Amer 23 (*)    Anion gap 20 (*)    All other components within normal limits  CBC WITH DIFFERENTIAL/PLATELET - Abnormal; Notable for the following components:   RBC 3.43 (*)    Hemoglobin 11.9 (*)    HCT 35.1 (*)    MCV 102.3 (*)    MCH 34.7 (*)    RDW 17.4 (*)    Abs Immature Granulocytes 0.2 (*)    All other components within normal limits  TROPONIN I - Abnormal; Notable for the following components:   Troponin I 0.06 (*)    All other components within normal  limits  URINALYSIS, ROUTINE W REFLEX MICROSCOPIC - Abnormal; Notable for the following components:   APPearance HAZY (*)    Specific Gravity, Urine <1.005 (*)    Hgb urine dipstick SMALL (*)    All other components within normal limits  MAGNESIUM - Abnormal; Notable for the following components:   Magnesium 1.2 (*)    All other components within normal limits  URINALYSIS, MICROSCOPIC (REFLEX) -  Abnormal; Notable for the following components:   Bacteria, UA FEW (*)    Non Squamous Epithelial PRESENT (*)    All other components within normal limits  I-STAT CG4 LACTIC ACID, ED - Abnormal; Notable for the following components:   Lactic Acid, Venous 2.84 (*)    All other components within normal limits  URINE CULTURE  BRAIN NATRIURETIC PEPTIDE  HEMOGLOBIN A1C  I-STAT CG4 LACTIC ACID, ED    EKG EKG Interpretation  Date/Time:  Thursday September 02 2017 08:51:40 EDT Ventricular Rate:  104 PR Interval:  160 QRS Duration: 130 QT Interval:  388 QTC Calculation: 510 R Axis:   -130 Text Interpretation:  Sinus tachycardia with occasional Premature ventricular complexes and Fusion complexes Right bundle branch block Possible Lateral infarct , age undetermined Possible Inferior infarct , age undetermined Abnormal ECG Baseline wander Confirmed by Tilden Fossa [phone]) on 09/02/2017 8:55:37 AM   Radiology Dg Chest Port 1 View  Result Date: 09/02/2017 CLINICAL DATA:  Shortness of breath EXAM: PORTABLE CHEST 1 VIEW COMPARISON:  08/17/2017 FINDINGS: Cardiac shadow is stable. Lungs are well aerated bilaterally. Previously seen nodular density over the left lung base is no longer identified. Some mild atelectatic changes are noted bilaterally. No acute bony abnormality is seen. Postsurgical changes in left shoulder are noted. IMPRESSION: Mild bibasilar atelectasis. Electronically Signed   By: Alcide Clever M.D.   On: 09/02/2017 09:40    Procedures Procedures (including critical care time) CRITICAL CARE Performed by: Tilden Fossa   Total critical care time: 35 minutes  Critical care time was exclusive of separately billable procedures and treating other patients.  Critical care was necessary to treat or prevent imminent or life-threatening deterioration.  Critical care was time spent personally by me on the following activities: development of treatment plan with patient and/or surrogate  as well as nursing, discussions with consultants, evaluation of patient's response to treatment, examination of patient, obtaining history from patient or surrogate, ordering and performing treatments and interventions, ordering and review of laboratory studies, ordering and review of radiographic studies, pulse oximetry and re-evaluation of patient's condition.  Medications Ordered in ED Medications  albuterol (PROVENTIL) (2.5 MG/3ML) 0.083% nebulizer solution 3 mL (has no administration in time range)  apixaban (ELIQUIS) tablet 5 mg (5 mg Oral Given 09/02/17 1441)  atorvastatin (LIPITOR) tablet 40 mg (40 mg Oral Given 09/02/17 1440)  mometasone-formoterol (DULERA) 200-5 MCG/ACT inhaler 2 puff (has no administration in time range)  predniSONE (DELTASONE) tablet 10 mg (10 mg Oral Given 09/02/17 1439)  vitamin C (ASCORBIC ACID) tablet 1,000 mg (has no administration in time range)  aspirin EC tablet 81 mg (81 mg Oral Given 09/02/17 1439)  vitamin B-12 (CYANOCOBALAMIN) tablet 2,500 mcg (has no administration in time range)  febuxostat (ULORIC) tablet 40 mg (40 mg Oral Given 09/02/17 1440)  folic acid (FOLVITE) tablet 1 mg (1 mg Oral Given 09/02/17 1441)  guaiFENesin (MUCINEX) 12 hr tablet 1,200 mg (1,200 mg Oral Given 09/02/17 1440)  methotrexate (RHEUMATREX) tablet 10 mg (has no administration in time range)  pantoprazole (PROTONIX) EC tablet  40 mg (40 mg Oral Given 09/02/17 1441)  potassium chloride SA (K-DUR,KLOR-CON) CR tablet 20 mEq (20 mEq Oral Given 09/02/17 1439)  sodium chloride flush (NS) 0.9 % injection 3 mL (3 mLs Intravenous Given 09/02/17 1442)  acetaminophen (TYLENOL) tablet 650 mg (has no administration in time range)    Or  acetaminophen (TYLENOL) suppository 650 mg (has no administration in time range)  traMADol (ULTRAM) tablet 50 mg (has no administration in time range)  traZODone (DESYREL) tablet 25 mg (has no administration in time range)  ondansetron (ZOFRAN) tablet 4 mg (has no  administration in time range)    Or  ondansetron (ZOFRAN) injection 4 mg (has no administration in time range)  insulin aspart (novoLOG) injection 0-20 Units (has no administration in time range)  insulin aspart (novoLOG) injection 0-5 Units (has no administration in time range)  multivitamin with minerals tablet 1 tablet (1 tablet Oral Given 09/02/17 1440)  tiotropium (SPIRIVA) inhalation capsule 18 mcg (has no administration in time range)  calcium-vitamin D (OSCAL WITH D) 500-200 MG-UNIT per tablet 1 tablet (has no administration in time range)  ipratropium-albuterol (DUONEB) 0.5-2.5 (3) MG/3ML nebulizer solution 3 mL (3 mLs Nebulization Given 09/02/17 1524)  ipratropium-albuterol (DUONEB) 0.5-2.5 (3) MG/3ML nebulizer solution (  Canceled Entry 09/02/17 1524)  ipratropium-albuterol (DUONEB) 0.5-2.5 (3) MG/3ML nebulizer solution 3 mL (3 mLs Nebulization Given 09/02/17 0915)  hydrocortisone sodium succinate (SOLU-CORTEF) 100 MG injection 100 mg (100 mg Intravenous Given 09/02/17 1203)  magnesium sulfate IVPB 2 g 50 mL ( Intravenous Stopped 09/02/17 1312)  sodium chloride 0.9 % bolus 500 mL (0 mLs Intravenous Stopped 09/02/17 1326)  potassium chloride SA (K-DUR,KLOR-CON) CR tablet 40 mEq (40 mEq Oral Given 09/02/17 1204)     Initial Impression / Assessment and Plan / ED Course  I have reviewed the triage vital signs and the nursing notes.  Pertinent labs & imaging results that were available during my care of the patient were reviewed by me and considered in my medical decision making (see chart for details).     Patient with history of COPD, CHF, CKD here for evaluation of progressive weakness, shortness of breath and inability to ambulate. He is edematous on examination and he states that this is at his baseline. Labs demonstrate acute on chronic kidney injury with hypo candidemia, hypomanic ischemia. He missed his dose of prednisone today and he is on chronic steroids, concern for possible  adrenal insufficiency and he was treated with hydrocortisone. Plan to provide gentle fluid hydration and replace electrolytes. Hospitalist consulted for admission for further workup.  Final Clinical Impressions(s) / ED Diagnoses   Final diagnoses:  Chronic diastolic CHF (congestive heart failure) [facility])    ED Discharge Orders        Ordered    Amb Referral to HF Clinic     09/02/17 1245       Tilden Fossa, MD 09/02/17 1616

## 2017-09-02 NOTE — ED Triage Notes (Signed)
Per [facility] pt coming from home where he lives alone c/o shortness of breath since June. Patient did not have any daily medications today stating he was not able to get to them. 3+ pitting edema in bilateral lower extremities. 2L [place] continuously at home.

## 2017-09-02 NOTE — Social Work (Signed)
CSW acknowledging consult for SNF. Pt is currently admitted as observation pt. Will not qualify for SNF placement coverage through Medicare unless pt medically warrants a three night inpatient medically necessary stay.  Doy Hutching, LCSWA [facility] Clinical Social Work [phone]

## 2017-09-02 NOTE — ED Notes (Signed)
Pt has urinal at bedside. Pt aware that urine sample is needed. Pt unable to provide sample of urine at this time. Will try again later.

## 2017-09-02 NOTE — Consult Note (Signed)
Cardiology Consultation:   Patient ID: Chad Duke; [id number]; [birth year]   Admit date: 09/02/2017 Date of Consult: 09/02/2017  Primary Care Provider: [name], MD Primary Cardiologist: Olga Millers, MD  Primary Electrophysiologist:  none   Patient Profile:   Chad Duke is a 72 y.o. male with a hx of mild AAA 2.8 x 3.3 cm, three-vessel coronary artery calcification, normal EF, normal nuclear stress test who is being seen today for the evaluation of elevated troponin, chronic edema at the request of Dr. Willette Pa.  History of Present Illness:   Mr. Chad Duke is a 72 year old male seen 3 months ago by Dr. Jens Som and cardiology with history of AAA 2.8 x 3.3 cm, echo in February 2019 showing normal LV function with mild diastolic dysfunction, CT of chest showing three-vessel coronary artery calcification with subsequent nuclear stress test showing no evidence of ischemia, normal EF with chronic pedal edema.  Previously has not had any significant dyspnea on exertion orthopnea PND chest pain.  He came into the emergency department with complaints of dizziness lightheadedness weakness similar to prior admission in July and June.  Challenging home situation, difficulty taking care of himself.  He was getting Lasix at some point for his chronic lower extremity edema and his primary physician, Dr. Wynelle Link was going to continue to monitor his creatinine.  Hypokalemia and elevated creatinine was noted at one point.  At discharge from hospital last time, he was instructed to stop Lasix.  Dr. Wynelle Link restarted it twice a day because of his increasing lower extremity edema.  Looks like he was hypovolemic, prerenal on admission.  Falls.  Incontinence.  Difficulty standing.  Hypokalemic.  Past Medical History:  Diagnosis Date  . AAA (abdominal aortic aneurysm) (HCC)   . Allergy    Rhinitis  . Arthritis    Rhuematoid  . Asthma   . Chronic renal insufficiency   . Diverticulosis   . Emphysema   . GERD  (gastroesophageal reflux disease)   . Hearing loss of both ears   . Hiatal hernia   . Hyperlipidemia   . Hypertension   . Male hypogonadism   . Nephrolithiasis   . Nephrolithiasis   . Sleep apnea    On CPAP  . Stasis dermatitis   . Varicose veins   . Vitamin B 12 deficiency     Past Surgical History:  Procedure Laterality Date  . CARPAL TUNNEL RELEASE     bilateral  . FRACTURE SURGERY Left    Fibula  . JOINT REPLACEMENT Left    Shoulder  . JOINT REPLACEMENT Left    Hip   . KIDNEY STONE SURGERY  2003  . left hip replacement    . left knee    . left shoulder    . LITHOTRIPSY       Home Medications:  Prior to Admission medications   Medication Sig Start Date End Date Taking? Authorizing Provider  albuterol (PROVENTIL HFA;VENTOLIN HFA) 108 (90 Base) MCG/ACT inhaler Inhale 2 puffs into the lungs every 4 (four) hours as needed for wheezing or shortness of breath. 07/30/17  Yes Nyoka Cowden, MD  apixaban (ELIQUIS) 5 MG TABS tablet Take two-5mg  tablets (total 10mg ) twice daily for 7 days.  On day #8, starting taking one-5mg  tablet twice daily Patient taking differently: Take 5 mg by mouth 2 (two) times daily.  07/23/17  Yes Randel Pigg, Dorma Russell, MD  Ascorbic Acid (VITAMIN C) 1000 MG tablet Take 1,000 mg by mouth daily.   Yes [provider]  aspirin EC 81 MG tablet Take 81 mg by mouth daily.   Yes [provider]  atorvastatin (LIPITOR) 40 MG tablet Take 1 tablet (40 mg total) by mouth daily. 05/18/17 09/02/17 Yes Lewayne Bunting, MD  Calcium-Magnesium-Vitamin D (CITRACAL CALCIUM+D) 600-40-500 MG-MG-UNIT TB24 Take 1 tablet by mouth daily.     Yes [provider]  cetirizine (ZYRTEC) 10 MG tablet Take 10 mg by mouth daily.     Yes [provider]  cyanocobalamin 2000 MCG tablet Take 2,500 mcg by mouth daily.    Yes [provider]  febuxostat (ULORIC) 40 MG tablet Take 40 mg by mouth daily.   Yes [provider]  folic acid  (FOLVITE) 400 MCG tablet Take 1,200 mcg by mouth daily.   Yes [provider]  furosemide (LASIX) 40 MG tablet Take 40 mg by mouth 2 (two) times daily. 08/24/17  Yes [provider]  guaiFENesin (MUCINEX) 600 MG 12 hr tablet Take 1,200 mg by mouth 2 (two) times daily.   Yes [provider]  InFLIXimab (REMICADE IV) Inject into the vein every 6 (six) weeks.   Yes [provider]  ipratropium-albuterol (DUONEB) 0.5-2.5 (3) MG/3ML SOLN Take 3 mLs by nebulization every 6 (six) hours.    Yes [provider]  metFORMIN (GLUCOPHAGE) 500 MG tablet Take 1 tablet (500 mg total) by mouth 2 (two) times daily with a meal. 07/23/17 09/02/17 Yes Randel Pigg, Dorma Russell, MD  methotrexate (RHEUMATREX) 2.5 MG tablet Take 10 mg by mouth once a week. On Mondays 02/11/11  Yes [provider]  mometasone-formoterol (DULERA) 200-5 MCG/ACT AERO Inhale 2 puffs into the lungs 2 (two) times daily. 07/23/17  Yes Lenox Ponds, MD  Multiple Vitamin (MULTIVITAMIN) tablet Take 1 tablet by mouth daily.     Yes [provider]  OXYGEN BIPAP with 1lpm o2   Yes [provider]  pantoprazole (PROTONIX) 40 MG tablet Take 40 mg by mouth 2 (two) times daily.    Yes [provider]  potassium chloride (MICRO-K) 10 MEQ CR capsule Take 10 mEq by mouth 2 (two) times daily. 08/24/17  Yes [provider]  predniSONE (DELTASONE) 10 MG tablet Take 5 tablets for 4 days; Take 4 tablets for 3 days; then take 3 tablets daily. Patient taking differently: Take 10 mg by mouth 2 (two) times daily.  07/23/17  Yes Randel Pigg, Dorma Russell, MD  Tiotropium Bromide Monohydrate (SPIRIVA RESPIMAT) 2.5 MCG/ACT AERS Inhale 2 puffs into the lungs daily. 05/31/17  Yes Nyoka Cowden, MD  Respiratory Therapy Supplies (FLUTTER) DEVI Use as directed 11/04/16   Nyoka Cowden, MD    Inpatient Medications: Scheduled Meds: . apixaban  5 mg Oral BID  . aspirin EC  81 mg Oral Daily  .  atorvastatin  40 mg Oral Daily  . [START ON 09/03/2017] calcium-vitamin D  1 tablet Oral Q breakfast  . febuxostat  40 mg Oral Daily  . folic acid  1 mg Oral Daily  . guaiFENesin  1,200 mg Oral BID  . insulin aspart  0-20 Units Subcutaneous TID WC  . insulin aspart  0-5 Units Subcutaneous QHS  . ipratropium-albuterol  3 mL Nebulization Q6H  . [START ON 09/06/2017] methotrexate  10 mg Oral Weekly  . mometasone-formoterol  2 puff Inhalation BID  . multivitamin with minerals  1 tablet Oral Daily  . pantoprazole  40 mg Oral BID  . potassium chloride  20 mEq Oral TID  . predniSONE  10 mg Oral BID  . sodium chloride flush  3 mL Intravenous Q12H  . [START ON 09/03/2017] tiotropium  18 mcg Inhalation Daily  . [START ON 09/03/2017] cyanocobalamin  2,500 mcg Oral Daily  . vitamin C  1,000 mg Oral Daily   Continuous Infusions:  PRN Meds: acetaminophen **OR** acetaminophen, albuterol, ondansetron **OR** ondansetron (ZOFRAN) IV, traMADol, traZODone  Allergies:    Allergies  Allergen Reactions  . Chantix [Varenicline] Other (See Comments)    Suicidal thoughts    Social History:   Social History   Socioeconomic History  . Marital status: Divorced    Spouse name: Not on file  . Number of children: 2  . Years of education: Not on file  . Highest education level: Not on file  Occupational History  . Not on file  Social Needs  . Financial resource strain: Not on file  . Food insecurity:    Worry: Not on file    Inability: Not on file  . Transportation needs:    Medical: Not on file    Non-medical: Not on file  Tobacco Use  . Smoking status: Current Every Day Smoker    Packs/day: 0.10    Years: 53.00    Pack years: 5.30    Types: Cigarettes  . Smokeless tobacco: Never Used  . Tobacco comment: 1-2 cigarettes a day   Substance and Sexual Activity  . Alcohol use: Not Currently    Alcohol/week: 0.0 oz  . Drug use: No  . Sexual activity: Not on file  Lifestyle  . Physical activity:     Days per week: Not on file    Minutes per session: Not on file  . Stress: Not on file  Relationships  . Social connections:    Talks on phone: Not on file    Gets together: Not on file    Attends religious service: Not on file    Active member of club or organization: Not on file    Attends meetings of clubs or organizations: Not on file    Relationship status: Not on file  . Intimate partner violence:    Fear of current or ex partner: Not on file    Emotionally abused: Not on file    Physically abused: Not on file    Forced sexual activity: Not on file  Other Topics Concern  . Not on file  Social History Narrative  . Not on file    Family History:    Family History  Problem Relation Age of Onset  . Emphysema Father   . Asthma Father   . Osteoarthritis Father   . Heart attack Mother   . Uterine cancer Mother   . Heart disease Mother   . ADD / ADHD Son   . Depression Son   . Hypertension Brother   . Heart disease Brother        Pacemaker   . Heart murmur Brother      ROS:  Please see the history of present illness.   All other ROS reviewed and negative.     Physical Exam/Data:   Vitals:   09/02/17 1200 09/02/17 1230 09/02/17 1300 09/02/17 1400  BP: 105/79 103/80 115/74 111/79  Pulse: (!) 102 100 100 100  Resp: 16 16 16  (!) 22  Temp:    97.7 F (36.5 C)  TempSrc:    Oral  SpO2: 98% 100% 98% 100%  Weight:      Height:  Intake/Output Summary (Last 24 hours) at 09/02/2017 1444 Last data filed at 09/02/2017 1326 Gross per 24 hour  Intake 550 ml  Output -  Net 550 ml   Filed Weights   09/02/17 0843  Weight: 276 lb (125.2 kg)   Body mass index is 43.23 kg/m.  General:  Well nourished, well developed, in no acute distress, obese HEENT: normal Lymph: no adenopathy Neck: no JVD Endocrine:  No thryomegaly Vascular: No carotid bruits; FA pulses 2+ bilaterally without bruits  Cardiac:  normal S1, S2; RRR; no murmur  Lungs:  clear to  auscultation bilaterally, no wheezing, rhonchi or rales  Abd: soft, nontender, no hepatomegaly  Ext: Chronic lower extremity edema Musculoskeletal:  No deformities, BUE and BLE strength normal and equal Skin: warm and dry  Neuro:  CNs 2-12 intact, no focal abnormalities noted Psych:  Normal affect   EKG:  The EKG was personally reviewed and demonstrates: Sinus rhythm right bundle branch block left anterior fascicular block no significant change since prior  Telemetry:  Telemetry was personally reviewed and demonstrates: No adverse arrhythmias  Relevant CV Studies: As above in HPI  Laboratory Data:  Chemistry Recent Labs  Lab 09/02/17 0907  NA 135  K 2.7*  CL 83*  CO2 32  GLUCOSE 178*  BUN 69*  CREATININE 2.91*  CALCIUM 8.7*  GFRNONAA 20*  GFRAA 23*  ANIONGAP 20*    Recent Labs  Lab 09/02/17 0907  PROT 6.4*  ALBUMIN 3.1*  AST 77*  ALT 121*  ALKPHOS 71  BILITOT 1.2   Hematology Recent Labs  Lab 09/02/17 0907  WBC 7.6  RBC 3.43*  HGB 11.9*  HCT 35.1*  MCV 102.3*  MCH 34.7*  MCHC 33.9  RDW 17.4*  PLT 203   Cardiac Enzymes Recent Labs  Lab 09/02/17 0907  TROPONINI 0.06*   No results for input(s): TROPIPOC in the last 168 hours.  BNP Recent Labs  Lab 09/02/17 0907  BNP 60.2    DDimer No results for input(s): DDIMER in the last 168 hours.  Radiology/Studies:  Dg Chest Port 1 View  Result Date: 09/02/2017 CLINICAL DATA:  Shortness of breath EXAM: PORTABLE CHEST 1 VIEW COMPARISON:  08/17/2017 FINDINGS: Cardiac shadow is stable. Lungs are well aerated bilaterally. Previously seen nodular density over the left lung base is no longer identified. Some mild atelectatic changes are noted bilaterally. No acute bony abnormality is seen. Postsurgical changes in left shoulder are noted. IMPRESSION: Mild bibasilar atelectasis. Electronically Signed   By: Alcide Clever M.D.   On: 09/02/2017 09:40    Assessment and Plan:   Acute kidney injury - Stop diuretics.   Lower extremity edema is likely dependent, chronic would continue conservative measures, compression stockings, elevation.  Agree with stopping any blood pressure medications, stopped triamterene hydrochlorothiazide.  The thiazide diuretic combination with loop diuretic can often result in profound hypokalemia and hyponatremia.  Chronic diastolic heart failure - Grade 1 diastolic dysfunction to be expected for his age.  EF normal.  Reassuring.  BNP normal.  Coronary artery calcification - Recommend continued statin therapy, secondary prevention.  Nuclear stress test reassuring.  Bilateral lower extremity edema - Prior history of DVT, agree venous insufficiency, dependent edema.  At this point, avoid Lasix as this is harming him.  Tobacco use - Cessation  AAA -We will follow as outpatient with repeat ultrasound Morbid obesity -Continue to encourage weight loss.  This is the crux of his issues.  Diabetes with hypertension -Weight loss.  Elevated troponin -  0.06, low level.  Related to acute kidney injury.  Weakness -Severe hypokalemia contributing.  Stop Lasix.  Good nutrition.  This will help with albumin which would help with his third spacing, edema.  [facility] will sign off.   Medication Recommendations: Avoid Lasix in the future. Other recommendations (labs, testing, etc): Conservative management for lower extremity edema, weight loss Follow up as an outpatient: Continue to follow with primary care physician.   For questions or updates, please contact [facility] Please consult [email or web address] for contact info under Cardiology/STEMI.   Signed, Donato Schultz, MD  09/02/2017 2:44 PM

## 2017-09-02 NOTE — H&P (Signed)
History and Physical    Chad Duke FAO:[id number] DOB: 1945-12-03 DOA: 09/02/2017  PCP: [name], MD  Patient coming from: Home  I have personally briefly reviewed patient's old medical records in [facility]  Chief Complaint: This of breath since June.  Able to get his medications (he physically had them could not get to them)  HPI: Chad Duke is a 72 y.o. male with medical history significant of stylet congestive heart failure, hypertension, hyperlipidemia, diabetes mellitus type 2, on insulin, COPD on 2 L nasal cannula oxygen, gastroesophageal reflux disease, gout, rheumatoid arthritis on prednisone, DVT/PE on Eliquis, abdominal aortic aneurysm, chronic kidney disease stage III, varicose veins, obstructive sleep apnea on BiPAP who complains of dizziness and lightheadedness exactly the same as his previous admission on July 5.  Third admission since June 10 and fourth visit to the emergency department.  Patient states he is having difficulty caring for himself at home.  Last admission he was evaluated by physical therapy for possible skilled nursing facility placement but was to functional to qualify and was referred to home health.  He had recommended assisted living but that does not appear to have been pursued by the patient.  The emergency department he was evaluated and found to have tachycardia, hypokalemia, a creatinine that elevated from his baseline of 1.5-1.7 that is now 2.91.  Hemoglobin of 11.9 with an elevated MCV, a magnesium level of 1.2 and elevated LFTs.  At discharge from our hospital last time the patient was instructed to stop his Lasix.  He saw Dr. Wynelle Link in follow-up who recommended he restart his Lasix at 40 mg twice daily due to increasing lower extremity edema.  Edematous, with renal failure, hypokalemic hypomagnesemic and with a troponin suggestive of strain (no chest pain).  Truly difficult to ascertain if the patient has been able to take any of his medications at  home.  In the emergency department he stated that he was experiencing progressive shortness of breath with lightheadedness and dizziness.  He is been having difficulty with standing and getting out of his chair due to dizziness.  Today he was so short of breath he was unable to reach his medicines to take them.  Shortness of breath is chronic but became worse in June.  A few weeks ago he had a fall which hit his head and since then he is having having difficulties with coughing.  Also mentions urinary incontinence and in the emergency department a bladder scan which showed 206 mL.  Review of Systems: As per HPI otherwise all other systems reviewed and  negative.    Past Medical History:  Diagnosis Date  . AAA (abdominal aortic aneurysm) (HCC)   . Allergy    Rhinitis  . Arthritis    Rhuematoid  . Asthma   . Chronic renal insufficiency   . Diverticulosis   . Emphysema   . GERD (gastroesophageal reflux disease)   . Hearing loss of both ears   . Hiatal hernia   . Hyperlipidemia   . Hypertension   . Male hypogonadism   . Nephrolithiasis   . Nephrolithiasis   . Sleep apnea    On CPAP  . Stasis dermatitis   . Varicose veins   . Vitamin B 12 deficiency     Past Surgical History:  Procedure Laterality Date  . CARPAL TUNNEL RELEASE     bilateral  . FRACTURE SURGERY Left    Fibula  . JOINT REPLACEMENT Left    Shoulder  .  JOINT REPLACEMENT Left    Hip   . KIDNEY STONE SURGERY  2003  . left hip replacement    . left knee    . left shoulder    . LITHOTRIPSY      Social History   Social History Narrative  . Not on file     reports that he has been smoking cigarettes.  He has a 5.30 pack-year smoking history. He has never used smokeless tobacco. He reports that he drank alcohol. He reports that he does not use drugs.  Allergies  Allergen Reactions  . Chantix [Varenicline] Other (See Comments)    Suicidal thoughts    Family History  Problem Relation Age of Onset  .  Emphysema Father   . Asthma Father   . Osteoarthritis Father   . Heart attack Mother   . Uterine cancer Mother   . Heart disease Mother   . ADD / ADHD Son   . Depression Son   . Hypertension Brother   . Heart disease Brother        Pacemaker   . Heart murmur Brother      Prior to Admission medications   Medication Sig Start Date End Date Taking? Authorizing Provider  albuterol (PROVENTIL HFA;VENTOLIN HFA) 108 (90 Base) MCG/ACT inhaler Inhale 2 puffs into the lungs every 4 (four) hours as needed for wheezing or shortness of breath. 07/30/17  Yes Nyoka Cowden, MD  apixaban (ELIQUIS) 5 MG TABS tablet Take two-5mg  tablets (total 10mg ) twice daily for 7 days.  On day #8, starting taking one-5mg  tablet twice daily Patient taking differently: Take 5 mg by mouth 2 (two) times daily.  07/23/17  Yes Randel Pigg, Dorma Russell, MD  Ascorbic Acid (VITAMIN C) 1000 MG tablet Take 1,000 mg by mouth daily.   Yes [provider]  aspirin EC 81 MG tablet Take 81 mg by mouth daily.   Yes [provider]  atorvastatin (LIPITOR) 40 MG tablet Take 1 tablet (40 mg total) by mouth daily. 05/18/17 09/02/17 Yes Lewayne Bunting, MD  Calcium-Magnesium-Vitamin D (CITRACAL CALCIUM+D) 600-40-500 MG-MG-UNIT TB24 Take 1 tablet by mouth daily.     Yes [provider]  cetirizine (ZYRTEC) 10 MG tablet Take 10 mg by mouth daily.     Yes [provider]  cyanocobalamin 2000 MCG tablet Take 2,500 mcg by mouth daily.    Yes [provider]  febuxostat (ULORIC) 40 MG tablet Take 40 mg by mouth daily.   Yes [provider]  folic acid (FOLVITE) 400 MCG tablet Take 1,200 mcg by mouth daily.   Yes [provider]  furosemide (LASIX) 40 MG tablet Take 40 mg by mouth 2 (two) times daily. 08/24/17  Yes [provider]  guaiFENesin (MUCINEX) 600 MG 12 hr tablet Take 1,200 mg by mouth 2 (two) times daily.   Yes [provider]  InFLIXimab (REMICADE IV) Inject  into the vein every 6 (six) weeks.   Yes [provider]  ipratropium-albuterol (DUONEB) 0.5-2.5 (3) MG/3ML SOLN Take 3 mLs by nebulization every 6 (six) hours.    Yes [provider]  metFORMIN (GLUCOPHAGE) 500 MG tablet Take 1 tablet (500 mg total) by mouth 2 (two) times daily with a meal. 07/23/17 09/02/17 Yes Randel Pigg, Dorma Russell, MD  methotrexate (RHEUMATREX) 2.5 MG tablet Take 10 mg by mouth once a week. On Mondays 02/11/11  Yes [provider]  mometasone-formoterol (DULERA) 200-5 MCG/ACT AERO Inhale 2 puffs into the lungs 2 (two)  times daily. 07/23/17  Yes Lenox Ponds, MD  Multiple Vitamin (MULTIVITAMIN) tablet Take 1 tablet by mouth daily.     Yes [provider]  OXYGEN BIPAP with 1lpm o2   Yes [provider]  pantoprazole (PROTONIX) 40 MG tablet Take 40 mg by mouth 2 (two) times daily.    Yes [provider]  potassium chloride (MICRO-K) 10 MEQ CR capsule Take 10 mEq by mouth 2 (two) times daily. 08/24/17  Yes [provider]  predniSONE (DELTASONE) 10 MG tablet Take 5 tablets for 4 days; Take 4 tablets for 3 days; then take 3 tablets daily. Patient taking differently: Take 10 mg by mouth 2 (two) times daily.  07/23/17  Yes Randel Pigg, Dorma Russell, MD  Tiotropium Bromide Monohydrate (SPIRIVA RESPIMAT) 2.5 MCG/ACT AERS Inhale 2 puffs into the lungs daily. 05/31/17  Yes Nyoka Cowden, MD  Respiratory Therapy Supplies (FLUTTER) DEVI Use as directed 11/04/16   Nyoka Cowden, MD    Physical Exam:  Constitutional: NAD, calm, comfortable lying in a semi-Fowlers position with no increased use of accessory muscles of respiration. Vitals:   09/02/17 1200 09/02/17 1230 09/02/17 1300 09/02/17 1400  BP: 105/79 103/80 115/74 111/79  Pulse: (!) 102 100 100 100  Resp: 16 16 16  (!) 22  Temp:    97.7 F (36.5 C)  TempSrc:    Oral  SpO2: 98% 100% 98% 100%  Weight:      Height:       Eyes: PERRL, lids and conjunctivae normal ENMT:  Mucous membranes are moist. Posterior pharynx clear of any exudate or lesions.Normal dentition.  Neck: normal, supple, no masses, no thyromegaly, very thick Respiratory: clear to auscultation bilaterally, no wheezing, bilateral crackles at the bases with decreased air movement normal respiratory effort. No accessory muscle use.  Cardiovascular: Regular rate and rhythm, no murmurs / rubs / gallops.  3+ pitting extremity edema laterally. 2+ pedal pulses. No carotid bruits.  Abdomen: no tenderness, no masses palpated. No hepatosplenomegaly. Bowel sounds positive.  Musculoskeletal: no clubbing / cyanosis. No joint deformity upper and lower extremities. Good ROM, no contractures. Normal muscle tone.  Skin: no rashes, lesions, ulcers. No induration Neurologic: CN 2-12 grossly intact. Sensation intact, DTR normal. Strength 5/5 in all 4.  Psychiatric: Normal judgment and insight. Alert and oriented x 3. Normal mood.     Labs on Admission: I have personally reviewed following labs and imaging studies  CBC: Recent Labs  Lab 09/02/17 0907  WBC 7.6  NEUTROABS 6.0  HGB 11.9*  HCT 35.1*  MCV 102.3*  PLT 203   Basic Metabolic Panel: Recent Labs  Lab 09/02/17 0907  NA 135  K 2.7*  CL 83*  CO2 32  GLUCOSE 178*  BUN 69*  CREATININE 2.91*  CALCIUM 8.7*  MG 1.2*   GFR: Estimated Creatinine Clearance: 29.1 mL/min (A) (by C-G formula based on SCr of 2.91 mg/dL (H)). Liver Function Tests: Recent Labs  Lab 09/02/17 0907  AST 77*  ALT 121*  ALKPHOS 71  BILITOT 1.2  PROT 6.4*  ALBUMIN 3.1*   Cardiac Enzymes: Recent Labs  Lab 09/02/17 0907  TROPONINI 0.06*   Urine analysis:    Component Value Date/Time   COLORURINE YELLOW 09/02/2017 1138   APPEARANCEUR HAZY (A) 09/02/2017 1138   LABSPEC <1.005 (L) 09/02/2017 1138   PHURINE 6.0 09/02/2017 1138   GLUCOSEU NEGATIVE 09/02/2017 1138   HGBUR SMALL (A) 09/02/2017 1138   BILIRUBINUR NEGATIVE 09/02/2017 1138   KETONESUR NEGATIVE  09/02/2017 1138   PROTEINUR NEGATIVE 09/02/2017 1138   UROBILINOGEN 0.2 03/28/2007 1330   NITRITE NEGATIVE 09/02/2017 1138   LEUKOCYTESUR NEGATIVE 09/02/2017 1138    Radiological Exams on Admission: Dg Chest Port 1 View  Result Date: 09/02/2017 CLINICAL DATA:  Shortness of breath EXAM: PORTABLE CHEST 1 VIEW COMPARISON:  08/17/2017 FINDINGS: Cardiac shadow is stable. Lungs are well aerated bilaterally. Previously seen nodular density over the left lung base is no longer identified. Some mild atelectatic changes are noted bilaterally. No acute bony abnormality is seen. Postsurgical changes in left shoulder are noted. IMPRESSION: Mild bibasilar atelectasis. Electronically Signed   By: Alcide Clever M.D.   On: 09/02/2017 09:40    EKG: Independently reviewed.  Sinus tachycardia with PVCs, right bundle branch block, lateral and inferior infarct of indeterminate age  Assessment/Plan Principal Problem:   Acute renal failure superimposed on stage 3 chronic kidney disease (HCC) Active Problems:   Orthostatic hypotension   Hypokalemia   Hypomagnesemia   Rheumatoid arthritis (HCC)   Chronic diastolic CHF (congestive heart failure) (HCC)   Bilateral leg edema   Controlled type 2 diabetes mellitus with hyperglycemia (HCC)   Urinary incontinence   COPD GOLD III    Sleep apnea   Morbid obesity due to excess calories (HCC)     1.  Acute renal failure superimposed on stage III chronic kidney disease: Situation is becoming more difficult as the patient appears to be developing a cardiorenal syndrome.  Consulted cardiology who will see the patient in consultation.  He may require nephrology consultation.  This point he seems to be volume overloaded and seems to require more fluid however his orthostatics are abnormal and it seems that conversely fluid should help him.  I think at this point we will hold some of his blood pressure medications and monitor response.  2.  Orthostatic hypotension: Patient  with an EF of 65 to 70% with grade 1 diastolic dysfunction in June 2019.  This was demonstrated even after he received IV fluids.  The symptoms are much worse at the present time.  We will check orthostatics and monitor closely.  3.  Hypokalemia: Will treat with oral and IV replacement will try to keep potassium levels greater than 4.  4.  Hypomagnesemia: Will treat with oral and IV magnesium.  We will try to keep magnesium levels greater than 2.  5.  Rheumatoid arthritis: Patient is on prednisone twice daily he is on a taper which is decreasing.  He will receive 4 more doses.  6.  Chronic diastolic congestive heart failure: Ejection fraction was acceptable in June 2019 with an EF of 65 to 70%.  He has grade 1 diastolic dysfunction.  Currently not on any medications which would affect his cardiac function except for Lasix.  Will defer to cardiology.  7.  Bilateral lower extremity edema: Patient has a history of DVT and PE some of this may be related to venous insufficiency where some of the edema may be related to cardiac issues would recommend compression hose and TED stockings which I will order.  8.  Controlled type 2 diabetes mellitus with hyperglycemia: Patient takes metformin at home as well as monitoring his blood glucoses twice daily.  Will hold metformin given hospitalization and start sliding scale insulin coverage.  9.  Urinary incontinence: This is a recent problem for this patient.  He was bladder scanned in the emergency department found to have 209 mL.  We will continue post void residuals.  We will  continue bladder training every 4 hours.  10.  COPD Gold 3: Continue home medications no evidence of wheezing.  11.  Sleep apnea: Patient apparently uses a BiPAP at home.  I have ordered one for use here.  He does use 2 L of oxygen bleed in.  12.  Morbid obesity due to excess calories: Dietary restriction and judicious eating is recommended.   DVT prophylaxis: Anticoagulated with  Eliquis Code Status: DO NOT RESUSCITATE Family Communication: Patient states that his children live out of state he has no family here just a neighbor next door Disposition Plan: This is going to be the most difficult part of his hospitalization.  Clearly patient needs assistance.  Assisted living would be the best for him if he qualifies and cannot afford it.  We will ask case management for assistance he may qualify for skilled facility.  I have ordered PT OT. Consults called: *Cardiology Admission status: Inpatient   Lahoma Crocker MD FACP [facility] Pager [phone]  If 7PM-7AM, please contact night-coverage [email or web address] Password [facility]  09/02/2017, 2:07 PM

## 2017-09-02 NOTE — ED Notes (Signed)
Pt bladder scan showed 206 ml.

## 2017-09-02 NOTE — ED Notes (Signed)
Date and time results received: 09/02/17    Test: K+ Critical Value: 2.7  Name of Provider Notified: Madilyn Hook  Orders Received? Or Actions Taken?: Add on Magnesium level

## 2017-09-02 NOTE — ED Notes (Signed)
Patient ambulated beside bed and attempted urine specimen.

## 2017-09-03 DIAGNOSIS — R945 Abnormal results of liver function studies: Secondary | ICD-10-CM

## 2017-09-03 DIAGNOSIS — M069 Rheumatoid arthritis, unspecified: Secondary | ICD-10-CM | POA: Diagnosis not present

## 2017-09-03 DIAGNOSIS — I130 Hypertensive heart and chronic kidney disease with heart failure and stage 1 through stage 4 chronic kidney disease, or unspecified chronic kidney disease: Secondary | ICD-10-CM | POA: Diagnosis not present

## 2017-09-03 DIAGNOSIS — I951 Orthostatic hypotension: Secondary | ICD-10-CM | POA: Diagnosis not present

## 2017-09-03 DIAGNOSIS — D899 Disorder involving the immune mechanism, unspecified: Secondary | ICD-10-CM | POA: Diagnosis not present

## 2017-09-03 DIAGNOSIS — J449 Chronic obstructive pulmonary disease, unspecified: Secondary | ICD-10-CM | POA: Diagnosis not present

## 2017-09-03 DIAGNOSIS — R600 Localized edema: Secondary | ICD-10-CM | POA: Diagnosis not present

## 2017-09-03 DIAGNOSIS — N183 Chronic kidney disease, stage 3 (moderate): Secondary | ICD-10-CM

## 2017-09-03 DIAGNOSIS — I5032 Chronic diastolic (congestive) heart failure: Secondary | ICD-10-CM | POA: Diagnosis not present

## 2017-09-03 DIAGNOSIS — Z7189 Other specified counseling: Secondary | ICD-10-CM

## 2017-09-03 DIAGNOSIS — E1122 Type 2 diabetes mellitus with diabetic chronic kidney disease: Secondary | ICD-10-CM | POA: Diagnosis not present

## 2017-09-03 DIAGNOSIS — Z515 Encounter for palliative care: Secondary | ICD-10-CM | POA: Diagnosis not present

## 2017-09-03 DIAGNOSIS — E876 Hypokalemia: Secondary | ICD-10-CM | POA: Diagnosis not present

## 2017-09-03 DIAGNOSIS — Z6834 Body mass index (BMI) 34.0-34.9, adult: Secondary | ICD-10-CM | POA: Diagnosis not present

## 2017-09-03 DIAGNOSIS — N179 Acute kidney failure, unspecified: Secondary | ICD-10-CM | POA: Diagnosis not present

## 2017-09-03 DIAGNOSIS — R32 Unspecified urinary incontinence: Secondary | ICD-10-CM | POA: Diagnosis not present

## 2017-09-03 LAB — GLUCOSE, CAPILLARY
Glucose-Capillary: 173 mg/dL — ABNORMAL HIGH (ref 70–99)
Glucose-Capillary: 173 mg/dL — ABNORMAL HIGH (ref 70–99)
Glucose-Capillary: 238 mg/dL — ABNORMAL HIGH (ref 70–99)
Glucose-Capillary: 252 mg/dL — ABNORMAL HIGH (ref 70–99)

## 2017-09-03 LAB — CBC
HCT: 32.1 % — ABNORMAL LOW (ref 39.0–52.0)
Hemoglobin: 10.6 g/dL — ABNORMAL LOW (ref 13.0–17.0)
MCH: 33.8 pg (ref 26.0–34.0)
MCHC: 33 g/dL (ref 30.0–36.0)
MCV: 102.2 fL — ABNORMAL HIGH (ref 78.0–100.0)
Platelets: 198 10*3/uL (ref 150–400)
RBC: 3.14 MIL/uL — ABNORMAL LOW (ref 4.22–5.81)
RDW: 17.3 % — ABNORMAL HIGH (ref 11.5–15.5)
WBC: 7.9 10*3/uL (ref 4.0–10.5)

## 2017-09-03 LAB — COMPREHENSIVE METABOLIC PANEL
ALT: 113 U/L — ABNORMAL HIGH (ref 0–44)
AST: 67 U/L — ABNORMAL HIGH (ref 15–41)
Albumin: 2.9 g/dL — ABNORMAL LOW (ref 3.5–5.0)
Alkaline Phosphatase: 66 U/L (ref 38–126)
Anion gap: 15 (ref 5–15)
BUN: 66 mg/dL — ABNORMAL HIGH (ref 8–23)
CO2: 32 mmol/L (ref 22–32)
Calcium: 8.5 mg/dL — ABNORMAL LOW (ref 8.9–10.3)
Chloride: 86 mmol/L — ABNORMAL LOW (ref 98–111)
Creatinine, Ser: 2.85 mg/dL — ABNORMAL HIGH (ref 0.61–1.24)
GFR calc Af Amer: 24 mL/min — ABNORMAL LOW (ref >60)
GFR calc non Af Amer: 21 mL/min — ABNORMAL LOW (ref >60)
Glucose, Bld: 209 mg/dL — ABNORMAL HIGH (ref 70–99)
Potassium: 3 mmol/L — ABNORMAL LOW (ref 3.5–5.1)
Sodium: 133 mmol/L — ABNORMAL LOW (ref 135–145)
Total Bilirubin: 1 mg/dL (ref 0.3–1.2)
Total Protein: 6 g/dL — ABNORMAL LOW (ref 6.5–8.1)

## 2017-09-03 LAB — URINE CULTURE

## 2017-09-03 LAB — LACTIC ACID, PLASMA: Lactic Acid, Venous: 2.2 mmol/L (ref 0.5–1.9)

## 2017-09-03 LAB — MAGNESIUM: Magnesium: 1.7 mg/dL (ref 1.7–2.4)

## 2017-09-03 MED ORDER — POTASSIUM CHLORIDE CRYS ER 20 MEQ PO TBCR
40.0000 meq | EXTENDED_RELEASE_TABLET | ORAL | Status: AC
  Administered 2017-09-03 (×2): 40 meq via ORAL
  Filled 2017-09-03 (×2): qty 2

## 2017-09-03 MED ORDER — MAGNESIUM SULFATE IN D5W 1-5 GM/100ML-% IV SOLN
1.0000 g | Freq: Once | INTRAVENOUS | Status: AC
  Administered 2017-09-03: 1 g via INTRAVENOUS
  Filled 2017-09-03: qty 100

## 2017-09-03 MED ORDER — BENZONATATE 100 MG PO CAPS
100.0000 mg | ORAL_CAPSULE | Freq: Three times a day (TID) | ORAL | Status: DC | PRN
  Administered 2017-09-03: 100 mg via ORAL
  Filled 2017-09-03: qty 1

## 2017-09-03 NOTE — Care Management Obs Status (Signed)
MEDICARE OBSERVATION STATUS NOTIFICATION   Patient Details  Name: Chad Duke MRN: [id number] Date of Birth: 1946/02/03   Medicare Observation Status Notification Given:  Yes    Leone Haven, RN 09/03/2017, 3:27 PM

## 2017-09-03 NOTE — Progress Notes (Signed)
CSW consulted to discuss possible ALF placement with patient.  CSW met with pt and confirmed that he has been thinking about going to ALF since he has been having some falls at home.  Pt reports that he is able to do most things for himself and even continues to drive and work part time- he has a neighbor who he pays to help with cleaning and laundry when needed.  CSW discussed ALF and that cost would be between $2,500-3,500/month at most facilities- pt makes $2,100/month in SSI and is not interested in losing this money.  Pt would not qualify for Medicaid at ALF as he makes too much (normal ALF limit is about $1,200 to qualify for Medicaid)  Pt feels safe returning home and is hopeful that he will avoid falling now that he has had some medication changes- will plan to look into home aid to help with more housework to avoid doing potentially high risk activies  Pt reports no further needs at this time- CSW signing off   H. , LCSW Clinical Social Worker [phone]  

## 2017-09-03 NOTE — Evaluation (Signed)
Occupational Therapy Evaluation Patient Details Name: Chad Duke MRN: [id number] DOB: [birth year] Today's Date: 09/03/2017    History of Present Illness Pt is a 72 y.o. male admitted 09/02/17 with c/o dizziness, lightheadedness, urinary incontinence; of note, recent admission with similar symptoms 08/13/17. Pt with AKI; elevated troponin likely due to this. PMH includes CHF, HTN, DM2, COPD (2L home O2), gout, RA, DVT/PE on eliquis, AAA, CKDIII, OSA on BiPAP.   Clinical Impression   Pt reports walking with a cane and struggling with LB dressing. His neighbor helps with cleaning his home. Pt presents with decreased activity tolerance and impaired standing balance. Educated in use of dressing stick and extra wide sock aid. Pt appreciative. Pt is not able to afford ALF or ILF with services. Plans to go home with resumption of HHOT.   Follow Up Recommendations  Home health OT;Supervision - Intermittent    Equipment Recommendations  None recommended by OT    Recommendations for Other Services       Precautions / Restrictions Precautions Precautions: Fall Restrictions Weight Bearing Restrictions: No      Mobility Bed Mobility Overal bed mobility: Modified Independent Bed Mobility: Supine to Sit           General bed mobility comments: Increased time and effort  Transfers Overall transfer level: Modified independent Equipment used: (pushed IV pole) Transfers: Sit to/from Stand Sit to Stand: Supervision         General transfer comment: Standing with no UE support to use urinal, supervision for balance; mod indep with RW    Balance Overall balance assessment: Needs assistance   Sitting balance-Leahy Scale: Good       Standing balance-Leahy Scale: Fair Standing balance comment: Can move with no UE support and min guard; stability improved with UE support                           ADL either performed or assessed with clinical judgement   ADL Overall ADL's  : Needs assistance/impaired Eating/Feeding: Independent;Sitting   Grooming: Oral care;Brushing hair;Standing;Supervision/safety   Upper Body Bathing: Set up;Sitting   Lower Body Bathing: Minimal assistance;Sitting/lateral leans Lower Body Bathing Details (indicate cue type and reason): pt has a long handled bath sponge Upper Body Dressing : Set up;Sitting   Lower Body Dressing: Supervision/safety;Sit to/from stand;With adaptive equipment Lower Body Dressing Details (indicate cue type and reason): educated in use of dressing stick and extra wide sock aid Toilet Transfer: Supervision/safety;Ambulation;RW   Toileting- Clothing Manipulation and Hygiene: Supervision/safety;Sit to/from stand       Functional mobility during ADLs: Supervision/safety;Rolling walker General ADL Comments: pt fatigues easily     Vision Baseline Vision/History: Wears glasses Wears Glasses: At all times Patient Visual Report: No change from baseline       Perception     Praxis      Pertinent Vitals/Pain Pain Assessment: Faces Faces Pain Scale: Hurts a little bit Pain Location: back Pain Descriptors / Indicators: Sore Pain Intervention(s): Monitored during session     Hand Dominance Right   Extremity/Trunk Assessment Upper Extremity Assessment Upper Extremity Assessment: Generalized weakness(tremor)   Lower Extremity Assessment Lower Extremity Assessment: Defer to PT evaluation   Cervical / Trunk Assessment Cervical / Trunk Assessment: Other exceptions(weakness)   Communication Communication Communication: HOH   Cognition Arousal/Alertness: Awake/alert Behavior During Therapy: WFL for tasks assessed/performed Overall Cognitive Status: No family/caregiver present to determine baseline cognitive functioning Area of Impairment: Safety/judgement  Safety/Judgement: Decreased awareness of deficits     General Comments: Poor insight into  condition/deficits   General Comments       Exercises     Shoulder Instructions      Home Living Family/patient expects to be discharged to:: Private residence Living Arrangements: Alone Available Help at Discharge: Available PRN/intermittently;Neighbor Type of Home: Apartment Home Access: Level entry;Stairs to enter Entrance Stairs-Number of Steps: one step into building   Home Layout: One level     Bathroom Shower/Tub: Chief Strategy Officer: Standard     Home Equipment: Toilet riser;Cane - single point;Walker - 4 wheels;Shower seat;Adaptive equipment Adaptive Equipment: Reacher;Sock aid;Long-handled shoe horn Additional Comments: already getting HHPT/OT/RN/aide      Prior Functioning/Environment Level of Independence: Needs assistance  Gait / Transfers Assistance Needed: overall independent, occasional use of SPC when he feels "wobbly". Endorses falls due to "lasix making me dizzy" ADL's / Homemaking Assistance Needed: assist from aide for bathing 2x/week, assist for donning compression socks   Comments: completes grcoery shopping (uses electric cart), driving, works        OT Problem List: Decreased strength;Impaired balance (sitting and/or standing);Decreased activity tolerance      OT Treatment/Interventions: Self-care/ADL training;DME and/or AE instruction;Therapeutic activities;Balance training;Therapeutic exercise;Patient/family education;Energy conservation    OT Goals(Current goals can be found in the care plan section) Acute Rehab OT Goals Patient Stated Goal: Return home; interested in ALF if he can keep working  OT Frequency:     Barriers to D/C:            Co-evaluation              AM-PAC PT "6 Clicks" Daily Activity     Outcome Measure Help from another person eating meals?: None Help from another person taking care of personal grooming?: A Little Help from another person toileting, which includes using toliet, bedpan, or  urinal?: A Little Help from another person bathing (including washing, rinsing, drying)?: A Little Help from another person to put on and taking off regular upper body clothing?: None Help from another person to put on and taking off regular lower body clothing?: A Little 6 Click Score: 20   End of Session Equipment Utilized During Treatment: Gait belt  Activity Tolerance: Patient tolerated treatment well Patient left: in chair;with call bell/phone within reach;with nursing/sitter in room  OT Visit Diagnosis: Muscle weakness (generalized) (M62.81)                Time: 1115-1150 OT Time Calculation (min): 35 min Charges:  OT General Charges $OT Visit: 1 Visit OT Evaluation $OT Eval Moderate Complexity: 1 Mod OT Treatments $Self Care/Home Management : 8-22 mins  09/03/2017 Martie Round, OTR/L Pager: [phone]  Iran Planas [place] Bailiff 09/03/2017, 11:57 AM

## 2017-09-03 NOTE — Evaluation (Signed)
Physical Therapy Evaluation Patient Details Name: Chad Duke MRN: [id number] DOB: Sep 10, 1945 Today's Date: 09/03/2017   History of Present Illness  Pt is a 72 y.o. male admitted 09/02/17 with c/o dizziness, lightheadedness, urinary incontinence; of note, recent admission with similar symptoms 08/13/17. Pt with AKI; elevated troponin likely due to this. PMH includes CHF, HTN, DM2, COPD (2L home O2), gout, RA, DVT/PE on eliquis, AAA, CKDIII, OSA on BiPAP.    Clinical Impression  Pt presents with an overall decrease in functional mobility secondary to above. PTA, pt indep with household ambulation using SPC/rollator; requires assist from aide for bathing and lower body dressing. Endorses unsteadiness at home "feeling dizzy from the Lasix." Today, pt transfers and ambulates at supervision-level; stability improved with use of RW; DOE 2/4, SpO2 96% on RA throughout.  . Increased time spent discussing fall risk reduction, energy conservation, and importance of continued mobility. Pt interested in ALF if he can continue to work and drive (SW to follow up regarding ALF options). Pt would benefit from continued acute PT services to maximize functional mobility and independence prior to d/c.     Follow Up Recommendations Home health PT;Supervision - Intermittent(potential ALF)    Equipment Recommendations  None recommended by PT    Recommendations for Other Services       Precautions / Restrictions Precautions Precautions: Fall Restrictions Weight Bearing Restrictions: No      Mobility  Bed Mobility Overal bed mobility: Modified Independent Bed Mobility: Supine to Sit           General bed mobility comments: Increased time and effort  Transfers Overall transfer level: Needs assistance Equipment used: None Transfers: Sit to/from Stand Sit to Stand: Supervision         General transfer comment: Standing with no UE support to use urinal, supervision for balance; mod indep with  RW  Ambulation/Gait Ambulation/Gait assistance: Supervision Gait Distance (Feet): 250 Feet Assistive device: Rolling walker (2 wheeled) Gait Pattern/deviations: Step-through pattern;Decreased stride length;Wide base of support Gait velocity: Decreased Gait velocity interpretation: 1.31 - 2.62 ft/sec, indicative of limited community ambulator General Gait Details: Slow, steady amb with RW and supervision for balance. 3x quick standing rest breaks due to fatigue/DOE. SpO2 96% on RA throughout  Stairs            Wheelchair Mobility    Modified Rankin (Stroke Patients Only)       Balance Overall balance assessment: Needs assistance   Sitting balance-Leahy Scale: Good       Standing balance-Leahy Scale: Fair Standing balance comment: Can move with no UE support and min guard; stability improved with UE support                             Pertinent Vitals/Pain Pain Assessment: No/denies pain    Home Living Family/patient expects to be discharged to:: Private residence Living Arrangements: Alone Available Help at Discharge: Friend(s);Available PRN/intermittently Type of Home: Apartment Home Access: Level entry;Stairs to enter   Entrance Stairs-Number of Steps: one step into building Home Layout: One level Home Equipment: Toilet riser;Cane - single point;Walker - 4 wheels;Shower seat Additional Comments: already getting HHPT/OT/RN/aide    Prior Function Level of Independence: Needs assistance   Gait / Transfers Assistance Needed: overall independent, occasional use of SPC when he feels "wobbly". Endorses falls due to "lasix making me dizzy"  ADL's / Homemaking Assistance Needed: assist from aide for bathing 2x/week, assist for donning compression socks  Comments: completes grcoery shopping (uses [employer], [employer]), driving, works     Higher education careers adviser        Extremity/Trunk Assessment   Upper Extremity Assessment Upper Extremity Assessment: Generalized  weakness    Lower Extremity Assessment Lower Extremity Assessment: Generalized weakness       Communication   Communication: HOH  Cognition Arousal/Alertness: Awake/alert Behavior During Therapy: WFL for tasks assessed/performed Overall Cognitive Status: No family/caregiver present to determine baseline cognitive functioning Area of Impairment: Safety/judgement                         Safety/Judgement: Decreased awareness of deficits     General Comments: Poor insight into condition/deficits      General Comments      Exercises     Assessment/Plan    PT Assessment Patient needs continued PT services  PT Problem List Decreased strength;Decreased activity tolerance;Decreased balance;Decreased mobility;Cardiopulmonary status limiting activity       PT Treatment Interventions DME instruction;Gait training;Stair training;Functional mobility training;Therapeutic activities;Therapeutic exercise;Balance training;Patient/family education    PT Goals (Current goals can be found in the Care Plan section)  Acute Rehab PT Goals Patient Stated Goal: Return home; interested in ALF if he can keep working PT Goal Formulation: With patient Time For Goal Achievement: 09/17/17 Potential to Achieve Goals: Good    Frequency Min 3X/week   Barriers to discharge Decreased caregiver support      Co-evaluation               AM-PAC PT "6 Clicks" Daily Activity  Outcome Measure Difficulty turning over in bed (including adjusting bedclothes, sheets and blankets)?: None Difficulty moving from lying on back to sitting on the side of the bed? : None Difficulty sitting down on and standing up from a chair with arms (e.g., wheelchair, bedside commode, etc,.)?: A Little Help needed moving to and from a bed to chair (including a wheelchair)?: A Little Help needed walking in hospital room?: A Little Help needed climbing 3-5 steps with a railing? : A Little 6 Click Score: 20     End of Session Equipment Utilized During Treatment: Gait belt Activity Tolerance: Patient tolerated treatment well Patient left: in chair;with call bell/phone within reach Nurse Communication: Mobility status PT Visit Diagnosis: Other abnormalities of gait and mobility (R26.89)    Time: 5035-4656 PT Time Calculation (min) (ACUTE ONLY): 40 min   Charges:   PT Evaluation $PT Eval Moderate Complexity: 1 Mod PT Treatments $Gait Training: 8-22 mins $Self Care/Home Management: 8-22      Ina Homes, PT, DPT Acute Rehab Services  Pager: [phone]  Malachy Chamber 09/03/2017, 10:03 AM

## 2017-09-03 NOTE — Discharge Instructions (Signed)
.  [facility]

## 2017-09-03 NOTE — Consult Note (Signed)
Consultation Note Date: 09/03/2017   Patient Name: Chad Duke  DOB: 1945/04/01  MRN: [id number]  Age / Sex: 72 y.o., male  PCP: [name], MD Referring Physician: Albertine Grates, MD  Reason for Consultation: Establishing goals of care and Psychosocial/spiritual support  HPI/Patient Profile: 72 y.o. male  with past medical history of AAA (2.8 cm), three-vessel artery calcification, diastolic heart failure with an EF of 65 to 70%, diabetes type 2, hypertension, hyperlipidemia, OSA on BiPAP, chronic kidney disease stage III admitted on 09/02/2017 with weakness and feeling dizzy.  Patient was recently admitted on April 13, 2017 with similar complaints.  He was seen by cardiology secondary to increasing edema, increased troponin.  They have since signed off  Consult ordered for "recurrent admissions".   Clinical Assessment and Goals of Care: Patient seen, chart reviewed.  Patient has been seen by case management and  pursuing  Home First program in to see patient to see if this can provide him increased assistance at home.  Patient has become quite weak, functional decline and lives alone.  He does have children but they live out of state.  He has 1 son who lives in [place] with whom he is in contact; one son who lives in [place] with whom he is not spoken to in 6 years.  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  He is currently being followed by [facility] that are coming 4 times a week and staying for about an hour at a time.  Patient is verbalizing that he feels that he needs more help with light cleaning duty, meal preparation  Patient is currently able to make his own healthcare decisions.  Confirmed with patient that DNR/DNI is his wish.  He very candidly states that when he no longer can take care of himself that would be a point that he would  not view him having quality of life.  He asks me about physician assisted suicide as well as palliative sedation.  I did ask him if he felt like he was dying and he stated no.  We talked generally about policies not being in place in [place] for physician assisted suicide; conditions under which clinically some people require palliative sedation and generally decisions that people often face as they near end of life in terms of specifying their clinical wishes.    SUMMARY OF RECOMMENDATIONS   DNR/DNI Home with home health with [facility] Program thru [facility] Would not meet hospice criteria at this point in my opinion He could benefit from further goals of care conversation as his clinical condition declines.  Those services can be accessed through either, [facility] at [phone]; [facility], which can be accessed at [phone];  Code Status/Advance Care Planning:     Palliative Prophylaxis:   Aspiration, Bowel Regimen, Eye Care, Frequent Pain Assessment, Oral Care and Turn Reposition   Psycho-social/Spiritual:   Desire  for further Chaplaincy support:no  Additional Recommendations: Referral to [facility]   Prognosis:   Unable to determine  Discharge Planning: Home with Home Health      Primary Diagnoses: Present on Admission: . COPD GOLD III  . Sleep apnea . Rheumatoid arthritis (HCC) . Morbid obesity due to excess calories (HCC) . Chronic diastolic CHF (congestive heart failure) (HCC) . Bilateral leg edema . Acute renal failure superimposed on stage 3 chronic kidney disease (HCC) . Orthostatic hypotension . Hypokalemia . Hypomagnesemia . Controlled type 2 diabetes mellitus with hyperglycemia (HCC) . Urinary incontinence   I have reviewed the medical record, interviewed the patient and family, and examined the patient. The following aspects are pertinent.  Past Medical  History:  Diagnosis Date  . AAA (abdominal aortic aneurysm) (HCC)   . Allergy    Rhinitis  . Arthritis    Rhuematoid  . Asthma   . Chronic renal insufficiency   . Diverticulosis   . Emphysema   . GERD (gastroesophageal reflux disease)   . Hearing loss of both ears   . Hiatal hernia   . Hyperlipidemia   . Hypertension   . Male hypogonadism   . Nephrolithiasis   . Nephrolithiasis   . Sleep apnea    On CPAP  . Stasis dermatitis   . Varicose veins   . Vitamin B 12 deficiency    Social History   Socioeconomic History  . Marital status: Divorced    Spouse name: Not on file  . Number of children: 2  . Years of education: Not on file  . Highest education level: Not on file  Occupational History  . Not on file  Social Needs  . Financial resource strain: Not on file  . Food insecurity:    Worry: Not on file    Inability: Not on file  . Transportation needs:    Medical: Not on file    Non-medical: Not on file  Tobacco Use  . Smoking status: Current Every Day Smoker    Packs/day: 0.10    Years: 53.00    Pack years: 5.30    Types: Cigarettes  . Smokeless tobacco: Never Used  . Tobacco comment: 1-2 cigarettes a day   Substance and Sexual Activity  . Alcohol use: Not Currently    Alcohol/week: 0.0 oz  . Drug use: No  . Sexual activity: Not on file  Lifestyle  . Physical activity:    Days per week: Not on file    Minutes per session: Not on file  . Stress: Not on file  Relationships  . Social connections:    Talks on phone: Not on file    Gets together: Not on file    Attends religious service: Not on file    Active member of club or organization: Not on file    Attends meetings of clubs or organizations: Not on file    Relationship status: Not on file  Other Topics Concern  . Not on file  Social History Narrative  . Not on file   Family History  Problem Relation Age of Onset  . Emphysema Father   . Asthma Father   . Osteoarthritis Father   . Heart  attack Mother   . Uterine cancer Mother   . Heart disease Mother   . ADD / ADHD Son   . Depression Son   . Hypertension Brother   . Heart disease Brother        Pacemaker   . Heart  murmur Brother    Scheduled Meds: . apixaban  5 mg Oral BID  . aspirin EC  81 mg Oral Daily  . atorvastatin  40 mg Oral Daily  . calcium-vitamin D  1 tablet Oral Q breakfast  . febuxostat  40 mg Oral Daily  . folic acid  1 mg Oral Daily  . guaiFENesin  1,200 mg Oral BID  . insulin aspart  0-20 Units Subcutaneous TID WC  . insulin aspart  0-5 Units Subcutaneous QHS  . ipratropium-albuterol  3 mL Nebulization Q6H  . [START ON 09/06/2017] methotrexate  10 mg Oral Weekly  . mometasone-formoterol  2 puff Inhalation BID  . multivitamin with minerals  1 tablet Oral Daily  . pantoprazole  40 mg Oral BID  . potassium chloride  40 mEq Oral Q4H  . predniSONE  10 mg Oral BID  . sodium chloride flush  3 mL Intravenous Q12H  . tiotropium  18 mcg Inhalation Daily  . cyanocobalamin  2,500 mcg Oral Daily  . vitamin C  1,000 mg Oral Daily   Continuous Infusions: PRN Meds:.acetaminophen **OR** acetaminophen, albuterol, ondansetron **OR** ondansetron (ZOFRAN) IV, traMADol, traZODone Medications Prior to Admission:  Prior to Admission medications   Medication Sig Start Date End Date Taking? Authorizing Provider  albuterol (PROVENTIL HFA;VENTOLIN HFA) 108 (90 Base) MCG/ACT inhaler Inhale 2 puffs into the lungs every 4 (four) hours as needed for wheezing or shortness of breath. 07/30/17  Yes Nyoka Cowden, MD  apixaban (ELIQUIS) 5 MG TABS tablet Take two-5mg  tablets (total 10mg ) twice daily for 7 days.  On day #8, starting taking one-5mg  tablet twice daily Patient taking differently: Take 5 mg by mouth 2 (two) times daily.  07/23/17  Yes Randel Pigg, Dorma Russell, MD  Ascorbic Acid (VITAMIN C) 1000 MG tablet Take 1,000 mg by mouth daily.   Yes [provider]  aspirin EC 81 MG tablet Take 81 mg by mouth daily.   Yes  [provider]  atorvastatin (LIPITOR) 40 MG tablet Take 1 tablet (40 mg total) by mouth daily. 05/18/17 09/02/17 Yes Lewayne Bunting, MD  Calcium-Magnesium-Vitamin D (CITRACAL CALCIUM+D) 600-40-500 MG-MG-UNIT TB24 Take 1 tablet by mouth daily.     Yes [provider]  cetirizine (ZYRTEC) 10 MG tablet Take 10 mg by mouth daily.     Yes [provider]  cyanocobalamin 2000 MCG tablet Take 2,500 mcg by mouth daily.    Yes [provider]  febuxostat (ULORIC) 40 MG tablet Take 40 mg by mouth daily.   Yes [provider]  folic acid (FOLVITE) 400 MCG tablet Take 1,200 mcg by mouth daily.   Yes [provider]  furosemide (LASIX) 40 MG tablet Take 40 mg by mouth 2 (two) times daily. 08/24/17  Yes [provider]  guaiFENesin (MUCINEX) 600 MG 12 hr tablet Take 1,200 mg by mouth 2 (two) times daily.   Yes [provider]  InFLIXimab (REMICADE IV) Inject into the vein every 6 (six) weeks.   Yes [provider]  ipratropium-albuterol (DUONEB) 0.5-2.5 (3) MG/3ML SOLN Take 3 mLs by nebulization every 6 (six) hours.    Yes [provider]  metFORMIN (GLUCOPHAGE) 500 MG tablet Take 1 tablet (500 mg total) by mouth 2 (two) times daily with a meal. 07/23/17 09/02/17 Yes Randel Pigg, Dorma Russell, MD  methotrexate (RHEUMATREX) 2.5 MG tablet Take 10 mg by mouth once a week. On Mondays 02/11/11  Yes [provider]  mometasone-formoterol (DULERA) 200-5 MCG/ACT AERO Inhale  2 puffs into the lungs 2 (two) times daily. 07/23/17  Yes Lenox Ponds, MD  Multiple Vitamin (MULTIVITAMIN) tablet Take 1 tablet by mouth daily.     Yes [provider]  OXYGEN BIPAP with 1lpm o2   Yes [provider]  pantoprazole (PROTONIX) 40 MG tablet Take 40 mg by mouth 2 (two) times daily.    Yes [provider]  potassium chloride (MICRO-K) 10 MEQ CR capsule Take 10 mEq by mouth 2 (two) times daily. 08/24/17  Yes [provider]  predniSONE (DELTASONE) 10 MG tablet Take 5 tablets for 4 days; Take 4 tablets for 3 days; then take 3 tablets daily. Patient taking differently: Take 10 mg by mouth 2 (two) times daily.  07/23/17  Yes Randel Pigg, Dorma Russell, MD  Tiotropium Bromide Monohydrate (SPIRIVA RESPIMAT) 2.5 MCG/ACT AERS Inhale 2 puffs into the lungs daily. 05/31/17  Yes Nyoka Cowden, MD  Respiratory Therapy Supplies (FLUTTER) DEVI Use as directed 11/04/16   Nyoka Cowden, MD   Allergies  Allergen Reactions  . Chantix [Varenicline] Other (See Comments)    Suicidal thoughts   Review of Systems  Constitutional: Positive for activity change, fatigue and unexpected weight change.  HENT: Negative.   Eyes: Negative.   Respiratory: Positive for chest tightness and shortness of breath.   Cardiovascular: Positive for leg swelling.  Gastrointestinal: Negative.   Endocrine: Negative.   Genitourinary: Negative.   Musculoskeletal: Negative.   Skin: Negative.   Allergic/Immunologic: Negative.   Neurological: Positive for weakness.  Hematological: Negative.   Psychiatric/Behavioral: Positive for dysphoric mood.    Physical Exam  Constitutional: He is oriented to person, place, and time. He appears well-developed and well-nourished.  HENT:  Head: Normocephalic and atraumatic.  Neck: Normal range of motion.  Cardiovascular: Normal rate.  Pulmonary/Chest:  No increased work of breathing at rest  Abdominal: Soft.  Musculoskeletal: Normal range of motion.  Neurological: He is alert and oriented to person, place, and time.  Skin: Skin is warm and dry.  Psychiatric:  Irritable  Nursing note and vitals reviewed.   Vital Signs: BP 108/70 (BP Location: Right Arm)   Pulse 94   Temp 98.4 F (36.9 C)   Resp 19   Ht 5\' 7"  (1.702 m)   Wt 100.6 kg (221 lb 12.8 oz)   SpO2 96%   BMI 34.74 kg/m  Pain Scale: 0-10   Pain Score: 0-No pain   SpO2: SpO2: 96 % O2 Device:SpO2: 96 % O2 Flow Rate: .O2 Flow  Rate (L/min): 2 L/min  IO: Intake/output summary:   Intake/Output Summary (Last 24 hours) at 09/03/2017 1200 Last data filed at 09/03/2017 4540 Gross per 24 hour  Intake 1753 ml  Output 700 ml  Net 1053 ml    LBM: Last BM Date: 09/01/17 Baseline Weight: Weight: 125.2 kg (276 lb) Most recent weight: Weight: 100.6 kg (221 lb 12.8 oz)     Palliative Assessment/Data:   Flowsheet Rows     Most Recent Value  Intake Tab  Referral Department  Hospitalist  Unit at Time of Referral  Med/Surg Unit  Palliative Care Primary Diagnosis  Cardiac  Date Notified  09/02/17  Reason for referral  Clarify Goals of Care, Psychosocial or Spiritual support  Date of Admission  09/02/17  Date first seen by Palliative Care  09/03/17  # of days Palliative referral response time  1 Day(s)  # of days IP prior to Palliative referral  0  Clinical Assessment  Palliative  Performance Scale Score  50%  Pain Max last 24 hours  Not able to report  Pain Min Last 24 hours  Not able to report  Dyspnea Max Last 24 Hours  Not able to report  Dyspnea Min Last 24 hours  Not able to report  Nausea Max Last 24 Hours  Not able to report  Nausea Min Last 24 Hours  Not able to report  Anxiety Max Last 24 Hours  Not able to report  Anxiety Min Last 24 Hours  Not able to report  Other Max Last 24 Hours  Not able to report  Psychosocial & Spiritual Assessment  Palliative Care Outcomes  Patient/Family meeting held?  Yes  Who was at the meeting?  pt  Patient/Family wishes: Interventions discontinued/not started   Mechanical Ventilation      Time In: 1000 Time Out: 1100 Time Total: 60 min Greater than 50%  of this time was spent counseling and coordinating care related to the above assessment and plan.  Signed by: Irean Hong, NP   Please contact Palliative Medicine Team phone at [phone] for questions and concerns.  For individual provider: See Loretha Stapler

## 2017-09-03 NOTE — Progress Notes (Signed)
PROGRESS NOTE  Chad Duke ENI:[id number] DOB: 05-Sep-1945 DOA: 09/02/2017 PCP: [name], MD  HPI/Recap of past 24 hours:  Sitting up in chair, talking on the phone Denies pain  Assessment/Plan: Principal Problem:   Acute renal failure superimposed on stage 3 chronic kidney disease (HCC) Active Problems:   COPD GOLD III    Sleep apnea   Rheumatoid arthritis (HCC)   Morbid obesity due to excess calories (HCC)   Chronic diastolic CHF (congestive heart failure) (HCC)   Bilateral leg edema   Orthostatic hypotension   Hypokalemia   Hypomagnesemia   Controlled type 2 diabetes mellitus with hyperglycemia (HCC)   Urinary incontinence   AKI on CKDIII Urine culture with multiple species, no fever, he reports urinary incontinence , will follow bladder scan aki from Cardi[facility] vs overdiuresis He dose has bilateral lower extremity pitting edema, cxr no pulmonary edema He has significant orthostatic hypotension on presentation Cardiology consulted who think symptom due to overdiresis and recommended holding diuretics Diuretic held since admission, cr appear improving Patient reports feeling better  Hypokalemia/hypomagnesemia Replace k/mag, recheck in am  lft elevation He denies abdominal pain, no n/v Will get liver US I have discuss with patient about holding methotraxate  Chronic diastolic chf presented with orthostatic hypotension Hard to access volume status due to reported chronic bilateral lower extremity edema cxr no pulmonary edema, no pleural effusion Continue hold diuretics for now  Chronic bilateral lower extremity edema H/o DVT and PE on eliquis Compression stocking, elevate legs  noninsulin dependent dm2 a1c 8 Home meds metformin held  Lactic acidosis He does not appear septic, no sign of infection From metformin? From tissue hypoperfusion due to orthostatic hypotension/dehydration? Hold metformin and diuretics improving  COPD, home o2 dependent  He  reports has been on daily steroids since last discharge  RA/immunosupressed status infliximab q6wks, On methotraxate and daily prednisone Hold methotraxate due to elevated lft  Macrocytic anemia  Has been on methotraxate hgb stable around 11.  Body mass index is 34.74 kg/m. OSA on bipap at home  FTT/falls Reports falls x4 last 22months, frequent ED visits on 6/23, 7/4 , 7/9 then 7/25  PT/OTeval  Code Status: DNR  Family Communication: patient   Disposition Plan: home with home health at discharge, consider alf arrangement    Consultants:  Palliative care  Procedures:  none  Antibiotics:  none   Objective: BP (!) 119/100 (BP Location: Left Arm)   Pulse (!) 114   Temp (!) 97.5 F (36.4 C)   Resp 18   Ht 5\' 7"  (1.702 m)   Wt 100.6 kg (221 lb 12.8 oz)   SpO2 99%   BMI 34.74 kg/m   Intake/Output Summary (Last 24 hours) at 09/03/2017 0735 Last data filed at 09/03/2017 0600 Gross per 24 hour  Intake 1030 ml  Output 700 ml  Net 330 ml   Filed Weights   09/02/17 0843 09/03/17 0500  Weight: 125.2 kg (276 lb) 100.6 kg (221 lb 12.8 oz)    Exam: Patient is examined daily including today on 09/03/2017, exams remain the same as of yesterday except that has changed    General:  NAD  Cardiovascular: RRR  Respiratory: CTABL  Abdomen: Soft/ND/NT, positive BS  Musculoskeletal: bilateral lower extremity pitting edema  Neuro: alert, oriented   Data Reviewed: Basic Metabolic Panel: Recent Labs  Lab 09/02/17 0907 09/03/17 0304  NA 135 133*  K 2.7* 3.0*  CL 83* 86*  CO2 32 32  GLUCOSE 178* 209*  BUN  69* 66*  CREATININE 2.91* 2.85*  CALCIUM 8.7* 8.5*  MG 1.2* 1.7   Liver Function Tests: Recent Labs  Lab 09/02/17 0907 09/03/17 0304  AST 77* 67*  ALT 121* 113*  ALKPHOS 71 66  BILITOT 1.2 1.0  PROT 6.4* 6.0*  ALBUMIN 3.1* 2.9*   No results for input(s): LIPASE, AMYLASE in the last 168 hours. No results for input(s): AMMONIA in the last 168  hours. CBC: Recent Labs  Lab 09/02/17 0907 09/03/17 0304  WBC 7.6 7.9  NEUTROABS 6.0  --   HGB 11.9* 10.6*  HCT 35.1* 32.1*  MCV 102.3* 102.2*  PLT 203 198   Cardiac Enzymes:   Recent Labs  Lab 09/02/17 0907  TROPONINI 0.06*   BNP (last 3 results) Recent Labs    07/19/17 1842 08/12/17 2238 09/02/17 0907  BNP 45.4 55.9 60.2    ProBNP (last 3 results) No results for input(s): PROBNP in the last 8760 hours.  CBG: Recent Labs  Lab 09/02/17 1645 09/02/17 2107  GLUCAP 208* 215*    No results found for this or any previous visit (from the past 240 hour(s)).   Studies: Dg Chest Port 1 View  Result Date: 09/02/2017 CLINICAL DATA:  Shortness of breath EXAM: PORTABLE CHEST 1 VIEW COMPARISON:  08/17/2017 FINDINGS: Cardiac shadow is stable. Lungs are well aerated bilaterally. Previously seen nodular density over the left lung base is no longer identified. Some mild atelectatic changes are noted bilaterally. No acute bony abnormality is seen. Postsurgical changes in left shoulder are noted. IMPRESSION: Mild bibasilar atelectasis. Electronically Signed   By: Alcide Clever M.D.   On: 09/02/2017 09:40    Scheduled Meds: . apixaban  5 mg Oral BID  . aspirin EC  81 mg Oral Daily  . atorvastatin  40 mg Oral Daily  . calcium-vitamin D  1 tablet Oral Q breakfast  . febuxostat  40 mg Oral Daily  . folic acid  1 mg Oral Daily  . guaiFENesin  1,200 mg Oral BID  . insulin aspart  0-20 Units Subcutaneous TID WC  . insulin aspart  0-5 Units Subcutaneous QHS  . ipratropium-albuterol  3 mL Nebulization Q6H  . [START ON 09/06/2017] methotrexate  10 mg Oral Weekly  . mometasone-formoterol  2 puff Inhalation BID  . multivitamin with minerals  1 tablet Oral Daily  . pantoprazole  40 mg Oral BID  . potassium chloride  40 mEq Oral Q4H  . predniSONE  10 mg Oral BID  . sodium chloride flush  3 mL Intravenous Q12H  . tiotropium  18 mcg Inhalation Daily  . cyanocobalamin  2,500 mcg Oral Daily   . vitamin C  1,000 mg Oral Daily    Continuous Infusions: . magnesium sulfate 1 - 4 g bolus IVPB       Time spent: I have personally reviewed and interpreted on  09/03/2017 daily labs, tele strips, imagings as discussed above under date review session and assessment and plans.  I reviewed all nursing notes, pharmacy notes, consultant notes,  vitals, pertinent old records  I have discussed plan of care as described above with RN , patient  on 09/03/2017   Albertine Grates MD, PhD  [facility] Pager [phone]. If 7PM-7AM, please contact night-coverage at [email or web address], password [facility] 09/03/2017, 7:35 AM  LOS: 0 days

## 2017-09-03 NOTE — Care Management Note (Addendum)
Case Management Note  Patient Details  Name: Terrelle Ruffolo MRN: [id number] Date of Birth: 1945/10/24  Subjective/Objective:  From home alone, active with [facility] for RN, PT, aide , will add OT.  Confirmed with Tiffany at [facility], .  Will check to see if eligible for [facility], referral made to [facility], he states his rep from the office will come to talk to patient about the program.  Per Kandee Keen with [facility] patient wants to work with the [facility] Program .  NCM notified Tiffany with [facility] to dc services.  Will need HH orders in .                Action/Plan: NCM will follow for transitions of care.  Expected Discharge Date:                  Expected Discharge Plan:  Home w Home Health Services  In-House Referral:  Clinical Social Work  Discharge planning Services  CM Consult  Post Acute Care Choice:  Resumption of Svcs/PTA Provider Choice offered to:     DME Arranged:    DME Agency:     HH Arranged:  RN, PT, OT, Nurse's Aide HH Agency:  [facility]  Status of Service:  Completed, signed off  If discussed at [employer], dates discussed:    Additional Comments:  Leone Haven, RN 09/03/2017, 10:38 AM

## 2017-09-03 NOTE — Progress Notes (Signed)
Nutrition Consult/Brief Note  RD consulted for diet education for new onset CHF.  Pt also identified on the Malnutrition Screening Tool (MST) Report.  Wt Readings from Last 15 Encounters:  09/03/17 221 lb 12.8 oz (100.6 kg)  08/17/17 276 lb (125.2 kg)  08/13/17 236 lb 1.6 oz (107.1 kg)  07/23/17 259 lb 4.8 oz (117.6 kg)  07/15/17 259 lb (117.5 kg)  06/29/17 267 lb (121.1 kg)  05/31/17 256 lb (116.1 kg)  05/18/17 259 lb 9.6 oz (117.8 kg)  03/29/17 262 lb (118.8 kg)  03/05/17 262 lb 12.8 oz (119.2 kg)  03/04/17 267 lb (121.1 kg)  11/04/16 264 lb (119.7 kg)  09/22/16 262 lb (118.8 kg)  09/08/16 259 lb (117.5 kg)  07/14/16 257 lb (116.6 kg)   RD provided "Heart Failure Nutrition Therapy" handout from the Academy of Nutrition and Dietetics.   Body mass index is 34.74 kg/m. Patient meets criteria for Obesity Class I based on current BMI.   Per weight readings above, pt's weight highly fluctuates more than likely due to fluid.  Current diet order is Heart Healthy/Carbohydrate Modified, patient is consuming approximately 100% of meals at this time. Labs and medications reviewed.   No further nutrition interventions warranted at this time. If nutrition issues arise, please re-consult RD.   Maureen Chatters, RD, LDN Pager #: [phone] After-Hours Pager #: [phone]

## 2017-09-03 NOTE — Care Management Note (Addendum)
Case Management Note  Patient Details  Name: Kipton Skillen MRN: [id number] Date of Birth: Aug 10, 1945  Subjective/Objective:    From home alone, active with [facility] for RN, PT, aide , will add OT.  Confirmed with Tiffany at [facility], .  Will check to see if eligible for [facility] First, referral made to [facility], he states his rep from the office will come to talk to patient about the program.  Per Kandee Keen with [facility] patient wants to work with the [facility] .  NCM notified Tiffany with [facility] to dc services. MD notified to put orders in , he will be dc tomorrow.                                Action/Plan: DC home with [facility] Program when ready, will Need HHRN, HHPT, HHOT orders.   Expected Discharge Date:                  Expected Discharge Plan:  Home w Home Health Services  In-House Referral:  Clinical Social Work  Discharge planning Services  CM Consult  Post Acute Care Choice:  Home Health Choice offered to:  Patient  DME Arranged:    DME Agency:     HH Arranged:  RN, PT, OT HH Agency:  [facility]  Status of Service:  Completed, signed off  If discussed at Long Length of Stay Meetings, dates discussed:    Additional Comments:  Leone Haven, RN 09/03/2017, 1:14 PM

## 2017-09-03 NOTE — Progress Notes (Signed)
EKG CRITICAL VALUE     12 lead EKG performed.  Critical value noted.  Marcie Bal, RN notified.   Rachel Bo, CCT 09/03/2017 8:06 AM

## 2017-09-04 ENCOUNTER — Observation Stay (HOSPITAL_COMMUNITY): Payer: Medicare Other

## 2017-09-04 DIAGNOSIS — E1122 Type 2 diabetes mellitus with diabetic chronic kidney disease: Secondary | ICD-10-CM | POA: Diagnosis not present

## 2017-09-04 DIAGNOSIS — R600 Localized edema: Secondary | ICD-10-CM | POA: Diagnosis not present

## 2017-09-04 DIAGNOSIS — E119 Type 2 diabetes mellitus without complications: Secondary | ICD-10-CM | POA: Diagnosis not present

## 2017-09-04 DIAGNOSIS — I5032 Chronic diastolic (congestive) heart failure: Secondary | ICD-10-CM | POA: Diagnosis not present

## 2017-09-04 DIAGNOSIS — J449 Chronic obstructive pulmonary disease, unspecified: Secondary | ICD-10-CM | POA: Diagnosis not present

## 2017-09-04 DIAGNOSIS — N183 Chronic kidney disease, stage 3 (moderate): Secondary | ICD-10-CM | POA: Diagnosis not present

## 2017-09-04 DIAGNOSIS — G4730 Sleep apnea, unspecified: Secondary | ICD-10-CM

## 2017-09-04 DIAGNOSIS — R945 Abnormal results of liver function studies: Secondary | ICD-10-CM | POA: Diagnosis not present

## 2017-09-04 DIAGNOSIS — K760 Fatty (change of) liver, not elsewhere classified: Secondary | ICD-10-CM | POA: Diagnosis not present

## 2017-09-04 DIAGNOSIS — E876 Hypokalemia: Secondary | ICD-10-CM | POA: Diagnosis not present

## 2017-09-04 DIAGNOSIS — I951 Orthostatic hypotension: Secondary | ICD-10-CM | POA: Diagnosis not present

## 2017-09-04 DIAGNOSIS — N179 Acute kidney failure, unspecified: Secondary | ICD-10-CM | POA: Diagnosis not present

## 2017-09-04 LAB — MAGNESIUM: Magnesium: 1.9 mg/dL (ref 1.7–2.4)

## 2017-09-04 LAB — HEPATIC FUNCTION PANEL
ALT: 100 U/L — ABNORMAL HIGH (ref 0–44)
AST: 53 U/L — ABNORMAL HIGH (ref 15–41)
Albumin: 2.8 g/dL — ABNORMAL LOW (ref 3.5–5.0)
Alkaline Phosphatase: 62 U/L (ref 38–126)
Bilirubin, Direct: 0.2 mg/dL (ref 0.0–0.2)
Indirect Bilirubin: 0.6 mg/dL (ref 0.3–0.9)
Total Bilirubin: 0.8 mg/dL (ref 0.3–1.2)
Total Protein: 5.8 g/dL — ABNORMAL LOW (ref 6.5–8.1)

## 2017-09-04 LAB — BASIC METABOLIC PANEL
Anion gap: 15 (ref 5–15)
BUN: 61 mg/dL — ABNORMAL HIGH (ref 8–23)
CO2: 30 mmol/L (ref 22–32)
Calcium: 8.8 mg/dL — ABNORMAL LOW (ref 8.9–10.3)
Chloride: 90 mmol/L — ABNORMAL LOW (ref 98–111)
Creatinine, Ser: 2.29 mg/dL — ABNORMAL HIGH (ref 0.61–1.24)
GFR calc Af Amer: 31 mL/min — ABNORMAL LOW (ref >60)
GFR calc non Af Amer: 27 mL/min — ABNORMAL LOW (ref >60)
Glucose, Bld: 193 mg/dL — ABNORMAL HIGH (ref 70–99)
Potassium: 3.5 mmol/L (ref 3.5–5.1)
Sodium: 135 mmol/L (ref 135–145)

## 2017-09-04 LAB — GLUCOSE, CAPILLARY
Glucose-Capillary: 203 mg/dL — ABNORMAL HIGH (ref 70–99)
Glucose-Capillary: 224 mg/dL — ABNORMAL HIGH (ref 70–99)

## 2017-09-04 LAB — URIC ACID: Uric Acid, Serum: 7.5 mg/dL (ref 3.7–8.6)

## 2017-09-04 MED ORDER — ALLOPURINOL 100 MG PO TABS: 100.0000 mg | ORAL_TABLET | Freq: Every day | ORAL | 0 refills | Status: DC

## 2017-09-04 MED ORDER — METFORMIN HCL 500 MG PO TABS: 500.0000 mg | ORAL_TABLET | Freq: Every day | ORAL | 0 refills | Status: DC

## 2017-09-04 MED ORDER — PREDNISONE 10 MG PO TABS: 10.0000 mg | ORAL_TABLET | Freq: Two times a day (BID) | ORAL | 0 refills | Status: DC

## 2017-09-04 MED ORDER — POTASSIUM CHLORIDE ER 10 MEQ PO CPCR: 10.0000 meq | ORAL_CAPSULE | Freq: Every day | ORAL | 0 refills | Status: DC

## 2017-09-04 MED ORDER — POTASSIUM CHLORIDE CRYS ER 20 MEQ PO TBCR
20.0000 meq | EXTENDED_RELEASE_TABLET | Freq: Once | ORAL | Status: AC
  Administered 2017-09-04: 10 meq via ORAL
  Filled 2017-09-04: qty 1

## 2017-09-04 MED ORDER — APIXABAN 5 MG PO TABS: 5.0000 mg | ORAL_TABLET | Freq: Two times a day (BID) | ORAL | 0 refills | Status: DC

## 2017-09-04 MED ORDER — METHOTREXATE 2.5 MG PO TABS: 10.0000 mg | ORAL_TABLET | ORAL | 0 refills | Status: DC

## 2017-09-04 MED ORDER — POTASSIUM CHLORIDE CRYS ER 20 MEQ PO TBCR
40.0000 meq | EXTENDED_RELEASE_TABLET | Freq: Once | ORAL | Status: AC
  Administered 2017-09-04: 40 meq via ORAL
  Filled 2017-09-04: qty 2

## 2017-09-04 NOTE — Discharge Summary (Signed)
Discharge Summary  [name] SKS:[id number] DOB: Jun 14, 1945  PCP: Chad Real, MD  Admit date: 09/02/2017 Discharge date: 09/04/2017  Time spent: , more than 50% time spent on coordination of care  Recommendations for Outpatient Follow-up:  1. F/u with PMD within a week  for hospital discharge follow up, repeat cbc/bmp at follow up. 2. F/u with cardiology/heart failure clinic, lasix held per cardiology recommendation, need to monitor volume status, further diuretic management per cardiology 3. F/u with rheumatology Dr Chad [facility] for RA, patient is instructed to hold methotraxate for now due to elevated lft 4. F/u with pulmonology Dr Chad [facility] for COPD, patient reports he has been on prednisone since 6/ 2019. Need to taper.  5. Home health arranged  Discharge Diagnoses:  Active Hospital Problems   Diagnosis Date Noted  . Acute renal failure superimposed on stage 3 chronic kidney disease (HCC) 08/13/2017  . Goals of care, counseling/discussion   . Palliative care by specialist   . Hypokalemia 09/02/2017  . Hypomagnesemia 09/02/2017  . Controlled type 2 diabetes mellitus with hyperglycemia (HCC) 09/02/2017  . Urinary incontinence 09/02/2017  . Orthostatic hypotension 08/13/2017  . Chronic diastolic CHF (congestive heart failure) (HCC) 07/20/2017  . Bilateral leg edema 07/20/2017  . Morbid obesity due to excess calories (HCC) 09/08/2016  . Rheumatoid arthritis (HCC) 03/31/2012  . COPD GOLD III  02/12/2011  . Sleep apnea 02/12/2011    Resolved Hospital Problems  No resolved problems to display.    Discharge Condition: stable  Diet recommendation: heart healthy/carb modified  Filed Weights   09/02/17 0843 09/03/17 0500  Weight: 125.2 kg (276 lb) 100.6 kg (221 lb 12.8 oz)    History of present illness:  PCP: Chad Real, MD  Patient coming from: Home  I have personally briefly reviewed patient's old medical records in [facility]  Chief Complaint: This of breath since  June.  Able to get his medications (he physically had them could not get to them)  HPI: Chad [facility] is a 72 y.o. male with medical history significant of stylet congestive heart failure, hypertension, hyperlipidemia, diabetes mellitus type 2, on insulin, COPD on 2 L nasal cannula oxygen, gastroesophageal reflux disease, gout, rheumatoid arthritis on prednisone, DVT/PE on Eliquis, abdominal aortic aneurysm, chronic kidney disease stage III, varicose veins, obstructive sleep apnea on BiPAP who complains of dizziness and lightheadedness exactly the same as his previous admission on July 5.  Third admission since June 10 and fourth visit to the emergency department.  Patient states he is having difficulty caring for himself at home.  Last admission he was evaluated by physical therapy for possible skilled nursing facility placement but was to functional to qualify and was referred to home health.  He had recommended assisted living but that does not appear to have been pursued by the patient.  The emergency department he was evaluated and found to have tachycardia, hypokalemia, a creatinine that elevated from his baseline of 1.5-1.7 that is now 2.91.  Hemoglobin of 11.9 with an elevated MCV, a magnesium level of 1.2 and elevated LFTs.  At discharge from our hospital last time the patient was instructed to stop his Lasix.  He saw Dr. Wynelle [facility] in follow-up who recommended he restart his Lasix at 40 mg twice daily due to increasing lower extremity edema.  Edematous, with renal failure, hypokalemic hypomagnesemic and with a troponin suggestive of strain (no chest pain).  Truly difficult to ascertain if the patient has been able to take any of his medications at home.  In the emergency department he stated that he was experiencing progressive shortness of breath with lightheadedness and dizziness.  He is been having difficulty with standing and getting out of his chair due to dizziness.  Today he was so short of  breath he was unable to reach his medicines to take them.  Shortness of breath is chronic but became worse in June.  A few weeks ago he had a fall which hit his head and since then he is having having difficulties with coughing.  Also mentions urinary incontinence and in the emergency department a bladder scan which showed 206 mL.    Hospital Course:  Principal Problem:   Acute renal failure superimposed on stage 3 chronic kidney disease (HCC) Active Problems:   COPD GOLD III    Sleep apnea   Rheumatoid arthritis (HCC)   Morbid obesity due to excess calories (HCC)   Chronic diastolic CHF (congestive heart failure) (HCC)   Bilateral leg edema   Orthostatic hypotension   Hypokalemia   Hypomagnesemia   Controlled type 2 diabetes mellitus with hyperglycemia (HCC)   Urinary incontinence   Goals of care, counseling/discussion   Palliative care by specialist   AKI on CKDIII Urine culture with multiple species, no fever, he reports urinary incontinence , denies urinary symptom at discharge aki from Cardiorenal vs overdiuresis He dose has bilateral lower extremity pitting edema, cxr no pulmonary edema He has significant orthostatic hypotension on presentation Cardiology consulted who think symptom due to overdiresis and recommended holding diuretics Diuretic held since admission, cr appear improving Patient reports feeling better  Hypokalemia/hypomagnesemia Replaced,  recheck at hospital discharge follow up.  lft elevation From hypoperfusion? From liver congestion? He denies abdominal pain, no n/v liver US no acute findings, + fatty liver I have discuss with patient about holding methotraxate, repeat lft at hospital follow up lft improving  Chronic diastolic chf presented with orthostatic hypotension Hard to access volume status due to reported chronic bilateral lower extremity edema cxr no pulmonary edema, no pleural effusion Cardiology consulted  diuretics held held per  cardiology recommendation, he is to follow up with cardiology on 7/31, he is referred to heart failure clinic.  Chronic bilateral lower extremity edema H/o DVT and PE on eliquis Compression stocking, elevate legs  noninsulin dependent dm2, steroids induced? a1c 8 Home meds metformin held in the hospital, resumed at decreased dose.  Lactic acidosis He does not appear septic, no sign of infection From metformin? From tissue hypoperfusion due to orthostatic hypotension/dehydration? Hold metformin and diuretics improving  COPD, home o2 dependent  He reports has been on daily steroids since last discharge Follow up with pulmonology Dr Chad [facility]  RA/immunosupressed status infliximab q6wks, On methotraxate and daily prednisone Hold methotraxate due to elevated lft Follow up with rheumatology , repeat liver function at follow up.  Macrocytic anemia  Has been on methotraxate hgb stable around 11.  Body mass index is 34.74 kg/m. OSA on bipap at home  FTT/falls Reports falls x4 last 6months, frequent ED visits on 6/23, 7/4 , 7/9 then 7/25  PT/OTeval home health arranged, Patient reports he can not afford ALF  Palliative care consulted: "He could benefit from further goals of care conversation as his clinical condition declines.  Those services can be accessed through either, [facility] at [phone]; [facility] palliative medicine [facility], which can be accessed at [phone];"    Code Status: DNR  Family Communication: patient   Disposition Plan: home with home health at  discharge Patient reports he can not afford ALF   Consultants:  Palliative care  Procedures:  none  Antibiotics:  none   Discharge Exam: BP 102/74 (BP Location: Right Arm)   Pulse 100   Temp 97.6 F (36.4 C)   Resp (!) 21   Ht 5\' 7"  (1.702 m)   Wt 100.6 kg (221 lb 12.8 oz)   SpO2 95%   BMI 34.74 kg/m     General:   NAD  Cardiovascular: RRR  Respiratory: intermittent mild wheezes, reports at baseline  Abdomen: Soft/ND/NT, positive BS  Musculoskeletal: bilateral lower extremity pitting edema  Neuro: alert, oriented    Discharge Instructions You were cared for by a hospitalist during your hospital stay. If you have any questions about your discharge medications or the care you received while you were in the hospital after you are discharged, you can call the unit and asked to speak with the hospitalist on call if the hospitalist that took care of you is not available. Once you are discharged, your primary care physician will handle any further medical issues. Please note that NO REFILLS for any discharge medications will be authorized once you are discharged, as it is imperative that you return to your primary care physician (or establish a relationship with a primary care physician if you do not have one) for your aftercare needs so that they can reassess your need for medications and monitor your lab values.  Discharge Instructions    Amb Referral to HF Clinic   Complete by:  As directed    Diet - low sodium heart healthy   Complete by:  As directed    Carb modified diet   Increase activity slowly   Complete by:  As directed      Allergies as of 09/04/2017      Reactions   Chantix [varenicline] Other (See Comments)   Suicidal thoughts      Medication List    STOP taking these medications   febuxostat 40 MG tablet Commonly known as:  ULORIC   furosemide 40 MG tablet Commonly known as:  LASIX     TAKE these medications   albuterol 108 (90 Base) MCG/ACT inhaler Commonly known as:  PROVENTIL HFA;VENTOLIN HFA Inhale 2 puffs into the lungs every 4 (four) hours as needed for wheezing or shortness of breath.   allopurinol 100 MG tablet Commonly known as:  ZYLOPRIM Take 1 tablet (100 mg total) by mouth daily.   apixaban 5 MG Tabs tablet Commonly known as:  ELIQUIS Take 1 tablet (5 mg  total) by mouth 2 (two) times daily.   aspirin EC 81 MG tablet Take 81 mg by mouth daily.   atorvastatin 40 MG tablet Commonly known as:  LIPITOR Take 1 tablet (40 mg total) by mouth daily.   cetirizine 10 MG tablet Commonly known as:  ZYRTEC Take 10 mg by mouth daily.   CITRACAL CALCIUM+D 600-40-500 MG-MG-UNIT Tb24 Generic drug:  Calcium-Magnesium-Vitamin D Take 1 tablet by mouth daily.   cyanocobalamin 2000 MCG tablet Take 2,500 mcg by mouth daily.   FLUTTER Devi Use as directed   folic acid 400 MCG tablet Commonly known as:  FOLVITE Take 1,200 mcg by mouth daily.   guaiFENesin 600 MG 12 hr tablet Commonly known as:  MUCINEX Take 1,200 mg by mouth 2 (two) times daily.   ipratropium-albuterol 0.5-2.5 (3) MG/3ML Soln Commonly known as:  DUONEB Take 3 mLs by nebulization every 6 (six) hours.   metFORMIN 500 MG  tablet Commonly known as:  GLUCOPHAGE Take 1 tablet (500 mg total) by mouth daily with breakfast. What changed:  when to take this   methotrexate 2.5 MG tablet Commonly known as:  RHEUMATREX Take 4 tablets (10 mg total) by mouth once a week. Please hold methotrexate for now, please follow up with Dr [facility] for further instruction regarding methotrazate. What changed:  additional instructions   mometasone-formoterol 200-5 MCG/ACT Aero Commonly known as:  DULERA Inhale 2 puffs into the lungs 2 (two) times daily.   multivitamin tablet Take 1 tablet by mouth daily.   OXYGEN BIPAP with 1lpm o2   pantoprazole 40 MG tablet Commonly known as:  PROTONIX Take 40 mg by mouth 2 (two) times daily.   potassium chloride 10 MEQ CR capsule Commonly known as:  MICRO-K Take 1 capsule (10 mEq total) by mouth daily. What changed:  when to take this   predniSONE 10 MG tablet Commonly known as:  DELTASONE Take 1 tablet (10 mg total) by mouth 2 (two) times daily.   REMICADE IV Inject into the vein every 6 (six) weeks.   Tiotropium Bromide Monohydrate 2.5 MCG/ACT  Aers Commonly known as:  SPIRIVA RESPIMAT Inhale 2 puffs into the lungs daily.   vitamin C 1000 MG tablet Take 1,000 mg by mouth daily.      Allergies  Allergen Reactions  . Chantix [Varenicline] Other (See Comments)    Suicidal thoughts   Follow-up Information    [facility] Follow up.   Specialty:  Home Health Services Why:  HHRN, HHPT, HHOT Contact information: [address] [place] [address] [phone]        Chad Real, MD Follow up.   Specialty:  Internal Medicine Contact information: [address] [place] [address] [phone]        Lewayne Bunting, MD Follow up.   Specialty:  Cardiology Why:  cardiology to monitor renal function and further diuretic adjustment Contact information: [address] [place] [address] [phone]        Nyoka Cowden, MD Follow up.   Specialty:  Pulmonary Disease Why:  for copd management, to discuss prednisone taper Contact information: [address] [place] [address] [phone]        Donnetta Hail, MD Follow up.   Specialty:  Rheumatology Why:  methotraxate use and  monitor liver function Contact information: [address] [place] [address] [place] [address] [phone]        repeat labs at hospital follow up. close follow kidney and liver function with your doctor. Follow up.            The results of significant diagnostics from this hospitalization (including imaging, microbiology, ancillary and laboratory) are listed below for reference.    Significant Diagnostic Studies: Dg Chest 2 View  Result Date: 08/12/2017 CLINICAL DATA:  72 y/o  M; fall and generalized weakness. EXAM: CHEST - 2 VIEW COMPARISON:  07/20/2017 CT angiogram of the chest. 07/19/2017 chest radiograph. FINDINGS: Stable heart size and mediastinal contours are within normal limits. Both lungs are clear. Partially visualized left shoulder arthroplasty and  degenerative changes. No acute osseous abnormality is evident. IMPRESSION: No acute pulmonary process identified. Electronically Signed   By: Mitzi Hansen M.D.   On: 08/12/2017 23:35   Dg Ribs Unilateral W/chest Right  Result Date: 08/17/2017 CLINICAL DATA:  Right-sided chest pain after tripping and falling. History of COPD and hypertension. EXAM: RIGHT RIBS  AND CHEST - 3+ VIEW COMPARISON:  Radiographs 08/12/2017.  CT 07/20/2017. FINDINGS: The heart size and mediastinal contours are stable. There is a 2.4 cm nodular density projecting over the left heart border on the frontal examination, unchanged from recent chest radiographs and not clearly explained by CT done 4 weeks ago. The lungs are otherwise clear. There is no pleural effusion or pneumothorax. There are mildly displaced fractures of the right 7th and 8th ribs antral laterally, best seen on the oblique view. Left shoulder arthroplasty noted. IMPRESSION: 1. Acute mildly displaced fractures of the right 7th and 8th ribs. No pneumothorax or significant pleural effusion. 2. Nodular density projecting over the left heart border on the frontal examination may be related to recently demonstrated acute pulmonary embolism. There was no definite corresponding finding on the CT performed 07/20/2017. Attention on follow-up in 1-2 months recommended. Electronically Signed   By: Carey Bullocks M.D.   On: 08/17/2017 15:47   Ct Head Wo Contrast  Result Date: 08/17/2017 CLINICAL DATA:  Larey Seat outside striking curb, maxillofacial blunt trauma EXAM: CT HEAD WITHOUT CONTRAST CT MAXILLOFACIAL WITHOUT CONTRAST CT CERVICAL SPINE WITHOUT CONTRAST TECHNIQUE: Multidetector CT imaging of the head, cervical spine, and maxillofacial structures were performed using the standard protocol without intravenous contrast. Multiplanar CT image reconstructions of the cervical spine and maxillofacial structures were also generated. Right side of face marked with BB. COMPARISON:   CT head and cervical spine 08/12/2017 FINDINGS: CT HEAD FINDINGS Brain: Generalized atrophy. Normal ventricular morphology. No midline shift or mass effect. Small vessel chronic ischemic changes of deep cerebral white matter. No intracranial hemorrhage, mass lesion, evidence of acute infarction, or extra-axial fluid collection. Vascular: Minimal atherosclerotic calcification within internal carotid arteries at skull base. Skull: Bones demineralized but intact Other: N/A CT MAXILLOFACIAL FINDINGS Osseous: Osseous demineralization. TMJ alignment normal bilaterally. Few scattered beam hardening artifacts of dental origin. Minimal nasal septal deviation to the LEFT. Zygomas intact. Orbits and sinuses intact. No facial bone fractures identified. N/A Orbits: Intraorbital soft tissue planes clear. No orbital pneumatosis or fluid collection. Sinuses: Tiny mucosal retention cysts LEFT maxillary sinus. Paranasal sinuses, mastoid air cells and middle ear cavities otherwise clear. Soft tissues: Unremarkable CT CERVICAL SPINE FINDINGS Alignment: Unchanged. Mild anterolisthesis at C2-C3, 2.6 mm, unchanged. Remaining alignments normal. Skull base and vertebrae: Skull base intact. Bones demineralized. Degradation of image quality secondary to beam hardening from the shoulders and minimal motion. No gross evidence of fracture or subluxation identified. Mild disc space narrowing at C5-C6 and C6-C7, less at remaining cervical discs. Uncovertebral spurs encroach upon the RIGHT C5-C6 neural foramen Soft tissues and spinal canal: Prevertebral soft tissues normal thickness. Atherosclerotic calcifications at the carotid bifurcations bilaterally. Disc levels:  No significant abnormalities Upper chest: Lung apices clear. Other: N/A IMPRESSION: Atrophy with small vessel chronic ischemic changes of deep cerebral white matter. No acute intracranial abnormalities. No acute facial bone abnormalities. Mild degenerative disc disease changes of the  cervical spine without acute cervical spine abnormalities. Electronically Signed   By: Ulyses Southward M.D.   On: 08/17/2017 16:22   Ct Head Wo Contrast  Result Date: 08/12/2017 CLINICAL DATA:  Syncopal episode, hit head on floor EXAM: CT HEAD WITHOUT CONTRAST CT CERVICAL SPINE WITHOUT CONTRAST TECHNIQUE: Multidetector CT imaging of the head and cervical spine was performed following the standard protocol without intravenous contrast. Multiplanar CT image reconstructions of the cervical spine were also generated. COMPARISON:  Report 02/26/2007 FINDINGS: CT HEAD FINDINGS Brain: No acute territorial infarction, hemorrhage or intracranial  mass. Moderate atrophy. Prominent ventricles, felt secondary to atrophy. Vascular: No hyperdense vessels. Scattered calcifications at the carotid siphons. Skull: Normal. Negative for fracture or focal lesion. Sinuses/Orbits: No acute finding. Patchy opacification of the ethmoid sinuses. Other: None CT CERVICAL SPINE FINDINGS Alignment: Reversal of cervical lordosis. No subluxation. Facet alignment within normal limits. Skull base and vertebrae: No acute fracture. No primary bone lesion or focal pathologic process. Soft tissues and spinal canal: No prevertebral fluid or swelling. No visible canal hematoma. Disc levels: Moderate degenerative changes at C5-C6. Mild degenerative changes at C3-C4 and C4-C5. Upper chest: Negative. Other: None IMPRESSION: 1. No CT evidence for acute intracranial abnormality.  Atrophy. 2. Reversal of cervical lordosis.  No acute osseous abnormality. Electronically Signed   By: Jasmine Pang M.D.   On: 08/12/2017 23:23   Ct Cervical Spine Wo Contrast  Result Date: 08/17/2017 CLINICAL DATA:  Larey Seat outside striking curb, maxillofacial blunt trauma EXAM: CT HEAD WITHOUT CONTRAST CT MAXILLOFACIAL WITHOUT CONTRAST CT CERVICAL SPINE WITHOUT CONTRAST TECHNIQUE: Multidetector CT imaging of the head, cervical spine, and maxillofacial structures were performed using  the standard protocol without intravenous contrast. Multiplanar CT image reconstructions of the cervical spine and maxillofacial structures were also generated. Right side of face marked with BB. COMPARISON:  CT head and cervical spine 08/12/2017 FINDINGS: CT HEAD FINDINGS Brain: Generalized atrophy. Normal ventricular morphology. No midline shift or mass effect. Small vessel chronic ischemic changes of deep cerebral white matter. No intracranial hemorrhage, mass lesion, evidence of acute infarction, or extra-axial fluid collection. Vascular: Minimal atherosclerotic calcification within internal carotid arteries at skull base. Skull: Bones demineralized but intact Other: N/A CT MAXILLOFACIAL FINDINGS Osseous: Osseous demineralization. TMJ alignment normal bilaterally. Few scattered beam hardening artifacts of dental origin. Minimal nasal septal deviation to the LEFT. Zygomas intact. Orbits and sinuses intact. No facial bone fractures identified. N/A Orbits: Intraorbital soft tissue planes clear. No orbital pneumatosis or fluid collection. Sinuses: Tiny mucosal retention cysts LEFT maxillary sinus. Paranasal sinuses, mastoid air cells and middle ear cavities otherwise clear. Soft tissues: Unremarkable CT CERVICAL SPINE FINDINGS Alignment: Unchanged. Mild anterolisthesis at C2-C3, 2.6 mm, unchanged. Remaining alignments normal. Skull base and vertebrae: Skull base intact. Bones demineralized. Degradation of image quality secondary to beam hardening from the shoulders and minimal motion. No gross evidence of fracture or subluxation identified. Mild disc space narrowing at C5-C6 and C6-C7, less at remaining cervical discs. Uncovertebral spurs encroach upon the RIGHT C5-C6 neural foramen Soft tissues and spinal canal: Prevertebral soft tissues normal thickness. Atherosclerotic calcifications at the carotid bifurcations bilaterally. Disc levels:  No significant abnormalities Upper chest: Lung apices clear. Other: N/A  IMPRESSION: Atrophy with small vessel chronic ischemic changes of deep cerebral white matter. No acute intracranial abnormalities. No acute facial bone abnormalities. Mild degenerative disc disease changes of the cervical spine without acute cervical spine abnormalities. Electronically Signed   By: Ulyses Southward M.D.   On: 08/17/2017 16:22   Ct Cervical Spine Wo Contrast  Result Date: 08/12/2017 CLINICAL DATA:  Syncopal episode, hit head on floor EXAM: CT HEAD WITHOUT CONTRAST CT CERVICAL SPINE WITHOUT CONTRAST TECHNIQUE: Multidetector CT imaging of the head and cervical spine was performed following the standard protocol without intravenous contrast. Multiplanar CT image reconstructions of the cervical spine were also generated. COMPARISON:  Report 02/26/2007 FINDINGS: CT HEAD FINDINGS Brain: No acute territorial infarction, hemorrhage or intracranial mass. Moderate atrophy. Prominent ventricles, felt secondary to atrophy. Vascular: No hyperdense vessels. Scattered calcifications at the carotid siphons. Skull: Normal. Negative  for fracture or focal lesion. Sinuses/Orbits: No acute finding. Patchy opacification of the ethmoid sinuses. Other: None CT CERVICAL SPINE FINDINGS Alignment: Reversal of cervical lordosis. No subluxation. Facet alignment within normal limits. Skull base and vertebrae: No acute fracture. No primary bone lesion or focal pathologic process. Soft tissues and spinal canal: No prevertebral fluid or swelling. No visible canal hematoma. Disc levels: Moderate degenerative changes at C5-C6. Mild degenerative changes at C3-C4 and C4-C5. Upper chest: Negative. Other: None IMPRESSION: 1. No CT evidence for acute intracranial abnormality.  Atrophy. 2. Reversal of cervical lordosis.  No acute osseous abnormality. Electronically Signed   By: Jasmine Pang M.D.   On: 08/12/2017 23:23   Dg Chest Port 1 View  Result Date: 09/02/2017 CLINICAL DATA:  Shortness of breath EXAM: PORTABLE CHEST 1 VIEW  COMPARISON:  08/17/2017 FINDINGS: Cardiac shadow is stable. Lungs are well aerated bilaterally. Previously seen nodular density over the left lung base is no longer identified. Some mild atelectatic changes are noted bilaterally. No acute bony abnormality is seen. Postsurgical changes in left shoulder are noted. IMPRESSION: Mild bibasilar atelectasis. Electronically Signed   By: Alcide Clever M.D.   On: 09/02/2017 09:40   Ct Maxillofacial Wo Cm  Result Date: 08/17/2017 CLINICAL DATA:  Larey Seat outside striking curb, maxillofacial blunt trauma EXAM: CT HEAD WITHOUT CONTRAST CT MAXILLOFACIAL WITHOUT CONTRAST CT CERVICAL SPINE WITHOUT CONTRAST TECHNIQUE: Multidetector CT imaging of the head, cervical spine, and maxillofacial structures were performed using the standard protocol without intravenous contrast. Multiplanar CT image reconstructions of the cervical spine and maxillofacial structures were also generated. Right side of face marked with BB. COMPARISON:  CT head and cervical spine 08/12/2017 FINDINGS: CT HEAD FINDINGS Brain: Generalized atrophy. Normal ventricular morphology. No midline shift or mass effect. Small vessel chronic ischemic changes of deep cerebral white matter. No intracranial hemorrhage, mass lesion, evidence of acute infarction, or extra-axial fluid collection. Vascular: Minimal atherosclerotic calcification within internal carotid arteries at skull base. Skull: Bones demineralized but intact Other: N/A CT MAXILLOFACIAL FINDINGS Osseous: Osseous demineralization. TMJ alignment normal bilaterally. Few scattered beam hardening artifacts of dental origin. Minimal nasal septal deviation to the LEFT. Zygomas intact. Orbits and sinuses intact. No facial bone fractures identified. N/A Orbits: Intraorbital soft tissue planes clear. No orbital pneumatosis or fluid collection. Sinuses: Tiny mucosal retention cysts LEFT maxillary sinus. Paranasal sinuses, mastoid air cells and middle ear cavities otherwise  clear. Soft tissues: Unremarkable CT CERVICAL SPINE FINDINGS Alignment: Unchanged. Mild anterolisthesis at C2-C3, 2.6 mm, unchanged. Remaining alignments normal. Skull base and vertebrae: Skull base intact. Bones demineralized. Degradation of image quality secondary to beam hardening from the shoulders and minimal motion. No gross evidence of fracture or subluxation identified. Mild disc space narrowing at C5-C6 and C6-C7, less at remaining cervical discs. Uncovertebral spurs encroach upon the RIGHT C5-C6 neural foramen Soft tissues and spinal canal: Prevertebral soft tissues normal thickness. Atherosclerotic calcifications at the carotid bifurcations bilaterally. Disc levels:  No significant abnormalities Upper chest: Lung apices clear. Other: N/A IMPRESSION: Atrophy with small vessel chronic ischemic changes of deep cerebral white matter. No acute intracranial abnormalities. No acute facial bone abnormalities. Mild degenerative disc disease changes of the cervical spine without acute cervical spine abnormalities. Electronically Signed   By: Ulyses Southward M.D.   On: 08/17/2017 16:22   US Abdomen Limited Ruq  Result Date: 09/04/2017 CLINICAL DATA:  Elevated liver function studies.  Increased BMI. EXAM: ULTRASOUND ABDOMEN LIMITED RIGHT UPPER QUADRANT COMPARISON:  None. FINDINGS: Gallbladder: Gallbladder is somewhat  contracted, likely physiologic. No stones, sludge, or gallbladder wall thickening. Murphy's sign is negative. Common bile duct: Diameter: 2.9 mm, normal Liver: Diffusely increased hepatic parenchymal echotexture consistent with diffuse fatty infiltration. No focal lesions identified. Poor penetration due to fatty infiltration limits examination. Portal vein is patent on color Doppler imaging with normal direction of blood flow towards the liver. IMPRESSION: No evidence of cholelithiasis or cholecystitis. Fatty infiltration of the liver. Electronically Signed   By: Burman Nieves M.D.   On: 09/04/2017  06:29    Microbiology: Recent Results (from the past 240 hour(s))  Urine culture     Status: Abnormal   Collection Time: 09/02/17 11:38 AM  Result Value Ref Range Status   Specimen Description URINE, RANDOM  Final   Special Requests   Final    NONE Performed at [facility], [address], [place], [place] [address]    Culture MULTIPLE SPECIES PRESENT, SUGGEST RECOLLECTION (A)  Final   Report Status 09/03/2017 FINAL  Final     Labs: Basic Metabolic Panel: Recent Labs  Lab 09/02/17 0907 09/03/17 0304 09/04/17 0237  NA 135 133* 135  K 2.7* 3.0* 3.5  CL 83* 86* 90*  CO2 32 32 30  GLUCOSE 178* 209* 193*  BUN 69* 66* 61*  CREATININE 2.91* 2.85* 2.29*  CALCIUM 8.7* 8.5* 8.8*  MG 1.2* 1.7 1.9   Liver Function Tests: Recent Labs  Lab 09/02/17 0907 09/03/17 0304 09/04/17 0237  AST 77* 67* 53*  ALT 121* 113* 100*  ALKPHOS 71 66 62  BILITOT 1.2 1.0 0.8  PROT 6.4* 6.0* 5.8*  ALBUMIN 3.1* 2.9* 2.8*   No results for input(s): LIPASE, AMYLASE in the last 168 hours. No results for input(s): AMMONIA in the last 168 hours. CBC: Recent Labs  Lab 09/02/17 0907 09/03/17 0304  WBC 7.6 7.9  NEUTROABS 6.0  --   HGB 11.9* 10.6*  HCT 35.1* 32.1*  MCV 102.3* 102.2*  PLT 203 198   Cardiac Enzymes: Recent Labs  Lab 09/02/17 0907  TROPONINI 0.06*   BNP: BNP (last 3 results) Recent Labs    07/19/17 1842 08/12/17 2238 09/02/17 0907  BNP 45.4 55.9 60.2    ProBNP (last 3 results) No results for input(s): PROBNP in the last 8760 hours.  CBG: Recent Labs  Lab 09/03/17 1150 09/03/17 1700 09/03/17 2134 09/04/17 0731 09/04/17 1209  GLUCAP 173* 238* 252* 203* 224*       Signed:  Albertine Grates MD, PhD  [facility] 09/04/2017, 12:42 PM

## 2017-09-06 ENCOUNTER — Other Ambulatory Visit: Payer: Self-pay | Admitting: *Deleted

## 2017-09-06 NOTE — Patient Outreach (Signed)
Made aware by Kandee Keen with [facility] that Mr. Giraud enrolled in the [facility] First program.   Telephone call to Mr. Minner to make aware that [facility] Care Management will assist if transportation or medication needs are identified while on the [facility] First program. He currently denies having any transportation or pharmacy needs at this time however.  Will send notification to [facility] Care Management office to make aware of Mr. [facility] First enrollment.   Raiford Noble, MSN-Ed, RN,BSN [facility] Liaison [phone]

## 2017-09-07 ENCOUNTER — Other Ambulatory Visit: Payer: Self-pay

## 2017-09-07 DIAGNOSIS — N179 Acute kidney failure, unspecified: Secondary | ICD-10-CM | POA: Diagnosis not present

## 2017-09-07 DIAGNOSIS — I130 Hypertensive heart and chronic kidney disease with heart failure and stage 1 through stage 4 chronic kidney disease, or unspecified chronic kidney disease: Secondary | ICD-10-CM | POA: Diagnosis not present

## 2017-09-07 NOTE — Patient Outreach (Signed)
[facility]) Care Management  09/07/2017  Faizon Capozzi 04-15-1945 [id number]   Request received from Clearence Cheek at [facility] to arrange transportation to upcoming appointments.  Transportation was arranged via [facility] [employer] to the following appointments:  09/08/17 @ 130pm  with Olga Millers       09/09/17 @ 230pm with Alben Deeds       09/16/17 @ 145pm  with Princella Pellegrini, BSW Social Worker [phone]

## 2017-09-08 ENCOUNTER — Ambulatory Visit (INDEPENDENT_AMBULATORY_CARE_PROVIDER_SITE_OTHER): Payer: Medicare Other | Admitting: Cardiology

## 2017-09-08 ENCOUNTER — Encounter: Payer: Self-pay | Admitting: Cardiology

## 2017-09-08 VITALS — BP 102/68 | HR 117 | Ht 67.0 in | Wt 229.0 lb

## 2017-09-08 DIAGNOSIS — Z86711 Personal history of pulmonary embolism: Secondary | ICD-10-CM

## 2017-09-08 DIAGNOSIS — I5032 Chronic diastolic (congestive) heart failure: Secondary | ICD-10-CM | POA: Diagnosis not present

## 2017-09-08 DIAGNOSIS — I10 Essential (primary) hypertension: Secondary | ICD-10-CM

## 2017-09-08 DIAGNOSIS — N189 Chronic kidney disease, unspecified: Secondary | ICD-10-CM

## 2017-09-08 DIAGNOSIS — I2510 Atherosclerotic heart disease of native coronary artery without angina pectoris: Secondary | ICD-10-CM

## 2017-09-08 DIAGNOSIS — N179 Acute kidney failure, unspecified: Secondary | ICD-10-CM | POA: Diagnosis not present

## 2017-09-08 DIAGNOSIS — I2584 Coronary atherosclerosis due to calcified coronary lesion: Secondary | ICD-10-CM | POA: Diagnosis not present

## 2017-09-08 DIAGNOSIS — I130 Hypertensive heart and chronic kidney disease with heart failure and stage 1 through stage 4 chronic kidney disease, or unspecified chronic kidney disease: Secondary | ICD-10-CM | POA: Diagnosis not present

## 2017-09-08 NOTE — Patient Instructions (Signed)
Medication Instructions:  Your physician recommends that you continue on your current medications as directed. Please refer to the Current Medication list given to you today.  Labwork: None   Testing/Procedures: None   Follow-Up: Your provider recommends that you schedule a follow-up appointment in: 3 months with Dr Jens Som.  Any Other Special Instructions Will Be Listed Below (If Applicable). If you need a refill on your cardiac medications before your next appointment, please call your pharmacy.

## 2017-09-08 NOTE — Assessment & Plan Note (Signed)
PE June 2019 as well as DVT- he is on Eliquis

## 2017-09-08 NOTE — Progress Notes (Signed)
09/08/2017 Chad Duke   [birth year]  [id number]  Primary Physician [name], MD Primary Cardiologist: Dr Jens Som  HPI:  72 y/o chronically ill obese male, followed by Dr Wynelle Link at [facility] with a history of Rheumatoid arthritis (Dr Marzetta Merino), NIDDM, HLD, DVT RLE and PE June 2019-on Eliquis, CRI-3 obesity and OSA,  He saw Dr Jens Som in April 2019 for coronary Ca++ on CT scan. Myoview was low risk. Echo done Feb 2019 showed normal LVF, mild LVH, grade 1 DD. The pt also has an AAA- 3.0 cm, followed by Dr Arbie Cookey.   He has had multiple admissions to the hospital with weakness. He is not really able to take care of himself at home and is unable to go to an assisted living facility. He was seen in the ED in consult 09/02/17 for acute on chronic renal insufficiency which we felt was secondary to diuretics. He was admitted by the hospitalitis and followed. He was seen by Palliative Care and made it know he wishes to be a DNR. He was discharged 09/04/17 with instructions to f/u with me today in the clinic.   The patient looks chronically ill. He is pale, he has multiple areas of ecchymosis. He is SOB with any exertion. He tells me his LE edema is coming back. He struggles to get his compression stockings on by himself.  He denies chest pain or increased dyspnea. He has not taken Lasix since discharge.    Current Outpatient Medications  Medication Sig Dispense Refill  . albuterol (PROVENTIL HFA;VENTOLIN HFA) 108 (90 Base) MCG/ACT inhaler Inhale 2 puffs into the lungs every 4 (four) hours as needed for wheezing or shortness of breath. 3 Inhaler 1  . allopurinol (ZYLOPRIM) 100 MG tablet Take 1 tablet (100 mg total) by mouth daily. 30 tablet 0  . apixaban (ELIQUIS) 5 MG TABS tablet Take 1 tablet (5 mg total) by mouth 2 (two) times daily. 1 tablet 0  . Ascorbic Acid (VITAMIN C) 1000 MG tablet Take 1,000 mg by mouth daily.    Marland Kitchen aspirin EC 81 MG tablet Take 81 mg by mouth daily.    .  Calcium-Magnesium-Vitamin D (CITRACAL CALCIUM+D) 600-40-500 MG-MG-UNIT TB24 Take 1 tablet by mouth daily.      . cetirizine (ZYRTEC) 10 MG tablet Take 10 mg by mouth daily.      . cyanocobalamin 2000 MCG tablet Take 2,500 mcg by mouth daily.     . folic acid (FOLVITE) 400 MCG tablet Take 1,200 mcg by mouth daily.    Marland Kitchen guaiFENesin (MUCINEX) 600 MG 12 hr tablet Take 1,200 mg by mouth 2 (two) times daily.    . InFLIXimab (REMICADE IV) Inject into the vein every 6 (six) weeks.    Marland Kitchen ipratropium-albuterol (DUONEB) 0.5-2.5 (3) MG/3ML SOLN Take 3 mLs by nebulization every 6 (six) hours.     . metFORMIN (GLUCOPHAGE) 500 MG tablet Take 1 tablet (500 mg total) by mouth daily with breakfast. 30 tablet 0  . methotrexate (RHEUMATREX) 2.5 MG tablet Take 4 tablets (10 mg total) by mouth once a week. Please hold methotrexate for now, please follow up with Dr Dierdre Forth for further instruction regarding methotrazate. 1 tablet 0  . mometasone-formoterol (DULERA) 200-5 MCG/ACT AERO Inhale 2 puffs into the lungs 2 (two) times daily. 1 Inhaler 0  . Multiple Vitamin (MULTIVITAMIN) tablet Take 1 tablet by mouth daily.      . OXYGEN BIPAP with 1lpm o2    . pantoprazole (PROTONIX) 40 MG tablet Take  40 mg by mouth 2 (two) times daily.     . potassium chloride (MICRO-K) 10 MEQ CR capsule Take 1 capsule (10 mEq total) by mouth daily. 30 capsule 0  . predniSONE (DELTASONE) 10 MG tablet Take 1 tablet (10 mg total) by mouth 2 (two) times daily. 1 tablet 0  . Respiratory Therapy Supplies (FLUTTER) DEVI Use as directed 1 each 0  . Tiotropium Bromide Monohydrate (SPIRIVA RESPIMAT) 2.5 MCG/ACT AERS Inhale 2 puffs into the lungs daily. 1 Inhaler 0  . atorvastatin (LIPITOR) 40 MG tablet Take 1 tablet (40 mg total) by mouth daily. 90 tablet 3  . metFORMIN (GLUCOPHAGE) 500 MG tablet Take 1 tablet (500 mg total) by mouth 2 (two) times daily with a meal. 60 tablet 0   No current facility-administered medications for this visit.      Allergies  Allergen Reactions  . Chantix [Varenicline] Other (See Comments)    Suicidal thoughts    Past Medical History:  Diagnosis Date  . AAA (abdominal aortic aneurysm) (HCC)   . Allergy    Rhinitis  . Arthritis    Rhuematoid  . Asthma   . Chronic renal insufficiency   . Diverticulosis   . Emphysema   . GERD (gastroesophageal reflux disease)   . Hearing loss of both ears   . Hiatal hernia   . Hyperlipidemia   . Hypertension   . Male hypogonadism   . Nephrolithiasis   . Nephrolithiasis   . Sleep apnea    On CPAP  . Stasis dermatitis   . Varicose veins   . Vitamin B 12 deficiency     Social History   Socioeconomic History  . Marital status: Divorced    Spouse name: Not on file  . Number of children: 2  . Years of education: Not on file  . Highest education level: Not on file  Occupational History  . Not on file  Social Needs  . Financial resource strain: Not on file  . Food insecurity:    Worry: Not on file    Inability: Not on file  . Transportation needs:    Medical: Not on file    Non-medical: Not on file  Tobacco Use  . Smoking status: Former Smoker    Packs/day: 0.10    Years: 53.00    Pack years: 5.30    Types: Cigarettes    Last attempt to quit: 08/11/2017    Years since quitting: 0.0  . Smokeless tobacco: Never Used  . Tobacco comment: 1-2 cigarettes a day   Substance and Sexual Activity  . Alcohol use: Not Currently    Alcohol/week: 0.0 oz  . Drug use: No  . Sexual activity: Not on file  Lifestyle  . Physical activity:    Days per week: Not on file    Minutes per session: Not on file  . Stress: Not on file  Relationships  . Social connections:    Talks on phone: Not on file    Gets together: Not on file    Attends religious service: Not on file    Active member of club or organization: Not on file    Attends meetings of clubs or organizations: Not on file    Relationship status: Not on file  . Intimate partner violence:     Fear of current or ex partner: Not on file    Emotionally abused: Not on file    Physically abused: Not on file    Forced sexual activity: Not  on file  Other Topics Concern  . Not on file  Social History Narrative  . Not on file     Family History  Problem Relation Age of Onset  . Emphysema Father   . Asthma Father   . Osteoarthritis Father   . Heart attack Mother   . Uterine cancer Mother   . Heart disease Mother   . ADD / ADHD Son   . Depression Son   . Hypertension Brother   . Heart disease Brother        Pacemaker   . Heart murmur Brother      Review of Systems: General: negative for chills, fever, night sweats or weight changes.  Cardiovascular: negative for chest pain, orthopnea, palpitations, paroxysmal nocturnal dyspnea  Dermatological: negative for rash Respiratory: negative for cough or wheezing Urologic: negative for hematuria Abdominal: negative for nausea, vomiting, diarrhea, bright red blood per rectum, melena, or hematemesis Neurologic: negative for visual changes, syncope, or dizziness All other systems reviewed and are otherwise negative except as noted above.    Blood pressure 102/68, pulse (!) 117, height 5\' 7"  (1.702 m), weight 229 lb (103.9 kg).  General appearance: alert, cooperative, appears older than stated age, no distress, morbidly obese and pale Lungs: decreased breath sounds, no rales Heart: regular rate and rhythm Extremities: 2+ bilateral LE edema to his knees Skin: pale, dry, multiple areas of ecchymosis Neurologic: Grossly normal  EKG NSR, ST, poor anterior RW  ASSESSMENT AND PLAN:   Acute kidney injury superimposed on chronic kidney disease (HCC) Lasix stopped at recent hospitalizations after his SCr went from 1.8 to 2.9  Chronic diastolic CHF (congestive heart failure) (HCC) Echo repeated June 2019- Normal LVF-EF 65-70%, mild LVH, grade 1 DD  Essential hypertension B/P borderline low  History of pulmonary embolus (PE) PE  June 2019 as well as DVT- he is on Eliquis  CAD (coronary artery disease) Coronary Ca++ on CT scan, Myoview low risk, no angina   PLAN  I suggested the patient keep his f/u with Dr July 2019 next week- will defer Lasix Rx to Dr Wynelle Link. It may be that his baseline SCr is closer to 2.0-2.5.   The pt is now DNR- Palliative Care has seen him. He is getting home care with a visiting RN and PT. We will see him back in 3 months. He can stop his ASA, I gave his some Eliquis samples until he can follow up with his PCP.   Wynelle Link PA-C 09/08/2017 2:34 PM

## 2017-09-08 NOTE — Assessment & Plan Note (Signed)
Echo repeated June 2019- Normal LVF-EF 65-70%, mild LVH, grade 1 DD

## 2017-09-08 NOTE — Assessment & Plan Note (Signed)
B/P borderline low 

## 2017-09-08 NOTE — Assessment & Plan Note (Signed)
Lasix stopped at recent hospitalizations after his SCr went from 1.8 to 2.9

## 2017-09-08 NOTE — Assessment & Plan Note (Signed)
Coronary Ca++ on CT scan, Myoview low risk, no angina

## 2017-09-09 DIAGNOSIS — N179 Acute kidney failure, unspecified: Secondary | ICD-10-CM | POA: Diagnosis not present

## 2017-09-09 DIAGNOSIS — I130 Hypertensive heart and chronic kidney disease with heart failure and stage 1 through stage 4 chronic kidney disease, or unspecified chronic kidney disease: Secondary | ICD-10-CM | POA: Diagnosis not present

## 2017-09-10 DIAGNOSIS — N179 Acute kidney failure, unspecified: Secondary | ICD-10-CM | POA: Diagnosis not present

## 2017-09-10 DIAGNOSIS — I130 Hypertensive heart and chronic kidney disease with heart failure and stage 1 through stage 4 chronic kidney disease, or unspecified chronic kidney disease: Secondary | ICD-10-CM | POA: Diagnosis not present

## 2017-09-12 ENCOUNTER — Emergency Department (HOSPITAL_COMMUNITY): Payer: Medicare Other

## 2017-09-12 ENCOUNTER — Emergency Department (HOSPITAL_COMMUNITY)
Admission: EM | Admit: 2017-09-12 | Discharge: 2017-09-12 | Disposition: A | Discharge status: Home / Self Care | Payer: Medicare Other | Attending: Emergency Medicine | Admitting: Emergency Medicine

## 2017-09-12 ENCOUNTER — Encounter (HOSPITAL_COMMUNITY): Payer: Self-pay

## 2017-09-12 DIAGNOSIS — Z79899 Other long term (current) drug therapy: Secondary | ICD-10-CM | POA: Insufficient documentation

## 2017-09-12 DIAGNOSIS — E039 Hypothyroidism, unspecified: Secondary | ICD-10-CM | POA: Diagnosis not present

## 2017-09-12 DIAGNOSIS — M2550 Pain in unspecified joint: Secondary | ICD-10-CM | POA: Diagnosis not present

## 2017-09-12 DIAGNOSIS — R05 Cough: Secondary | ICD-10-CM | POA: Insufficient documentation

## 2017-09-12 DIAGNOSIS — I130 Hypertensive heart and chronic kidney disease with heart failure and stage 1 through stage 4 chronic kidney disease, or unspecified chronic kidney disease: Secondary | ICD-10-CM | POA: Insufficient documentation

## 2017-09-12 DIAGNOSIS — I5032 Chronic diastolic (congestive) heart failure: Secondary | ICD-10-CM | POA: Diagnosis not present

## 2017-09-12 DIAGNOSIS — R000 Tachycardia, unspecified: Secondary | ICD-10-CM | POA: Diagnosis not present

## 2017-09-12 DIAGNOSIS — Z87891 Personal history of nicotine dependence: Secondary | ICD-10-CM | POA: Insufficient documentation

## 2017-09-12 DIAGNOSIS — J45909 Unspecified asthma, uncomplicated: Secondary | ICD-10-CM | POA: Insufficient documentation

## 2017-09-12 DIAGNOSIS — J9811 Atelectasis: Secondary | ICD-10-CM | POA: Diagnosis not present

## 2017-09-12 DIAGNOSIS — J90 Pleural effusion, not elsewhere classified: Secondary | ICD-10-CM | POA: Diagnosis not present

## 2017-09-12 DIAGNOSIS — R609 Edema, unspecified: Secondary | ICD-10-CM | POA: Diagnosis not present

## 2017-09-12 DIAGNOSIS — R059 Cough, unspecified: Secondary | ICD-10-CM

## 2017-09-12 DIAGNOSIS — Z7401 Bed confinement status: Secondary | ICD-10-CM | POA: Diagnosis not present

## 2017-09-12 DIAGNOSIS — R079 Chest pain, unspecified: Secondary | ICD-10-CM | POA: Diagnosis not present

## 2017-09-12 DIAGNOSIS — R0602 Shortness of breath: Secondary | ICD-10-CM | POA: Diagnosis not present

## 2017-09-12 DIAGNOSIS — R0600 Dyspnea, unspecified: Secondary | ICD-10-CM | POA: Diagnosis not present

## 2017-09-12 DIAGNOSIS — N183 Chronic kidney disease, stage 3 (moderate): Secondary | ICD-10-CM | POA: Insufficient documentation

## 2017-09-12 LAB — CBC WITH DIFFERENTIAL/PLATELET
Basophils Absolute: 0 10*3/uL (ref 0.0–0.1)
Basophils Relative: 0 %
Eosinophils Absolute: 0 10*3/uL (ref 0.0–0.7)
Eosinophils Relative: 0 %
HCT: 32 % — ABNORMAL LOW (ref 39.0–52.0)
Hemoglobin: 9.8 g/dL — ABNORMAL LOW (ref 13.0–17.0)
Lymphocytes Relative: 15 %
Lymphs Abs: 1.4 10*3/uL (ref 0.7–4.0)
MCH: 34.6 pg — ABNORMAL HIGH (ref 26.0–34.0)
MCHC: 30.6 g/dL (ref 30.0–36.0)
MCV: 113.1 fL — ABNORMAL HIGH (ref 78.0–100.0)
Monocytes Absolute: 0.9 10*3/uL (ref 0.1–1.0)
Monocytes Relative: 10 %
Neutro Abs: 6.8 10*3/uL (ref 1.7–7.7)
Neutrophils Relative %: 75 %
Platelets: 155 10*3/uL (ref 150–400)
RBC: 2.83 MIL/uL — ABNORMAL LOW (ref 4.22–5.81)
RDW: 19.9 % — ABNORMAL HIGH (ref 11.5–15.5)
WBC: 9.1 10*3/uL (ref 4.0–10.5)

## 2017-09-12 LAB — BASIC METABOLIC PANEL
Anion gap: 12 (ref 5–15)
BUN: 29 mg/dL — ABNORMAL HIGH (ref 8–23)
CO2: 28 mmol/L (ref 22–32)
Calcium: 9.4 mg/dL (ref 8.9–10.3)
Chloride: 102 mmol/L (ref 98–111)
Creatinine, Ser: 1.38 mg/dL — ABNORMAL HIGH (ref 0.61–1.24)
GFR calc Af Amer: 57 mL/min — ABNORMAL LOW (ref >60)
GFR calc non Af Amer: 50 mL/min — ABNORMAL LOW (ref >60)
Glucose, Bld: 96 mg/dL (ref 70–99)
Potassium: 3.8 mmol/L (ref 3.5–5.1)
Sodium: 142 mmol/L (ref 135–145)

## 2017-09-12 LAB — BRAIN NATRIURETIC PEPTIDE: B Natriuretic Peptide: 63.5 pg/mL (ref 0.0–100.0)

## 2017-09-12 LAB — I-STAT TROPONIN, ED: Troponin i, poc: 0.02 ng/mL (ref 0.00–0.08)

## 2017-09-12 MED ORDER — DOXYCYCLINE HYCLATE 100 MG PO CAPS: 100.0000 mg | ORAL_CAPSULE | Freq: Two times a day (BID) | ORAL | 0 refills | Status: DC

## 2017-09-12 MED ORDER — DOXYCYCLINE HYCLATE 100 MG PO TABS
100.0000 mg | ORAL_TABLET | Freq: Once | ORAL | Status: AC
  Administered 2017-09-12: 100 mg via ORAL
  Filled 2017-09-12: qty 1

## 2017-09-12 MED ORDER — IPRATROPIUM-ALBUTEROL 0.5-2.5 (3) MG/3ML IN SOLN
3.0000 mL | Freq: Once | RESPIRATORY_TRACT | Status: AC
  Administered 2017-09-12: 3 mL via RESPIRATORY_TRACT
  Filled 2017-09-12: qty 3

## 2017-09-12 NOTE — ED Notes (Signed)
[facility] contacted for tx back home 

## 2017-09-12 NOTE — ED Triage Notes (Signed)
Pt arrived via [facility]; per EMS, pt from urgent care w/ c/o SOB x 3 days ; pt has hx of COPD; pt has had neb tx x 1 without relief; Pt on O2 [place] 2L sating at 100; 110; 113/76; CBG 146

## 2017-09-12 NOTE — ED Provider Notes (Signed)
[facility] EMERGENCY DEPARTMENT Provider Note   CSN: [id number] Arrival date & time: 09/12/17  1152     History   Chief Complaint Chief Complaint  Patient presents with  . Chest Pain    HPI Chad Duke is a 72 y.o. male.  The history is provided by the patient, the EMS personnel and medical records. No language interpreter was used.  Chest Pain     Chad Duke is a 72 y.o. male who presents to the Emergency Department complaining of sob. He presents to the emergency department for progressive shortness of breath over the last several days. He has COPD and is on home oxygen, 2 L at baseline. He is on daily steroid therapy. He was recently admitted to the hospital for similar symptoms and states he is compliant with his medications. He reports considerable shortness of breath and dyspnea on exertion. He is a nonproductive cough. He denies any fevers, chest pain, abdominal pain. He has chronic lower extremity edema and this is overall improved from his baseline. Past Medical History:  Diagnosis Date  . AAA (abdominal aortic aneurysm) (HCC)   . Allergy    Rhinitis  . Arthritis    Rhuematoid  . Asthma   . Chronic renal insufficiency   . Diverticulosis   . Emphysema   . GERD (gastroesophageal reflux disease)   . Hearing loss of both ears   . Hiatal hernia   . Hyperlipidemia   . Hypertension   . Male hypogonadism   . Nephrolithiasis   . Nephrolithiasis   . Sleep apnea    On CPAP  . Stasis dermatitis   . Varicose veins   . Vitamin B 12 deficiency     Patient Active Problem List   Diagnosis Date Noted  . CAD (coronary artery disease) 09/08/2017  . Goals of care, counseling/discussion   . Palliative care by specialist   . Acute kidney injury superimposed on chronic kidney disease (HCC) 09/02/2017  . Hypokalemia 09/02/2017  . Hypomagnesemia 09/02/2017  . Controlled type 2 diabetes mellitus with hyperglycemia (HCC) 09/02/2017  . Urinary  incontinence 09/02/2017  . Fall 08/13/2017  . Acute renal failure superimposed on stage 3 chronic kidney disease (HCC) 08/13/2017  . HLD (hyperlipidemia) 08/13/2017  . Essential hypertension 08/13/2017  . GERD (gastroesophageal reflux disease) 08/13/2017  . Orthostatic hypotension 08/13/2017  . Deep vein thrombosis (DVT) of both lower extremities (HCC)   . Hyperglycemia   . History of pulmonary embolus (PE) 07/20/2017  . Chronic diastolic CHF (congestive heart failure) (HCC) 07/20/2017  . Bilateral leg edema 07/20/2017  . Acute exacerbation of chronic obstructive pulmonary disease (COPD) (HCC) 07/19/2017  . 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 Cigarette smoker 09/08/2016  . Morbid obesity due to excess calories (HCC) 09/08/2016  . Pain in joint, ankle and foot 03/31/2012  . Abnormality of gait 03/31/2012  . Rheumatoid arthritis (HCC) 03/31/2012  . COPD GOLD III  02/12/2011  . Sleep apnea 02/12/2011    Past Surgical History:  Procedure Laterality Date  . CARPAL TUNNEL RELEASE     bilateral  . FRACTURE SURGERY Left    Fibula  . JOINT REPLACEMENT Left    Shoulder  . JOINT REPLACEMENT Left    Hip   . KIDNEY STONE SURGERY  2003  . left hip replacement    . left knee    . left shoulder    . LITHOTRIPSY          Home Medications  Prior to Admission medications   Medication Sig Start Date End Date Taking? Authorizing Provider  albuterol (PROVENTIL HFA;VENTOLIN HFA) 108 (90 Base) MCG/ACT inhaler Inhale 2 puffs into the lungs every 4 (four) hours as needed for wheezing or shortness of breath. 07/30/17  Yes Nyoka Cowden, MD  allopurinol (ZYLOPRIM) 100 MG tablet Take 1 tablet (100 mg total) by mouth daily. 09/04/17 10/04/17 Yes Albertine Grates, MD  apixaban (ELIQUIS) 5 MG TABS tablet Take 1 tablet (5 mg total) by mouth 2 (two) times daily. 09/04/17  Yes Albertine Grates, MD  Ascorbic Acid (VITAMIN C) 1000 MG tablet Take 1,000 mg by mouth daily.   Yes [provider]    atorvastatin (LIPITOR) 40 MG tablet Take 1 tablet (40 mg total) by mouth daily. 05/18/17 09/12/17 Yes Lewayne Bunting, MD  Calcium-Magnesium-Vitamin D (CITRACAL CALCIUM+D) 600-40-500 MG-MG-UNIT TB24 Take 1 tablet by mouth daily.     Yes [provider]  cetirizine (ZYRTEC) 10 MG tablet Take 10 mg by mouth daily.     Yes [provider]  cyanocobalamin 2000 MCG tablet Take 2,500 mcg by mouth daily.    Yes [provider]  folic acid (FOLVITE) 400 MCG tablet Take 1,200 mcg by mouth daily.   Yes [provider]  guaiFENesin (MUCINEX) 600 MG 12 hr tablet Take 1,200 mg by mouth 2 (two) times daily.   Yes [provider]  ipratropium-albuterol (DUONEB) 0.5-2.5 (3) MG/3ML SOLN Take 3 mLs by nebulization every 6 (six) hours.    Yes [provider]  metFORMIN (GLUCOPHAGE) 500 MG tablet Take 1 tablet (500 mg total) by mouth 2 (two) times daily with a meal. 07/23/17 09/12/17 Yes Randel Pigg, Dorma Russell, MD  mometasone-formoterol [facility]) 200-5 MCG/ACT AERO Inhale 2 puffs into the lungs 2 (two) times daily. 07/23/17  Yes Lenox Ponds, MD  Multiple Vitamin (MULTIVITAMIN) tablet Take 1 tablet by mouth daily.     Yes [provider]  OXYGEN 2 L. BIPAP with 1lpm o2    Yes [provider]  pantoprazole (PROTONIX) 40 MG tablet Take 40 mg by mouth 2 (two) times daily.    Yes [provider]  potassium chloride (MICRO-K) 10 MEQ CR capsule Take 1 capsule (10 mEq total) by mouth daily. 09/04/17  Yes Albertine Grates, MD  predniSONE (DELTASONE) 10 MG tablet Take 1 tablet (10 mg total) by mouth 2 (two) times daily. 09/04/17  Yes Albertine Grates, MD  Respiratory Therapy Supplies (FLUTTER) DEVI Use as directed 11/04/16  Yes Nyoka Cowden, MD  sodium chloride (OCEAN) 0.65 % SOLN nasal spray Place 1 spray into both nostrils as needed for congestion.   Yes [provider]  Tiotropium Bromide Monohydrate (SPIRIVA RESPIMAT) 2.5 MCG/ACT AERS Inhale 2 puffs  into the lungs daily. 05/31/17  Yes Nyoka Cowden, MD  doxycycline (VIBRAMYCIN) 100 MG capsule Take 1 capsule (100 mg total) by mouth 2 (two) times daily. 09/12/17   Tilden Fossa, MD  InFLIXimab (REMICADE IV) Inject into the vein every 6 (six) weeks.    [provider]  metFORMIN (GLUCOPHAGE) 500 MG tablet Take 1 tablet (500 mg total) by mouth daily with breakfast. Patient not taking: Reported on 09/12/2017 09/04/17 10/04/17  Albertine Grates, MD  methotrexate (RHEUMATREX) 2.5 MG tablet Take 4 tablets (10 mg total) by mouth once a week. Please hold methotrexate for now, please follow up with Dr Dierdre Forth for further instruction regarding methotrazate. 09/04/17   Albertine Grates, MD    Family History Family  History  Problem Relation Age of Onset  . Emphysema Father   . Asthma Father   . Osteoarthritis Father   . Heart attack Mother   . Uterine cancer Mother   . Heart disease Mother   . ADD / ADHD Son   . Depression Son   . Hypertension Brother   . Heart disease Brother        Pacemaker   . Heart murmur Brother     Social History Social History   Tobacco Use  . Smoking status: Former Smoker    Packs/day: 0.10    Years: 53.00    Pack years: 5.30    Types: Cigarettes    Last attempt to quit: 08/11/2017    Years since quitting: 0.0  . Smokeless tobacco: Never Used  . Tobacco comment: 1-2 cigarettes a day   Substance Use Topics  . Alcohol use: Not Currently    Alcohol/week: 0.0 oz  . Drug use: No     Allergies   Chantix [varenicline]   Review of Systems Review of Systems  Cardiovascular: Positive for chest pain.  All other systems reviewed and are negative.    Physical Exam Updated Vital Signs BP (!) 118/95 (BP Location: Right Arm)   Pulse (!) 109   Temp 98.4 F (36.9 C) (Oral)   Resp 20   SpO2 98%   Physical Exam  Constitutional: He is oriented to person, place, and time. He appears well-developed and well-nourished.  HENT:  Head: Normocephalic and atraumatic.   Cardiovascular: Regular rhythm.  No murmur heard. tachcyardic  Pulmonary/Chest: Effort normal. No respiratory distress.  Decreased air movement bilaterally with occasional crackles in the bases  Abdominal: Soft. There is no tenderness. There is no rebound and no guarding.  Musculoskeletal: He exhibits no tenderness.  3 to 4+ pitting edema bilaterally  Neurological: He is alert and oriented to person, place, and time.  Skin: Skin is warm and dry.  Psychiatric: He has a normal mood and affect. His behavior is normal.  Nursing note and vitals reviewed.    ED Treatments / Results  Labs (all labs ordered are listed, but only abnormal results are displayed) Labs Reviewed  CBC WITH DIFFERENTIAL/PLATELET - Abnormal; Notable for the following components:      Result Value   RBC 2.83 (*)    Hemoglobin 9.8 (*)    HCT 32.0 (*)    MCV 113.1 (*)    MCH 34.6 (*)    RDW 19.9 (*)    All other components within normal limits  BASIC METABOLIC PANEL - Abnormal; Notable for the following components:   BUN 29 (*)    Creatinine, Ser 1.38 (*)    GFR calc non Af Amer 50 (*)    GFR calc Af Amer 57 (*)    All other components within normal limits  BRAIN NATRIURETIC PEPTIDE  I-STAT TROPONIN, ED    EKG EKG Interpretation  Date/Time:  Sunday September 12 2017 12:29:20 EDT Ventricular Rate:  112 PR Interval:    QRS Duration: 129 QT Interval:  340 QTC Calculation: 465 R Axis:   34 Text Interpretation:  Sinus tachycardia Right bundle branch block Baseline wander in lead(s)I, II, V1,  V3 Confirmed by Tilden Fossa [phone]) on 09/12/2017 12:36:47 PM   Radiology Dg Chest 2 View  Result Date: 09/12/2017 CLINICAL DATA:  Pt c/o of cp and sob for 3 to 4 weeks. Pt has hx of COPD, AAA, HTN. EXAM: CHEST - 2 VIEW COMPARISON:  09/02/2017  FINDINGS: The lungs are hyperinflated. Heart size is accentuated by AP technique. There is patchy infiltrate in the RIGHT UPPER lobe, consistent with early infiltrate. Trace  bilateral pleural effusions. No pulmonary edema. IMPRESSION: RIGHT UPPER lobe infiltrate. Followup PA and lateral chest X-ray is recommended in 3-4 weeks following trial of antibiotic therapy to ensure resolution and exclude underlying malignancy. Small bilateral effusions. Electronically Signed   By: Norva Pavlov M.D.   On: 09/12/2017 13:27   Ct Chest Wo Contrast  Result Date: 09/12/2017 CLINICAL DATA:  Dyspnea and shortness of breath. COPD. Right upper lobe opacity on chest radiograph. EXAM: CT CHEST WITHOUT CONTRAST TECHNIQUE: Multidetector CT imaging of the chest was performed following the standard protocol without IV contrast. COMPARISON:  Chest radiograph on 09/12/2017 and prior chest CTA on 07/20/2017 FINDINGS: Cardiovascular: No acute findings. Aortic and coronary artery atherosclerosis. Mediastinum/Nodes: No masses or pathologically enlarged lymph nodes identified on this unenhanced exam. Lungs/Pleura: New linear opacity in the posterior right upper and middle lobes is consistent with subsegmental atelectasis. No evidence of central endobronchial obstruction. No evidence of pleural effusion. A new spiculated nodular density is seen in the posterior left lower lobe with a central focus of cavitation. This measures 16 x 10 mm on image 108/5, and is consistent with a resolving pulmonary infarct or infectious or inflammatory process given its short time interval for appearance, and the left lower lobe pulmonary embolism seen on recent chest CTA. No other suspicious pulmonary nodules or masses are identified. Upper Abdomen:  Unremarkable. Musculoskeletal: No suspicious bone lesions. Left shoulder prosthesis in appropriate position. IMPRESSION: New subsegmental atelectasis in posterior right upper and middle lobes. No evidence of central endobronchial obstruction. New 1.6 cm spiculated nodule with central cavitation in the posterior left lower lobe since most recent exam on 07/20/2017. This most likely  represents a resolving pulmonary infarct given its short interval development and recent left lower lobe pulmonary embolism. Other considerations include infectious or inflammatory etiologies. Recommend continued follow-up by chest CT in 3 months. Aortic Atherosclerosis (ICD10-I70.0). Coronary artery calcification. Electronically Signed   By: Myles Rosenthal M.D.   On: 09/12/2017 15:05    Procedures Procedures (including critical care time)  Medications Ordered in ED Medications  doxycycline (VIBRA-TABS) tablet 100 mg (has no administration in time range)  ipratropium-albuterol (DUONEB) 0.5-2.5 (3) MG/3ML nebulizer solution 3 mL (3 mLs Nebulization Given 09/12/17 1208)     Initial Impression / Assessment and Plan / ED Course  I have reviewed the triage vital signs and the nursing notes.  Pertinent labs & imaging results that were available during my care of the patient were reviewed by me and considered in my medical decision making (see chart for details).     Patient with history of COPD, CKD here for evaluation of shortness of breath over the last several days. He had decreased air movement on initial examination. On repeat assessment after one duo Deb he feels improved. He does have occasional wheezing and he states he has chronic wheezing at baseline. No reports of fevers or hemoptysis. No reports of chest pain. Current presentation is not consistent with ACS, CHF, PE. Chest x-ray with possible infiltrate. CT scan was obtained to further evaluate. CT demonstrates atelectasis in the right upper lobe as well as possible resolving pulmonary infarct versus infection in the left lower lobe. Current presentation is not consistent with acute infectious process but given his imaging findings will treat for possible pneumonia with doxycycline. Patient is feeling well and at his  baseline and request to go home. He is noted to have mild tachycardia in the emergency department. On record review from  hospitalizations as well as ED visits over the last year he is persistently tachycardic in the low 100s. This appears to be at his baseline. Plan to discharge home with close outpatient follow-up and return precautions. Labs demonstrate improving renal function. Final Clinical Impressions(s) / ED Diagnoses   Final diagnoses:  Cough    ED Discharge Orders        Ordered    doxycycline (VIBRAMYCIN) 100 MG capsule  2 times daily     09/12/17 1529       Tilden Fossa, MD 09/12/17 1540

## 2017-09-12 NOTE — ED Notes (Signed)
Onset of dyspnea this am while at home - hx of COPD - recent multiple admissions for same - tachypneic at 24 BPM at this time; no retractions noted; able to speak in complete sentences; scattered wheezing auscultated bilat throughout

## 2017-09-12 NOTE — Discharge Instructions (Addendum)
Your CT scan today showed a possible pneumonia.  Get rechecked immediately if you have worsening breathing or new concerning symptoms. Your labs today are improved compared to when you were recently in the hospital. You can take your albuterol every 4-6 hours for wheezing at home.

## 2017-09-13 DIAGNOSIS — N179 Acute kidney failure, unspecified: Secondary | ICD-10-CM | POA: Diagnosis not present

## 2017-09-13 DIAGNOSIS — I130 Hypertensive heart and chronic kidney disease with heart failure and stage 1 through stage 4 chronic kidney disease, or unspecified chronic kidney disease: Secondary | ICD-10-CM | POA: Diagnosis not present

## 2017-09-14 DIAGNOSIS — N179 Acute kidney failure, unspecified: Secondary | ICD-10-CM | POA: Diagnosis not present

## 2017-09-14 DIAGNOSIS — I130 Hypertensive heart and chronic kidney disease with heart failure and stage 1 through stage 4 chronic kidney disease, or unspecified chronic kidney disease: Secondary | ICD-10-CM | POA: Diagnosis not present

## 2017-09-16 DIAGNOSIS — E099 Drug or chemical induced diabetes mellitus without complications: Secondary | ICD-10-CM | POA: Diagnosis not present

## 2017-09-16 DIAGNOSIS — T380X5A Adverse effect of glucocorticoids and synthetic analogues, initial encounter: Secondary | ICD-10-CM | POA: Diagnosis not present

## 2017-09-16 DIAGNOSIS — M79605 Pain in left leg: Secondary | ICD-10-CM | POA: Diagnosis not present

## 2017-09-16 DIAGNOSIS — R609 Edema, unspecified: Secondary | ICD-10-CM | POA: Diagnosis not present

## 2017-09-16 DIAGNOSIS — R7303 Prediabetes: Secondary | ICD-10-CM | POA: Diagnosis not present

## 2017-09-16 DIAGNOSIS — M79604 Pain in right leg: Secondary | ICD-10-CM | POA: Diagnosis not present

## 2017-09-16 DIAGNOSIS — R0989 Other specified symptoms and signs involving the circulatory and respiratory systems: Secondary | ICD-10-CM | POA: Diagnosis not present

## 2017-09-16 DIAGNOSIS — R0602 Shortness of breath: Secondary | ICD-10-CM | POA: Diagnosis not present

## 2017-09-17 DIAGNOSIS — N179 Acute kidney failure, unspecified: Secondary | ICD-10-CM | POA: Diagnosis not present

## 2017-09-17 DIAGNOSIS — I130 Hypertensive heart and chronic kidney disease with heart failure and stage 1 through stage 4 chronic kidney disease, or unspecified chronic kidney disease: Secondary | ICD-10-CM | POA: Diagnosis not present

## 2017-09-20 DIAGNOSIS — N179 Acute kidney failure, unspecified: Secondary | ICD-10-CM | POA: Diagnosis not present

## 2017-09-20 DIAGNOSIS — I130 Hypertensive heart and chronic kidney disease with heart failure and stage 1 through stage 4 chronic kidney disease, or unspecified chronic kidney disease: Secondary | ICD-10-CM | POA: Diagnosis not present

## 2017-09-23 DIAGNOSIS — N179 Acute kidney failure, unspecified: Secondary | ICD-10-CM | POA: Diagnosis not present

## 2017-09-23 DIAGNOSIS — I130 Hypertensive heart and chronic kidney disease with heart failure and stage 1 through stage 4 chronic kidney disease, or unspecified chronic kidney disease: Secondary | ICD-10-CM | POA: Diagnosis not present

## 2017-09-24 DIAGNOSIS — I130 Hypertensive heart and chronic kidney disease with heart failure and stage 1 through stage 4 chronic kidney disease, or unspecified chronic kidney disease: Secondary | ICD-10-CM | POA: Diagnosis not present

## 2017-09-24 DIAGNOSIS — N179 Acute kidney failure, unspecified: Secondary | ICD-10-CM | POA: Diagnosis not present

## 2017-09-25 DIAGNOSIS — R0602 Shortness of breath: Secondary | ICD-10-CM | POA: Diagnosis not present

## 2017-09-25 DIAGNOSIS — R0902 Hypoxemia: Secondary | ICD-10-CM | POA: Diagnosis not present

## 2017-09-25 DIAGNOSIS — R069 Unspecified abnormalities of breathing: Secondary | ICD-10-CM | POA: Diagnosis not present

## 2017-09-25 DIAGNOSIS — I4891 Unspecified atrial fibrillation: Secondary | ICD-10-CM | POA: Diagnosis not present

## 2017-09-25 DIAGNOSIS — J80 Acute respiratory distress syndrome: Secondary | ICD-10-CM | POA: Diagnosis not present

## 2017-09-26 ENCOUNTER — Emergency Department (HOSPITAL_COMMUNITY): Payer: Medicare Other

## 2017-09-26 ENCOUNTER — Inpatient Hospital Stay (HOSPITAL_COMMUNITY): Payer: Medicare Other

## 2017-09-26 ENCOUNTER — Inpatient Hospital Stay (HOSPITAL_COMMUNITY)
Admission: EM | Admit: 2017-09-26 | Discharge: 2017-09-29 | DRG: 190 | Disposition: A | Discharge status: Home Health | Payer: Medicare Other | Attending: Family Medicine | Admitting: Family Medicine

## 2017-09-26 ENCOUNTER — Other Ambulatory Visit: Payer: Self-pay

## 2017-09-26 ENCOUNTER — Encounter (HOSPITAL_COMMUNITY): Payer: Self-pay | Admitting: Family Medicine

## 2017-09-26 DIAGNOSIS — M109 Gout, unspecified: Secondary | ICD-10-CM | POA: Diagnosis present

## 2017-09-26 DIAGNOSIS — E871 Hypo-osmolality and hyponatremia: Secondary | ICD-10-CM | POA: Diagnosis present

## 2017-09-26 DIAGNOSIS — I5032 Chronic diastolic (congestive) heart failure: Secondary | ICD-10-CM | POA: Diagnosis not present

## 2017-09-26 DIAGNOSIS — E876 Hypokalemia: Secondary | ICD-10-CM | POA: Diagnosis not present

## 2017-09-26 DIAGNOSIS — Z9989 Dependence on other enabling machines and devices: Secondary | ICD-10-CM | POA: Diagnosis not present

## 2017-09-26 DIAGNOSIS — Z87891 Personal history of nicotine dependence: Secondary | ICD-10-CM

## 2017-09-26 DIAGNOSIS — E860 Dehydration: Secondary | ICD-10-CM | POA: Diagnosis present

## 2017-09-26 DIAGNOSIS — Z7901 Long term (current) use of anticoagulants: Secondary | ICD-10-CM

## 2017-09-26 DIAGNOSIS — Z96612 Presence of left artificial shoulder joint: Secondary | ICD-10-CM | POA: Diagnosis present

## 2017-09-26 DIAGNOSIS — Z7952 Long term (current) use of systemic steroids: Secondary | ICD-10-CM

## 2017-09-26 DIAGNOSIS — N179 Acute kidney failure, unspecified: Secondary | ICD-10-CM | POA: Diagnosis not present

## 2017-09-26 DIAGNOSIS — I2510 Atherosclerotic heart disease of native coronary artery without angina pectoris: Secondary | ICD-10-CM | POA: Diagnosis present

## 2017-09-26 DIAGNOSIS — E1165 Type 2 diabetes mellitus with hyperglycemia: Secondary | ICD-10-CM | POA: Diagnosis not present

## 2017-09-26 DIAGNOSIS — G4733 Obstructive sleep apnea (adult) (pediatric): Secondary | ICD-10-CM | POA: Diagnosis present

## 2017-09-26 DIAGNOSIS — N183 Chronic kidney disease, stage 3 unspecified: Secondary | ICD-10-CM | POA: Diagnosis present

## 2017-09-26 DIAGNOSIS — Z79899 Other long term (current) drug therapy: Secondary | ICD-10-CM

## 2017-09-26 DIAGNOSIS — R748 Abnormal levels of other serum enzymes: Secondary | ICD-10-CM | POA: Diagnosis not present

## 2017-09-26 DIAGNOSIS — T380X5A Adverse effect of glucocorticoids and synthetic analogues, initial encounter: Secondary | ICD-10-CM | POA: Diagnosis present

## 2017-09-26 DIAGNOSIS — Z86711 Personal history of pulmonary embolism: Secondary | ICD-10-CM | POA: Diagnosis present

## 2017-09-26 DIAGNOSIS — D539 Nutritional anemia, unspecified: Secondary | ICD-10-CM | POA: Diagnosis present

## 2017-09-26 DIAGNOSIS — R0602 Shortness of breath: Secondary | ICD-10-CM

## 2017-09-26 DIAGNOSIS — J441 Chronic obstructive pulmonary disease with (acute) exacerbation: Principal | ICD-10-CM

## 2017-09-26 DIAGNOSIS — I130 Hypertensive heart and chronic kidney disease with heart failure and stage 1 through stage 4 chronic kidney disease, or unspecified chronic kidney disease: Secondary | ICD-10-CM | POA: Diagnosis present

## 2017-09-26 DIAGNOSIS — Z66 Do not resuscitate: Secondary | ICD-10-CM | POA: Diagnosis present

## 2017-09-26 DIAGNOSIS — Z888 Allergy status to other drugs, medicaments and biological substances status: Secondary | ICD-10-CM

## 2017-09-26 DIAGNOSIS — I714 Abdominal aortic aneurysm, without rupture: Secondary | ICD-10-CM | POA: Diagnosis present

## 2017-09-26 DIAGNOSIS — E861 Hypovolemia: Secondary | ICD-10-CM | POA: Diagnosis present

## 2017-09-26 DIAGNOSIS — I82533 Chronic embolism and thrombosis of popliteal vein, bilateral: Secondary | ICD-10-CM | POA: Diagnosis present

## 2017-09-26 DIAGNOSIS — I2699 Other pulmonary embolism without acute cor pulmonale: Secondary | ICD-10-CM | POA: Diagnosis not present

## 2017-09-26 DIAGNOSIS — R778 Other specified abnormalities of plasma proteins: Secondary | ICD-10-CM | POA: Diagnosis present

## 2017-09-26 DIAGNOSIS — R7989 Other specified abnormal findings of blood chemistry: Secondary | ICD-10-CM | POA: Diagnosis present

## 2017-09-26 DIAGNOSIS — Z7951 Long term (current) use of inhaled steroids: Secondary | ICD-10-CM

## 2017-09-26 DIAGNOSIS — Z86718 Personal history of other venous thrombosis and embolism: Secondary | ICD-10-CM | POA: Diagnosis not present

## 2017-09-26 DIAGNOSIS — M069 Rheumatoid arthritis, unspecified: Secondary | ICD-10-CM | POA: Diagnosis not present

## 2017-09-26 DIAGNOSIS — J9611 Chronic respiratory failure with hypoxia: Secondary | ICD-10-CM | POA: Diagnosis present

## 2017-09-26 DIAGNOSIS — M7989 Other specified soft tissue disorders: Secondary | ICD-10-CM | POA: Diagnosis not present

## 2017-09-26 DIAGNOSIS — Z96642 Presence of left artificial hip joint: Secondary | ICD-10-CM | POA: Diagnosis present

## 2017-09-26 DIAGNOSIS — Z7984 Long term (current) use of oral hypoglycemic drugs: Secondary | ICD-10-CM

## 2017-09-26 DIAGNOSIS — I5033 Acute on chronic diastolic (congestive) heart failure: Secondary | ICD-10-CM | POA: Diagnosis present

## 2017-09-26 DIAGNOSIS — Z9981 Dependence on supplemental oxygen: Secondary | ICD-10-CM

## 2017-09-26 LAB — GLUCOSE, CAPILLARY
Glucose-Capillary: 330 mg/dL — ABNORMAL HIGH (ref 70–99)
Glucose-Capillary: 294 mg/dL — ABNORMAL HIGH (ref 70–99)
Glucose-Capillary: 392 mg/dL — ABNORMAL HIGH (ref 70–99)

## 2017-09-26 LAB — APTT: aPTT: >200 seconds (ref 24–36)

## 2017-09-26 LAB — CBC WITH DIFFERENTIAL/PLATELET
Abs Immature Granulocytes: 0.3 10*3/uL — ABNORMAL HIGH (ref 0.0–0.1)
Abs Immature Granulocytes: 0.1 10*3/uL (ref 0.0–0.1)
Basophils Absolute: 0.1 10*3/uL (ref 0.0–0.1)
Basophils Absolute: 0 10*3/uL (ref 0.0–0.1)
Basophils Relative: 0 %
Basophils Relative: 0 %
Eosinophils Absolute: 0 10*3/uL (ref 0.0–0.7)
Eosinophils Absolute: 0 10*3/uL (ref 0.0–0.7)
Eosinophils Relative: 0 %
Eosinophils Relative: 0 %
HCT: 37.8 % — ABNORMAL LOW (ref 39.0–52.0)
HCT: 34.7 % — ABNORMAL LOW (ref 39.0–52.0)
Hemoglobin: 12.5 g/dL — ABNORMAL LOW (ref 13.0–17.0)
Hemoglobin: 11.3 g/dL — ABNORMAL LOW (ref 13.0–17.0)
Immature Granulocytes: 2 %
Immature Granulocytes: 1 %
Lymphocytes Relative: 26 %
Lymphocytes Relative: 5 %
Lymphs Abs: 3.5 10*3/uL (ref 0.7–4.0)
Lymphs Abs: 0.6 10*3/uL — ABNORMAL LOW (ref 0.7–4.0)
MCH: 34.7 pg — ABNORMAL HIGH (ref 26.0–34.0)
MCH: 34.1 pg — ABNORMAL HIGH (ref 26.0–34.0)
MCHC: 33.1 g/dL (ref 30.0–36.0)
MCHC: 32.6 g/dL (ref 30.0–36.0)
MCV: 105 fL — ABNORMAL HIGH (ref 78.0–100.0)
MCV: 104.8 fL — ABNORMAL HIGH (ref 78.0–100.0)
Monocytes Absolute: 1.2 10*3/uL — ABNORMAL HIGH (ref 0.1–1.0)
Monocytes Absolute: 0.2 10*3/uL (ref 0.1–1.0)
Monocytes Relative: 9 %
Monocytes Relative: 2 %
Neutro Abs: 8.6 10*3/uL — ABNORMAL HIGH (ref 1.7–7.7)
Neutro Abs: 10.2 10*3/uL — ABNORMAL HIGH (ref 1.7–7.7)
Neutrophils Relative %: 63 %
Neutrophils Relative %: 92 %
Platelets: 230 10*3/uL (ref 150–400)
Platelets: 184 10*3/uL (ref 150–400)
RBC: 3.6 MIL/uL — ABNORMAL LOW (ref 4.22–5.81)
RBC: 3.31 MIL/uL — ABNORMAL LOW (ref 4.22–5.81)
RDW: 16.9 % — ABNORMAL HIGH (ref 11.5–15.5)
RDW: 16.6 % — ABNORMAL HIGH (ref 11.5–15.5)
WBC: 13.7 10*3/uL — ABNORMAL HIGH (ref 4.0–10.5)
WBC: 11.2 10*3/uL — ABNORMAL HIGH (ref 4.0–10.5)

## 2017-09-26 LAB — BASIC METABOLIC PANEL
Anion gap: 21 — ABNORMAL HIGH (ref 5–15)
Anion gap: 18 — ABNORMAL HIGH (ref 5–15)
BUN: 105 mg/dL — ABNORMAL HIGH (ref 8–23)
BUN: 101 mg/dL — ABNORMAL HIGH (ref 8–23)
CO2: 31 mmol/L (ref 22–32)
CO2: 31 mmol/L (ref 22–32)
Calcium: 9.5 mg/dL (ref 8.9–10.3)
Calcium: 9.1 mg/dL (ref 8.9–10.3)
Chloride: 81 mmol/L — ABNORMAL LOW (ref 98–111)
Chloride: 83 mmol/L — ABNORMAL LOW (ref 98–111)
Creatinine, Ser: 2.5 mg/dL — ABNORMAL HIGH (ref 0.61–1.24)
Creatinine, Ser: 2.25 mg/dL — ABNORMAL HIGH (ref 0.61–1.24)
GFR calc Af Amer: 28 mL/min — ABNORMAL LOW (ref >60)
GFR calc Af Amer: 32 mL/min — ABNORMAL LOW (ref >60)
GFR calc non Af Amer: 24 mL/min — ABNORMAL LOW (ref >60)
GFR calc non Af Amer: 27 mL/min — ABNORMAL LOW (ref >60)
Glucose, Bld: 289 mg/dL — ABNORMAL HIGH (ref 70–99)
Glucose, Bld: 326 mg/dL — ABNORMAL HIGH (ref 70–99)
Potassium: <2 mmol/L — CL (ref 3.5–5.1)
Potassium: 2.2 mmol/L — CL (ref 3.5–5.1)
Sodium: 133 mmol/L — ABNORMAL LOW (ref 135–145)
Sodium: 132 mmol/L — ABNORMAL LOW (ref 135–145)

## 2017-09-26 LAB — CBG MONITORING, ED: Glucose-Capillary: 333 mg/dL — ABNORMAL HIGH (ref 70–99)

## 2017-09-26 LAB — I-STAT TROPONIN, ED: Troponin i, poc: 0.11 ng/mL (ref 0.00–0.08)

## 2017-09-26 LAB — HEPARIN LEVEL (UNFRACTIONATED): Heparin Unfractionated: >2.2 IU/mL — ABNORMAL HIGH (ref 0.30–0.70)

## 2017-09-26 LAB — MAGNESIUM: Magnesium: 2.1 mg/dL (ref 1.7–2.4)

## 2017-09-26 LAB — TROPONIN I
Troponin I: 0.09 ng/mL (ref <0.03)
Troponin I: 0.09 ng/mL (ref <0.03)

## 2017-09-26 LAB — POTASSIUM: Potassium: 2.1 mmol/L — CL (ref 3.5–5.1)

## 2017-09-26 LAB — D-DIMER, QUANTITATIVE: D-Dimer, Quant: 1.06 ug/mL-FEU — ABNORMAL HIGH (ref 0.00–0.50)

## 2017-09-26 LAB — CREATININE, URINE, RANDOM: Creatinine, Urine: 51.26 mg/dL

## 2017-09-26 LAB — BRAIN NATRIURETIC PEPTIDE: B Natriuretic Peptide: 98.2 pg/mL (ref 0.0–100.0)

## 2017-09-26 LAB — SODIUM, URINE, RANDOM: Sodium, Ur: 54 mmol/L

## 2017-09-26 MED ORDER — METHYLPREDNISOLONE SODIUM SUCC 125 MG IJ SOLR
60.0000 mg | Freq: Four times a day (QID) | INTRAMUSCULAR | Status: DC
  Administered 2017-09-26 – 2017-09-27 (×6): 60 mg via INTRAVENOUS
  Filled 2017-09-26 (×6): qty 2

## 2017-09-26 MED ORDER — ONDANSETRON HCL 4 MG PO TABS: 4.0000 mg | ORAL_TABLET | Freq: Four times a day (QID) | ORAL | Status: DC | PRN

## 2017-09-26 MED ORDER — TIOTROPIUM BROMIDE MONOHYDRATE 2.5 MCG/ACT IN AERS: 2.0000 | INHALATION_SPRAY | Freq: Every day | RESPIRATORY_TRACT | Status: DC

## 2017-09-26 MED ORDER — TECHNETIUM TC 99M DIETHYLENETRIAME-PENTAACETIC ACID
32.5000 | Freq: Once | INTRAVENOUS | Status: AC | PRN
  Administered 2017-09-26: 32.5 via RESPIRATORY_TRACT

## 2017-09-26 MED ORDER — HEPARIN BOLUS VIA INFUSION
5000.0000 [IU] | Freq: Once | INTRAVENOUS | Status: DC
  Filled 2017-09-26: qty 5000

## 2017-09-26 MED ORDER — SENNOSIDES-DOCUSATE SODIUM 8.6-50 MG PO TABS: 1.0000 | ORAL_TABLET | Freq: Every evening | ORAL | Status: DC | PRN

## 2017-09-26 MED ORDER — SODIUM CHLORIDE 0.9 % IV SOLN
500.0000 mg | Freq: Every day | INTRAVENOUS | Status: DC
  Administered 2017-09-26: 500 mg via INTRAVENOUS
  Filled 2017-09-26: qty 500

## 2017-09-26 MED ORDER — POTASSIUM CHLORIDE IN NACL 40-0.9 MEQ/L-% IV SOLN
INTRAVENOUS | Status: AC
  Administered 2017-09-26 (×2): 75 mL/h via INTRAVENOUS
  Filled 2017-09-26: qty 1000

## 2017-09-26 MED ORDER — IPRATROPIUM-ALBUTEROL 0.5-2.5 (3) MG/3ML IN SOLN
3.0000 mL | RESPIRATORY_TRACT | Status: AC
  Administered 2017-09-26 (×3): 3 mL via RESPIRATORY_TRACT
  Filled 2017-09-26: qty 3
  Filled 2017-09-26: qty 6

## 2017-09-26 MED ORDER — GUAIFENESIN ER 600 MG PO TB12
1200.0000 mg | ORAL_TABLET | Freq: Two times a day (BID) | ORAL | Status: DC
  Administered 2017-09-26 – 2017-09-29 (×7): 1200 mg via ORAL
  Filled 2017-09-26 (×7): qty 2

## 2017-09-26 MED ORDER — VITAMIN B-12 1000 MCG PO TABS
2500.0000 ug | ORAL_TABLET | Freq: Every day | ORAL | Status: DC
  Administered 2017-09-26 – 2017-09-29 (×4): 2500 ug via ORAL
  Filled 2017-09-26 (×4): qty 3
  Filled 2017-09-26: qty 2.5

## 2017-09-26 MED ORDER — SALINE SPRAY 0.65 % NA SOLN: 1.0000 | NASAL | Status: DC | PRN

## 2017-09-26 MED ORDER — POTASSIUM CHLORIDE 10 MEQ/100ML IV SOLN
10.0000 meq | INTRAVENOUS | Status: AC
  Administered 2017-09-26 (×3): 10 meq via INTRAVENOUS
  Filled 2017-09-26 (×4): qty 100

## 2017-09-26 MED ORDER — IPRATROPIUM-ALBUTEROL 0.5-2.5 (3) MG/3ML IN SOLN
3.0000 mL | Freq: Three times a day (TID) | RESPIRATORY_TRACT | Status: DC
  Administered 2017-09-27 – 2017-09-29 (×7): 3 mL via RESPIRATORY_TRACT
  Filled 2017-09-26 (×7): qty 3

## 2017-09-26 MED ORDER — IPRATROPIUM-ALBUTEROL 0.5-2.5 (3) MG/3ML IN SOLN
3.0000 mL | Freq: Four times a day (QID) | RESPIRATORY_TRACT | Status: DC
  Administered 2017-09-26 (×2): 3 mL via RESPIRATORY_TRACT
  Filled 2017-09-26 (×2): qty 3

## 2017-09-26 MED ORDER — HYDROCODONE-ACETAMINOPHEN 5-325 MG PO TABS: 1.0000 | ORAL_TABLET | ORAL | Status: DC | PRN

## 2017-09-26 MED ORDER — POTASSIUM CHLORIDE CRYS ER 20 MEQ PO TBCR
40.0000 meq | EXTENDED_RELEASE_TABLET | Freq: Two times a day (BID) | ORAL | Status: AC
  Administered 2017-09-26 (×3): 40 meq via ORAL
  Filled 2017-09-26 (×2): qty 2

## 2017-09-26 MED ORDER — ALBUTEROL SULFATE (2.5 MG/3ML) 0.083% IN NEBU
2.5000 mg | INHALATION_SOLUTION | RESPIRATORY_TRACT | Status: DC | PRN
Start: 2017-09-26 — End: 2017-09-29

## 2017-09-26 MED ORDER — FOLIC ACID 1 MG PO TABS
1.0000 mg | ORAL_TABLET | Freq: Every day | ORAL | Status: DC
  Administered 2017-09-26 – 2017-09-29 (×4): 1 mg via ORAL
  Filled 2017-09-26 (×4): qty 1

## 2017-09-26 MED ORDER — ACETAMINOPHEN 650 MG RE SUPP: 650.0000 mg | Freq: Four times a day (QID) | RECTAL | Status: DC | PRN

## 2017-09-26 MED ORDER — SODIUM CHLORIDE 0.9% FLUSH
3.0000 mL | Freq: Two times a day (BID) | INTRAVENOUS | Status: DC
  Administered 2017-09-26 – 2017-09-29 (×7): 3 mL via INTRAVENOUS

## 2017-09-26 MED ORDER — ONDANSETRON HCL 4 MG/2ML IJ SOLN: 4.0000 mg | Freq: Four times a day (QID) | INTRAMUSCULAR | Status: DC | PRN

## 2017-09-26 MED ORDER — INSULIN ASPART 100 UNIT/ML ~~LOC~~ SOLN
0.0000 [IU] | Freq: Every day | SUBCUTANEOUS | Status: DC
  Administered 2017-09-26: 5 [IU] via SUBCUTANEOUS

## 2017-09-26 MED ORDER — POTASSIUM CHLORIDE CRYS ER 20 MEQ PO TBCR
40.0000 meq | EXTENDED_RELEASE_TABLET | Freq: Once | ORAL | Status: AC
  Administered 2017-09-26: 40 meq via ORAL
  Filled 2017-09-26: qty 4

## 2017-09-26 MED ORDER — MAGNESIUM OXIDE 400 (241.3 MG) MG PO TABS
800.0000 mg | ORAL_TABLET | Freq: Once | ORAL | Status: AC
  Administered 2017-09-26: 800 mg via ORAL
  Filled 2017-09-26: qty 2

## 2017-09-26 MED ORDER — INSULIN ASPART 100 UNIT/ML ~~LOC~~ SOLN
0.0000 [IU] | Freq: Three times a day (TID) | SUBCUTANEOUS | Status: DC
  Administered 2017-09-26 (×2): 5 [IU] via SUBCUTANEOUS
  Administered 2017-09-26: 7 [IU] via SUBCUTANEOUS

## 2017-09-26 MED ORDER — ADULT MULTIVITAMIN W/MINERALS CH
1.0000 | ORAL_TABLET | Freq: Every day | ORAL | Status: DC
  Administered 2017-09-26 – 2017-09-29 (×4): 1 via ORAL
  Filled 2017-09-26 (×4): qty 1

## 2017-09-26 MED ORDER — HEPARIN (PORCINE) IN NACL 100-0.45 UNIT/ML-% IJ SOLN
1500.0000 [IU]/h | INTRAMUSCULAR | Status: DC
  Filled 2017-09-26: qty 250

## 2017-09-26 MED ORDER — TECHNETIUM TO 99M ALBUMIN AGGREGATED: 4.3000 | Freq: Once | INTRAVENOUS | Status: DC | PRN

## 2017-09-26 MED ORDER — SODIUM CHLORIDE 0.9 % IV BOLUS
500.0000 mL | Freq: Once | INTRAVENOUS | Status: AC
  Administered 2017-09-26: 500 mL via INTRAVENOUS

## 2017-09-26 MED ORDER — HEPARIN (PORCINE) IN NACL 100-0.45 UNIT/ML-% IJ SOLN
1150.0000 [IU]/h | INTRAMUSCULAR | Status: DC
  Administered 2017-09-26: 1500 [IU]/h via INTRAVENOUS
  Filled 2017-09-26: qty 250

## 2017-09-26 MED ORDER — POTASSIUM CHLORIDE CRYS ER 20 MEQ PO TBCR: 20.0000 meq | EXTENDED_RELEASE_TABLET | Freq: Once | ORAL | Status: DC

## 2017-09-26 MED ORDER — ACETAMINOPHEN 325 MG PO TABS: 650.0000 mg | ORAL_TABLET | Freq: Four times a day (QID) | ORAL | Status: DC | PRN

## 2017-09-26 MED ORDER — DOXYCYCLINE HYCLATE 100 MG PO TABS
100.0000 mg | ORAL_TABLET | Freq: Two times a day (BID) | ORAL | Status: DC
  Administered 2017-09-26 – 2017-09-29 (×6): 100 mg via ORAL
  Filled 2017-09-26 (×6): qty 1

## 2017-09-26 MED ORDER — ALLOPURINOL 100 MG PO TABS
100.0000 mg | ORAL_TABLET | Freq: Every day | ORAL | Status: DC
  Administered 2017-09-26 – 2017-09-29 (×4): 100 mg via ORAL
  Filled 2017-09-26 (×5): qty 1

## 2017-09-26 MED ORDER — PANTOPRAZOLE SODIUM 40 MG PO TBEC
40.0000 mg | DELAYED_RELEASE_TABLET | Freq: Two times a day (BID) | ORAL | Status: DC
  Administered 2017-09-26 – 2017-09-29 (×7): 40 mg via ORAL
  Filled 2017-09-26 (×7): qty 1

## 2017-09-26 MED ORDER — APIXABAN 5 MG PO TABS
5.0000 mg | ORAL_TABLET | Freq: Two times a day (BID) | ORAL | Status: DC
  Administered 2017-09-26 – 2017-09-29 (×6): 5 mg via ORAL
  Filled 2017-09-26 (×7): qty 1

## 2017-09-26 MED ORDER — CALCIUM CARBONATE-VITAMIN D 500-200 MG-UNIT PO TABS
1.0000 | ORAL_TABLET | Freq: Every day | ORAL | Status: DC
  Administered 2017-09-26 – 2017-09-29 (×4): 1 via ORAL
  Filled 2017-09-26 (×5): qty 1

## 2017-09-26 MED ORDER — ATORVASTATIN CALCIUM 40 MG PO TABS
40.0000 mg | ORAL_TABLET | Freq: Every day | ORAL | Status: DC
  Administered 2017-09-26 – 2017-09-28 (×3): 40 mg via ORAL
  Filled 2017-09-26 (×3): qty 1

## 2017-09-26 MED ORDER — MOMETASONE FURO-FORMOTEROL FUM 200-5 MCG/ACT IN AERO
2.0000 | INHALATION_SPRAY | Freq: Two times a day (BID) | RESPIRATORY_TRACT | Status: DC
  Administered 2017-09-26 – 2017-09-29 (×6): 2 via RESPIRATORY_TRACT
  Filled 2017-09-26: qty 8.8

## 2017-09-26 NOTE — ED Notes (Signed)
Dr Floyd informed of troponin results .11 

## 2017-09-26 NOTE — ED Triage Notes (Signed)
Pt BIB [facility] for shortness of breath for a day. Normally on O2. Pt was given 125mg  solumedrol, 2mg  mag, 10mg  of albuterol, and 1mg  atrovent. Pt said that after the duoneb he felt worse

## 2017-09-26 NOTE — ED Notes (Signed)
Date and time results received: 09/26/17 2:32 AM (use smartphrase ".now" to insert current time)  Test: Potassium Critical Value: < 2.0  Name of Provider Notified: Melene Plan, DO  Orders Received? Or Actions Taken?:

## 2017-09-26 NOTE — ED Triage Notes (Signed)
#  4 bag of potassium hanging

## 2017-09-26 NOTE — Progress Notes (Signed)
ANTICOAGULATION CONSULT NOTE - Initial Consult  Pharmacy Consult for Heparin  Indication: PE, concern for apixaban failure  Allergies  Allergen Reactions  . Chantix [Varenicline] Other (See Comments)    Suicidal thoughts   Patient Measurements: Height: 5\' 7"  (170.2 cm) Weight: 228 lb 15.9 oz (103.9 kg) IBW/kg (Calculated) : 66.1  Vital Signs: BP: 109/79 (08/18 0145) Pulse Rate: 116 (08/18 0244)  Labs: Recent Labs    09/26/17 0100  HGB 12.5*  HCT 37.8*  PLT 230  CREATININE 2.50*    Estimated Creatinine Clearance: 30.7 mL/min (A) (by C-G formula based on SCr of 2.5 mg/dL (H)).   Medical History: Past Medical History:  Diagnosis Date  . AAA (abdominal aortic aneurysm) (HCC)   . Allergy    Rhinitis  . Arthritis    Rhuematoid  . Asthma   . Chronic renal insufficiency   . Diverticulosis   . Emphysema   . GERD (gastroesophageal reflux disease)   . Hearing loss of both ears   . Hiatal hernia   . Hyperlipidemia   . Hypertension   . Male hypogonadism   . Nephrolithiasis   . Nephrolithiasis   . Sleep apnea    On CPAP  . Stasis dermatitis   . Varicose veins   . Vitamin B 12 deficiency    Assessment: 72 y/o M who has been on Apixaban since June 2019 for DVT and PE. Since then he has continued to have ongoing/worsening shortness of breath. States good apixaban compliance with last dose about 5 hours ago.   Discussed recent apixaban dose with Dr July 2019 and due to his concern for worsening PE will start heparin now, but will not bolus with recent apixaban.   Will likely be using aPTT to dose heparin for now due to apixaban influence on anti-Xa levels. Hgb 12.5. Plts good. Noted renal dysfunction.   Goal of Therapy:  Heparin level 0.3-0.7 units/ml aPTT 66-102 seconds Monitor platelets by anticoagulation protocol: Yes   Plan:  -Start heparin drip at 1500 units/hr -1100 aPTT/HL -Daily CBC/aPTT/HL -Monitor for bleeding -F/U imaging (?VQ) to assess if apixaban  failure is present  Adela Lank 09/26/2017,2:52 AM

## 2017-09-26 NOTE — H&P (Signed)
History and Physical    Chad Duke QQP:[id number] DOB: 1946/01/22 DOA: 09/26/2017  PCP: Chad Real, MD   Patient coming from: Home   Chief Complaint: SOB   HPI: Chad Duke is a 72 y.o. male with medical history significant for rheumatoid arthritis, chronic kidney disease stage III, COPD with chronic hypoxic respiratory failure, chronic diastolic CHF, history of DVT and PE on Eliquis, and coronary artery disease, now presenting to the emergency department for evaluation of increased shortness of breath.  Patient reports that he had improved during the recent hospitalization, but has become progressively more dyspneic since discharge, particularly worse over the past 3 to 4 days.  He has not noted much change in his chronic cough or sputum production, denies any chest pain or palpitations, and denies fevers or chills.  He reports that his lower extremity swelling has improved and he has lost 45 pounds in the last 2 months.  He was on prednisone and doxycycline earlier this month.  Reports that he has been taking Lasix 40 mg twice daily since hospital discharge.  Patient called EMS and was treated with 125 mg IV Solu-Medrol and nebs prior to arrival in the ED. Marland Kitchen ED Course: Upon arrival to the ED, patient is found to be slightly tachycardic, slightly tachypneic, saturating adequately on 2 L/min supplemental oxygen, and with stable blood pressure.  EKG features a sinus or ectopic atrial rhythm with PAC, RBBB, LP FB, and QTc interval 517 ms.  Chest x-ray is notable for mild cardiac enlargement, but no acute cardiopulmonary disease.  Chemistry panel features a sodium of 133, potassium <2.0, glucose 289, BUN 105, and creatinine 2.50, up from 1.38 earlier this month.  CBC is notable for leukocytosis to 13,700 and an improved microcytic anemia with hemoglobin of 12.5.  Troponin is elevated to 0.11, BNP is normal, and d-dimer is elevated to 1.06.  ED physician conferred with pharmacy regarding the elevated  d-dimer and her current anticoagulation with Eliquis; decision was made to start the patient on IV heparin infusion.  CTA was not performed due to renal insufficiency.  Patient was given 40 mEq IV potassium, 40 mEq oral potassium, 500 cc normal saline, 800 mg oral magnesium, and started on heparin infusion.  He reports subjective improvement in his breathing, blood pressure remains stable, and he will be admitted for ongoing evaluation and management.  Review of Systems:  All other systems reviewed and apart from HPI, are negative.  Past Medical History:  Diagnosis Date  . AAA (abdominal aortic aneurysm) (HCC)   . Allergy    Rhinitis  . Arthritis    Rhuematoid  . Asthma   . Chronic renal insufficiency   . Diverticulosis   . Emphysema   . GERD (gastroesophageal reflux disease)   . Hearing loss of both ears   . Hiatal hernia   . Hyperlipidemia   . Hypertension   . Male hypogonadism   . Nephrolithiasis   . Nephrolithiasis   . Sleep apnea    On CPAP  . Stasis dermatitis   . Varicose veins   . Vitamin B 12 deficiency     Past Surgical History:  Procedure Laterality Date  . CARPAL TUNNEL RELEASE     bilateral  . FRACTURE SURGERY Left    Fibula  . JOINT REPLACEMENT Left    Shoulder  . JOINT REPLACEMENT Left    Hip   . KIDNEY STONE SURGERY  2003  . left hip replacement    . left knee    .  left shoulder    . LITHOTRIPSY       reports that he quit smoking about 6 weeks ago. His smoking use included cigarettes. He has a 5.30 pack-year smoking history. He has never used smokeless tobacco. He reports that he drank alcohol. He reports that he does not use drugs.  Allergies  Allergen Reactions  . Chantix [Varenicline] Other (See Comments)    Suicidal thoughts    Family History  Problem Relation Age of Onset  . Emphysema Father   . Asthma Father   . Osteoarthritis Father   . Heart attack Mother   . Uterine cancer Mother   . Heart disease Mother   . ADD / ADHD Son   .  Depression Son   . Hypertension Brother   . Heart disease Brother        Pacemaker   . Heart murmur Brother      Prior to Admission medications   Medication Sig Start Date End Date Taking? Authorizing Provider  albuterol (PROVENTIL HFA;VENTOLIN HFA) 108 (90 Base) MCG/ACT inhaler Inhale 2 puffs into the lungs every 4 (four) hours as needed for wheezing or shortness of breath. 07/30/17   Nyoka Cowden, MD  allopurinol (ZYLOPRIM) 100 MG tablet Take 1 tablet (100 mg total) by mouth daily. 09/04/17 10/04/17  Albertine Grates, MD  apixaban (ELIQUIS) 5 MG TABS tablet Take 1 tablet (5 mg total) by mouth 2 (two) times daily. 09/04/17   Albertine Grates, MD  Ascorbic Acid (VITAMIN C) 1000 MG tablet Take 1,000 mg by mouth daily.    [provider]  atorvastatin (LIPITOR) 40 MG tablet Take 1 tablet (40 mg total) by mouth daily. 05/18/17 09/12/17  Lewayne Bunting, MD  Calcium-Magnesium-Vitamin D (CITRACAL CALCIUM+D) 600-40-500 MG-MG-UNIT TB24 Take 1 tablet by mouth daily.      [provider]  cetirizine (ZYRTEC) 10 MG tablet Take 10 mg by mouth daily.      [provider]  cyanocobalamin 2000 MCG tablet Take 2,500 mcg by mouth daily.     [provider]  folic acid (FOLVITE) 400 MCG tablet Take 1,200 mcg by mouth daily.    [provider]  guaiFENesin (MUCINEX) 600 MG 12 hr tablet Take 1,200 mg by mouth 2 (two) times daily.    [provider]  InFLIXimab (REMICADE IV) Inject into the vein every 6 (six) weeks.    [provider]  ipratropium-albuterol (DUONEB) 0.5-2.5 (3) MG/3ML SOLN Take 3 mLs by nebulization every 6 (six) hours.     [provider]  metFORMIN (GLUCOPHAGE) 500 MG tablet Take 1 tablet (500 mg total) by mouth 2 (two) times daily with a meal. 07/23/17 09/12/17  Randel Pigg, Dorma Russell, MD  metFORMIN (GLUCOPHAGE) 500 MG tablet Take 1 tablet (500 mg total) by mouth daily with breakfast. Patient not taking: Reported on 09/12/2017 09/04/17 10/04/17  Albertine Grates, MD  methotrexate (RHEUMATREX) 2.5 MG tablet Take 4 tablets (10 mg total) by mouth once a week. Please hold methotrexate for now, please follow up with Dr Dierdre Forth for further instruction regarding methotrazate. 09/04/17   Albertine Grates, MD  mometasone-formoterol [facility]) 200-5 MCG/ACT AERO Inhale 2 puffs into the lungs 2 (two) times daily. 07/23/17   Lenox Ponds, MD  Multiple Vitamin (MULTIVITAMIN) tablet Take 1 tablet by mouth daily.      [provider]  OXYGEN 2 L. BIPAP with 1lpm o2     [provider]  pantoprazole (PROTONIX) 40 MG tablet Take 40  mg by mouth 2 (two) times daily.     [provider]  potassium chloride (MICRO-K) 10 MEQ CR capsule Take 1 capsule (10 mEq total) by mouth daily. 09/04/17   Albertine Grates, MD  predniSONE (DELTASONE) 10 MG tablet Take 1 tablet (10 mg total) by mouth 2 (two) times daily. 09/04/17   Albertine Grates, MD  Respiratory Therapy Supplies (FLUTTER) DEVI Use as directed 11/04/16   Nyoka Cowden, MD  sodium chloride (OCEAN) 0.65 % SOLN nasal spray Place 1 spray into both nostrils as needed for congestion.    [provider]  Tiotropium Bromide Monohydrate (SPIRIVA RESPIMAT) 2.5 MCG/ACT AERS Inhale 2 puffs into the lungs daily. 05/31/17   Nyoka Cowden, MD    Physical Exam: Vitals:   09/26/17 0315 09/26/17 0330 09/26/17 0345 09/26/17 0400  BP: 109/78 117/72 118/89 111/72  Pulse: (!) 107 (!) 107 (!) 107 (!) 105  Resp: 15 19 18 15   SpO2: 97% 97% 99% 99%  Weight:      Height:          Constitutional: Not in acute distress; appears chronically ill  Eyes: PERTLA, lids and conjunctivae normal ENMT: Mucous membranes are moist. Posterior pharynx clear of any exudate or lesions.   Neck: normal, supple, no masses, no thyromegaly Respiratory: Wheezes bilaterally, more on right than left. Mild tachypnea. No accessory muscle use.  Cardiovascular: Rate ~110 and regular. 2+ pretibial edema. JVP not well visualized. Abdomen: No  distension, no tenderness, soft. Bowel sounds normal.  Musculoskeletal: no clubbing / cyanosis. No joint deformity upper and lower extremities.    Skin: no significant rashes, lesions, ulcers. Warm, dry, well-perfused. Neurologic: No facial asymmetry. Sensation intact. Moving all extremities.  Psychiatric: Alert and oriented x 3. Calm, cooperative.     Labs on Admission: I have personally reviewed following labs and imaging studies  CBC: Recent Labs  Lab 09/26/17 0100  WBC 13.7*  NEUTROABS 8.6*  HGB 12.5*  HCT 37.8*  MCV 105.0*  PLT 230   Basic Metabolic Panel: Recent Labs  Lab 09/26/17 0100 09/26/17 0240  NA 133*  --   K <2.0* 2.1*  CL 81*  --   CO2 31  --   GLUCOSE 289*  --   BUN 105*  --   CREATININE 2.50*  --   CALCIUM 9.5  --   MG  --  2.1   GFR: Estimated Creatinine Clearance: 30.7 mL/min (A) (by C-G formula based on SCr of 2.5 mg/dL (H)). Liver Function Tests: No results for input(s): AST, ALT, ALKPHOS, BILITOT, PROT, ALBUMIN in the last 168 hours. No results for input(s): LIPASE, AMYLASE in the last 168 hours. No results for input(s): AMMONIA in the last 168 hours. Coagulation Profile: No results for input(s): INR, PROTIME in the last 168 hours. Cardiac Enzymes: No results for input(s): CKTOTAL, CKMB, CKMBINDEX, TROPONINI in the last 168 hours. BNP (last 3 results) No results for input(s): PROBNP in the last 8760 hours. HbA1C: No results for input(s): HGBA1C in the last 72 hours. CBG: No results for input(s): GLUCAP in the last 168 hours. Lipid Profile: No results for input(s): CHOL, HDL, LDLCALC, TRIG, CHOLHDL, LDLDIRECT in the last 72 hours. Thyroid Function Tests: No results for input(s): TSH, T4TOTAL, FREET4, T3FREE, THYROIDAB in the last 72 hours. Anemia Panel: No results for input(s): VITAMINB12, FOLATE, FERRITIN, TIBC, IRON, RETICCTPCT in the last 72 hours. Urine analysis:    Component Value Date/Time   COLORURINE YELLOW 09/02/2017 1138  APPEARANCEUR HAZY (A) 09/02/2017 1138   LABSPEC <1.005 (L) 09/02/2017 1138   PHURINE 6.0 09/02/2017 1138   GLUCOSEU NEGATIVE 09/02/2017 1138   HGBUR SMALL (A) 09/02/2017 1138   BILIRUBINUR NEGATIVE 09/02/2017 1138   KETONESUR NEGATIVE 09/02/2017 1138   PROTEINUR NEGATIVE 09/02/2017 1138   UROBILINOGEN 0.2 03/28/2007 1330   NITRITE NEGATIVE 09/02/2017 1138   LEUKOCYTESUR NEGATIVE 09/02/2017 1138   Sepsis Labs: @LABRCNTIP (procalcitonin:4,lacticidven:4) )No results found for this or any previous visit (from the past 240 hour(s)).   Radiological Exams on Admission: Dg Chest 2 View  Result Date: 09/26/2017 CLINICAL DATA:  Shortness of breath for 1 day. EXAM: CHEST - 2 VIEW COMPARISON:  09/12/2017 FINDINGS: Mild cardiac enlargement. Pulmonary vascularity is normal. No airspace disease or consolidation the lungs. No blunting of costophrenic angles. No pneumothorax. Mediastinal contours appear intact. Postoperative changes in the left shoulder. IMPRESSION: Mild cardiac enlargement. No evidence of active pulmonary disease. Electronically Signed   By: Burman Nieves M.D.   On: 09/26/2017 01:58    EKG: Independently reviewed. Sinus or ectopic atrial rhythm, PAC, RBBB, LPPFB, QTc 517.   Assessment/Plan   1. Acute COPD exacerbation; chronic hypoxic respiratory failure   - Presents with worsening SOB, found to have wheezing, no acute CXR findings  - He has a chronic 2 Lpm supplemental O2-requirement and had been on prednisone and doxycyline earlier this month  - He was treated with 125 mg IV Solu-Medrol and nebs prior to arrival in ED, received additional nebs in ED  - Check sputum culture, continue systemic steroid, start azithromycin, continue ICS/LABA and scheduled Duonebs, prn albuterol, supplemental O2    2. Acute kidney injury superimposed on CKD stage III  - SCr is 2.50 on admission, up from 1.38 earlier this month, and BUN is now 105, up from 29 two weeks earlier  - AKI during recent  admission resolved with hydration and holding diuretics; pt was to continue holding his Lasix at home, but continued to take 40 mg BID, reports 45 lb wt-loss, and has become increasingly lightheaded on standing, suggesting a prerenal etiology  - He was given 500 cc NS in ED  - Check urine chemistries and renal US, continue gentle IVF hydration, renally-dose medications, avoid nephrotoxins, and repeat chem panel in am    3. Hypokalemia  - Serum potassium was undetectably low in ED with normal magnesium level  - He was treated with 40 mEq IV and 40 mEq oral potassium in the ED  - Continue cardiac monitoring, add KCl to IVF, repeat chemistry panel    4. Elevated d-dimer; history of DVT and PE  - Patient has hx of bilateral DVT and PE managed with Eliquis  - He reports strict compliance with Eliquis, but remained dyspneic after breathing treatments in ED, was found to have elevated d-dimer, CTA could not be performed d/t acute on chronic renal insufficiency, ED physician discussed with pharmacy, and IV heparin infusion was recommended  - Continue IV heparin, check VQ scan    5. CAD; elevated troponin  - Patient denies any recent chest pain  - EKG with ectopy and prolonged QT likely related to critical hypokalemia  - Troponin is elevated to 0.11  - Patient was started on IV heparin infusion in ED as described above  - Continue cardiac monitoring, trend troponin, continue anticoagulation    6. Chronic diastolic CHF  - Fluid-status difficult to determine clinically d/t body habitus and chronic b/l LE DVT with swelling  - No pulm edema  on CXR, patient reports 45 lb wt loss since June, reports decreased LE edema, and has marked increase in BUN  - Per notes, he was to hold his diuretics following the recent hospitalization but reports continuing Lasix 40 mg BID  - He was treated with 500 cc NS in ED and is continued on gentle IVF hydration in setting of AKI on CKD III  - Follow I/O and daily wts    7. Rheumatoid arthritis  - Managed with methotrexate and Remicade   8. Type II DM  - A1c was 8.0% last month  - Managed with metformin at home, held on admission  - Check CBG's and use a SSI with Novolog while in hospital     DVT prophylaxis: IV heparin infusion, Eliquis prior to admission  Code Status: DNR  Family Communication: Discussed with patient  Consults called: None Admission status: Inpatient     Briscoe Deutscher, MD [facility] Pager [phone]  If 7PM-7AM, please contact night-coverage [email or web address] Password [email or web address]  09/26/2017, 4:18 AM

## 2017-09-26 NOTE — Progress Notes (Addendum)
ANTICOAGULATION CONSULT NOTE - Initial Consult  Pharmacy Consult for Heparin  Indication: PE, concern for apixaban failure  Allergies  Allergen Reactions  . Chantix [Varenicline] Other (See Comments)    Suicidal thoughts   Patient Measurements: Height: 5' 7.01" (170.2 cm) Weight: 212 lb 8.4 oz (96.4 kg) IBW/kg (Calculated) : 66.12  Vital Signs: Temp: 97.5 F (36.4 C) (08/18 0843) Temp Source: Oral (08/18 0843) BP: 109/76 (08/18 0843) Pulse Rate: 99 (08/18 0843)  Labs: Recent Labs    09/26/17 0100 09/26/17 0436 09/26/17 0707 09/26/17 1305  HGB 12.5* 11.3*  --   --   HCT 37.8* 34.7*  --   --   PLT 230 184  --   --   APTT  --   --   --  >200*  HEPARINUNFRC  --   --   --  >2.20*  CREATININE 2.50* 2.25*  --   --   TROPONINI  --   --  0.09* 0.09*    Estimated Creatinine Clearance: 32.8 mL/min (A) (by C-G formula based on SCr of 2.25 mg/dL (H)).   Medical History: Past Medical History:  Diagnosis Date  . AAA (abdominal aortic aneurysm) (HCC)   . Allergy    Rhinitis  . Arthritis    Rhuematoid  . Asthma   . Chronic renal insufficiency   . Diverticulosis   . Emphysema   . GERD (gastroesophageal reflux disease)   . Hearing loss of both ears   . Hiatal hernia   . Hyperlipidemia   . Hypertension   . Male hypogonadism   . Nephrolithiasis   . Nephrolithiasis   . Sleep apnea    On CPAP  . Stasis dermatitis   . Varicose veins   . Vitamin B 12 deficiency    Assessment: 72 y/o M who has been on Apixaban since June 2019 for DVT and PE. Since then he has continued to have ongoing/worsening shortness of breath. States good apixaban compliance with last dose about 5 hours PTA.   APTT came back at > 200. This was redrawn by lab in the opposite arm of the infusion for the patient. Nurse has been notified to hold the heparin drip for 1 hour, and infusion will be restarted at a lower rate.   Goal of Therapy:  Heparin level 0.3-0.7 units/ml aPTT 66-102 seconds Monitor  platelets by anticoagulation protocol: Yes   Plan:  -Hold heparin drip for 1 hour -Restart heparin drip at 1150 units/hr -1600 aPTT/HL -Daily CBC/aPTT/HL -Monitor for bleeding -F/U imaging (?VQ) to assess if apixaban failure is present  Otis Peak, PharmD PGY1 Pharmacy Resident Phone: [phone] 09/26/2017,2:14 PM

## 2017-09-26 NOTE — Progress Notes (Signed)
PROGRESS NOTE    Chad Duke  XBJ:[id number] DOB: 04/15/1945 DOA: 09/26/2017 PCP: [name], MD      Brief Narrative:  Mr. Chad Duke is a 72 y.o. M with RA on methotrexate, CKD III, COPD recently started home O2 formerly steroid dependent with Pulmonology, dCHF, hx DVT and PE on Eliquis, AAA and CAD, OSA on BiPAP at night, HTN, and steroid-induced DM who presents with dyspnea on exertion and orthostasis.  He was recently admitted to the hospital twice in July with AKI, hypokalemia and orthostasis from overdiuresis.  After his last discharge, he saw Cardiology, who deferred diuretic dosing to his PCP, who restarted the Lasix about 2 weeks ago, 40 mg BID.  Since then he had progressive weight loss and orthostatic symptoms until the day of admission he was too weak to stand.   Assessment & Plan:  COPD exacerbation On chronic prednisone.  Only oxygen dependence since June. - Continue Solu-Medrol -Continue scheduled and PRN bronchodilators -Continue doxycycline -Continue Mucinex -Continue PPI  Elevated d-dimer VQ scan negative for pulmonary embolism. -Obtain bilateral lower extremity ultrasounds  Elevated troponin Low risk nuclear stress in June of this year.  Dyspnea better explained by COPD exacerbation/wheezing.  Doubt ACS, suspect troponin leak/type II NSTEMI in the setting of severe COPD exacerbation.  Chronic diastolic CHF He is hypovolemic. -Hold diuretics  Acute kidney injury on chronic kidney disease stage III -Hold diuretics -IV fluids  Severe hypokalemia Magnesium repleted, potassium repletion begun. - IV fluids with K  History of venous thromboembolism.  Vascular disease New aspirin, atorvastatin  Sleep apnea  Hypertension  Diabetes  -Hold home metformin -Sliding scale corrections as needed  Rheumatoid arthritis On Remicade and chronic prednisone -Continue methotrexate weekly  Gout -Continue allopurinol  Hyponatremia Hypovolemic  Macrocytic  anemia Mild -Continue folate and B12       DVT prophylaxis: N/A on therapeutic anticoagulation Code Status: DO NOT RESUSCITATE Family Communication: None present MDM and disposition Plan: This is a no charge note.  For further details, please see H&P by my partner Dr. Antionette Char from earlier today.  The below labs and imaging reports were reviewed and summarized above.    The patient was admitted with COPD flare.  PE ruled out with VQ. Suspect troponin is from type 2 nstemi with severe COPD.  Also AKI given diuresis.    Objective: Vitals:   09/26/17 0630 09/26/17 0645 09/26/17 0843 09/26/17 1438  BP: 123/70 102/67 109/76   Pulse: (!) 104 (!) 104 99   Resp: (!) 22 19 20    Temp:   (!) 97.5 F (36.4 C)   TempSrc:   Oral   SpO2: 97% 96% 99% 99%  Weight:   96.4 kg   Height:   5' 7.01" (1.702 m)     Intake/Output Summary (Last 24 hours) at 09/26/2017 1557 Last data filed at 09/26/2017 1300 Gross per 24 hour  Intake 4040 ml  Output 700 ml  Net 3340 ml   Filed Weights   09/26/17 0058 09/26/17 0843  Weight: 103.9 kg 96.4 kg    Examination: The patient was seen and examined.      Data Reviewed: I have personally reviewed following labs and imaging studies:  CBC: Recent Labs  Lab 09/26/17 0100 09/26/17 0436  WBC 13.7* 11.2*  NEUTROABS 8.6* 10.2*  HGB 12.5* 11.3*  HCT 37.8* 34.7*  MCV 105.0* 104.8*  PLT 230 184   Basic Metabolic Panel: Recent Labs  Lab 09/26/17 0100 09/26/17 0240 09/26/17 0436  NA 133*  --  132*  K <2.0* 2.1* 2.2*  CL 81*  --  83*  CO2 31  --  31  GLUCOSE 289*  --  326*  BUN 105*  --  101*  CREATININE 2.50*  --  2.25*  CALCIUM 9.5  --  9.1  MG  --  2.1  --    GFR: Estimated Creatinine Clearance: 32.8 mL/min (A) (by C-G formula based on SCr of 2.25 mg/dL (H)). Liver Function Tests: No results for input(s): AST, ALT, ALKPHOS, BILITOT, PROT, ALBUMIN in the last 168 hours. No results for input(s): LIPASE, AMYLASE in the last 168 hours. No  results for input(s): AMMONIA in the last 168 hours. Coagulation Profile: No results for input(s): INR, PROTIME in the last 168 hours. Cardiac Enzymes: Recent Labs  Lab 09/26/17 0707 09/26/17 1305  TROPONINI 0.09* 0.09*   BNP (last 3 results) No results for input(s): PROBNP in the last 8760 hours. HbA1C: No results for input(s): HGBA1C in the last 72 hours. CBG: Recent Labs  Lab 09/26/17 0719 09/26/17 1254  GLUCAP 333* 330*   Lipid Profile: No results for input(s): CHOL, HDL, LDLCALC, TRIG, CHOLHDL, LDLDIRECT in the last 72 hours. Thyroid Function Tests: No results for input(s): TSH, T4TOTAL, FREET4, T3FREE, THYROIDAB in the last 72 hours. Anemia Panel: No results for input(s): VITAMINB12, FOLATE, FERRITIN, TIBC, IRON, RETICCTPCT in the last 72 hours. Urine analysis:    Component Value Date/Time   COLORURINE YELLOW 09/02/2017 1138   APPEARANCEUR HAZY (A) 09/02/2017 1138   LABSPEC <1.005 (L) 09/02/2017 1138   PHURINE 6.0 09/02/2017 1138   GLUCOSEU NEGATIVE 09/02/2017 1138   HGBUR SMALL (A) 09/02/2017 1138   BILIRUBINUR NEGATIVE 09/02/2017 1138   KETONESUR NEGATIVE 09/02/2017 1138   PROTEINUR NEGATIVE 09/02/2017 1138   UROBILINOGEN 0.2 03/28/2007 1330   NITRITE NEGATIVE 09/02/2017 1138   LEUKOCYTESUR NEGATIVE 09/02/2017 1138   Sepsis Labs: @LABRCNTIP (procalcitonin:4,lacticacidven:4)  )No results found for this or any previous visit (from the past 240 hour(s)).       Radiology Studies: Dg Chest 2 View  Result Date: 09/26/2017 CLINICAL DATA:  Shortness of breath for 1 day. EXAM: CHEST - 2 VIEW COMPARISON:  09/12/2017 FINDINGS: Mild cardiac enlargement. Pulmonary vascularity is normal. No airspace disease or consolidation the lungs. No blunting of costophrenic angles. No pneumothorax. Mediastinal contours appear intact. Postoperative changes in the left shoulder. IMPRESSION: Mild cardiac enlargement. No evidence of active pulmonary disease. Electronically Signed    By: 11/12/2017 M.D.   On: 09/26/2017 01:58   09/28/2017 Renal  Result Date: 09/26/2017 CLINICAL DATA:  Acute renal failure superimposed on stage 3 chronic kidney disease. EXAM: RENAL / URINARY TRACT ULTRASOUND COMPLETE COMPARISON:  None. FINDINGS: Right Kidney: Length: 7.8 cm. Diffuse parenchymal atrophy. No mass or hydronephrosis demonstrated. Left Kidney: Length: 9.1 cm. Diffuse parenchymal atrophy. No mass or hydronephrosis demonstrated. Bladder: No bladder wall thickening or filling defects. Examination is technically limited by patient's body habitus. IMPRESSION: Bilateral renal parenchymal atrophy likely indicating chronic medical renal disease. No hydronephrosis. Bladder is unremarkable. Electronically Signed   By: 09/28/2017 M.D.   On: 09/26/2017 05:16   Nm Pulmonary Perf And Vent  Result Date: 09/26/2017 CLINICAL DATA:  72 year old male with shortness of breath, abnormal D-dimer. EXAM: NUCLEAR MEDICINE VENTILATION - PERFUSION LUNG SCAN TECHNIQUE: Ventilation images were obtained in multiple projections using inhaled aerosol Tc-41m DTPA. Perfusion images were obtained in multiple projections after intravenous injection of Tc-34m-MAA. RADIOPHARMACEUTICALS:  32.5 mCi of Tc-43m DTPA aerosol inhalation and 4.3 mCi  Tc92m-MAA IV COMPARISON:  Chest radiographs 0139 hours today. FINDINGS: Ventilation: Artifactual clumping of the radiotracer at the central airways. There is a wedge-shaped right upper lung ventilation defect, although this may be artifact. Perfusion: Homogeneous perfusion radiotracer activity in both lungs with no perfusion defect. IMPRESSION: No perfusion defect, no evidence of pulmonary embolus. Electronically Signed   By: Odessa Fleming M.D.   On: 09/26/2017 13:42        Scheduled Meds: . allopurinol  100 mg Oral Daily  . apixaban  5 mg Oral BID  . atorvastatin  40 mg Oral q1800  . calcium-vitamin D  1 tablet Oral Q breakfast  . doxycycline  100 mg Oral Q12H  . folic acid  1 mg  Oral Daily  . guaiFENesin  1,200 mg Oral BID  . insulin aspart  0-5 Units Subcutaneous QHS  . insulin aspart  0-9 Units Subcutaneous TID WC  . ipratropium-albuterol  3 mL Nebulization Q6H  . methylPREDNISolone (SOLU-MEDROL) injection  60 mg Intravenous Q6H  . mometasone-formoterol  2 puff Inhalation BID  . multivitamin with minerals  1 tablet Oral Daily  . pantoprazole  40 mg Oral BID  . potassium chloride SA  40 mEq Oral BID WC  . sodium chloride flush  3 mL Intravenous Q12H  . vitamin B-12  2,500 mcg Oral Daily   Continuous Infusions: . 0.9 % NaCl with KCl 40 mEq / L 75 mL/hr (09/26/17 0901)     LOS: 0 days    Time spent: 25 minutes    Alberteen Sam, MD [facility] 09/26/2017, 3:57 PM     Pager [phone] --- please page though AMION:  [email or web address] If 7PM-7AM, please contact night-coverage

## 2017-09-26 NOTE — ED Provider Notes (Signed)
[facility] EMERGENCY DEPARTMENT Provider Note   CSN: [id number] Arrival date & time: 09/26/17  0049     History   Chief Complaint Chief Complaint  Patient presents with  . Shortness of Breath    HPI Chad Duke is a 72 y.o. male.  72 yo M with a chief complaint of shortness of breath.  This is usually on exertion.  Going on for the past couple months but worsening over the past few days.  Denies increased cough increased sputum production or change in sputum.  Denies fevers or chills denies chest pain.  Denies worsening lower extremity edema.  Patient has a history of COPD as well as heart failure.  He is not sure if it feels like 1 of the other.  Have been using his inhaler without improvement.  He was given magnesium DuoNeb Solu-Medrol with EMS without significant improvement.  He is chronically on 2 L of oxygen at all times.  The history is provided by the patient.  Shortness of Breath  This is a new problem. The average episode lasts 2 months. The problem occurs continuously.The current episode started 2 days ago. The problem has been rapidly worsening. Pertinent negatives include no fever, no headaches, no chest pain, no vomiting, no abdominal pain and no rash. He has tried nothing for the symptoms. The treatment provided no relief. He has had prior hospitalizations. He has had prior ED visits. Associated medical issues include COPD, chronic lung disease and heart failure. Associated medical issues do not include PE or past MI.    Past Medical History:  Diagnosis Date  . AAA (abdominal aortic aneurysm) (HCC)   . Allergy    Rhinitis  . Arthritis    Rhuematoid  . Asthma   . Chronic renal insufficiency   . Diverticulosis   . Emphysema   . GERD (gastroesophageal reflux disease)   . Hearing loss of both ears   . Hiatal hernia   . Hyperlipidemia   . Hypertension   . Male hypogonadism   . Nephrolithiasis   . Nephrolithiasis   . Sleep apnea    On CPAP    . Stasis dermatitis   . Varicose veins   . Vitamin B 12 deficiency     Patient Active Problem List   Diagnosis Date Noted  . CAD (coronary artery disease) 09/08/2017  . Goals of care, counseling/discussion   . Palliative care by specialist   . Acute kidney injury superimposed on chronic kidney disease (HCC) 09/02/2017  . Hypokalemia 09/02/2017  . Hypomagnesemia 09/02/2017  . Controlled type 2 diabetes mellitus with hyperglycemia (HCC) 09/02/2017  . Urinary incontinence 09/02/2017  . Fall 08/13/2017  . Acute renal failure superimposed on stage 3 chronic kidney disease (HCC) 08/13/2017  . HLD (hyperlipidemia) 08/13/2017  . Essential hypertension 08/13/2017  . GERD (gastroesophageal reflux disease) 08/13/2017  . Orthostatic hypotension 08/13/2017  . Deep vein thrombosis (DVT) of both lower extremities (HCC)   . Hyperglycemia   . History of pulmonary embolus (PE) 07/20/2017  . Chronic diastolic CHF (congestive heart failure) (HCC) 07/20/2017  . Bilateral leg edema 07/20/2017  . Acute exacerbation of chronic obstructive pulmonary disease (COPD) (HCC) 07/19/2017  . 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 Cigarette smoker 09/08/2016  . Morbid obesity due to excess calories (HCC) 09/08/2016  . Pain in joint, ankle and foot 03/31/2012  . Abnormality of gait 03/31/2012  . Rheumatoid arthritis (HCC) 03/31/2012  . COPD GOLD III  02/12/2011  . Sleep apnea  02/12/2011    Past Surgical History:  Procedure Laterality Date  . CARPAL TUNNEL RELEASE     bilateral  . FRACTURE SURGERY Left    Fibula  . JOINT REPLACEMENT Left    Shoulder  . JOINT REPLACEMENT Left    Hip   . KIDNEY STONE SURGERY  2003  . left hip replacement    . left knee    . left shoulder    . LITHOTRIPSY          Home Medications    Prior to Admission medications   Medication Sig Start Date End Date Taking? Authorizing Provider  albuterol (PROVENTIL HFA;VENTOLIN HFA) 108 (90 Base) MCG/ACT inhaler Inhale 2  puffs into the lungs every 4 (four) hours as needed for wheezing or shortness of breath. 07/30/17   Nyoka Cowden, MD  allopurinol (ZYLOPRIM) 100 MG tablet Take 1 tablet (100 mg total) by mouth daily. 09/04/17 10/04/17  Albertine Grates, MD  apixaban (ELIQUIS) 5 MG TABS tablet Take 1 tablet (5 mg total) by mouth 2 (two) times daily. 09/04/17   Albertine Grates, MD  Ascorbic Acid (VITAMIN C) 1000 MG tablet Take 1,000 mg by mouth daily.    [provider]  atorvastatin (LIPITOR) 40 MG tablet Take 1 tablet (40 mg total) by mouth daily. 05/18/17 09/12/17  Lewayne Bunting, MD  Calcium-Magnesium-Vitamin D (CITRACAL CALCIUM+D) 600-40-500 MG-MG-UNIT TB24 Take 1 tablet by mouth daily.      [provider]  cetirizine (ZYRTEC) 10 MG tablet Take 10 mg by mouth daily.      [provider]  cyanocobalamin 2000 MCG tablet Take 2,500 mcg by mouth daily.     [provider]  doxycycline (VIBRAMYCIN) 100 MG capsule Take 1 capsule (100 mg total) by mouth 2 (two) times daily. 09/12/17   Tilden Fossa, MD  folic acid (FOLVITE) 400 MCG tablet Take 1,200 mcg by mouth daily.    [provider]  guaiFENesin (MUCINEX) 600 MG 12 hr tablet Take 1,200 mg by mouth 2 (two) times daily.    [provider]  InFLIXimab (REMICADE IV) Inject into the vein every 6 (six) weeks.    [provider]  ipratropium-albuterol (DUONEB) 0.5-2.5 (3) MG/3ML SOLN Take 3 mLs by nebulization every 6 (six) hours.     [provider]  metFORMIN (GLUCOPHAGE) 500 MG tablet Take 1 tablet (500 mg total) by mouth 2 (two) times daily with a meal. 07/23/17 09/12/17  Randel Pigg, Dorma Russell, MD  metFORMIN (GLUCOPHAGE) 500 MG tablet Take 1 tablet (500 mg total) by mouth daily with breakfast. Patient not taking: Reported on 09/12/2017 09/04/17 10/04/17  Albertine Grates, MD  methotrexate (RHEUMATREX) 2.5 MG tablet Take 4 tablets (10 mg total) by mouth once a week. Please hold methotrexate for now, please follow up with Dr Dierdre Forth  for further instruction regarding methotrazate. 09/04/17   Albertine Grates, MD  mometasone-formoterol [facility]) 200-5 MCG/ACT AERO Inhale 2 puffs into the lungs 2 (two) times daily. 07/23/17   Lenox Ponds, MD  Multiple Vitamin (MULTIVITAMIN) tablet Take 1 tablet by mouth daily.      [provider]  OXYGEN 2 L. BIPAP with 1lpm o2     [provider]  pantoprazole (PROTONIX) 40 MG tablet Take 40 mg by mouth 2 (two) times daily.     [provider]  potassium chloride (MICRO-K) 10 MEQ CR capsule Take 1 capsule (10 mEq total) by mouth daily. 09/04/17   Albertine Grates, MD  predniSONE Lorenza Chick)  10 MG tablet Take 1 tablet (10 mg total) by mouth 2 (two) times daily. 09/04/17   Albertine Grates, MD  Respiratory Therapy Supplies (FLUTTER) DEVI Use as directed 11/04/16   Nyoka Cowden, MD  sodium chloride (OCEAN) 0.65 % SOLN nasal spray Place 1 spray into both nostrils as needed for congestion.    [provider]  Tiotropium Bromide Monohydrate (SPIRIVA RESPIMAT) 2.5 MCG/ACT AERS Inhale 2 puffs into the lungs daily. 05/31/17   Nyoka Cowden, MD    Family History Family History  Problem Relation Age of Onset  . Emphysema Father   . Asthma Father   . Osteoarthritis Father   . Heart attack Mother   . Uterine cancer Mother   . Heart disease Mother   . ADD / ADHD Son   . Depression Son   . Hypertension Brother   . Heart disease Brother        Pacemaker   . Heart murmur Brother     Social History Social History   Tobacco Use  . Smoking status: Former Smoker    Packs/day: 0.10    Years: 53.00    Pack years: 5.30    Types: Cigarettes    Last attempt to quit: 08/11/2017    Years since quitting: 0.1  . Smokeless tobacco: Never Used  . Tobacco comment: 1-2 cigarettes a day   Substance Use Topics  . Alcohol use: Not Currently    Alcohol/week: 0.0 standard drinks  . Drug use: No     Allergies   Chantix [varenicline]   Review of Systems Review of Systems    Constitutional: Negative for chills and fever.  HENT: Negative for congestion and facial swelling.   Eyes: Negative for discharge and visual disturbance.  Respiratory: Positive for shortness of breath.   Cardiovascular: Negative for chest pain and palpitations.  Gastrointestinal: Negative for abdominal pain, diarrhea, nausea and vomiting.  Musculoskeletal: Negative for arthralgias and myalgias.  Skin: Negative for color change and rash.  Neurological: Negative for tremors, syncope and headaches.  Psychiatric/Behavioral: Negative for confusion and dysphoric mood.     Physical Exam Updated Vital Signs BP 94/64   Pulse (!) 108   Resp 18   Ht 5\' 7"  (1.702 m)   Wt 103.9 kg   SpO2 94%   BMI 35.87 kg/m   Physical Exam  Constitutional: He is oriented to person, place, and time. He appears well-developed and well-nourished.  obese  HENT:  Head: Normocephalic and atraumatic.  Eyes: Pupils are equal, round, and reactive to light. EOM are normal.  Neck: Normal range of motion. Neck supple. No JVD present.  Cardiovascular: Normal rate and regular rhythm. Exam reveals no gallop and no friction rub.  No murmur heard. Pulmonary/Chest: No respiratory distress. He has wheezes.  Prolonged expiration  Abdominal: He exhibits no distension. There is no rebound and no guarding.  Musculoskeletal: Normal range of motion. He exhibits edema (trace bilaterally).  Neurological: He is alert and oriented to person, place, and time.  Skin: No rash noted. No pallor.  Psychiatric: He has a normal mood and affect. His behavior is normal.  Nursing note and vitals reviewed.    ED Treatments / Results  Labs (all labs ordered are listed, but only abnormal results are displayed) Labs Reviewed  CBC WITH DIFFERENTIAL/PLATELET - Abnormal; Notable for the following components:      Result Value   WBC 13.7 (*)    RBC 3.60 (*)    Hemoglobin 12.5 (*)  HCT 37.8 (*)    MCV 105.0 (*)    MCH 34.7 (*)    RDW  16.9 (*)    Neutro Abs 8.6 (*)    Monocytes Absolute 1.2 (*)    Abs Immature Granulocytes 0.3 (*)    All other components within normal limits  BASIC METABOLIC PANEL - Abnormal; Notable for the following components:   Sodium 133 (*)    Potassium <2.0 (*)    Chloride 81 (*)    Glucose, Bld 289 (*)    BUN 105 (*)    Creatinine, Ser 2.50 (*)    GFR calc non Af Amer 24 (*)    GFR calc Af Amer 28 (*)    Anion gap 21 (*)    All other components within normal limits  D-DIMER, QUANTITATIVE (NOT AT [facility]) - Abnormal; Notable for the following components:   D-Dimer, Quant 1.06 (*)    All other components within normal limits  I-STAT TROPONIN, ED - Abnormal; Notable for the following components:   Troponin i, poc 0.11 (*)    All other components within normal limits  BRAIN NATRIURETIC PEPTIDE  POTASSIUM  MAGNESIUM    EKG EKG Interpretation  Date/Time:  Sunday September 26 2017 01:02:36 EDT Ventricular Rate:  110 PR Interval:    QRS Duration: 164 QT Interval:  382 QTC Calculation: 517 R Axis:   114 Text Interpretation:  Sinus or ectopic atrial tachycardia Atrial premature complex RBBB and LPFB Baseline wander in lead(s) V2 Prolonged QT No significant change since last tracing Reconfirmed by ,  ([id number]) on 09/26/2017 2:26:23 AM   Radiology Dg Chest 2 View  Result Date: 09/26/2017 CLINICAL DATA:  Shortness of breath for 1 day. EXAM: CHEST - 2 VIEW COMPARISON:  09/12/2017 FINDINGS: Mild cardiac enlargement. Pulmonary vascularity is normal. No airspace disease or consolidation the lungs. No blunting of costophrenic angles. No pneumothorax. Mediastinal contours appear intact. Postoperative changes in the left shoulder. IMPRESSION: Mild cardiac enlargement. No evidence of active pulmonary disease. Electronically Signed   By: William  Stevens M.D.   On: 09/26/2017 01:58    Procedures Procedures (including critical care time)  Medications Ordered in ED Medications    ipratropium-albuterol (DUONEB) 0.5-2.5 (3) MG/3ML nebulizer solution 3 mL (3 mLs Nebulization Given 09/26/17 0157)  potassium chloride SA (K-DUR,KLOR-CON) CR tablet 40 mEq (has no administration in time range)  magnesium oxide (MAG-OX) tablet 800 mg (has no administration in time range)  potassium chloride 10 mEq in 100 mL IVPB (has no administration in time range)  heparin bolus via infusion 5,000 Units (has no administration in time range)  heparin ADULT infusion 100 units/mL (25000 units/250mL sodium chloride 0.45%) (has no administration in time range)  sodium chloride 0.9 % bolus 500 mL (has no administration in time range)     Initial Impression / Assessment and Plan / ED Course  I have reviewed the triage vital signs and the nursing notes.  Pertinent labs & imaging results that were available during my care of the patient were reviewed by me and considered in my medical decision making (see chart for details).     72  yo M with a chief complaint of shortness of breath on exertion.  Going on for a couple months but worsening over the past couple days.  Patient has a history of COPD as well as CHF.  Clinically he appears to be wheezing I do not hear rales JVD just above the clavicles, trace edema bilaterally.  Given breathing treatments with  improved aeration.  Patient still feels tight.  His troponin was positive at 0.11.  He has had one positive previously when he was diagnosed with a PE at 0.06.  Since he is persistently felt symptomatic since his diagnosis and is only gotten worse I am concerned may be the patient has worsening clot burden.  I am unable to CT angiogram him as his renal function has worsened.  He has a positive d-dimer as well.  I discussed the case with the pharmacist who recommended starting him on heparin even though he stated that he was taking his Eliquis reliably.  His BNP was normal consistent with my exam.  He has a mild leukocytosis.  His potassium was less than  2.  His BUN was significantly elevated at 105 and had an anion gap of 21.  I will start replenishing his potassium as his EKG has a prolonged QT consistent with that electrolyte abnormality.  Based on his BUN and creatinine I will give him a bolus of fluids. Will discuss with the hospitalist for admission.   CRITICAL CARE Performed by: Rae Roam   Total critical care time: 80 minutes  Critical care time was exclusive of separately billable procedures and treating other patients.  Critical care was necessary to treat or prevent imminent or life-threatening deterioration.  Critical care was time spent personally by me on the following activities: development of treatment plan with patient and/or surrogate as well as nursing, discussions with consultants, evaluation of patient's response to treatment, examination of patient, obtaining history from patient or surrogate, ordering and performing treatments and interventions, ordering and review of laboratory studies, ordering and review of radiographic studies, pulse oximetry and re-evaluation of patient's condition.  The patients results and plan were reviewed and discussed.   Any x-rays performed were independently reviewed by myself.   Differential diagnosis were considered with the presenting HPI.  Medications  ipratropium-albuterol (DUONEB) 0.5-2.5 (3) MG/3ML nebulizer solution 3 mL (3 mLs Nebulization Given 09/26/17 0157)  potassium chloride SA (K-DUR,KLOR-CON) CR tablet 40 mEq (has no administration in time range)  magnesium oxide (MAG-OX) tablet 800 mg (has no administration in time range)  potassium chloride 10 mEq in 100 mL IVPB (has no administration in time range)  heparin bolus via infusion 5,000 Units (has no administration in time range)  heparin ADULT infusion 100 units/mL (25000 units/287mL sodium chloride 0.45%) (has no administration in time range)  sodium chloride 0.9 % bolus 500 mL (has no administration in time  range)    Vitals:   09/26/17 0057 09/26/17 0058 09/26/17 0105 09/26/17 0110  BP: (!) 107/42   94/64  Pulse: (!) 111  (!) 110 (!) 108  Resp:   15 18  SpO2: 92%  96% 94%  Weight:  103.9 kg    Height:  5\' 7"  (1.702 m)      Final diagnoses:  COPD exacerbation (HCC)  SOB (shortness of breath)  Pulmonary embolism without acute cor pulmonale, unspecified chronicity, unspecified pulmonary embolism type (HCC)  AKI (acute kidney injury) (HCC)  Hypokalemia    Admission/ observation were discussed with the admitting physician, patient and/or family and they are comfortable with the plan.   Final Clinical Impressions(s) / ED Diagnoses   Final diagnoses:  COPD exacerbation (HCC)  SOB (shortness of breath)  Pulmonary embolism without acute cor pulmonale, unspecified chronicity, unspecified pulmonary embolism type (HCC)  AKI (acute kidney injury) (HCC)  Hypokalemia    ED Discharge Orders    None  Melene Plan, DO 09/26/17 [phone]

## 2017-09-27 ENCOUNTER — Encounter (HOSPITAL_COMMUNITY): Payer: Medicare Other

## 2017-09-27 ENCOUNTER — Inpatient Hospital Stay (HOSPITAL_COMMUNITY): Payer: Medicare Other

## 2017-09-27 DIAGNOSIS — E876 Hypokalemia: Secondary | ICD-10-CM

## 2017-09-27 DIAGNOSIS — M7989 Other specified soft tissue disorders: Secondary | ICD-10-CM

## 2017-09-27 DIAGNOSIS — I2510 Atherosclerotic heart disease of native coronary artery without angina pectoris: Secondary | ICD-10-CM

## 2017-09-27 DIAGNOSIS — M069 Rheumatoid arthritis, unspecified: Secondary | ICD-10-CM

## 2017-09-27 DIAGNOSIS — I5032 Chronic diastolic (congestive) heart failure: Secondary | ICD-10-CM

## 2017-09-27 DIAGNOSIS — Z86711 Personal history of pulmonary embolism: Secondary | ICD-10-CM

## 2017-09-27 DIAGNOSIS — R748 Abnormal levels of other serum enzymes: Secondary | ICD-10-CM

## 2017-09-27 DIAGNOSIS — E1165 Type 2 diabetes mellitus with hyperglycemia: Secondary | ICD-10-CM

## 2017-09-27 DIAGNOSIS — Z86718 Personal history of other venous thrombosis and embolism: Secondary | ICD-10-CM

## 2017-09-27 DIAGNOSIS — N179 Acute kidney failure, unspecified: Secondary | ICD-10-CM

## 2017-09-27 DIAGNOSIS — N183 Chronic kidney disease, stage 3 (moderate): Secondary | ICD-10-CM

## 2017-09-27 LAB — GLUCOSE, CAPILLARY
Glucose-Capillary: 331 mg/dL — ABNORMAL HIGH (ref 70–99)
Glucose-Capillary: 396 mg/dL — ABNORMAL HIGH (ref 70–99)
Glucose-Capillary: 363 mg/dL — ABNORMAL HIGH (ref 70–99)
Glucose-Capillary: 355 mg/dL — ABNORMAL HIGH (ref 70–99)

## 2017-09-27 LAB — BASIC METABOLIC PANEL
Anion gap: 15 (ref 5–15)
BUN: 79 mg/dL — ABNORMAL HIGH (ref 8–23)
CO2: 28 mmol/L (ref 22–32)
Calcium: 9.2 mg/dL (ref 8.9–10.3)
Chloride: 91 mmol/L — ABNORMAL LOW (ref 98–111)
Creatinine, Ser: 2.01 mg/dL — ABNORMAL HIGH (ref 0.61–1.24)
GFR calc Af Amer: 36 mL/min — ABNORMAL LOW (ref >60)
GFR calc non Af Amer: 31 mL/min — ABNORMAL LOW (ref >60)
Glucose, Bld: 358 mg/dL — ABNORMAL HIGH (ref 70–99)
Potassium: 3 mmol/L — ABNORMAL LOW (ref 3.5–5.1)
Sodium: 134 mmol/L — ABNORMAL LOW (ref 135–145)

## 2017-09-27 LAB — CBC
HCT: 31.7 % — ABNORMAL LOW (ref 39.0–52.0)
Hemoglobin: 10.4 g/dL — ABNORMAL LOW (ref 13.0–17.0)
MCH: 34.6 pg — ABNORMAL HIGH (ref 26.0–34.0)
MCHC: 32.8 g/dL (ref 30.0–36.0)
MCV: 105.3 fL — ABNORMAL HIGH (ref 78.0–100.0)
Platelets: 157 10*3/uL (ref 150–400)
RBC: 3.01 MIL/uL — ABNORMAL LOW (ref 4.22–5.81)
RDW: 17 % — ABNORMAL HIGH (ref 11.5–15.5)
WBC: 13.1 10*3/uL — ABNORMAL HIGH (ref 4.0–10.5)

## 2017-09-27 LAB — UREA NITROGEN, URINE: Urea Nitrogen, Ur: 399 mg/dL

## 2017-09-27 MED ORDER — INSULIN ASPART 100 UNIT/ML ~~LOC~~ SOLN
0.0000 [IU] | Freq: Every day | SUBCUTANEOUS | Status: DC
  Administered 2017-09-27: 5 [IU] via SUBCUTANEOUS
  Administered 2017-09-28: 4 [IU] via SUBCUTANEOUS

## 2017-09-27 MED ORDER — POTASSIUM CHLORIDE CRYS ER 20 MEQ PO TBCR
40.0000 meq | EXTENDED_RELEASE_TABLET | Freq: Two times a day (BID) | ORAL | Status: AC
  Administered 2017-09-27 – 2017-09-28 (×3): 40 meq via ORAL
  Filled 2017-09-27 (×3): qty 2

## 2017-09-27 MED ORDER — SODIUM CHLORIDE 0.9 % IV SOLN
INTRAVENOUS | Status: AC
  Administered 2017-09-27: 08:00:00 via INTRAVENOUS

## 2017-09-27 MED ORDER — INSULIN GLARGINE 100 UNIT/ML ~~LOC~~ SOLN
24.0000 [IU] | Freq: Every day | SUBCUTANEOUS | Status: DC
  Administered 2017-09-27 – 2017-09-29 (×3): 24 [IU] via SUBCUTANEOUS
  Filled 2017-09-27 (×3): qty 0.24

## 2017-09-27 MED ORDER — INSULIN ASPART 100 UNIT/ML ~~LOC~~ SOLN
0.0000 [IU] | Freq: Three times a day (TID) | SUBCUTANEOUS | Status: DC
  Administered 2017-09-27 (×2): 20 [IU] via SUBCUTANEOUS
  Administered 2017-09-27: 15 [IU] via SUBCUTANEOUS
  Administered 2017-09-28 (×2): 7 [IU] via SUBCUTANEOUS
  Administered 2017-09-28: 15 [IU] via SUBCUTANEOUS
  Administered 2017-09-29 (×2): 7 [IU] via SUBCUTANEOUS

## 2017-09-27 MED ORDER — METHYLPREDNISOLONE SODIUM SUCC 40 MG IJ SOLR
40.0000 mg | Freq: Two times a day (BID) | INTRAMUSCULAR | Status: DC
  Administered 2017-09-27 – 2017-09-29 (×4): 40 mg via INTRAVENOUS
  Filled 2017-09-27 (×4): qty 1

## 2017-09-27 NOTE — Discharge Instructions (Signed)
Information on my medicine - ELIQUIS (apixaban)  This medication education was reviewed with me or my healthcare representative as part of my discharge preparation.  The pharmacist that spoke with me during my hospital stay was:    Why was Eliquis prescribed for you? Eliquis was prescribed to treat blood clots that may have been found in the veins of your legs (deep vein thrombosis) or in your lungs (pulmonary embolism) and to reduce the risk of them occurring again.  What do You need to know about Eliquis ? Continue apixaban ONE 5 mg tablet taken TWICE daily.  Eliquis may be taken with or without food.   Try to take the dose about the same time in the morning and in the evening. If you have difficulty swallowing the tablet whole please discuss with your pharmacist how to take the medication safely.  Take Eliquis exactly as prescribed and DO NOT stop taking Eliquis without talking to the doctor who prescribed the medication.  Stopping may increase your risk of developing a new blood clot.  Refill your prescription before you run out.  After discharge, you should have regular check-up appointments with your healthcare provider that is prescribing your Eliquis.    What do you do if you miss a dose? If a dose of ELIQUIS is not taken at the scheduled time, take it as soon as possible on the same day and twice-daily administration should be resumed. The dose should not be doubled to make up for a missed dose.  Important Safety Information A possible side effect of Eliquis is bleeding. You should call your healthcare provider right away if you experience any of the following: ? Bleeding from an injury or your nose that does not stop. ? Unusual colored urine (red or dark brown) or unusual colored stools (red or black). ? Unusual bruising for unknown reasons. ? A serious fall or if you hit your head (even if there is no bleeding).  Some medicines may interact with Eliquis and might  increase your risk of bleeding or clotting while on Eliquis. To help avoid this, consult your healthcare provider or pharmacist prior to using any new prescription or non-prescription medications, including herbals, vitamins, non-steroidal anti-inflammatory drugs (NSAIDs) and supplements.  This website has more information on Eliquis (apixaban): http://www.eliquis.com/eliquis/home

## 2017-09-27 NOTE — Progress Notes (Signed)
PROGRESS NOTE    Chad Duke  [id number]:[id number] DOB: 06-20-1945 DOA: 09/26/2017 PCP: [name], MD      Brief Narrative:  Mr. Utz is a 72 y.o. M with RA on methotrexate, CKD III, COPD recently started home O2 formerly steroid dependent with Pulmonology, dCHF, hx DVT and PE on Eliquis, AAA and CAD, OSA on BiPAP at night, HTN, and steroid-induced DM who presents with dyspnea on exertion and orthostasis.  He was recently admitted to the hospital twice in July with AKI, hypokalemia and orthostasis from overdiuresis.  After his last discharge, he saw Cardiology, who deferred diuretic dosing to his PCP, who restarted the Lasix about 2 weeks ago, 40 mg BID.  Since then he had progressive weight loss and orthostatic symptoms until the day of admission he was too weak to stand.   Assessment & Plan:  COPD exacerbation On chronic prednisone.  Only oxygen dependence since June. -Continue Solu-Medrol -Continue scheduled and PRN bronchodilators -Continue doxycycline -Continue Mucinex -Continue PPI  Elevated d-dimer Chronic popliteal thrombus VQ scan negative for pulmonary embolism.  Bilateral lower extremity chronic popliteal DVTs present since June [place].     Elevated troponin Low risk nuclear stress in June of this year.  Dyspnea better explained by COPD exacerbation/wheezing.  Doubt ACS, suspect troponin leak/type II NSTEMI in the setting of severe COPD exacerbation.  Chronic diastolic CHF He is hypovolemic. -Hold diuretics  Acute kidney injury on chronic kidney disease stage III IMproving Cr today.  -Hold diuretics -IV fluids  Severe hypokalemia Magnesium repleted, potassium repletion begun. -COntinue K supplementation  History of venous thromboembolism.  Vascular disease -Continue aspirin, atorvastatin  Sleep apnea -Continue CPAP at night  Hypertension Orthostatic here.  Diabetes  -Hold home metformin Glucoses high -Continue SSI -Start Lantus, increase  SSI  Rheumatoid arthritis On Remicade and chronic prednisone -Continue methotrexate   Gout -Continue allopurinol  Hyponatremia Hypovolemic, resolving  Macrocytic anemia Mild -Continue folate and B12       DVT prophylaxis: N/a on therapeutic anticoag Code Status: DO NOT RESUSCITATE Family Communication: None present MDM and disposition Plan: Below labs and imaging studies were reviewed and summarized above.  Medication management as above.  The patient was admitted with a severe COPD flare.  PE ruled out with VQ. Suspect troponin is from type 2 nstemi with severe COPD. He also has a severe enough COPD exacerbation that it has caused cardiac ischemia.  Also has also AKI given over diuresis, as well as severe hypokalemia, requiring ongoing potassium supplementation and close monitoring of electrolytes and renal function      Objective: Vitals:   09/27/17 0722 09/27/17 0926 09/27/17 1423 09/27/17 1658  BP: 118/74   98/60  Pulse: 95 96 100 99  Resp: 20 20 18 20   Temp: 97.6 F (36.4 C)   98.2 F (36.8 C)  TempSrc: Oral   Oral  SpO2: 96% 94% 95% 98%  Weight:      Height:        Intake/Output Summary (Last 24 hours) at 09/27/2017 1800 Last data filed at 09/27/2017 1500 Gross per 24 hour  Intake 1169.84 ml  Output 4 ml  Net 1165.84 ml   Filed Weights   09/26/17 0058 09/26/17 0843  Weight: 103.9 kg 96.4 kg    Examination: BP 98/60   Pulse 99   Temp 98.2 F (36.8 C) (Oral)   Resp 20   Ht 5' 7.01" (1.702 m)   Wt 96.4 kg   SpO2 98%   BMI 33.28  kg/m   General: Healthy, alert and in no distress.  Responds appropriately to questions.  Eye contact, dress and hygiene appropriate. HEENT: Corneas clear, conjunctivae and sclerae normal without injection or icterus, lids and lashes normal.  Visual tracking smooth.  OP moist without erythema, exudates, cobblestoning, or ulcers.  No airway deformities.  Neck supple.   Cardiac: Tachycardic, irregular, no murmurs, JVP  normal, pitting LE edema bilaterally.   Respiratory: Tachypneic, appears dyspneic at rest.  Very wheezy throughout Abdomen: BS present.  No TTP or rebound all quadrants.  No masses or organomegaly.    Extremities: No deformities/injuries.  5/5 grip strength and upper extremity flexion/extension, symmetrically.  Extremities are warm and well-perfused. Neuro: Sensorium intact.  Speech is fluent.  Naming is grossly intact, and the patient's recall, recent and remote, as well as general fund of knowledge seem within normal limits.  Muscle tone normal, without fasciculations.  Moves all extremities equally and with normal coordinatio n but globally extremely weak.  Attention span and concentration are within normal limits.  Psych: Mood "fine", full range of affect.  Normal rate and rhythm of speech.  Thought content appropriate, and thought process linear.  No SI/HI, aural or visual hallucinations or delusions. Attention and concentration are normal.      Data Reviewed: I have personally reviewed following labs and imaging studies:  CBC: Recent Labs  Lab 09/26/17 0100 09/26/17 0436 09/27/17 0248  WBC 13.7* 11.2* 13.1*  NEUTROABS 8.6* 10.2*  --   HGB 12.5* 11.3* 10.4*  HCT 37.8* 34.7* 31.7*  MCV 105.0* 104.8* 105.3*  PLT 230 184 157   Basic Metabolic Panel: Recent Labs  Lab 09/26/17 0100 09/26/17 0240 09/26/17 0436 09/27/17 0248  NA 133*  --  132* 134*  K <2.0* 2.1* 2.2* 3.0*  CL 81*  --  83* 91*  CO2 31  --  31 28  GLUCOSE 289*  --  326* 358*  BUN 105*  --  101* 79*  CREATININE 2.50*  --  2.25* 2.01*  CALCIUM 9.5  --  9.1 9.2  MG  --  2.1  --   --    GFR: Estimated Creatinine Clearance: 36.7 mL/min (A) (by C-G formula based on SCr of 2.01 mg/dL (H)). Liver Function Tests: No results for input(s): AST, ALT, ALKPHOS, BILITOT, PROT, ALBUMIN in the last 168 hours. No results for input(s): LIPASE, AMYLASE in the last 168 hours. No results for input(s): AMMONIA in the last 168  hours. Coagulation Profile: No results for input(s): INR, PROTIME in the last 168 hours. Cardiac Enzymes: Recent Labs  Lab 09/26/17 0707 09/26/17 1305  TROPONINI 0.09* 0.09*   BNP (last 3 results) No results for input(s): PROBNP in the last 8760 hours. HbA1C: No results for input(s): HGBA1C in the last 72 hours. CBG: Recent Labs  Lab 09/26/17 1623 09/26/17 2148 09/27/17 0715 09/27/17 1229 09/27/17 1645  GLUCAP 294* 392* 331* 396* 363*   Lipid Profile: No results for input(s): CHOL, HDL, LDLCALC, TRIG, CHOLHDL, LDLDIRECT in the last 72 hours. Thyroid Function Tests: No results for input(s): TSH, T4TOTAL, FREET4, T3FREE, THYROIDAB in the last 72 hours. Anemia Panel: No results for input(s): VITAMINB12, FOLATE, FERRITIN, TIBC, IRON, RETICCTPCT in the last 72 hours. Urine analysis:    Component Value Date/Time   COLORURINE YELLOW 09/02/2017 1138   APPEARANCEUR HAZY (A) 09/02/2017 1138   LABSPEC <1.005 (L) 09/02/2017 1138   PHURINE 6.0 09/02/2017 1138   GLUCOSEU NEGATIVE 09/02/2017 1138   HGBUR SMALL (A) 09/02/2017  1138   BILIRUBINUR NEGATIVE 09/02/2017 1138   KETONESUR NEGATIVE 09/02/2017 1138   PROTEINUR NEGATIVE 09/02/2017 1138   UROBILINOGEN 0.2 03/28/2007 1330   NITRITE NEGATIVE 09/02/2017 1138   LEUKOCYTESUR NEGATIVE 09/02/2017 1138   Sepsis Labs: @LABRCNTIP (procalcitonin:4,lacticacidven:4)  )No results found for this or any previous visit (from the past 240 hour(s)).       Radiology Studies: Dg Chest 2 View  Result Date: 09/26/2017 CLINICAL DATA:  Shortness of breath for 1 day. EXAM: CHEST - 2 VIEW COMPARISON:  09/12/2017 FINDINGS: Mild cardiac enlargement. Pulmonary vascularity is normal. No airspace disease or consolidation the lungs. No blunting of costophrenic angles. No pneumothorax. Mediastinal contours appear intact. Postoperative changes in the left shoulder. IMPRESSION: Mild cardiac enlargement. No evidence of active pulmonary disease.  Electronically Signed   By: 11/12/2017 M.D.   On: 09/26/2017 01:58   09/28/2017 Renal  Result Date: 09/26/2017 CLINICAL DATA:  Acute renal failure superimposed on stage 3 chronic kidney disease. EXAM: RENAL / URINARY TRACT ULTRASOUND COMPLETE COMPARISON:  None. FINDINGS: Right Kidney: Length: 7.8 cm. Diffuse parenchymal atrophy. No mass or hydronephrosis demonstrated. Left Kidney: Length: 9.1 cm. Diffuse parenchymal atrophy. No mass or hydronephrosis demonstrated. Bladder: No bladder wall thickening or filling defects. Examination is technically limited by patient's body habitus. IMPRESSION: Bilateral renal parenchymal atrophy likely indicating chronic medical renal disease. No hydronephrosis. Bladder is unremarkable. Electronically Signed   By: 09/28/2017 M.D.   On: 09/26/2017 05:16   Nm Pulmonary Perf And Vent  Result Date: 09/26/2017 CLINICAL DATA:  72 year old male with shortness of breath, abnormal D-dimer. EXAM: NUCLEAR MEDICINE VENTILATION - PERFUSION LUNG SCAN TECHNIQUE: Ventilation images were obtained in multiple projections using inhaled aerosol Tc-60m DTPA. Perfusion images were obtained in multiple projections after intravenous injection of Tc-24m-MAA. RADIOPHARMACEUTICALS:  32.5 mCi of Tc-80m DTPA aerosol inhalation and 4.3 mCi Tc11m-MAA IV COMPARISON:  Chest radiographs 0139 hours today. FINDINGS: Ventilation: Artifactual clumping of the radiotracer at the central airways. There is a wedge-shaped right upper lung ventilation defect, although this may be artifact. Perfusion: Homogeneous perfusion radiotracer activity in both lungs with no perfusion defect. IMPRESSION: No perfusion defect, no evidence of pulmonary embolus. Electronically Signed   By: 84m M.D.   On: 09/26/2017 13:42        Scheduled Meds: . allopurinol  100 mg Oral Daily  . apixaban  5 mg Oral BID  . atorvastatin  40 mg Oral q1800  . calcium-vitamin D  1 tablet Oral Q breakfast  . doxycycline  100 mg Oral Q12H   . folic acid  1 mg Oral Daily  . guaiFENesin  1,200 mg Oral BID  . insulin aspart  0-20 Units Subcutaneous TID WC  . insulin aspart  0-5 Units Subcutaneous QHS  . insulin glargine  24 Units Subcutaneous Daily  . ipratropium-albuterol  3 mL Nebulization TID  . methylPREDNISolone (SOLU-MEDROL) injection  40 mg Intravenous Q12H  . mometasone-formoterol  2 puff Inhalation BID  . multivitamin with minerals  1 tablet Oral Daily  . pantoprazole  40 mg Oral BID  . potassium chloride SA  40 mEq Oral BID WC  . sodium chloride flush  3 mL Intravenous Q12H  . vitamin B-12  2,500 mcg Oral Daily   Continuous Infusions:    LOS: 1 day    Time spent: 25 minutes    09/28/2017, MD [facility] 09/27/2017, 6:00 PM     Pager [phone] --- please page though AMION:  [email or web address] Password  [email or web address] If 7PM-7AM, please contact night-coverage

## 2017-09-27 NOTE — Progress Notes (Signed)
Inpatient Diabetes Program Recommendations  AACE/ADA: New Consensus Statement on Inpatient Glycemic Control (2015)  Target Ranges:  Prepandial:   less than 140 mg/dL      Peak postprandial:   less than 180 mg/dL (1-2 hours)      Critically ill patients:  140 - 180 mg/dL   Results for JACE, FERMIN (MRN [id number]) as of 09/27/2017 11:25  Ref. Range 09/26/2017 07:19 09/26/2017 12:54 09/26/2017 16:23 09/26/2017 21:48 09/27/2017 07:15  Glucose-Capillary Latest Ref Range: 70 - 99 mg/dL 676 (H) 195 (H) 093 (H) 392 (H) 331 (H)   Review of Glycemic Control  Diabetes history: DM 2 Outpatient Diabetes medications: Metformin 500 mg BID Current orders for Inpatient glycemic control: Lantus 24 units, Novolog 0-20 units tid, Novolog 0-5 units qhs  Inpatient Diabetes Program Recommendations:    IV Solumedrol 60 mg Q6 hours. Glucose trends in the 300's. If patient remains on current steroid dose. Consider an increase in basal insulin Lantus 28-30 units and add Novolog meal coverage 5 units tid in addition to correction scale if patient consumes at least 50% of meals.  Thanks,  Christena Deem RN, MSN, BC-ADM Inpatient Diabetes Coordinator Team Pager [phone] (8a-5p)

## 2017-09-27 NOTE — Progress Notes (Signed)
*  Preliminary Results* Bilateral lower extremity venous duplex completed. Bilateral lower extremities are positive for deep vein thrombosis in the popliteal vein that appear chronic with partial compressibility .  Right lower extremity is also positive for superficial vein thrombosis in the small saphenous vein.  There is no evidence of Baker's cyst bilaterally.  09/27/2017 2:11 PM Lam Mccubbins Clare Gandy

## 2017-09-27 NOTE — Care Management Note (Addendum)
Case Management Note  Patient Details  Name: Chad Duke MRN: [id number] Date of Birth: Jun 29, 1945  Subjective/Objective:  COPD exac, elevated d dimer, CHF, AKI, hypokalemia                  Action/Plan: Pt is active with [facility]. [place] to follow up on readmission.   09/28/2017 NCM spoke to pt and states he will need taxi voucher home. Contacted [employer] for portable tank for dc home. Pt has oxygen ([employer]), neb machine, and RW. Contacted [employer] for portable tank to be delivered to room for dc. Pt states he may need a taxi to get home. Contacted CSW for transportation.    Expected Discharge Date:                 Expected Discharge Plan:  Home w Home Health Services  In-House Referral:  NA  Discharge planning Services  CM Consult  Post Acute Care Choice:  Home Health, Resumption of Svcs/PTA Provider Choice offered to:  Patient  DME Arranged:  N/A DME Agency:  NA  HH Arranged:  RN, PT, OT, Nurse's Aide, Social Work [employer] Agency:  [facility]  Status of Service:  In process, will continue to follow  If discussed at Long Length of Stay Meetings, dates discussed:    Additional Comments:  Elliot Cousin, RN 09/27/2017, 1:56 PM

## 2017-09-28 LAB — BASIC METABOLIC PANEL
Anion gap: 14 (ref 5–15)
BUN: 65 mg/dL — ABNORMAL HIGH (ref 8–23)
CO2: 25 mmol/L (ref 22–32)
Calcium: 9.5 mg/dL (ref 8.9–10.3)
Chloride: 96 mmol/L — ABNORMAL LOW (ref 98–111)
Creatinine, Ser: 1.68 mg/dL — ABNORMAL HIGH (ref 0.61–1.24)
GFR calc Af Amer: 45 mL/min — ABNORMAL LOW (ref >60)
GFR calc non Af Amer: 39 mL/min — ABNORMAL LOW (ref >60)
Glucose, Bld: 255 mg/dL — ABNORMAL HIGH (ref 70–99)
Potassium: 4.5 mmol/L (ref 3.5–5.1)
Sodium: 135 mmol/L (ref 135–145)

## 2017-09-28 LAB — CBC
HCT: 31 % — ABNORMAL LOW (ref 39.0–52.0)
Hemoglobin: 10.1 g/dL — ABNORMAL LOW (ref 13.0–17.0)
MCH: 34.7 pg — ABNORMAL HIGH (ref 26.0–34.0)
MCHC: 32.6 g/dL (ref 30.0–36.0)
MCV: 106.5 fL — ABNORMAL HIGH (ref 78.0–100.0)
Platelets: 154 10*3/uL (ref 150–400)
RBC: 2.91 MIL/uL — ABNORMAL LOW (ref 4.22–5.81)
RDW: 17.1 % — ABNORMAL HIGH (ref 11.5–15.5)
WBC: 16.9 10*3/uL — ABNORMAL HIGH (ref 4.0–10.5)

## 2017-09-28 LAB — GLUCOSE, CAPILLARY
Glucose-Capillary: 249 mg/dL — ABNORMAL HIGH (ref 70–99)
Glucose-Capillary: 227 mg/dL — ABNORMAL HIGH (ref 70–99)
Glucose-Capillary: 340 mg/dL — ABNORMAL HIGH (ref 70–99)
Glucose-Capillary: 325 mg/dL — ABNORMAL HIGH (ref 70–99)

## 2017-09-28 LAB — VITAMIN B12: Vitamin B-12: 3139 pg/mL — ABNORMAL HIGH (ref 180–914)

## 2017-09-28 MED ORDER — SODIUM CHLORIDE 0.9 % IV SOLN
INTRAVENOUS | Status: AC
  Administered 2017-09-28: 17:00:00 via INTRAVENOUS

## 2017-09-28 NOTE — Progress Notes (Signed)
PROGRESS NOTE    Chad Duke  KKX:[id number] DOB: 1945/11/15 DOA: 09/26/2017 PCP: [name], MD      Brief Narrative:  Chad Duke is a 72 y.o. M with RA on methotrexate, CKD III, COPD recently started home O2 formerly steroid dependent with Pulmonology, dCHF, hx DVT and PE on Eliquis, AAA and CAD, OSA on BiPAP at night, HTN, and steroid-induced DM who presents with dyspnea on exertion and orthostasis.  He was recently admitted to the hospital twice in July with AKI, hypokalemia and orthostasis from overdiuresis.  After his last discharge, he saw Cardiology, who deferred diuretic dosing to his PCP, who restarted the Lasix about 2 weeks ago, 40 mg BID.  Since then he had progressive weight loss and orthostatic symptoms until the day of admission he was too weak to stand.   Assessment & Plan:  COPD exacerbation On chronic prednisone.  Only oxygen dependent since June.  Slight improvement on maximal medical therapy. -Continue IV Solu-Medrol -Continue around-the-clock scheduled bronchodilators, as well as PRN bronchodilators -Continue doxycycline -Continue Mucinex -Continue PPI  Elevated d-dimer Chronic popliteal thrombus VQ scan negative for pulmonary embolism.  Bilateral lower extremity chronic popliteal DVTs present since June [place].    Elevated troponin Low risk nuclear stress in June of this year.  Dyspnea better explained by COPD exacerbation/wheezing.  Doubt ACS, suspect troponin leak/type II NSTEMI in the setting of severe COPD exacerbation.  Chronic diastolic CHF He is hypovolemic. -Hold diuretics  Acute kidney injury on chronic kidney disease stage III  Dehydration Creatinine slightly better today, still not at baseline.  He is still dizzy/weak with standing. -Continue gentle IV fliuds  Severe hypokalemia Magnesium repleted, potassium imporved on multiple daily dosing K.  History of venous thromboembolism.  Vascular disease -Continue aspirin, atorvastatin  Sleep  apnea -Continue CPAP at night  Hypertension Orthostatic here.  Diabetes  -Hold home metformin -Continue SSI -Continue Lantus  Rheumatoid arthritis On Remicade and chronic prednisone -Continue methortrexate  Gout -Continue allopurinol  Hyponatremia Hypovolemic, resolving  Macrocytic anemia Mild -Continue folate, B12       DVT prophylaxis: N/a on therapeutic anticoag Code Status: DO NOT RESUSCITATE Family Communication: None present MDM and disposition Plan: The below labs and imaging reports were viewed and summarized above.  Medication management as above.  The patient was admitted with a severe COPD flare which led to cardiac ischemia as evidenced by elevated troponin.  PE ruled out with VQ.    Also has also AKI given over diuresis, as well as severe hypokalemia, requiring ongoing potassium supplementation and close monitoring of electrolytes and renal function   he is still orthostatic symptomatically, and dyspneic with exertion and so we will continue on IV steroids and IV fluids, reevaluate in the morning.      Objective: Vitals:   09/28/17 0731 09/28/17 0931 09/28/17 0937 09/28/17 1511  BP: 127/85     Pulse: 99 100    Resp: 18 18    Temp: (!) 97.5 F (36.4 C)     TempSrc: Oral     SpO2: 98% 94% 94% 97%  Weight:      Height:        Intake/Output Summary (Last 24 hours) at 09/28/2017 1616 Last data filed at 09/28/2017 1424 Gross per 24 hour  Intake 1020 ml  Output 700 ml  Net 320 ml   Filed Weights   09/26/17 0058 09/26/17 0843 09/28/17 0529  Weight: 103.9 kg 96.4 kg 99 kg    Examination:  General:, Chronically ill-appearing elderly male.  Responds appropriate to questions, eye contact appropriate.  Interactive.  Sitting on the edge of the bed. HEENT: Corneas clear, conjunctival, lids and lashes normal.  No nasal deformity, discharge, or epistaxis.  No oral lesions, oropharynx dry.  Hearing normal. Cardiac: Heart rate normal, rhythm regular,  no murmurs, pitting lower extremity edema, bilaterally to mid shin, chronic venous stasis changes. Respiratory: Dyspneic with exertion, wheezing bilaterally, improved from yesterday. Abdomen: No tenderness to palpation or rebound.  Abdomen soft.    Extremities: No deformities/injuries.  5/5 grip strength and upper extremity flexion/extension, symmetrically.  Extremities are warm and well-perfused. Neuro: Speech is fluent, naming is grossly intact and general fund of knowledge seems to be within normal limits.  Strength is 5-/5 in both upper and lower extremities bilaterally, symmetric.  Extraocular movements intact. Psych: Normal attention, judgment insight appeared normal.  Normal affect.    Data Reviewed: I have personally reviewed following labs and imaging studies:  CBC: Recent Labs  Lab 09/26/17 0100 09/26/17 0436 09/27/17 0248 09/28/17 0325  WBC 13.7* 11.2* 13.1* 16.9*  NEUTROABS 8.6* 10.2*  --   --   HGB 12.5* 11.3* 10.4* 10.1*  HCT 37.8* 34.7* 31.7* 31.0*  MCV 105.0* 104.8* 105.3* 106.5*  PLT 230 184 157 154   Basic Metabolic Panel: Recent Labs  Lab 09/26/17 0100 09/26/17 0240 09/26/17 0436 09/27/17 0248 09/28/17 0325  NA 133*  --  132* 134* 135  K <2.0* 2.1* 2.2* 3.0* 4.5  CL 81*  --  83* 91* 96*  CO2 31  --  31 28 25   GLUCOSE 289*  --  326* 358* 255*  BUN 105*  --  101* 79* 65*  CREATININE 2.50*  --  2.25* 2.01* 1.68*  CALCIUM 9.5  --  9.1 9.2 9.5  MG  --  2.1  --   --   --    GFR: Estimated Creatinine Clearance: 44.6 mL/min (A) (by C-G formula based on SCr of 1.68 mg/dL (H)). Liver Function Tests: No results for input(s): AST, ALT, ALKPHOS, BILITOT, PROT, ALBUMIN in the last 168 hours. No results for input(s): LIPASE, AMYLASE in the last 168 hours. No results for input(s): AMMONIA in the last 168 hours. Coagulation Profile: No results for input(s): INR, PROTIME in the last 168 hours. Cardiac Enzymes: Recent Labs  Lab 09/26/17 0707 09/26/17 1305    TROPONINI 0.09* 0.09*   BNP (last 3 results) No results for input(s): PROBNP in the last 8760 hours. HbA1C: No results for input(s): HGBA1C in the last 72 hours. CBG: Recent Labs  Lab 09/27/17 1229 09/27/17 1645 09/27/17 2159 09/28/17 0731 09/28/17 1214  GLUCAP 396* 363* 355* 249* 227*   Lipid Profile: No results for input(s): CHOL, HDL, LDLCALC, TRIG, CHOLHDL, LDLDIRECT in the last 72 hours. Thyroid Function Tests: No results for input(s): TSH, T4TOTAL, FREET4, T3FREE, THYROIDAB in the last 72 hours. Anemia Panel: Recent Labs    09/28/17 0325  VITAMINB12 3,139*   Urine analysis:    Component Value Date/Time   COLORURINE YELLOW 09/02/2017 1138   APPEARANCEUR HAZY (A) 09/02/2017 1138   LABSPEC <1.005 (L) 09/02/2017 1138   PHURINE 6.0 09/02/2017 1138   GLUCOSEU NEGATIVE 09/02/2017 1138   HGBUR SMALL (A) 09/02/2017 1138   BILIRUBINUR NEGATIVE 09/02/2017 1138   KETONESUR NEGATIVE 09/02/2017 1138   PROTEINUR NEGATIVE 09/02/2017 1138   UROBILINOGEN 0.2 03/28/2007 1330   NITRITE NEGATIVE 09/02/2017 1138   LEUKOCYTESUR NEGATIVE 09/02/2017 1138   Sepsis Labs: @LABRCNTIP (procalcitonin:4,lacticacidven:4)  )  No results found for this or any previous visit (from the past 240 hour(s)).       Radiology Studies: No results found.      Scheduled Meds: . allopurinol  100 mg Oral Daily  . apixaban  5 mg Oral BID  . atorvastatin  40 mg Oral q1800  . calcium-vitamin D  1 tablet Oral Q breakfast  . doxycycline  100 mg Oral Q12H  . folic acid  1 mg Oral Daily  . guaiFENesin  1,200 mg Oral BID  . insulin aspart  0-20 Units Subcutaneous TID WC  . insulin aspart  0-5 Units Subcutaneous QHS  . insulin glargine  24 Units Subcutaneous Daily  . ipratropium-albuterol  3 mL Nebulization TID  . methylPREDNISolone (SOLU-MEDROL) injection  40 mg Intravenous Q12H  . mometasone-formoterol  2 puff Inhalation BID  . multivitamin with minerals  1 tablet Oral Daily  . pantoprazole   40 mg Oral BID  . sodium chloride flush  3 mL Intravenous Q12H  . vitamin B-12  2,500 mcg Oral Daily   Continuous Infusions: . sodium chloride       LOS: 2 days    Time spent: 25 minutes    Alberteen Sam, MD [facility] 09/28/2017, 4:16 PM     Pager [phone] --- please page though AMION:  [email or web address] Password [email or web address] If 7PM-7AM, please contact night-coverage

## 2017-09-28 NOTE — Consult Note (Addendum)
   [facility] Inpatient Consult   09/28/2017  Diem Pagnotta 07/16/1945 [id number]   Patient was recently active with  the [facility] program. Inpatient RNCM made this writer aware of patient.  Contacted Cory with [facility] to confirm and to assess for any issues.  Will follow up if appropriate. For questions, please contact:  Patient confirmed to be active with [facility].   Charlesetta Shanks, RN BSN CCM [facility]  [phone] business mobile phone Toll free office [phone]

## 2017-09-28 NOTE — Care Management Important Message (Signed)
Important Message  Patient Details  Name: Chad Duke MRN: [id number] Date of Birth: Dec 07, 1945   Medicare Important Message Given:  Yes    Elliot Cousin, RN 09/28/2017, 4:13 PM

## 2017-09-28 NOTE — Evaluation (Signed)
Physical Therapy Evaluation Patient Details Name: Chad Duke MRN: [id number] DOB: August 24, 1945 Today's Date: 09/28/2017   History of Present Illness  Pt is a 72 y.o. male admitted 09/26/17 with DOE; worked up for COPD exacerbation. Elevated d-dimer; VQ scan negative for PE; pt with chronic bilateral popliteal DVTs. Of note, recent admission 2x in July for AKI, hypokalemia, and orthostasis from overdiuresis. PMH includes RA, CKDIII, COPD (on home O2), CHF, DVT and PE, AAA, CAD, OSA, HTN, DM.    Clinical Impression  Pt presents with an overall decrease in functional mobility secondary to above. PTA, pt lives alone, ambulating short distances with rollator; currently receiving HH services, including PT. Today, pt able to amb with RW and supervision; limited by generalized weakness and decreased activity tolerance. SpO2 >90% on 1L O2 [place]. Pt would benefit from continued acute PT services to maximize functional mobility and independence prior to d/c with continued HHPT services.     Follow Up Recommendations Home health PT;Supervision - Intermittent(active with [facility])    Equipment Recommendations  None recommended by PT    Recommendations for Other Services       Precautions / Restrictions Precautions Precautions: Fall Restrictions Weight Bearing Restrictions: No      Mobility  Bed Mobility               General bed mobility comments: Received sitting EOB  Transfers Overall transfer level: Needs assistance Equipment used: Rolling walker (2 wheeled) Transfers: Sit to/from Stand Sit to Stand: Supervision            Ambulation/Gait Ambulation/Gait assistance: Supervision Gait Distance (Feet): 120 Feet Assistive device: Rolling walker (2 wheeled) Gait Pattern/deviations: Step-through pattern;Decreased stride length;Wide base of support Gait velocity: Decreased Gait velocity interpretation: 1.31 - 2.62 ft/sec, indicative of limited community ambulator General  Gait Details: Slow, steady amb with RW and supervision for balance; pt c/o knees feeling weak. SpO2 >90% on 1L O2 [place]  Stairs            Wheelchair Mobility    Modified Rankin (Stroke Patients Only)       Balance Overall balance assessment: Needs assistance   Sitting balance-Leahy Scale: Good       Standing balance-Leahy Scale: Poor Standing balance comment: Reliant on UE support                             Pertinent Vitals/Pain Pain Assessment: No/denies pain    Home Living Family/patient expects to be discharged to:: Private residence Living Arrangements: Alone Available Help at Discharge: Neighbor;Available PRN/intermittently Type of Home: Apartment Home Access: Level entry     Home Layout: One level Home Equipment: Toilet riser;Cane - single point;Walker - 4 wheels;Shower seat;Adaptive equipment Additional Comments: Currently receiving HHPT/OT/RN/aide through [facility]    Prior Function Level of Independence: Needs assistance   Gait / Transfers Assistance Needed: Reports mod indep ambulating short distances with rollator; electric scooter when grocery shopping. Continues to drive, but not working right now.   ADL's / Homemaking Assistance Needed: Assist from aide for bathing; requires assistance for LB dressing        Hand Dominance   Dominant Hand: Right    Extremity/Trunk Assessment   Upper Extremity Assessment Upper Extremity Assessment: Generalized weakness    Lower Extremity Assessment Lower Extremity Assessment: Generalized weakness       Communication   Communication: HOH  Cognition Arousal/Alertness: Awake/alert Behavior During Therapy: WFL for  tasks assessed/performed Overall Cognitive Status: No family/caregiver present to determine baseline cognitive functioning Area of Impairment: Safety/judgement                         Safety/Judgement: Decreased awareness of deficits;Decreased awareness  of safety     General Comments: Poor insight into condition/deficits      General Comments      Exercises     Assessment/Plan    PT Assessment Patient needs continued PT services  PT Problem List Decreased strength;Decreased activity tolerance;Decreased balance;Decreased mobility;Cardiopulmonary status limiting activity       PT Treatment Interventions DME instruction;Gait training;Stair training;Functional mobility training;Therapeutic activities;Therapeutic exercise;Balance training;Patient/family education    PT Goals (Current goals can be found in the Care Plan section)  Acute Rehab PT Goals Patient Stated Goal: Get well and return home PT Goal Formulation: With patient Time For Goal Achievement: 10/12/17 Potential to Achieve Goals: Good    Frequency Min 3X/week   Barriers to discharge Decreased caregiver support      Co-evaluation               AM-PAC PT "6 Clicks" Daily Activity  Outcome Measure Difficulty turning over in bed (including adjusting bedclothes, sheets and blankets)?: None Difficulty moving from lying on back to sitting on the side of the bed? : None Difficulty sitting down on and standing up from a chair with arms (e.g., wheelchair, bedside commode, etc,.)?: A Little Help needed moving to and from a bed to chair (including a wheelchair)?: A Little Help needed walking in hospital room?: A Little Help needed climbing 3-5 steps with a railing? : A Lot 6 Click Score: 19    End of Session Equipment Utilized During Treatment: Gait belt;Oxygen Activity Tolerance: Patient tolerated treatment well Patient left: with call bell/phone within reach(seated EOB) Nurse Communication: Mobility status PT Visit Diagnosis: Other abnormalities of gait and mobility (R26.89);Muscle weakness (generalized) (M62.81)    Time: 6301-6010 PT Time Calculation (min) (ACUTE ONLY): 17 min   Charges:   PT Evaluation $PT Eval Moderate Complexity: 1 Mod          Ina Homes, PT, DPT Acute Rehab Services  Pager: [phone]  Malachy Chamber 09/28/2017, 11:18 AM

## 2017-09-29 ENCOUNTER — Ambulatory Visit: Payer: Medicare Other | Admitting: Podiatry

## 2017-09-29 LAB — BASIC METABOLIC PANEL
Anion gap: 8 (ref 5–15)
BUN: 59 mg/dL — ABNORMAL HIGH (ref 8–23)
CO2: 26 mmol/L (ref 22–32)
Calcium: 9.1 mg/dL (ref 8.9–10.3)
Chloride: 100 mmol/L (ref 98–111)
Creatinine, Ser: 1.52 mg/dL — ABNORMAL HIGH (ref 0.61–1.24)
GFR calc Af Amer: 51 mL/min — ABNORMAL LOW (ref >60)
GFR calc non Af Amer: 44 mL/min — ABNORMAL LOW (ref >60)
Glucose, Bld: 216 mg/dL — ABNORMAL HIGH (ref 70–99)
Potassium: 5.2 mmol/L — ABNORMAL HIGH (ref 3.5–5.1)
Sodium: 134 mmol/L — ABNORMAL LOW (ref 135–145)

## 2017-09-29 LAB — CBC
HCT: 30.5 % — ABNORMAL LOW (ref 39.0–52.0)
Hemoglobin: 9.9 g/dL — ABNORMAL LOW (ref 13.0–17.0)
MCH: 35 pg — ABNORMAL HIGH (ref 26.0–34.0)
MCHC: 32.5 g/dL (ref 30.0–36.0)
MCV: 107.8 fL — ABNORMAL HIGH (ref 78.0–100.0)
Platelets: 138 10*3/uL — ABNORMAL LOW (ref 150–400)
RBC: 2.83 MIL/uL — ABNORMAL LOW (ref 4.22–5.81)
RDW: 17.2 % — ABNORMAL HIGH (ref 11.5–15.5)
WBC: 14.3 10*3/uL — ABNORMAL HIGH (ref 4.0–10.5)

## 2017-09-29 LAB — GLUCOSE, CAPILLARY
Glucose-Capillary: 215 mg/dL — ABNORMAL HIGH (ref 70–99)
Glucose-Capillary: 226 mg/dL — ABNORMAL HIGH (ref 70–99)

## 2017-09-29 MED ORDER — PREDNISONE 10 MG (21) PO TBPK: ORAL_TABLET | ORAL | 0 refills | Status: DC

## 2017-09-29 MED ORDER — INSULIN GLARGINE 100 UNIT/ML ~~LOC~~ SOLN: 24.0000 [IU] | Freq: Every day | SUBCUTANEOUS | 11 refills | Status: DC

## 2017-09-29 MED ORDER — BLOOD GLUCOSE METER KIT: PACK | 0 refills | Status: DC

## 2017-09-29 MED ORDER — DOXYCYCLINE HYCLATE 100 MG PO TABS: 100.0000 mg | ORAL_TABLET | Freq: Two times a day (BID) | ORAL | 0 refills | Status: DC

## 2017-09-29 NOTE — Progress Notes (Signed)
Pt seen by MD, orders written for discharge. RN went over discharge instructions with pt and answered all questions. RN removed IV's. Escorted for discharge via wheelchair with all belongings. Pt will follow up outpatient with MD.  

## 2017-09-29 NOTE — Discharge Summary (Signed)
Physician Discharge Summary  Chad Duke WOE:[id number] DOB: 30-Aug-1945 DOA: 09/26/2017  PCP: [name], MD  Admit date: 09/26/2017 Discharge date: 09/29/2017  Admitted From: Home  Disposition:  Home with home health   Recommendations for Outpatient Follow-up:  1. Follow up with PCP in 1 week 2. Please obtain BMP in one week to recheck potassium and creatinine 3. Please reduce Lantus insulin if no longer needed, and restart metformin if creatinine stable 4. Follow up with Rheumatology re: methotrexate as scheduled on the 18th      Home Health: [facility]  Equipment/Devices: Per [facility]  Discharge Condition: Fair  CODE STATUS: DO NOT RESUSCITATE Diet recommendation: Diabetic  Brief/Interim Summary: Mr. Mizzell is a 72 y.o. M with RA on methotrexate, CKD III baseline Cr 1.3, COPD recently started home O2 formerly steroid dependent with Pulmonology, dCHF, hx DVT and PE on Eliquis, AAA and CAD, OSA on BiPAP at night, HTN, and steroid-induced DM who presents with dyspnea on exertion and orthostasis.  He was recently admitted to the hospital twice in July with AKI, hypokalemia and orthostasis from overdiuresis.  After his last discharge, he saw Cardiology, who deferred diuretic dosing to his PCP, who restarted the Lasix about 2 weeks ago, 40 mg BID.  Since then he had progressive weight loss and orthostatic symptoms until the day of admission he was too weak to stand, came in and was found to be hypovolemic, orthostatic and hypokalemic.  Also to be wheezing and with COPD exacerbation.         Discharge Diagnoses:   COPD exacerbation It appears he has been on and off chronic prednisone.  Only oxygen dependent since June.  He was started on IV Solu-Medrol and doxycycline.  He improved and was able to ambulate with less dyspnea.      Elevated d-dimer Chronic popliteal thrombus D-Dimer was positive on admission.  VQ scan negative for pulmonary embolism.  Bilateral lower  extremity US was obtained that showed same chronic popliteal thrombus present since June [place].    Elevated troponin Troponin elevation, low and flat.  He had a low risk nuclear stress in June of this year.  His dyspnea was better explained by COPD exacerbation/wheezing, and I strongly doubt ACS, suspect troponin leak/type II NSTEMI in the setting of severe COPD exacerbation.  Chronic diastolic CHF His leg swelling appears to be from venous insufficiency in the setting of chronic popliteal thrombus bilaterally.  He was orthostatic and hypovolemic on admission.  Diuresis should be approached very cautiously in this patient.   No Lasix on discharge.  Acute kidney injury on chronic kidney disease stage III  Creatinine trended down to 1.38 mg/dL at discharge.    Severe hypokalemia Magnesium repleted, potassium imporved on multiple daily dosing K.  Vascular disease  Sleep apnea  Hypertension  Diabetes  Metformin held on discharge due to AKI.  Started on Lantus 24 units nightly due to steroid-induced hyperglycemia.  Will need close follow up with PCP.  Rheumatoid arthritis His methotrexate is currently on hold until he sees his Rheumatologist in Sept.  Gout  Hyponatremia Hypovolemic, resolved.  Macrocytic anemia       Discharge Instructions  Discharge Instructions    Diet Carb Modified   Complete by:  As directed    Discharge instructions   Complete by:  As directed    From Dr. Maryfrances Bunnell: You were admitted for trouble breathing from a COPD exacerbation, as well as kidney injury from dehydration.  For the COPD flare,  you were treated with steroids and antibiotics: Continue steroids with the following taper:  Take prednisone 60 mg (6 tabs), then Take prednisone 50 mg (5 tabs), then Take prednisone 40 mg (4 tabs), then Take prednisone 30 mg (3 tabs), then Take prednisone 20 mg (2 tabs), then Take prednisone 10 mg (1 tab), then stop  Use albuterol as needed up to  every 4 hours Take the last of your antibiotics for the COPD flare (doxycycline 100 mg twice daily) Call Dr. Chase Caller office for a follow up appointment.  Schedule it in about a week (around when you finish prednisone)     For the kidney injury and dehydration: This was caused by too much Lasix Do not take Lasix Have Dr. Wynelle Link check your kidney function in 1 week Your leg swelling is from bad veins and from chronic old clot in your calf veins, not from CHF.  The right treatment is to continue your blood thinner and to use compression hose as much as you are able.    For diabetes: Stop metformin for now When Dr. Wynelle Link rechecks your kidney function, if it is still normal, you can restart, or he can suggest an alternative While you are taking prednisone and your sugars are high: Check your sugar every morning before breakfast Take Lantus insulin 24 units each morning If your morning fasting sugar (before breakfast) stays OVER 200 mg/dL: tell Dr. Wynelle Link when you see him in a week  Watch your sugars closely when you taper prednisone. If your morning sugars are LESS THAN 100 mg/dL, stop taking Lantus and schedule a follow up appointment with Dr. Wynelle Link   Increase activity slowly   Complete by:  As directed      Allergies as of 09/29/2017      Reactions   Chantix [varenicline] Other (See Comments)   Suicidal thoughts      Medication List    STOP taking these medications   furosemide 40 MG tablet Commonly known as:  LASIX   predniSONE 10 MG tablet Commonly known as:  DELTASONE Replaced by:  predniSONE 10 MG (21) Tbpk tablet     TAKE these medications   albuterol 108 (90 Base) MCG/ACT inhaler Commonly known as:  PROVENTIL HFA;VENTOLIN HFA Inhale 2 puffs into the lungs every 4 (four) hours as needed for wheezing or shortness of breath.   allopurinol 100 MG tablet Commonly known as:  ZYLOPRIM Take 1 tablet (100 mg total) by mouth daily.   apixaban 5 MG Tabs tablet Commonly known as:   ELIQUIS Take 1 tablet (5 mg total) by mouth 2 (two) times daily.   atorvastatin 40 MG tablet Commonly known as:  LIPITOR Take 1 tablet (40 mg total) by mouth daily.   cetirizine 10 MG tablet Commonly known as:  ZYRTEC Take 10 mg by mouth daily.   CITRACAL CALCIUM+D 600-40-500 MG-MG-UNIT Tb24 Generic drug:  Calcium-Magnesium-Vitamin D Take 1 tablet by mouth daily.   cyanocobalamin 2000 MCG tablet Take 2,500 mcg by mouth daily.   doxycycline 100 MG tablet Commonly known as:  VIBRA-TABS Take 1 tablet (100 mg total) by mouth every 12 (twelve) hours.   FLUTTER Devi Use as directed   folic acid 400 MCG tablet Commonly known as:  FOLVITE Take 1,200 mcg by mouth daily.   guaiFENesin 600 MG 12 hr tablet Commonly known as:  MUCINEX Take 1,200 mg by mouth 2 (two) times daily.   insulin glargine 100 UNIT/ML injection Commonly known as:  LANTUS Inject 0.24 mLs (  24 Units total) into the skin daily.   ipratropium-albuterol 0.5-2.5 (3) MG/3ML Soln Commonly known as:  DUONEB Take 3 mLs by nebulization every 6 (six) hours.   methotrexate 2.5 MG tablet Commonly known as:  RHEUMATREX Take 4 tablets (10 mg total) by mouth once a week. Please hold methotrexate for now, please follow up with Dr Dierdre Forth for further instruction regarding methotrazate.   mometasone-formoterol 200-5 MCG/ACT Aero Commonly known as:  DULERA Inhale 2 puffs into the lungs 2 (two) times daily.   multivitamin tablet Take 1 tablet by mouth daily.   OXYGEN 2 L. BIPAP with 1lpm o2   pantoprazole 40 MG tablet Commonly known as:  PROTONIX Take 40 mg by mouth 2 (two) times daily.   potassium chloride 10 MEQ CR capsule Commonly known as:  MICRO-K Take 1 capsule (10 mEq total) by mouth daily.   predniSONE 10 MG (21) Tbpk tablet Commonly known as:  STERAPRED UNI-PAK 21 TAB Take 6 day taper as outlined in instructions. Replaces:  predniSONE 10 MG tablet   sodium chloride 0.65 % Soln nasal spray Commonly  known as:  OCEAN Place 1 spray into both nostrils daily as needed for congestion.   Tiotropium Bromide Monohydrate 2.5 MCG/ACT Aers Inhale 2 puffs into the lungs daily.   vitamin C 1000 MG tablet Take 1,000 mg by mouth daily.      Follow-up Information    [facility] Follow up.   Specialty:  Home Health Services Why:  Home Health RN, Physical Therapy, Occuptional Therapy and aide-agency will call to arrange initial appointment Contact information: [address] [place] [address] [phone]        [name], MD Follow up.   Specialty:  Internal Medicine Contact information: [address] [place] [address] [phone]          Allergies  Allergen Reactions  . Chantix [Varenicline] Other (See Comments)    Suicidal thoughts    Consultations:  None   Procedures/Studies: Dg Chest 2 View  Result Date: 09/26/2017 CLINICAL DATA:  Shortness of breath for 1 day. EXAM: CHEST - 2 VIEW COMPARISON:  09/12/2017 FINDINGS: Mild cardiac enlargement. Pulmonary vascularity is normal. No airspace disease or consolidation the lungs. No blunting of costophrenic angles. No pneumothorax. Mediastinal contours appear intact. Postoperative changes in the left shoulder. IMPRESSION: Mild cardiac enlargement. No evidence of active pulmonary disease. Electronically Signed   By: Burman Nieves M.D.   On: 09/26/2017 01:58   Dg Chest 2 View  Result Date: 09/12/2017 CLINICAL DATA:  Pt c/o of cp and sob for 3 to 4 weeks. Pt has hx of COPD, AAA, HTN. EXAM: CHEST - 2 VIEW COMPARISON:  09/02/2017 FINDINGS: The lungs are hyperinflated. Heart size is accentuated by AP technique. There is patchy infiltrate in the RIGHT UPPER lobe, consistent with early infiltrate. Trace bilateral pleural effusions. No pulmonary edema. IMPRESSION: RIGHT UPPER lobe infiltrate. Followup PA and lateral chest X-ray is recommended in 3-4 weeks following trial of antibiotic therapy to  ensure resolution and exclude underlying malignancy. Small bilateral effusions. Electronically Signed   By: Norva Pavlov M.D.   On: 09/12/2017 13:27   Ct Chest Wo Contrast  Result Date: 09/12/2017 CLINICAL DATA:  Dyspnea and shortness of breath. COPD. Right upper lobe opacity on chest radiograph. EXAM: CT CHEST WITHOUT CONTRAST TECHNIQUE: Multidetector CT imaging of the chest was performed following the standard protocol without IV contrast. COMPARISON:  Chest radiograph on 09/12/2017 and prior chest CTA on  07/20/2017 FINDINGS: Cardiovascular: No acute findings. Aortic and coronary artery atherosclerosis. Mediastinum/Nodes: No masses or pathologically enlarged lymph nodes identified on this unenhanced exam. Lungs/Pleura: New linear opacity in the posterior right upper and middle lobes is consistent with subsegmental atelectasis. No evidence of central endobronchial obstruction. No evidence of pleural effusion. A new spiculated nodular density is seen in the posterior left lower lobe with a central focus of cavitation. This measures 16 x 10 mm on image 108/5, and is consistent with a resolving pulmonary infarct or infectious or inflammatory process given its short time interval for appearance, and the left lower lobe pulmonary embolism seen on recent chest CTA. No other suspicious pulmonary nodules or masses are identified. Upper Abdomen:  Unremarkable. Musculoskeletal: No suspicious bone lesions. Left shoulder prosthesis in appropriate position. IMPRESSION: New subsegmental atelectasis in posterior right upper and middle lobes. No evidence of central endobronchial obstruction. New 1.6 cm spiculated nodule with central cavitation in the posterior left lower lobe since most recent exam on 07/20/2017. This most likely represents a resolving pulmonary infarct given its short interval development and recent left lower lobe pulmonary embolism. Other considerations include infectious or inflammatory etiologies.  Recommend continued follow-up by chest CT in 3 months. Aortic Atherosclerosis (ICD10-I70.0). Coronary artery calcification. Electronically Signed   By: Myles Rosenthal M.D.   On: 09/12/2017 15:05   US Renal  Result Date: 09/26/2017 CLINICAL DATA:  Acute renal failure superimposed on stage 3 chronic kidney disease. EXAM: RENAL / URINARY TRACT ULTRASOUND COMPLETE COMPARISON:  None. FINDINGS: Right Kidney: Length: 7.8 cm. Diffuse parenchymal atrophy. No mass or hydronephrosis demonstrated. Left Kidney: Length: 9.1 cm. Diffuse parenchymal atrophy. No mass or hydronephrosis demonstrated. Bladder: No bladder wall thickening or filling defects. Examination is technically limited by patient's body habitus. IMPRESSION: Bilateral renal parenchymal atrophy likely indicating chronic medical renal disease. No hydronephrosis. Bladder is unremarkable. Electronically Signed   By: Burman Nieves M.D.   On: 09/26/2017 05:16   Nm Pulmonary Perf And Vent  Result Date: 09/26/2017 CLINICAL DATA:  72 year old male with shortness of breath, abnormal D-dimer. EXAM: NUCLEAR MEDICINE VENTILATION - PERFUSION LUNG SCAN TECHNIQUE: Ventilation images were obtained in multiple projections using inhaled aerosol Tc-69m DTPA. Perfusion images were obtained in multiple projections after intravenous injection of Tc-61m-MAA. RADIOPHARMACEUTICALS:  32.5 mCi of Tc-9m DTPA aerosol inhalation and 4.3 mCi Tc72m-MAA IV COMPARISON:  Chest radiographs 0139 hours today. FINDINGS: Ventilation: Artifactual clumping of the radiotracer at the central airways. There is a wedge-shaped right upper lung ventilation defect, although this may be artifact. Perfusion: Homogeneous perfusion radiotracer activity in both lungs with no perfusion defect. IMPRESSION: No perfusion defect, no evidence of pulmonary embolus. Electronically Signed   By: Odessa Fleming M.D.   On: 09/26/2017 13:42   Dg Chest Port 1 View  Result Date: 09/02/2017 CLINICAL DATA:  Shortness of breath  EXAM: PORTABLE CHEST 1 VIEW COMPARISON:  08/17/2017 FINDINGS: Cardiac shadow is stable. Lungs are well aerated bilaterally. Previously seen nodular density over the left lung base is no longer identified. Some mild atelectatic changes are noted bilaterally. No acute bony abnormality is seen. Postsurgical changes in left shoulder are noted. IMPRESSION: Mild bibasilar atelectasis. Electronically Signed   By: Alcide Clever M.D.   On: 09/02/2017 09:40   US Abdomen Limited Ruq  Result Date: 09/04/2017 CLINICAL DATA:  Elevated liver function studies.  Increased BMI. EXAM: ULTRASOUND ABDOMEN LIMITED RIGHT UPPER QUADRANT COMPARISON:  None. FINDINGS: Gallbladder: Gallbladder is somewhat contracted, likely physiologic. No stones, sludge, or gallbladder  wall thickening. Murphy's sign is negative. Common bile duct: Diameter: 2.9 mm, normal Liver: Diffusely increased hepatic parenchymal echotexture consistent with diffuse fatty infiltration. No focal lesions identified. Poor penetration due to fatty infiltration limits examination. Portal vein is patent on color Doppler imaging with normal direction of blood flow towards the liver. IMPRESSION: No evidence of cholelithiasis or cholecystitis. Fatty infiltration of the liver. Electronically Signed   By: Burman Nieves M.D.   On: 09/04/2017 06:29      Subjective: Feeling better.  No dyspnea, no orthopnea, no confusion, fever.  Discharge Exam: Vitals:   09/29/17 0738 09/29/17 0754  BP:  (!) 143/89  Pulse:  (!) 105  Resp:    Temp:  98.7 F (37.1 C)  SpO2: 98% 96%   Vitals:   09/28/17 2016 09/28/17 2314 09/29/17 0738 09/29/17 0754  BP:  124/77  (!) 143/89  Pulse:  97  (!) 105  Resp:  19    Temp:  97.8 F (36.6 C)  98.7 F (37.1 C)  TempSrc:  Oral  Oral  SpO2: 100% 100% 98% 96%  Weight:      Height:        General: Pt is alert, awake, not in acute distress, lying in bed, interactive Cardiovascular: RRR, S1/S2 +, no rubs, no gallops Respiratory: CTA  bilaterally, mild wheezing bilaterally. Abdominal: Soft, NT, ND, bowel sounds + Extremities: no edema, no cyanosis    The results of significant diagnostics from this hospitalization (including imaging, microbiology, ancillary and laboratory) are listed below for reference.     Microbiology: No results found for this or any previous visit (from the past 240 hour(s)).   Labs: BNP (last 3 results) Recent Labs    09/02/17 0907 09/12/17 1210 09/26/17 0102  BNP 60.2 63.5 98.2   Basic Metabolic Panel: Recent Labs  Lab 09/26/17 0100 09/26/17 0240 09/26/17 0436 09/27/17 0248 09/28/17 0325 09/29/17 0335  NA 133*  --  132* 134* 135 134*  K <2.0* 2.1* 2.2* 3.0* 4.5 5.2*  CL 81*  --  83* 91* 96* 100  CO2 31  --  31 28 25 26   GLUCOSE 289*  --  326* 358* 255* 216*  BUN 105*  --  101* 79* 65* 59*  CREATININE 2.50*  --  2.25* 2.01* 1.68* 1.52*  CALCIUM 9.5  --  9.1 9.2 9.5 9.1  MG  --  2.1  --   --   --   --    Liver Function Tests: No results for input(s): AST, ALT, ALKPHOS, BILITOT, PROT, ALBUMIN in the last 168 hours. No results for input(s): LIPASE, AMYLASE in the last 168 hours. No results for input(s): AMMONIA in the last 168 hours. CBC: Recent Labs  Lab 09/26/17 0100 09/26/17 0436 09/27/17 0248 09/28/17 0325 09/29/17 0335  WBC 13.7* 11.2* 13.1* 16.9* 14.3*  NEUTROABS 8.6* 10.2*  --   --   --   HGB 12.5* 11.3* 10.4* 10.1* 9.9*  HCT 37.8* 34.7* 31.7* 31.0* 30.5*  MCV 105.0* 104.8* 105.3* 106.5* 107.8*  PLT 230 184 157 154 138*   Cardiac Enzymes: Recent Labs  Lab 09/26/17 0707 09/26/17 1305  TROPONINI 0.09* 0.09*   BNP: Invalid input(s): POCBNP CBG: Recent Labs  Lab 09/28/17 0731 09/28/17 1214 09/28/17 1624 09/28/17 2122 09/29/17 0753  GLUCAP 249* 227* 340* 325* 215*   D-Dimer No results for input(s): DDIMER in the last 72 hours. Hgb A1c No results for input(s): HGBA1C in the last 72 hours. Lipid Profile No results  for input(s): CHOL, HDL,  LDLCALC, TRIG, CHOLHDL, LDLDIRECT in the last 72 hours. Thyroid function studies No results for input(s): TSH, T4TOTAL, T3FREE, THYROIDAB in the last 72 hours.  Invalid input(s): FREET3 Anemia work up Recent Labs    09/28/17 0325  VITAMINB12 3,139*   Urinalysis    Component Value Date/Time   COLORURINE YELLOW 09/02/2017 1138   APPEARANCEUR HAZY (A) 09/02/2017 1138   LABSPEC <1.005 (L) 09/02/2017 1138   PHURINE 6.0 09/02/2017 1138   GLUCOSEU NEGATIVE 09/02/2017 1138   HGBUR SMALL (A) 09/02/2017 1138   BILIRUBINUR NEGATIVE 09/02/2017 1138   KETONESUR NEGATIVE 09/02/2017 1138   PROTEINUR NEGATIVE 09/02/2017 1138   UROBILINOGEN 0.2 03/28/2007 1330   NITRITE NEGATIVE 09/02/2017 1138   LEUKOCYTESUR NEGATIVE 09/02/2017 1138   Sepsis Labs Invalid input(s): PROCALCITONIN,  WBC,  LACTICIDVEN Microbiology No results found for this or any previous visit (from the past 240 hour(s)).   Time coordinating discharge: 25 minutes       SIGNED:   Alberteen Sam, MD  [facility] 09/29/2017, 9:41 AM

## 2017-09-30 DIAGNOSIS — I130 Hypertensive heart and chronic kidney disease with heart failure and stage 1 through stage 4 chronic kidney disease, or unspecified chronic kidney disease: Secondary | ICD-10-CM | POA: Diagnosis not present

## 2017-09-30 DIAGNOSIS — N179 Acute kidney failure, unspecified: Secondary | ICD-10-CM | POA: Diagnosis not present

## 2017-10-01 ENCOUNTER — Telehealth: Payer: Self-pay | Admitting: Cardiology

## 2017-10-01 ENCOUNTER — Inpatient Hospital Stay: Payer: Medicare Other | Admitting: Internal Medicine

## 2017-10-01 DIAGNOSIS — I130 Hypertensive heart and chronic kidney disease with heart failure and stage 1 through stage 4 chronic kidney disease, or unspecified chronic kidney disease: Secondary | ICD-10-CM | POA: Diagnosis not present

## 2017-10-01 DIAGNOSIS — N179 Acute kidney failure, unspecified: Secondary | ICD-10-CM | POA: Diagnosis not present

## 2017-10-01 NOTE — Telephone Encounter (Signed)
Spoke to [facility], occupational therapist  reporting that the patient at rest has a heart rate of 116- 118 prior to therapy today. Patient has no complaints.patient is dependent on oxygen 2 liters.  No blood pressure given, last weight  Today was 218 ,  192  09/30/17  , 210 lbs 09/24/17,  222.6 lbs 09/17/17,  229 lbs  09/10/17.  last time patient in office 09/08/17 weight was  229 lbs.   Therapist states there will be one more visit next week for therapy. RN informed  Roe Coombs  The patient heart rate was @ the same at last office visit.  The question of  diuretic was deferred to primary. Will send  Information to cardiology to if any further orders are need other than to continue to monitor for increase symptoms

## 2017-10-01 NOTE — Telephone Encounter (Signed)
New Message:   Roe Coombs Occupational therapist is calling due to patient heart rate

## 2017-10-01 NOTE — Telephone Encounter (Signed)
Ok to monitor symptoms.  Dr. Rexene Edison

## 2017-10-01 NOTE — Telephone Encounter (Signed)
LEFT MESSAGE TO CONTINUE TO MONITOR  CALLED PATIENT - UNABLE TO LEAVE MESSAGE - VOICEMAIL IS FULL.

## 2017-10-02 ENCOUNTER — Other Ambulatory Visit: Payer: Self-pay

## 2017-10-02 ENCOUNTER — Emergency Department (HOSPITAL_COMMUNITY)
Admission: EM | Admit: 2017-10-02 | Discharge: 2017-10-03 | Disposition: A | Discharge status: Home / Self Care | Payer: Medicare Other | Attending: Emergency Medicine | Admitting: Emergency Medicine

## 2017-10-02 DIAGNOSIS — I2510 Atherosclerotic heart disease of native coronary artery without angina pectoris: Secondary | ICD-10-CM | POA: Diagnosis not present

## 2017-10-02 DIAGNOSIS — I5032 Chronic diastolic (congestive) heart failure: Secondary | ICD-10-CM | POA: Diagnosis not present

## 2017-10-02 DIAGNOSIS — R627 Adult failure to thrive: Secondary | ICD-10-CM | POA: Diagnosis not present

## 2017-10-02 DIAGNOSIS — R531 Weakness: Secondary | ICD-10-CM | POA: Diagnosis not present

## 2017-10-02 DIAGNOSIS — Z794 Long term (current) use of insulin: Secondary | ICD-10-CM | POA: Diagnosis not present

## 2017-10-02 DIAGNOSIS — Z96612 Presence of left artificial shoulder joint: Secondary | ICD-10-CM | POA: Diagnosis not present

## 2017-10-02 DIAGNOSIS — Z96642 Presence of left artificial hip joint: Secondary | ICD-10-CM | POA: Insufficient documentation

## 2017-10-02 DIAGNOSIS — E119 Type 2 diabetes mellitus without complications: Secondary | ICD-10-CM | POA: Insufficient documentation

## 2017-10-02 DIAGNOSIS — Z79899 Other long term (current) drug therapy: Secondary | ICD-10-CM | POA: Insufficient documentation

## 2017-10-02 DIAGNOSIS — I110 Hypertensive heart disease with heart failure: Secondary | ICD-10-CM | POA: Insufficient documentation

## 2017-10-02 DIAGNOSIS — K5900 Constipation, unspecified: Secondary | ICD-10-CM | POA: Diagnosis not present

## 2017-10-02 DIAGNOSIS — J449 Chronic obstructive pulmonary disease, unspecified: Secondary | ICD-10-CM | POA: Diagnosis not present

## 2017-10-02 DIAGNOSIS — Z87891 Personal history of nicotine dependence: Secondary | ICD-10-CM | POA: Insufficient documentation

## 2017-10-02 DIAGNOSIS — Z7901 Long term (current) use of anticoagulants: Secondary | ICD-10-CM | POA: Diagnosis not present

## 2017-10-02 DIAGNOSIS — N179 Acute kidney failure, unspecified: Secondary | ICD-10-CM | POA: Diagnosis not present

## 2017-10-02 DIAGNOSIS — I130 Hypertensive heart and chronic kidney disease with heart failure and stage 1 through stage 4 chronic kidney disease, or unspecified chronic kidney disease: Secondary | ICD-10-CM | POA: Diagnosis not present

## 2017-10-03 ENCOUNTER — Encounter (HOSPITAL_COMMUNITY): Payer: Self-pay | Admitting: Student

## 2017-10-03 ENCOUNTER — Encounter (HOSPITAL_COMMUNITY): Payer: Self-pay | Admitting: *Deleted

## 2017-10-03 ENCOUNTER — Emergency Department (EMERGENCY_DEPARTMENT_HOSPITAL)
Admission: EM | Admit: 2017-10-03 | Discharge: 2017-10-04 | Payer: Medicare Other | Source: Home / Self Care | Attending: Emergency Medicine | Admitting: Emergency Medicine

## 2017-10-03 ENCOUNTER — Other Ambulatory Visit: Payer: Self-pay

## 2017-10-03 DIAGNOSIS — R32 Unspecified urinary incontinence: Secondary | ICD-10-CM | POA: Diagnosis present

## 2017-10-03 DIAGNOSIS — Y9389 Activity, other specified: Secondary | ICD-10-CM | POA: Insufficient documentation

## 2017-10-03 DIAGNOSIS — Y999 Unspecified external cause status: Secondary | ICD-10-CM

## 2017-10-03 DIAGNOSIS — Z79899 Other long term (current) drug therapy: Secondary | ICD-10-CM

## 2017-10-03 DIAGNOSIS — R627 Adult failure to thrive: Secondary | ICD-10-CM | POA: Diagnosis not present

## 2017-10-03 DIAGNOSIS — F17210 Nicotine dependence, cigarettes, uncomplicated: Secondary | ICD-10-CM | POA: Diagnosis present

## 2017-10-03 DIAGNOSIS — W19XXXA Unspecified fall, initial encounter: Secondary | ICD-10-CM

## 2017-10-03 DIAGNOSIS — M6281 Muscle weakness (generalized): Secondary | ICD-10-CM

## 2017-10-03 DIAGNOSIS — Z87891 Personal history of nicotine dependence: Secondary | ICD-10-CM | POA: Insufficient documentation

## 2017-10-03 DIAGNOSIS — I5032 Chronic diastolic (congestive) heart failure: Secondary | ICD-10-CM

## 2017-10-03 DIAGNOSIS — M069 Rheumatoid arthritis, unspecified: Secondary | ICD-10-CM | POA: Diagnosis present

## 2017-10-03 DIAGNOSIS — R5381 Other malaise: Secondary | ICD-10-CM | POA: Diagnosis not present

## 2017-10-03 DIAGNOSIS — Y92012 Bathroom of single-family (private) house as the place of occurrence of the external cause: Secondary | ICD-10-CM | POA: Insufficient documentation

## 2017-10-03 DIAGNOSIS — Z794 Long term (current) use of insulin: Secondary | ICD-10-CM | POA: Insufficient documentation

## 2017-10-03 DIAGNOSIS — W010XXA Fall on same level from slipping, tripping and stumbling without subsequent striking against object, initial encounter: Secondary | ICD-10-CM

## 2017-10-03 DIAGNOSIS — N189 Chronic kidney disease, unspecified: Secondary | ICD-10-CM

## 2017-10-03 DIAGNOSIS — E876 Hypokalemia: Secondary | ICD-10-CM | POA: Diagnosis present

## 2017-10-03 DIAGNOSIS — J449 Chronic obstructive pulmonary disease, unspecified: Secondary | ICD-10-CM | POA: Insufficient documentation

## 2017-10-03 DIAGNOSIS — E1165 Type 2 diabetes mellitus with hyperglycemia: Secondary | ICD-10-CM | POA: Diagnosis present

## 2017-10-03 DIAGNOSIS — I5033 Acute on chronic diastolic (congestive) heart failure: Secondary | ICD-10-CM | POA: Diagnosis present

## 2017-10-03 DIAGNOSIS — K5900 Constipation, unspecified: Secondary | ICD-10-CM | POA: Diagnosis not present

## 2017-10-03 DIAGNOSIS — I130 Hypertensive heart and chronic kidney disease with heart failure and stage 1 through stage 4 chronic kidney disease, or unspecified chronic kidney disease: Secondary | ICD-10-CM

## 2017-10-03 DIAGNOSIS — I10 Essential (primary) hypertension: Secondary | ICD-10-CM | POA: Diagnosis present

## 2017-10-03 DIAGNOSIS — Z7189 Other specified counseling: Secondary | ICD-10-CM

## 2017-10-03 DIAGNOSIS — E785 Hyperlipidemia, unspecified: Secondary | ICD-10-CM | POA: Diagnosis present

## 2017-10-03 DIAGNOSIS — Z7901 Long term (current) use of anticoagulants: Secondary | ICD-10-CM

## 2017-10-03 DIAGNOSIS — R531 Weakness: Secondary | ICD-10-CM

## 2017-10-03 DIAGNOSIS — G4730 Sleep apnea, unspecified: Secondary | ICD-10-CM | POA: Diagnosis present

## 2017-10-03 DIAGNOSIS — E1122 Type 2 diabetes mellitus with diabetic chronic kidney disease: Secondary | ICD-10-CM

## 2017-10-03 DIAGNOSIS — R600 Localized edema: Secondary | ICD-10-CM | POA: Diagnosis present

## 2017-10-03 DIAGNOSIS — Y92009 Unspecified place in unspecified non-institutional (private) residence as the place of occurrence of the external cause: Secondary | ICD-10-CM

## 2017-10-03 HISTORY — DX: Repeated falls: R29.6

## 2017-10-03 LAB — CBG MONITORING, ED
Glucose-Capillary: 144 mg/dL — ABNORMAL HIGH (ref 70–99)
Glucose-Capillary: 196 mg/dL — ABNORMAL HIGH (ref 70–99)

## 2017-10-03 LAB — I-STAT CHEM 8, ED
BUN: 50 mg/dL — ABNORMAL HIGH (ref 8–23)
BUN: 58 mg/dL — ABNORMAL HIGH (ref 8–23)
Calcium, Ion: 1.2 mmol/L (ref 1.15–1.40)
Calcium, Ion: 1.3 mmol/L (ref 1.15–1.40)
Chloride: 102 mmol/L (ref 98–111)
Chloride: 99 mmol/L (ref 98–111)
Creatinine, Ser: 1.6 mg/dL — ABNORMAL HIGH (ref 0.61–1.24)
Creatinine, Ser: 1.5 mg/dL — ABNORMAL HIGH (ref 0.61–1.24)
Glucose, Bld: 139 mg/dL — ABNORMAL HIGH (ref 70–99)
Glucose, Bld: 157 mg/dL — ABNORMAL HIGH (ref 70–99)
HCT: 32 % — ABNORMAL LOW (ref 39.0–52.0)
HCT: 32 % — ABNORMAL LOW (ref 39.0–52.0)
Hemoglobin: 10.9 g/dL — ABNORMAL LOW (ref 13.0–17.0)
Hemoglobin: 10.9 g/dL — ABNORMAL LOW (ref 13.0–17.0)
Potassium: 3.9 mmol/L (ref 3.5–5.1)
Potassium: 3.1 mmol/L — ABNORMAL LOW (ref 3.5–5.1)
Sodium: 135 mmol/L (ref 135–145)
Sodium: 136 mmol/L (ref 135–145)
TCO2: 26 mmol/L (ref 22–32)
TCO2: 28 mmol/L (ref 22–32)

## 2017-10-03 LAB — CBC WITH DIFFERENTIAL/PLATELET
Abs Immature Granulocytes: 0.1 10*3/uL (ref 0.0–0.1)
Basophils Absolute: 0 10*3/uL (ref 0.0–0.1)
Basophils Relative: 0 %
Eosinophils Absolute: 0 10*3/uL (ref 0.0–0.7)
Eosinophils Relative: 0 %
HCT: 34 % — ABNORMAL LOW (ref 39.0–52.0)
Hemoglobin: 10.8 g/dL — ABNORMAL LOW (ref 13.0–17.0)
Immature Granulocytes: 1 %
Lymphocytes Relative: 13 %
Lymphs Abs: 1.5 10*3/uL (ref 0.7–4.0)
MCH: 34.7 pg — ABNORMAL HIGH (ref 26.0–34.0)
MCHC: 31.8 g/dL (ref 30.0–36.0)
MCV: 109.3 fL — ABNORMAL HIGH (ref 78.0–100.0)
Monocytes Absolute: 0.7 10*3/uL (ref 0.1–1.0)
Monocytes Relative: 6 %
Neutro Abs: 9.1 10*3/uL — ABNORMAL HIGH (ref 1.7–7.7)
Neutrophils Relative %: 80 %
Platelets: 119 10*3/uL — ABNORMAL LOW (ref 150–400)
RBC: 3.11 MIL/uL — ABNORMAL LOW (ref 4.22–5.81)
RDW: 17.3 % — ABNORMAL HIGH (ref 11.5–15.5)
WBC: 11.5 10*3/uL — ABNORMAL HIGH (ref 4.0–10.5)

## 2017-10-03 LAB — URINALYSIS, ROUTINE W REFLEX MICROSCOPIC
Bilirubin Urine: NEGATIVE
Glucose, UA: NEGATIVE mg/dL
Hgb urine dipstick: NEGATIVE
Ketones, ur: 5 mg/dL — AB
Leukocytes, UA: NEGATIVE
Nitrite: NEGATIVE
Protein, ur: NEGATIVE mg/dL
Specific Gravity, Urine: 1.017 (ref 1.005–1.030)
pH: 6 (ref 5.0–8.0)

## 2017-10-03 LAB — I-STAT TROPONIN, ED: Troponin i, poc: 0.05 ng/mL (ref 0.00–0.08)

## 2017-10-03 LAB — CK: Total CK: 80 U/L (ref 49–397)

## 2017-10-03 LAB — MAGNESIUM: Magnesium: 1.7 mg/dL (ref 1.7–2.4)

## 2017-10-03 MED ORDER — DOXYCYCLINE HYCLATE 100 MG PO TABS
100.0000 mg | ORAL_TABLET | Freq: Two times a day (BID) | ORAL | Status: DC
  Administered 2017-10-03 – 2017-10-04 (×2): 100 mg via ORAL
  Filled 2017-10-03 (×2): qty 1

## 2017-10-03 MED ORDER — INSULIN ASPART 100 UNIT/ML ~~LOC~~ SOLN
0.0000 [IU] | Freq: Three times a day (TID) | SUBCUTANEOUS | Status: DC
  Administered 2017-10-03: 4 [IU] via SUBCUTANEOUS
  Administered 2017-10-04: 3 [IU] via SUBCUTANEOUS
  Filled 2017-10-03 (×2): qty 1

## 2017-10-03 MED ORDER — POTASSIUM CHLORIDE CRYS ER 20 MEQ PO TBCR
40.0000 meq | EXTENDED_RELEASE_TABLET | Freq: Once | ORAL | Status: AC
  Administered 2017-10-03: 40 meq via ORAL
  Filled 2017-10-03: qty 2

## 2017-10-03 MED ORDER — MOMETASONE FURO-FORMOTEROL FUM 200-5 MCG/ACT IN AERO
2.0000 | INHALATION_SPRAY | Freq: Two times a day (BID) | RESPIRATORY_TRACT | Status: DC
  Administered 2017-10-04: 2 via RESPIRATORY_TRACT
  Filled 2017-10-03: qty 8.8

## 2017-10-03 MED ORDER — POTASSIUM CHLORIDE CRYS ER 20 MEQ PO TBCR
10.0000 meq | EXTENDED_RELEASE_TABLET | Freq: Every day | ORAL | Status: DC
  Administered 2017-10-04: 10 meq via ORAL
  Filled 2017-10-03: qty 1

## 2017-10-03 MED ORDER — ALBUTEROL SULFATE HFA 108 (90 BASE) MCG/ACT IN AERS
2.0000 | INHALATION_SPRAY | RESPIRATORY_TRACT | Status: DC | PRN
  Administered 2017-10-03 (×2): 2 via RESPIRATORY_TRACT
  Filled 2017-10-03: qty 6.7

## 2017-10-03 MED ORDER — POLYETHYLENE GLYCOL 3350 17 G PO PACK
17.0000 g | PACK | Freq: Every day | ORAL | Status: DC
  Administered 2017-10-03: 17 g via ORAL
  Filled 2017-10-03: qty 1

## 2017-10-03 MED ORDER — POLYETHYLENE GLYCOL 3350 17 G PO PACK: 17.0000 g | PACK | Freq: Every day | ORAL | 0 refills | Status: DC

## 2017-10-03 MED ORDER — TIOTROPIUM BROMIDE MONOHYDRATE 2.5 MCG/ACT IN AERS: 2.0000 | INHALATION_SPRAY | Freq: Every day | RESPIRATORY_TRACT | Status: DC

## 2017-10-03 MED ORDER — BISACODYL 10 MG RE SUPP: 10.0000 mg | Freq: Every day | RECTAL | 0 refills | Status: DC | PRN

## 2017-10-03 MED ORDER — INSULIN ASPART 100 UNIT/ML ~~LOC~~ SOLN: 0.0000 [IU] | Freq: Every day | SUBCUTANEOUS | Status: DC

## 2017-10-03 MED ORDER — TIOTROPIUM BROMIDE MONOHYDRATE 18 MCG IN CAPS
18.0000 ug | ORAL_CAPSULE | Freq: Every day | RESPIRATORY_TRACT | Status: DC
  Administered 2017-10-03 – 2017-10-04 (×2): 18 ug via RESPIRATORY_TRACT
  Filled 2017-10-03: qty 5

## 2017-10-03 MED ORDER — APIXABAN 5 MG PO TABS
5.0000 mg | ORAL_TABLET | Freq: Two times a day (BID) | ORAL | Status: DC
  Administered 2017-10-03 – 2017-10-04 (×2): 5 mg via ORAL
  Filled 2017-10-03 (×2): qty 1

## 2017-10-03 MED ORDER — BISACODYL 10 MG RE SUPP
10.0000 mg | Freq: Every day | RECTAL | Status: DC | PRN
  Administered 2017-10-03: 10 mg via RECTAL
  Filled 2017-10-03: qty 1

## 2017-10-03 NOTE — ED Notes (Signed)
CBG is 144.

## 2017-10-03 NOTE — ED Notes (Signed)
Assisted w/pt w/putting on new gown d/t spilled tea on himself. Warm blankets given.

## 2017-10-03 NOTE — ED Notes (Signed)
Pt drove himself here because he has not had a bm for a day and one half  He reports that he just cannot get it out  He has not tried laxatives or enemas on his own

## 2017-10-03 NOTE — ED Triage Notes (Signed)
PT only had  One shoe when he arrived to ED .

## 2017-10-03 NOTE — ED Provider Notes (Signed)
[facility] EMERGENCY DEPARTMENT Provider Note   CSN: [id number] Arrival date & time: 10/03/17  1236     History   Chief Complaint Chief Complaint  Patient presents with  . Fall    HPI Kiefer Opheim is a 72 y.o. male.  The history is provided by the patient and medical records. No language interpreter was used.     72 year old morbidly obese male with history of COPD, CHF, arthralgia, prior PE on Eliquis, AAA, recurrent falls, hearing loss, hypertension brought here via EMS from home for evaluation of a fall.  Patient states he lives at home by himself, he does have physical therapy that comes out to help him.  He mentions since June he has had progressive worsening generalized weakness.  Today he felt constipated, went to use the bathroom, and while leaning over to pick up an object, he tumbled over and fell to the ground.  He denies hitting his head or loss consciousness but states that he did not have any strength to get up.  He was on the ground for approximately 6 to 7 hours.  He did use his car key to sound the emergency alarm and he was able to garner the attention of his neighbor who called EMS.  Patient denies having any significant injury from the fall but states that he is just weak.  He denies any precipitating symptoms prior to the fall.  No recent sickness.  No complaint of headache, lightheadedness, dizziness, chest pain, shortness of breath, productive cough, abdominal pain, back pain, new focal numbness or focal weakness  Past Medical History:  Diagnosis Date  . AAA (abdominal aortic aneurysm) ([place])   . Allergy    Rhinitis  . Arthritis    Rhuematoid  . Asthma   . Chronic renal insufficiency   . Diverticulosis   . Emphysema   . Falls frequently   . GERD (gastroesophageal reflux disease)   . Hearing loss of both ears   . Hiatal hernia   . Hyperlipidemia   . Hypertension   . Male hypogonadism   . Nephrolithiasis   . Nephrolithiasis   .  Sleep apnea    On CPAP  . Stasis dermatitis   . Varicose veins   . Vitamin B 12 deficiency     Patient Active Problem List   Diagnosis Date Noted  . Positive D dimer 09/26/2017  . Elevated troponin 09/26/2017  . Personal history of DVT (deep vein thrombosis) 09/26/2017  . COPD with acute exacerbation ([place]) 09/26/2017  . CAD (coronary artery disease) 09/08/2017  . Goals of care, counseling/discussion   . Palliative care by specialist   . Acute kidney injury superimposed on chronic kidney disease ([place]) 09/02/2017  . Hypokalemia 09/02/2017  . Hypomagnesemia 09/02/2017  . Controlled type 2 diabetes mellitus with hyperglycemia ([place]) 09/02/2017  . Urinary incontinence 09/02/2017  . Fall 08/13/2017  . Acute renal failure superimposed on stage 3 chronic kidney disease ([place]) 08/13/2017  . HLD (hyperlipidemia) 08/13/2017  . Essential hypertension 08/13/2017  . GERD (gastroesophageal reflux disease) 08/13/2017  . Orthostatic hypotension 08/13/2017  . Deep vein thrombosis (DVT) of both lower extremities ([place])   . Hyperglycemia   . History of pulmonary embolism 07/20/2017  . Chronic diastolic CHF (congestive heart failure) ([place]) 07/20/2017  . Bilateral leg edema 07/20/2017  . Acute exacerbation of chronic obstructive pulmonary disease (COPD) ([place]) 07/19/2017  . 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 Cigarette smoker 09/08/2016  . Morbid obesity due to excess  calories ([place]) 09/08/2016  . Pain in joint, ankle and foot 03/31/2012  . Abnormality of gait 03/31/2012  . Rheumatoid arthritis ([place]) 03/31/2012  . COPD GOLD III  02/12/2011  . Sleep apnea 02/12/2011    Past Surgical History:  Procedure Laterality Date  . CARPAL TUNNEL RELEASE     bilateral  . FRACTURE SURGERY Left    Fibula  . JOINT REPLACEMENT Left    Shoulder  . JOINT REPLACEMENT Left    Hip   . KIDNEY STONE SURGERY  2003  . left hip replacement    . left knee    . left shoulder    . LITHOTRIPSY           Home Medications    Prior to Admission medications   Medication Sig Start Date End Date Taking? Authorizing Provider  albuterol (PROVENTIL HFA;VENTOLIN HFA) 108 (90 Base) MCG/ACT inhaler Inhale 2 puffs into the lungs every 4 (four) hours as needed for wheezing or shortness of breath. 07/30/17   Tanda Rockers, MD  allopurinol (ZYLOPRIM) 100 MG tablet Take 1 tablet (100 mg total) by mouth daily. 09/04/17 10/04/17  Florencia Reasons, MD  apixaban (ELIQUIS) 5 MG TABS tablet Take 1 tablet (5 mg total) by mouth 2 (two) times daily. 09/04/17   Florencia Reasons, MD  Ascorbic Acid (VITAMIN C) 1000 MG tablet Take 1,000 mg by mouth daily.    [provider]  atorvastatin (LIPITOR) 40 MG tablet Take 1 tablet (40 mg total) by mouth daily. 05/18/17 10/03/25  Lelon Perla, MD  bisacodyl (DULCOLAX) 10 MG suppository Place 1 suppository (10 mg total) rectally daily as needed for moderate constipation. 10/03/17   Petrucelli, Samantha R, PA-C  blood glucose meter kit and supplies Dispense based on patient and insurance preference. Use up to four times daily as directed. (FOR ICD-10 E10.9, E11.9). 09/29/17   Danford, Suann Larry, MD  Calcium-Magnesium-Vitamin D (CITRACAL CALCIUM+D) 600-40-500 MG-MG-UNIT TB24 Take 1 tablet by mouth daily.      [provider]  cetirizine (ZYRTEC) 10 MG tablet Take 10 mg by mouth daily.      [provider]  cyanocobalamin 2000 MCG tablet Take 2,500 mcg by mouth daily.     [provider]  doxycycline (VIBRA-TABS) 100 MG tablet Take 1 tablet (100 mg total) by mouth every 12 (twelve) hours. 09/29/17   Danford, Suann Larry, MD  folic acid (FOLVITE) 427 MCG tablet Take 1,200 mcg by mouth daily.    [provider]  guaiFENesin (MUCINEX) 600 MG 12 hr tablet Take 1,200 mg by mouth 2 (two) times daily.    [provider]  insulin glargine (LANTUS) 100 UNIT/ML injection Inject 0.24 mLs (24 Units total) into the skin daily. 09/29/17   Danford,  Suann Larry, MD  ipratropium-albuterol (DUONEB) 0.5-2.5 (3) MG/3ML SOLN Take 3 mLs by nebulization every 6 (six) hours.     [provider]  mometasone-formoterol (DULERA) 200-5 MCG/ACT AERO Inhale 2 puffs into the lungs 2 (two) times daily. 07/23/17   Doreatha Lew, MD  Multiple Vitamin (MULTIVITAMIN) tablet Take 1 tablet by mouth daily.      [provider]  OXYGEN 2 L. BIPAP with 1lpm o2     [provider]  pantoprazole (PROTONIX) 40 MG tablet Take 40 mg by mouth 2 (two) times daily.     [provider]  polyethylene glycol (MIRALAX) packet Take 17 g by mouth daily. 10/03/17   Petrucelli, Samantha R, PA-C  potassium chloride (  MICRO-K) 10 MEQ CR capsule Take 1 capsule (10 mEq total) by mouth daily. 09/04/17   Florencia Reasons, MD  Respiratory Therapy Supplies (FLUTTER) DEVI Use as directed 11/04/16   Tanda Rockers, MD  sodium chloride (OCEAN) 0.65 % SOLN nasal spray Place 1 spray into both nostrils daily as needed for congestion.     [provider]  Tiotropium Bromide Monohydrate (SPIRIVA RESPIMAT) 2.5 MCG/ACT AERS Inhale 2 puffs into the lungs daily. 05/31/17   Tanda Rockers, MD    Family History Family History  Problem Relation Age of Onset  . Emphysema Father   . Asthma Father   . Osteoarthritis Father   . Heart attack Mother   . Uterine cancer Mother   . Heart disease Mother   . ADD / ADHD Son   . Depression Son   . Hypertension Brother   . Heart disease Brother        Pacemaker   . Heart murmur Brother     Social History Social History   Tobacco Use  . Smoking status: Former Smoker    Packs/day: 0.10    Years: 53.00    Pack years: 5.30    Types: Cigarettes    Last attempt to quit: 08/11/2017    Years since quitting: 0.1  . Smokeless tobacco: Never Used  . Tobacco comment: 1-2 cigarettes a day   Substance Use Topics  . Alcohol use: Not Currently    Alcohol/week: 0.0 standard drinks  . Drug use: No     Allergies    Chantix [varenicline]   Review of Systems Review of Systems  All other systems reviewed and are negative.    Physical Exam Updated Vital Signs BP 113/68 (BP Location: Right Arm)   Pulse (!) 110   Temp (!) 97.3 F (36.3 C) (Oral)   Resp 20   Ht 5' 7"  (1.702 m)   Wt 99 kg   SpO2 100%   BMI 34.18 kg/m   Physical Exam  Constitutional: He appears well-developed and well-nourished. No distress.  Obese male laying in bed in no acute discomfort.  HENT:  Head: Normocephalic and atraumatic.  Eyes: Pupils are equal, round, and reactive to light. Conjunctivae and EOM are normal.  Neck: Normal range of motion. Neck supple.  No nuchal rigidity  Cardiovascular: Normal rate and regular rhythm.  Pulmonary/Chest: Effort normal and breath sounds normal.  Abdominal: Soft. He exhibits no distension. There is no tenderness.  Neurological: He is alert. GCS eye subscore is 4. GCS verbal subscore is 5. GCS motor subscore is 6.  Alert and oriented x2.  No cranial deficit.  Normal sensory to all 4 extremities.  Globally weak, only able to left lower extremities of the bed for a few seconds.  Gait not tested.  Skin: No rash noted.  Psychiatric: He has a normal mood and affect.  Nursing note and vitals reviewed.    ED Treatments / Results  Labs (all labs ordered are listed, but only abnormal results are displayed) Labs Reviewed  CBC WITH DIFFERENTIAL/PLATELET - Abnormal; Notable for the following components:      Result Value   WBC 11.5 (*)    RBC 3.11 (*)    Hemoglobin 10.8 (*)    HCT 34.0 (*)    MCV 109.3 (*)    MCH 34.7 (*)    RDW 17.3 (*)    Platelets 119 (*)    Neutro Abs 9.1 (*)    All other components within normal limits  I-STAT CHEM 8, ED - Abnormal; Notable for the following components:   Potassium 3.1 (*)    BUN 58 (*)    Creatinine, Ser 1.50 (*)    Glucose, Bld 157 (*)    Hemoglobin 10.9 (*)    HCT 32.0 (*)    All other components within normal limits  CBG  MONITORING, ED - Abnormal; Notable for the following components:   Glucose-Capillary 144 (*)    All other components within normal limits  CK  URINALYSIS, ROUTINE W REFLEX MICROSCOPIC  MAGNESIUM  I-STAT TROPONIN, ED    EKG EKG Interpretation  Date/Time:  Sunday October 03 2017 14:54:02 EDT Ventricular Rate:  104 PR Interval:    QRS Duration: 139 QT Interval:  376 QTC Calculation: 495 R Axis:   -55 Text Interpretation:  Sinus tachycardia Atrial premature complexes Right bundle branch block Inferior infarct, old Lateral leads are also involved no significant change since Aug 2019 Confirmed by Sherwood Gambler [phone]) on 10/03/2017 3:25:04 PM   Radiology No results found.  Procedures Procedures (including critical care time)  Medications Ordered in ED Medications  albuterol (PROVENTIL HFA;VENTOLIN HFA) 108 (90 Base) MCG/ACT inhaler 2 puff (2 puffs Inhalation Given 10/03/17 1610)  apixaban (ELIQUIS) tablet 5 mg (has no administration in time range)  bisacodyl (DULCOLAX) suppository 10 mg (has no administration in time range)  doxycycline (VIBRA-TABS) tablet 100 mg (100 mg Oral Given 10/03/17 1610)  mometasone-formoterol (DULERA) 200-5 MCG/ACT inhaler 2 puff (has no administration in time range)  potassium chloride SA (K-DUR,KLOR-CON) CR tablet 10 mEq (has no administration in time range)  Tiotropium Bromide Monohydrate AERS 2 puff (has no administration in time range)  insulin aspart (novoLOG) injection 0-20 Units (has no administration in time range)  insulin aspart (novoLOG) injection 0-5 Units (has no administration in time range)  potassium chloride SA (K-DUR,KLOR-CON) CR tablet 40 mEq (40 mEq Oral Given 10/03/17 1610)     Initial Impression / Assessment and Plan / ED Course  I have reviewed the triage vital signs and the nursing notes.  Pertinent labs & imaging results that were available during my care of the patient were reviewed by me and considered in my medical decision  making (see chart for details).     BP 113/68 (BP Location: Right Arm)   Pulse (!) 110   Temp (!) 97.3 F (36.3 C) (Oral)   Resp 20   Ht 5' 7"  (1.702 m)   Wt 99 kg   SpO2 100%   BMI 34.18 kg/m    Final Clinical Impressions(s) / ED Diagnoses   Final diagnoses:  Generalized weakness  Fall in home, initial encounter    ED Discharge Orders    None     3:54 PM Patient who is globally weak here after a fall that is mechanical but unable to get off the ground for several hours requiring help.  He has been seen in the ED several times and had been admitted several times for weakness.  Last admission and discharge 4 days ago with home health.  He is unable to care for himself at home and will need to be transferred to a skilled nursing facility.  Appreciate consultation from [facility] hospitalist, Dr. Evangeline Gula, who felt patient does not meet inpatient criteria however he does meet criteria to be admitted to a skilled nursing facility as he was admitted and stay for 3 days during the last hospitalization earlier in the week.  Appreciate consultation with Care Manager, Mariann Laster, who will help  facilitate transfer to skilled nursing facility.  However, patient may need to be board in the ED until tomorrow in order to get placement.    4:33 PM Pt sign out to Dr. Maryan Rued.     Domenic Moras, PA-C 10/03/17 1633    Sherwood Gambler, MD 10/05/17 1141

## 2017-10-03 NOTE — Care Management (Signed)
Patient was discharged home from [facility] inpatient unit with [facility] first services as per discharge summary. Patient was found at home on his floor by his neighbors, patient states he had been there for over 6 hours.   Goals of care is for SNF placement. Patient has had a 3 day qualifying stay. CSW also consulted.CM will leave handoff for ED CM and CSW to follow up tomorrow morning . Updated Fayrene Helper PA-C .

## 2017-10-03 NOTE — ED Notes (Signed)
Pt aware of delay w/dinner.

## 2017-10-03 NOTE — ED Triage Notes (Signed)
Pt fell at home and could not get up unassisted. EMS reported they could not leave Pt home alone  With out help. EMS report there are no new problems.

## 2017-10-03 NOTE — Consult Note (Signed)
Medical Consultation   Chad Duke  SFK:[id number]  DOB: 12/11/1945  DOA: 10/03/2017  PCP: [name], MD seen him in years has been seeing Dr. Sharee Pimple and occasionally the cardiologist Outpatient Specialists: Dr Sherene Sires pulmonary: Palliative care; Dr. Anne Fu from cardiology;   Requesting physician: Estanislado Emms, PA in ED  Reason for consultation: Assistance with placement  History of Present Illness: Chad Duke is an 72 y.o. male medical history significant for rheumatoid arthritis, chronic kidney disease stage III, COPD with chronic hypoxic respiratory failure, chronic diastolic congestive heart failure, history of DVT and PE on Eliquis, and coronary artery disease who now presents the emergency department after a fall at home.  He states that he fell off the toilet when he was reaching for something and lied on the floor for 6 to 7 hours.  Ultimately he pressed his car alarm and made some attention and a neighbor came and found him.  Today he had presented to the emergency department because he had not had a bowel movement in 24 hours.  Clearly the patient is seeking additional help and is unable to care for himself.  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  He was seen by palliative care where he discussed options for physician assisted suicide the palliative care practitioner obviously discussed with him that this was not a possibility in [place].  At that point he was still able to drive.  Since then he has had 4 admissions to the hospital and an additional 4 visits to the emergency department. Evaluation today reveals nothing significant that would qualify the patient for admission.  Generally he has had a recent 3-day stay in the past 30 days which would qualify him for skilled facility.  I do believe the patient would benefit from a skilled nursing facility. Last discharge from our facility 4 days ago where he was set up with home health nursing,  and aide, social work, PT, OT, every medical device necessary.  Spite this extensive assistance at home patient is continuing to come to the emergency department for assistance with problems that can be dealt with at home.  He simply needs more assistance.  And he is clearly weak and deconditioned  Review of Systems:  As per HPI otherwise 10 point review of systems negative.   Past Medical History: Past Medical History:  Diagnosis Date  . AAA (abdominal aortic aneurysm) ([facility])   . Allergy    Rhinitis  . Arthritis    Rhuematoid  . Asthma   . Chronic renal insufficiency   . Diverticulosis   . Emphysema   . Falls frequently   . GERD (gastroesophageal reflux disease)   . Hearing loss of both ears   . Hiatal hernia   . Hyperlipidemia   . Hypertension   . Male hypogonadism   . Nephrolithiasis   . Nephrolithiasis   . Sleep apnea    On CPAP  . Stasis dermatitis   . Varicose veins   . Vitamin B 12 deficiency     Past Surgical History: Past Surgical History:  Procedure Laterality Date  . CARPAL TUNNEL RELEASE     bilateral  . FRACTURE SURGERY Left    Fibula  . JOINT REPLACEMENT Left    Shoulder  . JOINT REPLACEMENT Left    Hip   . KIDNEY STONE SURGERY  2003  . left hip replacement    .  left knee    . left shoulder    . LITHOTRIPSY       Allergies:   Allergies  Allergen Reactions  . Chantix [Varenicline] Other (See Comments)    Suicidal thoughts     Social History:  reports that he quit smoking about 7 weeks ago. His smoking use included cigarettes. He has a 5.30 pack-year smoking history. He has never used smokeless tobacco. He reports that he drank alcohol. He reports that he does not use drugs.   Family History: Family History  Problem Relation Age of Onset  . Emphysema Father   . Asthma Father   . Osteoarthritis Father   . Heart attack Mother   . Uterine cancer Mother   . Heart disease Mother   . ADD / ADHD Son   . Depression Son   . Hypertension  Brother   . Heart disease Brother        Pacemaker   . Heart murmur Brother      Physical Exam: Vitals:   10/03/17 1239 10/03/17 1241 10/03/17 1243  BP:  113/68   Pulse:  (!) 110   Resp:  20   Temp:  (!) 97.3 F (36.3 C)   TempSrc:  Oral   SpO2: 100% 100%   Weight:   99 kg  Height:   5\' 7"  (1.702 m)    Constitutional: Plethoric, morbidly obese,  Alert and awake, oriented x3, not in any acute distress. Eyes: PERLA, EOMI, irises appear normal, anicteric sclera,  ENMT: external ears and nose appear normal, normal hearing             Lips appears normal, oropharynx mucosa, tongue, posterior pharynx appear normal  Neck: neck appears normal, no masses, normal ROM, no thyromegaly, no JVD  CVS: S1-S2 clear, no murmur rubs or gallops, 3+ LE edema, normal pedal pulses  Respiratory:  clear to auscultation bilaterally, no wheezing, rales or rhonchi. Respiratory effort normal. No accessory muscle use.  Abdomen: soft nontender, nondistended, normal bowel sounds, no hepatosplenomegaly, no hernias  Musculoskeletal: : no cyanosis, clubbing or edema noted bilaterally Neuro: Cranial nerves II-XII intact, strength, sensation, reflexes Psych: judgement and insight appear normal, stable mood and affect, mental status Skin: no rashes or lesions or ulcers, no induration or nodules, multiple bruises and abrasions in various stages of healing  Data reviewed:  I have personally reviewed following labs and imaging studies Labs:  CBC: Recent Labs  Lab 09/27/17 0248 09/28/17 0325 09/29/17 0335 10/03/17 0343 10/03/17 1438 10/03/17 1451  WBC 13.1* 16.9* 14.3*  --  11.5*  --   NEUTROABS  --   --   --   --  9.1*  --   HGB 10.4* 10.1* 9.9* 10.9* 10.8* 10.9*  HCT 31.7* 31.0* 30.5* 32.0* 34.0* 32.0*  MCV 105.3* 106.5* 107.8*  --  109.3*  --   PLT 157 154 138*  --  119*  --     Basic Metabolic Panel: Recent Labs  Lab 09/27/17 0248 09/28/17 0325 09/29/17 0335 10/03/17 0343 10/03/17 1451  NA  134* 135 134* 135 136  K 3.0* 4.5 5.2* 3.9 3.1*  CL 91* 96* 100 102 99  CO2 28 25 26   --   --   GLUCOSE 358* 255* 216* 139* 157*  BUN 79* 65* 59* 50* 58*  CREATININE 2.01* 1.68* 1.52* 1.60* 1.50*  CALCIUM 9.2 9.5 9.1  --   --    GFR Estimated Creatinine Clearance: 49.9 mL/min (A) (by C-G formula based  on SCr of 1.5 mg/dL (H)).  Cardiac Enzymes: Recent Labs  Lab 10/03/17 1438  CKTOTAL 80   CBG: Recent Labs  Lab 09/28/17 1624 09/28/17 2122 09/29/17 0753 09/29/17 1152 10/03/17 1520  GLUCAP 340* 325* 215* 226* 144*   Urinalysis    Component Value Date/Time   COLORURINE YELLOW 09/02/2017 1138   APPEARANCEUR HAZY (A) 09/02/2017 1138   LABSPEC <1.005 (L) 09/02/2017 1138   PHURINE 6.0 09/02/2017 1138   GLUCOSEU NEGATIVE 09/02/2017 1138   HGBUR SMALL (A) 09/02/2017 1138   BILIRUBINUR NEGATIVE 09/02/2017 1138   KETONESUR NEGATIVE 09/02/2017 1138   PROTEINUR NEGATIVE 09/02/2017 1138   UROBILINOGEN 0.2 03/28/2007 1330   NITRITE NEGATIVE 09/02/2017 1138   LEUKOCYTESUR NEGATIVE 09/02/2017 1138   EKG interpretation: personally read by me shows Sinus tachycardia, Atrial premature complexes, Right bundle branch block, Inferior infarct, old, Lateral leads are also involved, no significant change since Aug 2019   Inpatient Medications:   Scheduled Meds: . apixaban  5 mg Oral BID  . doxycycline  100 mg Oral Q12H  . insulin aspart  0-20 Units Subcutaneous TID WC  . insulin aspart  0-5 Units Subcutaneous QHS  . mometasone-formoterol  2 puff Inhalation BID  . potassium chloride  10 mEq Oral Daily  . Tiotropium Bromide Monohydrate  2 puff Inhalation Daily     Impression/Recommendations Principal Problem:   Adult failure to thrive Active Problems:   Hypokalemia   Goals of care, counseling/discussion   Weakness   Rheumatoid arthritis ([facility])   Cigarette smoker   Chronic diastolic CHF (congestive heart failure) ([facility])   Bilateral leg edema   Controlled type 2 diabetes mellitus  with hyperglycemia ([facility])   Urinary incontinence   COPD GOLD III    Sleep apnea   Morbid obesity due to excess calories ([facility])   HLD (hyperlipidemia)   Essential hypertension  Patient presents after a fall at home with diagnoses as listed above.  1.  Adult failure to thrive with associated weakness: Patient unable to function at home on his own anymore he has had 8 visits to the ER resulting in 4 admissions to the hospital in 52 days.  Rarely he can no longer function on his own.  He might benefit from skilled nursing.  Rehab may assist him in gaining better control over his situation at home.  Sadly he seems to be very pressed as well we will continue his home medications.  2.  Goals of care: Patient has had an extensive discussion with palliative care in July of this year.  I discussed with him the possibility of a skilled nursing facility to try to get him stronger.  He suggested he go home with home health nursing, PT, OT, social work, and an Engineer, production.  I suggested that he had recently had all of those prescribed for him the past for admissions and none of them have worked.  At this point he can go home if he wishes however he cannot keep him in the hospital as he does not qualify for admission.  I highly recommend the patient go to skilled nursing for physical therapy and rehab..  3.  Weakness: Patient states he is no longer able to ambulate with his walker he fell off the toilet today.  He was unable to get up off the floor.  Laid on the floor for 6 hours.  4.  The [employer] Gold 3 and a chronic smoker.  Patient continues to enjoy smoking despite COPD.  Pulmonary has been  working diligently with him to try to help him quit smoking.  5.  Morbid obesity due to excess calories: This contributes to the patient's poor functional status and weakness as well as his inability to walk.  6.  Chronic diastolic congestive heart failure patient with hyperdynamic heart with an EF of 65 to 70%.  7.  Urinary  incontinence: Likely related to his body habitus.  8.  Type 2 diabetes mellitus controlled with hyperglycemia: Noted to continue home care.  9.  Multiple other medical problems as listed above   Thank you for this consultation.  Recommend patient be admitted to skilled nursing facility for rehab with plans to either return home or move to [place] where his son lives.  Clearly benefit from additional assistance in the community from family.  We have tried to provide all the services available to him and yet it is not enough.   Time Spent: 55 minutes  Lahoma Crocker M.D. [facility] 10/03/2017, 4:19 PM

## 2017-10-03 NOTE — ED Notes (Signed)
Patient stated that he is on 2L of O2 at home and "forgot to bring his tank". He was at 98% on room air but requested to have oxygen by nasal cannula like at home. After putting him on 2Lof O2 he increased to 99%.

## 2017-10-03 NOTE — ED Notes (Signed)
CBG 154. 

## 2017-10-03 NOTE — ED Notes (Signed)
Incontinence care provided.  Condom cath placed at this time.  Moved to hospital bed for comfort. Tolerated well.  Encouraged to call for assistance as needed.

## 2017-10-03 NOTE — ED Provider Notes (Signed)
[facility] EMERGENCY DEPARTMENT Provider Note   CSN: [id number] Arrival date & time: 10/02/17  2338     History   Chief Complaint Chief Complaint  Patient presents with  . Constipation    HPI Chad Duke is a 72 y.o. male with a hx of AAA, chronic renal insufficiency, COPD on home O2, GERD, hyperlipidemia, hypertension, sleep apnea on CPAP, RA, DM, pulmonary embolism on Eliquis, and CAD who presents to the ED with complaints of constipation x 2 days. Patient states his last BM was Friday morning (08/23), this bowel movement was a bit hard and he had to strain. Since then he has not had a bowel movement. He states he has felt the urge and feels as though there is stool in his rectum, but he cannot seem to pass the stool. He states his rectum is sore from straining, discomfort is a 6/10 in severity. Prior to Friday patient was having fairly normal bowel movements. Typically has 2-3 stools daily. He was not having melena/hematochezia/diarrhea. He has not tried any OTC laxatives/stool softeners or enemas, he did attempt manual disimpaction without relief. He additionally denies fever, chills, nausea, vomiting, or abdominal pain. No prior abdominal surgeries.   He denies narcotic use. He is about to complete prednisone taper and abx for recent COPD exacerbation. No other new/recent change in medications.   HPI  Past Medical History:  Diagnosis Date  . AAA (abdominal aortic aneurysm) ([place])   . Allergy    Rhinitis  . Arthritis    Rhuematoid  . Asthma   . Chronic renal insufficiency   . Diverticulosis   . Emphysema   . GERD (gastroesophageal reflux disease)   . Hearing loss of both ears   . Hiatal hernia   . Hyperlipidemia   . Hypertension   . Male hypogonadism   . Nephrolithiasis   . Nephrolithiasis   . Sleep apnea    On CPAP  . Stasis dermatitis   . Varicose veins   . Vitamin B 12 deficiency     Patient Active Problem List   Diagnosis Date Noted  .  Positive D dimer 09/26/2017  . Elevated troponin 09/26/2017  . Personal history of DVT (deep vein thrombosis) 09/26/2017  . COPD with acute exacerbation ([place]) 09/26/2017  . CAD (coronary artery disease) 09/08/2017  . Goals of care, counseling/discussion   . Palliative care by specialist   . Acute kidney injury superimposed on chronic kidney disease ([place]) 09/02/2017  . Hypokalemia 09/02/2017  . Hypomagnesemia 09/02/2017  . Controlled type 2 diabetes mellitus with hyperglycemia ([place]) 09/02/2017  . Urinary incontinence 09/02/2017  . Fall 08/13/2017  . Acute renal failure superimposed on stage 3 chronic kidney disease ([place]) 08/13/2017  . HLD (hyperlipidemia) 08/13/2017  . Essential hypertension 08/13/2017  . GERD (gastroesophageal reflux disease) 08/13/2017  . Orthostatic hypotension 08/13/2017  . Deep vein thrombosis (DVT) of both lower extremities ([place])   . Hyperglycemia   . History of pulmonary embolism 07/20/2017  . Chronic diastolic CHF (congestive heart failure) ([place]) 07/20/2017  . Bilateral leg edema 07/20/2017  . Acute exacerbation of chronic obstructive pulmonary disease (COPD) ([place]) 07/19/2017  . 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 Cigarette smoker 09/08/2016  . Morbid obesity due to excess calories ([place]) 09/08/2016  . Pain in joint, ankle and foot 03/31/2012  . Abnormality of gait 03/31/2012  . Rheumatoid arthritis ([place]) 03/31/2012  . COPD GOLD III  02/12/2011  . Sleep apnea 02/12/2011    Past Surgical History:  Procedure Laterality Date  . CARPAL TUNNEL RELEASE     bilateral  . FRACTURE SURGERY Left    Fibula  . JOINT REPLACEMENT Left    Shoulder  . JOINT REPLACEMENT Left    Hip   . KIDNEY STONE SURGERY  2003  . left hip replacement    . left knee    . left shoulder    . LITHOTRIPSY          Home Medications    Prior to Admission medications   Medication Sig Start Date End Date Taking? Authorizing Provider  albuterol (PROVENTIL HFA;VENTOLIN HFA)  108 (90 Base) MCG/ACT inhaler Inhale 2 puffs into the lungs every 4 (four) hours as needed for wheezing or shortness of breath. 07/30/17  Yes Tanda Rockers, MD  allopurinol (ZYLOPRIM) 100 MG tablet Take 1 tablet (100 mg total) by mouth daily. 09/04/17 10/04/17 Yes Florencia Reasons, MD  apixaban (ELIQUIS) 5 MG TABS tablet Take 1 tablet (5 mg total) by mouth 2 (two) times daily. 09/04/17  Yes Florencia Reasons, MD  Ascorbic Acid (VITAMIN C) 1000 MG tablet Take 1,000 mg by mouth daily.   Yes [provider]  atorvastatin (LIPITOR) 40 MG tablet Take 1 tablet (40 mg total) by mouth daily. 05/18/17 10/03/25 Yes Lelon Perla, MD  Calcium-Magnesium-Vitamin D (CITRACAL CALCIUM+D) 600-40-500 MG-MG-UNIT TB24 Take 1 tablet by mouth daily.     Yes [provider]  cetirizine (ZYRTEC) 10 MG tablet Take 10 mg by mouth daily.     Yes [provider]  cyanocobalamin 2000 MCG tablet Take 2,500 mcg by mouth daily.    Yes [provider]  doxycycline (VIBRA-TABS) 100 MG tablet Take 1 tablet (100 mg total) by mouth every 12 (twelve) hours. 09/29/17  Yes Danford, Suann Larry, MD  folic acid (FOLVITE) 812 MCG tablet Take 1,200 mcg by mouth daily.   Yes [provider]  guaiFENesin (MUCINEX) 600 MG 12 hr tablet Take 1,200 mg by mouth 2 (two) times daily.   Yes [provider]  ipratropium-albuterol (DUONEB) 0.5-2.5 (3) MG/3ML SOLN Take 3 mLs by nebulization every 6 (six) hours.    Yes [provider]  mometasone-formoterol (DULERA) 200-5 MCG/ACT AERO Inhale 2 puffs into the lungs 2 (two) times daily. 07/23/17  Yes Doreatha Lew, MD  Multiple Vitamin (MULTIVITAMIN) tablet Take 1 tablet by mouth daily.     Yes [provider]  pantoprazole (PROTONIX) 40 MG tablet Take 40 mg by mouth 2 (two) times daily.    Yes [provider]  potassium chloride (MICRO-K) 10 MEQ CR capsule Take 1 capsule (10 mEq total) by mouth daily. 09/04/17  Yes Florencia Reasons, MD  sodium  chloride (OCEAN) 0.65 % SOLN nasal spray Place 1 spray into both nostrils daily as needed for congestion.    Yes [provider]  Tiotropium Bromide Monohydrate (SPIRIVA RESPIMAT) 2.5 MCG/ACT AERS Inhale 2 puffs into the lungs daily. 05/31/17  Yes Tanda Rockers, MD  blood glucose meter kit and supplies Dispense based on patient and insurance preference. Use up to four times daily as directed. (FOR ICD-10 E10.9, E11.9). 09/29/17   Danford, Suann Larry, MD  insulin glargine (LANTUS) 100 UNIT/ML injection Inject 0.24 mLs (24 Units total) into the skin daily. 09/29/17   Danford, Suann Larry, MD  methotrexate (RHEUMATREX) 2.5 MG tablet Take 4 tablets (10 mg total) by mouth once a week. Please hold methotrexate for now, please follow up with Dr Amil Amen for further instruction regarding  methotrazate. Patient not taking: Reported on 09/26/2017 09/04/17   Florencia Reasons, MD  OXYGEN 2 L. BIPAP with 1lpm o2     [provider]  predniSONE (STERAPRED UNI-PAK 21 TAB) 10 MG (21) TBPK tablet Take 6 day taper as outlined in instructions. Patient not taking: Reported on 10/03/2017 09/29/17   Edwin Dada, MD  Respiratory Therapy Supplies (FLUTTER) DEVI Use as directed 11/04/16   Tanda Rockers, MD    Family History Family History  Problem Relation Age of Onset  . Emphysema Father   . Asthma Father   . Osteoarthritis Father   . Heart attack Mother   . Uterine cancer Mother   . Heart disease Mother   . ADD / ADHD Son   . Depression Son   . Hypertension Brother   . Heart disease Brother        Pacemaker   . Heart murmur Brother     Social History Social History   Tobacco Use  . Smoking status: Former Smoker    Packs/day: 0.10    Years: 53.00    Pack years: 5.30    Types: Cigarettes    Last attempt to quit: 08/11/2017    Years since quitting: 0.1  . Smokeless tobacco: Never Used  . Tobacco comment: 1-2 cigarettes a day   Substance Use Topics  . Alcohol use: Not Currently     Alcohol/week: 0.0 standard drinks  . Drug use: No     Allergies   Chantix [varenicline]   Review of Systems Review of Systems  All other systems reviewed and are negative.    Physical Exam Updated Vital Signs BP 93/73 (BP Location: Right Arm)   Pulse (!) 111   Temp (!) 97.4 F (36.3 C) (Oral)   Resp 18   Wt 99.3 kg   SpO2 99%   BMI 34.29 kg/m   Physical Exam  Constitutional: He appears well-developed and well-nourished.  Non-toxic appearance. No distress.  HENT:  Head: Normocephalic and atraumatic.  Eyes: Conjunctivae are normal. Right eye exhibits no discharge. Left eye exhibits no discharge.  Neck: Neck supple.  Cardiovascular: Normal rate and regular rhythm.  Pulmonary/Chest: Effort normal and breath sounds normal. No respiratory distress. He has no wheezes. He has no rhonchi. He has no rales.  Respiration even and unlabored on Home O2  Abdominal: Soft. Bowel sounds are normal. He exhibits no distension. There is no tenderness. There is no rebound and no guarding.  Genitourinary: Rectal exam shows external hemorrhoid (non thrombosed, nontender).  Genitourinary Comments: Patient has mild perianal erythema, skin appears irritated, no warmth or palpable fluctuance/induration.  DRE: there is palpable stool in the rectal vault- this is firm but not overly hard.  EDT present as chaperone.   Neurological: He is alert.  Clear speech.   Skin: Skin is warm and dry. No rash noted.  Psychiatric: He has a normal mood and affect. His behavior is normal.  Nursing note and vitals reviewed.    ED Treatments / Results  Labs (all labs ordered are listed, but only abnormal results are displayed) Labs Reviewed  I-STAT CHEM 8, ED - Abnormal; Notable for the following components:      Result Value   BUN 50 (*)    Creatinine, Ser 1.60 (*)    Glucose, Bld 139 (*)    Hemoglobin 10.9 (*)    HCT 32.0 (*)    All other components within normal limits   EKG None  Radiology No  results  found.  Procedures Procedures (including critical care time)  Medications Ordered in ED Medications  polyethylene glycol (MIRALAX / GLYCOLAX) packet 17 g (17 g Oral Given 10/03/17 0347)   Initial Impression / Assessment and Plan / ED Course  I have reviewed the triage vital signs and the nursing notes.  Pertinent labs & imaging results that were available during my care of the patient were reviewed by me and considered in my medical decision making (see chart for details).   Patient presents to the emergency department today for constipation.  Patient nontoxic-appearing, in no apparent distress, vitals notable for mild tachycardia-consistent with prior visits.  Abdomen is nontender, no peritoneal signs, patient has not had nausea or vomiting, no prior abdominal surgeries, doubt bowel obstruction/perforation.  I-STAT Chem-8 appears at baseline.  Given MiraLAX in the emergency department.  Patient states that he feels he is going to need to have a bowel movement soon, he is asking to be able to go home to avoid bowel movement in route.  Will give prescription for MiraLAX as well as Dulcolax suppository with close PCP follow-up. I discussed results, treatment plan, need for PCP follow-up, and return precautions with the patient. Provided opportunity for questions, patient confirmed understanding and is in agreement with plan.   Findings and plan of care discussed with supervising physician Dr. Kathrynn Humble who personally evaluated and examined this patient and is in agreement.    Vitals:   10/03/17 0500 10/03/17 0530  BP: 120/72 123/72  Pulse: (!) 102 (!) 101  Resp:    Temp:    SpO2: 99% 95%    Final Clinical Impressions(s) / ED Diagnoses   Final diagnoses:  Constipation, unspecified constipation type    ED Discharge Orders         Ordered    polyethylene glycol (MIRALAX) packet  Daily     10/03/17 0438    bisacodyl (DULCOLAX) 10 MG suppository  Daily PRN     10/03/17 0438             Tanashia Ciesla, Glynda Jaeger, PA-C 10/03/17 0602    Varney Biles, MD 10/03/17 [phone]

## 2017-10-03 NOTE — ED Notes (Signed)
Warm blanket given as requested

## 2017-10-03 NOTE — ED Notes (Addendum)
Pt arrived to F8 via stretcher. Pt noted to be alert, oriented. Pt noted to be wearing eyeglasses, hospital gown, and yellow socks. Clothing and his 1 shoe placed on counter. Pt states his other shoe was left at home. Aware will be in ED overnight d/t CM/SW assisting w/SNF placement d/t pt noted w/generalized weakness. Pt has urinal at bedside and labeled specimen cup. Pt given Diet Ginger Ale as requested. Pt aware will be moved on to bed when staff are available to assist d/t pt states he is took weak to assist RN w/moving onto bed. Dinner tray has been ordered. Pt lying on stretcher w/head of bed elevated watching tv. Call bell w/in reach.

## 2017-10-03 NOTE — Progress Notes (Signed)
CSW met with pt, who reports that he wanted to return home after his recent discharge from [facility], so skilled nursing was not pursued.  Pt concerned about being able to pay for this and we discussed medicare covering the services.  Pt reports [facility] is coming to his home, but they are only there a few days a week, not anywhere close to 24/7.  He fell this morning and was on the floor for 4-5 hours before he was able to get his car keys and hit the panic button, which caused his neighbor to come check on him. Pt reports he is now willing to go to a facility as he knows he needs it.  Pt lives off [address] and said there is a place nearby that would be his preference. Winferd Humphrey, MSW, LCSW Clinical Social Worker 10/03/2017 4:20 PM

## 2017-10-03 NOTE — ED Notes (Signed)
Med given 

## 2017-10-03 NOTE — Discharge Instructions (Addendum)
You were seen in the emergency department today for constipation.  Your lab work appeared consistent with prior lab work.  Your sugar is elevated at 216, be sure to monitor this closely.  We are giving a prescription for MiraLAX as well as a rectal suppository.  Please use each of these daily to help alleviate constipation.  You have not had a bowel movement in the next 24 hours please see your primary care provider and return to the ER.  Please follow-up with your primary care provider either way within the next 1 to 3 days.  Return to ER for new or worsening symptoms including but not limited to vomiting, abdominal pain, or any other concerns.

## 2017-10-03 NOTE — ED Triage Notes (Signed)
Patient c/o constipation that began yesterday; states that he had a BM yesterday but it was hard. States that he does not have an enema at home.

## 2017-10-04 DIAGNOSIS — Z86711 Personal history of pulmonary embolism: Secondary | ICD-10-CM | POA: Diagnosis not present

## 2017-10-04 DIAGNOSIS — R05 Cough: Secondary | ICD-10-CM | POA: Diagnosis not present

## 2017-10-04 DIAGNOSIS — K219 Gastro-esophageal reflux disease without esophagitis: Secondary | ICD-10-CM | POA: Diagnosis not present

## 2017-10-04 DIAGNOSIS — E46 Unspecified protein-calorie malnutrition: Secondary | ICD-10-CM | POA: Diagnosis not present

## 2017-10-04 DIAGNOSIS — R41841 Cognitive communication deficit: Secondary | ICD-10-CM | POA: Diagnosis not present

## 2017-10-04 DIAGNOSIS — F4321 Adjustment disorder with depressed mood: Secondary | ICD-10-CM | POA: Diagnosis not present

## 2017-10-04 DIAGNOSIS — J441 Chronic obstructive pulmonary disease with (acute) exacerbation: Secondary | ICD-10-CM | POA: Diagnosis not present

## 2017-10-04 DIAGNOSIS — E119 Type 2 diabetes mellitus without complications: Secondary | ICD-10-CM | POA: Diagnosis not present

## 2017-10-04 DIAGNOSIS — R278 Other lack of coordination: Secondary | ICD-10-CM | POA: Diagnosis not present

## 2017-10-04 DIAGNOSIS — I2510 Atherosclerotic heart disease of native coronary artery without angina pectoris: Secondary | ICD-10-CM | POA: Diagnosis not present

## 2017-10-04 DIAGNOSIS — R5381 Other malaise: Secondary | ICD-10-CM | POA: Diagnosis not present

## 2017-10-04 DIAGNOSIS — R2689 Other abnormalities of gait and mobility: Secondary | ICD-10-CM | POA: Diagnosis not present

## 2017-10-04 DIAGNOSIS — E1122 Type 2 diabetes mellitus with diabetic chronic kidney disease: Secondary | ICD-10-CM | POA: Diagnosis not present

## 2017-10-04 DIAGNOSIS — J449 Chronic obstructive pulmonary disease, unspecified: Secondary | ICD-10-CM | POA: Diagnosis not present

## 2017-10-04 DIAGNOSIS — I5032 Chronic diastolic (congestive) heart failure: Secondary | ICD-10-CM | POA: Diagnosis not present

## 2017-10-04 DIAGNOSIS — E785 Hyperlipidemia, unspecified: Secondary | ICD-10-CM | POA: Diagnosis not present

## 2017-10-04 DIAGNOSIS — R296 Repeated falls: Secondary | ICD-10-CM | POA: Diagnosis not present

## 2017-10-04 DIAGNOSIS — F330 Major depressive disorder, recurrent, mild: Secondary | ICD-10-CM | POA: Diagnosis not present

## 2017-10-04 DIAGNOSIS — I110 Hypertensive heart disease with heart failure: Secondary | ICD-10-CM | POA: Diagnosis not present

## 2017-10-04 DIAGNOSIS — N183 Chronic kidney disease, stage 3 (moderate): Secondary | ICD-10-CM | POA: Diagnosis not present

## 2017-10-04 DIAGNOSIS — R609 Edema, unspecified: Secondary | ICD-10-CM | POA: Diagnosis not present

## 2017-10-04 DIAGNOSIS — I509 Heart failure, unspecified: Secondary | ICD-10-CM | POA: Diagnosis not present

## 2017-10-04 DIAGNOSIS — G4730 Sleep apnea, unspecified: Secondary | ICD-10-CM | POA: Diagnosis not present

## 2017-10-04 DIAGNOSIS — I872 Venous insufficiency (chronic) (peripheral): Secondary | ICD-10-CM | POA: Diagnosis not present

## 2017-10-04 DIAGNOSIS — F419 Anxiety disorder, unspecified: Secondary | ICD-10-CM | POA: Diagnosis not present

## 2017-10-04 DIAGNOSIS — M069 Rheumatoid arthritis, unspecified: Secondary | ICD-10-CM | POA: Diagnosis not present

## 2017-10-04 DIAGNOSIS — N189 Chronic kidney disease, unspecified: Secondary | ICD-10-CM | POA: Diagnosis not present

## 2017-10-04 DIAGNOSIS — Z7401 Bed confinement status: Secondary | ICD-10-CM | POA: Diagnosis not present

## 2017-10-04 DIAGNOSIS — K5900 Constipation, unspecified: Secondary | ICD-10-CM | POA: Diagnosis not present

## 2017-10-04 DIAGNOSIS — M6281 Muscle weakness (generalized): Secondary | ICD-10-CM | POA: Diagnosis not present

## 2017-10-04 DIAGNOSIS — J9621 Acute and chronic respiratory failure with hypoxia: Secondary | ICD-10-CM | POA: Diagnosis not present

## 2017-10-04 DIAGNOSIS — M2550 Pain in unspecified joint: Secondary | ICD-10-CM | POA: Diagnosis not present

## 2017-10-04 DIAGNOSIS — G4733 Obstructive sleep apnea (adult) (pediatric): Secondary | ICD-10-CM | POA: Diagnosis not present

## 2017-10-04 LAB — CBG MONITORING, ED
Glucose-Capillary: 152 mg/dL — ABNORMAL HIGH (ref 70–99)
Glucose-Capillary: 135 mg/dL — ABNORMAL HIGH (ref 70–99)
Glucose-Capillary: 105 mg/dL — ABNORMAL HIGH (ref 70–99)

## 2017-10-04 MED ORDER — GI COCKTAIL ~~LOC~~
30.0000 mL | Freq: Once | ORAL | Status: AC
  Administered 2017-10-04: 30 mL via ORAL
  Filled 2017-10-04: qty 30

## 2017-10-04 NOTE — ED Notes (Signed)
Patient reports feeling SOB.  Wears 2L O2 at home.  Placed on 2L at this time.  O2 sat 94% on room air and up to 96% with 2L.  Breathing easy and nonlabored.  Encouraged to call for assistance as needed.

## 2017-10-04 NOTE — Progress Notes (Addendum)
7:51am- CSW has faxed pt out at this time. Awaiting responses.   CSW consulted for possible SNF placement. CSW spoke with pt at bedside where pt expressed being from home and wanting to return back home after discharge from the ED as pt would rather see if pt could finish services through [facility]. CSW advised pt that it appears that pt has been having a lot of trouble at home caring for self therefore going to a SNF may be a good idea for pt as opposed to going home immediatly. Pt still expressed wanting to go home. CSW made aware via note from LCSW on 10/03/17 that pt has agreed to SNF. CSW spoke again with pt at bedside to gather more information on hesitation to SNF. Pt then expressed to CSW "I have 20 days left at a facility and will go to SNF now". CSW attempted to make sure that pt was sure about going to SNF before CSW began process. Pt expressed being agreeable.  CSW to began SNF search at this time.   Claude Manges Darlis Wragg, MSW, LCSW-A Emergency Department Clinical Social Worker [phone]

## 2017-10-04 NOTE — Discharge Planning (Signed)
Pt currently active with [facility] for [facility] services.  Resumption of care requested. [facility] notified.  No DME needs identified at this time.

## 2017-10-04 NOTE — NC FL2 (Signed)
[place] LEVEL OF CARE SCREENING TOOL     IDENTIFICATION  Patient Name: Chad Duke Birthdate: 23-Feb-1945 Sex: male Admission Date (Current Location): 10/03/2017  [facility] and [place] Number:  Herbalist and Address:  [facility], [place] [address], [place], [place] [address]      Provider Number: [id number]  Attending Physician Name and Address:  No att. providers found  Relative Name and Phone Number:       Current Level of Care: Hospital Recommended Level of Care: [facility] Prior Approval Number:    Date Approved/Denied:   PASRR Number:   [id number]   Discharge Plan: SNF    Current Diagnoses: Patient Active Problem List   Diagnosis Date Noted  . Weakness 10/03/2017  . Adult failure to thrive 10/03/2017  . Positive D dimer 09/26/2017  . Elevated troponin 09/26/2017  . Personal history of DVT (deep vein thrombosis) 09/26/2017  . COPD with acute exacerbation ([place]) 09/26/2017  . CAD (coronary artery disease) 09/08/2017  . Goals of care, counseling/discussion   . Palliative care by specialist   . Acute kidney injury superimposed on chronic kidney disease ([place]) 09/02/2017  . Hypokalemia 09/02/2017  . Hypomagnesemia 09/02/2017  . Controlled type 2 diabetes mellitus with hyperglycemia ([place]) 09/02/2017  . Urinary incontinence 09/02/2017  . Fall 08/13/2017  . Acute renal failure superimposed on stage 3 chronic kidney disease ([place]) 08/13/2017  . HLD (hyperlipidemia) 08/13/2017  . Essential hypertension 08/13/2017  . GERD (gastroesophageal reflux disease) 08/13/2017  . Orthostatic hypotension 08/13/2017  . Deep vein thrombosis (DVT) of both lower extremities ([place])   . Hyperglycemia   . History of pulmonary embolism 07/20/2017  . Chronic diastolic CHF (congestive heart failure) ([place]) 07/20/2017  . Bilateral leg edema 07/20/2017  . Acute exacerbation of chronic obstructive pulmonary disease (COPD) ([place])  07/19/2017  . 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 Cigarette smoker 09/08/2016  . Morbid obesity due to excess calories ([place]) 09/08/2016  . Pain in joint, ankle and foot 03/31/2012  . Abnormality of gait 03/31/2012  . Rheumatoid arthritis ([place]) 03/31/2012  . COPD GOLD III  02/12/2011  . Sleep apnea 02/12/2011    Orientation RESPIRATION BLADDER Height & Weight     Self, Time, Situation, Place  O2 Incontinent Weight: 218 lb 4.1 oz (99 kg) Height:  _0  (170.2 cm)  BEHAVIORAL SYMPTOMS/MOOD NEUROLOGICAL BOWEL NUTRITION STATUS      Incontinent Diet(please see AVS or discharge summary. )  AMBULATORY STATUS COMMUNICATION OF NEEDS Skin   Extensive Assist Verbally Normal                       Personal Care Assistance Level of Assistance  Bathing, Feeding, Dressing Bathing Assistance: Maximum assistance Feeding assistance: Limited assistance Dressing Assistance: Maximum assistance     Functional Limitations Info  Sight, Hearing, Speech Sight Info: Adequate Hearing Info: Adequate Speech Info: Adequate    SPECIAL CARE FACTORS FREQUENCY  PT (By licensed PT), OT (By licensed OT)     PT Frequency: 5 times a week  OT Frequency: 5 times a week             Contractures Contractures Info: Not present    Additional Factors Info  Code Status, Allergies Code Status Info: Prior Allergies Info: Chantix Varenicline           Current Medications (10/04/2017):  This is the current hospital active medication list Current Facility-Administered Medications  Medication  Dose Route Frequency Provider Last Rate Last Dose  . albuterol (PROVENTIL HFA;VENTOLIN HFA) 108 (90 Base) MCG/ACT inhaler 2 puff  2 puff Inhalation Q4H PRN Domenic Moras, PA-C   2 puff at 10/03/17 2151  . apixaban (ELIQUIS) tablet 5 mg  5 mg Oral BID Domenic Moras, PA-C   5 mg at 10/03/17 1810  . bisacodyl (DULCOLAX) suppository 10 mg  10 mg Rectal Daily PRN Domenic Moras, PA-C   10 mg at 10/03/17 2151  . doxycycline  (VIBRA-TABS) tablet 100 mg  100 mg Oral Q12H Domenic Moras, PA-C   100 mg at 10/04/17 0320  . insulin aspart (novoLOG) injection 0-20 Units  0-20 Units Subcutaneous TID WC Domenic Moras, PA-C   3 Units at 10/04/17 0710  . insulin aspart (novoLOG) injection 0-5 Units  0-5 Units Subcutaneous QHS Domenic Moras, PA-C      . mometasone-formoterol [facility]) 200-5 MCG/ACT inhaler 2 puff  2 puff Inhalation BID Domenic Moras, PA-C      . potassium chloride SA (K-DUR,KLOR-CON) CR tablet 10 mEq  10 mEq Oral Daily Domenic Moras, PA-C      . tiotropium [facility]) inhalation capsule 18 mcg  18 mcg Inhalation Daily Kris Mouton, RPH   18 mcg at 10/03/17 1811   Current Outpatient Medications  Medication Sig Dispense Refill  . albuterol (PROVENTIL HFA;VENTOLIN HFA) 108 (90 Base) MCG/ACT inhaler Inhale 2 puffs into the lungs every 4 (four) hours as needed for wheezing or shortness of breath. 3 Inhaler 1  . apixaban (ELIQUIS) 5 MG TABS tablet Take 1 tablet (5 mg total) by mouth 2 (two) times daily. 1 tablet 0  . Ascorbic Acid (VITAMIN C) 1000 MG tablet Take 1,000 mg by mouth daily.    Marland Kitchen atorvastatin (LIPITOR) 40 MG tablet Take 1 tablet (40 mg total) by mouth daily. 90 tablet 3  . bisacodyl (DULCOLAX) 10 MG suppository Place 1 suppository (10 mg total) rectally daily as needed for moderate constipation. 5 suppository 0  . Calcium-Magnesium-Vitamin D (CITRACAL CALCIUM+D) 600-40-500 MG-MG-UNIT TB24 Take 1 tablet by mouth daily.      . cetirizine (ZYRTEC) 10 MG tablet Take 10 mg by mouth daily.      . cyanocobalamin 2000 MCG tablet Take 2,500 mcg by mouth daily.     Marland Kitchen doxycycline (VIBRA-TABS) 100 MG tablet Take 1 tablet (100 mg total) by mouth every 12 (twelve) hours. 5 tablet 0  . folic acid (FOLVITE) 914 MCG tablet Take 1,200 mcg by mouth daily.    Marland Kitchen guaiFENesin (MUCINEX) 600 MG 12 hr tablet Take 1,200 mg by mouth 2 (two) times daily.    Marland Kitchen ipratropium-albuterol (DUONEB) 0.5-2.5 (3) MG/3ML SOLN Take 3 mLs by nebulization every  6 (six) hours.     . mometasone-formoterol (DULERA) 200-5 MCG/ACT AERO Inhale 2 puffs into the lungs 2 (two) times daily. 1 Inhaler 0  . Multiple Vitamin (MULTIVITAMIN) tablet Take 1 tablet by mouth daily.      . OXYGEN 2 L. BIPAP with 1lpm o2     . pantoprazole (PROTONIX) 40 MG tablet Take 40 mg by mouth 2 (two) times daily.     . sodium chloride (OCEAN) 0.65 % SOLN nasal spray Place 1 spray into both nostrils at bedtime.     . Tiotropium Bromide Monohydrate (SPIRIVA RESPIMAT) 2.5 MCG/ACT AERS Inhale 2 puffs into the lungs daily. 1 Inhaler 0  . allopurinol (ZYLOPRIM) 100 MG tablet Take 1 tablet (100 mg total) by mouth daily. 30 tablet 0  . blood  glucose meter kit and supplies Dispense based on patient and insurance preference. Use up to four times daily as directed. (FOR ICD-10 E10.9, E11.9). 1 each 0  . insulin glargine (LANTUS) 100 UNIT/ML injection Inject 0.24 mLs (24 Units total) into the skin daily. 10 mL 11  . polyethylene glycol (MIRALAX) packet Take 17 g by mouth daily. 5 each 0  . potassium chloride (MICRO-K) 10 MEQ CR capsule Take 1 capsule (10 mEq total) by mouth daily. 30 capsule 0  . Respiratory Therapy Supplies (FLUTTER) DEVI Use as directed 1 each 0     Discharge Medications: Please see discharge summary for a list of discharge medications.  Relevant Imaging Results:  Relevant Lab Results:   Additional Information [phone].   Wetzel Bjornstad, LCSWA

## 2017-10-04 NOTE — Progress Notes (Signed)
Plan is for pt to discharge to [facility]; after 1:30pm today. CSW spoke with Toniann Fail from [facility] and was informed that they can take pt and that she would send CSW further needed information. CSW to fax over orders to facility once room number is received   Montel Clock S. Edmund Holcomb, MSW, LCSW-A Emergency Department Clinical Social Worker [phone]

## 2017-10-04 NOTE — ED Notes (Signed)
Dulcolax suppository given per patient request.  Assisted to use bedpan.  Small stool noted.  Encouraged to call for assistance as needed.

## 2017-10-04 NOTE — ED Notes (Signed)
Report given to [facility] nurse Clydie Braun at [phone]. RN states room has not been assigned and for Pt to be stopped at front desk for check in. Pt cannot arrive until after 1330 today

## 2017-10-04 NOTE — Progress Notes (Addendum)
CSW has spoken with pt and updated pt on transport at this time. CSW has scheduled pick up for 2-2:15pm. CSW has faxed over all documents to [facility] at this time. CSW offered to call pt's son and other family to inform them of transport and pt declined stating that pt would call them once pt is at the facility.   There are no further CSW needs at this time. CSW will sign off.   Claude Manges. Caster Fayette, MSW, LCSW-A Emergency Department Clinical Social Worker [phone]

## 2017-10-04 NOTE — ED Notes (Signed)
Condom cath dislodged.  Incontinence care provided and CC replaced.  Bed in lowest position with call bell within reach.  Encouraged to call for assistance as needed.

## 2017-10-04 NOTE — Telephone Encounter (Signed)
Attempted to contact patient to give message from Dr.Hilty. Mailbox was full and unable to leave a message.

## 2017-10-04 NOTE — ED Notes (Signed)
Assisted with using bedpan again.  Had another small soft stool.

## 2017-10-04 NOTE — Discharge Planning (Signed)
Clinical Social Work is seeking post-discharge placement for this patient at the following level of care: SNF.    

## 2017-10-05 DIAGNOSIS — R296 Repeated falls: Secondary | ICD-10-CM | POA: Diagnosis not present

## 2017-10-05 DIAGNOSIS — J449 Chronic obstructive pulmonary disease, unspecified: Secondary | ICD-10-CM | POA: Diagnosis not present

## 2017-10-05 DIAGNOSIS — I2510 Atherosclerotic heart disease of native coronary artery without angina pectoris: Secondary | ICD-10-CM | POA: Diagnosis not present

## 2017-10-05 DIAGNOSIS — M6281 Muscle weakness (generalized): Secondary | ICD-10-CM | POA: Diagnosis not present

## 2017-10-06 ENCOUNTER — Other Ambulatory Visit: Payer: Self-pay | Admitting: *Deleted

## 2017-10-06 NOTE — Patient Outreach (Signed)
[facility]) Care Management  10/06/2017  Chad Duke [birth year] [id number]  Onsite visit, met with [name], [place]. She reports that she will be meeting with patient this week to discuss discharge plans. She knows patient lives alone and that he has 2 sons who are estranged from him.  Noted patient has had several recent admissions and ED visits. He has a history of COPD, HF and diabetes. He was with the [facility] home first program in the past.   Plan to monitor for [facility] care mangement services upon discharge from facility.  Will attempt to meet with patient at next facility visit.  Will collaborate with [facility] UM  Royetta Crochet. Laymond Purser, RN, BSN, [place] [phone]) Business Cell  [phone]) Toll Free Office

## 2017-10-07 DIAGNOSIS — E46 Unspecified protein-calorie malnutrition: Secondary | ICD-10-CM | POA: Diagnosis not present

## 2017-10-07 DIAGNOSIS — J9621 Acute and chronic respiratory failure with hypoxia: Secondary | ICD-10-CM | POA: Diagnosis not present

## 2017-10-07 DIAGNOSIS — J449 Chronic obstructive pulmonary disease, unspecified: Secondary | ICD-10-CM | POA: Diagnosis not present

## 2017-10-07 DIAGNOSIS — I872 Venous insufficiency (chronic) (peripheral): Secondary | ICD-10-CM | POA: Diagnosis not present

## 2017-10-07 DIAGNOSIS — G4733 Obstructive sleep apnea (adult) (pediatric): Secondary | ICD-10-CM | POA: Diagnosis not present

## 2017-10-07 DIAGNOSIS — M069 Rheumatoid arthritis, unspecified: Secondary | ICD-10-CM | POA: Diagnosis not present

## 2017-10-07 DIAGNOSIS — E1122 Type 2 diabetes mellitus with diabetic chronic kidney disease: Secondary | ICD-10-CM | POA: Diagnosis not present

## 2017-10-07 DIAGNOSIS — R2689 Other abnormalities of gait and mobility: Secondary | ICD-10-CM | POA: Diagnosis not present

## 2017-10-07 DIAGNOSIS — N183 Chronic kidney disease, stage 3 (moderate): Secondary | ICD-10-CM | POA: Diagnosis not present

## 2017-10-07 DIAGNOSIS — I509 Heart failure, unspecified: Secondary | ICD-10-CM | POA: Diagnosis not present

## 2017-10-12 DIAGNOSIS — J449 Chronic obstructive pulmonary disease, unspecified: Secondary | ICD-10-CM | POA: Diagnosis not present

## 2017-10-12 DIAGNOSIS — I5032 Chronic diastolic (congestive) heart failure: Secondary | ICD-10-CM | POA: Diagnosis not present

## 2017-10-12 DIAGNOSIS — M6281 Muscle weakness (generalized): Secondary | ICD-10-CM | POA: Diagnosis not present

## 2017-10-12 DIAGNOSIS — Z86711 Personal history of pulmonary embolism: Secondary | ICD-10-CM | POA: Diagnosis not present

## 2017-10-14 ENCOUNTER — Other Ambulatory Visit: Payer: Self-pay | Admitting: *Deleted

## 2017-10-14 NOTE — Patient Outreach (Signed)
[facility] ([facility]) Care Management  10/14/2017  Chad Duke 05/15/1945 [id number]  Onsite visit to [facility]. Met with Peggy, SW, she states patient has a lot of issues, he has 2 children who live out town.   Met with patient. He reports that he knows he will need help upon going home. He does not feel he can afford an ALF but is wondering about any assistance that is available. He states he would like services 3 days a week for assistance with IADLs.   RNCM discussed [facility] care management. He confirms he had [facility] home first program in the past but "that is over".  RNCM reviewed that [facility] is care management and discussed differences in home care, custodial care and care management.  Patient would like to have information on community resources and to see if he qualifies for any programs for assistance.  Patient would also like to participate with [facility] care management.   Packet left with patient and verbal consent obtained.   Plan to make referral to LCSW to assess for home care providers program and give additional resources information.   Will update [facility] UM.  Mary E. Niemczura, RN, BSN, CCM  Post Acute Care Coordinator [facility] ([phone]) Business Cell  ([phone]) Toll Free Office  

## 2017-10-15 DIAGNOSIS — I5032 Chronic diastolic (congestive) heart failure: Secondary | ICD-10-CM | POA: Diagnosis not present

## 2017-10-15 DIAGNOSIS — J449 Chronic obstructive pulmonary disease, unspecified: Secondary | ICD-10-CM | POA: Diagnosis not present

## 2017-10-15 DIAGNOSIS — E119 Type 2 diabetes mellitus without complications: Secondary | ICD-10-CM | POA: Diagnosis not present

## 2017-10-15 DIAGNOSIS — R296 Repeated falls: Secondary | ICD-10-CM | POA: Diagnosis not present

## 2017-10-17 ENCOUNTER — Other Ambulatory Visit: Payer: Self-pay | Admitting: Internal Medicine

## 2017-10-19 DIAGNOSIS — K219 Gastro-esophageal reflux disease without esophagitis: Secondary | ICD-10-CM | POA: Diagnosis not present

## 2017-10-19 DIAGNOSIS — I2510 Atherosclerotic heart disease of native coronary artery without angina pectoris: Secondary | ICD-10-CM | POA: Diagnosis not present

## 2017-10-19 DIAGNOSIS — I5032 Chronic diastolic (congestive) heart failure: Secondary | ICD-10-CM | POA: Diagnosis not present

## 2017-10-19 DIAGNOSIS — N183 Chronic kidney disease, stage 3 (moderate): Secondary | ICD-10-CM | POA: Diagnosis not present

## 2017-10-20 ENCOUNTER — Other Ambulatory Visit: Payer: Self-pay | Admitting: *Deleted

## 2017-10-20 NOTE — Patient Outreach (Signed)
[facility]) Care Management  10/20/2017  Chad Duke 02-26-1945 [id number]   CSW had received referral from [facility] Post Acute RNCM, Corrie Dandy to assess for possible community resources upon discharge. Patient reports that he is aware that he will need assistance if/when he returns home but that he hopes to be able to stay at [facility] in the ALF once he completes the [facility] application which he was given by Karrie Doffing social worker. CSW offered to help with completing application as he would be able to get assistance through Personal Care Services or CAP, but patient declined stating that he plans to look over it later this afternoon. CSW informed patient of ALFs in the area that would accept medicaid patients, but patient states that he "probably wouldn't like them". CSW prepared patient for the possibility of having to go where there is a bed available as he is not safe to be home alone. CSW attempted to complete Financial Assistance Form but patient declined stating "I don't think you can help me". CSW will check back with patient within the next 2 weeks to offer assistance again.    Lincoln Maxin, LCSW [facility]  Clinical Social Worker cell #: [phone]

## 2017-10-27 ENCOUNTER — Other Ambulatory Visit: Payer: Self-pay | Admitting: *Deleted

## 2017-10-27 DIAGNOSIS — F330 Major depressive disorder, recurrent, mild: Secondary | ICD-10-CM | POA: Diagnosis not present

## 2017-10-27 DIAGNOSIS — L03113 Cellulitis of right upper limb: Secondary | ICD-10-CM | POA: Diagnosis not present

## 2017-10-27 DIAGNOSIS — E119 Type 2 diabetes mellitus without complications: Secondary | ICD-10-CM | POA: Diagnosis not present

## 2017-10-27 DIAGNOSIS — I5032 Chronic diastolic (congestive) heart failure: Secondary | ICD-10-CM | POA: Diagnosis not present

## 2017-10-27 DIAGNOSIS — I872 Venous insufficiency (chronic) (peripheral): Secondary | ICD-10-CM | POA: Diagnosis not present

## 2017-10-27 DIAGNOSIS — F4321 Adjustment disorder with depressed mood: Secondary | ICD-10-CM | POA: Diagnosis not present

## 2017-10-27 NOTE — Patient Outreach (Signed)
[facility]) Care Management  10/27/2017  Chad Duke 05-02-1945 [id number]   CSW met with patient at [facility] who confirms that he has decided to stay at [facility] for long term care, he is now there under private pay until his Medicaid is approved. CSW will sign off at this time as no further [facility]W needs identified.    [name], LCSW [facility]  Clinical Social Worker cell #: [phone]

## 2017-10-29 DIAGNOSIS — F0390 Unspecified dementia without behavioral disturbance: Secondary | ICD-10-CM | POA: Diagnosis not present

## 2017-10-29 DIAGNOSIS — M069 Rheumatoid arthritis, unspecified: Secondary | ICD-10-CM | POA: Diagnosis not present

## 2017-10-29 DIAGNOSIS — I509 Heart failure, unspecified: Secondary | ICD-10-CM | POA: Diagnosis not present

## 2017-10-29 DIAGNOSIS — G8929 Other chronic pain: Secondary | ICD-10-CM | POA: Diagnosis not present

## 2017-10-29 DIAGNOSIS — E1122 Type 2 diabetes mellitus with diabetic chronic kidney disease: Secondary | ICD-10-CM | POA: Diagnosis not present

## 2017-10-29 DIAGNOSIS — N183 Chronic kidney disease, stage 3 (moderate): Secondary | ICD-10-CM | POA: Diagnosis not present

## 2017-10-29 DIAGNOSIS — G4733 Obstructive sleep apnea (adult) (pediatric): Secondary | ICD-10-CM | POA: Diagnosis not present

## 2017-10-29 DIAGNOSIS — R600 Localized edema: Secondary | ICD-10-CM | POA: Diagnosis not present

## 2017-11-01 ENCOUNTER — Ambulatory Visit: Payer: Medicare Other | Admitting: *Deleted

## 2017-11-03 DIAGNOSIS — J449 Chronic obstructive pulmonary disease, unspecified: Secondary | ICD-10-CM | POA: Diagnosis not present

## 2017-11-03 DIAGNOSIS — I5032 Chronic diastolic (congestive) heart failure: Secondary | ICD-10-CM | POA: Diagnosis not present

## 2017-11-03 DIAGNOSIS — R600 Localized edema: Secondary | ICD-10-CM | POA: Diagnosis not present

## 2017-11-03 DIAGNOSIS — L03113 Cellulitis of right upper limb: Secondary | ICD-10-CM | POA: Diagnosis not present

## 2017-11-10 DIAGNOSIS — I872 Venous insufficiency (chronic) (peripheral): Secondary | ICD-10-CM | POA: Diagnosis not present

## 2017-11-10 DIAGNOSIS — I5032 Chronic diastolic (congestive) heart failure: Secondary | ICD-10-CM | POA: Diagnosis not present

## 2017-11-10 DIAGNOSIS — E119 Type 2 diabetes mellitus without complications: Secondary | ICD-10-CM | POA: Diagnosis not present

## 2017-11-10 DIAGNOSIS — F330 Major depressive disorder, recurrent, mild: Secondary | ICD-10-CM | POA: Diagnosis not present

## 2017-11-10 DIAGNOSIS — J449 Chronic obstructive pulmonary disease, unspecified: Secondary | ICD-10-CM | POA: Diagnosis not present

## 2017-11-10 DIAGNOSIS — F4321 Adjustment disorder with depressed mood: Secondary | ICD-10-CM | POA: Diagnosis not present

## 2017-11-17 DIAGNOSIS — F419 Anxiety disorder, unspecified: Secondary | ICD-10-CM | POA: Diagnosis not present

## 2017-11-24 DIAGNOSIS — F330 Major depressive disorder, recurrent, mild: Secondary | ICD-10-CM | POA: Diagnosis not present

## 2017-11-24 DIAGNOSIS — F4321 Adjustment disorder with depressed mood: Secondary | ICD-10-CM | POA: Diagnosis not present

## 2017-12-01 DIAGNOSIS — Z86711 Personal history of pulmonary embolism: Secondary | ICD-10-CM | POA: Diagnosis not present

## 2017-12-01 DIAGNOSIS — I5032 Chronic diastolic (congestive) heart failure: Secondary | ICD-10-CM | POA: Diagnosis not present

## 2017-12-01 DIAGNOSIS — F419 Anxiety disorder, unspecified: Secondary | ICD-10-CM | POA: Diagnosis not present

## 2017-12-01 DIAGNOSIS — E119 Type 2 diabetes mellitus without complications: Secondary | ICD-10-CM | POA: Diagnosis not present

## 2017-12-01 DIAGNOSIS — J449 Chronic obstructive pulmonary disease, unspecified: Secondary | ICD-10-CM | POA: Diagnosis not present

## 2017-12-08 DIAGNOSIS — R2689 Other abnormalities of gait and mobility: Secondary | ICD-10-CM | POA: Diagnosis not present

## 2017-12-08 DIAGNOSIS — M6281 Muscle weakness (generalized): Secondary | ICD-10-CM | POA: Diagnosis not present

## 2017-12-08 DIAGNOSIS — K219 Gastro-esophageal reflux disease without esophagitis: Secondary | ICD-10-CM | POA: Diagnosis not present

## 2017-12-08 DIAGNOSIS — I110 Hypertensive heart disease with heart failure: Secondary | ICD-10-CM | POA: Diagnosis not present

## 2017-12-08 DIAGNOSIS — F330 Major depressive disorder, recurrent, mild: Secondary | ICD-10-CM | POA: Diagnosis not present

## 2017-12-08 DIAGNOSIS — M069 Rheumatoid arthritis, unspecified: Secondary | ICD-10-CM | POA: Diagnosis not present

## 2017-12-08 DIAGNOSIS — E785 Hyperlipidemia, unspecified: Secondary | ICD-10-CM | POA: Diagnosis not present

## 2017-12-08 DIAGNOSIS — E1122 Type 2 diabetes mellitus with diabetic chronic kidney disease: Secondary | ICD-10-CM | POA: Diagnosis not present

## 2017-12-08 DIAGNOSIS — R278 Other lack of coordination: Secondary | ICD-10-CM | POA: Diagnosis not present

## 2017-12-08 DIAGNOSIS — R41841 Cognitive communication deficit: Secondary | ICD-10-CM | POA: Diagnosis not present

## 2017-12-08 DIAGNOSIS — G4730 Sleep apnea, unspecified: Secondary | ICD-10-CM | POA: Diagnosis not present

## 2017-12-08 DIAGNOSIS — I2510 Atherosclerotic heart disease of native coronary artery without angina pectoris: Secondary | ICD-10-CM | POA: Diagnosis not present

## 2017-12-08 DIAGNOSIS — F4321 Adjustment disorder with depressed mood: Secondary | ICD-10-CM | POA: Diagnosis not present

## 2017-12-08 DIAGNOSIS — N189 Chronic kidney disease, unspecified: Secondary | ICD-10-CM | POA: Diagnosis not present

## 2017-12-08 NOTE — Progress Notes (Signed)
HPI: FU CAD, pulmonary embolus and diastolic CHF. Abdominal ultrasound 6/18 showed AAA 2.8 x 3.3 cm. Chest CT 2/19 showed right middle lobe collapse.  There was note of three-vessel coronary calcification.    Nuclear study June 2019 showed ejection fraction 76% and no ischemia.  Echocardiogram June 2019 showed vigorous LV function, mild left ventricular hypertrophy, grade 1 diastolic dysfunction, very mild aortic stenosis and mild biatrial enlargement.  Venous Dopplers June 2019 showed chronic versus age-indeterminate deep vein thrombosis in the popliteal vein on the right and acute versus age-indeterminate DVT on the left.  CTA June 2019 showed left lower lobe pulmonary emboli.  Chest CT August 2019 showed 1.6 cm spiculated nodule in posterior left lower lobe and follow-up recommended 3 months.  Lasix stopped at time of previous hospitalization because of acute on chronic renal insufficiency.  Patient is a no CODE BLUE.  Since last seen he has some dyspnea on exertion but there is no orthopnea or PND.  He has worsening bilateral lower extremity pedal edema.  No chest pain or syncope.  He has had falls.  Current Outpatient Medications  Medication Sig Dispense Refill  . furosemide (LASIX) 40 MG tablet Take 40 mg by mouth daily.    Marland Kitchen albuterol (PROVENTIL HFA;VENTOLIN HFA) 108 (90 Base) MCG/ACT inhaler Inhale 2 puffs into the lungs every 4 (four) hours as needed for wheezing or shortness of breath. 3 Inhaler 1  . allopurinol (ZYLOPRIM) 100 MG tablet Take 1 tablet (100 mg total) by mouth daily. 30 tablet 0  . apixaban (ELIQUIS) 5 MG TABS tablet Take 1 tablet (5 mg total) by mouth 2 (two) times daily. 1 tablet 0  . Ascorbic Acid (VITAMIN C) 1000 MG tablet Take 1,000 mg by mouth daily.    Marland Kitchen atorvastatin (LIPITOR) 40 MG tablet Take 1 tablet (40 mg total) by mouth daily. 90 tablet 3  . bisacodyl (DULCOLAX) 10 MG suppository Place 1 suppository (10 mg total) rectally daily as needed for moderate  constipation. 5 suppository 0  . blood glucose meter kit and supplies Dispense based on patient and insurance preference. Use up to four times daily as directed. (FOR ICD-10 E10.9, E11.9). 1 each 0  . Calcium-Magnesium-Vitamin D (CITRACAL CALCIUM+D) 600-40-500 MG-MG-UNIT TB24 Take 1 tablet by mouth daily.      . cetirizine (ZYRTEC) 10 MG tablet Take 10 mg by mouth daily.      . cyanocobalamin 2000 MCG tablet Take 2,500 mcg by mouth daily.     Marland Kitchen doxycycline (VIBRA-TABS) 100 MG tablet Take 1 tablet (100 mg total) by mouth every 12 (twelve) hours. 5 tablet 0  . folic acid (FOLVITE) 629 MCG tablet Take 1,200 mcg by mouth daily.    Marland Kitchen guaiFENesin (MUCINEX) 600 MG 12 hr tablet Take 1,200 mg by mouth 2 (two) times daily.    . insulin glargine (LANTUS) 100 UNIT/ML injection Inject 0.24 mLs (24 Units total) into the skin daily. 10 mL 11  . ipratropium-albuterol (DUONEB) 0.5-2.5 (3) MG/3ML SOLN Take 3 mLs by nebulization every 6 (six) hours.     . mometasone-formoterol (DULERA) 200-5 MCG/ACT AERO Inhale 2 puffs into the lungs 2 (two) times daily. 1 Inhaler 0  . Multiple Vitamin (MULTIVITAMIN) tablet Take 1 tablet by mouth daily.      . OXYGEN 2 L. BIPAP with 1lpm o2     . pantoprazole (PROTONIX) 40 MG tablet Take 40 mg by mouth 2 (two) times daily.     . polyethylene glycol (  MIRALAX) packet Take 17 g by mouth daily. 5 each 0  . potassium chloride (MICRO-K) 10 MEQ CR capsule Take 1 capsule (10 mEq total) by mouth daily. 30 capsule 0  . predniSONE (DELTASONE) 20 MG tablet 10 mg as directed 30 tablet 0  . Respiratory Therapy Supplies (FLUTTER) DEVI Use as directed 1 each 0  . sodium chloride (OCEAN) 0.65 % SOLN nasal spray Place 1 spray into both nostrils at bedtime.     . Tiotropium Bromide Monohydrate (SPIRIVA RESPIMAT) 2.5 MCG/ACT AERS Inhale 2 puffs into the lungs daily. 1 Inhaler 0   No current facility-administered medications for this visit.      Past Medical History:  Diagnosis Date  . AAA  (abdominal aortic aneurysm) ([place])   . Allergy    Rhinitis  . Arthritis    Rhuematoid  . Asthma   . Chronic renal insufficiency   . Diverticulosis   . Emphysema   . Falls frequently   . GERD (gastroesophageal reflux disease)   . Hearing loss of both ears   . Hiatal hernia   . Hyperlipidemia   . Hypertension   . Male hypogonadism   . Nephrolithiasis   . Nephrolithiasis   . Sleep apnea    On CPAP  . Stasis dermatitis   . Varicose veins   . Vitamin B 12 deficiency     Past Surgical History:  Procedure Laterality Date  . CARPAL TUNNEL RELEASE     bilateral  . FRACTURE SURGERY Left    Fibula  . JOINT REPLACEMENT Left    Shoulder  . JOINT REPLACEMENT Left    Hip   . KIDNEY STONE SURGERY  2003  . left hip replacement    . left knee    . left shoulder    . LITHOTRIPSY      Social History   Socioeconomic History  . Marital status: Divorced    Spouse name: Not on file  . Number of children: 2  . Years of education: Not on file  . Highest education level: Not on file  Occupational History  . Not on file  Social Needs  . Financial resource strain: Not on file  . Food insecurity:    Worry: Not on file    Inability: Not on file  . Transportation needs:    Medical: Not on file    Non-medical: Not on file  Tobacco Use  . Smoking status: Former Smoker    Packs/day: 0.10    Years: 53.00    Pack years: 5.30    Types: Cigarettes    Last attempt to quit: 08/11/2017    Years since quitting: 0.3  . Smokeless tobacco: Never Used  . Tobacco comment: 1-2 cigarettes a day   Substance and Sexual Activity  . Alcohol use: Not Currently    Alcohol/week: 0.0 standard drinks  . Drug use: No  . Sexual activity: Not on file  Lifestyle  . Physical activity:    Days per week: Not on file    Minutes per session: Not on file  . Stress: Not on file  Relationships  . Social connections:    Talks on phone: Not on file    Gets together: Not on file    Attends religious service:  Not on file    Active member of club or organization: Not on file    Attends meetings of clubs or organizations: Not on file    Relationship status: Not on file  . Intimate partner  violence:    Fear of current or ex partner: Not on file    Emotionally abused: Not on file    Physically abused: Not on file    Forced sexual activity: Not on file  Other Topics Concern  . Not on file  Social History Narrative  . Not on file    Family History  Problem Relation Age of Onset  . Emphysema Father   . Asthma Father   . Osteoarthritis Father   . Heart attack Mother   . Uterine cancer Mother   . Heart disease Mother   . ADD / ADHD Son   . Depression Son   . Hypertension Brother   . Heart disease Brother        Pacemaker   . Heart murmur Brother     ROS: no fevers or chills, productive cough, hemoptysis, dysphasia, odynophagia, melena, hematochezia, dysuria, hematuria, rash, seizure activity, orthopnea, PND,  claudication. Remaining systems are negative.  Physical Exam: Well-developed chronically ill appearing in no acute distress.  Skin is warm and dry.  HEENT is normal.  Neck is supple.  Chest diminished BS throughout  Cardiovascular exam is regular rate and rhythm.  Abdominal exam nontender or distended. No masses palpated. Extremities show 2+ edema. neuro grossly intact  ECG-sinus tachycardia at a rate of 106, right bundle branch block, cannot rule out anterior infarct.  Personally reviewed  A/P  1 coronary artery disease-patient noted to have coronary calcification on previous CT.  Continue medical therapy including statin.  No aspirin given need for apixaban.  Previous nuclear study showed no ischemia.  2 abdominal aortic aneurysm-schedule follow-up ultrasound.  3 hypertension-patient's blood pressure is borderline.  Follow.  4 hyperlipidemia-continue statin.  5 tobacco abuse-patient has discontinued.  6 pulmonary embolus-continue apixaban.  7 pulmonary  nodule-schedule follow-up noncontrast chest CT.  8 right side CHF-patient is volume overloaded likely secondary to pulmonary hypertension related to COPD.  However he has had some renal insufficiency previously.  Increase Lasix to 60 mg daily.  Check potassium and renal function in 1 week.  We discussed low-sodium diet and fluid restriction to 1 L daily.  I will have him see APP 4 weeks and I will see him back in 3 months.  [name], MD

## 2017-12-09 DIAGNOSIS — R2689 Other abnormalities of gait and mobility: Secondary | ICD-10-CM | POA: Diagnosis not present

## 2017-12-09 DIAGNOSIS — N189 Chronic kidney disease, unspecified: Secondary | ICD-10-CM | POA: Diagnosis not present

## 2017-12-09 DIAGNOSIS — I2510 Atherosclerotic heart disease of native coronary artery without angina pectoris: Secondary | ICD-10-CM | POA: Diagnosis not present

## 2017-12-09 DIAGNOSIS — E785 Hyperlipidemia, unspecified: Secondary | ICD-10-CM | POA: Diagnosis not present

## 2017-12-09 DIAGNOSIS — R278 Other lack of coordination: Secondary | ICD-10-CM | POA: Diagnosis not present

## 2017-12-09 DIAGNOSIS — M6281 Muscle weakness (generalized): Secondary | ICD-10-CM | POA: Diagnosis not present

## 2017-12-10 DIAGNOSIS — G4733 Obstructive sleep apnea (adult) (pediatric): Secondary | ICD-10-CM | POA: Diagnosis not present

## 2017-12-10 DIAGNOSIS — J449 Chronic obstructive pulmonary disease, unspecified: Secondary | ICD-10-CM | POA: Diagnosis not present

## 2017-12-10 DIAGNOSIS — I509 Heart failure, unspecified: Secondary | ICD-10-CM | POA: Diagnosis not present

## 2017-12-10 DIAGNOSIS — R41841 Cognitive communication deficit: Secondary | ICD-10-CM | POA: Diagnosis not present

## 2017-12-10 DIAGNOSIS — I2510 Atherosclerotic heart disease of native coronary artery without angina pectoris: Secondary | ICD-10-CM | POA: Diagnosis not present

## 2017-12-10 DIAGNOSIS — G629 Polyneuropathy, unspecified: Secondary | ICD-10-CM | POA: Diagnosis not present

## 2017-12-10 DIAGNOSIS — E785 Hyperlipidemia, unspecified: Secondary | ICD-10-CM | POA: Diagnosis not present

## 2017-12-10 DIAGNOSIS — R600 Localized edema: Secondary | ICD-10-CM | POA: Diagnosis not present

## 2017-12-10 DIAGNOSIS — M069 Rheumatoid arthritis, unspecified: Secondary | ICD-10-CM | POA: Diagnosis not present

## 2017-12-10 DIAGNOSIS — N189 Chronic kidney disease, unspecified: Secondary | ICD-10-CM | POA: Diagnosis not present

## 2017-12-10 DIAGNOSIS — R278 Other lack of coordination: Secondary | ICD-10-CM | POA: Diagnosis not present

## 2017-12-10 DIAGNOSIS — K219 Gastro-esophageal reflux disease without esophagitis: Secondary | ICD-10-CM | POA: Diagnosis not present

## 2017-12-10 DIAGNOSIS — I82409 Acute embolism and thrombosis of unspecified deep veins of unspecified lower extremity: Secondary | ICD-10-CM | POA: Diagnosis not present

## 2017-12-10 DIAGNOSIS — R2689 Other abnormalities of gait and mobility: Secondary | ICD-10-CM | POA: Diagnosis not present

## 2017-12-10 DIAGNOSIS — I872 Venous insufficiency (chronic) (peripheral): Secondary | ICD-10-CM | POA: Diagnosis not present

## 2017-12-10 DIAGNOSIS — M6281 Muscle weakness (generalized): Secondary | ICD-10-CM | POA: Diagnosis not present

## 2017-12-10 DIAGNOSIS — E1122 Type 2 diabetes mellitus with diabetic chronic kidney disease: Secondary | ICD-10-CM | POA: Diagnosis not present

## 2017-12-10 DIAGNOSIS — F0390 Unspecified dementia without behavioral disturbance: Secondary | ICD-10-CM | POA: Diagnosis not present

## 2017-12-10 DIAGNOSIS — I10 Essential (primary) hypertension: Secondary | ICD-10-CM | POA: Diagnosis not present

## 2017-12-10 DIAGNOSIS — I110 Hypertensive heart disease with heart failure: Secondary | ICD-10-CM | POA: Diagnosis not present

## 2017-12-10 DIAGNOSIS — G4730 Sleep apnea, unspecified: Secondary | ICD-10-CM | POA: Diagnosis not present

## 2017-12-13 DIAGNOSIS — M6281 Muscle weakness (generalized): Secondary | ICD-10-CM | POA: Diagnosis not present

## 2017-12-13 DIAGNOSIS — E785 Hyperlipidemia, unspecified: Secondary | ICD-10-CM | POA: Diagnosis not present

## 2017-12-13 DIAGNOSIS — N189 Chronic kidney disease, unspecified: Secondary | ICD-10-CM | POA: Diagnosis not present

## 2017-12-13 DIAGNOSIS — R2689 Other abnormalities of gait and mobility: Secondary | ICD-10-CM | POA: Diagnosis not present

## 2017-12-13 DIAGNOSIS — R278 Other lack of coordination: Secondary | ICD-10-CM | POA: Diagnosis not present

## 2017-12-13 DIAGNOSIS — I2510 Atherosclerotic heart disease of native coronary artery without angina pectoris: Secondary | ICD-10-CM | POA: Diagnosis not present

## 2017-12-14 DIAGNOSIS — E785 Hyperlipidemia, unspecified: Secondary | ICD-10-CM | POA: Diagnosis not present

## 2017-12-14 DIAGNOSIS — M6281 Muscle weakness (generalized): Secondary | ICD-10-CM | POA: Diagnosis not present

## 2017-12-14 DIAGNOSIS — I2510 Atherosclerotic heart disease of native coronary artery without angina pectoris: Secondary | ICD-10-CM | POA: Diagnosis not present

## 2017-12-14 DIAGNOSIS — R2689 Other abnormalities of gait and mobility: Secondary | ICD-10-CM | POA: Diagnosis not present

## 2017-12-14 DIAGNOSIS — R278 Other lack of coordination: Secondary | ICD-10-CM | POA: Diagnosis not present

## 2017-12-14 DIAGNOSIS — N189 Chronic kidney disease, unspecified: Secondary | ICD-10-CM | POA: Diagnosis not present

## 2017-12-15 DIAGNOSIS — N189 Chronic kidney disease, unspecified: Secondary | ICD-10-CM | POA: Diagnosis not present

## 2017-12-15 DIAGNOSIS — M6281 Muscle weakness (generalized): Secondary | ICD-10-CM | POA: Diagnosis not present

## 2017-12-15 DIAGNOSIS — E785 Hyperlipidemia, unspecified: Secondary | ICD-10-CM | POA: Diagnosis not present

## 2017-12-15 DIAGNOSIS — R278 Other lack of coordination: Secondary | ICD-10-CM | POA: Diagnosis not present

## 2017-12-15 DIAGNOSIS — F419 Anxiety disorder, unspecified: Secondary | ICD-10-CM | POA: Diagnosis not present

## 2017-12-15 DIAGNOSIS — I2510 Atherosclerotic heart disease of native coronary artery without angina pectoris: Secondary | ICD-10-CM | POA: Diagnosis not present

## 2017-12-15 DIAGNOSIS — R2689 Other abnormalities of gait and mobility: Secondary | ICD-10-CM | POA: Diagnosis not present

## 2017-12-16 DIAGNOSIS — L853 Xerosis cutis: Secondary | ICD-10-CM | POA: Diagnosis not present

## 2017-12-16 DIAGNOSIS — R278 Other lack of coordination: Secondary | ICD-10-CM | POA: Diagnosis not present

## 2017-12-16 DIAGNOSIS — B351 Tinea unguium: Secondary | ICD-10-CM | POA: Diagnosis not present

## 2017-12-16 DIAGNOSIS — M6281 Muscle weakness (generalized): Secondary | ICD-10-CM | POA: Diagnosis not present

## 2017-12-16 DIAGNOSIS — R2689 Other abnormalities of gait and mobility: Secondary | ICD-10-CM | POA: Diagnosis not present

## 2017-12-16 DIAGNOSIS — Z6838 Body mass index (BMI) 38.0-38.9, adult: Secondary | ICD-10-CM | POA: Diagnosis not present

## 2017-12-16 DIAGNOSIS — N183 Chronic kidney disease, stage 3 (moderate): Secondary | ICD-10-CM | POA: Diagnosis not present

## 2017-12-16 DIAGNOSIS — M0589 Other rheumatoid arthritis with rheumatoid factor of multiple sites: Secondary | ICD-10-CM | POA: Diagnosis not present

## 2017-12-16 DIAGNOSIS — N189 Chronic kidney disease, unspecified: Secondary | ICD-10-CM | POA: Diagnosis not present

## 2017-12-16 DIAGNOSIS — I2510 Atherosclerotic heart disease of native coronary artery without angina pectoris: Secondary | ICD-10-CM | POA: Diagnosis not present

## 2017-12-16 DIAGNOSIS — M2010 Hallux valgus (acquired), unspecified foot: Secondary | ICD-10-CM | POA: Diagnosis not present

## 2017-12-16 DIAGNOSIS — E669 Obesity, unspecified: Secondary | ICD-10-CM | POA: Diagnosis not present

## 2017-12-16 DIAGNOSIS — M542 Cervicalgia: Secondary | ICD-10-CM | POA: Diagnosis not present

## 2017-12-16 DIAGNOSIS — E785 Hyperlipidemia, unspecified: Secondary | ICD-10-CM | POA: Diagnosis not present

## 2017-12-16 DIAGNOSIS — M1A09X0 Idiopathic chronic gout, multiple sites, without tophus (tophi): Secondary | ICD-10-CM | POA: Diagnosis not present

## 2017-12-16 DIAGNOSIS — I739 Peripheral vascular disease, unspecified: Secondary | ICD-10-CM | POA: Diagnosis not present

## 2017-12-16 DIAGNOSIS — L603 Nail dystrophy: Secondary | ICD-10-CM | POA: Diagnosis not present

## 2017-12-16 DIAGNOSIS — Q845 Enlarged and hypertrophic nails: Secondary | ICD-10-CM | POA: Diagnosis not present

## 2017-12-17 DIAGNOSIS — N189 Chronic kidney disease, unspecified: Secondary | ICD-10-CM | POA: Diagnosis not present

## 2017-12-17 DIAGNOSIS — E785 Hyperlipidemia, unspecified: Secondary | ICD-10-CM | POA: Diagnosis not present

## 2017-12-17 DIAGNOSIS — R2689 Other abnormalities of gait and mobility: Secondary | ICD-10-CM | POA: Diagnosis not present

## 2017-12-17 DIAGNOSIS — R278 Other lack of coordination: Secondary | ICD-10-CM | POA: Diagnosis not present

## 2017-12-17 DIAGNOSIS — M6281 Muscle weakness (generalized): Secondary | ICD-10-CM | POA: Diagnosis not present

## 2017-12-17 DIAGNOSIS — I2510 Atherosclerotic heart disease of native coronary artery without angina pectoris: Secondary | ICD-10-CM | POA: Diagnosis not present

## 2017-12-20 ENCOUNTER — Encounter: Payer: Self-pay | Admitting: Cardiology

## 2017-12-20 ENCOUNTER — Ambulatory Visit (INDEPENDENT_AMBULATORY_CARE_PROVIDER_SITE_OTHER): Payer: Medicare Other | Admitting: Cardiology

## 2017-12-20 VITALS — BP 92/48 | HR 106 | Ht 66.5 in | Wt 240.0 lb

## 2017-12-20 DIAGNOSIS — E785 Hyperlipidemia, unspecified: Secondary | ICD-10-CM | POA: Diagnosis not present

## 2017-12-20 DIAGNOSIS — I5032 Chronic diastolic (congestive) heart failure: Secondary | ICD-10-CM

## 2017-12-20 DIAGNOSIS — I714 Abdominal aortic aneurysm, without rupture, unspecified: Secondary | ICD-10-CM

## 2017-12-20 DIAGNOSIS — R911 Solitary pulmonary nodule: Secondary | ICD-10-CM | POA: Diagnosis not present

## 2017-12-20 DIAGNOSIS — I2584 Coronary atherosclerosis due to calcified coronary lesion: Secondary | ICD-10-CM

## 2017-12-20 DIAGNOSIS — I2510 Atherosclerotic heart disease of native coronary artery without angina pectoris: Secondary | ICD-10-CM

## 2017-12-20 DIAGNOSIS — N189 Chronic kidney disease, unspecified: Secondary | ICD-10-CM | POA: Diagnosis not present

## 2017-12-20 DIAGNOSIS — R278 Other lack of coordination: Secondary | ICD-10-CM | POA: Diagnosis not present

## 2017-12-20 DIAGNOSIS — M6281 Muscle weakness (generalized): Secondary | ICD-10-CM | POA: Diagnosis not present

## 2017-12-20 DIAGNOSIS — R2689 Other abnormalities of gait and mobility: Secondary | ICD-10-CM | POA: Diagnosis not present

## 2017-12-20 MED ORDER — FUROSEMIDE 40 MG PO TABS: 60.0000 mg | ORAL_TABLET | Freq: Every day | ORAL | Status: DC

## 2017-12-20 NOTE — Addendum Note (Signed)
Addended by: Raelyn Number on: 12/20/2017 03:09 PM   Modules accepted: Orders

## 2017-12-20 NOTE — Patient Instructions (Addendum)
Medication Instructions:  Your physician has recommended you make the following change in your medication:  INCREASE Lasix to 60mg  daily   If you need a refill on your cardiac medications before your next appointment, please call your pharmacy.   Lab work: Please have a bmet drawn in 1 week at [facility] and the results faxed to our office.  If you have labs (blood work) drawn today and your tests are completely normal, you will receive your results only by: [facility] MyChart Message (if you have MyChart) OR . A paper copy in the mail If you have any lab test that is abnormal or we need to change your treatment, we will call you to review the results.  Testing/Procedures: Non-Cardiac CT scanning, (CAT scanning), is a noninvasive, special x-ray that produces cross-sectional images of the body using x-rays and a computer. CT scans help physicians diagnose and treat medical conditions. For some CT exams, a contrast material is used to enhance visibility in the area of the body being studied. CT scans provide greater clarity and reveal more details than regular x-ray exams.  Your physician has requested that you have an abdominal aorta duplex. During this test, an ultrasound is used to evaluate the aorta. Allow 30 minutes for this exam. Do not eat after midnight the day before and avoid carbonated beverages    Follow-Up: At [facility], you and your health needs are our priority.  As part of our continuing mission to provide you with exceptional heart care, we have created designated Provider Care Teams.  These Care Teams include your primary Cardiologist (physician) and Advanced Practice Providers (APPs -  Physician Assistants and Nurse Practitioners) who all work together to provide you with the care you need, when you need it. Your physician recommends that you schedule a follow-up appointment in: 4 weeks with an APP  Your physician recommends that you schedule a follow-up appointment in: 3  months with Dr.Crenshaw    Any Other Special Instructions Will Be Listed Below (If Applicable).

## 2017-12-21 DIAGNOSIS — I2510 Atherosclerotic heart disease of native coronary artery without angina pectoris: Secondary | ICD-10-CM | POA: Diagnosis not present

## 2017-12-21 DIAGNOSIS — R278 Other lack of coordination: Secondary | ICD-10-CM | POA: Diagnosis not present

## 2017-12-21 DIAGNOSIS — N189 Chronic kidney disease, unspecified: Secondary | ICD-10-CM | POA: Diagnosis not present

## 2017-12-21 DIAGNOSIS — E785 Hyperlipidemia, unspecified: Secondary | ICD-10-CM | POA: Diagnosis not present

## 2017-12-21 DIAGNOSIS — R2689 Other abnormalities of gait and mobility: Secondary | ICD-10-CM | POA: Diagnosis not present

## 2017-12-21 DIAGNOSIS — M6281 Muscle weakness (generalized): Secondary | ICD-10-CM | POA: Diagnosis not present

## 2017-12-22 DIAGNOSIS — M6281 Muscle weakness (generalized): Secondary | ICD-10-CM | POA: Diagnosis not present

## 2017-12-22 DIAGNOSIS — E785 Hyperlipidemia, unspecified: Secondary | ICD-10-CM | POA: Diagnosis not present

## 2017-12-22 DIAGNOSIS — R278 Other lack of coordination: Secondary | ICD-10-CM | POA: Diagnosis not present

## 2017-12-22 DIAGNOSIS — I2510 Atherosclerotic heart disease of native coronary artery without angina pectoris: Secondary | ICD-10-CM | POA: Diagnosis not present

## 2017-12-22 DIAGNOSIS — R2689 Other abnormalities of gait and mobility: Secondary | ICD-10-CM | POA: Diagnosis not present

## 2017-12-22 DIAGNOSIS — N189 Chronic kidney disease, unspecified: Secondary | ICD-10-CM | POA: Diagnosis not present

## 2017-12-23 DIAGNOSIS — N189 Chronic kidney disease, unspecified: Secondary | ICD-10-CM | POA: Diagnosis not present

## 2017-12-23 DIAGNOSIS — I2510 Atherosclerotic heart disease of native coronary artery without angina pectoris: Secondary | ICD-10-CM | POA: Diagnosis not present

## 2017-12-23 DIAGNOSIS — M6281 Muscle weakness (generalized): Secondary | ICD-10-CM | POA: Diagnosis not present

## 2017-12-23 DIAGNOSIS — R2689 Other abnormalities of gait and mobility: Secondary | ICD-10-CM | POA: Diagnosis not present

## 2017-12-23 DIAGNOSIS — E785 Hyperlipidemia, unspecified: Secondary | ICD-10-CM | POA: Diagnosis not present

## 2017-12-23 DIAGNOSIS — R278 Other lack of coordination: Secondary | ICD-10-CM | POA: Diagnosis not present

## 2017-12-24 DIAGNOSIS — R2689 Other abnormalities of gait and mobility: Secondary | ICD-10-CM | POA: Diagnosis not present

## 2017-12-24 DIAGNOSIS — E785 Hyperlipidemia, unspecified: Secondary | ICD-10-CM | POA: Diagnosis not present

## 2017-12-24 DIAGNOSIS — N189 Chronic kidney disease, unspecified: Secondary | ICD-10-CM | POA: Diagnosis not present

## 2017-12-24 DIAGNOSIS — M6281 Muscle weakness (generalized): Secondary | ICD-10-CM | POA: Diagnosis not present

## 2017-12-24 DIAGNOSIS — I2510 Atherosclerotic heart disease of native coronary artery without angina pectoris: Secondary | ICD-10-CM | POA: Diagnosis not present

## 2017-12-24 DIAGNOSIS — R278 Other lack of coordination: Secondary | ICD-10-CM | POA: Diagnosis not present

## 2017-12-27 DIAGNOSIS — D649 Anemia, unspecified: Secondary | ICD-10-CM | POA: Diagnosis not present

## 2017-12-27 DIAGNOSIS — M6281 Muscle weakness (generalized): Secondary | ICD-10-CM | POA: Diagnosis not present

## 2017-12-27 DIAGNOSIS — E785 Hyperlipidemia, unspecified: Secondary | ICD-10-CM | POA: Diagnosis not present

## 2017-12-27 DIAGNOSIS — R2689 Other abnormalities of gait and mobility: Secondary | ICD-10-CM | POA: Diagnosis not present

## 2017-12-27 DIAGNOSIS — R278 Other lack of coordination: Secondary | ICD-10-CM | POA: Diagnosis not present

## 2017-12-27 DIAGNOSIS — Z79899 Other long term (current) drug therapy: Secondary | ICD-10-CM | POA: Diagnosis not present

## 2017-12-27 DIAGNOSIS — N189 Chronic kidney disease, unspecified: Secondary | ICD-10-CM | POA: Diagnosis not present

## 2017-12-27 DIAGNOSIS — I2510 Atherosclerotic heart disease of native coronary artery without angina pectoris: Secondary | ICD-10-CM | POA: Diagnosis not present

## 2017-12-28 DIAGNOSIS — I2510 Atherosclerotic heart disease of native coronary artery without angina pectoris: Secondary | ICD-10-CM | POA: Diagnosis not present

## 2017-12-28 DIAGNOSIS — N189 Chronic kidney disease, unspecified: Secondary | ICD-10-CM | POA: Diagnosis not present

## 2017-12-28 DIAGNOSIS — R278 Other lack of coordination: Secondary | ICD-10-CM | POA: Diagnosis not present

## 2017-12-28 DIAGNOSIS — M6281 Muscle weakness (generalized): Secondary | ICD-10-CM | POA: Diagnosis not present

## 2017-12-28 DIAGNOSIS — E785 Hyperlipidemia, unspecified: Secondary | ICD-10-CM | POA: Diagnosis not present

## 2017-12-28 DIAGNOSIS — R2689 Other abnormalities of gait and mobility: Secondary | ICD-10-CM | POA: Diagnosis not present

## 2017-12-29 ENCOUNTER — Other Ambulatory Visit: Payer: Self-pay

## 2017-12-29 DIAGNOSIS — F419 Anxiety disorder, unspecified: Secondary | ICD-10-CM | POA: Diagnosis not present

## 2017-12-29 DIAGNOSIS — I2510 Atherosclerotic heart disease of native coronary artery without angina pectoris: Secondary | ICD-10-CM | POA: Diagnosis not present

## 2017-12-29 DIAGNOSIS — R2689 Other abnormalities of gait and mobility: Secondary | ICD-10-CM | POA: Diagnosis not present

## 2017-12-29 DIAGNOSIS — E785 Hyperlipidemia, unspecified: Secondary | ICD-10-CM | POA: Diagnosis not present

## 2017-12-29 DIAGNOSIS — R278 Other lack of coordination: Secondary | ICD-10-CM | POA: Diagnosis not present

## 2017-12-29 DIAGNOSIS — N189 Chronic kidney disease, unspecified: Secondary | ICD-10-CM | POA: Diagnosis not present

## 2017-12-29 DIAGNOSIS — M6281 Muscle weakness (generalized): Secondary | ICD-10-CM | POA: Diagnosis not present

## 2017-12-30 DIAGNOSIS — I2510 Atherosclerotic heart disease of native coronary artery without angina pectoris: Secondary | ICD-10-CM | POA: Diagnosis not present

## 2017-12-30 DIAGNOSIS — R600 Localized edema: Secondary | ICD-10-CM | POA: Diagnosis not present

## 2017-12-30 DIAGNOSIS — M069 Rheumatoid arthritis, unspecified: Secondary | ICD-10-CM | POA: Diagnosis not present

## 2017-12-30 DIAGNOSIS — R7989 Other specified abnormal findings of blood chemistry: Secondary | ICD-10-CM | POA: Diagnosis not present

## 2017-12-30 DIAGNOSIS — M6281 Muscle weakness (generalized): Secondary | ICD-10-CM | POA: Diagnosis not present

## 2017-12-30 DIAGNOSIS — R2689 Other abnormalities of gait and mobility: Secondary | ICD-10-CM | POA: Diagnosis not present

## 2017-12-30 DIAGNOSIS — N189 Chronic kidney disease, unspecified: Secondary | ICD-10-CM | POA: Diagnosis not present

## 2017-12-30 DIAGNOSIS — E785 Hyperlipidemia, unspecified: Secondary | ICD-10-CM | POA: Diagnosis not present

## 2017-12-30 DIAGNOSIS — R278 Other lack of coordination: Secondary | ICD-10-CM | POA: Diagnosis not present

## 2017-12-31 DIAGNOSIS — M069 Rheumatoid arthritis, unspecified: Secondary | ICD-10-CM | POA: Diagnosis not present

## 2017-12-31 DIAGNOSIS — N183 Chronic kidney disease, stage 3 (moderate): Secondary | ICD-10-CM | POA: Diagnosis not present

## 2017-12-31 DIAGNOSIS — R278 Other lack of coordination: Secondary | ICD-10-CM | POA: Diagnosis not present

## 2017-12-31 DIAGNOSIS — M6281 Muscle weakness (generalized): Secondary | ICD-10-CM | POA: Diagnosis not present

## 2017-12-31 DIAGNOSIS — R2689 Other abnormalities of gait and mobility: Secondary | ICD-10-CM | POA: Diagnosis not present

## 2017-12-31 DIAGNOSIS — I5032 Chronic diastolic (congestive) heart failure: Secondary | ICD-10-CM | POA: Diagnosis not present

## 2017-12-31 DIAGNOSIS — I2510 Atherosclerotic heart disease of native coronary artery without angina pectoris: Secondary | ICD-10-CM | POA: Diagnosis not present

## 2017-12-31 DIAGNOSIS — E785 Hyperlipidemia, unspecified: Secondary | ICD-10-CM | POA: Diagnosis not present

## 2017-12-31 DIAGNOSIS — M25421 Effusion, right elbow: Secondary | ICD-10-CM | POA: Diagnosis not present

## 2017-12-31 DIAGNOSIS — N189 Chronic kidney disease, unspecified: Secondary | ICD-10-CM | POA: Diagnosis not present

## 2018-01-01 DIAGNOSIS — M25521 Pain in right elbow: Secondary | ICD-10-CM | POA: Diagnosis not present

## 2018-01-02 DIAGNOSIS — R945 Abnormal results of liver function studies: Secondary | ICD-10-CM | POA: Diagnosis not present

## 2018-01-04 ENCOUNTER — Ambulatory Visit (INDEPENDENT_AMBULATORY_CARE_PROVIDER_SITE_OTHER)
Admission: RE | Admit: 2018-01-04 | Discharge: 2018-01-04 | Disposition: A | Discharge status: Home / Self Care | Payer: Medicare Other | Source: Ambulatory Visit | Attending: Cardiology | Admitting: Cardiology

## 2018-01-04 DIAGNOSIS — R918 Other nonspecific abnormal finding of lung field: Secondary | ICD-10-CM | POA: Diagnosis not present

## 2018-01-04 DIAGNOSIS — R911 Solitary pulmonary nodule: Secondary | ICD-10-CM | POA: Diagnosis not present

## 2018-01-10 DIAGNOSIS — K760 Fatty (change of) liver, not elsewhere classified: Secondary | ICD-10-CM | POA: Diagnosis not present

## 2018-01-10 DIAGNOSIS — R945 Abnormal results of liver function studies: Secondary | ICD-10-CM | POA: Diagnosis not present

## 2018-01-10 DIAGNOSIS — I5032 Chronic diastolic (congestive) heart failure: Secondary | ICD-10-CM | POA: Diagnosis not present

## 2018-01-10 DIAGNOSIS — M7021 Olecranon bursitis, right elbow: Secondary | ICD-10-CM | POA: Diagnosis not present

## 2018-01-11 ENCOUNTER — Ambulatory Visit (HOSPITAL_COMMUNITY)
Admission: RE | Admit: 2018-01-11 | Discharge: 2018-01-11 | Disposition: A | Discharge status: Home / Self Care | Payer: Medicare Other | Source: Ambulatory Visit | Attending: Cardiovascular Disease | Admitting: Cardiovascular Disease

## 2018-01-11 DIAGNOSIS — I714 Abdominal aortic aneurysm, without rupture, unspecified: Secondary | ICD-10-CM

## 2018-01-12 ENCOUNTER — Other Ambulatory Visit: Payer: Self-pay | Admitting: *Deleted

## 2018-01-12 DIAGNOSIS — I5032 Chronic diastolic (congestive) heart failure: Secondary | ICD-10-CM | POA: Diagnosis not present

## 2018-01-12 DIAGNOSIS — I714 Abdominal aortic aneurysm, without rupture, unspecified: Secondary | ICD-10-CM

## 2018-01-12 DIAGNOSIS — I129 Hypertensive chronic kidney disease with stage 1 through stage 4 chronic kidney disease, or unspecified chronic kidney disease: Secondary | ICD-10-CM | POA: Diagnosis not present

## 2018-01-12 DIAGNOSIS — E876 Hypokalemia: Secondary | ICD-10-CM | POA: Diagnosis not present

## 2018-01-12 DIAGNOSIS — R319 Hematuria, unspecified: Secondary | ICD-10-CM | POA: Diagnosis not present

## 2018-01-12 DIAGNOSIS — R339 Retention of urine, unspecified: Secondary | ICD-10-CM | POA: Diagnosis not present

## 2018-01-12 DIAGNOSIS — N183 Chronic kidney disease, stage 3 (moderate): Secondary | ICD-10-CM | POA: Diagnosis not present

## 2018-01-13 DIAGNOSIS — R339 Retention of urine, unspecified: Secondary | ICD-10-CM | POA: Diagnosis not present

## 2018-01-18 ENCOUNTER — Other Ambulatory Visit: Payer: Self-pay | Admitting: Nephrology

## 2018-01-18 DIAGNOSIS — R339 Retention of urine, unspecified: Secondary | ICD-10-CM

## 2018-01-19 DIAGNOSIS — F330 Major depressive disorder, recurrent, mild: Secondary | ICD-10-CM | POA: Diagnosis not present

## 2018-01-19 DIAGNOSIS — F4321 Adjustment disorder with depressed mood: Secondary | ICD-10-CM | POA: Diagnosis not present

## 2018-01-20 ENCOUNTER — Telehealth: Payer: Self-pay | Admitting: Cardiology

## 2018-01-20 NOTE — Telephone Encounter (Signed)
Received  Notes from [facility] on 01/20/18, Appt 01/25/18 @ 2:30PM. [place]

## 2018-01-21 ENCOUNTER — Other Ambulatory Visit: Payer: Medicare Other

## 2018-01-24 DIAGNOSIS — I5032 Chronic diastolic (congestive) heart failure: Secondary | ICD-10-CM | POA: Diagnosis not present

## 2018-01-24 DIAGNOSIS — M069 Rheumatoid arthritis, unspecified: Secondary | ICD-10-CM | POA: Diagnosis not present

## 2018-01-24 DIAGNOSIS — L299 Pruritus, unspecified: Secondary | ICD-10-CM | POA: Diagnosis not present

## 2018-01-24 DIAGNOSIS — J449 Chronic obstructive pulmonary disease, unspecified: Secondary | ICD-10-CM | POA: Diagnosis not present

## 2018-01-25 ENCOUNTER — Encounter (INDEPENDENT_AMBULATORY_CARE_PROVIDER_SITE_OTHER): Payer: Self-pay

## 2018-01-25 ENCOUNTER — Ambulatory Visit (INDEPENDENT_AMBULATORY_CARE_PROVIDER_SITE_OTHER): Payer: Medicare Other | Admitting: Cardiology

## 2018-01-25 ENCOUNTER — Encounter: Payer: Self-pay | Admitting: Cardiology

## 2018-01-25 VITALS — BP 110/65 | HR 101 | Ht 67.0 in | Wt 220.0 lb

## 2018-01-25 DIAGNOSIS — J449 Chronic obstructive pulmonary disease, unspecified: Secondary | ICD-10-CM | POA: Diagnosis not present

## 2018-01-25 DIAGNOSIS — N183 Chronic kidney disease, stage 3 unspecified: Secondary | ICD-10-CM | POA: Insufficient documentation

## 2018-01-25 DIAGNOSIS — I2510 Atherosclerotic heart disease of native coronary artery without angina pectoris: Secondary | ICD-10-CM | POA: Diagnosis not present

## 2018-01-25 DIAGNOSIS — I2584 Coronary atherosclerosis due to calcified coronary lesion: Secondary | ICD-10-CM | POA: Diagnosis not present

## 2018-01-25 DIAGNOSIS — I5033 Acute on chronic diastolic (congestive) heart failure: Secondary | ICD-10-CM

## 2018-01-25 NOTE — Patient Instructions (Signed)
Medication Instructions:  Your physician recommends that you continue on your current medications as directed. Please refer to the Current Medication list given to you today. If you need a refill on your cardiac medications before your next appointment, please call your pharmacy.   Lab work: NONE  If you have labs (blood work) drawn today and your tests are completely normal, you will receive your results only by: Marland Kitchen MyChart Message (if you have MyChart) OR . A paper copy in the mail If you have any lab test that is abnormal or we need to change your treatment, we will call you to review the results.  Testing/Procedures: NONE   Follow-Up: At [facility], you and your health needs are our priority.  As part of our continuing mission to provide you with exceptional heart care, we have created designated Provider Care Teams.  These Care Teams include your primary Cardiologist (physician) and Advanced Practice Providers (APPs -  Physician Assistants and Nurse Practitioners) who all work together to provide you with the care you need, when you need it. Marland Kitchen [facility] RECOMMENDS THAT YOU FOLLOW UP WITH DR Jens Som AS SCHEDULED  Any Other Special Instructions Will Be Listed Below (If Applicable).

## 2018-01-25 NOTE — Progress Notes (Signed)
01/25/2018 Chad Duke   1945-04-01  [id number]  Primary Physician Georgann Housekeeper, MD Primary Cardiologist: Dr Jens Som  HPI:  72 y/o chronically ill obese male, he worked as a Management consultant, followed by Dr Gracy Bruins at [facility] with a history of Rheumatoid arthritis (Dr Marzetta Merino), NIDDM, HLD, DVT RLE and PE June 2019-on Eliquis, CRI-3 obesity and OSA,  He saw Dr Jens Som in April 2019 for coronary Ca++ on CT scan. Myoview was low risk. Echo done Feb 2019 showed normal LVF, mild LVH, grade 1 DD. The pt also has an AAA- 3.0 cm, followed by Dr Arbie Cookey.    He has had multiple admissions to the hospital with weakness. He was seen in the ED in consult 09/02/17 for acute on chronic renal insufficiency which we felt was secondary to diuretics. He was admitted by the hospitalitis and followed. He was seen by Palliative Care and made it know he wishes to be a DNR.  He has since been admitted to [facility]. Dr Jens Som saw him 12/20/17 and increased his Lasix to 60 mg daily. He is in the office today for follow up. He says he is getting stronger. He is still on O2 and using a walker. His weight is down 20 lbs to 220 lbs. He was 240 lbs on 12/20/17, and peaked at 276 lbs back in July 2019.  He followed up with Dr Vallery Sa at [facility] on 01/12/18. His renal function is stable.  She has ordered a bladder scan which he has not done yet.    Current Outpatient Medications  Medication Sig Dispense Refill  . albuterol (PROVENTIL HFA;VENTOLIN HFA) 108 (90 Base) MCG/ACT inhaler Inhale 2 puffs into the lungs every 4 (four) hours as needed for wheezing or shortness of breath. 3 Inhaler 1  . apixaban (ELIQUIS) 5 MG TABS tablet Take 1 tablet (5 mg total) by mouth 2 (two) times daily. 1 tablet 0  . Ascorbic Acid (VITAMIN C) 1000 MG tablet Take 1,000 mg by mouth daily.    Marland Kitchen atorvastatin (LIPITOR) 40 MG tablet Take 1 tablet (40 mg total) by mouth daily. 90 tablet 3  . budesonide  (PULMICORT) 0.5 MG/2ML nebulizer solution Take 0.5 mg by nebulization 2 (two) times daily.    . Calcium-Magnesium-Vitamin D (CITRACAL CALCIUM+D) 600-40-500 MG-MG-UNIT TB24 Take 1 tablet by mouth daily.      . cetirizine (ZYRTEC) 10 MG tablet Take 10 mg by mouth daily.      . cyanocobalamin 2000 MCG tablet Take 2,500 mcg by mouth daily.     . diphenhydrAMINE (BENADRYL) 25 mg capsule Take 25 mg by mouth every 6 (six) hours as needed.    . folic acid (FOLVITE) 1 MG tablet Take 1 mg by mouth daily.    . furosemide (LASIX) 40 MG tablet Take 1.5 tablets (60 mg total) by mouth daily.    Marland Kitchen guaiFENesin (MUCINEX) 600 MG 12 hr tablet Take 1,200 mg by mouth 2 (two) times daily.    . insulin aspart (NOVOLOG FLEXPEN) 100 UNIT/ML FlexPen Use as directed 101-150=1U 151-200=2U 201-250=3U 251-300=5U 301-350=7U >350=9U    . insulin detemir (LEVEMIR) 100 UNIT/ML injection Inject 24 Units into the skin daily.    Marland Kitchen ipratropium-albuterol (DUONEB) 0.5-2.5 (3) MG/3ML SOLN Take 3 mLs by nebulization every 6 (six) hours.     . Magnesium 400 MG CAPS Take 1 capsule by mouth daily.    . Multiple Vitamin (MULTIVITAMIN) tablet Take 1 tablet by mouth daily.      Marland Kitchen  OXYGEN 2 L. BIPAP with 1lpm o2     . pantoprazole (PROTONIX) 40 MG tablet Take 40 mg by mouth 2 (two) times daily.     . polyethylene glycol (MIRALAX) packet Take 17 g by mouth daily. 5 each 0  . potassium chloride SA (K-DUR,KLOR-CON) 20 MEQ tablet Take 40 mEq by mouth daily.    . sodium chloride (OCEAN) 0.65 % SOLN nasal spray Place 1 spray into both nostrils at bedtime.     . Tiotropium Bromide Monohydrate (SPIRIVA RESPIMAT) 2.5 MCG/ACT AERS Inhale 2 puffs into the lungs daily. 1 Inhaler 0   No current facility-administered medications for this visit.     Allergies  Allergen Reactions  . Chantix [Varenicline] Other (See Comments)    Suicidal thoughts    Past Medical History:  Diagnosis Date  . AAA (abdominal aortic aneurysm) (HCC)   . Allergy     Rhinitis  . Arthritis    Rhuematoid  . Asthma   . Chronic renal insufficiency   . Diverticulosis   . Emphysema   . Falls frequently   . GERD (gastroesophageal reflux disease)   . Hearing loss of both ears   . Hiatal hernia   . Hyperlipidemia   . Hypertension   . Male hypogonadism   . Nephrolithiasis   . Nephrolithiasis   . Sleep apnea    On CPAP  . Stasis dermatitis   . Varicose veins   . Vitamin B 12 deficiency     Social History   Socioeconomic History  . Marital status: Divorced    Spouse name: Not on file  . Number of children: 2  . Years of education: Not on file  . Highest education level: Not on file  Occupational History  . Not on file  Social Needs  . Financial resource strain: Not on file  . Food insecurity:    Worry: Not on file    Inability: Not on file  . Transportation needs:    Medical: Not on file    Non-medical: Not on file  Tobacco Use  . Smoking status: Former Smoker    Packs/day: 0.10    Years: 53.00    Pack years: 5.30    Types: Cigarettes    Last attempt to quit: 08/11/2017    Years since quitting: 0.4  . Smokeless tobacco: Never Used  . Tobacco comment: 1-2 cigarettes a day   Substance and Sexual Activity  . Alcohol use: Not Currently    Alcohol/week: 0.0 standard drinks  . Drug use: No  . Sexual activity: Not on file  Lifestyle  . Physical activity:    Days per week: Not on file    Minutes per session: Not on file  . Stress: Not on file  Relationships  . Social connections:    Talks on phone: Not on file    Gets together: Not on file    Attends religious service: Not on file    Active member of club or organization: Not on file    Attends meetings of clubs or organizations: Not on file    Relationship status: Not on file  . Intimate partner violence:    Fear of current or ex partner: Not on file    Emotionally abused: Not on file    Physically abused: Not on file    Forced sexual activity: Not on file  Other Topics  Concern  . Not on file  Social History Narrative  . Not on file  Family History  Problem Relation Age of Onset  . Emphysema Father   . Asthma Father   . Osteoarthritis Father   . Heart attack Mother   . Uterine cancer Mother   . Heart disease Mother   . ADD / ADHD Son   . Depression Son   . Hypertension Brother   . Heart disease Brother        Pacemaker   . Heart murmur Brother      Review of Systems: General: negative for chills, fever, night sweats or weight changes.  Cardiovascular: negative for chest pain, dyspnea on exertion, edema, orthopnea, palpitations, paroxysmal nocturnal dyspnea or shortness of breath Dermatological: negative for rash Respiratory: negative for cough or wheezing Urologic: negative for hematuria Abdominal: negative for nausea, vomiting, diarrhea, bright red blood per rectum, melena, or hematemesis Neurologic: negative for visual changes, syncope, or dizziness All other systems reviewed and are otherwise negative except as noted above.    Blood pressure 110/65, height 5\' 7"  (1.702 m), weight 220 lb (99.8 kg).  General appearance: alert, cooperative, no distress, moderately obese and on O2, uses a walker Lungs: scattered rhonchi Heart: regular rate and rhythm and 1/6 systolic murmur AOV Extremities: trace edema Skin: pale, warm and dry Neurologic: Grossly normal   ASSESSMENT AND PLAN:   Acute on Chronic diastolic CHF (congestive heart failure) (HCC) Echo repeated June 2019- Normal LVF-EF 65-70%, mild LVH, grade 1 DD Weight down 20 lbs on Lasix 60 mg daily  Essential hypertension Stable  History of pulmonary embolus (PE) PE June 2019 as well as DVT- he is on Eliquis  CAD (coronary artery disease) Coronary Ca++ on CT scan, Myoview low risk, no angina  Chronic hypoxia/ COPD Chronic O2- O2 sat dropped to 88 on RA today after he ambulated to the BR  Deconditioning He uses a walker- "getting stronger" no history of falls or  syncope.   CRI-3 Recent BUN Cr 13/ 1.34 at [facility] wanted the patient to return today for follow-up after a recent increase in his diuretics.  The patient seems to be doing well on his current dose of Lasix.  Recent BUN and creatinine done at [facility], were stable. The patient asked if he could increase his diuretics further and I told him I would not recommend that.    The patient wants to go back to work.  He tells me he thinks he will be out of [facility] by the end of the month.  He needed a note to say that there was no cardiac conditions that would prohibit him from driving a vehicle and I provided him this.  He has a follow-up with Dr. [facility] in March and will keep this.  Hopefully he will continue to do well once he is discharged from a supervised facility.  April PA-C 01/25/2018 2:35 PM

## 2018-01-26 DIAGNOSIS — F419 Anxiety disorder, unspecified: Secondary | ICD-10-CM | POA: Diagnosis not present

## 2018-01-27 ENCOUNTER — Ambulatory Visit
Admission: RE | Admit: 2018-01-27 | Discharge: 2018-01-27 | Disposition: A | Discharge status: Home / Self Care | Payer: Medicare Other | Source: Ambulatory Visit | Attending: Nephrology | Admitting: Nephrology

## 2018-01-27 DIAGNOSIS — N200 Calculus of kidney: Secondary | ICD-10-CM | POA: Diagnosis not present

## 2018-01-27 DIAGNOSIS — R339 Retention of urine, unspecified: Secondary | ICD-10-CM

## 2018-02-01 DIAGNOSIS — N183 Chronic kidney disease, stage 3 (moderate): Secondary | ICD-10-CM | POA: Diagnosis not present

## 2018-02-01 DIAGNOSIS — I509 Heart failure, unspecified: Secondary | ICD-10-CM | POA: Diagnosis not present

## 2018-02-01 DIAGNOSIS — J449 Chronic obstructive pulmonary disease, unspecified: Secondary | ICD-10-CM | POA: Diagnosis not present

## 2018-02-01 DIAGNOSIS — I4891 Unspecified atrial fibrillation: Secondary | ICD-10-CM | POA: Diagnosis not present

## 2018-02-01 DIAGNOSIS — M069 Rheumatoid arthritis, unspecified: Secondary | ICD-10-CM | POA: Diagnosis not present

## 2018-02-01 DIAGNOSIS — F0390 Unspecified dementia without behavioral disturbance: Secondary | ICD-10-CM | POA: Diagnosis not present

## 2018-02-01 DIAGNOSIS — I2510 Atherosclerotic heart disease of native coronary artery without angina pectoris: Secondary | ICD-10-CM | POA: Diagnosis not present

## 2018-02-01 DIAGNOSIS — E1122 Type 2 diabetes mellitus with diabetic chronic kidney disease: Secondary | ICD-10-CM | POA: Diagnosis not present

## 2018-02-01 DIAGNOSIS — I872 Venous insufficiency (chronic) (peripheral): Secondary | ICD-10-CM | POA: Diagnosis not present

## 2018-02-01 DIAGNOSIS — G4733 Obstructive sleep apnea (adult) (pediatric): Secondary | ICD-10-CM | POA: Diagnosis not present

## 2018-02-03 DIAGNOSIS — Z79899 Other long term (current) drug therapy: Secondary | ICD-10-CM | POA: Diagnosis not present

## 2018-02-08 DIAGNOSIS — F419 Anxiety disorder, unspecified: Secondary | ICD-10-CM | POA: Diagnosis not present

## 2018-04-08 NOTE — Progress Notes (Signed)
HPI: FU CAD, pulmonary embolus and diastolic CHF. Chest CT 2/19 showed three-vessel coronary calcification. Nuclear study June 2019 showed ejection fraction 76% and no ischemia.  Echocardiogram June 2019 showed vigorous LV function, mild left ventricular hypertrophy, grade 1 diastolic dysfunction, very mild aortic stenosis and mild biatrial enlargement.  Venous Dopplers June 2019 showed chronic versus age-indeterminate deep vein thrombosis in the popliteal vein on the right and acute versus age-indeterminate DVT on the left.  CTA June 2019 showed left lower lobe pulmonary emboli. Lasix stopped at time of previous hospitalization because of acute on chronic renal insufficiency.  Patient is a no CODE BLUE. Abdominal ultrasound December 2019 showed 3.5 cm abdominal aortic aneurysm.  Since last seen he does have some dyspnea on exertion but no orthopnea or PND.  Chronic pedal edema.  Current Outpatient Medications  Medication Sig Dispense Refill  . albuterol (PROVENTIL HFA;VENTOLIN HFA) 108 (90 Base) MCG/ACT inhaler Inhale 2 puffs into the lungs every 4 (four) hours as needed for wheezing or shortness of breath. 3 Inhaler 1  . apixaban (ELIQUIS) 5 MG TABS tablet Take 1 tablet (5 mg total) by mouth 2 (two) times daily. 1 tablet 0  . Ascorbic Acid (VITAMIN C) 1000 MG tablet Take 1,000 mg by mouth daily.    Marland Kitchen atorvastatin (LIPITOR) 40 MG tablet Take 1 tablet (40 mg total) by mouth daily. 90 tablet 3  . budesonide (PULMICORT) 0.5 MG/2ML nebulizer solution Take 0.5 mg by nebulization 2 (two) times daily.    . Calcium-Magnesium-Vitamin D (CITRACAL CALCIUM+D) 600-40-500 MG-MG-UNIT TB24 Take 1 tablet by mouth daily.      . cetirizine (ZYRTEC) 10 MG tablet Take 10 mg by mouth daily.      . cyanocobalamin 2000 MCG tablet Take 2,500 mcg by mouth daily.     . diphenhydrAMINE (BENADRYL) 25 mg capsule Take 25 mg by mouth every 6 (six) hours as needed.    . folic acid (FOLVITE) 1 MG tablet Take 1 mg by mouth  daily.    . furosemide (LASIX) 40 MG tablet Take 1.5 tablets (60 mg total) by mouth daily.    Marland Kitchen guaiFENesin (MUCINEX) 600 MG 12 hr tablet Take 1,200 mg by mouth 2 (two) times daily.    . insulin aspart (NOVOLOG FLEXPEN) 100 UNIT/ML FlexPen Use as directed 101-150=1U 151-200=2U 201-250=3U 251-300=5U 301-350=7U >350=9U    . insulin detemir (LEVEMIR) 100 UNIT/ML injection Inject 24 Units into the skin daily.    Marland Kitchen ipratropium-albuterol (DUONEB) 0.5-2.5 (3) MG/3ML SOLN Take 3 mLs by nebulization every 6 (six) hours.     . Magnesium 400 MG CAPS Take 1 capsule by mouth daily.    . Multiple Vitamin (MULTIVITAMIN) tablet Take 1 tablet by mouth daily.      . OXYGEN 2 L. BIPAP with 1lpm o2     . pantoprazole (PROTONIX) 40 MG tablet Take 40 mg by mouth 2 (two) times daily.     . polyethylene glycol (MIRALAX) packet Take 17 g by mouth daily. 5 each 0  . potassium chloride SA (K-DUR,KLOR-CON) 20 MEQ tablet Take 40 mEq by mouth daily.    . sodium chloride (OCEAN) 0.65 % SOLN nasal spray Place 1 spray into both nostrils at bedtime.     . Tiotropium Bromide Monohydrate (SPIRIVA RESPIMAT) 2.5 MCG/ACT AERS Inhale 2 puffs into the lungs daily. 1 Inhaler 0   No current facility-administered medications for this visit.      Past Medical History:  Diagnosis Date  .  AAA (abdominal aortic aneurysm) (HCC)   . Allergy    Rhinitis  . Arthritis    Rhuematoid  . Asthma   . Chronic renal insufficiency   . Diverticulosis   . Emphysema   . Falls frequently   . GERD (gastroesophageal reflux disease)   . Hearing loss of both ears   . Hiatal hernia   . Hyperlipidemia   . Hypertension   . Male hypogonadism   . Nephrolithiasis   . Nephrolithiasis   . Sleep apnea    On CPAP  . Stasis dermatitis   . Varicose veins   . Vitamin B 12 deficiency     Past Surgical History:  Procedure Laterality Date  . CARPAL TUNNEL RELEASE     bilateral  . FRACTURE SURGERY Left    Fibula  . JOINT REPLACEMENT Left     Shoulder  . JOINT REPLACEMENT Left    Hip   . KIDNEY STONE SURGERY  2003  . left hip replacement    . left knee    . left shoulder    . LITHOTRIPSY      Social History   Socioeconomic History  . Marital status: Divorced    Spouse name: Not on file  . Number of children: 2  . Years of education: Not on file  . Highest education level: Not on file  Occupational History  . Not on file  Social Needs  . Financial resource strain: Not on file  . Food insecurity:    Worry: Not on file    Inability: Not on file  . Transportation needs:    Medical: Not on file    Non-medical: Not on file  Tobacco Use  . Smoking status: Former Smoker    Packs/day: 0.10    Years: 53.00    Pack years: 5.30    Types: Cigarettes    Last attempt to quit: 08/11/2017    Years since quitting: 0.6  . Smokeless tobacco: Never Used  . Tobacco comment: 1-2 cigarettes a day   Substance and Sexual Activity  . Alcohol use: Not Currently    Alcohol/week: 0.0 standard drinks  . Drug use: No  . Sexual activity: Not on file  Lifestyle  . Physical activity:    Days per week: Not on file    Minutes per session: Not on file  . Stress: Not on file  Relationships  . Social connections:    Talks on phone: Not on file    Gets together: Not on file    Attends religious service: Not on file    Active member of club or organization: Not on file    Attends meetings of clubs or organizations: Not on file    Relationship status: Not on file  . Intimate partner violence:    Fear of current or ex partner: Not on file    Emotionally abused: Not on file    Physically abused: Not on file    Forced sexual activity: Not on file  Other Topics Concern  . Not on file  Social History Narrative  . Not on file    Family History  Problem Relation Age of Onset  . Emphysema Father   . Asthma Father   . Osteoarthritis Father   . Heart attack Mother   . Uterine cancer Mother   . Heart disease Mother   . ADD / ADHD Son     . Depression Son   . Hypertension Brother   . Heart disease  Brother        Pacemaker   . Heart murmur Brother     ROS: Arthralgias but no fevers or chills, productive cough, hemoptysis, dysphasia, odynophagia, melena, hematochezia, dysuria, hematuria, rash, seizure activity, orthopnea, PND,  claudication. Remaining systems are negative.  Physical Exam: Well-developed chronically ill appearing in no acute distress.  Skin is warm and dry.  HEENT is normal.  Neck is supple.  Chest with diminished BS throughout Cardiovascular exam is regular rate and rhythm.  Abdominal exam nontender or distended. No masses palpated. Extremities show trace to 1+ edema. neuro grossly intact  A/P  1 coronary artery disease-this is based on previous CT showing coronary calcification.  Previous nuclear study showed no ischemia.  No chest pain.  Continue medical therapy with statin.  He is not on aspirin given need for apixaban.  2 abdominal aortic aneurysm-we will plan follow-up ultrasound December 2020.  3 hypertension-blood pressure is controlled.  Continue present medications and follow.  4 hyperlipidemia-continue statin.  5 pulmonary embolus-plan to continue present dose of apixaban.  6 right side CHF-this is felt secondary to pulmonary hypertension/COPD.  Continue present dose of Lasix.  Check potassium and renal function.  7 mild aortic stenosis-he will need follow-up echoes in the future.  8 Tobacco abuse-patient counseled on discontinuing.  Olga MillersBrian Pedrohenrique Mcconville, MD

## 2018-04-12 ENCOUNTER — Encounter: Payer: Self-pay | Admitting: Cardiology

## 2018-04-12 ENCOUNTER — Ambulatory Visit (INDEPENDENT_AMBULATORY_CARE_PROVIDER_SITE_OTHER): Payer: Medicare Other | Admitting: Cardiology

## 2018-04-12 VITALS — BP 96/62 | HR 70 | Ht 67.0 in | Wt 202.0 lb

## 2018-04-12 DIAGNOSIS — I10 Essential (primary) hypertension: Secondary | ICD-10-CM

## 2018-04-12 DIAGNOSIS — I5033 Acute on chronic diastolic (congestive) heart failure: Secondary | ICD-10-CM

## 2018-04-12 DIAGNOSIS — I714 Abdominal aortic aneurysm, without rupture, unspecified: Secondary | ICD-10-CM

## 2018-04-12 DIAGNOSIS — I2510 Atherosclerotic heart disease of native coronary artery without angina pectoris: Secondary | ICD-10-CM | POA: Diagnosis not present

## 2018-04-12 DIAGNOSIS — E7800 Pure hypercholesterolemia, unspecified: Secondary | ICD-10-CM

## 2018-04-12 NOTE — Patient Instructions (Signed)
Medication Instructions:  NO CHANGE If you need a refill on your cardiac medications before your next appointment, please call your pharmacy.   Lab work: Your physician recommends that you HAVE LAB WORK TODAY If you have labs (blood work) drawn today and your tests are completely normal, you will receive your results only by: . MyChart Message (if you have MyChart) OR . A paper copy in the mail If you have any lab test that is abnormal or we need to change your treatment, we will call you to review the results.  Follow-Up: At [facility], you and your health needs are our priority.  As part of our continuing mission to provide you with exceptional heart care, we have created designated Provider Care Teams.  These Care Teams include your primary Cardiologist (physician) and Advanced Practice Providers (APPs -  Physician Assistants and Nurse Practitioners) who all work together to provide you with the care you need, when you need it. You will need a follow up appointment in 6 months.  Please call our office 2 months in advance to schedule this appointment.  You may see Brian Crenshaw, MD or one of the following Advanced Practice Providers on your designated Care Team:   Luke Kilroy, PA-C Krista Kroeger, PA-C . Callie Goodrich, PA-C     

## 2018-04-13 ENCOUNTER — Encounter: Payer: Self-pay | Admitting: *Deleted

## 2018-04-13 LAB — CBC
Hematocrit: 30.2 % — ABNORMAL LOW (ref 37.5–51.0)
Hemoglobin: 9.8 g/dL — ABNORMAL LOW (ref 13.0–17.7)
MCH: 30.1 pg (ref 26.6–33.0)
MCHC: 32.5 g/dL (ref 31.5–35.7)
MCV: 93 fL (ref 79–97)
Platelets: 225 10*3/uL (ref 150–450)
RBC: 3.26 x10E6/uL — ABNORMAL LOW (ref 4.14–5.80)
RDW: 13.3 % (ref 11.6–15.4)
WBC: 10.7 10*3/uL (ref 3.4–10.8)

## 2018-04-13 LAB — BASIC METABOLIC PANEL
BUN/Creatinine Ratio: 16 (ref 10–24)
BUN: 23 mg/dL (ref 8–27)
CO2: 25 mmol/L (ref 20–29)
Calcium: 8.7 mg/dL (ref 8.6–10.2)
Chloride: 100 mmol/L (ref 96–106)
Creatinine, Ser: 1.42 mg/dL — ABNORMAL HIGH (ref 0.76–1.27)
GFR calc Af Amer: 56 mL/min/{1.73_m2} — ABNORMAL LOW (ref >59)
GFR calc non Af Amer: 49 mL/min/{1.73_m2} — ABNORMAL LOW (ref >59)
Glucose: 89 mg/dL (ref 65–99)
Potassium: 4.3 mmol/L (ref 3.5–5.2)
Sodium: 140 mmol/L (ref 134–144)

## 2018-10-04 ENCOUNTER — Telehealth: Payer: Self-pay | Admitting: Cardiology

## 2018-10-04 NOTE — Telephone Encounter (Signed)
Please comment on eliquis. 

## 2018-10-04 NOTE — Telephone Encounter (Addendum)
   Primary Cardiologist: [name], MD  Chart reviewed as part of pre-operative protocol coverage. Patient was contacted 10/04/2018 in reference to pre-operative risk assessment for pending surgery as outlined below.  Chad Duke was last seen on 04/12/18 by Dr. Stanford Breed.  Since that day, Chad Duke has done well. He has CAD as visualized on CT scan. He had a nuclear stress test in 2019 that was negative for ischemia. He is still residing in SNF, but denies anginal symptoms. He is doing very well from a cardiac standpoint.  Per Dr. Stanford Breed:  Hold apixaban 2 days prior to procedure and resume day after  Therefore, based on ACC/AHA guidelines, the patient would be at acceptable risk for the planned procedure without further cardiovascular testing.   I will route this recommendation to the requesting party via Epic fax function and remove from pre-op pool.  Please call with questions.  Tami Lin Aiyannah Fayad, PA 10/04/2018, 4:11 PM

## 2018-10-04 NOTE — Telephone Encounter (Signed)
Hold apixaban 2 days prior to procedure and resume day after [place]  

## 2018-10-04 NOTE — Telephone Encounter (Signed)
Patient takes Eliquis 5 mg BID for history of PE and bilateral DVTs noted in June 2019 (pt continued on 5 mg dosing per Dr. Stanford Breed) for anticoagulation.    Procedure: Left Ureteroscopy  Date of procedure: 11/02/18  CrCl 50 mL/min (labs drawn 04/12/2018) Platelet count 225 (labs drawn 04/12/2018)  Request is to hold Eliquis for 2 days prior to procedure, however, given PE and bilateral DVT in 2019 will defer to MD for input.

## 2018-10-04 NOTE — Telephone Encounter (Signed)
New Message      [facility] Pre-operative Risk Assessment    Request for surgical clearance:  1. What type of surgery is being performed? Left Ureteroscopy   2. When is this surgery scheduled? 11/02/18  3. What type of clearance is required (medical clearance vs. Pharmacy clearance to hold med vs. Both)? Both  4. Are there any medications that need to be held prior to surgery and how long? Eliquis, 2 day prior  5. Practice name and name of physician performing surgery? [facility], Dr. Link Snuffer   6. What is your office phone number [phone]   7.   What is your office fax number [phone]  8.   Anesthesia type (None, local, MAC, general) ? General   Chad Duke 10/04/2018, 1:45 PM  _________________________________________________________________   (provider comments below)

## 2018-10-24 ENCOUNTER — Other Ambulatory Visit: Payer: Self-pay

## 2018-10-24 ENCOUNTER — Ambulatory Visit (INDEPENDENT_AMBULATORY_CARE_PROVIDER_SITE_OTHER): Payer: Medicare Other | Admitting: Cardiology

## 2018-10-24 ENCOUNTER — Encounter: Payer: Self-pay | Admitting: Cardiology

## 2018-10-24 VITALS — BP 90/52 | HR 102 | Temp 96.8°F | Ht 67.0 in | Wt 204.6 lb

## 2018-10-24 DIAGNOSIS — I2510 Atherosclerotic heart disease of native coronary artery without angina pectoris: Secondary | ICD-10-CM

## 2018-10-24 DIAGNOSIS — I714 Abdominal aortic aneurysm, without rupture, unspecified: Secondary | ICD-10-CM

## 2018-10-24 DIAGNOSIS — I10 Essential (primary) hypertension: Secondary | ICD-10-CM | POA: Diagnosis not present

## 2018-10-24 DIAGNOSIS — E7800 Pure hypercholesterolemia, unspecified: Secondary | ICD-10-CM | POA: Diagnosis not present

## 2018-10-24 NOTE — Progress Notes (Signed)
Spoke with [name], LPN taking care of Chad Duke today at [facility]. I requested that [name] to me -patient's MAR ,Last progress note, any recent lab work and face sheet with contact information.

## 2018-10-24 NOTE — Progress Notes (Addendum)
Preop instructions for: Chad Duke                        Date of Birth    -03-10-1945                       Date of Procedure: Wednesday 11/02/2018      Doctor:Dr. Link Snuffer  Time to arrive at [facility]:1000 am Report to: Admitting   Procedure:Cystoscopy, Ureteroscopy, Holmium Laser /stent placement How to Manage Your Diabetes Before and After Surgery  Why is it important to control my blood sugar before and after surgery? . Improving blood sugar levels before and after surgery helps healing and can limit problems. . A way of improving blood sugar control is eating a healthy diet by: o  Eating less sugar and carbohydrates o  Increasing activity/exercise o  Talking with your doctor about reaching your blood sugar goals . High blood sugars (greater than 180 mg/dL) can raise your risk of infections and slow your recovery, so you will need to focus on controlling your diabetes during the weeks before surgery. . Make sure that the doctor who takes care of your diabetes knows about your planned surgery including the date and location.  How do I manage my blood sugar before surgery? . Check your blood sugar at least 4 times a day, starting 2 days before surgery, to make sure that the level is not too high or low. o Check your blood sugar the morning of your surgery when you wake up and every 2 hours until you get to the Short Stay unit. . If your blood sugar is less than 70 mg/dL, you will need to treat for low blood sugar: o Do not take insulin. o Treat a low blood sugar (less than 70 mg/dL) with  cup of clear juice (cranberry or apple), 4 glucose tablets, OR glucose gel. o Recheck blood sugar in 15 minutes after treatment (to make sure it is greater than 70 mg/dL). If your blood sugar is not greater than 70 mg/dL on recheck, call [phone] for further instructions. . Report your blood sugar to the short stay nurse when you get to Short Stay.  . If you are  admitted to the hospital after surgery: o Your blood sugar will be checked by the staff and you will probably be given insulin after surgery (instead of oral diabetes medicines) to make sure you have good blood sugar levels. o The goal for blood sugar control after surgery is 80-180 mg/dL.   WHAT DO I DO ABOUT MY DIABETES MEDICATION?  Marland Kitchen Do not take oral diabetes medicines (pills) the morning of surgery.  . THE NIGHT BEFORE SURGERY, take 12    units of Insulin Detemir (Levemir)      insulin.      Use the scale below for the Insulin Apart (Novolog Flexpen) sliding scale for the morning of surgery! . If your CBG is greater than 220 mg/dL, you may take  of your sliding scale        Do not eat or drink past midnight the night before your procedure.(To include any tube feedings-must be discontinued)    Take these morning medications only with sips of water.(or give through gastrostomy or feeding tube). Pantoprazole (Protonix),  use Spiriva inhaler,  use Duoneb nebulizer as instructed,  use Pulmicort nebulizer if needed,  use Albuterol inhaler if needed and bring inhalers with you to the  hospital   And take Pantoprazole (Protonix)      Note: No Insulin or Diabetic meds should be given or taken the morning of the procedure!   Facility contact: Allena Katz, LPN                 Phone:  [phone]                 Health Care NGE:[facility]  Transportation contact phone#:  Please send day of procedure:current med list and meds last taken that day, confirm nothing by mouth status from what time, Patient Demographic info( to include DNR status, problem list, allergies)   RN contact name/phone#: Allena Katz, LPN               At [phone]              and Fax #: [phone]  Bring Insurance card and picture ID Leave all jewelry and other valuables at place where living( no metal or rings to be worn) No contact lens Women-no make-up, no lotions,perfumes,powders Men-no  colognes,lotions  Any questions day of procedure,call  SHORT STAY-[phone]   Sent from :[facility]                   Phone:[phone]                   Fax:[phone]  Sent by :  Telford Nab. Georgeanna Lea, BSN, RN

## 2018-10-24 NOTE — Progress Notes (Signed)
HPI: FU CAD, pulmonary embolus and diastolic CHF.Chest CT 2/19 showed three-vessel coronary calcification.Nuclear study June 2019 showed ejection fraction 76%and no ischemia. Echocardiogram June 2019 showed vigorous LV function, mild left ventricular hypertrophy, grade 1 diastolic dysfunction, very mild aortic stenosis and mild biatrial enlargement. Venous Dopplers June 2019 showed chronic versus age-indeterminate deep vein thrombosis in the popliteal vein on the right and acute versus age-indeterminate DVT on the left. CTA June 2019 showed left lower lobe pulmonary emboli.Lasix stopped at time of previous hospitalization because of acute on chronic renal insufficiency. Patient is a no CODE BLUE. Abdominal ultrasound December 2019 showed 3.5 cm abdominal aortic aneurysm.  Since last seen he has some dyspnea on exertion but no orthopnea or PND.  Mild pedal edema.  No chest pain or syncope.  Current Outpatient Medications  Medication Sig Dispense Refill  . albuterol (PROVENTIL HFA;VENTOLIN HFA) 108 (90 Base) MCG/ACT inhaler Inhale 2 puffs into the lungs every 4 (four) hours as needed for wheezing or shortness of breath. 3 Inhaler 1  . apixaban (ELIQUIS) 5 MG TABS tablet Take 1 tablet (5 mg total) by mouth 2 (two) times daily. 1 tablet 0  . Ascorbic Acid (VITAMIN C) 1000 MG tablet Take 1,000 mg by mouth daily.    Marland Kitchen atorvastatin (LIPITOR) 40 MG tablet Take 1 tablet (40 mg total) by mouth daily. 90 tablet 3  . budesonide (PULMICORT) 0.5 MG/2ML nebulizer solution Take 0.5 mg by nebulization 2 (two) times daily.    . Calcium-Magnesium-Vitamin D (CITRACAL CALCIUM+D) 600-40-500 MG-MG-UNIT TB24 Take 1 tablet by mouth daily.      . cetirizine (ZYRTEC) 10 MG tablet Take 10 mg by mouth daily.      . cyanocobalamin 2000 MCG tablet Take 2,500 mcg by mouth daily.     . diphenhydrAMINE (BENADRYL) 25 mg capsule Take 25 mg by mouth every 6 (six) hours as needed.    . folic acid (FOLVITE) 1 MG tablet Take  1 mg by mouth daily.    . furosemide (LASIX) 40 MG tablet Take 1.5 tablets (60 mg total) by mouth daily.    Marland Kitchen guaiFENesin (MUCINEX) 600 MG 12 hr tablet Take 1,200 mg by mouth 2 (two) times daily.    . insulin aspart (NOVOLOG FLEXPEN) 100 UNIT/ML FlexPen Use as directed 101-150=1U 151-200=2U 201-250=3U 251-300=5U 301-350=7U >350=9U    . insulin detemir (LEVEMIR) 100 UNIT/ML injection Inject 24 Units into the skin daily.    Marland Kitchen ipratropium-albuterol (DUONEB) 0.5-2.5 (3) MG/3ML SOLN Take 3 mLs by nebulization every 6 (six) hours.     . Magnesium 400 MG CAPS Take 1 capsule by mouth daily.    . Melatonin 10 MG TABS Take 10 mg by mouth at bedtime as needed.     . Multiple Vitamin (MULTIVITAMIN) tablet Take 1 tablet by mouth daily.      . OXYGEN 2 L. BIPAP with 1lpm o2     . pantoprazole (PROTONIX) 40 MG tablet Take 40 mg by mouth 2 (two) times daily.     . polyethylene glycol (MIRALAX) packet Take 17 g by mouth daily. 5 each 0  . potassium chloride SA (K-DUR,KLOR-CON) 20 MEQ tablet Take 40 mEq by mouth daily.    . sodium chloride (OCEAN) 0.65 % SOLN nasal spray Place 1 spray into both nostrils at bedtime.     . Tiotropium Bromide Monohydrate (SPIRIVA RESPIMAT) 2.5 MCG/ACT AERS Inhale 2 puffs into the lungs daily. 1 Inhaler 0   No current facility-administered medications for  this visit.      Past Medical History:  Diagnosis Date  . AAA (abdominal aortic aneurysm) ([place])   . Allergy    Rhinitis  . Arthritis    Rhuematoid  . Asthma   . Chronic renal insufficiency   . Diverticulosis   . Emphysema   . Falls frequently   . GERD (gastroesophageal reflux disease)   . Hearing loss of both ears   . Hiatal hernia   . Hyperlipidemia   . Hypertension   . Male hypogonadism   . Nephrolithiasis   . Nephrolithiasis   . Sleep apnea    On CPAP  . Stasis dermatitis   . Varicose veins   . Vitamin B 12 deficiency     Past Surgical History:  Procedure Laterality Date  . CARPAL TUNNEL RELEASE      bilateral  . FRACTURE SURGERY Left    Fibula  . JOINT REPLACEMENT Left    Shoulder  . JOINT REPLACEMENT Left    Hip   . KIDNEY STONE SURGERY  2003  . left hip replacement    . left knee    . left shoulder    . LITHOTRIPSY      Social History   Socioeconomic History  . Marital status: Divorced    Spouse name: Not on file  . Number of children: 2  . Years of education: Not on file  . Highest education level: Not on file  Occupational History  . Not on file  Social Needs  . Financial resource strain: Not on file  . Food insecurity    Worry: Not on file    Inability: Not on file  . Transportation needs    Medical: Not on file    Non-medical: Not on file  Tobacco Use  . Smoking status: Former Smoker    Packs/day: 0.10    Years: 53.00    Pack years: 5.30    Types: Cigarettes    Quit date: 08/11/2017    Years since quitting: 1.2  . Smokeless tobacco: Never Used  . Tobacco comment: 1-2 cigarettes a day   Substance and Sexual Activity  . Alcohol use: Not Currently    Alcohol/week: 0.0 standard drinks  . Drug use: No  . Sexual activity: Not on file  Lifestyle  . Physical activity    Days per week: Not on file    Minutes per session: Not on file  . Stress: Not on file  Relationships  . Social Herbalist on phone: Not on file    Gets together: Not on file    Attends religious service: Not on file    Active member of club or organization: Not on file    Attends meetings of clubs or organizations: Not on file    Relationship status: Not on file  . Intimate partner violence    Fear of current or ex partner: Not on file    Emotionally abused: Not on file    Physically abused: Not on file    Forced sexual activity: Not on file  Other Topics Concern  . Not on file  Social History Narrative  . Not on file    Family History  Problem Relation Age of Onset  . Emphysema Father   . Asthma Father   . Osteoarthritis Father   . Heart attack Mother   .  Uterine cancer Mother   . Heart disease Mother   . ADD / ADHD Son   .  Depression Son   . Hypertension Brother   . Heart disease Brother        Pacemaker   . Heart murmur Brother     ROS: no fevers or chills, productive cough, hemoptysis, dysphasia, odynophagia, melena, hematochezia, dysuria, hematuria, rash, seizure activity, orthopnea, PND, pedal edema, claudication. Remaining systems are negative.  Physical Exam: Well-developed well-nourished in no acute distress.  Skin is warm and dry.  HEENT is normal.  Neck is supple.  Chest is clear to auscultation with normal expansion.  Cardiovascular exam is regular rate and rhythm.  Abdominal exam nontender or distended. No masses palpated. Extremities show no edema. neuro grossly intact  ECG-sinus tachycardia at a rate of 102, right bundle branch block, cannot rule out septal and inferior infarct.  Personally reviewed  A/P  1 coronary artery disease-based on previous CT showing coronary calcification.  Previous functional study negative.  He is not having chest pain.  Continue medical therapy with statin.  No aspirin given need for apixaban.  2 abdominal aortic aneurysm-he will need a follow-up ultrasound December 2020.  3 hypertension-blood pressure is controlled.  Continue present medications and follow.  Check potassium and renal function.  4 hyperlipidemia-continue statin.  5 tobacco abuse-patient counseled on discontinuing.  6 right-sided CHF-felt secondary to COPD.  Continue present dose of Lasix.  7 prior pulmonary embolus-continue apixaban.  Check hemoglobin and renal function.  8 mild aortic stenosis-patient will need follow-up echoes in the future.  Olga Millers, MD

## 2018-10-24 NOTE — Patient Instructions (Addendum)
Medication Instructions:  The current medical regimen is effective;  continue present plan and medications.  If you need a refill on your cardiac medications before your next appointment, please call your pharmacy.   Lab work: BMET, LIPID, LIVER, CBC, A1C If you have labs (blood work) drawn today and your tests are completely normal, you will receive your results only by: Marland Kitchen MyChart Message (if you have MyChart) OR . A paper copy in the mail If you have any lab test that is abnormal or we need to change your treatment, we will call you to review the results.  Testing/Procedures: Your physician has requested that you have an abdominal aorta duplex (in December). During this test, an ultrasound is used to evaluate the aorta. Allow 30 minutes for this exam. Do not eat after midnight the day before and avoid carbonated beverages  Follow-Up: At [facility], you and your health needs are our priority.  As part of our continuing mission to provide you with exceptional heart care, we have created designated Provider Care Teams.  These Care Teams include your primary Cardiologist (physician) and Advanced Practice Providers (APPs -  Physician Assistants and Nurse Practitioners) who all work together to provide you with the care you need, when you need it. You will need a follow up appointment in 6 months.  Please call our office 2 months in advance to schedule this appointment.  You may see [name], MD or one of the following Advanced Practice Providers on your designated Care Team: [place], [place] . Fabian Sharp, PA-C

## 2018-10-25 ENCOUNTER — Encounter (HOSPITAL_COMMUNITY): Payer: Self-pay | Admitting: *Deleted

## 2018-10-27 ENCOUNTER — Other Ambulatory Visit: Payer: Self-pay | Admitting: Urology

## 2018-10-28 NOTE — Progress Notes (Signed)
Called [facility] to speak to Araceli Bouche and ask him about patient's diabetic medications and he is at lunch and to call back around 4 pm.

## 2018-10-28 NOTE — Progress Notes (Addendum)
Patient resides at [facility] and will be a SAME DAY workup on surgery day of 11/02/2018.  PCP -Dr. Wenda Low  LOV-02/01/2018  Cardiologist -Dr. [name]  LOV-10/24/2018 01/25/2018- office visit with Jana Half  Chest x-ray - N/A 01/04/2018- CT chest wo contrast  EKG -12/20/2017  Stress Test - 07/15/2017 ECHO - 07/20/2017-( mld aortic stenosis) Cardiac Cath - N/A  01/11/2018-VAS [place] AAA Duplex complete- Dr. Donnetta Hutching follows his AAA.  Pulmonary- LOV 06/29/2017 with Tammy Parrot,NP (Dr. Melvyn Novas)  Sleep Study -  CPAP - yes-2 L. (BIPAP) Followed by Dr. Marjorie Smolder at [place] Blood Sugar _____ times a day  Blood Thinner Instructions:Hold Apixaban 2 days prior to procedure and resume day after  -from office note in Epic and on chart by Fabian Sharp, PA   Aspirin Instructions:N/A Last Dose:  Anesthesia review:   Iver Nestle, PA given chart to review for surgery.  Patient with Hx Chronic Heart failure, DVT, HTN, CAD, Diabetes, COPD Gold III, AAA, Sleep apnea  Patient denies shortness of breath, fever, cough and chest pain at PAT appointment   Patient verbalized understanding of instructions that were given to them at the PAT appointment. Patient was also instructed that they will need to review over the PAT instructions again at home before surgery.

## 2018-10-31 NOTE — Progress Notes (Signed)
SPOKE [place] LPN ALL FAXED INSTRUCTIONS FOR 11-02-18 SURGERY RECEIVED AND UNDERSTOOD

## 2018-11-01 NOTE — Progress Notes (Signed)
Talked to [facility] at [employer]. Update that patients arrival time to our department should be 0930. Stated understanding.

## 2018-11-01 NOTE — Anesthesia Preprocedure Evaluation (Addendum)
Anesthesia Evaluation  Patient identified by MRN, date of birth, ID band Patient awake    Reviewed: Allergy & Precautions, NPO status , Patient's Chart, lab work & pertinent test results  History of Anesthesia Complications Negative for: history of anesthetic complications  Airway Mallampati: II  TM Distance: >3 FB Neck ROM: Full    Dental  (+) Missing,    Pulmonary asthma , sleep apnea (BiPAP 14/8 QHS) and Oxygen sleep apnea , COPD,  COPD inhaler, former smoker,    Pulmonary exam normal        Cardiovascular hypertension, + CAD and +CHF  Normal cardiovascular exam  EKG 10/24/18: SR, LAFB, RBBB  TTE 07/2017: mild LVH, EF 16-10%, grade 1 diastolic dysfunction, mild LAE, mild RAE    Neuro/Psych Depression negative neurological ROS  negative psych ROS   GI/Hepatic Neg liver ROS, hiatal hernia, GERD  Medicated and Controlled,  Endo/Other  diabetes, Type 2, Insulin Dependent  Renal/GU Renal InsufficiencyRenal disease (left renal stone, Cr 1.48)  negative genitourinary   Musculoskeletal  (+) Arthritis , Rheumatoid disorders,    Abdominal   Peds  Hematology  (+) anemia , Hgb 10.3   Anesthesia Other Findings Day of surgery medications reviewed with patient.  Reproductive/Obstetrics negative OB ROS                           Anesthesia Physical Anesthesia Plan  ASA: III  Anesthesia Plan: General   Post-op Pain Management:    Induction: Intravenous  PONV Risk Score and Plan: 3 and Treatment may vary due to age or medical condition, Ondansetron and Midazolam  Airway Management Planned: LMA  Additional Equipment: None  Intra-op Plan:   Post-operative Plan: Extubation in OR  Informed Consent: I have reviewed the patients History and Physical, chart, labs and discussed the procedure including the risks, benefits and alternatives for the proposed anesthesia with the patient or authorized  representative who has indicated his/her understanding and acceptance.     Dental advisory given  Plan Discussed with: CRNA  Anesthesia Plan Comments:        Anesthesia Quick Evaluation

## 2018-11-01 NOTE — Progress Notes (Signed)
Made multiple attempts to contact [facility] to request patient come in at 0930, not 1000

## 2018-11-02 ENCOUNTER — Ambulatory Visit (HOSPITAL_COMMUNITY): Payer: Medicare Other | Admitting: Anesthesiology

## 2018-11-02 ENCOUNTER — Encounter (HOSPITAL_COMMUNITY)
Admission: RE | Disposition: A | Discharge status: Home / Self Care | Payer: Self-pay | Source: Home / Self Care | Attending: Urology

## 2018-11-02 ENCOUNTER — Ambulatory Visit (HOSPITAL_COMMUNITY)
Admission: RE | Admit: 2018-11-02 | Discharge: 2018-11-02 | Disposition: A | Discharge status: Home / Self Care | Payer: Medicare Other | Attending: Urology | Admitting: Urology

## 2018-11-02 ENCOUNTER — Other Ambulatory Visit: Payer: Self-pay

## 2018-11-02 ENCOUNTER — Ambulatory Visit (HOSPITAL_COMMUNITY): Payer: Medicare Other

## 2018-11-02 ENCOUNTER — Encounter (HOSPITAL_COMMUNITY): Payer: Self-pay

## 2018-11-02 DIAGNOSIS — N200 Calculus of kidney: Secondary | ICD-10-CM | POA: Diagnosis not present

## 2018-11-02 DIAGNOSIS — J45909 Unspecified asthma, uncomplicated: Secondary | ICD-10-CM | POA: Insufficient documentation

## 2018-11-02 DIAGNOSIS — Z91040 Latex allergy status: Secondary | ICD-10-CM | POA: Diagnosis not present

## 2018-11-02 DIAGNOSIS — N183 Chronic kidney disease, stage 3 (moderate): Secondary | ICD-10-CM | POA: Diagnosis not present

## 2018-11-02 DIAGNOSIS — I4510 Unspecified right bundle-branch block: Secondary | ICD-10-CM | POA: Diagnosis not present

## 2018-11-02 DIAGNOSIS — N400 Enlarged prostate without lower urinary tract symptoms: Secondary | ICD-10-CM | POA: Diagnosis not present

## 2018-11-02 DIAGNOSIS — Z86718 Personal history of other venous thrombosis and embolism: Secondary | ICD-10-CM | POA: Insufficient documentation

## 2018-11-02 DIAGNOSIS — Z7951 Long term (current) use of inhaled steroids: Secondary | ICD-10-CM | POA: Diagnosis not present

## 2018-11-02 DIAGNOSIS — D649 Anemia, unspecified: Secondary | ICD-10-CM | POA: Insufficient documentation

## 2018-11-02 DIAGNOSIS — F17200 Nicotine dependence, unspecified, uncomplicated: Secondary | ICD-10-CM | POA: Diagnosis not present

## 2018-11-02 DIAGNOSIS — F329 Major depressive disorder, single episode, unspecified: Secondary | ICD-10-CM | POA: Diagnosis not present

## 2018-11-02 DIAGNOSIS — K219 Gastro-esophageal reflux disease without esophagitis: Secondary | ICD-10-CM | POA: Insufficient documentation

## 2018-11-02 DIAGNOSIS — I130 Hypertensive heart and chronic kidney disease with heart failure and stage 1 through stage 4 chronic kidney disease, or unspecified chronic kidney disease: Secondary | ICD-10-CM | POA: Diagnosis not present

## 2018-11-02 DIAGNOSIS — Z86711 Personal history of pulmonary embolism: Secondary | ICD-10-CM | POA: Insufficient documentation

## 2018-11-02 DIAGNOSIS — N281 Cyst of kidney, acquired: Secondary | ICD-10-CM | POA: Diagnosis not present

## 2018-11-02 DIAGNOSIS — Z9981 Dependence on supplemental oxygen: Secondary | ICD-10-CM | POA: Insufficient documentation

## 2018-11-02 DIAGNOSIS — I509 Heart failure, unspecified: Secondary | ICD-10-CM | POA: Diagnosis not present

## 2018-11-02 DIAGNOSIS — Z7901 Long term (current) use of anticoagulants: Secondary | ICD-10-CM | POA: Insufficient documentation

## 2018-11-02 DIAGNOSIS — Z79899 Other long term (current) drug therapy: Secondary | ICD-10-CM | POA: Diagnosis not present

## 2018-11-02 DIAGNOSIS — Z87442 Personal history of urinary calculi: Secondary | ICD-10-CM | POA: Insufficient documentation

## 2018-11-02 DIAGNOSIS — E1122 Type 2 diabetes mellitus with diabetic chronic kidney disease: Secondary | ICD-10-CM | POA: Insufficient documentation

## 2018-11-02 DIAGNOSIS — J449 Chronic obstructive pulmonary disease, unspecified: Secondary | ICD-10-CM | POA: Diagnosis not present

## 2018-11-02 DIAGNOSIS — M109 Gout, unspecified: Secondary | ICD-10-CM | POA: Insufficient documentation

## 2018-11-02 DIAGNOSIS — Z794 Long term (current) use of insulin: Secondary | ICD-10-CM | POA: Insufficient documentation

## 2018-11-02 DIAGNOSIS — Z20828 Contact with and (suspected) exposure to other viral communicable diseases: Secondary | ICD-10-CM | POA: Diagnosis not present

## 2018-11-02 DIAGNOSIS — M069 Rheumatoid arthritis, unspecified: Secondary | ICD-10-CM | POA: Insufficient documentation

## 2018-11-02 DIAGNOSIS — G4730 Sleep apnea, unspecified: Secondary | ICD-10-CM | POA: Diagnosis not present

## 2018-11-02 DIAGNOSIS — I2510 Atherosclerotic heart disease of native coronary artery without angina pectoris: Secondary | ICD-10-CM | POA: Insufficient documentation

## 2018-11-02 DIAGNOSIS — K449 Diaphragmatic hernia without obstruction or gangrene: Secondary | ICD-10-CM | POA: Insufficient documentation

## 2018-11-02 HISTORY — PX: CYSTOSCOPY/URETEROSCOPY/HOLMIUM LASER/STENT PLACEMENT: SHX6546

## 2018-11-02 HISTORY — DX: Depression, unspecified: F32.A

## 2018-11-02 HISTORY — DX: Heart failure, unspecified: I50.9

## 2018-11-02 LAB — BASIC METABOLIC PANEL
Anion gap: 9 (ref 5–15)
BUN: 20 mg/dL (ref 8–23)
CO2: 25 mmol/L (ref 22–32)
Calcium: 9 mg/dL (ref 8.9–10.3)
Chloride: 106 mmol/L (ref 98–111)
Creatinine, Ser: 1.48 mg/dL — ABNORMAL HIGH (ref 0.61–1.24)
GFR calc Af Amer: 54 mL/min — ABNORMAL LOW (ref >60)
GFR calc non Af Amer: 46 mL/min — ABNORMAL LOW (ref >60)
Glucose, Bld: 104 mg/dL — ABNORMAL HIGH (ref 70–99)
Potassium: 3.4 mmol/L — ABNORMAL LOW (ref 3.5–5.1)
Sodium: 140 mmol/L (ref 135–145)

## 2018-11-02 LAB — GLUCOSE, CAPILLARY: Glucose-Capillary: 88 mg/dL (ref 70–99)

## 2018-11-02 LAB — CBC
HCT: 32.9 % — ABNORMAL LOW (ref 39.0–52.0)
Hemoglobin: 10.3 g/dL — ABNORMAL LOW (ref 13.0–17.0)
MCH: 31.7 pg (ref 26.0–34.0)
MCHC: 31.3 g/dL (ref 30.0–36.0)
MCV: 101.2 fL — ABNORMAL HIGH (ref 80.0–100.0)
Platelets: 182 10*3/uL (ref 150–400)
RBC: 3.25 MIL/uL — ABNORMAL LOW (ref 4.22–5.81)
RDW: 15.3 % (ref 11.5–15.5)
WBC: 9.7 10*3/uL (ref 4.0–10.5)
nRBC: 0 % (ref 0.0–0.2)

## 2018-11-02 LAB — SARS CORONAVIRUS 2 BY RT PCR (HOSPITAL ORDER, PERFORMED IN ~~LOC~~ HOSPITAL LAB): SARS Coronavirus 2: NEGATIVE

## 2018-11-02 SURGERY — CYSTOSCOPY/URETEROSCOPY/HOLMIUM LASER/STENT PLACEMENT
Anesthesia: General | Laterality: Left

## 2018-11-02 MED ORDER — ONDANSETRON HCL 4 MG/2ML IJ SOLN
INTRAMUSCULAR | Status: DC | PRN
  Administered 2018-11-02: 4 mg via INTRAVENOUS

## 2018-11-02 MED ORDER — ONDANSETRON HCL 4 MG/2ML IJ SOLN
INTRAMUSCULAR | Status: AC
  Filled 2018-11-02: qty 2

## 2018-11-02 MED ORDER — PROMETHAZINE HCL 25 MG/ML IJ SOLN: 6.2500 mg | INTRAMUSCULAR | Status: DC | PRN

## 2018-11-02 MED ORDER — FENTANYL CITRATE (PF) 100 MCG/2ML IJ SOLN
INTRAMUSCULAR | Status: DC | PRN
  Administered 2018-11-02: 25 ug via INTRAVENOUS
  Administered 2018-11-02: 50 ug via INTRAVENOUS
  Administered 2018-11-02: 25 ug via INTRAVENOUS

## 2018-11-02 MED ORDER — OXYCODONE HCL 5 MG/5ML PO SOLN: 5.0000 mg | Freq: Once | ORAL | Status: DC | PRN

## 2018-11-02 MED ORDER — PROPOFOL 10 MG/ML IV BOLUS
INTRAVENOUS | Status: DC | PRN
  Administered 2018-11-02: 130 mg via INTRAVENOUS
  Administered 2018-11-02: 20 mg via INTRAVENOUS

## 2018-11-02 MED ORDER — LIDOCAINE HCL (CARDIAC) PF 100 MG/5ML IV SOSY
PREFILLED_SYRINGE | INTRAVENOUS | Status: DC | PRN
  Administered 2018-11-02: 60 mg via INTRAVENOUS

## 2018-11-02 MED ORDER — PHENYLEPHRINE 40 MCG/ML (10ML) SYRINGE FOR IV PUSH (FOR BLOOD PRESSURE SUPPORT)
PREFILLED_SYRINGE | INTRAVENOUS | Status: AC
  Filled 2018-11-02: qty 10

## 2018-11-02 MED ORDER — PROPOFOL 10 MG/ML IV BOLUS
INTRAVENOUS | Status: AC
  Filled 2018-11-02: qty 20

## 2018-11-02 MED ORDER — FENTANYL CITRATE (PF) 100 MCG/2ML IJ SOLN
INTRAMUSCULAR | Status: AC
  Filled 2018-11-02: qty 2

## 2018-11-02 MED ORDER — SODIUM CHLORIDE 0.9 % IR SOLN
Status: DC | PRN
  Administered 2018-11-02: 3000 mL via INTRAVESICAL

## 2018-11-02 MED ORDER — CEFAZOLIN SODIUM-DEXTROSE 2-4 GM/100ML-% IV SOLN
2.0000 g | INTRAVENOUS | Status: AC
  Administered 2018-11-02: 2 g via INTRAVENOUS
  Filled 2018-11-02: qty 100

## 2018-11-02 MED ORDER — PHENYLEPHRINE HCL (PRESSORS) 10 MG/ML IV SOLN
INTRAVENOUS | Status: DC | PRN
  Administered 2018-11-02 (×2): 80 ug via INTRAVENOUS

## 2018-11-02 MED ORDER — OXYCODONE HCL 5 MG PO TABS: 5.0000 mg | ORAL_TABLET | Freq: Once | ORAL | Status: DC | PRN

## 2018-11-02 MED ORDER — IOHEXOL 300 MG/ML  SOLN
INTRAMUSCULAR | Status: DC | PRN
  Administered 2018-11-02: 10 mL

## 2018-11-02 MED ORDER — LACTATED RINGERS IV SOLN
INTRAVENOUS | Status: DC
  Administered 2018-11-02 (×2): via INTRAVENOUS

## 2018-11-02 MED ORDER — ACETAMINOPHEN 500 MG PO TABS
1000.0000 mg | ORAL_TABLET | Freq: Once | ORAL | Status: AC
  Administered 2018-11-02: 1000 mg via ORAL
  Filled 2018-11-02: qty 2

## 2018-11-02 MED ORDER — FENTANYL CITRATE (PF) 100 MCG/2ML IJ SOLN: 25.0000 ug | INTRAMUSCULAR | Status: DC | PRN

## 2018-11-02 SURGICAL SUPPLY — 22 items
BAG URO CATCHER STRL LF (MISCELLANEOUS) ×3 IMPLANT
BASKET LASER NITINOL 1.9FR (BASKET) IMPLANT
BASKET ZERO TIP NITINOL 2.4FR (BASKET) ×2 IMPLANT
BSKT STON RTRVL 120 1.9FR (BASKET)
BSKT STON RTRVL ZERO TP 2.4FR (BASKET) ×1
CATH INTERMIT  6FR 70CM (CATHETERS) ×2 IMPLANT
CLOTH BEACON ORANGE TIMEOUT ST (SAFETY) ×3 IMPLANT
COVER WAND RF STERILE (DRAPES) IMPLANT
EXTRACTOR STONE 1.7FRX115CM (UROLOGICAL SUPPLIES) IMPLANT
FIBER LASER FLEXIVA 365 (UROLOGICAL SUPPLIES) IMPLANT
FIBER LASER TRAC TIP (UROLOGICAL SUPPLIES) ×2 IMPLANT
GLOVE BIO SURGEON STRL SZ7.5 (GLOVE) ×3 IMPLANT
GOWN STRL REUS W/TWL XL LVL3 (GOWN DISPOSABLE) ×3 IMPLANT
GUIDEWIRE ANG ZIPWIRE 038X150 (WIRE) IMPLANT
GUIDEWIRE STR DUAL SENSOR (WIRE) ×5 IMPLANT
KIT TURNOVER KIT A (KITS) IMPLANT
MANIFOLD NEPTUNE II (INSTRUMENTS) ×3 IMPLANT
PACK CYSTO (CUSTOM PROCEDURE TRAY) ×3 IMPLANT
SHEATH URETERAL 12FRX28CM (UROLOGICAL SUPPLIES) IMPLANT
SHEATH URETERAL 12FRX35CM (MISCELLANEOUS) ×2 IMPLANT
STENT URET 6FRX26 CONTOUR (STENTS) ×2 IMPLANT
TUBING UROLOGY SET (TUBING) ×3 IMPLANT

## 2018-11-02 NOTE — Anesthesia Procedure Notes (Signed)
Procedure Name: LMA Insertion Date/Time: 11/02/2018 12:49 PM Performed by: Glory Buff, CRNA Pre-anesthesia Checklist: Patient identified, Emergency Drugs available, Suction available and Patient being monitored Patient Re-evaluated:Patient Re-evaluated prior to induction Oxygen Delivery Method: Circle system utilized Preoxygenation: Pre-oxygenation with 100% oxygen Induction Type: IV induction LMA: LMA inserted LMA Size: 4.0 Number of attempts: 1 Placement Confirmation: positive ETCO2 Dental Injury: Injury to lip

## 2018-11-02 NOTE — Anesthesia Postprocedure Evaluation (Signed)
Anesthesia Post Note  Patient: Chad Duke  Procedure(s) Performed: CYSTOSCOPY/URETEROSCOPY/HOLMIUM LASER/STENT PLACEMENT (Left )     Patient location during evaluation: PACU Anesthesia Type: General Level of consciousness: awake and alert Pain management: pain level controlled Vital Signs Assessment: post-procedure vital signs reviewed and stable Respiratory status: spontaneous breathing, nonlabored ventilation and respiratory function stable Cardiovascular status: blood pressure returned to baseline and stable Postop Assessment: no apparent nausea or vomiting Anesthetic complications: no    Last Vitals:  Vitals:   11/02/18 1430 11/02/18 1445  BP: 127/71 (!) 118/91  Pulse: 82 81  Resp: 13 16  Temp: 36.4 C (!) 36.4 C  SpO2: 90% 91%    Last Pain:  Vitals:   11/02/18 1445  TempSrc:   PainSc: 0-No pain                 Audry Pili

## 2018-11-02 NOTE — H&P (Signed)
CC/HPI: Cc: Renal masses, kidney stone  HPI:  02/14/2018  73 year old male underwent a renal ultrasound for potential urinary retention. Postvoid residual was around 100 cc. The left kidney demonstrated to areas of shadowing consistent with possible stones up to 1.4 cm in size. He also had a potential right renal mass measuring 3 cm versus normal dromedary hump. He denies any hematuria or dysuria. Urinalysis is negative today. He did have a chest CT scan performed recently that demonstrated some left renal calculi that were nonobstructing. There was no obvious renal mass. The entire kidney was not visualized. Only about half the kidney bilaterally was visualized.  Urinary complaint is urinary frequency every 30 minutes. He also has some intermittency. He does not wish to start any medication.   He has multiple medical comorbidities including history of DVT/PE on elliquis. He is on supplemental oxygen.   03/23/2018  In the interval, the patient underwent a CT IVP. This revealed nonobstructing bilateral renal calculi left greater than right. Largest stone on the left was about 8 mm in the left lower pole. There is no hydronephrosis. No ureteral calculi. He had a 2 cm Bosniak one renal cyst on the right. No obvious concerning mass. He also had findings that were questionable for diverticulitis. However, he has no fever or abdominal pain. Normal bowel movements.   8 12 2020  Patient has not passed any stones. He continues to have microscopic hematuria. He is agreeable to cystoscopy today to rule out lower urinary tract lesion. KUB today showed stable renal calculi. He would like to observe these.     ALLERGIES: Latex    MEDICATIONS: Allopurinol 100 mg tablet  Zyrtec 10 mg capsule  Albuterol Sulfate  Almacone  Atorvastatin Calcium 40 mg tablet  Citracal + D  Cyanocobalamin  Diphenhist  Eliquis 5 mg (74 tabs) tablet, dose pack  Folic Acid  Furosemide  Levemir  Miralax  Mucinex  Multivitamin   Novolog  Pantoprazole Sodium 40 mg tablet, delayed release  Potassium Chloride  Pulmicort  Vitamin C     GU PSH: Locm 300-399Mg /Ml Iodine,1Ml - 03/02/2018     NON-GU PSH: Carpal tunnel surgery Hip Replacement, Left Knee Arthroscopy/surgery, Left Shoulder Arthroscopy/surgery, Left     GU PMH: Renal cyst - 03/23/2018 Renal calculus - 02/14/2018 Right uncertain neoplasm of kidney - 02/14/2018 Chronic kidney disease stage 3 (GFR 30-60)      PMH Notes:  1898-02-09 00:00:00 - Note: Normal Routine History And Physical Adult   NON-GU PMH: Arthritis Asthma Diabetes Type 2 GERD Gout Sleep Apnea    FAMILY HISTORY: 2 sons - Other   SOCIAL HISTORY: Marital Status: Divorced Preferred Language: English; Race: White Current Smoking Status: Patient smokes.   Tobacco Use Assessment Completed: Used Tobacco in last 30 days? Drinks 2 caffeinated drinks per day.    REVIEW OF SYSTEMS:    GU Review Male:   Patient denies frequent urination, hard to postpone urination, burning/ pain with urination, get up at night to urinate, leakage of urine, stream starts and stops, trouble starting your stream, have to strain to urinate , erection problems, and penile pain.  Gastrointestinal (Upper):   Patient denies nausea, vomiting, and indigestion/ heartburn.  Gastrointestinal (Lower):   Patient denies diarrhea and constipation.  Constitutional:   Patient denies fever, night sweats, weight loss, and fatigue.  Skin:   Patient denies skin rash/ lesion and itching.  Eyes:   Patient denies blurred vision and double vision.  Ears/ Nose/ Throat:   Patient  denies sore throat and sinus problems.  Hematologic/Lymphatic:   Patient denies swollen glands and easy bruising.  Cardiovascular:   Patient denies leg swelling and chest pains.  Respiratory:   Patient denies cough and shortness of breath.  Endocrine:   Patient denies excessive thirst.  Musculoskeletal:   Patient denies back pain and joint pain.   Neurological:   Patient denies headaches and dizziness.  Psychologic:   Patient denies depression and anxiety.   VITAL SIGNS:      09/21/2018 02:48 PM  BP 119/78 mmHg  Heart Rate 106 /min  Temperature 98.0 F / 36.6 C   PAST DATA REVIEWED:  Source Of History:  Patient   PROCEDURES:         Flexible Cystoscopy - 52000  Risks, benefits, and some of the potential complications of the procedure were discussed at length with the patient including infection, bleeding, voiding discomfort, urinary retention, fever, chills, sepsis, and others. All questions were answered. Informed consent was obtained. Antibiotic prophylaxis was given. Sterile technique and intraurethral analgesia were used.  Meatus:  Normal size. Normal location. Normal condition.  Urethra:  No strictures.  External Sphincter:  Normal.  Verumontanum:  Normal.  Prostate:  Borderline obstructing. Mild hyperplasia.  Bladder Neck:  Non-obstructing.  Ureteral Orifices:  Normal location. Normal size. Normal shape.   Bladder:  No trabeculation. No tumors. Normal mucosa. No stones.      The lower urinary tract was carefully examined. The procedure was well-tolerated and without complications. Antibiotic instructions were given. Instructions were given to call the office immediately for bloody urine, difficulty urinating, urinary retention, painful or frequent urination, fever, chills, nausea, vomiting or other illness. The patient stated that he understood these instructions and would comply with them.          KUB - [id number]  A single view of the abdomen is obtained.  Stable left-sided renal calculi. Small right renal calculus      Patient confirmed No Neulasta OnPro Device.            Urinalysis w/Scope Dipstick Dipstick Cont'd Micro  Color: Yellow Bilirubin: Neg mg/dL WBC/hpf: NS (Not Seen)  Appearance: Clear Ketones: Neg mg/dL RBC/hpf: 3 - 10/hpf  Specific Gravity: 1.015 Blood: 1+ ery/uL Bacteria: NS (Not Seen)  pH:  6.0 Protein: Neg mg/dL Cystals: NS (Not Seen)  Glucose: Neg mg/dL Urobilinogen: 0.2 mg/dL Casts: NS (Not Seen)    Nitrites: Neg Trichomonas: Not Present    Leukocyte Esterase: Neg leu/uL Mucous: Not Present      Epithelial Cells: NS (Not Seen)      Yeast: NS (Not Seen)      Sperm: Not Present    ASSESSMENT:      ICD-10 Details  1 GU:   Renal calculus - N20.0   2   Renal cyst - N28.1   3   Microscopic hematuria - R31.21      PLAN:           Orders         Schedule X-Rays: 6 Months - KUB  Return Visit/Planned Activity: 6 Months - Follow up MD, KUB          Document Letter(s):  Created for Patient: Clinical Summary         Notes:   Cystoscopy negative   He would like To watch renal calculi. Follow-up 6 months with a KUB  .  If he decides for ureteroscopy, he will give [place] a call.   Signed by Cornelia Copa  Octavia HeirBell, III, M.D. on 09/21/18 at 3:33 PM (EDT

## 2018-11-02 NOTE — Discharge Instructions (Signed)

## 2018-11-02 NOTE — Transfer of Care (Signed)
Immediate Anesthesia Transfer of Care Note  Patient: Chad Duke  Procedure(s) Performed: CYSTOSCOPY/URETEROSCOPY/HOLMIUM LASER/STENT PLACEMENT (Left )  Patient Location: PACU  Anesthesia Type:General  Level of Consciousness: drowsy, patient cooperative and responds to stimulation  Airway & Oxygen Therapy: Patient Spontanous Breathing and Patient connected to face mask oxygen  Post-op Assessment: Report given to RN and Post -op Vital signs reviewed and stable  Post vital signs: Reviewed and stable  Last Vitals:  Vitals Value Taken Time  BP 115/75 11/02/18 1401  Temp 36.9 C 11/02/18 1401  Pulse 86 11/02/18 1402  Resp 21 11/02/18 1402  SpO2 100 % 11/02/18 1402  Vitals shown include unvalidated device data.  Last Pain:  Vitals:   11/02/18 0940  TempSrc:   PainSc: 0-No pain      Patients Stated Pain Goal: 3 (79/89/21 1941)  Complications: No apparent anesthesia complications

## 2018-11-02 NOTE — Op Note (Signed)
Operative Note  Preoperative diagnosis:  1. Left  Renal calculi  Postoperative diagnosis: 1.  Left renal calculi  Procedure(s): 1.  Cystoscopy with left retrograde pyelogram, left ureteroscopy with laser lithotripsy and stone basketing, ureteral stent placement  Surgeon: Link Snuffer, MD  Assistants: none  Anesthesia:  general  Complications:  None immediate  EBL:  minimal  Specimens: 1.  none  Drains/Catheters: 1.  6 x 26 double-J ureteral stent  Intraoperative findings:  1.  Normal anterior urethra 2.  Mildly obstructing prostate 3.  Normal bladder mucosa without any tumors.  4.  Left retrograde pyelogram revealed a well opacified kidney with no filling defects.  No hydronephrosis.  5.   Ureteroscopy revealed multiple stones within the kidney that were fragmented to tiny fragments.  Some of these were basket extracted.  Indication:   73 year old male with left renal calculi elected to undergo the above operation.  Description of procedure:  The patient was identified and consent was obtained.  The patient was taken to the operating room and placed in the supine position.  The patient was placed under   General anesthesia.  Perioperative antibiotics were administered.  The patient was placed in dorsal lithotomy.  Patient was prepped and draped in a standard sterile fashion and a timeout was performed.   a 13 French rigid cystoscope was advanced into the urethra and into the bladder.  Complete cystoscopy was performed with the findings noted above.  A Sensor wire was advanced up the left ureter and into the kidney under fluoroscopic guidance.  Another Sensor wire was advanced alongside it.  The scope was withdrawn.  One of the wires was secured to the drape as a safety wire.  The other wire was used to advance a  12 x 14 ureteral access sheath over the wire under continuous fluoroscopic guidance.  The wire was withdrawn.  A retrograde pyelogram was performed through the sheath.  The  inner sheath was then withdrawn.  Flexible ureteroscopy was performed and multiple stone fragments were fragmented to smaller fragments.  Some of these were basket extracted.  Once only tiny stone fragments remained, I withdrew the scope along with the access sheath visualizing the entire ureter upon removal.  No significant ureteral trauma or ureteral calculi were seen.  I back loaded the wire onto a rigid cystoscope and advanced that into the bladder followed by routine placement of a 6 x 26 double-J ureteral stent.  Fluoroscopy confirmed proximal placement and direct visualization confirmed a good coil within the bladder.  I drained the bladder and withdrew the scope.  The patient tolerated the procedure well was stable postoperatively.  Plan:  Return in 1 week for stent removal

## 2018-11-03 ENCOUNTER — Encounter (HOSPITAL_COMMUNITY): Payer: Self-pay | Admitting: Urology

## 2018-11-03 LAB — HEMOGLOBIN A1C
Hgb A1c MFr Bld: 6 % — ABNORMAL HIGH (ref 4.8–5.6)
Mean Plasma Glucose: 126 mg/dL

## 2018-11-24 ENCOUNTER — Other Ambulatory Visit: Payer: Self-pay | Admitting: *Deleted

## 2018-11-24 DIAGNOSIS — Z122 Encounter for screening for malignant neoplasm of respiratory organs: Secondary | ICD-10-CM

## 2018-11-24 DIAGNOSIS — F17210 Nicotine dependence, cigarettes, uncomplicated: Secondary | ICD-10-CM

## 2018-11-24 DIAGNOSIS — Z87891 Personal history of nicotine dependence: Secondary | ICD-10-CM

## 2018-12-22 ENCOUNTER — Inpatient Hospital Stay (HOSPITAL_COMMUNITY): Admission: RE | Admit: 2018-12-22 | Payer: Medicare Other | Source: Ambulatory Visit

## 2019-04-26 ENCOUNTER — Ambulatory Visit (HOSPITAL_COMMUNITY)
Admission: RE | Admit: 2019-04-26 | Discharge: 2019-04-26 | Disposition: A | Discharge status: Home / Self Care | Payer: Medicare Other | Source: Ambulatory Visit | Attending: Cardiovascular Disease | Admitting: Cardiovascular Disease

## 2019-04-26 ENCOUNTER — Other Ambulatory Visit: Payer: Self-pay

## 2019-04-26 DIAGNOSIS — I714 Abdominal aortic aneurysm, without rupture, unspecified: Secondary | ICD-10-CM

## 2019-04-27 ENCOUNTER — Other Ambulatory Visit: Payer: Self-pay | Admitting: *Deleted

## 2019-04-27 DIAGNOSIS — I723 Aneurysm of iliac artery: Secondary | ICD-10-CM

## 2019-05-29 ENCOUNTER — Ambulatory Visit: Payer: Medicare Other

## 2019-05-29 ENCOUNTER — Other Ambulatory Visit: Payer: Self-pay

## 2019-05-29 ENCOUNTER — Ambulatory Visit
Admission: RE | Admit: 2019-05-29 | Discharge: 2019-05-29 | Disposition: A | Discharge status: Home / Self Care | Payer: Medicare Other | Source: Ambulatory Visit | Attending: Acute Care | Admitting: Acute Care

## 2019-05-29 DIAGNOSIS — Z87891 Personal history of nicotine dependence: Secondary | ICD-10-CM

## 2019-05-29 DIAGNOSIS — Z122 Encounter for screening for malignant neoplasm of respiratory organs: Secondary | ICD-10-CM

## 2019-05-29 DIAGNOSIS — F17210 Nicotine dependence, cigarettes, uncomplicated: Secondary | ICD-10-CM

## 2019-05-30 ENCOUNTER — Other Ambulatory Visit: Payer: Self-pay | Admitting: *Deleted

## 2019-05-30 DIAGNOSIS — Z87891 Personal history of nicotine dependence: Secondary | ICD-10-CM

## 2019-05-30 DIAGNOSIS — F17210 Nicotine dependence, cigarettes, uncomplicated: Secondary | ICD-10-CM

## 2019-05-30 NOTE — Progress Notes (Signed)
I have called patient and let them  know their  low dose Ct was read as a Lung  RADS 3, nodules that are probably benign findings, short term follow up suggested: includes nodules with a low likelihood of becoming a clinically active cancer. Radiology recommends a 6 month repeat LDCT follow up. I let them  know we will order and schedule their  6 month follow up  screening scan for 11/2019. I  let them  know there was notation of CAD on their  scan.  I reminded the patient  that this is a non-gated exam therefore degree or severity of disease  cannot be determined. I have asked them to follow up with their PCP regarding potential risk factor modification, dietary therapy or pharmacologic therapy if clinically indicated. Pt.  is  currently on statin therapy.   Denise Please place order for annual  screening scan for  11/2019 and fax results to PCP. Thanks so much.  Pt requests [address]  [facility] location , a Wednesday after 2 pm. I told him he would get a call to schedule closer to the time. He verbalized understanding of the above.  Thanks so much

## 2019-05-30 NOTE — Progress Notes (Signed)
c 

## 2019-06-06 NOTE — Progress Notes (Deleted)
Cardiology Office Note   Date:  06/06/2019   ID:  Chad Duke, DOB [birth year], MRN [id number]  PCP:  Georgann Housekeeper, MD  Cardiologist: Dr. Jens Som No chief complaint on file.    History of Present Illness: Chad Duke is a 74 y.o. male who presents for ongoing assessment and management of CAD, history of PE, diastolic CHF.  Most recent nuclear study in June 2019 showed an ejection fraction of 76% with no ischemia.  Echocardiogram at the same time showed vigorous LV function mild left ventricular hypertrophy with grade 1 diastolic dysfunction, very mild aortic stenosis with mild biatrial enlargement.  A CTA in June 2019 showed left lower lobe pulmonary emboli.  Lasix was stopped at that time because of acute on chronic renal insufficiency.  Abdominal ultrasound in December 2019 revealed a 3.5 cm abdominal aortic aneurysm.  The patient has been made a DNR.  He as last seen by Dr. Jens Som on 10/24/2018.  No changes were made in her medication regimen, there were no new testing planned.     Past Medical History:  Diagnosis Date  . AAA (abdominal aortic aneurysm) (HCC)   . Allergy    Rhinitis  . Arthritis    Rhuematoid  . Asthma   . CHF (congestive heart failure) (HCC)    chronic diastolic (congestive) heart failure from notes from [facility] face sheet  . Chronic renal insufficiency   . Depression    per notes from [facility] on chart  . Diverticulosis   . Emphysema   . Falls frequently   . GERD (gastroesophageal reflux disease)   . Hearing loss of both ears   . Hiatal hernia   . Hyperlipidemia   . Hypertension   . Male hypogonadism   . Nephrolithiasis   . Nephrolithiasis   . Sleep apnea    On CPAP  . Stasis dermatitis   . Varicose veins   . Vitamin B 12 deficiency     Past Surgical History:  Procedure Laterality Date  . CARPAL TUNNEL RELEASE     bilateral  . CYSTOSCOPY/URETEROSCOPY/HOLMIUM LASER/STENT PLACEMENT Left 11/02/2018   Procedure:  CYSTOSCOPY/URETEROSCOPY/HOLMIUM LASER/STENT PLACEMENT;  Surgeon: Crista Elliot, MD;  Location: [facility];  Service: Urology;  Laterality: Left;  . FRACTURE SURGERY Left    Fibula  . JOINT REPLACEMENT Left    Shoulder  . JOINT REPLACEMENT Left    Hip   . KIDNEY STONE SURGERY  2003  . left hip replacement    . left knee    . left shoulder    . LITHOTRIPSY       Current Outpatient Medications  Medication Sig Dispense Refill  . albuterol (PROVENTIL HFA;VENTOLIN HFA) 108 (90 Base) MCG/ACT inhaler Inhale 2 puffs into the lungs every 4 (four) hours as needed for wheezing or shortness of breath. 3 Inhaler 1  . allopurinol (ZYLOPRIM) 100 MG tablet Take 100 mg by mouth daily.    Marland Kitchen alum & mag hydroxide-simeth (MAALOX/MYLANTA) 200-200-20 MG/5ML suspension Take 30 mLs by mouth every 4 (four) hours as needed for indigestion or heartburn.    Marland Kitchen apixaban (ELIQUIS) 5 MG TABS tablet Take 1 tablet (5 mg total) by mouth 2 (two) times daily. (Patient not taking: Reported on 10/31/2018) 1 tablet 0  . atorvastatin (LIPITOR) 40 MG tablet Take 1 tablet (40 mg total) by mouth daily. (Patient taking differently: Take 40 mg by mouth at bedtime. ) 90 tablet 3  . bisacodyl (DULCOLAX) 10 MG suppository Place 10 mg rectally daily as  needed for moderate constipation.    . budesonide (PULMICORT) 0.25 MG/2ML nebulizer solution Take 0.25 mg by nebulization 2 (two) times daily.    Marland Kitchen CALCIUM-MAGNESIUM-VITAMIN D PO Take 1 tablet by mouth daily.    . camphor-menthol (DERMASARRA ANTI-ITCH) lotion Apply 1 application topically daily as needed for itching. Apply to bilateral legs    . cetirizine (ZYRTEC) 10 MG tablet Take 10 mg by mouth daily.      . diphenhydrAMINE (BENADRYL) 25 mg capsule Take 25 mg by mouth every 6 (six) hours as needed for itching.     . etanercept (ENBREL) 50 MG/ML injection Inject 50 mg into the skin every Saturday.    . furosemide (LASIX) 40 MG tablet Take 1.5 tablets (60 mg total) by mouth daily. (Patient  taking differently: Take 40 mg by mouth daily. )    . guaiFENesin (MUCINEX) 600 MG 12 hr tablet Take 1,200 mg by mouth 2 (two) times daily.    . insulin aspart (NOVOLOG FLEXPEN) 100 UNIT/ML FlexPen Inject 1-9 Units into the skin 3 (three) times daily. Use as directed 101-150=1U 151-200=2U 201-250=3U 251-300=5U 301-350=7U >350=9U     . insulin detemir (LEVEMIR) 100 UNIT/ML injection Inject 24 Units into the skin at bedtime.     Marland Kitchen ipratropium-albuterol (DUONEB) 0.5-2.5 (3) MG/3ML SOLN Take 3 mLs by nebulization 3 (three) times daily.     . Magnesium 400 MG CAPS Take 400 mg by mouth at bedtime.     . Melatonin 10 MG TABS Take 10 mg by mouth at bedtime.     . Multiple Vitamins-Minerals (CERTAVITE SENIOR/ANTIOXIDANT) TABS Take 1 tablet by mouth daily.    . nicotine (NICODERM CQ - DOSED IN MG/24 HOURS) 14 mg/24hr patch Place 14 mg onto the skin at bedtime.    . OXYGEN 2 L. BIPAP with 1lpm o2     . pantoprazole (PROTONIX) 40 MG tablet Take 40 mg by mouth 2 (two) times daily.     . polyethylene glycol (MIRALAX) packet Take 17 g by mouth daily. 5 each 0  . potassium chloride SA (K-DUR,KLOR-CON) 20 MEQ tablet Take 40 mEq by mouth daily.    . sodium chloride (OCEAN) 0.65 % SOLN nasal spray Place 1 spray into both nostrils See admin instructions. Use 1 spray in each nostril nightly, may use 1 spray in each nostril every 1 hour as needed for sinus congestion    . Tiotropium Bromide Monohydrate (SPIRIVA RESPIMAT) 2.5 MCG/ACT AERS Inhale 2 puffs into the lungs daily. (Patient not taking: Reported on 10/31/2018) 1 Inhaler 0  . vitamin C (ASCORBIC ACID) 500 MG tablet Take 1,000 mg by mouth at bedtime.     No current facility-administered medications for this visit.    Allergies:   Chantix [varenicline]    Social History:  The patient  reports that he quit smoking about 21 months ago. His smoking use included cigarettes. He has a 5.30 pack-year smoking history. He has never used smokeless tobacco. He  reports previous alcohol use. He reports that he does not use drugs.   Family History:  The patient's family history includes ADD / ADHD in his son; Asthma in his father; Depression in his son; Emphysema in his father; Heart attack in his mother; Heart disease in his brother and mother; Heart murmur in his brother; Hypertension in his brother; Osteoarthritis in his father; Uterine cancer in his mother.    ROS: All other systems are reviewed and negative. Unless otherwise mentioned in H&P    P  Lipid Panel    Component Value Date/Time   CHOL 183 07/21/2017 0155   TRIG 47 07/21/2017 0155   HDL 109 07/21/2017 0155   CHOLHDL 1.7 07/21/2017 0155   VLDL 9 07/21/2017 0155   LDLCALC 65 07/21/2017 0155      Wt Readings from Last 3 Encounters:  11/02/18 205 lb (93 kg)  10/24/18 204 lb 9.6 oz (92.8 kg)  04/12/18 202 lb (91.6 kg)      Other studies Reviewed: Echocardiogram 05-24-19 Abdominal Aorta: No evidence of an abdominal aortic aneurysm was  visualized. There is evidence of abnormal dilation of the Right Common  Iliac artery. The largest aortic measurement is 2.8 cm.  Stenosis: +--------------------+-------------+  Location      Stenosis     +--------------------+-------------+  Right External Iliac<50% stenosis  +--------------------+-------------+  Left External Iliac <50% stenosis  +--------------------+-------------+   No evidence of stenosis in aorta. There is atherosclerosis seen throughout  aorta and iliac arteries. Bilateral upper limits of normal distal EIA  velocities. Unable to duplicate velocities from prior exam.   IVC/Iliac: There is no evidence of thrombus involving the IVC   ASSESSMENT AND PLAN:  1.  ***   Current medicines are reviewed at length with the patient today.  I have spent *** dedicated to the care of this patient on the date of this encounter to include pre-visit review of records, assessment, management and diagnostic  testing,with shared decision making.  Labs/ tests ordered today include: *** Phill Myron. [place] Pugh, ANP, AACC   06/06/2019 5:35 PM    [place] [address] Office [phone] Fax [phone]  Notice: This dictation was prepared with Dragon dictation along with smaller phrase technology. Any transcriptional errors that result from this process are unintentional and may not be corrected upon review.

## 2019-06-07 ENCOUNTER — Encounter: Payer: Self-pay | Admitting: Adult Health

## 2019-06-07 ENCOUNTER — Telehealth (INDEPENDENT_AMBULATORY_CARE_PROVIDER_SITE_OTHER): Payer: Medicare Other | Admitting: Adult Health

## 2019-06-07 VITALS — HR 94 | Ht 67.0 in | Wt 196.0 lb

## 2019-06-07 DIAGNOSIS — Z87891 Personal history of nicotine dependence: Secondary | ICD-10-CM

## 2019-06-07 DIAGNOSIS — Z86711 Personal history of pulmonary embolism: Secondary | ICD-10-CM | POA: Diagnosis not present

## 2019-06-07 DIAGNOSIS — I714 Abdominal aortic aneurysm, without rupture, unspecified: Secondary | ICD-10-CM

## 2019-06-07 DIAGNOSIS — I5030 Unspecified diastolic (congestive) heart failure: Secondary | ICD-10-CM | POA: Diagnosis not present

## 2019-06-07 DIAGNOSIS — I824Y3 Acute embolism and thrombosis of unspecified deep veins of proximal lower extremity, bilateral: Secondary | ICD-10-CM

## 2019-06-07 DIAGNOSIS — I2510 Atherosclerotic heart disease of native coronary artery without angina pectoris: Secondary | ICD-10-CM | POA: Diagnosis not present

## 2019-06-07 DIAGNOSIS — I5032 Chronic diastolic (congestive) heart failure: Secondary | ICD-10-CM

## 2019-06-07 DIAGNOSIS — I10 Essential (primary) hypertension: Secondary | ICD-10-CM

## 2019-06-07 NOTE — Progress Notes (Signed)
Virtual Visit via Telephone Note   This visit type was conducted due to national recommendations for restrictions regarding the COVID-19 Pandemic (e.g. social distancing) in an effort to limit this patient's exposure and mitigate transmission in our community.  Due to his co-morbid illnesses, this patient is at least at moderate risk for complications without adequate follow up.  This format is felt to be most appropriate for this patient at this time.  The patient did not have access to video technology/had technical difficulties with video requiring transitioning to audio format only (telephone).  All issues noted in this document were discussed and addressed.  No physical exam could be performed with this format.  Please refer to the patient's chart for his  consent to telehealth for [facility].   Date:  06/07/2019   ID:  [name], DOB 05-17-1945, MRN [id number]  Patient Location: Home Provider Location: Office  PCP:  Georgann Housekeeper, MD  Cardiologist:  Olga Millers, MD  Electrophysiologist:  None   Evaluation Performed:  Follow-Up Visit    History of Present Illness:   Chad Duke is a 74 y.o. male who presents for ongoing assessment and management of CAD, history of PE, diastolic CHF.  Most recent nuclear study in June 2019 showed an ejection fraction of 76% with no ischemia.  Echocardiogram at the same time showed vigorous LV function mild left ventricular hypertrophy with grade 1 diastolic dysfunction, very mild aortic stenosis with mild biatrial enlargement.  A CTA in June 2019 showed left lower lobe pulmonary emboli.  Lasix was stopped at that time because of acute on chronic renal insufficiency.  Abdominal ultrasound in December 2019 revealed a 3.5 cm abdominal aortic aneurysm.  The patient has been made a DNR.  He as last seen by Dr. Jens Som on 10/24/2018.  No changes were made in her medication regimen, there were no new testing planned.  He did have a repeat  ultrasound to check the size of his aorta on 04/26/2019 which revealed compared to prior aortic duplex exam showed a suprailiac 3.4 x 3.5 cm enlargement of aorta with normal tapering of the aorta.  He is planned for repeat aortic Doppler study in 1 year.  On speaking with him today he has had a GI illness with frequent diarrhea.  He has moved out of [facility] and is now living in his own apartment.  He is eating better, he is medically compliant.  He denies any bleeding or bruising or hemoptysis.  He denies any cardiac symptoms of chest pain dyspnea on exertion fatigue or dizziness.  His medications are delivered to him.  The patient does not have symptoms concerning for COVID-19 infection (fever, chills, cough, or new shortness of breath).    Past Medical History:  Diagnosis Date  . AAA (abdominal aortic aneurysm) (HCC)   . Allergy    Rhinitis  . Arthritis    Rhuematoid  . Asthma   . CHF (congestive heart failure) (HCC)    chronic diastolic (congestive) heart failure from notes from [facility] face sheet  . Chronic renal insufficiency   . Depression    per notes from [facility] on chart  . Diverticulosis   . Emphysema   . Falls frequently   . GERD (gastroesophageal reflux disease)   . Hearing loss of both ears   . Hiatal hernia   . Hyperlipidemia   . Hypertension   . Male hypogonadism   . Nephrolithiasis   . Nephrolithiasis   . Sleep apnea  On CPAP  . Stasis dermatitis   . Varicose veins   . Vitamin B 12 deficiency    Past Surgical History:  Procedure Laterality Date  . CARPAL TUNNEL RELEASE     bilateral  . CYSTOSCOPY/URETEROSCOPY/HOLMIUM LASER/STENT PLACEMENT Left 11/02/2018   Procedure: CYSTOSCOPY/URETEROSCOPY/HOLMIUM LASER/STENT PLACEMENT;  Surgeon: Lucas Mallow, MD;  Location: [facility];  Service: Urology;  Laterality: Left;  . FRACTURE SURGERY Left    Fibula  . JOINT REPLACEMENT Left    Shoulder  . JOINT REPLACEMENT Left    Hip   . KIDNEY STONE SURGERY   2003  . left hip replacement    . left knee    . left shoulder    . LITHOTRIPSY       Current Meds  Medication Sig  . albuterol (PROVENTIL HFA;VENTOLIN HFA) 108 (90 Base) MCG/ACT inhaler Inhale 2 puffs into the lungs every 4 (four) hours as needed for wheezing or shortness of breath.  . allopurinol (ZYLOPRIM) 100 MG tablet Take 100 mg by mouth daily.  Marland Kitchen alum & mag hydroxide-simeth (MAALOX/MYLANTA) 200-200-20 MG/5ML suspension Take 30 mLs by mouth every 4 (four) hours as needed for indigestion or heartburn.  Marland Kitchen apixaban (ELIQUIS) 5 MG TABS tablet Take 1 tablet (5 mg total) by mouth 2 (two) times daily.  Marland Kitchen atorvastatin (LIPITOR) 40 MG tablet Take 1 tablet (40 mg total) by mouth daily. (Patient taking differently: Take 40 mg by mouth at bedtime. )  . budesonide (PULMICORT) 0.25 MG/2ML nebulizer solution Take 0.25 mg by nebulization 2 (two) times daily.  Marland Kitchen CALCIUM-MAGNESIUM-VITAMIN D PO Take 1 tablet by mouth daily.  Marland Kitchen etanercept (ENBREL) 50 MG/ML injection Inject 50 mg into the skin every Saturday.  . furosemide (LASIX) 40 MG tablet Take 40 mg by mouth daily.  Marland Kitchen gabapentin (NEURONTIN) 300 MG capsule Take 300 mg by mouth daily.  Marland Kitchen ipratropium-albuterol (DUONEB) 0.5-2.5 (3) MG/3ML SOLN Take 3 mLs by nebulization 3 (three) times daily.   . Magnesium 400 MG CAPS Take 400 mg by mouth at bedtime.   . Melatonin 10 MG TABS Take 10 mg by mouth at bedtime.   . metFORMIN (GLUCOPHAGE) 500 MG tablet Take 500 mg by mouth 2 (two) times daily.  . pantoprazole (PROTONIX) 40 MG tablet Take 40 mg by mouth 2 (two) times daily.   . potassium chloride SA (K-DUR,KLOR-CON) 20 MEQ tablet Take 40 mEq by mouth daily.  . sodium chloride (OCEAN) 0.65 % SOLN nasal spray Place 1 spray into both nostrils See admin instructions. Use 1 spray in each nostril nightly, may use 1 spray in each nostril every 1 hour as needed for sinus congestion  . vitamin C (ASCORBIC ACID) 500 MG tablet Take 1,000 mg by mouth at bedtime.  .  [DISCONTINUED] bisacodyl (DULCOLAX) 10 MG suppository Place 10 mg rectally daily as needed for moderate constipation.  . [DISCONTINUED] Multiple Vitamins-Minerals (CERTAVITE SENIOR/ANTIOXIDANT) TABS Take 1 tablet by mouth daily.  . [DISCONTINUED] OXYGEN 2 L. BIPAP with 1lpm o2   . [DISCONTINUED] Tiotropium Bromide Monohydrate (SPIRIVA RESPIMAT) 2.5 MCG/ACT AERS Inhale 2 puffs into the lungs daily.     Allergies:   Chantix [varenicline]   Social History   Tobacco Use  . Smoking status: Former Smoker    Packs/day: 0.10    Years: 53.00    Pack years: 5.30    Types: Cigarettes    Quit date: 08/11/2017    Years since quitting: 1.8  . Smokeless tobacco: Never Used  . Tobacco comment:  1-2 cigarettes a day   Substance Use Topics  . Alcohol use: Not Currently    Alcohol/week: 0.0 standard drinks  . Drug use: No     Family Hx: The patient's family history includes ADD / ADHD in his son; Asthma in his father; Depression in his son; Emphysema in his father; Heart attack in his mother; Heart disease in his brother and mother; Heart murmur in his brother; Hypertension in his brother; Osteoarthritis in his father; Uterine cancer in his mother.  ROS:   Please see the history of present illness.    All other systems reviewed and are negative.   Prior CV studies:   The following studies were reviewed today: Aortic ultrasound as above.  Labs/Other Tests and Data Reviewed:    EKG:  No ECG reviewed.  Recent Labs: 11/02/2018: BUN 20; Creatinine, Ser 1.48; Hemoglobin 10.3; Platelets 182; Potassium 3.4; Sodium 140   Recent Lipid Panel Lab Results  Component Value Date/Time   CHOL 183 07/21/2017 01:55 AM   TRIG 47 07/21/2017 01:55 AM   HDL 109 07/21/2017 01:55 AM   CHOLHDL 1.7 07/21/2017 01:55 AM   LDLCALC 65 07/21/2017 01:55 AM    Wt Readings from Last 3 Encounters:  06/07/19 196 lb (88.9 kg)  11/02/18 205 lb (93 kg)  10/24/18 204 lb 9.6 oz (92.8 kg)     Objective:    Vital Signs:   Pulse 94   Ht 5\' 7"  (1.702 m)   Wt 196 lb (88.9 kg)   SpO2 93%   BMI 30.70 kg/m    VITAL SIGNS:  reviewed GEN:  no acute distress RESPIRATORY:  normal respiratory effort, symmetric expansion NEURO:  alert and oriented x 3, no obvious focal deficit PSYCH:  normal affect  ASSESSMENT & PLAN:    1.  History of AAA: Repeat ultrasound did not reveal any significant change in diameter of aorta or aneurysm.  He is due to have a repeat ultrasound in 1 year.  2.  Coronary artery disease: Most recent nuclear study in June 2019 revealed no ischemia.  3.  History of pulmonary emboli: Continues on apixaban 5 mg twice daily.  No evidence of bleeding bruising or significant hematomas.  He does have some bruising from hitting the inside of a door jam but this is getting better.   COVID-19 Education: The signs and symptoms of COVID-19 were discussed with the patient and how to seek care for testing (follow up with PCP or arrange E-visit).  The importance of social distancing was discussed today.  Time:   Today, I have spent 20 minutes with the patient with telehealth technology discussing the above problems.     Medication Adjustments/Labs and Tests Ordered: Current medicines are reviewed at length with the patient today.  Concerns regarding medicines are outlined above.   Tests Ordered: No orders of the defined types were placed in this encounter.   Medication Changes: No orders of the defined types were placed in this encounter.   Disposition:  Follow up 6 months  Signed, July 2019. Bettey Mare, ANP, AACC  06/07/2019 4:32 PM    [facility]

## 2019-06-07 NOTE — Progress Notes (Signed)
Virtual Visit via Telephone Note   This visit type was conducted due to national recommendations for restrictions regarding the COVID-19 Pandemic (e.g. social distancing) in an effort to limit this patient's exposure and mitigate transmission in our community.  Due to his co-morbid illnesses, this patient is at least at moderate risk for complications without adequate follow up.  This format is felt to be most appropriate for this patient at this time.  The patient did not have access to video technology/had technical difficulties with video requiring transitioning to audio format only (telephone).  All issues noted in this document were discussed and addressed.  No physical exam could be performed with this format.  Please refer to the patient's chart for his  consent to telehealth for [facility].   Date:  06/07/2019   ID:  Chad Duke, DOB 04/14/1945, MRN [id number]  Patient Location: Home Provider Location: Home  PCP:  Georgann Housekeeper, MD  Cardiologist:  Olga Millers, MD  Electrophysiologist:  None   Evaluation Performed:  Follow up  History of Present Illness:    Chad Duke is a 74 y.o. male who presents for ongoing assessment and management of CAD, history of PE, diastolic CHF.  Most recent nuclear study in June 2019 showed an ejection fraction of 76% with no ischemia.  Echocardiogram at the same time showed vigorous LV function mild left ventricular hypertrophy with grade 1 diastolic dysfunction, very mild aortic stenosis with mild biatrial enlargement.  A CTA in June 2019 showed left lower lobe pulmonary emboli.  Lasix was stopped at that time because of acute on chronic renal insufficiency.  Abdominal ultrasound in December 2019 revealed a 3.5 cm abdominal aortic aneurysm.  The patient has been made a DNR.  He as last seen by Dr. Jens Som on 10/24/2018.  No changes were made in her medication regimen, there were no new testing planned.  He did have a repeat ultrasound to  check the size of his aorta on 04/26/2019 which revealed compared to prior aortic duplex exam showed a suprailiac 3.4 x 3.5 cm enlargement of aorta with normal tapering of the aorta.  He is planned for repeat aortic Doppler study in 1 year.  On speaking with him today he has had a GI illness with frequent diarrhea.  He has moved out of [facility] and is now living in his own apartment.  He is eating better, he is medically compliant.  He denies any bleeding or bruising or hemoptysis.  He denies any cardiac symptoms of chest pain dyspnea on exertion fatigue or dizziness.  His medications are delivered to him.   The patient does not have symptoms concerning for COVID-19 infection (fever, chills, cough, or new shortness of breath).    Past Medical History:  Diagnosis Date  . AAA (abdominal aortic aneurysm) (HCC)   . Allergy    Rhinitis  . Arthritis    Rhuematoid  . Asthma   . CHF (congestive heart failure) (HCC)    chronic diastolic (congestive) heart failure from notes from [facility] face sheet  . Chronic renal insufficiency   . Depression    per notes from [facility] on chart  . Diverticulosis   . Emphysema   . Falls frequently   . GERD (gastroesophageal reflux disease)   . Hearing loss of both ears   . Hiatal hernia   . Hyperlipidemia   . Hypertension   . Male hypogonadism   . Nephrolithiasis   . Nephrolithiasis   . Sleep apnea  On CPAP  . Stasis dermatitis   . Varicose veins   . Vitamin B 12 deficiency    Past Surgical History:  Procedure Laterality Date  . CARPAL TUNNEL RELEASE     bilateral  . CYSTOSCOPY/URETEROSCOPY/HOLMIUM LASER/STENT PLACEMENT Left 11/02/2018   Procedure: CYSTOSCOPY/URETEROSCOPY/HOLMIUM LASER/STENT PLACEMENT;  Surgeon: Crista Elliot, MD;  Location: [facility];  Service: Urology;  Laterality: Left;  . FRACTURE SURGERY Left    Fibula  . JOINT REPLACEMENT Left    Shoulder  . JOINT REPLACEMENT Left    Hip   . KIDNEY STONE SURGERY  2003  .  left hip replacement    . left knee    . left shoulder    . LITHOTRIPSY       Current Meds  Medication Sig  . albuterol (PROVENTIL HFA;VENTOLIN HFA) 108 (90 Base) MCG/ACT inhaler Inhale 2 puffs into the lungs every 4 (four) hours as needed for wheezing or shortness of breath.  . allopurinol (ZYLOPRIM) 100 MG tablet Take 100 mg by mouth daily.  Marland Kitchen alum & mag hydroxide-simeth (MAALOX/MYLANTA) 200-200-20 MG/5ML suspension Take 30 mLs by mouth every 4 (four) hours as needed for indigestion or heartburn.  Marland Kitchen apixaban (ELIQUIS) 5 MG TABS tablet Take 1 tablet (5 mg total) by mouth 2 (two) times daily.  Marland Kitchen atorvastatin (LIPITOR) 40 MG tablet Take 1 tablet (40 mg total) by mouth daily. (Patient taking differently: Take 40 mg by mouth at bedtime. )  . budesonide (PULMICORT) 0.25 MG/2ML nebulizer solution Take 0.25 mg by nebulization 2 (two) times daily.  Marland Kitchen CALCIUM-MAGNESIUM-VITAMIN D PO Take 1 tablet by mouth daily.  Marland Kitchen etanercept (ENBREL) 50 MG/ML injection Inject 50 mg into the skin every Saturday.  . furosemide (LASIX) 40 MG tablet Take 40 mg by mouth daily.  Marland Kitchen gabapentin (NEURONTIN) 300 MG capsule Take 300 mg by mouth daily.  Marland Kitchen ipratropium-albuterol (DUONEB) 0.5-2.5 (3) MG/3ML SOLN Take 3 mLs by nebulization 3 (three) times daily.   . Magnesium 400 MG CAPS Take 400 mg by mouth at bedtime.   . Melatonin 10 MG TABS Take 10 mg by mouth at bedtime.   . metFORMIN (GLUCOPHAGE) 500 MG tablet Take 500 mg by mouth 2 (two) times daily.  . pantoprazole (PROTONIX) 40 MG tablet Take 40 mg by mouth 2 (two) times daily.   . potassium chloride SA (K-DUR,KLOR-CON) 20 MEQ tablet Take 40 mEq by mouth daily.  . sodium chloride (OCEAN) 0.65 % SOLN nasal spray Place 1 spray into both nostrils See admin instructions. Use 1 spray in each nostril nightly, may use 1 spray in each nostril every 1 hour as needed for sinus congestion  . vitamin C (ASCORBIC ACID) 500 MG tablet Take 1,000 mg by mouth at bedtime.  . [DISCONTINUED]  bisacodyl (DULCOLAX) 10 MG suppository Place 10 mg rectally daily as needed for moderate constipation.  . [DISCONTINUED] Multiple Vitamins-Minerals (CERTAVITE SENIOR/ANTIOXIDANT) TABS Take 1 tablet by mouth daily.  . [DISCONTINUED] OXYGEN 2 L. BIPAP with 1lpm o2   . [DISCONTINUED] Tiotropium Bromide Monohydrate (SPIRIVA RESPIMAT) 2.5 MCG/ACT AERS Inhale 2 puffs into the lungs daily.     Allergies:   Chantix [varenicline]   Social History   Tobacco Use  . Smoking status: Former Smoker    Packs/day: 0.10    Years: 53.00    Pack years: 5.30    Types: Cigarettes    Quit date: 08/11/2017    Years since quitting: 1.8  . Smokeless tobacco: Never Used  . Tobacco comment:  1-2 cigarettes a day   Substance Use Topics  . Alcohol use: Not Currently    Alcohol/week: 0.0 standard drinks  . Drug use: No     Family Hx: The patient's family history includes ADD / ADHD in his son; Asthma in his father; Depression in his son; Emphysema in his father; Heart attack in his mother; Heart disease in his brother and mother; Heart murmur in his brother; Hypertension in his brother; Osteoarthritis in his father; Uterine cancer in his mother.  ROS:   Please see the history of present illness.    All other systems reviewed and are negative.   Prior CV studies:   The following studies were reviewed today: Abdominal ultrasound as described above  Labs/Other Tests and Data Reviewed:    EKG:  No ECG reviewed.  Recent Labs: 11/02/2018: BUN 20; Creatinine, Ser 1.48; Hemoglobin 10.3; Platelets 182; Potassium 3.4; Sodium 140   Recent Lipid Panel Lab Results  Component Value Date/Time   CHOL 183 07/21/2017 01:55 AM   TRIG 47 07/21/2017 01:55 AM   HDL 109 07/21/2017 01:55 AM   CHOLHDL 1.7 07/21/2017 01:55 AM   LDLCALC 65 07/21/2017 01:55 AM    Wt Readings from Last 3 Encounters:  06/07/19 196 lb (88.9 kg)  11/02/18 205 lb (93 kg)  10/24/18 204 lb 9.6 oz (92.8 kg)     Objective:    Vital Signs:   Pulse 94   Ht 5\' 7"  (1.702 m)   Wt 196 lb (88.9 kg)   SpO2 93%   BMI 30.70 kg/m    VITAL SIGNS:  reviewed GEN:  no acute distress RESPIRATORY:  normal respiratory effort, symmetric expansion NEURO:  alert and oriented x 3, no obvious focal deficit PSYCH:  normal affect  ASSESSMENT & PLAN:    1.  History of AAA: Repeat ultrasound did not reveal any significant change in diameter of aorta or aneurysm.  He is due to have a repeat ultrasound in 1 year.  2.  Coronary artery disease: Most recent nuclear study in June 2019 revealed no ischemia.  3.  History of pulmonary emboli: Continues on apixaban 5 mg twice daily.  No evidence of bleeding bruising or significant hematomas.  He does have some bruising from hitting the inside of a door jam but this is getting better.    COVID-19 Education: The signs and symptoms of COVID-19 were discussed with the patient and how to seek care for testing (follow up with PCP or arrange E-visit).  The importance of social distancing was discussed today.  Time:   Today, I have spent 20 minutes with the patient with telehealth technology discussing the above problems.     Medication Adjustments/Labs and Tests Ordered: Current medicines are reviewed at length with the patient today.  Concerns regarding medicines are outlined above.   Tests Ordered: No orders of the defined types were placed in this encounter.   Medication Changes: No orders of the defined types were placed in this encounter.   Disposition:  Follow up 6 months   Signed, Phill Myron. West Pugh, ANP, AACC  06/07/2019 4:22 PM    [facility]

## 2019-06-07 NOTE — Patient Instructions (Signed)

## 2019-08-27 IMAGING — US US ABDOMEN LIMITED
1 series · 14 of 25 positions shown · non-contrast
Comparison: None.

CLINICAL DATA: Elevated liver function studies.  Increased BMI.

EXAM:
ULTRASOUND ABDOMEN LIMITED RIGHT UPPER QUADRANT

[Series 1: us abdomen limited · 0.25mm/px · 14 of 32 slices shown]
[im 1/32]
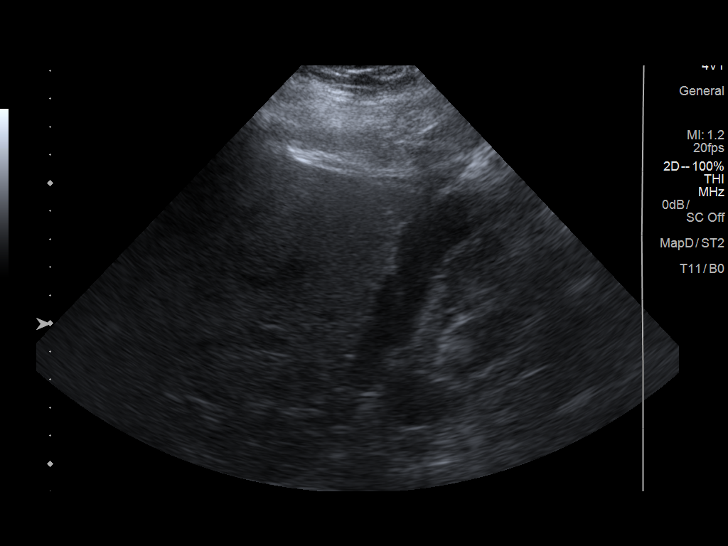
[im 3/32]
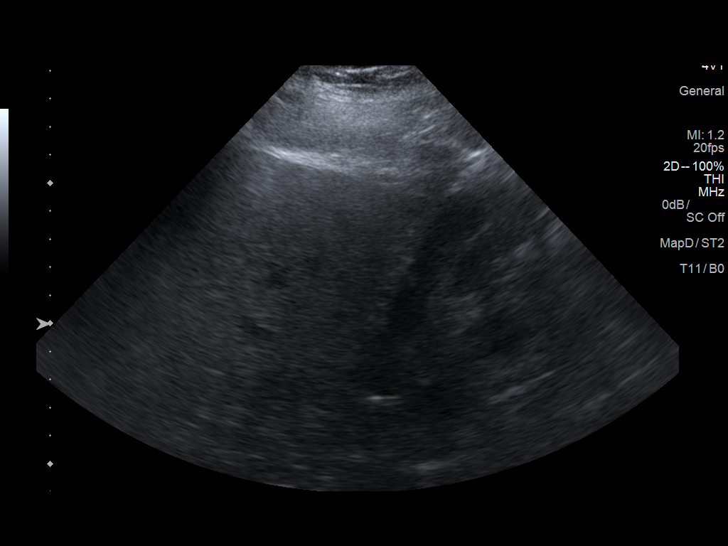
[im 6/32]
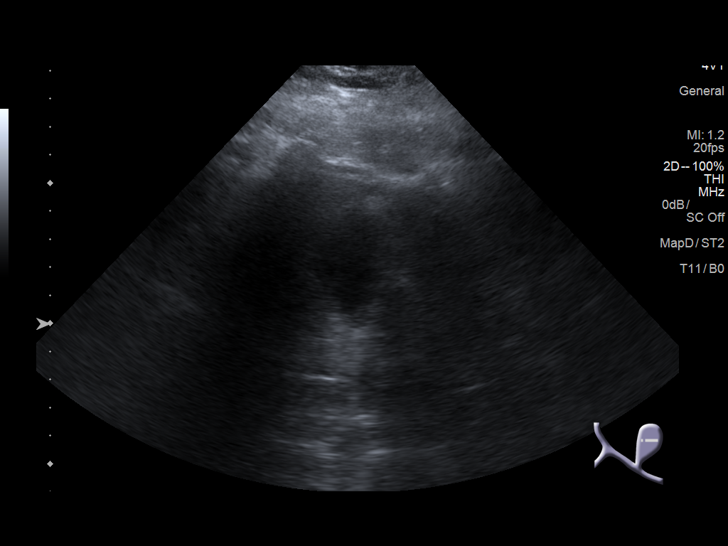
[im 8/32]
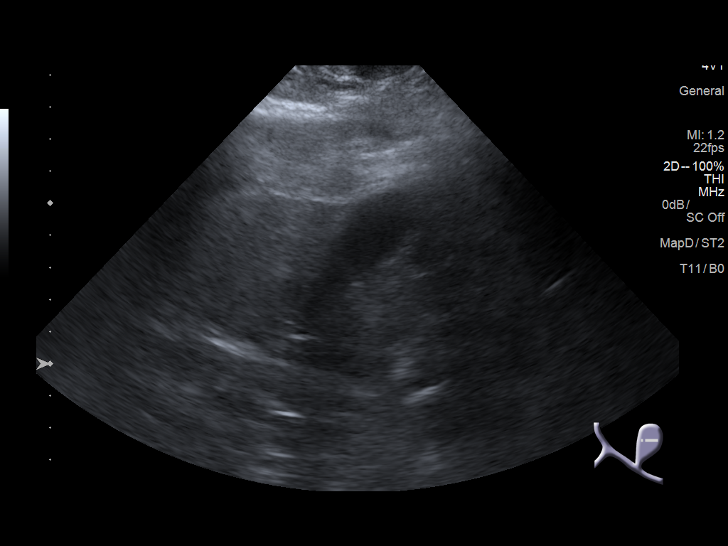
[im 11/32]
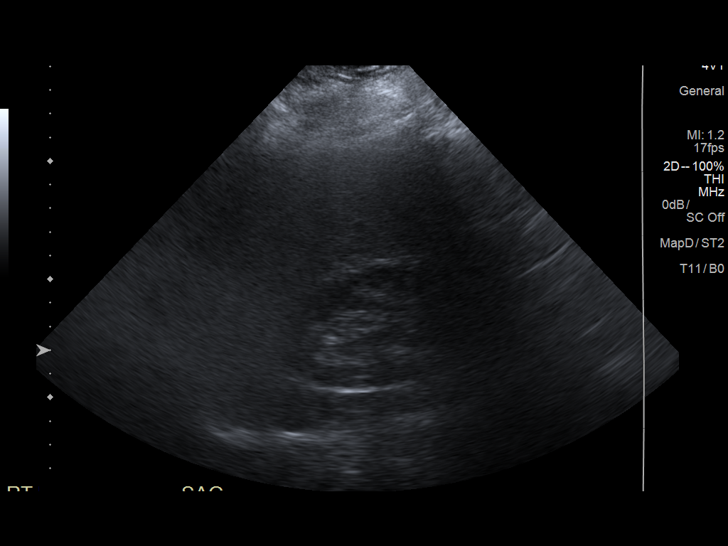
[im 12/32]
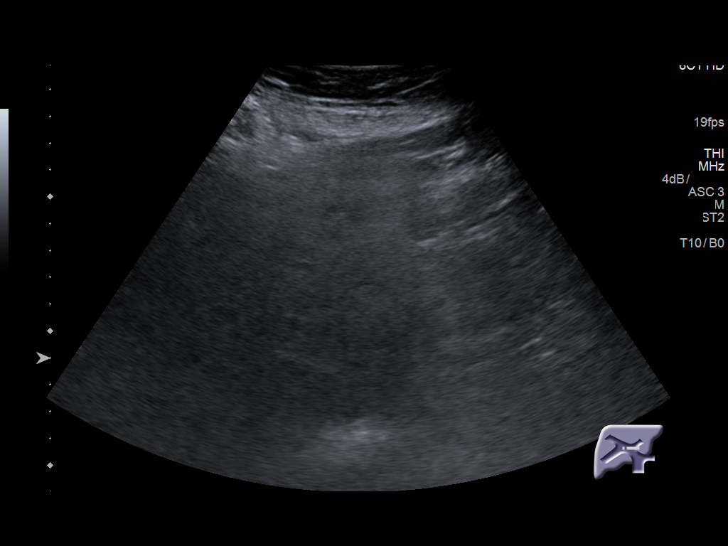
[im 15/32]
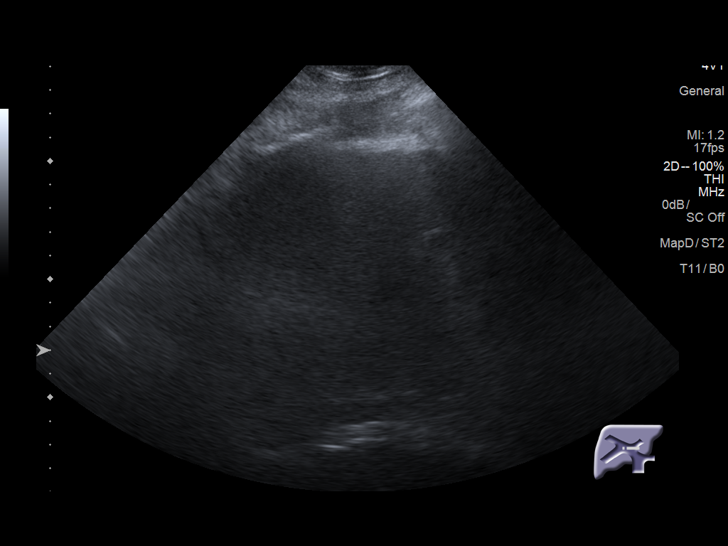
[im 17/32]
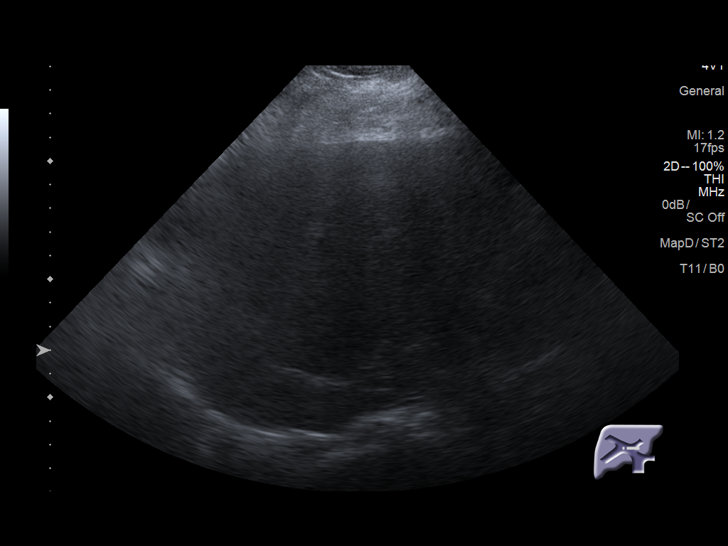
[im 20/32]
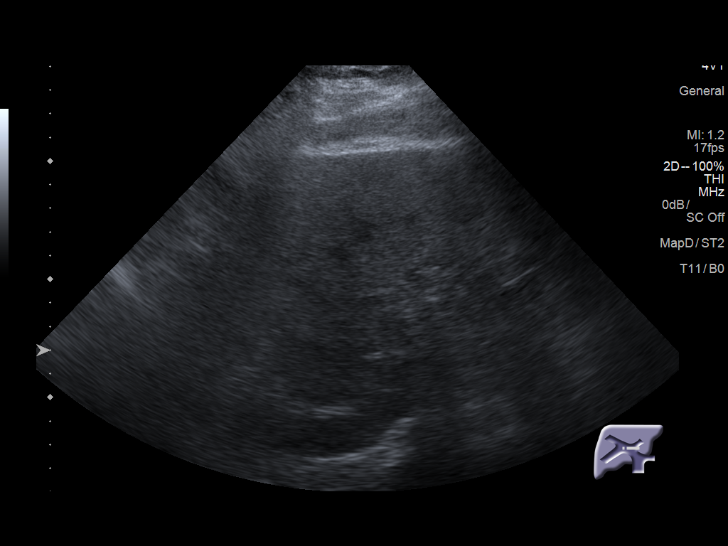
[im 21/32]
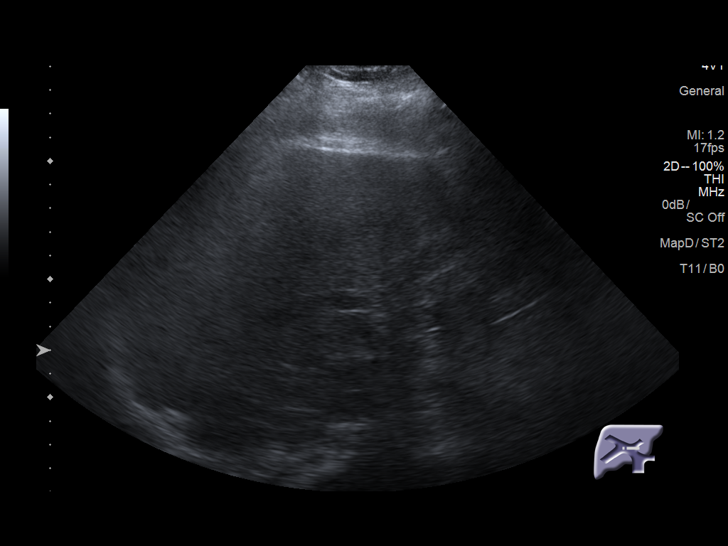
[im 24/32]
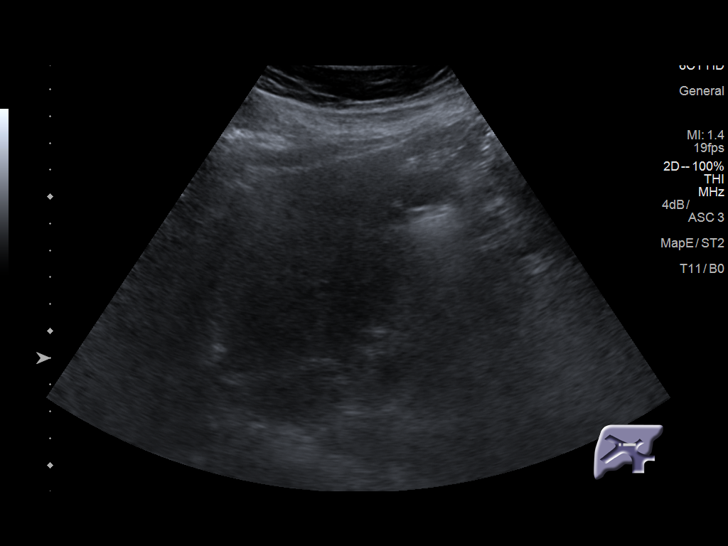
[im 26/32]
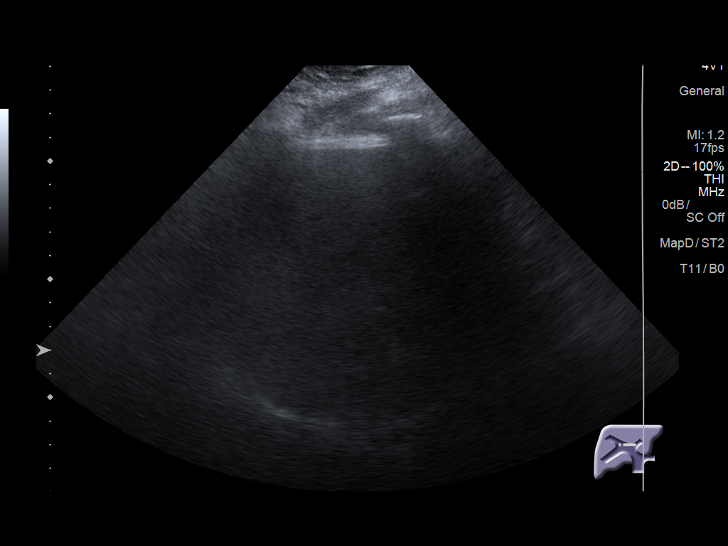
[im 29/32]
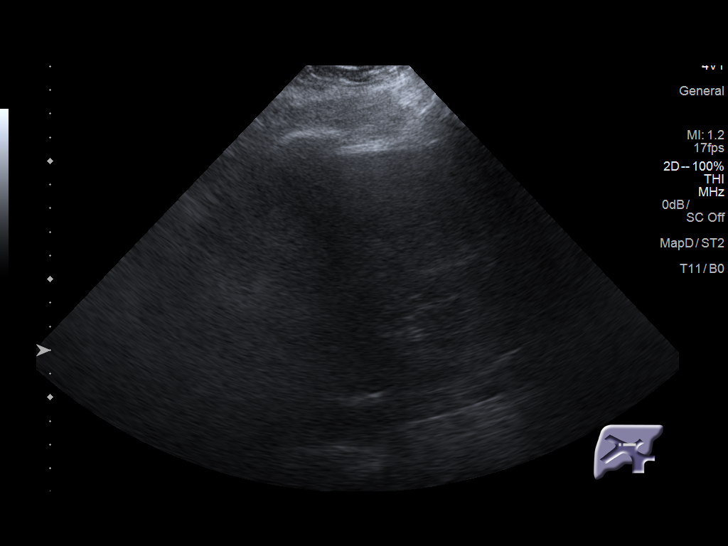
[im 32/32]
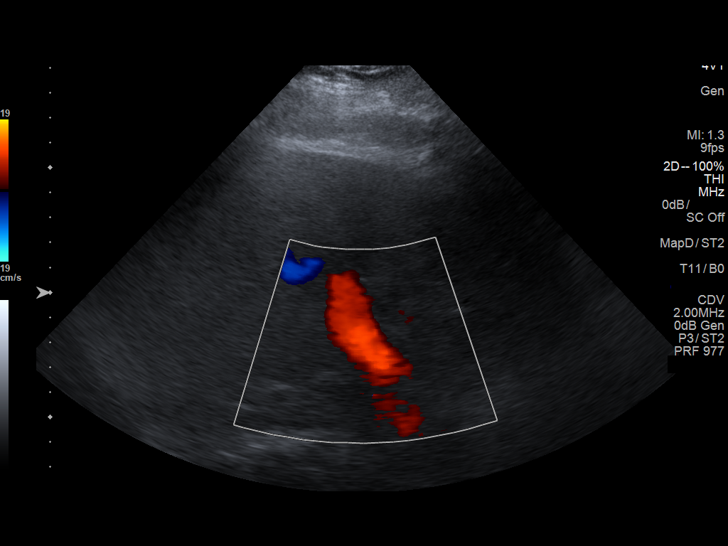

[14 of 25 positions shown; findings below may reference images not displayed]

FINDINGS: Gallbladder:

Gallbladder is somewhat contracted, likely physiologic. No stones,
sludge, or gallbladder wall thickening. Murphy's sign is negative.

Common bile duct:

Diameter: 2.9 mm, normal

Liver:

Diffusely increased hepatic parenchymal echotexture consistent with
diffuse fatty infiltration. No focal lesions identified. Poor
penetration due to fatty infiltration limits examination. Portal
vein is patent on color Doppler imaging with normal direction of
blood flow towards the liver.
IMPRESSION: No evidence of cholelithiasis or cholecystitis. Fatty infiltration
of the liver.

## 2019-09-07 DIAGNOSIS — M25552 Pain in left hip: Secondary | ICD-10-CM | POA: Diagnosis not present

## 2019-09-07 DIAGNOSIS — M545 Low back pain: Secondary | ICD-10-CM | POA: Diagnosis not present

## 2019-11-16 ENCOUNTER — Other Ambulatory Visit: Payer: Self-pay

## 2019-11-16 ENCOUNTER — Ambulatory Visit
Admission: RE | Admit: 2019-11-16 | Discharge: 2019-11-16 | Disposition: A | Discharge status: Home / Self Care | Payer: Medicare Other | Source: Ambulatory Visit | Attending: Acute Care | Admitting: Acute Care

## 2019-11-16 DIAGNOSIS — Z87891 Personal history of nicotine dependence: Secondary | ICD-10-CM

## 2019-11-16 DIAGNOSIS — F17210 Nicotine dependence, cigarettes, uncomplicated: Secondary | ICD-10-CM

## 2019-11-23 NOTE — Progress Notes (Signed)
I have tried calling Chad Duke. There was no answer. I have left a HIPPA compliant message on his VM.  His scan was read as a LR 0>> incomplete. Additional lung cancer screening CT images/or comparison to prior chest CT examinations is needed. He has progressive/ recurrent RML atelectasis, at the site of nodule of interest on the prior exam. The patient could be treated with antibiotics and follow-up lung cancer screening exam performed at 3 to 6 months. Alternatively, if there are not infectious symptoms, given chronicity of the atelectasis, bronchoscopy could be performed to evaluate this area.  Angelique Blonder, If he calls back when I am not here, we need to know if he has been having symptoms of infection.( Fever, Cough, chest pain, increased secretions/ color change to secretions.) If so , message me and I will send some in. I saw from his med profile, he has required several prednisone tapers recently... so I suspect he does need antibiotics.   He may need referral to Pulmonary for bronch.  Thanks

## 2019-11-23 NOTE — Progress Notes (Signed)
He will need a follow up scan in 3 months. Pease let his PCP know!! Thanks so much

## 2019-11-23 NOTE — Progress Notes (Signed)
He also needs an echo, so if he does not have a cardiologist, we need to refer him to [facility]. Thanks

## 2019-11-24 ENCOUNTER — Telehealth: Payer: Self-pay | Admitting: Acute Care

## 2019-11-27 NOTE — Telephone Encounter (Signed)
LMTC x 1  

## 2019-11-28 NOTE — Telephone Encounter (Signed)
Patient available before 1:00 pm and after 3:30 pm.

## 2019-11-28 NOTE — Telephone Encounter (Signed)
Patient is returning phone call. Patient phone number is [phone].

## 2019-11-30 ENCOUNTER — Telehealth: Payer: Self-pay | Admitting: Acute Care

## 2019-11-30 NOTE — Telephone Encounter (Signed)
Patient has already been scheduled at requested appt date/time.  Will close encounter.

## 2019-11-30 NOTE — Telephone Encounter (Signed)
I called the patient again today. I discussed the scan results with him. I had sent a message to Triage earlier to place patient on my scheduled 10/27 at 12 noon to discuss results. Please place patient on my schedule as noted above. Thanks so much

## 2019-11-30 NOTE — Telephone Encounter (Signed)
I have attempted to cal the patient again. There was no answer. I have left a HIPPA compliant message on the VM of the number he requested we call. Additionally I have left the office contact information.  Triage, if patient calls back, please route to me. [phone].  I am in the office until 1 pm. Thanks

## 2019-11-30 NOTE — Telephone Encounter (Signed)
Routing back to SG at her request.  Thanks!

## 2019-11-30 NOTE — Telephone Encounter (Signed)
Lease add patient to my schedule 10/27 at 1200  Follow up Lung cancer screening. He knows time and date and does not need another call. Thanks so much

## 2019-11-30 NOTE — Telephone Encounter (Signed)
Patient is already scheduled for this date and time.  Nothing further needed at this time- will close encounter.

## 2019-12-06 ENCOUNTER — Other Ambulatory Visit: Payer: Self-pay | Admitting: Acute Care

## 2019-12-06 ENCOUNTER — Encounter: Payer: Self-pay | Admitting: Acute Care

## 2019-12-06 ENCOUNTER — Ambulatory Visit (INDEPENDENT_AMBULATORY_CARE_PROVIDER_SITE_OTHER): Payer: Medicare Other | Admitting: Acute Care

## 2019-12-06 ENCOUNTER — Other Ambulatory Visit: Payer: Self-pay

## 2019-12-06 VITALS — BP 120/70 | HR 78 | Temp 97.9°F | Ht 67.0 in | Wt 207.2 lb

## 2019-12-06 DIAGNOSIS — Z720 Tobacco use: Secondary | ICD-10-CM

## 2019-12-06 DIAGNOSIS — F17210 Nicotine dependence, cigarettes, uncomplicated: Secondary | ICD-10-CM

## 2019-12-06 DIAGNOSIS — G4733 Obstructive sleep apnea (adult) (pediatric): Secondary | ICD-10-CM

## 2019-12-06 DIAGNOSIS — J449 Chronic obstructive pulmonary disease, unspecified: Secondary | ICD-10-CM | POA: Diagnosis not present

## 2019-12-06 DIAGNOSIS — R918 Other nonspecific abnormal finding of lung field: Secondary | ICD-10-CM | POA: Diagnosis not present

## 2019-12-06 MED ORDER — DOXYCYCLINE HYCLATE 100 MG PO TABS
100.0000 mg | ORAL_TABLET | Freq: Two times a day (BID) | ORAL | 0 refills | Status: DC
Start: 2019-12-06 — End: 2020-01-09

## 2019-12-06 MED ORDER — DOXYCYCLINE HYCLATE 100 MG PO TABS
100.0000 mg | ORAL_TABLET | Freq: Two times a day (BID) | ORAL | 0 refills | Status: DC
Start: 2019-12-06 — End: 2019-12-06

## 2019-12-06 NOTE — Progress Notes (Signed)
History of Present Illness Chad Duke is a 74 y.o. male current every day smoker ( 106 pack year smoking history) with history of RA, GOLD III COPD and RML collapse,and now an abnormal Low Dose Ct chest. . He has been followed by Dr. Sherene Sires.  RA on MTX and Remicade  Childhood asthma  OSA on BiPAP -followed by [facility] Medical Stage III CKD  DuoNeb and Pulmicort nebs as maintenence   12/06/2019  Pt. Presents for follow up of abnormal LDCT.This was a 6 month follow up of a previous Lung Rads 3 screening scan.  His repeat LDCT was read as a LR 0 due to Progressive/recurrent right middle lobe atelectasis, at the site of nodule of interest on the prior exam. And a new posterior right upper lobe pulmonary nodule of volume derived 5 mm that needs follow up attention. Recommendation was for treatment with antibiotics if symptomatic, or bronchoscopy if asymptomatic, and follow up CT in 3-6 months. The patient presents today to discuss this finding . He states he has had some increase in his secretions, but he denies any fever or chest pain. Secretions have not changed in color.No change from baseline.  The patient does have RA, and is on immunosuppressive medications. I will go ahead and treat with antibiotics, and we will repeat the CT in 3 months.We will also start aggressive  In addition to discussing the CT scan, the patient has requested that he get established at our practice for his BiPAP therapy, as he is not happy with [facility], and has not seen them in 2 years. He is still getting BiPAP supplies from [facility]. Additionally he would like to see another provider, as he feels he and Dr. Sherene Sires have a personality clash. He has not been using his home oxygen . He is not monitoring his sats.   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 He has audible wheezes on exam. Sats were 98% on RA at today's appointment   Test Results: 11/16/2019 Low Dose CT Lung-RADS 3,  probably benign findings. Short-term follow-up in 6 months is recommended with repeat low-dose chest CT without contrast (please use the following order, "CT CHEST LCS NODULE FOLLOW-UP W/O CM"). 7.5 mm nodule in the right middle lobe, in an area of prior right middle lobe atelectasis, favoring residual nodular scarring/atelectasis but technically indeterminate.  PFT September 2018 showed moderate obstruction with an FEV1 of 53%, ratio 56, FVC 68%, no significant bronchodilator response, DLCO 64%.   Spirometry April 2019 showed similar obstruction with FEV1 at 51%, ratio 50, FVC 74%. 03/2017 Echo >EF 65 to 70% GR 1 DD .  High-resolution CT chest February 2019 Negative for ILD, chronic right middle lobe collapse.     CBC Latest Ref Rng & Units 11/02/2018 04/12/2018 10/03/2017  WBC 4.0 - 10.5 K/uL 9.7 10.7 -  Hemoglobin 13.0 - 17.0 g/dL 10.3(L) 9.8(L) 10.9(L)  Hematocrit 39 - 52 % 32.9(L) 30.2(L) 32.0(L)  Platelets 150 - 400 K/uL 182 225 -    BMP Latest Ref Rng & Units 11/02/2018 04/12/2018 10/03/2017  Glucose 70 - 99 mg/dL 979(G) 89 921(J)  BUN 8 - 23 mg/dL 20 23 94(R)  Creatinine 0.61 - 1.24 mg/dL 7.40(C) 1.44(Y) 1.85(U)  BUN/Creat Ratio 10 - 24 - 16 -  Sodium 135 - 145 mmol/L 140 140 136  Potassium 3.5 - 5.1 mmol/L 3.4(L) 4.3 3.1(L)  Chloride 98 - 111 mmol/L 106 100 99  CO2 22 -  32 mmol/L 25 25 -  Calcium 8.9 - 10.3 mg/dL 9.0 8.7 -    BNP    Component Value Date/Time   BNP 98.2 09/26/2017 0102    ProBNP No results found for: PROBNP  PFT    Component Value Date/Time   FEV1PRE 1.39 11/04/2016 1444   FEV1POST 1.49 11/04/2016 1444   FVCPRE 2.46 11/04/2016 1444   FVCPOST 2.65 11/04/2016 1444   TLC 5.72 11/04/2016 1444   DLCOUNC 18.38 11/04/2016 1444   PREFEV1FVCRT 56 11/04/2016 1444   PSTFEV1FVCRT 56 11/04/2016 1444    CT CHEST LCS NODULE F/U W/O CONTRAST  Result Date: 11/17/2019 CLINICAL DATA:  Fifty-six pack-year smoking history. Current smoker. Short-term follow-up of  right middle lobe nodule. EXAM: CT CHEST WITHOUT CONTRAST FOR LUNG CANCER SCREENING NODULE FOLLOW-UP TECHNIQUE: Multidetector CT imaging of the chest was performed following the standard protocol without IV contrast. COMPARISON:  05/29/2019 FINDINGS: Cardiovascular: Aortic atherosclerosis. Tortuous thoracic aorta. Mild cardiomegaly. Multivessel coronary artery atherosclerosis. Aortic valve calcification. Mediastinum/Nodes: No mediastinal or definite hilar adenopathy, given limitations of unenhanced CT. Lungs/Pleura: Small left pleural effusion is similar. Trace right pleural thickening is unchanged. Mild to moderate centrilobular emphysema. Upper Abdomen: Normal imaged portions of the liver, spleen, stomach, pancreas, gallbladder, adrenal glands. Mild renal cortical thinning bilaterally. Interpolar right renal 1.6 cm low-density lesion is likely a cyst. Upper pole right renal collecting system calculi of up to the 3 mm. There is progressive atelectasis within the right middle lobe. This presumably obscures the previously described right middle lobe nodule of interest. Example 175/5. This is relatively similar to the 01/04/2018 exam. A posterior right upper lobe nodule of volume derived equivalent diameter 5.0 mm, including on image 68/5, is new. Musculoskeletal: Left shoulder arthroplasty. Remote right rib fractures. IMPRESSION: 1. Lung-RADS 0, incomplete. Additional lung cancer screening CT images/or comparison to prior chest CT examinations is needed. Progressive/recurrent right middle lobe atelectasis, at the site of nodule of interest on the prior exam. The patient could be treated with antibiotics and follow-up lung cancer screening exam performed at 3 to 6 months. Alternatively, if there are not infectious symptoms, given chronicity of the atelectasis, bronchoscopy could be performed to evaluate this area. 2. New posterior right upper lobe pulmonary nodule of volume derived equivalent diameter 5.0 mm,  warranting follow-up attention. 3. Aortic atherosclerosis (ICD10-I70.0), coronary artery atherosclerosis and emphysema (ICD10-J43.9). 4. Aortic valvular calcifications. Consider echocardiography to evaluate for valvular dysfunction. 5. Similar small left pleural effusion 6. Right nephrolithiasis. Electronically Signed   By: Jeronimo Greaves M.D.   On: 11/17/2019 09:32     Past medical hx Past Medical History:  Diagnosis Date  . AAA (abdominal aortic aneurysm) (HCC)   . Allergy    Rhinitis  . Arthritis    Rhuematoid  . Asthma   . CHF (congestive heart failure) (HCC)    chronic diastolic (congestive) heart failure from notes from [facility] face sheet  . Chronic renal insufficiency   . Depression    per notes from [facility] on chart  . Diverticulosis   . Emphysema   . Falls frequently   . GERD (gastroesophageal reflux disease)   . Hearing loss of both ears   . Hiatal hernia   . Hyperlipidemia   . Hypertension   . Male hypogonadism   . Nephrolithiasis   . Nephrolithiasis   . Sleep apnea    On CPAP  . Stasis dermatitis   . Varicose veins   . Vitamin B 12 deficiency  Social History   Tobacco Use  . Smoking status: Current Every Day Smoker    Packs/day: 2.00    Years: 53.00    Pack years: 106.00    Types: Cigarettes  . Smokeless tobacco: Never Used  . Tobacco comment: currently smoking 1ppd  Vaping Use  . Vaping Use: Never used  Substance Use Topics  . Alcohol use: Not Currently    Alcohol/week: 0.0 standard drinks  . Drug use: No    Mr.Dooly reports that he has been smoking cigarettes. He has a 106.00 pack-year smoking history. He has never used smokeless tobacco. He reports previous alcohol use. He reports that he does not use drugs.  Tobacco Cessation: Current every day smoker , 106 pack year smoking history.  Past surgical hx, Family hx, Social hx all reviewed.  Current Outpatient Medications on File Prior to Visit  Medication Sig  . albuterol  (PROVENTIL HFA;VENTOLIN HFA) 108 (90 Base) MCG/ACT inhaler Inhale 2 puffs into the lungs every 4 (four) hours as needed for wheezing or shortness of breath.  . allopurinol (ZYLOPRIM) 100 MG tablet Take 100 mg by mouth daily.  Marland Kitchen apixaban (ELIQUIS) 5 MG TABS tablet Take 1 tablet (5 mg total) by mouth 2 (two) times daily.  Marland Kitchen atorvastatin (LIPITOR) 40 MG tablet Take 1 tablet (40 mg total) by mouth daily. (Patient taking differently: Take 40 mg by mouth at bedtime. )  . budesonide (PULMICORT) 0.25 MG/2ML nebulizer solution Take 0.25 mg by nebulization 2 (two) times daily.  Marland Kitchen CALCIUM-MAGNESIUM-VITAMIN D PO Take 1 tablet by mouth daily.  . furosemide (LASIX) 40 MG tablet Take 40 mg by mouth daily.  Marland Kitchen inFLIXimab (REMICADE) 100 MG injection Inject into the vein every 6 (six) weeks.  Marland Kitchen ipratropium-albuterol (DUONEB) 0.5-2.5 (3) MG/3ML SOLN Take 3 mLs by nebulization 3 (three) times daily.   . Magnesium 400 MG CAPS Take 400 mg by mouth at bedtime.   . Melatonin 10 MG TABS Take 10 mg by mouth at bedtime.   . metFORMIN (GLUCOPHAGE) 500 MG tablet Take 500 mg by mouth 2 (two) times daily.  . pantoprazole (PROTONIX) 40 MG tablet Take 40 mg by mouth daily.   . potassium chloride SA (K-DUR,KLOR-CON) 20 MEQ tablet Take 40 mEq by mouth daily.  . predniSONE (DELTASONE) 20 MG tablet prednisone 20 mg tablet  TAKE 3 TABLETS EACH AM X4 DAYS.2 TABS EACH AM X4 DAYS.1 TABLET EACH AM X4 DAYS  . sodium chloride (OCEAN) 0.65 % SOLN nasal spray Place 1 spray into both nostrils See admin instructions. Use 1 spray in each nostril nightly, may use 1 spray in each nostril every 1 hour as needed for sinus congestion  . vitamin C (ASCORBIC ACID) 500 MG tablet Take 1,000 mg by mouth at bedtime.   No current facility-administered medications on file prior to visit.     Allergies  Allergen Reactions  . Chantix [Varenicline] Other (See Comments)    Suicidal thoughts    Review Of Systems:  Constitutional:   No  weight loss,  night sweats,  Fevers, chills, fatigue, or  lassitude.  HEENT:   No headaches,  Difficulty swallowing,  Tooth/dental problems, or  Sore throat,                No sneezing, itching, ear ache, nasal congestion, post nasal drip,   CV:  No chest pain,  Orthopnea, PND, swelling in lower extremities, anasarca, dizziness, palpitations, syncope.   GI  No heartburn, indigestion, abdominal pain, nausea, vomiting, diarrhea,  change in bowel habits, loss of appetite, bloody stools.   Resp: + shortness of breath with exertion none  at rest.  + excess mucus, + productive cough,  No non-productive cough,  No coughing up of blood.  No change in color of mucus.  + wheezing.  No chest wall deformity  Skin: no rash or lesions.  GU: no dysuria, change in color of urine, no urgency or frequency.  No flank pain, no hematuria   MS:  No joint pain or swelling.  No decreased range of motion.  No back pain.  Psych:  No change in mood or affect. No depression or anxiety.  No memory loss.   Vital Signs BP 120/70 (BP Location: Left Arm, Cuff Size: Normal)   Pulse 78   Temp 97.9 F (36.6 C) (Other (Comment)) Comment (Src): wrist  Ht 5\' 7"  (1.702 m)   Wt 207 lb 3.2 oz (94 kg)   SpO2 98%   BMI 32.45 kg/m    Physical Exam:  General- No distress,  A&Ox3, pleasant ENT: No sinus tenderness, TM clear, pale nasal mucosa, no oral exudate,no post nasal drip, no LAN Cardiac: S1, S2, regular rate and rhythm, no murmur Chest: No wheeze/ rales/ dullness; no accessory muscle use, no nasal flaring, no sternal retractions Abd.: Soft Non-tender Ext: No clubbing cyanosis, edema Neuro:  normal strength Skin: No rashes, warm and dry Psych: normal mood and behavior   Assessment/Plan  Abnormal LDCT Read as LR- 0  Immunocompromised on Remicade for RA Plan We will treat you with Doxycycline Take 1 tablet twice  daily x 7 days. Start Mucinex 1200  mg once daily Take with a full glass of water. Start Flutter valve 4-5  blows , four times daily This should help to bring up secretions.  Continue your Pulmicort and DuoNebs as you have been doing. Continue to use Albuterol as needed for wheezing or shortness of breath.  Will need walk at follow up to determine need for oxygen Follow up LDCT in 3 months ( 05/2020) Follow up  in 2 weeks with 10-10-1998 NP. If does not clear with medication, need to consider bronch  Would like to re-establish with [facility] for COPD and BiPAP therapy Would like to change providers from [place] to [place] ( as he is on BiPAP as well as has COPD) OSA Plan Will check with both MD's and schedule appointment if all are in agreement  Smoking Cessation Plan Counseled on smoking cessation   Follow up in 2 weeks with [place] NP  This appointment was 30 min long with over 50% of the time in direct face-to-face patient care, assessment, plan of care, and follow-up.   Maralyn Sago, MSN, AGACNP-BC [facility] See Amion for personal pager PCCM on call pager [phone]     [phone], NP 12/06/2019  12:17 PM

## 2019-12-06 NOTE — Patient Instructions (Addendum)
It is good to see you today. We will treat you with Doxycycline Take 1 tablet twice  daily x 7 days. Start Mucinex 1200  mg once daily Take with a full glass of water. Start Flutter valve 4-5 blows , four times daily This should help to bring up secretions.  Follow up LDCT in 3 months ( 05/2020) Follow up  in 2 weeks with Maralyn Sago NP. Needs CPAP follow up. Wants to establish with [facility].  Please schedule with Dr. Craige Cotta at earliest opening. Needs to establish for BiPAP and  Also wants to switch doc from [place] to [place]. We will walk patient at next appointment to ensure he does not need oxygen with exertion  Continue your Pulmicort and DuoNebs as you have been doing. Continue to use Albuterol as needed for wheezing or shortness of breath.  Continue BiPAP at bedtime as you have been doing.  Please work on quitting smoking. Please contact office for sooner follow up if symptoms do not improve or worsen or seek emergency care

## 2019-12-07 NOTE — Progress Notes (Signed)
Reviewed and agree with assessment/plan.   Kerby Borner, MD [facility] 12/07/2019, 8:58 AM Pager:  [phone]  

## 2019-12-08 ENCOUNTER — Other Ambulatory Visit: Payer: Self-pay | Admitting: *Deleted

## 2019-12-08 DIAGNOSIS — Z87891 Personal history of nicotine dependence: Secondary | ICD-10-CM

## 2019-12-08 DIAGNOSIS — F17210 Nicotine dependence, cigarettes, uncomplicated: Secondary | ICD-10-CM

## 2019-12-20 ENCOUNTER — Ambulatory Visit: Payer: Medicare Other | Admitting: Acute Care

## 2019-12-27 ENCOUNTER — Ambulatory Visit: Payer: Medicare Other | Admitting: Acute Care

## 2020-01-01 IMAGING — CR DG CHEST 2V
3 series · 3 of 3 positions shown · non-contrast
Comparison: 03/04/2017 chest radiograph

04/05/2017 chest CT

CLINICAL DATA: Shortness of breath

EXAM:
CHEST - 2 VIEW

[w chest lat]
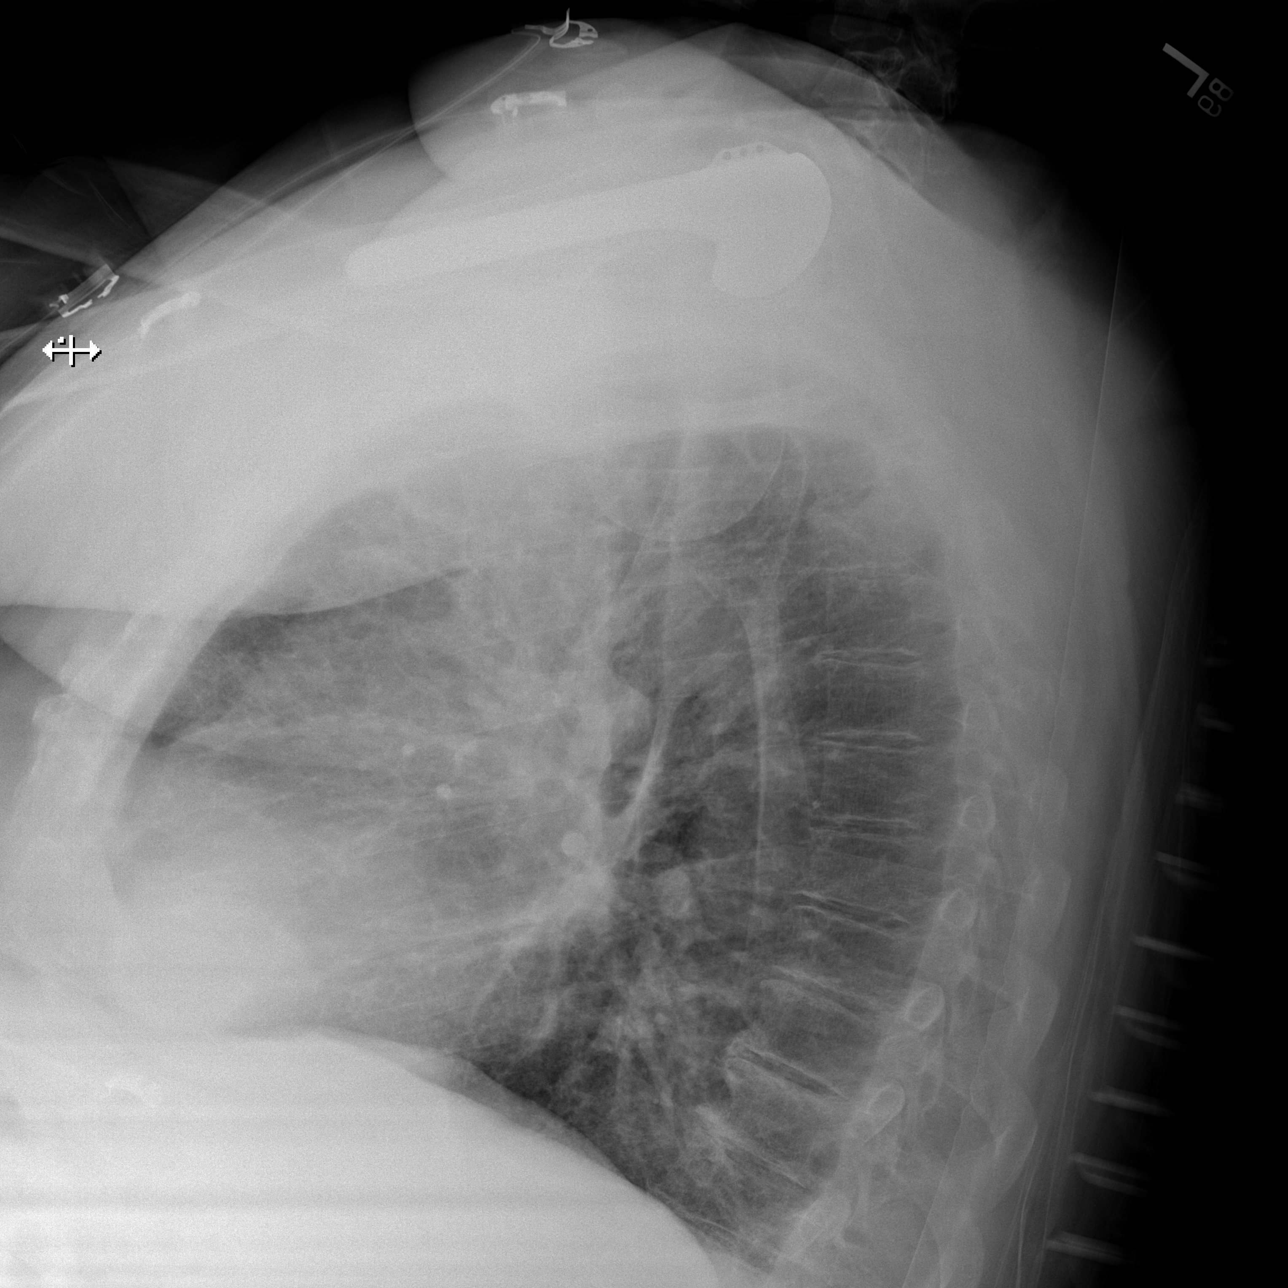

[x chest ap (1 of 2)]
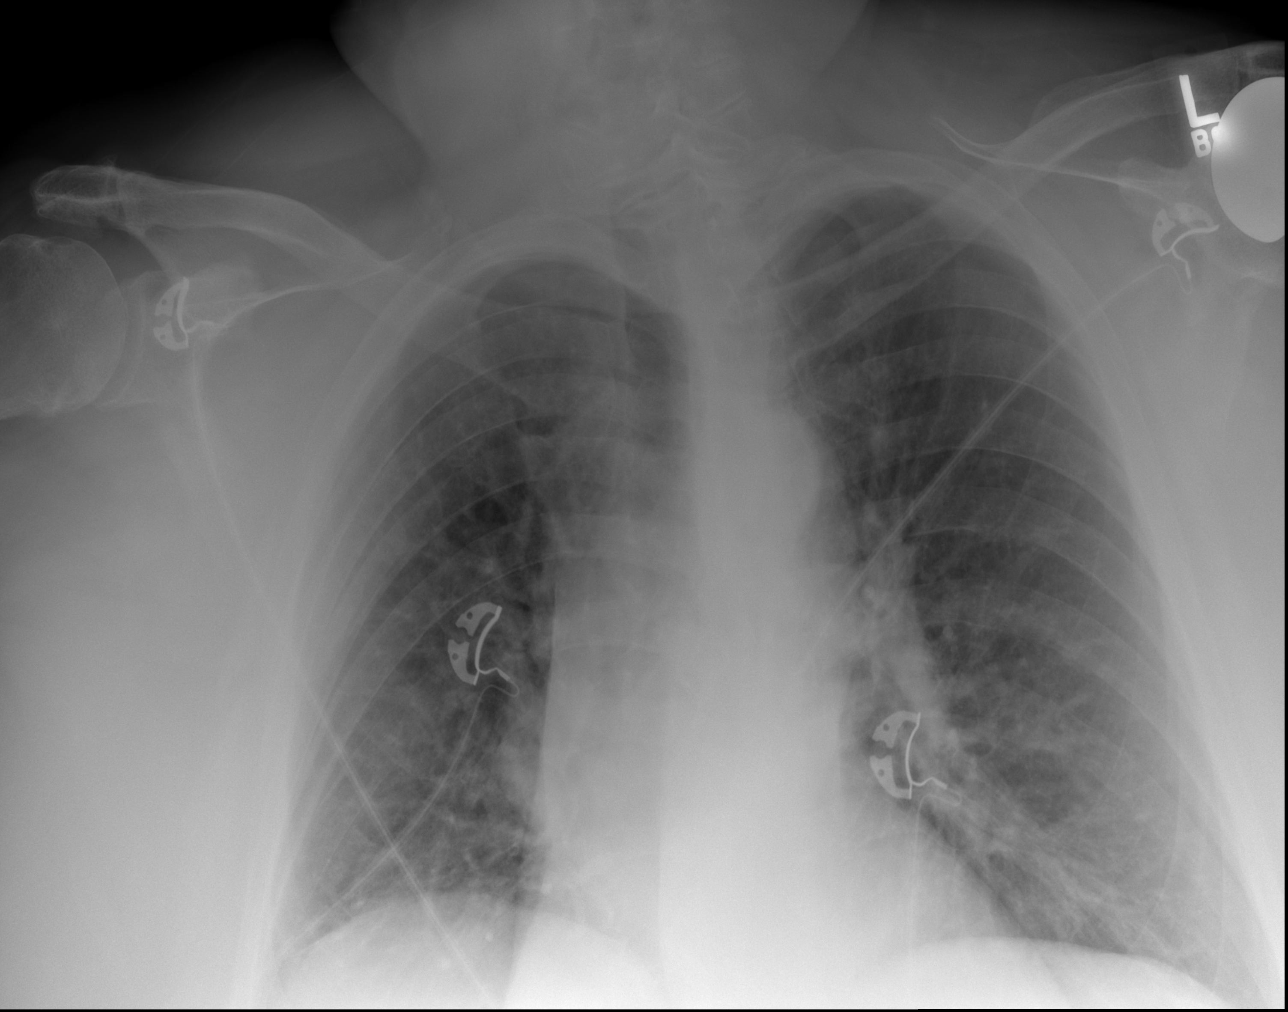

[x chest ap (2 of 2)]
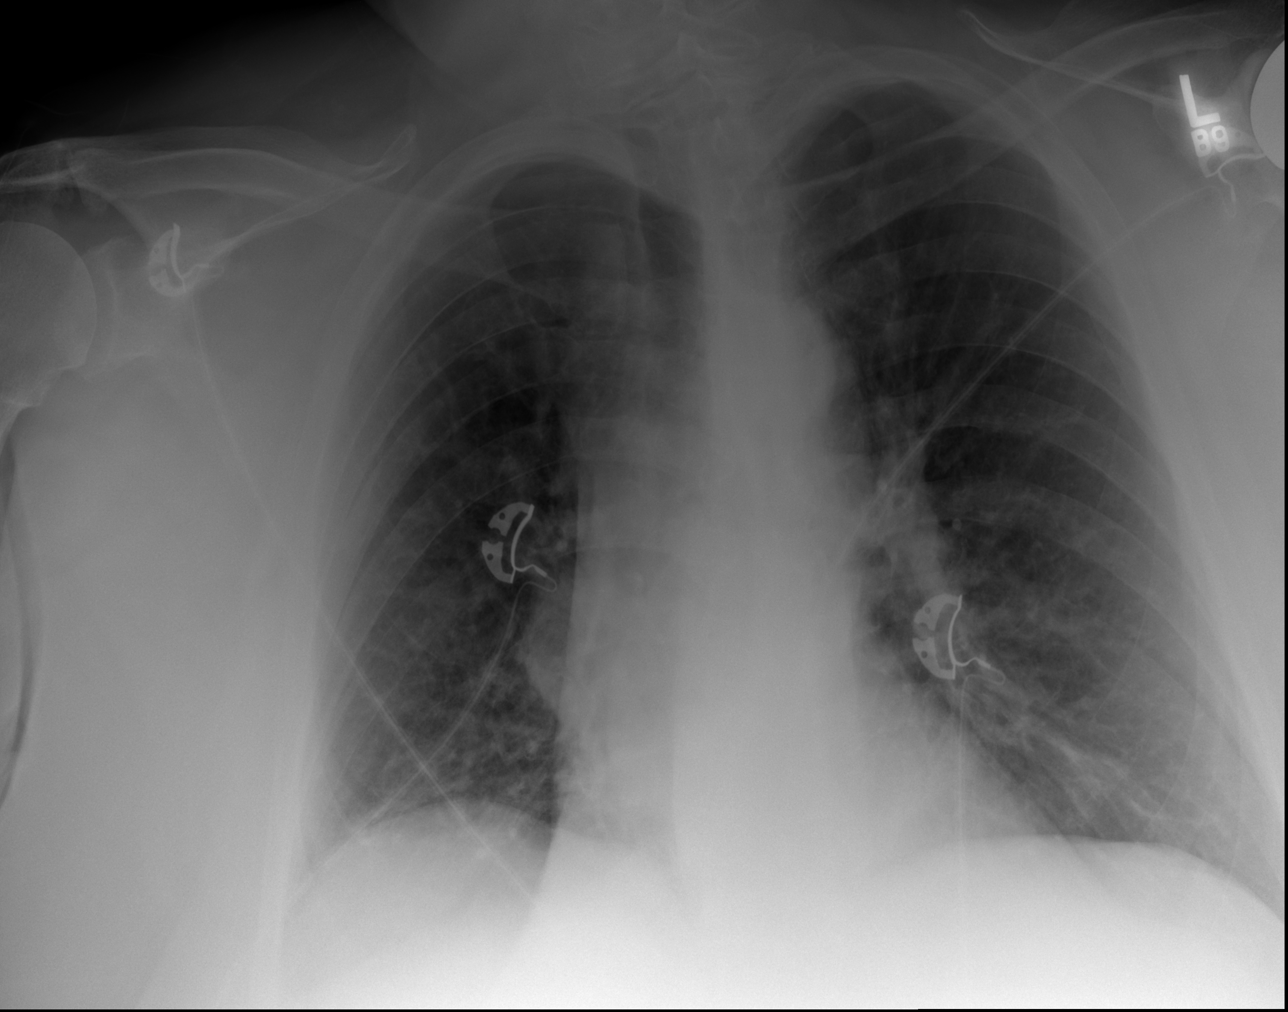

[3 of 3 positions shown; findings below may reference images not displayed]

FINDINGS: The heart size and mediastinal contours are within normal limits.
Both lungs are clear. The visualized skeletal structures are
unremarkable.
IMPRESSION: No active cardiopulmonary disease.

## 2020-01-09 ENCOUNTER — Ambulatory Visit (INDEPENDENT_AMBULATORY_CARE_PROVIDER_SITE_OTHER): Payer: Medicare Other

## 2020-01-09 ENCOUNTER — Encounter: Payer: Self-pay | Admitting: Pulmonary Disease

## 2020-01-09 ENCOUNTER — Other Ambulatory Visit: Payer: Self-pay

## 2020-01-09 ENCOUNTER — Ambulatory Visit (INDEPENDENT_AMBULATORY_CARE_PROVIDER_SITE_OTHER): Payer: Medicare Other | Admitting: Pulmonary Disease

## 2020-01-09 VITALS — BP 104/64 | HR 105 | Temp 97.6°F | Ht 67.0 in | Wt 217.0 lb

## 2020-01-09 DIAGNOSIS — Z8701 Personal history of pneumonia (recurrent): Secondary | ICD-10-CM | POA: Diagnosis not present

## 2020-01-09 DIAGNOSIS — Z720 Tobacco use: Secondary | ICD-10-CM

## 2020-01-09 DIAGNOSIS — G4733 Obstructive sleep apnea (adult) (pediatric): Secondary | ICD-10-CM

## 2020-01-09 DIAGNOSIS — J449 Chronic obstructive pulmonary disease, unspecified: Secondary | ICD-10-CM

## 2020-01-09 MED ORDER — BUPROPION HCL ER (SMOKING DET) 150 MG PO TB12: 150.0000 mg | ORAL_TABLET | Freq: Two times a day (BID) | ORAL | 3 refills | Status: DC

## 2020-01-09 NOTE — Progress Notes (Signed)
[facility]  Chief Complaint  Patient presents with  . Follow-up    go over bipap card    Constitutional:  BP 104/64 (BP Location: Right Arm, Cuff Size: Normal)   Pulse (!) 105   Temp 97.6 F (36.4 C) (Other (Comment)) Comment (Src): wrist  Ht  (1.702 m)   Wt 217 lb (98.4 kg)   SpO2 96% Comment: Room air  BMI 33.99 kg/m   Past Medical History:  RA, CKD 3, AAA, Diastolic CHF, Depression, Diverticulosis, GERD, Hearing loss, Hiatal hernia, HLD, HTN, Hypogonadism, Nephrolithiasis, Varicose veins, B12 deficiency, Gout, DM type 2  Past Surgical History:  His  has a past surgical history that includes Carpal tunnel release; Kidney stone surgery (2003); left shoulder; left hip replacement; left knee; Lithotripsy; Joint replacement (Left); Joint replacement (Left); Fracture surgery (Left); and Cystoscopy/ureteroscopy/holmium laser/stent placement (Left, 11/02/2018).  Brief Summary:  Chad Duke is a 74 y.o. male smoker with COPD and obstructive sleep apnea.      Subjective:   He had pneumonia few weeks ago.  Treated with amoxicillin and prednisone.  Feels better.  Still smokes 1 ppd.  Tried chantix but caused nightmares.  Has cough with clear sputum.  Occasional wheeze.  Not very active.  Was previously on oxygen, but "weaned" himself off.  He says his SpO2 at home can get into mid 80s at times, but then quickly recovers.  He doesn't want to restart supplemental oxygen unless he really needs to.  He has more leg swelling since he had pneumonia.  Using compression stockings.  Uses Bipap nightly.  No issues with mask fit or pressure settings.  Had low dose CT chest in October 2021.  Showed 5 mm RUL nodule.  Physical Exam:   Appearance - well kempt   ENMT - no sinus tenderness, no oral exudate, no LAN, Mallampati 3 airway, no stridor, raspy voice  Respiratory - decreased breath sounds bilaterally, no wheezing or rales  CV - s1s2  regular rate and rhythm, no murmurs  Ext - no clubbing, 1+ edema bilateral, wearing compression stockings  Skin - no rashes  Psych - normal mood and affect   Pulmonary testing:   PFT 11/04/16 >> FEV1 1.49 (53%), FEV1% 56, TLC 5.72 (88%), DLCO 64%  A1AT 05/31/17 >> 178, MM  Chest Imaging:   CT angio chest 07/20/17 >> LLL PE  V/Q scan 09/26/17 >> no perfusion defect  LDCT chest 11/17/19 >> small Lt effusion, pleural thickening on Rt, mild/mod centrilobular emphysema, 5 mm RUL nodule  Sleep Tests:   PSG 07/27/99 >> AHI 158.9  Bipap 10/12/19 to 11/10/19 >> used on 30 of 30 nights with average 7 hrs 49 min.  Average AHI 1.6 with Bipap 15/8 cm H2O  Cardiac Tests:   Echo 07/20/17 >> EF 65 to 70%, mild LVH, grade 1 DD, mild LA/RA dilation  Social History:  He  reports that he has been smoking cigarettes. He has a 106.00 pack-year smoking history. He has never used smokeless tobacco. He reports previous alcohol use. He reports that he does not use drugs.  Family History:  His family history includes ADD / ADHD in his son; Asthma in his father; Depression in his son; Emphysema in his father; Heart attack in his mother; Heart disease in his brother and mother; Heart murmur in his brother; Hypertension in his brother; Osteoarthritis in his father; Uterine cancer in his mother.     Assessment/Plan:   COPD with  chronic bronchitis and emphysema. - continue pulmicort and duoneb nebulizer - prn albuterol - will need to discuss pneumococcal vaccination at his next visit  Obstructive sleep apnea. - he is compliant with Bipap and reports benefit from therapy - uses [facility] for his DME - continue Bipap 15/8 cm H2O - will arrange for overnight oximetry with Bipap and then determine if he needs to reconsider nocturnal oxygen use  Recent episode of pneumonia. - will repeat chest xray today  Leg edema. - will have him increase lasix to 60 mg daily on 01/10/20 and 01/11/20; then resume lasix  40 mg daily  Tobacco abuse. - he was intolerant of chantix - he is willing to try bupropion; reviewed dosing schedule, setting quit date, and side effects to monitor for  Lung cancer screening. - follow up LDCT chest in April 2022  Rheumatoid arthritis. - on remicade - followed by Dr. Dierdre Forth with rheumatology  CAD, AAA, HTN, HLD, PE. - followed by Dr. Jens Som with [facility]  Time Spent Involved in Patient Care on Day of Examination:  45 minutes  Follow up:  Patient Instructions  Bupropion 150 mg pill >> 1 pill daily for 3 days, then 1 pill twice per day.  Set quit date to stop smoking after you have been on bupropion for 1 week.  Take 60 mg of lasix on 01/10/20 and 01/11/20.  Go back to 40 mg of lasix on 01/12/20.  Will arrange for overnight oxygen test with Bipap.  Follow up in 2 months with Dr. Craige Cotta or Nurse Practitioner.   Medication List:   Allergies as of 01/09/2020      Reactions   Varenicline Other (See Comments)   Suicidal thoughts Bad dreams Other reaction(s): Other (See Comments) Suicidal thoughts      Medication List       Accurate as of January 09, 2020  5:28 PM. If you have any questions, ask your nurse or doctor.        STOP taking these medications   doxycycline 100 MG tablet Commonly known as: VIBRA-TABS Stopped by: Coralyn Helling, MD   Melatonin 10 MG Tabs Stopped by: Coralyn Helling, MD     TAKE these medications   albuterol 108 (90 Base) MCG/ACT inhaler Commonly known as: VENTOLIN HFA Inhale 2 puffs into the lungs every 4 (four) hours as needed for wheezing or shortness of breath.   allopurinol 100 MG tablet Commonly known as: ZYLOPRIM Take 100 mg by mouth daily.   apixaban 5 MG Tabs tablet Commonly known as: Eliquis Take 1 tablet (5 mg total) by mouth 2 (two) times daily.   atorvastatin 40 MG tablet Commonly known as: LIPITOR Take 1 tablet (40 mg total) by mouth daily. What changed: when to take this   budesonide 0.25  MG/2ML nebulizer solution Commonly known as: PULMICORT Take 0.25 mg by nebulization 2 (two) times daily.   buPROPion 150 MG 12 hr tablet Commonly known as: ZYBAN Take 1 tablet (150 mg total) by mouth 2 (two) times daily. Started by: Coralyn Helling, MD   CALCIUM-MAGNESIUM-VITAMIN D PO Take 1 tablet by mouth daily.   furosemide 40 MG tablet Commonly known as: LASIX Take 40 mg by mouth daily.   ipratropium-albuterol 0.5-2.5 (3) MG/3ML Soln Commonly known as: DUONEB Take 3 mLs by nebulization 3 (three) times daily.   Magnesium 400 MG Caps Take 400 mg by mouth at bedtime.   metFORMIN 500 MG tablet Commonly known as: GLUCOPHAGE Take 500 mg by mouth 2 (  two) times daily.   pantoprazole 40 MG tablet Commonly known as: PROTONIX Take 40 mg by mouth daily.   potassium chloride SA 20 MEQ tablet Commonly known as: KLOR-CON Take 40 mEq by mouth daily.   predniSONE 20 MG tablet Commonly known as: DELTASONE prednisone 20 mg tablet  TAKE 3 TABLETS EACH AM X4 DAYS.2 TABS EACH AM X4 DAYS.1 TABLET EACH AM X4 DAYS   Remicade 100 MG injection Generic drug: inFLIXimab Inject into the vein every 6 (six) weeks.   sodium chloride 0.65 % Soln nasal spray Commonly known as: OCEAN Place 1 spray into both nostrils See admin instructions. Use 1 spray in each nostril nightly, may use 1 spray in each nostril every 1 hour as needed for sinus congestion   vitamin C 500 MG tablet Commonly known as: ASCORBIC ACID Take 1,000 mg by mouth at bedtime.       Signature:  Coralyn Helling, MD [facility] Pager - [phone] 01/09/2020, 5:28 PM

## 2020-01-09 NOTE — Patient Instructions (Signed)
Bupropion 150 mg pill >> 1 pill daily for 3 days, then 1 pill twice per day.  Set quit date to stop smoking after you have been on bupropion for 1 week.  Take 60 mg of lasix on 01/10/20 and 01/11/20.  Go back to 40 mg of lasix on 01/12/20.  Will arrange for overnight oxygen test with Bipap.  Follow up in 2 months with Dr. Craige Cotta or Nurse Practitioner.

## 2020-01-17 ENCOUNTER — Ambulatory Visit: Payer: Medicare Other | Admitting: Acute Care

## 2020-01-19 ENCOUNTER — Emergency Department (HOSPITAL_COMMUNITY): Payer: Medicare Other

## 2020-01-19 ENCOUNTER — Emergency Department (HOSPITAL_COMMUNITY)
Admission: EM | Admit: 2020-01-19 | Discharge: 2020-01-19 | Disposition: A | Discharge status: Home / Self Care | Payer: Medicare Other | Attending: Emergency Medicine | Admitting: Emergency Medicine

## 2020-01-19 DIAGNOSIS — R110 Nausea: Secondary | ICD-10-CM | POA: Insufficient documentation

## 2020-01-19 DIAGNOSIS — Z7984 Long term (current) use of oral hypoglycemic drugs: Secondary | ICD-10-CM | POA: Insufficient documentation

## 2020-01-19 DIAGNOSIS — F17210 Nicotine dependence, cigarettes, uncomplicated: Secondary | ICD-10-CM | POA: Insufficient documentation

## 2020-01-19 DIAGNOSIS — Z7901 Long term (current) use of anticoagulants: Secondary | ICD-10-CM | POA: Diagnosis not present

## 2020-01-19 DIAGNOSIS — Z96642 Presence of left artificial hip joint: Secondary | ICD-10-CM | POA: Diagnosis not present

## 2020-01-19 DIAGNOSIS — J45909 Unspecified asthma, uncomplicated: Secondary | ICD-10-CM | POA: Insufficient documentation

## 2020-01-19 DIAGNOSIS — I5033 Acute on chronic diastolic (congestive) heart failure: Secondary | ICD-10-CM | POA: Diagnosis not present

## 2020-01-19 DIAGNOSIS — Z20822 Contact with and (suspected) exposure to covid-19: Secondary | ICD-10-CM | POA: Insufficient documentation

## 2020-01-19 DIAGNOSIS — R262 Difficulty in walking, not elsewhere classified: Secondary | ICD-10-CM | POA: Diagnosis not present

## 2020-01-19 DIAGNOSIS — Z96652 Presence of left artificial knee joint: Secondary | ICD-10-CM | POA: Diagnosis not present

## 2020-01-19 DIAGNOSIS — Z7951 Long term (current) use of inhaled steroids: Secondary | ICD-10-CM | POA: Diagnosis not present

## 2020-01-19 DIAGNOSIS — I2510 Atherosclerotic heart disease of native coronary artery without angina pectoris: Secondary | ICD-10-CM | POA: Insufficient documentation

## 2020-01-19 DIAGNOSIS — I130 Hypertensive heart and chronic kidney disease with heart failure and stage 1 through stage 4 chronic kidney disease, or unspecified chronic kidney disease: Secondary | ICD-10-CM | POA: Diagnosis not present

## 2020-01-19 DIAGNOSIS — R531 Weakness: Secondary | ICD-10-CM | POA: Diagnosis present

## 2020-01-19 DIAGNOSIS — Z79899 Other long term (current) drug therapy: Secondary | ICD-10-CM | POA: Diagnosis not present

## 2020-01-19 DIAGNOSIS — Z96612 Presence of left artificial shoulder joint: Secondary | ICD-10-CM | POA: Insufficient documentation

## 2020-01-19 DIAGNOSIS — E1165 Type 2 diabetes mellitus with hyperglycemia: Secondary | ICD-10-CM | POA: Insufficient documentation

## 2020-01-19 DIAGNOSIS — N183 Chronic kidney disease, stage 3 unspecified: Secondary | ICD-10-CM | POA: Diagnosis not present

## 2020-01-19 LAB — TROPONIN I (HIGH SENSITIVITY): Troponin I (High Sensitivity): 13 ng/L (ref <18)

## 2020-01-19 LAB — COMPREHENSIVE METABOLIC PANEL
ALT: 21 U/L (ref 0–44)
AST: 23 U/L (ref 15–41)
Albumin: 3.4 g/dL — ABNORMAL LOW (ref 3.5–5.0)
Alkaline Phosphatase: 62 U/L (ref 38–126)
Anion gap: 12 (ref 5–15)
BUN: 24 mg/dL — ABNORMAL HIGH (ref 8–23)
CO2: 26 mmol/L (ref 22–32)
Calcium: 8.9 mg/dL (ref 8.9–10.3)
Chloride: 101 mmol/L (ref 98–111)
Creatinine, Ser: 1.57 mg/dL — ABNORMAL HIGH (ref 0.61–1.24)
GFR, Estimated: 46 mL/min — ABNORMAL LOW (ref >60)
Glucose, Bld: 82 mg/dL (ref 70–99)
Potassium: 4.1 mmol/L (ref 3.5–5.1)
Sodium: 139 mmol/L (ref 135–145)
Total Bilirubin: 0.7 mg/dL (ref 0.3–1.2)
Total Protein: 6.9 g/dL (ref 6.5–8.1)

## 2020-01-19 LAB — BLOOD GAS, VENOUS
Acid-Base Excess: 2.8 mmol/L — ABNORMAL HIGH (ref 0.0–2.0)
Bicarbonate: 27.3 mmol/L (ref 20.0–28.0)
O2 Saturation: 86.6 %
Patient temperature: 37
pCO2, Ven: 43.7 mmHg — ABNORMAL LOW (ref 44.0–60.0)
pH, Ven: 7.412 (ref 7.250–7.430)
pO2, Ven: 54.2 mmHg — ABNORMAL HIGH (ref 32.0–45.0)

## 2020-01-19 LAB — CBC WITH DIFFERENTIAL/PLATELET
Abs Immature Granulocytes: 0.01 10*3/uL (ref 0.00–0.07)
Basophils Absolute: 0 10*3/uL (ref 0.0–0.1)
Basophils Relative: 0 %
Eosinophils Absolute: 0.2 10*3/uL (ref 0.0–0.5)
Eosinophils Relative: 4 %
HCT: 35.6 % — ABNORMAL LOW (ref 39.0–52.0)
Hemoglobin: 11.4 g/dL — ABNORMAL LOW (ref 13.0–17.0)
Immature Granulocytes: 0 %
Lymphocytes Relative: 33 %
Lymphs Abs: 1.9 10*3/uL (ref 0.7–4.0)
MCH: 31.5 pg (ref 26.0–34.0)
MCHC: 32 g/dL (ref 30.0–36.0)
MCV: 98.3 fL (ref 80.0–100.0)
Monocytes Absolute: 0.6 10*3/uL (ref 0.1–1.0)
Monocytes Relative: 10 %
Neutro Abs: 3.1 10*3/uL (ref 1.7–7.7)
Neutrophils Relative %: 53 %
Platelets: 164 10*3/uL (ref 150–400)
RBC: 3.62 MIL/uL — ABNORMAL LOW (ref 4.22–5.81)
RDW: 16.5 % — ABNORMAL HIGH (ref 11.5–15.5)
WBC: 5.9 10*3/uL (ref 4.0–10.5)
nRBC: 0 % (ref 0.0–0.2)

## 2020-01-19 LAB — URINALYSIS, ROUTINE W REFLEX MICROSCOPIC
Bilirubin Urine: NEGATIVE
Glucose, UA: NEGATIVE mg/dL
Hgb urine dipstick: NEGATIVE
Ketones, ur: NEGATIVE mg/dL
Leukocytes,Ua: NEGATIVE
Nitrite: NEGATIVE
Protein, ur: NEGATIVE mg/dL
Specific Gravity, Urine: 1.01 (ref 1.005–1.030)
pH: 6 (ref 5.0–8.0)

## 2020-01-19 LAB — RESP PANEL BY RT-PCR (FLU A&B, COVID) ARPGX2
Influenza A by PCR: NEGATIVE
Influenza B by PCR: NEGATIVE
SARS Coronavirus 2 by RT PCR: NEGATIVE

## 2020-01-19 LAB — LIPASE, BLOOD: Lipase: 23 U/L (ref 11–51)

## 2020-01-19 MED ORDER — MECLIZINE HCL 12.5 MG PO TABS: 12.5000 mg | ORAL_TABLET | Freq: Three times a day (TID) | ORAL | 0 refills | Status: DC | PRN

## 2020-01-19 MED ORDER — ALBUTEROL SULFATE HFA 108 (90 BASE) MCG/ACT IN AERS
2.0000 | INHALATION_SPRAY | Freq: Once | RESPIRATORY_TRACT | Status: AC
  Administered 2020-01-19: 2 via RESPIRATORY_TRACT
  Filled 2020-01-19: qty 6.7

## 2020-01-19 MED ORDER — MECLIZINE HCL 25 MG PO TABS
25.0000 mg | ORAL_TABLET | Freq: Once | ORAL | Status: AC
  Administered 2020-01-19: 25 mg via ORAL
  Filled 2020-01-19: qty 1

## 2020-01-19 MED ORDER — ONDANSETRON HCL 4 MG/2ML IJ SOLN
4.0000 mg | Freq: Once | INTRAMUSCULAR | Status: AC
  Administered 2020-01-19: 4 mg via INTRAVENOUS
  Filled 2020-01-19: qty 2

## 2020-01-19 MED ORDER — SODIUM CHLORIDE 0.9 % IV BOLUS
1000.0000 mL | Freq: Once | INTRAVENOUS | Status: AC
  Administered 2020-01-19: 1000 mL via INTRAVENOUS

## 2020-01-19 NOTE — ED Notes (Signed)
Pt was able to tolerate and keep ginger ale down. Provider aware.

## 2020-01-19 NOTE — Discharge Instructions (Addendum)
Stay hydrated. Your CT and labs and xrays were unremarkable.   Your MRI did not show a stroke.  Take meclizine as needed for dizziness and stay hydrated.  See your doctor   Please see neurologist for further follow-up for your trouble walking  Return to ER if you have worse weakness, vomiting, chest pain, trouble breathing.

## 2020-01-19 NOTE — ED Notes (Signed)
Got pt up and assisted him with getting dressed. He advised he was too dizzy and didn't feel good about going home. Provider aware and at bedside.

## 2020-01-19 NOTE — ED Notes (Signed)
Pt back from radiology 

## 2020-01-19 NOTE — ED Notes (Signed)
Patient transported to X-ray 

## 2020-01-19 NOTE — ED Provider Notes (Addendum)
[facility]-EMERGENCY DEPT Provider Note   CSN: [id number] Arrival date & time: 01/19/20  1335     History Chief Complaint  Patient presents with  . Nausea  . Weakness    Chad Duke is a 74 y.o. male hx of AAA, CHF, GERD, here with nausea, weakness, trouble walking.  Patient states that he was doing well yesterday.  He states that he just finished a course of antibiotics for his pneumonia.  He was admitted to [facility] for pneumonia about a month ago.  He states that he woke up today and just felt generalized weakness.  He tries to get up on the commode but was unable to.  Denies any fevers or chills or cough. Denies any trouble speaking or numbness. Denies any chest pain or abdominal pain.   The history is provided by the patient.       Past Medical History:  Diagnosis Date  . AAA (abdominal aortic aneurysm) (HCC)   . Allergy    Rhinitis  . Arthritis    Rhuematoid  . Asthma   . CHF (congestive heart failure) (HCC)    chronic diastolic (congestive) heart failure from notes from [facility] face sheet  . Chronic renal insufficiency   . Depression    per notes from [facility] on chart  . Diverticulosis   . Emphysema   . Falls frequently   . GERD (gastroesophageal reflux disease)   . Hearing loss of both ears   . Hiatal hernia   . Hyperlipidemia   . Hypertension   . Male hypogonadism   . Nephrolithiasis   . Nephrolithiasis   . Sleep apnea    On CPAP  . Stasis dermatitis   . Varicose veins   . Vitamin B 12 deficiency     Patient Active Problem List   Diagnosis Date Noted  . Chronic renal disease, stage III (HCC) 01/25/2018  . Weakness 10/03/2017  . Adult failure to thrive 10/03/2017  . Positive D dimer 09/26/2017  . Elevated troponin 09/26/2017  . Personal history of DVT (deep vein thrombosis) 09/26/2017  . COPD with acute exacerbation (HCC) 09/26/2017  . CAD (coronary artery disease) 09/08/2017  . Goals of care,  counseling/discussion   . Palliative care by specialist   . Acute kidney injury superimposed on chronic kidney disease (HCC) 09/02/2017  . Hypokalemia 09/02/2017  . Hypomagnesemia 09/02/2017  . Controlled type 2 diabetes mellitus with hyperglycemia (HCC) 09/02/2017  . Urinary incontinence 09/02/2017  . Fall 08/13/2017  . Acute renal failure superimposed on stage 3 chronic kidney disease (HCC) 08/13/2017  . HLD (hyperlipidemia) 08/13/2017  . Essential hypertension 08/13/2017  . GERD (gastroesophageal reflux disease) 08/13/2017  . Orthostatic hypotension 08/13/2017  . Deep vein thrombosis (DVT) of both lower extremities (HCC)   . Hyperglycemia   . History of pulmonary embolism 07/20/2017  . Acute on chronic diastolic heart failure (HCC) 07/20/2017  . Bilateral leg edema 07/20/2017  . Acute exacerbation of chronic obstructive pulmonary disease (COPD) (HCC) 07/19/2017  . 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 Cigarette smoker 09/08/2016  . Morbid obesity due to excess calories (HCC) 09/08/2016  . Pain in joint, ankle and foot 03/31/2012  . Abnormality of gait 03/31/2012  . Rheumatoid arthritis (HCC) 03/31/2012  . COPD GOLD III  02/12/2011  . Sleep apnea 02/12/2011    Past Surgical History:  Procedure Laterality Date  . CARPAL TUNNEL RELEASE     bilateral  . CYSTOSCOPY/URETEROSCOPY/HOLMIUM LASER/STENT PLACEMENT Left 11/02/2018   Procedure: CYSTOSCOPY/URETEROSCOPY/HOLMIUM  LASER/STENT PLACEMENT;  Surgeon: Crista Elliot, MD;  Location: [facility] ORS;  Service: Urology;  Laterality: Left;  . FRACTURE SURGERY Left    Fibula  . JOINT REPLACEMENT Left    Shoulder  . JOINT REPLACEMENT Left    Hip   . KIDNEY STONE SURGERY  2003  . left hip replacement    . left knee    . left shoulder    . LITHOTRIPSY         Family History  Problem Relation Age of Onset  . Emphysema Father   . Asthma Father   . Osteoarthritis Father   . Heart attack Mother   . Uterine cancer Mother   .  Heart disease Mother   . ADD / ADHD Son   . Depression Son   . Hypertension Brother   . Heart disease Brother        Pacemaker   . Heart murmur Brother     Social History   Tobacco Use  . Smoking status: Current Every Day Smoker    Packs/day: 2.00    Years: 53.00    Pack years: 106.00    Types: Cigarettes  . Smokeless tobacco: Never Used  . Tobacco comment: currently smoking 1ppd--01/09/2020  Vaping Use  . Vaping Use: Never used  Substance Use Topics  . Alcohol use: Not Currently    Alcohol/week: 0.0 standard drinks  . Drug use: No    Home Medications Prior to Admission medications   Medication Sig Start Date End Date Taking? Authorizing Provider  albuterol (PROVENTIL HFA;VENTOLIN HFA) 108 (90 Base) MCG/ACT inhaler Inhale 2 puffs into the lungs every 4 (four) hours as needed for wheezing or shortness of breath. 07/30/17   Nyoka Cowden, MD  allopurinol (ZYLOPRIM) 100 MG tablet Take 100 mg by mouth daily.    [provider]  apixaban (ELIQUIS) 5 MG TABS tablet Take 1 tablet (5 mg total) by mouth 2 (two) times daily. 09/04/17   Albertine Grates, MD  atorvastatin (LIPITOR) 40 MG tablet Take 1 tablet (40 mg total) by mouth daily. Patient taking differently: Take 40 mg by mouth at bedtime.  05/18/17 10/03/25  Lewayne Bunting, MD  budesonide (PULMICORT) 0.25 MG/2ML nebulizer solution Take 0.25 mg by nebulization 2 (two) times daily.    [provider]  buPROPion (ZYBAN) 150 MG 12 hr tablet Take 1 tablet (150 mg total) by mouth 2 (two) times daily. 01/09/20   Coralyn Helling, MD  CALCIUM-MAGNESIUM-VITAMIN D PO Take 1 tablet by mouth daily.    [provider]  furosemide (LASIX) 40 MG tablet Take 40 mg by mouth daily.    [provider]  inFLIXimab (REMICADE) 100 MG injection Inject into the vein every 6 (six) weeks.    [provider]  ipratropium-albuterol (DUONEB) 0.5-2.5 (3) MG/3ML SOLN Take 3 mLs by nebulization 3 (three) times daily.      [provider]  Magnesium 400 MG CAPS Take 400 mg by mouth at bedtime.     [provider]  metFORMIN (GLUCOPHAGE) 500 MG tablet Take 500 mg by mouth 2 (two) times daily. 05/09/19   [provider]  pantoprazole (PROTONIX) 40 MG tablet Take 40 mg by mouth daily.     [provider]  potassium chloride SA (K-DUR,KLOR-CON) 20 MEQ tablet Take 40 mEq by mouth daily.    [provider]  predniSONE (DELTASONE) 20 MG tablet prednisone 20 mg tablet  TAKE 3 TABLETS EACH AM X4 DAYS.2 TABS  EACH AM X4 DAYS.1 TABLET EACH AM X4 DAYS    [provider]  sodium chloride (OCEAN) 0.65 % SOLN nasal spray Place 1 spray into both nostrils See admin instructions. Use 1 spray in each nostril nightly, may use 1 spray in each nostril every 1 hour as needed for sinus congestion    [provider]  vitamin C (ASCORBIC ACID) 500 MG tablet Take 1,000 mg by mouth at bedtime.    [provider]    Allergies    Varenicline  Review of Systems   Review of Systems  Neurological: Positive for weakness.  All other systems reviewed and are negative.   Physical Exam Updated Vital Signs BP 131/83   Pulse 93   Temp 97.9 F (36.6 C) (Oral)   Resp (!) 24   Ht 5\' 7"  (1.702 m)   Wt 99.8 kg   SpO2 92%   BMI 34.46 kg/m   Physical Exam Vitals and nursing note reviewed.  Constitutional:      Comments: Chronically ill   HENT:     Head: Normocephalic.     Nose: Nose normal.     Mouth/Throat:     Mouth: Mucous membranes are dry.  Eyes:     Extraocular Movements: Extraocular movements intact.     Pupils: Pupils are equal, round, and reactive to light.  Cardiovascular:     Rate and Rhythm: Normal rate and regular rhythm.     Pulses: Normal pulses.     Heart sounds: Normal heart sounds.  Pulmonary:     Effort: Pulmonary effort is normal.     Comments: Mild diffuse wheezing, no retraction Abdominal:     General: Abdomen is flat.     Palpations:  Abdomen is soft.  Musculoskeletal:        General: Normal range of motion.     Cervical back: Normal range of motion and neck supple.  Skin:    General: Skin is warm.     Capillary Refill: Capillary refill takes less than 2 seconds.  Neurological:     General: No focal deficit present.     Mental Status: He is oriented to person, place, and time.     Comments: CN 2- 12 intact, strength 4/5 bilaterally, nl sensation throughout      ED Results / Procedures / Treatments   Labs (all labs ordered are listed, but only abnormal results are displayed) Labs Reviewed  CBC WITH DIFFERENTIAL/PLATELET - Abnormal; Notable for the following components:      Result Value   RBC 3.62 (*)    Hemoglobin 11.4 (*)    HCT 35.6 (*)    RDW 16.5 (*)    All other components within normal limits  COMPREHENSIVE METABOLIC PANEL - Abnormal; Notable for the following components:   BUN 24 (*)    Creatinine, Ser 1.57 (*)    Albumin 3.4 (*)    GFR, Estimated 46 (*)    All other components within normal limits  BLOOD GAS, VENOUS - Abnormal; Notable for the following components:   pCO2, Ven 43.7 (*)    pO2, Ven 54.2 (*)    Acid-Base Excess 2.8 (*)    All other components within normal limits  RESP PANEL BY RT-PCR (FLU A&B, COVID) ARPGX2  URINE CULTURE  LIPASE, BLOOD  URINALYSIS, ROUTINE W REFLEX MICROSCOPIC  I-STAT VENOUS BLOOD GAS, ED  TROPONIN I (HIGH SENSITIVITY)  TROPONIN I (HIGH SENSITIVITY)    EKG EKG Interpretation  Date/Time:  Friday January 19 2020 13:52:17 EST Ventricular Rate:  90 PR Interval:    QRS Duration: 140 QT Interval:  412 QTC Calculation: 505 R Axis:   -87 Text Interpretation: Sinus rhythm Right bundle branch block No significant change since last tracing Confirmed by Richardean Canal [phone]) on 01/19/2020 2:16:40 PM   Radiology DG Chest 2 View  Result Date: 01/19/2020 CLINICAL DATA:  Nausea.  Weakness.  Recent pneumonia. EXAM: CHEST - 2 VIEW COMPARISON:  01/09/2020  FINDINGS: Hyperinflation. Midline trachea. Normal heart size. Numerous leads and wires project over the chest. Left costophrenic angle blunting is similar, favored to be due to pleural thickening. No fluid on the lateral view. The inferior right middle lobe airspace disease is decreased, only apparent on the lateral view. Left lower lobe airspace disease versus overlying soft tissues on the frontal radiograph. IMPRESSION: Improved right middle lobe aeration. Hyperinflation, without definite acute superimposed process. Left base airspace disease on the frontal versus summation of overlying soft tissues. Electronically Signed   By: Jeronimo Greaves M.D.   On: 01/19/2020 15:24   CT Head Wo Contrast  Result Date: 01/19/2020 CLINICAL DATA:  Dizziness. EXAM: CT HEAD WITHOUT CONTRAST TECHNIQUE: Contiguous axial images were obtained from the base of the skull through the vertex without intravenous contrast. COMPARISON:  None. FINDINGS: Brain: No evidence of acute large vascular territory infarction, hemorrhage, hydrocephalus, extra-axial collection or mass lesion/mass effect. Patchy white matter hypodensities, most likely related to chronic microvascular ischemic disease. Generalized cerebral atrophy with ex vacuo ventricular dilation. Vascular: Calcific atherosclerosis. Skull: Calcific atherosclerosis. Sinuses/Orbits: Visualized sinuses are clear. Other: No mastoid effusions. IMPRESSION: 1. No evidence of acute intracranial abnormality. 2. Generalized cerebral volume loss and chronic microvascular ischemic disease. Electronically Signed   By: Feliberto Harts MD   On: 01/19/2020 15:04    Procedures Procedures (including critical care time)  Medications Ordered in ED Medications  sodium chloride 0.9 % bolus 1,000 mL (1,000 mLs Intravenous New Bag/Given 01/19/20 1427)  ondansetron (ZOFRAN) injection 4 mg (4 mg Intravenous Given 01/19/20 1426)  albuterol (VENTOLIN HFA) 108 (90 Base) MCG/ACT inhaler 2 puff (2 puffs  Inhalation Given 01/19/20 1427)    ED Course  I have reviewed the triage vital signs and the nursing notes.  Pertinent labs & imaging results that were available during my care of the patient were reviewed by me and considered in my medical decision making (see chart for details).    MDM Rules/Calculators/A&P                         Chad Duke is a 74 y.o. male who presenting with weakness.  Patient has generalized weakness that started today.  There is no obvious focal weakness to suggest a stroke.  Patient was recently admitted to the hospital for COPD and pneumonia.  He has some mild wheezing currently but has no shortness of breath and states that he has chronic wheezing.  I wonder if he has COPD versus pneumonia versus UTI.  Low suspicion for stroke.  Plan to get CBC, CMP, urinalysis, chest x-ray. If CT head showed no bleed, will hold off on MRI right now.   3:48 PM Labs unremarkable.  CT head and chest x-ray unremarkable.  Stable for discharge.  He felt better after IV fluids and tolerated p.o. I think likely mild dehydration.  Stable for discharge right now  7:09 PM Right after discharge, patient states that he still has trouble walking.  I observed patient walk  and he has wide-based gait.  MRI brain ordered and there is no stroke. Unclear why he has some wide-based gait.  Will give meclizine as needed for dizziness and told him to stay hydrated.  Will have him follow-up with neurology outpatient.   Final Clinical Impression(s) / ED Diagnoses Final diagnoses:  None    Rx / DC Orders ED Discharge Orders    None       Charlynne Pander, MD 01/19/20 1550    Charlynne Pander, MD 01/19/20 1910

## 2020-01-19 NOTE — ED Notes (Signed)
Pt. Documented in error see above note in chart. 

## 2020-01-19 NOTE — ED Notes (Signed)
Went into pts room to medicated him and restart his IV. Found pt walking around the room. Instructed pt to sit back in bed since he stated he was dizzy and unable to walk.

## 2020-01-19 NOTE — ED Triage Notes (Signed)
BIB by EMS with c/o nausea and weakness since yesterday.  Recently diagnosed with pnuemonia and just finished oral abx

## 2020-01-19 NOTE — ED Notes (Signed)
Fall Bundle: Yellow socks Bed alarm (on) Fall Risk armband Fall sign outside of door

## 2020-01-19 NOTE — ED Notes (Signed)
Patient off the floor for MRI.

## 2020-01-21 ENCOUNTER — Emergency Department (HOSPITAL_COMMUNITY)
Admission: EM | Admit: 2020-01-21 | Discharge: 2020-01-23 | Disposition: A | Discharge status: Home / Self Care | Payer: Medicare Other | Attending: Emergency Medicine | Admitting: Emergency Medicine

## 2020-01-21 ENCOUNTER — Encounter (HOSPITAL_COMMUNITY): Payer: Self-pay | Admitting: Emergency Medicine

## 2020-01-21 ENCOUNTER — Emergency Department (HOSPITAL_COMMUNITY): Payer: Medicare Other

## 2020-01-21 DIAGNOSIS — Z20822 Contact with and (suspected) exposure to covid-19: Secondary | ICD-10-CM | POA: Diagnosis not present

## 2020-01-21 DIAGNOSIS — J441 Chronic obstructive pulmonary disease with (acute) exacerbation: Secondary | ICD-10-CM | POA: Insufficient documentation

## 2020-01-21 DIAGNOSIS — Z96612 Presence of left artificial shoulder joint: Secondary | ICD-10-CM | POA: Insufficient documentation

## 2020-01-21 DIAGNOSIS — I2510 Atherosclerotic heart disease of native coronary artery without angina pectoris: Secondary | ICD-10-CM | POA: Diagnosis not present

## 2020-01-21 DIAGNOSIS — Z79899 Other long term (current) drug therapy: Secondary | ICD-10-CM | POA: Insufficient documentation

## 2020-01-21 DIAGNOSIS — S0990XA Unspecified injury of head, initial encounter: Secondary | ICD-10-CM | POA: Insufficient documentation

## 2020-01-21 DIAGNOSIS — F17210 Nicotine dependence, cigarettes, uncomplicated: Secondary | ICD-10-CM | POA: Diagnosis not present

## 2020-01-21 DIAGNOSIS — E1122 Type 2 diabetes mellitus with diabetic chronic kidney disease: Secondary | ICD-10-CM | POA: Insufficient documentation

## 2020-01-21 DIAGNOSIS — I130 Hypertensive heart and chronic kidney disease with heart failure and stage 1 through stage 4 chronic kidney disease, or unspecified chronic kidney disease: Secondary | ICD-10-CM | POA: Diagnosis not present

## 2020-01-21 DIAGNOSIS — R531 Weakness: Secondary | ICD-10-CM | POA: Diagnosis not present

## 2020-01-21 DIAGNOSIS — I5033 Acute on chronic diastolic (congestive) heart failure: Secondary | ICD-10-CM | POA: Diagnosis not present

## 2020-01-21 DIAGNOSIS — Z96642 Presence of left artificial hip joint: Secondary | ICD-10-CM | POA: Insufficient documentation

## 2020-01-21 DIAGNOSIS — Z7984 Long term (current) use of oral hypoglycemic drugs: Secondary | ICD-10-CM | POA: Diagnosis not present

## 2020-01-21 DIAGNOSIS — Z7901 Long term (current) use of anticoagulants: Secondary | ICD-10-CM | POA: Diagnosis not present

## 2020-01-21 DIAGNOSIS — Y92000 Kitchen of unspecified non-institutional (private) residence as  the place of occurrence of the external cause: Secondary | ICD-10-CM | POA: Insufficient documentation

## 2020-01-21 DIAGNOSIS — W19XXXA Unspecified fall, initial encounter: Secondary | ICD-10-CM | POA: Insufficient documentation

## 2020-01-21 DIAGNOSIS — N183 Chronic kidney disease, stage 3 unspecified: Secondary | ICD-10-CM | POA: Diagnosis not present

## 2020-01-21 LAB — COMPREHENSIVE METABOLIC PANEL
ALT: 20 U/L (ref 0–44)
AST: 27 U/L (ref 15–41)
Albumin: 3.2 g/dL — ABNORMAL LOW (ref 3.5–5.0)
Alkaline Phosphatase: 61 U/L (ref 38–126)
Anion gap: 10 (ref 5–15)
BUN: 16 mg/dL (ref 8–23)
CO2: 28 mmol/L (ref 22–32)
Calcium: 9.1 mg/dL (ref 8.9–10.3)
Chloride: 100 mmol/L (ref 98–111)
Creatinine, Ser: 1.61 mg/dL — ABNORMAL HIGH (ref 0.61–1.24)
GFR, Estimated: 45 mL/min — ABNORMAL LOW (ref >60)
Glucose, Bld: 79 mg/dL (ref 70–99)
Potassium: 3.9 mmol/L (ref 3.5–5.1)
Sodium: 138 mmol/L (ref 135–145)
Total Bilirubin: 0.8 mg/dL (ref 0.3–1.2)
Total Protein: 6.7 g/dL (ref 6.5–8.1)

## 2020-01-21 LAB — CBC WITH DIFFERENTIAL/PLATELET
Abs Immature Granulocytes: 0.01 10*3/uL (ref 0.00–0.07)
Basophils Absolute: 0 10*3/uL (ref 0.0–0.1)
Basophils Relative: 1 %
Eosinophils Absolute: 0.2 10*3/uL (ref 0.0–0.5)
Eosinophils Relative: 3 %
HCT: 39.1 % (ref 39.0–52.0)
Hemoglobin: 12.2 g/dL — ABNORMAL LOW (ref 13.0–17.0)
Immature Granulocytes: 0 %
Lymphocytes Relative: 30 %
Lymphs Abs: 1.6 10*3/uL (ref 0.7–4.0)
MCH: 30.8 pg (ref 26.0–34.0)
MCHC: 31.2 g/dL (ref 30.0–36.0)
MCV: 98.7 fL (ref 80.0–100.0)
Monocytes Absolute: 0.5 10*3/uL (ref 0.1–1.0)
Monocytes Relative: 10 %
Neutro Abs: 3 10*3/uL (ref 1.7–7.7)
Neutrophils Relative %: 56 %
Platelets: 153 10*3/uL (ref 150–400)
RBC: 3.96 MIL/uL — ABNORMAL LOW (ref 4.22–5.81)
RDW: 16.3 % — ABNORMAL HIGH (ref 11.5–15.5)
WBC: 5.2 10*3/uL (ref 4.0–10.5)
nRBC: 0 % (ref 0.0–0.2)

## 2020-01-21 LAB — TROPONIN I (HIGH SENSITIVITY): Troponin I (High Sensitivity): 13 ng/L (ref <18)

## 2020-01-21 LAB — URINE CULTURE: Culture: NO GROWTH

## 2020-01-21 MED ORDER — SODIUM CHLORIDE 0.9 % IV BOLUS
1000.0000 mL | Freq: Once | INTRAVENOUS | Status: AC
  Administered 2020-01-22: 1000 mL via INTRAVENOUS

## 2020-01-21 NOTE — ED Triage Notes (Signed)
Pt transported from home by [facility] after fall, pt seen yesterday at [facility] for same, pt states he became weak, dizzy and fell into kitchen floor, uses walker at home. Pt states dizziness has been happening for weeks, placed on meclizine for treatment. Denies LOC, pt does take Eliquis, no head trauma although he does state he may have hit head on floor.

## 2020-01-21 NOTE — ED Provider Notes (Signed)
[facility] EMERGENCY DEPARTMENT Provider Note   CSN: [id number] Arrival date & time: 01/21/20  2104     History Chief Complaint  Patient presents with  . Fall    Chad Duke is a 74 y.o. male.  HPI 74 year old male presents with generalized weakness and fall.  The patient has been feeling weakness in his "quarter" for about 2 weeks.  He is also had dizziness.  Went to [facility] a couple days ago where he was evaluated including MRI.  Today he has not had much to eat due to trouble getting around.  His whole body started to fall and he could do nothing to stop it today and then he did have a minor head injury.  He states it was not a syncopal event.  No headache, chest pain, abdominal pain or back pain.  No focal weakness.   Past Medical History:  Diagnosis Date  . AAA (abdominal aortic aneurysm) (HCC)   . Allergy    Rhinitis  . Arthritis    Rhuematoid  . Asthma   . CHF (congestive heart failure) (HCC)    chronic diastolic (congestive) heart failure from notes from [facility] face sheet  . Chronic renal insufficiency   . Depression    per notes from [facility] on chart  . Diverticulosis   . Emphysema   . Falls frequently   . GERD (gastroesophageal reflux disease)   . Hearing loss of both ears   . Hiatal hernia   . Hyperlipidemia   . Hypertension   . Male hypogonadism   . Nephrolithiasis   . Nephrolithiasis   . Sleep apnea    On CPAP  . Stasis dermatitis   . Varicose veins   . Vitamin B 12 deficiency     Patient Active Problem List   Diagnosis Date Noted  . Chronic renal disease, stage III (HCC) 01/25/2018  . Weakness 10/03/2017  . Adult failure to thrive 10/03/2017  . Positive D dimer 09/26/2017  . Elevated troponin 09/26/2017  . Personal history of DVT (deep vein thrombosis) 09/26/2017  . COPD with acute exacerbation (HCC) 09/26/2017  . CAD (coronary artery disease) 09/08/2017  . Goals of care, counseling/discussion   .  Palliative care by specialist   . Acute kidney injury superimposed on chronic kidney disease (HCC) 09/02/2017  . Hypokalemia 09/02/2017  . Hypomagnesemia 09/02/2017  . Controlled type 2 diabetes mellitus with hyperglycemia (HCC) 09/02/2017  . Urinary incontinence 09/02/2017  . Fall 08/13/2017  . Acute renal failure superimposed on stage 3 chronic kidney disease (HCC) 08/13/2017  . HLD (hyperlipidemia) 08/13/2017  . Essential hypertension 08/13/2017  . GERD (gastroesophageal reflux disease) 08/13/2017  . Orthostatic hypotension 08/13/2017  . Deep vein thrombosis (DVT) of both lower extremities (HCC)   . Hyperglycemia   . History of pulmonary embolism 07/20/2017  . Acute on chronic diastolic heart failure (HCC) 07/20/2017  . Bilateral leg edema 07/20/2017  . Acute exacerbation of chronic obstructive pulmonary disease (COPD) (HCC) 07/19/2017  . 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 Cigarette smoker 09/08/2016  . Morbid obesity due to excess calories (HCC) 09/08/2016  . Pain in joint, ankle and foot 03/31/2012  . Abnormality of gait 03/31/2012  . Rheumatoid arthritis (HCC) 03/31/2012  . COPD GOLD III  02/12/2011  . Sleep apnea 02/12/2011    Past Surgical History:  Procedure Laterality Date  . CARPAL TUNNEL RELEASE     bilateral  . CYSTOSCOPY/URETEROSCOPY/HOLMIUM LASER/STENT PLACEMENT Left 11/02/2018   Procedure: CYSTOSCOPY/URETEROSCOPY/HOLMIUM LASER/STENT  PLACEMENT;  Surgeon: Crista Elliot, MD;  Location: [facility];  Service: Urology;  Laterality: Left;  . FRACTURE SURGERY Left    Fibula  . JOINT REPLACEMENT Left    Shoulder  . JOINT REPLACEMENT Left    Hip   . KIDNEY STONE SURGERY  2003  . left hip replacement    . left knee    . left shoulder    . LITHOTRIPSY         Family History  Problem Relation Age of Onset  . Emphysema Father   . Asthma Father   . Osteoarthritis Father   . Heart attack Mother   . Uterine cancer Mother   . Heart disease Mother   . ADD /  ADHD Son   . Depression Son   . Hypertension Brother   . Heart disease Brother        Pacemaker   . Heart murmur Brother     Social History   Tobacco Use  . Smoking status: Current Every Day Smoker    Packs/day: 2.00    Years: 53.00    Pack years: 106.00    Types: Cigarettes  . Smokeless tobacco: Never Used  . Tobacco comment: currently smoking 1ppd--01/09/2020  Vaping Use  . Vaping Use: Never used  Substance Use Topics  . Alcohol use: Not Currently    Alcohol/week: 0.0 standard drinks  . Drug use: No    Home Medications Prior to Admission medications   Medication Sig Start Date End Date Taking? Authorizing Provider  albuterol (PROVENTIL HFA;VENTOLIN HFA) 108 (90 Base) MCG/ACT inhaler Inhale 2 puffs into the lungs every 4 (four) hours as needed for wheezing or shortness of breath. 07/30/17   Nyoka Cowden, MD  allopurinol (ZYLOPRIM) 100 MG tablet Take 100 mg by mouth daily.    [provider]  apixaban (ELIQUIS) 5 MG TABS tablet Take 1 tablet (5 mg total) by mouth 2 (two) times daily. 09/04/17   Albertine Grates, MD  atorvastatin (LIPITOR) 40 MG tablet Take 1 tablet (40 mg total) by mouth daily. Patient taking differently: Take 40 mg by mouth at bedtime.  05/18/17 10/03/25  Lewayne Bunting, MD  budesonide (PULMICORT) 0.25 MG/2ML nebulizer solution Take 0.25 mg by nebulization 2 (two) times daily.    [provider]  buPROPion (ZYBAN) 150 MG 12 hr tablet Take 1 tablet (150 mg total) by mouth 2 (two) times daily. 01/09/20   Coralyn Helling, MD  CALCIUM-MAGNESIUM-VITAMIN D PO Take 1 tablet by mouth daily.    [provider]  furosemide (LASIX) 40 MG tablet Take 40 mg by mouth daily.    [provider]  inFLIXimab (REMICADE) 100 MG injection Inject into the vein every 6 (six) weeks.    [provider]  ipratropium-albuterol (DUONEB) 0.5-2.5 (3) MG/3ML SOLN Take 3 mLs by nebulization 3 (three) times daily.     [provider]  Magnesium 400  MG CAPS Take 400 mg by mouth at bedtime.     [provider]  meclizine (ANTIVERT) 12.5 MG tablet Take 1 tablet (12.5 mg total) by mouth 3 (three) times daily as needed for dizziness. 01/19/20   Charlynne Pander, MD  metFORMIN (GLUCOPHAGE) 500 MG tablet Take 500 mg by mouth 2 (two) times daily. 05/09/19   [provider]  pantoprazole (PROTONIX) 40 MG tablet Take 40 mg by mouth daily.     [provider]  potassium chloride SA (K-DUR,KLOR-CON) 20 MEQ tablet Take 40 mEq  by mouth daily.    [provider]  predniSONE (DELTASONE) 20 MG tablet prednisone 20 mg tablet  TAKE 3 TABLETS EACH AM X4 DAYS.2 TABS EACH AM X4 DAYS.1 TABLET EACH AM X4 DAYS    [provider]  sodium chloride (OCEAN) 0.65 % SOLN nasal spray Place 1 spray into both nostrils See admin instructions. Use 1 spray in each nostril nightly, may use 1 spray in each nostril every 1 hour as needed for sinus congestion    [provider]  vitamin C (ASCORBIC ACID) 500 MG tablet Take 1,000 mg by mouth at bedtime.    [provider]    Allergies    Varenicline  Review of Systems   Review of Systems  Constitutional: Negative for fever.  Respiratory: Negative for shortness of breath.   Cardiovascular: Negative for chest pain.  Gastrointestinal: Negative for abdominal pain and vomiting.  Musculoskeletal: Negative for back pain.  Neurological: Positive for dizziness and weakness. Negative for headaches.  All other systems reviewed and are negative.   Physical Exam Updated Vital Signs BP 137/78   Pulse 91   Temp 98.1 F (36.7 C) (Temporal)   Resp 19   Ht 5\' 7"  (1.702 m)   Wt 99 kg   SpO2 96%   BMI 34.18 kg/m   Physical Exam Vitals and nursing note reviewed.  Constitutional:      General: He is not in acute distress.    Appearance: He is well-developed and well-nourished. He is not ill-appearing or diaphoretic.  HENT:     Head: Normocephalic and atraumatic.      Right Ear: External ear normal.     Left Ear: External ear normal.     Nose: Nose normal.  Eyes:     General:        Right eye: No discharge.        Left eye: No discharge.     Extraocular Movements: Extraocular movements intact.     Pupils: Pupils are equal, round, and reactive to light.  Cardiovascular:     Rate and Rhythm: Normal rate and regular rhythm.     Heart sounds: Normal heart sounds.  Pulmonary:     Effort: Pulmonary effort is normal.     Breath sounds: Normal breath sounds.  Abdominal:     General: There is no distension.     Palpations: Abdomen is soft.     Tenderness: There is no abdominal tenderness.  Musculoskeletal:        General: No edema.     Cervical back: Neck supple.  Skin:    General: Skin is warm and dry.  Neurological:     Mental Status: He is alert and oriented to person, place, and time.     Comments: CN 3-12 grossly intact. 5/5 strength in all 4 extremities. Grossly normal sensation. Normal finger to nose, though with mild tremor  Psychiatric:        Mood and Affect: Mood is not anxious.     ED Results / Procedures / Treatments   Labs (all labs ordered are listed, but only abnormal results are displayed) Labs Reviewed  COMPREHENSIVE METABOLIC PANEL - Abnormal; Notable for the following components:      Result Value   Creatinine, Ser 1.61 (*)    Albumin 3.2 (*)    GFR, Estimated 45 (*)    All other components within normal limits  CBC WITH DIFFERENTIAL/PLATELET - Abnormal; Notable for the following components:   RBC 3.96 (*)  Hemoglobin 12.2 (*)    RDW 16.3 (*)    All other components within normal limits  URINALYSIS, ROUTINE W REFLEX MICROSCOPIC  CBG MONITORING, ED  TROPONIN I (HIGH SENSITIVITY)  TROPONIN I (HIGH SENSITIVITY)    EKG EKG Interpretation  Date/Time:  Sunday January 21 2020 21:13:31 EST Ventricular Rate:  91 PR Interval:    QRS Duration: 159 QT Interval:  407 QTC Calculation: 501 R Axis:   -79 Text  Interpretation: Sinus rhythm RBBB and LAFB no significant change since Jan 19 2020 Confirmed by Pricilla Loveless [phone]) on 01/21/2020 9:16:20 PM   Radiology DG Chest 2 View  Result Date: 01/21/2020 CLINICAL DATA:  Cough EXAM: CHEST - 2 VIEW COMPARISON:  01/19/2020 FINDINGS: Lungs are well expanded, symmetric, and clear. No pneumothorax or pleural effusion. Cardiac size within normal limits. Pulmonary vascularity is normal. Osseous structures are age-appropriate. No acute bone abnormality. Left total shoulder arthroplasty has been performed IMPRESSION: No active cardiopulmonary disease. Electronically Signed   By: Helyn Numbers MD   On: 01/21/2020 22:09   CT Head Wo Contrast  Result Date: 01/21/2020 CLINICAL DATA:  Head injury, fall, dizziness EXAM: CT HEAD WITHOUT CONTRAST TECHNIQUE: Contiguous axial images were obtained from the base of the skull through the vertex without intravenous contrast. COMPARISON:  None. FINDINGS: Brain: Normal anatomic configuration. Parenchymal volume loss is commensurate with the patient's age. Mild periventricular white matter changes are present likely reflecting the sequela of small vessel ischemia. No abnormal intra or extra-axial mass lesion or fluid collection. No abnormal mass effect or midline shift. No evidence of acute intracranial hemorrhage or infarct. Ventricular size is normal. Cerebellum unremarkable. Vascular: No asymmetric hyperdense vasculature at the skull base. Skull: Intact Sinuses/Orbits: Paranasal sinuses are clear. Orbits are unremarkable. Other: Mastoid air cells and middle ear cavities are clear. IMPRESSION: No acute intracranial injury.  No calvarial fracture. Electronically Signed   By: Helyn Numbers MD   On: 01/21/2020 22:49    Procedures Procedures (including critical care time)  Medications Ordered in ED Medications  sodium chloride 0.9 % bolus 1,000 mL (has no administration in time range)    ED Course  I have reviewed the triage  vital signs and the nursing notes.  Pertinent labs & imaging results that were available during my care of the patient were reviewed by me and considered in my medical decision making (see chart for details).    MDM Rules/Calculators/A&P                          Patient presents after a fall.  Feels generally weak.  No focal weakness noted.  Head CT is benign.  Just had a similar work-up a couple days ago.  If labs and urine are reassuring and he can get up and ambulate I think he can be discharged home for neurology follow-up as an outpatient. Care to Dr. Judd Lien.  Final Clinical Impression(s) / ED Diagnoses Final diagnoses:  None    Rx / DC Orders ED Discharge Orders    None       Pricilla Loveless, MD 01/21/20 2332

## 2020-01-22 ENCOUNTER — Other Ambulatory Visit: Payer: Self-pay

## 2020-01-22 DIAGNOSIS — S0990XA Unspecified injury of head, initial encounter: Secondary | ICD-10-CM | POA: Diagnosis not present

## 2020-01-22 LAB — URINALYSIS, ROUTINE W REFLEX MICROSCOPIC
Bilirubin Urine: NEGATIVE
Glucose, UA: NEGATIVE mg/dL
Hgb urine dipstick: NEGATIVE
Ketones, ur: 5 mg/dL — AB
Leukocytes,Ua: NEGATIVE
Nitrite: NEGATIVE
Protein, ur: NEGATIVE mg/dL
Specific Gravity, Urine: 1.014 (ref 1.005–1.030)
pH: 6 (ref 5.0–8.0)

## 2020-01-22 LAB — RESP PANEL BY RT-PCR (FLU A&B, COVID) ARPGX2
Influenza A by PCR: NEGATIVE
Influenza B by PCR: NEGATIVE
SARS Coronavirus 2 by RT PCR: NEGATIVE

## 2020-01-22 LAB — TROPONIN I (HIGH SENSITIVITY): Troponin I (High Sensitivity): 13 ng/L (ref <18)

## 2020-01-22 MED ORDER — FUROSEMIDE 20 MG PO TABS
40.0000 mg | ORAL_TABLET | Freq: Every day | ORAL | Status: DC
  Administered 2020-01-23: 40 mg via ORAL
  Filled 2020-01-22: qty 2

## 2020-01-22 MED ORDER — METFORMIN HCL 500 MG PO TABS: 500.0000 mg | ORAL_TABLET | Freq: Two times a day (BID) | ORAL | Status: DC

## 2020-01-22 MED ORDER — POTASSIUM CHLORIDE CRYS ER 20 MEQ PO TBCR
40.0000 meq | EXTENDED_RELEASE_TABLET | Freq: Every day | ORAL | Status: DC
  Administered 2020-01-23: 40 meq via ORAL
  Filled 2020-01-22: qty 2

## 2020-01-22 MED ORDER — IPRATROPIUM-ALBUTEROL 0.5-2.5 (3) MG/3ML IN SOLN
3.0000 mL | Freq: Four times a day (QID) | RESPIRATORY_TRACT | Status: DC
  Administered 2020-01-23: 3 mL via RESPIRATORY_TRACT
  Filled 2020-01-22: qty 3

## 2020-01-22 MED ORDER — BUPROPION HCL ER (SR) 150 MG PO TB12
150.0000 mg | ORAL_TABLET | Freq: Two times a day (BID) | ORAL | Status: DC
  Administered 2020-01-23 (×2): 150 mg via ORAL
  Filled 2020-01-22 (×2): qty 1

## 2020-01-22 MED ORDER — BUDESONIDE 0.25 MG/2ML IN SUSP
0.2500 mg | Freq: Two times a day (BID) | RESPIRATORY_TRACT | Status: DC
  Administered 2020-01-23 (×2): 0.25 mg via RESPIRATORY_TRACT
  Filled 2020-01-22 (×2): qty 2

## 2020-01-22 MED ORDER — ATORVASTATIN CALCIUM 40 MG PO TABS
40.0000 mg | ORAL_TABLET | Freq: Every day | ORAL | Status: DC
  Administered 2020-01-23: 40 mg via ORAL
  Filled 2020-01-22: qty 1

## 2020-01-22 MED ORDER — PANTOPRAZOLE SODIUM 40 MG PO TBEC
40.0000 mg | DELAYED_RELEASE_TABLET | Freq: Every day | ORAL | Status: DC
  Administered 2020-01-23: 40 mg via ORAL
  Filled 2020-01-22: qty 1

## 2020-01-22 MED ORDER — ALLOPURINOL 100 MG PO TABS
100.0000 mg | ORAL_TABLET | Freq: Every day | ORAL | Status: DC
  Administered 2020-01-23: 100 mg via ORAL
  Filled 2020-01-22: qty 1

## 2020-01-22 MED ORDER — ALBUTEROL SULFATE HFA 108 (90 BASE) MCG/ACT IN AERS: 2.0000 | INHALATION_SPRAY | RESPIRATORY_TRACT | Status: DC | PRN

## 2020-01-22 MED ORDER — METFORMIN HCL 500 MG PO TABS
500.0000 mg | ORAL_TABLET | Freq: Two times a day (BID) | ORAL | Status: DC
  Administered 2020-01-23: 500 mg via ORAL
  Filled 2020-01-22: qty 1

## 2020-01-22 NOTE — Discharge Instructions (Addendum)
Continue medications as previously prescribed.  Follow-up with your primary doctor later this week, and return to the emergency department if you develop any new and/or concerning symptoms.

## 2020-01-22 NOTE — ED Notes (Signed)
Assumed care on patient , repositioned for comfort , assisted to use a urinal , denies pain , IV site intact.

## 2020-01-22 NOTE — ED Provider Notes (Signed)
  Provider Note MRN:  [id number]  Arrival date & time: 01/22/20    ED Course and Medical Decision Making  Assumed care from Dr. Myrtis Ser at shift change.  Going to facility, Covid test pending.  Ready to go when resulted.  Covid negative.  Patient's destination plan has fallen through Per social work, will remain here overnight.  Home meds ordered.  Signed out to default provider.  Procedures  Final Clinical Impressions(s) / ED Diagnoses     ICD-10-CM   1. Fall, initial encounter  W19.XXXA   2. Closed head injury, initial encounter  S09.90XA   3. Anticoagulated by anticoagulation treatment  Z79.01   4. Weakness  R53.1     ED Discharge Orders    None        Discharge Instructions     Continue medications as previously prescribed.  Follow-up with your primary doctor later this week, and return to the emergency department if you develop any new and/or concerning symptoms.    Elmer Sow. Pilar Plate, MD [facility] [email or web address]    Sabas Sous, MD 01/22/20 [id number]

## 2020-01-22 NOTE — Discharge Planning (Signed)
Licensed Clinical Social Worker is seeking post-discharge placement for this patient at the following level of care: SNF    

## 2020-01-22 NOTE — ED Notes (Signed)
Patient requesting to speak with manager and security, stating that he had one hundred dollars and a cell phone when he arrived and that he no longer does. This RN and NT Margreta Journey looked through patient's belongings in front of patient and located money in pocket of pants that patient was wearing on arrival to ED. Patient's cell phone found under patient in bed. Patient repositioned for comfort, belongings placed in bed with patient, call light provided.

## 2020-01-22 NOTE — ED Notes (Signed)
Pt transitioned into a Hospital Bed for comfort. Pt currently resting at this time and was informed on how to use bed mechanisms to adjust bed settings. Call Bell within reach.

## 2020-01-22 NOTE — NC FL2 (Signed)
[place] MEDICAID FL2 LEVEL OF CARE SCREENING TOOL     IDENTIFICATION  Patient Name: Chad Duke Birthdate: [birth year] Sex: male Admission Date (Current Location): 01/21/2020  [facility] and [place] Number:  Producer, television/film/video and Address:  [facility] [place], [address] [place], [place] [address]      Provider Number: [id number]  Attending Physician Name and Address:  Default, Provider, MD  Relative Name and Phone Number:       Current Level of Care: Hospital Recommended Level of Care: Skilled Nursing Facility Prior Approval Number:    Date Approved/Denied:   PASRR Number: [id number]  Discharge Plan: SNF    Current Diagnoses: Patient Active Problem List   Diagnosis Date Noted  . Chronic renal disease, stage III (HCC) 01/25/2018  . Weakness 10/03/2017  . Adult failure to thrive 10/03/2017  . Positive D dimer 09/26/2017  . Elevated troponin 09/26/2017  . Personal history of DVT (deep vein thrombosis) 09/26/2017  . COPD with acute exacerbation (HCC) 09/26/2017  . CAD (coronary artery disease) 09/08/2017  . Goals of care, counseling/discussion   . Palliative care by specialist   . Acute kidney injury superimposed on chronic kidney disease (HCC) 09/02/2017  . Hypokalemia 09/02/2017  . Hypomagnesemia 09/02/2017  . Controlled type 2 diabetes mellitus with hyperglycemia (HCC) 09/02/2017  . Urinary incontinence 09/02/2017  . Fall 08/13/2017  . Acute renal failure superimposed on stage 3 chronic kidney disease (HCC) 08/13/2017  . HLD (hyperlipidemia) 08/13/2017  . Essential hypertension 08/13/2017  . GERD (gastroesophageal reflux disease) 08/13/2017  . Orthostatic hypotension 08/13/2017  . Deep vein thrombosis (DVT) of both lower extremities (HCC)   . Hyperglycemia   . History of pulmonary embolism 07/20/2017  . Acute on chronic diastolic heart failure (HCC) 07/20/2017  . Bilateral leg edema 07/20/2017  . Acute exacerbation of chronic  obstructive pulmonary disease (COPD) (HCC) 07/19/2017  . 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 Cigarette smoker 09/08/2016  . Morbid obesity due to excess calories (HCC) 09/08/2016  . Pain in joint, ankle and foot 03/31/2012  . Abnormality of gait 03/31/2012  . Rheumatoid arthritis (HCC) 03/31/2012  . COPD GOLD III  02/12/2011  . Sleep apnea 02/12/2011    Orientation RESPIRATION BLADDER Height & Weight     Place,Situation,Time,Self  Normal Continent Weight: 218 lb 4.1 oz (99 kg) Height:  5\' 7"  (170.2 cm)  BEHAVIORAL SYMPTOMS/MOOD NEUROLOGICAL BOWEL NUTRITION STATUS      Continent Diet  AMBULATORY STATUS COMMUNICATION OF NEEDS Skin   Extensive Assist Verbally Bruising                       Personal Care Assistance Level of Assistance  Bathing,Feeding,Dressing Bathing Assistance: Maximum assistance Feeding assistance: Independent Dressing Assistance: Maximum assistance     Functional Limitations Info  Sight,Hearing,Speech Sight Info: Impaired (Glasses) Hearing Info: Impaired (Hard of hearing - no hearing aids) Speech Info: Adequate    SPECIAL CARE FACTORS FREQUENCY  PT (By licensed PT),OT (By licensed OT)     PT Frequency: 5x weekly OT Frequency: 5x weekly            Contractures      Additional Factors Info  Allergies   Allergies Info: Varencline           Current Medications (01/22/2020):  This is the current hospital active medication list No current facility-administered medications for this encounter.   Current Outpatient Medications  Medication Sig Dispense Refill  . albuterol (  PROVENTIL HFA;VENTOLIN HFA) 108 (90 Base) MCG/ACT inhaler Inhale 2 puffs into the lungs every 4 (four) hours as needed for wheezing or shortness of breath. 3 Inhaler 1  . allopurinol (ZYLOPRIM) 100 MG tablet Take 100 mg by mouth daily.    Marland Kitchen apixaban (ELIQUIS) 5 MG TABS tablet Take 1 tablet (5 mg total) by mouth 2 (two) times daily. 1 tablet 0  . atorvastatin  (LIPITOR) 40 MG tablet Take 1 tablet (40 mg total) by mouth daily. (Patient taking differently: Take 40 mg by mouth at bedtime.) 90 tablet 3  . budesonide (PULMICORT) 0.25 MG/2ML nebulizer solution Take 0.25 mg by nebulization 2 (two) times daily.    Marland Kitchen buPROPion (ZYBAN) 150 MG 12 hr tablet Take 1 tablet (150 mg total) by mouth 2 (two) times daily. 60 tablet 3  . CALCIUM-MAGNESIUM-VITAMIN D PO Take 1 tablet by mouth daily.    . COMBIVENT RESPIMAT 20-100 MCG/ACT AERS respimat Inhale 1 puff into the lungs 4 (four) times daily.    . furosemide (LASIX) 40 MG tablet Take 40 mg by mouth daily.    Marland Kitchen inFLIXimab (REMICADE) 100 MG injection Inject into the vein every 6 (six) weeks.    Marland Kitchen ipratropium-albuterol (DUONEB) 0.5-2.5 (3) MG/3ML SOLN Take 3 mLs by nebulization 3 (three) times daily.     . Magnesium 400 MG CAPS Take 400 mg by mouth at bedtime.     . meclizine (ANTIVERT) 12.5 MG tablet Take 1 tablet (12.5 mg total) by mouth 3 (three) times daily as needed for dizziness. 30 tablet 0  . metFORMIN (GLUCOPHAGE) 500 MG tablet Take 500 mg by mouth 2 (two) times daily.    . pantoprazole (PROTONIX) 40 MG tablet Take 40 mg by mouth daily.     . potassium chloride SA (K-DUR,KLOR-CON) 20 MEQ tablet Take 40 mEq by mouth daily.    . predniSONE (DELTASONE) 5 MG tablet Take 10 mg by mouth daily as needed (flares).    . SYMBICORT 160-4.5 MCG/ACT inhaler Inhale 2 puffs into the lungs in the morning and at bedtime.    . vitamin C (ASCORBIC ACID) 500 MG tablet Take 1,000 mg by mouth at bedtime.       Discharge Medications: Please see discharge summary for a list of discharge medications.  Relevant Imaging Results:  Relevant Lab Results:   Additional Information SSN: [id number]  Inis Sizer, LCSW

## 2020-01-22 NOTE — Evaluation (Signed)
Physical Therapy Evaluation Patient Details Name: Chad Duke MRN: [id number] DOB: 01/14/1946 Today's Date: 01/22/2020   History of Present Illness  Pt is a 74 y/o male presenting after weakness and fall.  PMH includes AAA, HTN, CHF, RA, and sleep apnea on CPAP.  Clinical Impression  Pt admitted secondary to problem above with deficits below. Pt requiring mod A to perform bed mobility tasks this session. Unsafe to stand from higher stretcher height. Pt was able to perform HEP, however, demonstrated SOB. Pt currently lives alone and has had multiple falls at home. Recommending SNF level therapies at d/c. Will continue to follow acutely.     Follow Up Recommendations SNF;Supervision/Assistance - 24 hour    Equipment Recommendations  Wheelchair cushion (measurements PT);Wheelchair (measurements PT)    Recommendations for Other Services       Precautions / Restrictions Precautions Precautions: Fall Precaution Comments: Multiple falls Restrictions Weight Bearing Restrictions: No      Mobility  Bed Mobility Overal bed mobility: Needs Assistance Bed Mobility: Supine to Sit;Sit to Supine     Supine to sit: Mod assist Sit to supine: Mod assist   General bed mobility comments: Pt requiring assist for trunk and LEs. INcreased time and effort to come to EOB. Pt with increased SOB as well. Unsafe to attempt further mobility from higher stretcher height.    Transfers                    Ambulation/Gait                Stairs            Wheelchair Mobility    Modified Rankin (Stroke Patients Only)       Balance Overall balance assessment: Needs assistance Sitting-balance support: Bilateral upper extremity supported;Feet unsupported Sitting balance-Leahy Scale: Poor Sitting balance - Comments: REliant on BUE support                                     Pertinent Vitals/Pain Pain Assessment: No/denies pain    Home Living  Family/patient expects to be discharged to:: Skilled nursing facility                 Additional Comments: Pt lives alone and reports he has no one to assist.    Prior Function Level of Independence: Independent with assistive device(s)         Comments: Reports he sometimes would use a rollator     Hand Dominance        Extremity/Trunk Assessment   Upper Extremity Assessment Upper Extremity Assessment: Generalized weakness    Lower Extremity Assessment Lower Extremity Assessment: Generalized weakness    Cervical / Trunk Assessment Cervical / Trunk Assessment: Normal  Communication   Communication: No difficulties  Cognition Arousal/Alertness: Awake/alert Behavior During Therapy: WFL for tasks assessed/performed Overall Cognitive Status: Within Functional Limits for tasks assessed                                        General Comments General comments (skin integrity, edema, etc.): Pt reports concerns about returning home.    Exercises General Exercises - Lower Extremity Ankle Circles/Pumps: AROM;Both;10 reps;Limitations Ankle Circles/Pumps Limitations: Limited ROM Long Arc Quad: AROM;Both;10 reps;Supine Hip Flexion/Marching: AROM;Both;10 reps;Seated   Assessment/Plan    PT Assessment Patient needs  continued PT services  PT Problem List Decreased strength;Decreased balance;Decreased mobility;Decreased activity tolerance;Pain       PT Treatment Interventions DME instruction;Gait training;Functional mobility training;Therapeutic exercise;Therapeutic activities;Balance training;Patient/family education    PT Goals (Current goals can be found in the Care Plan section)  Acute Rehab PT Goals Patient Stated Goal: to go to SNF prior to return home PT Goal Formulation: With patient Time For Goal Achievement: 02/05/20 Potential to Achieve Goals: Fair    Frequency Min 2X/week   Barriers to discharge        Co-evaluation                AM-PAC PT "6 Clicks" Mobility  Outcome Measure Help needed turning from your back to your side while in a flat bed without using bedrails?: A Lot Help needed moving from lying on your back to sitting on the side of a flat bed without using bedrails?: A Lot Help needed moving to and from a bed to a chair (including a wheelchair)?: A Lot Help needed standing up from a chair using your arms (e.g., wheelchair or bedside chair)?: A Lot Help needed to walk in hospital room?: A Lot Help needed climbing 3-5 steps with a railing? : A Lot 6 Click Score: 12    End of Session   Activity Tolerance: Patient tolerated treatment well Patient left: in bed;with call bell/phone within reach (on stretcher in ED) Nurse Communication: Mobility status PT Visit Diagnosis: Unsteadiness on feet (R26.81);Muscle weakness (generalized) (M62.81);Repeated falls (R29.6);History of falling (Z91.81)    Time: 7619-5093 PT Time Calculation (min) (ACUTE ONLY): 13 min   Charges:   PT Evaluation $PT Eval Moderate Complexity: 1 Mod          Farley Ly, PT, DPT  Acute Rehabilitation Services  Pager: [phone] Office: [phone]   Lehman Prom 01/22/2020, 10:22 AM

## 2020-01-22 NOTE — ED Notes (Addendum)
EDP ( Dr. Judd Lien) notified that patient is unable to stand up/ambulate and transfer to wheelchair during discharge  , lives by himself and will need extensive assistance .

## 2020-01-22 NOTE — TOC Initial Note (Signed)
Transition of Care [facility]) - Initial/Assessment Note    Patient Details  Name: Navin Dogan MRN: [id number] Date of Birth: [birth year]  [facility] CM/SW Contact:    Inis Sizer, LCSW Phone Number: 01/22/2020, 8:15 AM  Clinical Narrative:                 CSW spoke with patient at bedside. Patient reports he lives alone and has no family or friend support. Patient states he is unable to walk independently. Patient reports he has a rollator at home that he utilizes. Patient reports he has been to rehab before at [facility]. Patient stated no preference for new facility placement. Patient concerned about what clothes he will wear at the facility, as the ones he came in wearing are wet.  CSW informed patient he will be evaluated by PT for formal recommendations - he is agreeable.  Expected Discharge Plan: Skilled Nursing Facility Barriers to Discharge: SNF Pending bed offer   Patient Goals and CMS Choice Patient states their goals for this hospitalization and ongoing recovery are:: Regain strength      Expected Discharge Plan and Services Expected Discharge Plan: Skilled Nursing Facility                                              Prior Living Arrangements/Services              Need for Family Participation in Patient Care: Yes (Comment) Care giver support system in place?: No (comment)   Criminal Activity/Legal Involvement Pertinent to Current Situation/Hospitalization: No - Comment as needed  Activities of Daily Living      Permission Sought/Granted                  Emotional Assessment Appearance:: Appears stated age Attitude/Demeanor/Rapport: Engaged,Gracious Affect (typically observed): Accepting Orientation: : Oriented to Self,Oriented to Place,Oriented to  Time,Oriented to Situation Alcohol / Substance Use: Not Applicable Psych Involvement: No (comment)  Admission diagnosis:  Fall, on thinners Patient Active Problem List    Diagnosis Date Noted  . Chronic renal disease, stage III (HCC) 01/25/2018  . Weakness 10/03/2017  . Adult failure to thrive 10/03/2017  . Positive D dimer 09/26/2017  . Elevated troponin 09/26/2017  . Personal history of DVT (deep vein thrombosis) 09/26/2017  . COPD with acute exacerbation (HCC) 09/26/2017  . CAD (coronary artery disease) 09/08/2017  . Goals of care, counseling/discussion   . Palliative care by specialist   . Acute kidney injury superimposed on chronic kidney disease (HCC) 09/02/2017  . Hypokalemia 09/02/2017  . Hypomagnesemia 09/02/2017  . Controlled type 2 diabetes mellitus with hyperglycemia (HCC) 09/02/2017  . Urinary incontinence 09/02/2017  . Fall 08/13/2017  . Acute renal failure superimposed on stage 3 chronic kidney disease (HCC) 08/13/2017  . HLD (hyperlipidemia) 08/13/2017  . Essential hypertension 08/13/2017  . GERD (gastroesophageal reflux disease) 08/13/2017  . Orthostatic hypotension 08/13/2017  . Deep vein thrombosis (DVT) of both lower extremities (HCC)   . Hyperglycemia   . History of pulmonary embolism 07/20/2017  . Acute on chronic diastolic heart failure (HCC) 07/20/2017  . Bilateral leg edema 07/20/2017  . Acute exacerbation of chronic obstructive pulmonary disease (COPD) (HCC) 07/19/2017  . 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 Cigarette smoker 09/08/2016  . Morbid obesity due to excess calories (HCC) 09/08/2016  . Pain in joint, ankle and  foot 03/31/2012  . Abnormality of gait 03/31/2012  . Rheumatoid arthritis (HCC) 03/31/2012  . COPD GOLD III  02/12/2011  . Sleep apnea 02/12/2011   PCP:  Soundra Pilon, FNP Pharmacy:   [facility] - [place], [place] - [address] [place] [address] Phone: [phone] Fax: [phone]  [id number] [place] [address] [facility] - [address] [place] [address] Phone: [phone] Fax: [phone]     Social Determinants of Health (SDOH)  Interventions    Readmission Risk Interventions No flowsheet data found.

## 2020-01-22 NOTE — ED Provider Notes (Addendum)
Medical Decision Making: Care of patient assumed from Dr. Judd Lien at 0700.  Agree with history, physical exam and plan.  See their note for further details.  Briefly, The pt p/w patient has been having worsening fatigue and deconditioning at home, had a fall, had CT imaging and laboratory studies with no acute findings.  Needs physical therapy occupational therapy evaluation and social work consultation for inability to ambulate..   Current plan is as follows: PT and SW  PT and SW feel that this pt needs placement in SNF, awaiting placement.  Patient has been evaluated by social work and physical therapy and does require placement skilled nursing/rehab, he has a facility that is willing to take him pending a negative Covid test.  That test is ordered social work is at bedside ready to discharge as soon as the test is back  I personally reviewed and interpreted all labs/imaging.      Sabino Donovan, MD 01/22/20 1108    Sabino Donovan, MD 01/22/20 [phone]

## 2020-01-22 NOTE — Progress Notes (Addendum)
2:50pm: CSW faxed patient's AVS to [place] at [facility].  1:45pm: CSW received notification from [facility] stating there has been a positive COVID case at the facility and admissions are on hold.  CSW spoke with patient again - he is agreeable to accept bed offer from [facility].  Dr. Myrtis Ser ordered rapid COVID test.  11:20am: CSW presented patient with bed offers from [facility], [facility], and [facility] - patient states he wants to accept the bed offer from [facility].  CSW notified Janie at [facility] of information.   Edwin Dada, MSW, LCSW-A Transitions of Care  Clinical Social Worker I [facility]  Medical ICU [phone]

## 2020-01-22 NOTE — ED Provider Notes (Addendum)
Care assumed from Dr. Criss Alvine at shift change.  Patient presented here complaining of weakness and fall.  Patient was getting food out of the refrigerator when he lost his balance and fell.  He does report striking his head and is on Eliquis.  His work-up thus far is unremarkable.  Care signed out to me awaiting results of urinalysis and reassessment after his head CT.  Patient head CT is negative and remains neurologically intact.  His urinalysis is clear.  At this point, discharge seems appropriate.  Patient to follow-up with primary doctor and return as needed for any problems.   Geoffery Lyons, MD 01/22/20 0147   ADDENDUM:  Patient unable to stand and support his own weight upon discharge.  Patient has been reevaluated and appears neurologically intact.  I am uncertain as to the exact etiology of his weakness.  Nothing in his laboratory studies as provided a cause.  At this point, I will consult case management for possible rehab placement/home health as I do not feel as though patient can be safely discharged as he lives alone and has no additional assistance.    Geoffery Lyons, MD 01/22/20 [phone]

## 2020-01-23 DIAGNOSIS — S0990XA Unspecified injury of head, initial encounter: Secondary | ICD-10-CM | POA: Diagnosis not present

## 2020-01-23 NOTE — ED Notes (Signed)
Report called to [facility]

## 2020-01-23 NOTE — Progress Notes (Signed)
CSW spoke with Tresa Endo at [facility]. Debbie to call for PTAR. Lelon Mast, RN aware of patient's discharge plan.  The number to call report is [phone].  Edwin Dada, MSW, LCSW-A Transitions of Care  Clinical Social Worker I [facility] Emergency Departments  Medical ICU [phone]

## 2020-01-25 IMAGING — DX DG CHEST 2V
2 series · 2 of 2 positions shown · non-contrast
Comparison: 07/20/2017 CT angiogram of the chest. 07/19/2017 chest
radiograph.

CLINICAL DATA: 72 y/o  M; fall and generalized weakness.

EXAM:
CHEST - 2 VIEW

[chest lat]
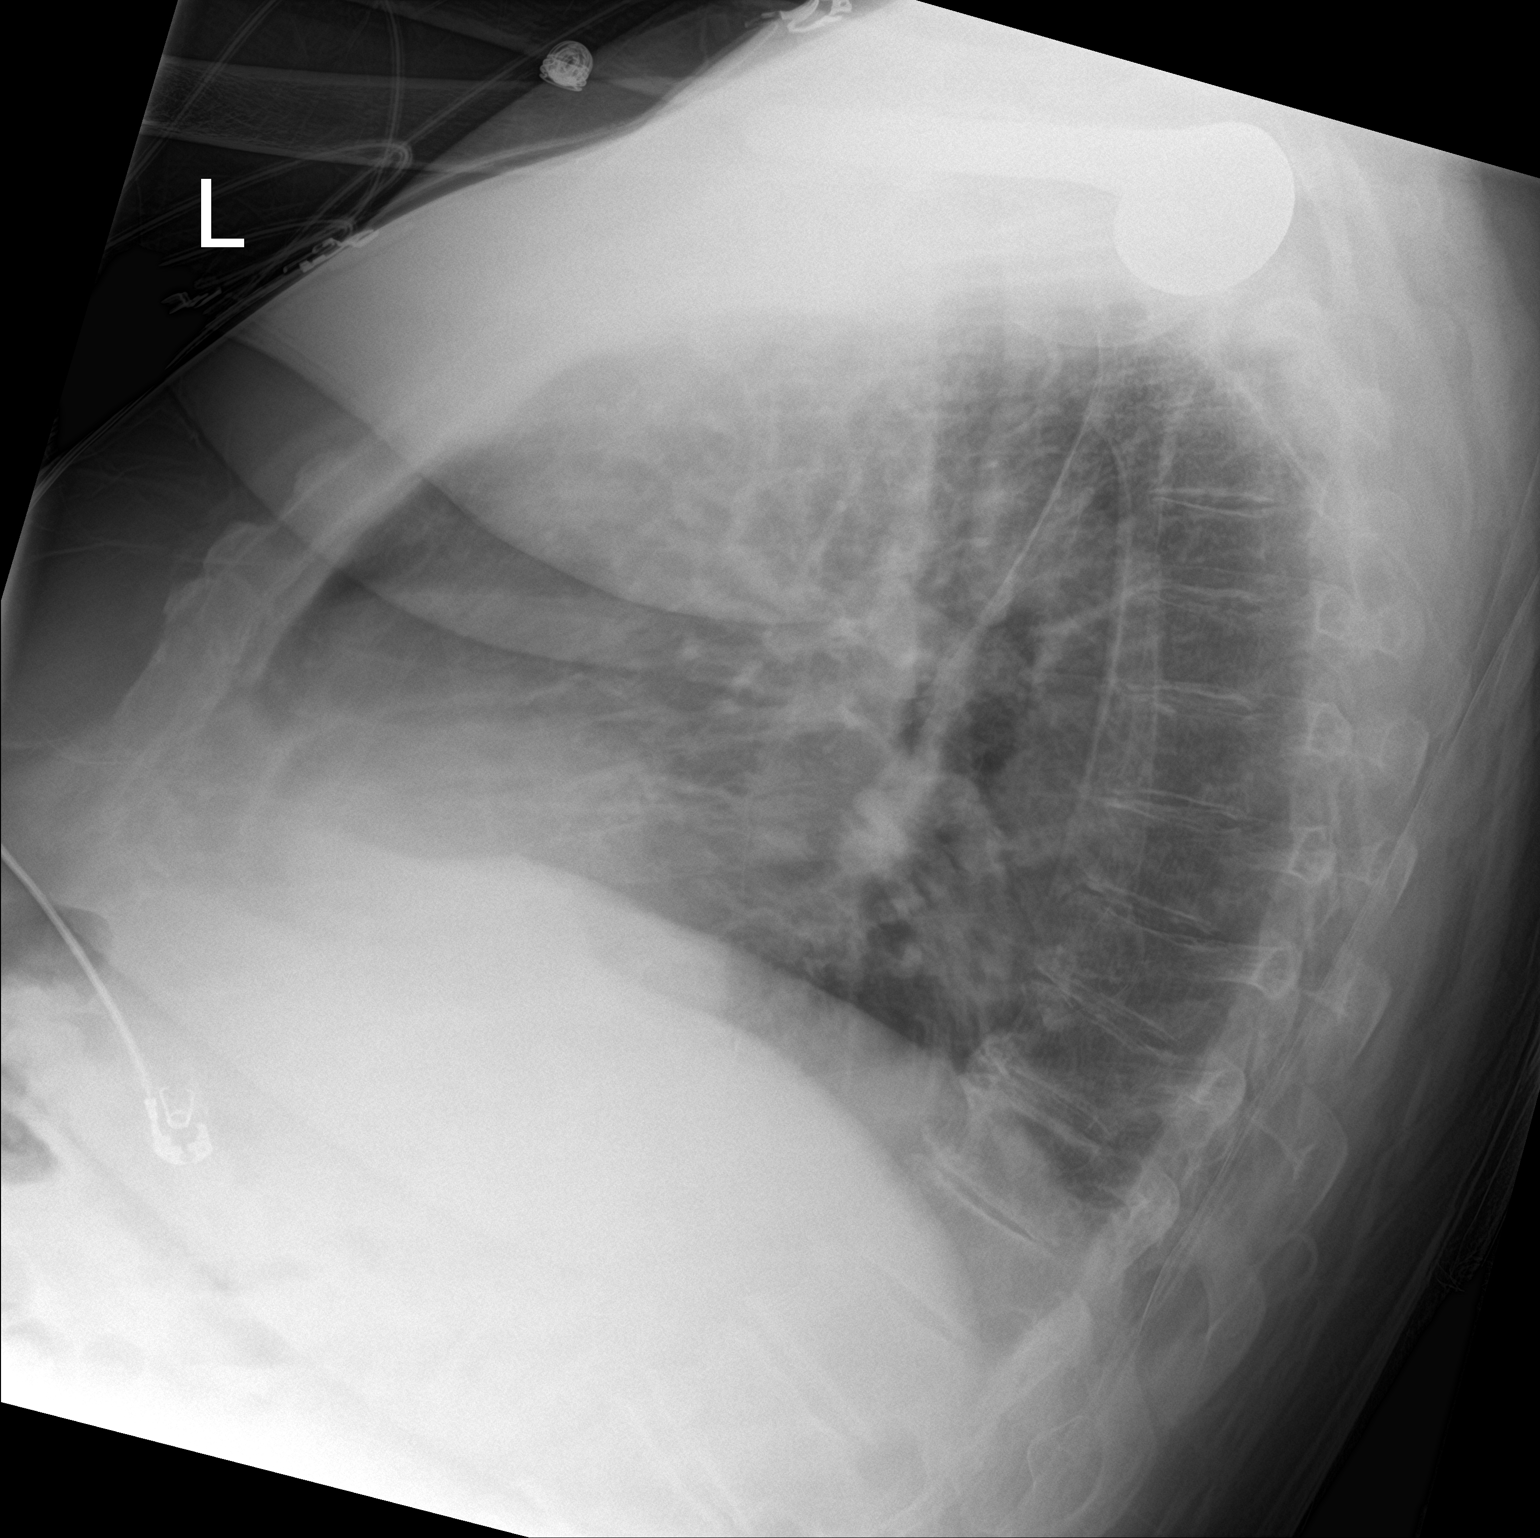

[chest ap]
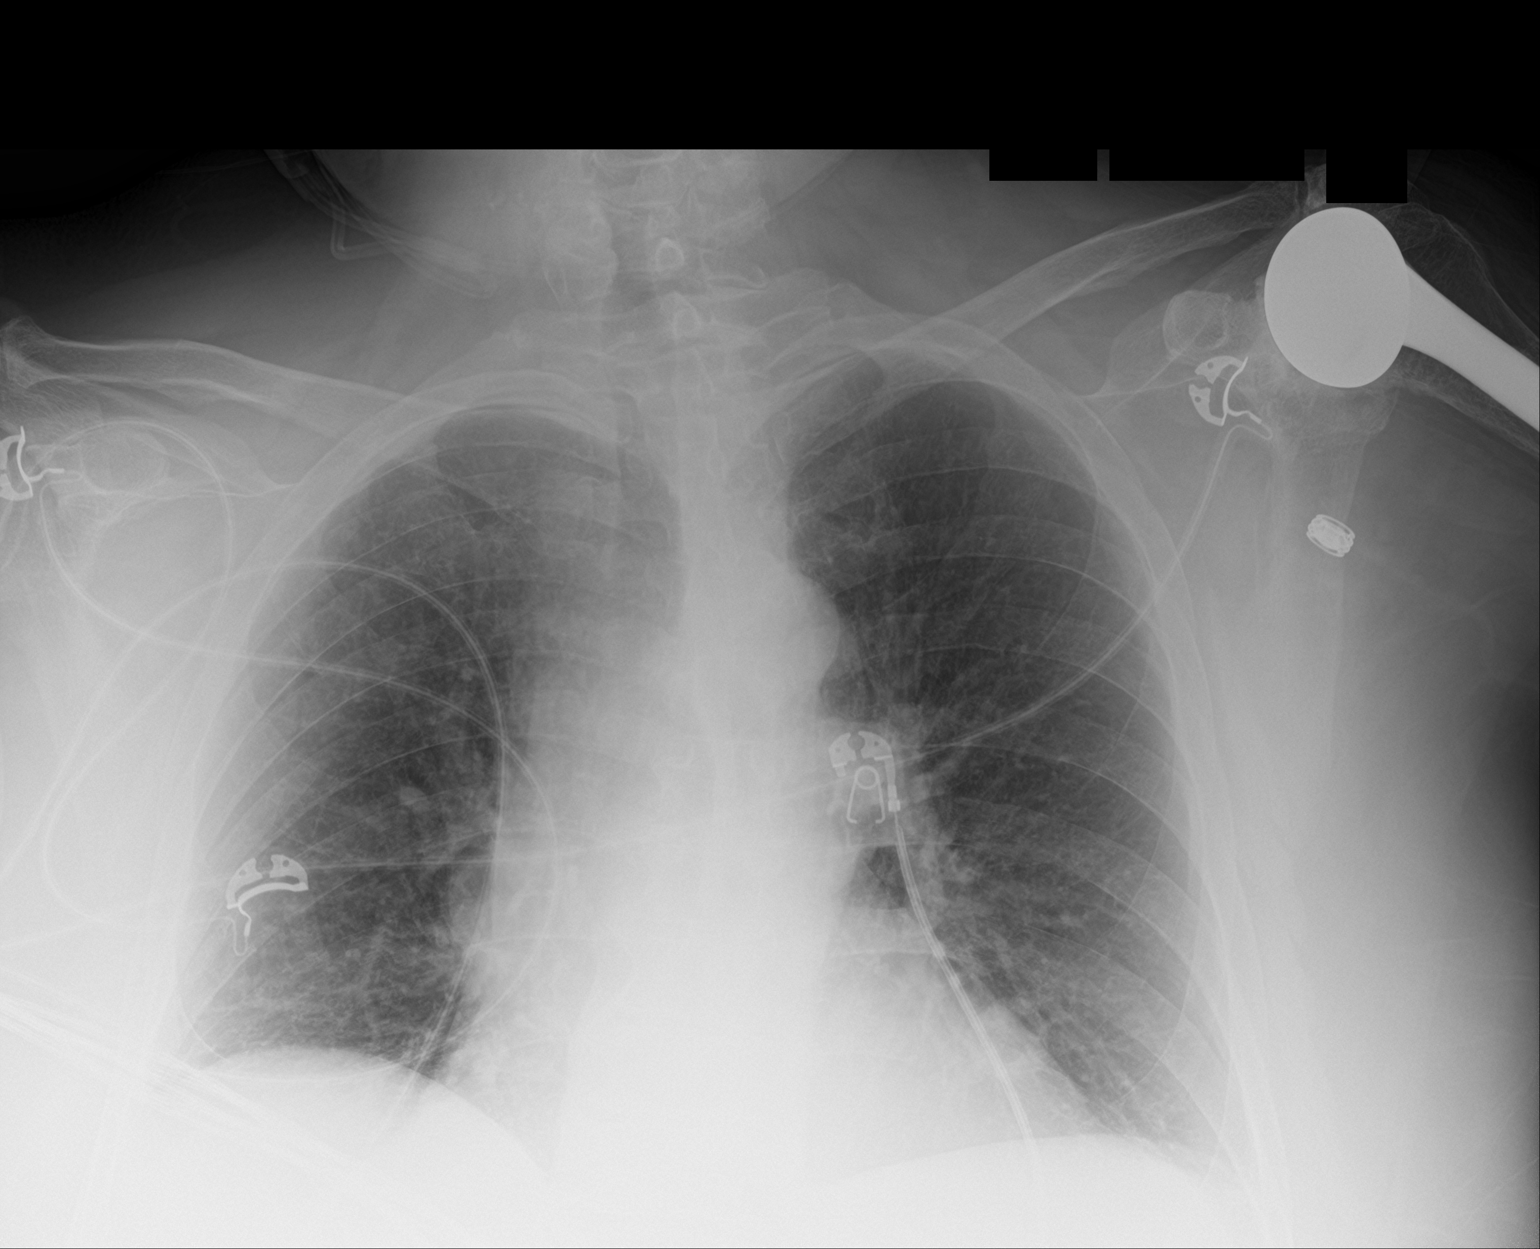

[2 of 2 positions shown; findings below may reference images not displayed]

FINDINGS: Stable heart size and mediastinal contours are within normal limits.
Both lungs are clear. Partially visualized left shoulder
arthroplasty and degenerative changes. No acute osseous abnormality
is evident.
IMPRESSION: No acute pulmonary process identified.

By: Goccaffe Filicha M.D.

## 2020-02-10 DIAGNOSIS — N179 Acute kidney failure, unspecified: Secondary | ICD-10-CM | POA: Diagnosis not present

## 2020-02-10 DIAGNOSIS — N1831 Chronic kidney disease, stage 3a: Secondary | ICD-10-CM | POA: Diagnosis present

## 2020-02-10 DIAGNOSIS — J45909 Unspecified asthma, uncomplicated: Secondary | ICD-10-CM | POA: Diagnosis not present

## 2020-02-10 DIAGNOSIS — K1120 Sialoadenitis, unspecified: Secondary | ICD-10-CM | POA: Diagnosis not present

## 2020-02-10 DIAGNOSIS — S0302XA Dislocation of jaw, left side, initial encounter: Secondary | ICD-10-CM | POA: Diagnosis not present

## 2020-02-10 DIAGNOSIS — J441 Chronic obstructive pulmonary disease with (acute) exacerbation: Secondary | ICD-10-CM | POA: Diagnosis not present

## 2020-02-10 DIAGNOSIS — R0902 Hypoxemia: Secondary | ICD-10-CM | POA: Diagnosis not present

## 2020-02-10 DIAGNOSIS — A419 Sepsis, unspecified organism: Secondary | ICD-10-CM | POA: Diagnosis not present

## 2020-02-10 DIAGNOSIS — I5033 Acute on chronic diastolic (congestive) heart failure: Secondary | ICD-10-CM | POA: Diagnosis not present

## 2020-02-10 DIAGNOSIS — Z7901 Long term (current) use of anticoagulants: Secondary | ICD-10-CM | POA: Diagnosis not present

## 2020-02-10 DIAGNOSIS — Z86718 Personal history of other venous thrombosis and embolism: Secondary | ICD-10-CM | POA: Diagnosis not present

## 2020-02-10 DIAGNOSIS — F05 Delirium due to known physiological condition: Secondary | ICD-10-CM | POA: Diagnosis not present

## 2020-02-10 DIAGNOSIS — R059 Cough, unspecified: Secondary | ICD-10-CM | POA: Diagnosis not present

## 2020-02-10 DIAGNOSIS — I5032 Chronic diastolic (congestive) heart failure: Secondary | ICD-10-CM | POA: Diagnosis not present

## 2020-02-10 DIAGNOSIS — I2510 Atherosclerotic heart disease of native coronary artery without angina pectoris: Secondary | ICD-10-CM | POA: Diagnosis present

## 2020-02-10 DIAGNOSIS — M069 Rheumatoid arthritis, unspecified: Secondary | ICD-10-CM | POA: Diagnosis present

## 2020-02-10 DIAGNOSIS — R279 Unspecified lack of coordination: Secondary | ICD-10-CM | POA: Diagnosis not present

## 2020-02-10 DIAGNOSIS — G4730 Sleep apnea, unspecified: Secondary | ICD-10-CM | POA: Diagnosis not present

## 2020-02-10 DIAGNOSIS — K219 Gastro-esophageal reflux disease without esophagitis: Secondary | ICD-10-CM | POA: Diagnosis present

## 2020-02-10 DIAGNOSIS — R404 Transient alteration of awareness: Secondary | ICD-10-CM | POA: Diagnosis not present

## 2020-02-10 DIAGNOSIS — H9193 Unspecified hearing loss, bilateral: Secondary | ICD-10-CM | POA: Diagnosis present

## 2020-02-10 DIAGNOSIS — J449 Chronic obstructive pulmonary disease, unspecified: Secondary | ICD-10-CM | POA: Diagnosis not present

## 2020-02-10 DIAGNOSIS — Z66 Do not resuscitate: Secondary | ICD-10-CM | POA: Diagnosis not present

## 2020-02-10 DIAGNOSIS — Y95 Nosocomial condition: Secondary | ICD-10-CM | POA: Diagnosis not present

## 2020-02-10 DIAGNOSIS — Z8616 Personal history of COVID-19: Secondary | ICD-10-CM | POA: Diagnosis not present

## 2020-02-10 DIAGNOSIS — Z79899 Other long term (current) drug therapy: Secondary | ICD-10-CM | POA: Diagnosis not present

## 2020-02-10 DIAGNOSIS — I6782 Cerebral ischemia: Secondary | ICD-10-CM | POA: Diagnosis not present

## 2020-02-10 DIAGNOSIS — U071 COVID-19: Secondary | ICD-10-CM | POA: Diagnosis not present

## 2020-02-10 DIAGNOSIS — N183 Chronic kidney disease, stage 3 unspecified: Secondary | ICD-10-CM | POA: Diagnosis not present

## 2020-02-10 DIAGNOSIS — E1165 Type 2 diabetes mellitus with hyperglycemia: Secondary | ICD-10-CM | POA: Diagnosis not present

## 2020-02-10 DIAGNOSIS — R5381 Other malaise: Secondary | ICD-10-CM | POA: Diagnosis not present

## 2020-02-10 DIAGNOSIS — R0689 Other abnormalities of breathing: Secondary | ICD-10-CM | POA: Diagnosis not present

## 2020-02-10 DIAGNOSIS — R2689 Other abnormalities of gait and mobility: Secondary | ICD-10-CM | POA: Diagnosis not present

## 2020-02-10 DIAGNOSIS — R0989 Other specified symptoms and signs involving the circulatory and respiratory systems: Secondary | ICD-10-CM | POA: Diagnosis not present

## 2020-02-10 DIAGNOSIS — I130 Hypertensive heart and chronic kidney disease with heart failure and stage 1 through stage 4 chronic kidney disease, or unspecified chronic kidney disease: Secondary | ICD-10-CM | POA: Diagnosis not present

## 2020-02-10 DIAGNOSIS — E870 Hyperosmolality and hypernatremia: Secondary | ICD-10-CM | POA: Diagnosis not present

## 2020-02-10 DIAGNOSIS — E860 Dehydration: Secondary | ICD-10-CM | POA: Diagnosis present

## 2020-02-10 DIAGNOSIS — J9611 Chronic respiratory failure with hypoxia: Secondary | ICD-10-CM | POA: Diagnosis not present

## 2020-02-10 DIAGNOSIS — G319 Degenerative disease of nervous system, unspecified: Secondary | ICD-10-CM | POA: Diagnosis not present

## 2020-02-10 DIAGNOSIS — R000 Tachycardia, unspecified: Secondary | ICD-10-CM | POA: Diagnosis not present

## 2020-02-10 DIAGNOSIS — E785 Hyperlipidemia, unspecified: Secondary | ICD-10-CM | POA: Diagnosis present

## 2020-02-10 DIAGNOSIS — R058 Other specified cough: Secondary | ICD-10-CM | POA: Diagnosis not present

## 2020-02-10 DIAGNOSIS — R0600 Dyspnea, unspecified: Secondary | ICD-10-CM | POA: Diagnosis not present

## 2020-02-10 DIAGNOSIS — I452 Bifascicular block: Secondary | ICD-10-CM | POA: Diagnosis not present

## 2020-02-10 DIAGNOSIS — R52 Pain, unspecified: Secondary | ICD-10-CM | POA: Diagnosis not present

## 2020-02-10 DIAGNOSIS — R4182 Altered mental status, unspecified: Secondary | ICD-10-CM | POA: Diagnosis not present

## 2020-02-10 DIAGNOSIS — F17210 Nicotine dependence, cigarettes, uncomplicated: Secondary | ICD-10-CM | POA: Diagnosis not present

## 2020-02-10 DIAGNOSIS — S199XXA Unspecified injury of neck, initial encounter: Secondary | ICD-10-CM | POA: Diagnosis not present

## 2020-02-10 DIAGNOSIS — Z7984 Long term (current) use of oral hypoglycemic drugs: Secondary | ICD-10-CM | POA: Diagnosis not present

## 2020-02-10 DIAGNOSIS — R9082 White matter disease, unspecified: Secondary | ICD-10-CM | POA: Diagnosis not present

## 2020-02-10 DIAGNOSIS — R402421 Glasgow coma scale score 9-12, in the field [EMT or ambulance]: Secondary | ICD-10-CM | POA: Diagnosis not present

## 2020-02-10 DIAGNOSIS — R220 Localized swelling, mass and lump, head: Secondary | ICD-10-CM | POA: Diagnosis not present

## 2020-02-10 DIAGNOSIS — D649 Anemia, unspecified: Secondary | ICD-10-CM | POA: Diagnosis not present

## 2020-02-10 DIAGNOSIS — E1122 Type 2 diabetes mellitus with diabetic chronic kidney disease: Secondary | ICD-10-CM | POA: Diagnosis present

## 2020-02-10 DIAGNOSIS — Z96612 Presence of left artificial shoulder joint: Secondary | ICD-10-CM | POA: Diagnosis not present

## 2020-02-10 DIAGNOSIS — I4510 Unspecified right bundle-branch block: Secondary | ICD-10-CM | POA: Diagnosis not present

## 2020-02-10 DIAGNOSIS — M50322 Other cervical disc degeneration at C5-C6 level: Secondary | ICD-10-CM | POA: Diagnosis not present

## 2020-02-10 DIAGNOSIS — F32A Depression, unspecified: Secondary | ICD-10-CM | POA: Diagnosis not present

## 2020-02-10 DIAGNOSIS — L89626 Pressure-induced deep tissue damage of left heel: Secondary | ICD-10-CM | POA: Diagnosis not present

## 2020-02-10 DIAGNOSIS — M6281 Muscle weakness (generalized): Secondary | ICD-10-CM | POA: Diagnosis not present

## 2020-02-10 DIAGNOSIS — Z86711 Personal history of pulmonary embolism: Secondary | ICD-10-CM | POA: Diagnosis not present

## 2020-02-10 DIAGNOSIS — J189 Pneumonia, unspecified organism: Secondary | ICD-10-CM | POA: Diagnosis not present

## 2020-02-10 DIAGNOSIS — S0990XA Unspecified injury of head, initial encounter: Secondary | ICD-10-CM | POA: Diagnosis not present

## 2020-02-10 DIAGNOSIS — Y92002 Bathroom of unspecified non-institutional (private) residence single-family (private) house as the place of occurrence of the external cause: Secondary | ICD-10-CM | POA: Diagnosis not present

## 2020-02-10 DIAGNOSIS — Z96642 Presence of left artificial hip joint: Secondary | ICD-10-CM | POA: Diagnosis not present

## 2020-02-10 DIAGNOSIS — Z743 Need for continuous supervision: Secondary | ICD-10-CM | POA: Diagnosis not present

## 2020-02-10 DIAGNOSIS — Z955 Presence of coronary angioplasty implant and graft: Secondary | ICD-10-CM | POA: Diagnosis not present

## 2020-02-10 DIAGNOSIS — G9341 Metabolic encephalopathy: Secondary | ICD-10-CM | POA: Diagnosis not present

## 2020-02-10 DIAGNOSIS — W19XXXA Unspecified fall, initial encounter: Secondary | ICD-10-CM | POA: Diagnosis not present

## 2020-02-10 DIAGNOSIS — R6520 Severe sepsis without septic shock: Secondary | ICD-10-CM | POA: Diagnosis not present

## 2020-02-10 DIAGNOSIS — M109 Gout, unspecified: Secondary | ICD-10-CM | POA: Diagnosis not present

## 2020-02-10 DIAGNOSIS — J439 Emphysema, unspecified: Secondary | ICD-10-CM | POA: Diagnosis present

## 2020-02-10 DIAGNOSIS — K1121 Acute sialoadenitis: Secondary | ICD-10-CM | POA: Diagnosis present

## 2020-02-10 DIAGNOSIS — I714 Abdominal aortic aneurysm, without rupture: Secondary | ICD-10-CM | POA: Diagnosis present

## 2020-02-10 DIAGNOSIS — Z7951 Long term (current) use of inhaled steroids: Secondary | ICD-10-CM | POA: Diagnosis not present

## 2020-02-10 DIAGNOSIS — I10 Essential (primary) hypertension: Secondary | ICD-10-CM | POA: Diagnosis not present

## 2020-02-13 DIAGNOSIS — Z86718 Personal history of other venous thrombosis and embolism: Secondary | ICD-10-CM | POA: Diagnosis not present

## 2020-02-13 DIAGNOSIS — I5032 Chronic diastolic (congestive) heart failure: Secondary | ICD-10-CM | POA: Diagnosis not present

## 2020-02-13 DIAGNOSIS — M069 Rheumatoid arthritis, unspecified: Secondary | ICD-10-CM | POA: Diagnosis not present

## 2020-02-13 DIAGNOSIS — I10 Essential (primary) hypertension: Secondary | ICD-10-CM | POA: Diagnosis not present

## 2020-02-13 DIAGNOSIS — J449 Chronic obstructive pulmonary disease, unspecified: Secondary | ICD-10-CM | POA: Diagnosis not present

## 2020-02-13 DIAGNOSIS — U071 COVID-19: Secondary | ICD-10-CM | POA: Diagnosis not present

## 2020-02-14 DIAGNOSIS — R058 Other specified cough: Secondary | ICD-10-CM | POA: Diagnosis not present

## 2020-02-14 DIAGNOSIS — F05 Delirium due to known physiological condition: Secondary | ICD-10-CM | POA: Diagnosis not present

## 2020-02-14 DIAGNOSIS — R0989 Other specified symptoms and signs involving the circulatory and respiratory systems: Secondary | ICD-10-CM | POA: Diagnosis not present

## 2020-02-14 DIAGNOSIS — U071 COVID-19: Secondary | ICD-10-CM | POA: Diagnosis not present

## 2020-02-14 DIAGNOSIS — R5381 Other malaise: Secondary | ICD-10-CM | POA: Diagnosis not present

## 2020-02-15 DIAGNOSIS — U071 COVID-19: Secondary | ICD-10-CM | POA: Diagnosis not present

## 2020-02-15 DIAGNOSIS — J449 Chronic obstructive pulmonary disease, unspecified: Secondary | ICD-10-CM | POA: Diagnosis not present

## 2020-02-15 DIAGNOSIS — R058 Other specified cough: Secondary | ICD-10-CM | POA: Diagnosis not present

## 2020-02-15 DIAGNOSIS — R0989 Other specified symptoms and signs involving the circulatory and respiratory systems: Secondary | ICD-10-CM | POA: Diagnosis not present

## 2020-02-15 DIAGNOSIS — F05 Delirium due to known physiological condition: Secondary | ICD-10-CM | POA: Diagnosis not present

## 2020-02-19 ENCOUNTER — Other Ambulatory Visit: Payer: Self-pay

## 2020-02-19 ENCOUNTER — Encounter (HOSPITAL_COMMUNITY): Payer: Self-pay

## 2020-02-19 ENCOUNTER — Emergency Department (HOSPITAL_COMMUNITY): Payer: Medicare Other

## 2020-02-19 ENCOUNTER — Emergency Department (HOSPITAL_COMMUNITY)
Admission: EM | Admit: 2020-02-19 | Discharge: 2020-02-20 | Disposition: A | Discharge status: Home / Self Care | Payer: Medicare Other | Attending: Emergency Medicine | Admitting: Emergency Medicine

## 2020-02-19 DIAGNOSIS — J441 Chronic obstructive pulmonary disease with (acute) exacerbation: Secondary | ICD-10-CM | POA: Insufficient documentation

## 2020-02-19 DIAGNOSIS — Z955 Presence of coronary angioplasty implant and graft: Secondary | ICD-10-CM | POA: Diagnosis not present

## 2020-02-19 DIAGNOSIS — I5033 Acute on chronic diastolic (congestive) heart failure: Secondary | ICD-10-CM | POA: Diagnosis not present

## 2020-02-19 DIAGNOSIS — F17210 Nicotine dependence, cigarettes, uncomplicated: Secondary | ICD-10-CM | POA: Diagnosis not present

## 2020-02-19 DIAGNOSIS — Z7951 Long term (current) use of inhaled steroids: Secondary | ICD-10-CM | POA: Diagnosis not present

## 2020-02-19 DIAGNOSIS — Z96642 Presence of left artificial hip joint: Secondary | ICD-10-CM | POA: Diagnosis not present

## 2020-02-19 DIAGNOSIS — Z79899 Other long term (current) drug therapy: Secondary | ICD-10-CM | POA: Diagnosis not present

## 2020-02-19 DIAGNOSIS — U071 COVID-19: Secondary | ICD-10-CM | POA: Diagnosis not present

## 2020-02-19 DIAGNOSIS — I2510 Atherosclerotic heart disease of native coronary artery without angina pectoris: Secondary | ICD-10-CM | POA: Diagnosis not present

## 2020-02-19 DIAGNOSIS — Z96612 Presence of left artificial shoulder joint: Secondary | ICD-10-CM | POA: Diagnosis not present

## 2020-02-19 DIAGNOSIS — I130 Hypertensive heart and chronic kidney disease with heart failure and stage 1 through stage 4 chronic kidney disease, or unspecified chronic kidney disease: Secondary | ICD-10-CM | POA: Diagnosis not present

## 2020-02-19 DIAGNOSIS — J45909 Unspecified asthma, uncomplicated: Secondary | ICD-10-CM | POA: Diagnosis not present

## 2020-02-19 DIAGNOSIS — G319 Degenerative disease of nervous system, unspecified: Secondary | ICD-10-CM | POA: Diagnosis not present

## 2020-02-19 DIAGNOSIS — Z7901 Long term (current) use of anticoagulants: Secondary | ICD-10-CM | POA: Diagnosis not present

## 2020-02-19 DIAGNOSIS — W19XXXA Unspecified fall, initial encounter: Secondary | ICD-10-CM | POA: Insufficient documentation

## 2020-02-19 DIAGNOSIS — E1122 Type 2 diabetes mellitus with diabetic chronic kidney disease: Secondary | ICD-10-CM | POA: Insufficient documentation

## 2020-02-19 DIAGNOSIS — R9082 White matter disease, unspecified: Secondary | ICD-10-CM | POA: Diagnosis not present

## 2020-02-19 DIAGNOSIS — S0990XA Unspecified injury of head, initial encounter: Secondary | ICD-10-CM | POA: Diagnosis not present

## 2020-02-19 DIAGNOSIS — Y92002 Bathroom of unspecified non-institutional (private) residence single-family (private) house as the place of occurrence of the external cause: Secondary | ICD-10-CM | POA: Insufficient documentation

## 2020-02-19 DIAGNOSIS — I10 Essential (primary) hypertension: Secondary | ICD-10-CM | POA: Diagnosis not present

## 2020-02-19 DIAGNOSIS — Z7984 Long term (current) use of oral hypoglycemic drugs: Secondary | ICD-10-CM | POA: Diagnosis not present

## 2020-02-19 DIAGNOSIS — N183 Chronic kidney disease, stage 3 unspecified: Secondary | ICD-10-CM | POA: Insufficient documentation

## 2020-02-19 DIAGNOSIS — M6281 Muscle weakness (generalized): Secondary | ICD-10-CM | POA: Diagnosis not present

## 2020-02-19 DIAGNOSIS — F05 Delirium due to known physiological condition: Secondary | ICD-10-CM | POA: Diagnosis not present

## 2020-02-19 NOTE — ED Triage Notes (Signed)
Pt BIB [facility] from [facility]. Staff heard pt yelling for help, found him sitting on the floor against his bed. Pt c.o posterior head pain. Pt was sitting against the bed, bed was approx 1 ft from floor. No obvious injuries. Fall occurred at 1545. Provider evaluated him after fall, she felt he was more altered per his normal. Staff also reports pt answers questions appropriate but is more confused than his normal. Pt denies remember falling.  Pt is taking Eliquis  Pt tested covid + 1/4 pt is not here for covid symptoms  BP 121/76 HR 101 RR 22 91-92% RA, hx of COPD. Pt 96% 2L [place]   18g LAC  cbg 160

## 2020-02-20 ENCOUNTER — Emergency Department (HOSPITAL_COMMUNITY): Payer: Medicare Other

## 2020-02-20 DIAGNOSIS — S0990XA Unspecified injury of head, initial encounter: Secondary | ICD-10-CM | POA: Diagnosis not present

## 2020-02-20 LAB — CBC
HCT: 40.1 % (ref 39.0–52.0)
Hemoglobin: 12.8 g/dL — ABNORMAL LOW (ref 13.0–17.0)
MCH: 30.8 pg (ref 26.0–34.0)
MCHC: 31.9 g/dL (ref 30.0–36.0)
MCV: 96.4 fL (ref 80.0–100.0)
Platelets: 239 10*3/uL (ref 150–400)
RBC: 4.16 MIL/uL — ABNORMAL LOW (ref 4.22–5.81)
RDW: 16.5 % — ABNORMAL HIGH (ref 11.5–15.5)
WBC: 12.1 10*3/uL — ABNORMAL HIGH (ref 4.0–10.5)
nRBC: 0 % (ref 0.0–0.2)

## 2020-02-20 LAB — URINALYSIS, ROUTINE W REFLEX MICROSCOPIC
Bilirubin Urine: NEGATIVE
Glucose, UA: NEGATIVE mg/dL
Hgb urine dipstick: NEGATIVE
Ketones, ur: NEGATIVE mg/dL
Leukocytes,Ua: NEGATIVE
Nitrite: NEGATIVE
Protein, ur: NEGATIVE mg/dL
Specific Gravity, Urine: 1.009 (ref 1.005–1.030)
pH: 5 (ref 5.0–8.0)

## 2020-02-20 LAB — BASIC METABOLIC PANEL
Anion gap: 14 (ref 5–15)
BUN: 36 mg/dL — ABNORMAL HIGH (ref 8–23)
CO2: 24 mmol/L (ref 22–32)
Calcium: 9.4 mg/dL (ref 8.9–10.3)
Chloride: 99 mmol/L (ref 98–111)
Creatinine, Ser: 1.87 mg/dL — ABNORMAL HIGH (ref 0.61–1.24)
GFR, Estimated: 37 mL/min — ABNORMAL LOW (ref >60)
Glucose, Bld: 132 mg/dL — ABNORMAL HIGH (ref 70–99)
Potassium: 4 mmol/L (ref 3.5–5.1)
Sodium: 137 mmol/L (ref 135–145)

## 2020-02-20 NOTE — ED Notes (Signed)
Physician to bedside.

## 2020-02-20 NOTE — ED Provider Notes (Signed)
[facility] EMERGENCY DEPARTMENT Provider Note   CSN: [id number] Arrival date & time: 02/19/20  1740     History Chief Complaint  Patient presents with  . Fall    Chad Duke is a 75 y.o. male.  75 year old male on Eliquis brought in by EMS for unwitnessed fall at nursing facility which occurred yesterday at 1545 hrs.  Provider at facility felt like patient was altered compared to baseline and sent for evaluation.  Patient had a head CT on arrival and was sent to the lobby to wait.  Review of nursing notes, while in the lobby, patient had an unwitnessed fall and bystanders assisted patient back to the wheelchair.  At this time, patient states that he fell on his way to the bathroom, he denies any injuries or complaints however is a poor historian.        Past Medical History:  Diagnosis Date  . AAA (abdominal aortic aneurysm) (HCC)   . Allergy    Rhinitis  . Arthritis    Rhuematoid  . Asthma   . CHF (congestive heart failure) (HCC)    chronic diastolic (congestive) heart failure from notes from [facility] face sheet  . Chronic renal insufficiency   . Depression    per notes from [facility] on chart  . Diverticulosis   . Emphysema   . Falls frequently   . GERD (gastroesophageal reflux disease)   . Hearing loss of both ears   . Hiatal hernia   . Hyperlipidemia   . Hypertension   . Male hypogonadism   . Nephrolithiasis   . Nephrolithiasis   . Sleep apnea    On CPAP  . Stasis dermatitis   . Varicose veins   . Vitamin B 12 deficiency     Patient Active Problem List   Diagnosis Date Noted  . Chronic renal disease, stage III (HCC) 01/25/2018  . Weakness 10/03/2017  . Adult failure to thrive 10/03/2017  . Positive D dimer 09/26/2017  . Elevated troponin 09/26/2017  . Personal history of DVT (deep vein thrombosis) 09/26/2017  . COPD with acute exacerbation (HCC) 09/26/2017  . CAD (coronary artery disease) 09/08/2017  . Goals of care,  counseling/discussion   . Palliative care by specialist   . Acute kidney injury superimposed on chronic kidney disease (HCC) 09/02/2017  . Hypokalemia 09/02/2017  . Hypomagnesemia 09/02/2017  . Controlled type 2 diabetes mellitus with hyperglycemia (HCC) 09/02/2017  . Urinary incontinence 09/02/2017  . Fall 08/13/2017  . Acute renal failure superimposed on stage 3 chronic kidney disease (HCC) 08/13/2017  . HLD (hyperlipidemia) 08/13/2017  . Essential hypertension 08/13/2017  . GERD (gastroesophageal reflux disease) 08/13/2017  . Orthostatic hypotension 08/13/2017  . Deep vein thrombosis (DVT) of both lower extremities (HCC)   . Hyperglycemia   . History of pulmonary embolism 07/20/2017  . Acute on chronic diastolic heart failure (HCC) 07/20/2017  . Bilateral leg edema 07/20/2017  . Acute exacerbation of chronic obstructive pulmonary disease (COPD) (HCC) 07/19/2017  . 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 Cigarette smoker 09/08/2016  . Morbid obesity due to excess calories (HCC) 09/08/2016  . Pain in joint, ankle and foot 03/31/2012  . Abnormality of gait 03/31/2012  . Rheumatoid arthritis (HCC) 03/31/2012  . COPD GOLD III  02/12/2011  . Sleep apnea 02/12/2011    Past Surgical History:  Procedure Laterality Date  . CARPAL TUNNEL RELEASE     bilateral  . CYSTOSCOPY/URETEROSCOPY/HOLMIUM LASER/STENT PLACEMENT Left 11/02/2018   Procedure: CYSTOSCOPY/URETEROSCOPY/HOLMIUM LASER/STENT PLACEMENT;  Surgeon: Crista Elliot, MD;  Location: [facility];  Service: Urology;  Laterality: Left;  . FRACTURE SURGERY Left    Fibula  . JOINT REPLACEMENT Left    Shoulder  . JOINT REPLACEMENT Left    Hip   . KIDNEY STONE SURGERY  2003  . left hip replacement    . left knee    . left shoulder    . LITHOTRIPSY         Family History  Problem Relation Age of Onset  . Emphysema Father   . Asthma Father   . Osteoarthritis Father   . Heart attack Mother   . Uterine cancer Mother   .  Heart disease Mother   . ADD / ADHD Son   . Depression Son   . Hypertension Brother   . Heart disease Brother        Pacemaker   . Heart murmur Brother     Social History   Tobacco Use  . Smoking status: Current Every Day Smoker    Packs/day: 2.00    Years: 53.00    Pack years: 106.00    Types: Cigarettes  . Smokeless tobacco: Never Used  . Tobacco comment: currently smoking 1ppd--01/09/2020  Vaping Use  . Vaping Use: Never used  Substance Use Topics  . Alcohol use: Not Currently    Alcohol/week: 0.0 standard drinks  . Drug use: No    Home Medications Prior to Admission medications   Medication Sig Start Date End Date Taking? Authorizing Provider  albuterol (PROVENTIL HFA;VENTOLIN HFA) 108 (90 Base) MCG/ACT inhaler Inhale 2 puffs into the lungs every 4 (four) hours as needed for wheezing or shortness of breath. 07/30/17  Yes Nyoka Cowden, MD  allopurinol (ZYLOPRIM) 100 MG tablet Take 100 mg by mouth daily.   Yes [provider]  amoxicillin-clavulanate (AUGMENTIN) 875-125 MG tablet Take 1 tablet by mouth See admin instructions. Q12h x 7 days.   Yes [provider]  apixaban (ELIQUIS) 5 MG TABS tablet Take 1 tablet (5 mg total) by mouth 2 (two) times daily. 09/04/17  Yes Albertine Grates, MD  Ascorbic Acid (VITAMIN C) 1000 MG tablet Take 1,000 mg by mouth See admin instructions. Q12h x 10 days   Yes [provider]  atorvastatin (LIPITOR) 40 MG tablet Take 1 tablet (40 mg total) by mouth daily. Patient taking differently: Take 40 mg by mouth at bedtime. 05/18/17 10/03/25 Yes Lewayne Bunting, MD  budesonide (PULMICORT) 0.25 MG/2ML nebulizer solution Take 0.25 mg by nebulization 2 (two) times daily.   Yes [provider]  buPROPion (ZYBAN) 150 MG 12 hr tablet Take 1 tablet (150 mg total) by mouth 2 (two) times daily. 01/09/20  Yes Coralyn Helling, MD  CALCIUM-MAGNESIUM-VITAMIN D PO Take 1 tablet by mouth daily.   Yes [provider]   Cholecalciferol (VITAMIN D-3) 125 MCG (5000 UT) TABS Take 5,000 Units by mouth daily.   Yes [provider]  COMBIVENT RESPIMAT 20-100 MCG/ACT AERS respimat Inhale 1 puff into the lungs 4 (four) times daily. 12/16/19  Yes [provider]  dexamethasone (DECADRON) 6 MG tablet Take 6 mg by mouth See admin instructions. Qd x 5 days   Yes [provider]  furosemide (LASIX) 40 MG tablet Take 40 mg by mouth daily.   Yes [provider]  Glucerna (GLUCERNA) LIQD Take 237 mLs by mouth 2 (two) times daily between meals.   Yes [provider]  ipratropium-albuterol (DUONEB) 0.5-2.5 (3) MG/3ML  SOLN Take 3 mLs by nebulization 3 (three) times daily.    Yes [provider]  Magnesium 400 MG CAPS Take 400 mg by mouth at bedtime.    Yes [provider]  meclizine (ANTIVERT) 12.5 MG tablet Take 1 tablet (12.5 mg total) by mouth 3 (three) times daily as needed for dizziness. 01/19/20  Yes Charlynne Pander, MD  metFORMIN (GLUCOPHAGE) 500 MG tablet Take 500 mg by mouth 2 (two) times daily. 05/09/19  Yes [provider]  pantoprazole (PROTONIX) 40 MG tablet Take 40 mg by mouth daily.    Yes [provider]  potassium chloride SA (K-DUR,KLOR-CON) 20 MEQ tablet Take 40 mEq by mouth daily.   Yes [provider]  predniSONE (DELTASONE) 5 MG tablet Take 10 mg by mouth daily as needed (flares).   Yes [provider]  Remdesivir 100 MG/20ML SOLN Inject 100 mg into the vein See admin instructions. 2 day course   Yes [provider]  SYMBICORT 160-4.5 MCG/ACT inhaler Inhale 2 puffs into the lungs in the morning and at bedtime. 01/15/20  Yes [provider]  Zinc 220 (50 Zn) MG CAPS Take 220 mg by mouth See admin instructions. Qd x 10 days   Yes [provider]  cefTRIAXone (ROCEPHIN) 1 g injection Inject 1 g into the muscle once. Patient not taking: Reported on 02/20/2020    [provider]   vitamin C (ASCORBIC ACID) 500 MG tablet Take 1,000 mg by mouth at bedtime. Patient not taking: No sig reported    [provider]    Allergies    Varenicline and Gabapentin  Review of Systems   Review of Systems  Constitutional: Negative for fever.  Respiratory: Negative for shortness of breath.   Cardiovascular: Negative for chest pain.  Gastrointestinal: Negative for abdominal pain and vomiting.  Musculoskeletal: Negative for back pain and neck pain.  Skin: Negative for wound.  Allergic/Immunologic: Positive for immunocompromised state.  Neurological: Negative for headaches.    Physical Exam Updated Vital Signs BP 114/82   Pulse 96   Temp 97.7 F (36.5 C) (Oral)   Resp 18   SpO2 95%   Physical Exam Vitals and nursing note reviewed.  Constitutional:      General: He is not in acute distress.    Appearance: He is well-developed and well-nourished. He is not diaphoretic.  HENT:     Head: Normocephalic and atraumatic.     Nose: Nose normal.     Mouth/Throat:     Mouth: Mucous membranes are dry.  Eyes:     Pupils: Pupils are equal, round, and reactive to light.  Cardiovascular:     Rate and Rhythm: Normal rate and regular rhythm.     Pulses: Normal pulses.     Heart sounds: Normal heart sounds.  Pulmonary:     Effort: Pulmonary effort is normal.     Breath sounds: Normal breath sounds.  Abdominal:     Palpations: Abdomen is soft.     Tenderness: There is no abdominal tenderness.  Musculoskeletal:     Cervical back: Normal range of motion and neck supple. No tenderness.     Right lower leg: No edema.     Left lower leg: No edema.  Skin:    General: Skin is warm and dry.     Findings: No bruising, erythema or rash.  Neurological:     Mental Status: He is alert.     Sensory: No sensory deficit.  Motor: No weakness.     Comments: Oriented to person and place  Psychiatric:        Mood and Affect: Mood and affect normal.        Behavior: Behavior  normal.     ED Results / Procedures / Treatments   Labs (all labs ordered are listed, but only abnormal results are displayed) Labs Reviewed  BASIC METABOLIC PANEL - Abnormal; Notable for the following components:      Result Value   Glucose, Bld 132 (*)    BUN 36 (*)    Creatinine, Ser 1.87 (*)    GFR, Estimated 37 (*)    All other components within normal limits  CBC - Abnormal; Notable for the following components:   WBC 12.1 (*)    RBC 4.16 (*)    Hemoglobin 12.8 (*)    RDW 16.5 (*)    All other components within normal limits  URINALYSIS, ROUTINE W REFLEX MICROSCOPIC - Abnormal; Notable for the following components:   Color, Urine STRAW (*)    All other components within normal limits  CBG MONITORING, ED    EKG EKG Interpretation  Date/Time:  Tuesday February 20 2020 06:07:38 EST Ventricular Rate:  99 PR Interval:    QRS Duration: 150 QT Interval:  381 QTC Calculation: 489 R Axis:   -89 Text Interpretation: Sinus rhythm Right bundle branch block Left anterior fasicular block No significant change since 01/21/2020 Confirmed by Geoffery Lyons ([id number]) on 02/20/2020 6:28:58 AM   Radiology CT Head Wo Contrast  Result Date: 02/20/2020 CLINICAL DATA:  Fall in lobby Head trauma, minor (Age >= 65y) second fall, on Eliquis EXAM: CT HEAD WITHOUT CONTRAST TECHNIQUE: Contiguous axial images were obtained from the base of the skull through the vertex without intravenous contrast. COMPARISON:  None. FINDINGS: Brain: No acute intracranial hemorrhage. No focal mass lesion. No CT evidence of acute infarction. No midline shift or mass effect. No hydrocephalus. Basilar cisterns are patent. There are periventricular and subcortical white matter hypodensities. Generalized cortical atrophy. Vascular: No hyperdense vessel or unexpected calcification. Skull: Normal. Negative for fracture or focal lesion. Sinuses/Orbits: Paranasal sinuses and mastoid air cells are clear. Orbits are clear. Other:  None. IMPRESSION: 1. No intracranial trauma. 2. Atrophy and white matter microvascular disease. 3. No acute findings. Electronically Signed   By: Genevive Bi M.D.   On: 02/20/2020 07:39   CT Head Wo Contrast  Result Date: 02/19/2020 CLINICAL DATA:  75 year old male with head trauma. EXAM: CT HEAD WITHOUT CONTRAST TECHNIQUE: Contiguous axial images were obtained from the base of the skull through the vertex without intravenous contrast. COMPARISON:  Head CT dated 01/21/2020. FINDINGS: Brain: Mild age-related atrophy and chronic microvascular ischemic changes. There is no acute intracranial hemorrhage. No mass effect or midline shift. No extra-axial fluid collection. Vascular: No hyperdense vessel or unexpected calcification. Skull: Normal. Negative for fracture or focal lesion. Sinuses/Orbits: No acute finding. Other: None IMPRESSION: 1. No acute intracranial pathology. 2. Mild age-related atrophy and chronic microvascular ischemic changes. Electronically Signed   By: Elgie Collard M.D.   On: 02/19/2020 21:13    Procedures Procedures (including critical care time)  Medications Ordered in ED Medications - No data to display  ED Course  I have reviewed the triage vital signs and the nursing notes.  Pertinent labs & imaging results that were available during my care of the patient were reviewed by me and considered in my medical decision making (see chart for details).  Clinical Course  as of 02/20/20 0857  Tue Feb 20, 2020  [id number] 75 year old male brought in by EMS from [facility] house skilled nursing facility after found on floor awake and alert by staff yesterday afternoon.  Staff was concerned patient had had a decline in mental status and sent for evaluation.  On arrival, patient had a CT of his head which was unremarkable, labs collected and sent to the lobby to await evaluation).  While in the lobby, patient had an unwitnessed fall, brought to the room alert.  Patient states that he was  getting up to go to the bathroom which caused him to fall.  Patient denies any injuries from either of his falls today and has no complaints at this time.  Patient is alert to person and place, not alert to date.  Patient states that his niece Klas has been helping to him to find a new apartment.  Patient was just recently placed in the skilled nursing facility within the past month.  No focal findings on exam.  Repeat head CT following the second fall is also negative.  Labs without significant findings including CBC, BMP, urinalysis.  Vital signs unremarkable.  Plan is for discharge back to nursing facility. [LM]    Clinical Course User Index [LM] Alden Hipp   MDM Rules/Calculators/A&P                          Final Clinical Impression(s) / ED Diagnoses Final diagnoses:  Fall, initial encounter    Rx / DC Orders ED Discharge Orders    None       Jeannie Fend, PA-C 02/20/20 0857    Milagros Loll, MD 02/20/20 [phone]

## 2020-02-20 NOTE — ED Notes (Addendum)
Pt took a fall in the waiting room unwitnessed by myself. Bystanders assisted patient to wheelchair and taken to room. Dr. Judd Lien aware of fall.

## 2020-02-20 NOTE — ED Notes (Signed)
[facility] called @ [facility], RN called by Marylene Land

## 2020-03-02 ENCOUNTER — Emergency Department (HOSPITAL_COMMUNITY): Payer: Medicare Other

## 2020-03-02 ENCOUNTER — Other Ambulatory Visit: Payer: Self-pay

## 2020-03-02 ENCOUNTER — Inpatient Hospital Stay (HOSPITAL_COMMUNITY)
Admission: EM | Admit: 2020-03-02 | Discharge: 2020-03-15 | DRG: 871 | Disposition: A | Discharge status: Skilled Nursing Facility | Payer: Medicare Other | Attending: Internal Medicine | Admitting: Internal Medicine

## 2020-03-02 ENCOUNTER — Encounter (HOSPITAL_COMMUNITY): Payer: Self-pay | Admitting: Emergency Medicine

## 2020-03-02 DIAGNOSIS — R6520 Severe sepsis without septic shock: Secondary | ICD-10-CM | POA: Diagnosis not present

## 2020-03-02 DIAGNOSIS — Z96612 Presence of left artificial shoulder joint: Secondary | ICD-10-CM | POA: Diagnosis present

## 2020-03-02 DIAGNOSIS — I5032 Chronic diastolic (congestive) heart failure: Secondary | ICD-10-CM | POA: Diagnosis present

## 2020-03-02 DIAGNOSIS — L89626 Pressure-induced deep tissue damage of left heel: Secondary | ICD-10-CM

## 2020-03-02 DIAGNOSIS — I517 Cardiomegaly: Secondary | ICD-10-CM | POA: Diagnosis not present

## 2020-03-02 DIAGNOSIS — K1120 Sialoadenitis, unspecified: Secondary | ICD-10-CM | POA: Diagnosis not present

## 2020-03-02 DIAGNOSIS — I714 Abdominal aortic aneurysm, without rupture: Secondary | ICD-10-CM | POA: Diagnosis present

## 2020-03-02 DIAGNOSIS — G4733 Obstructive sleep apnea (adult) (pediatric): Secondary | ICD-10-CM | POA: Diagnosis present

## 2020-03-02 DIAGNOSIS — R4182 Altered mental status, unspecified: Secondary | ICD-10-CM

## 2020-03-02 DIAGNOSIS — N179 Acute kidney failure, unspecified: Secondary | ICD-10-CM | POA: Diagnosis not present

## 2020-03-02 DIAGNOSIS — I2510 Atherosclerotic heart disease of native coronary artery without angina pectoris: Secondary | ICD-10-CM | POA: Diagnosis present

## 2020-03-02 DIAGNOSIS — S0990XA Unspecified injury of head, initial encounter: Secondary | ICD-10-CM | POA: Diagnosis not present

## 2020-03-02 DIAGNOSIS — R1310 Dysphagia, unspecified: Secondary | ICD-10-CM | POA: Diagnosis present

## 2020-03-02 DIAGNOSIS — E860 Dehydration: Secondary | ICD-10-CM | POA: Diagnosis present

## 2020-03-02 DIAGNOSIS — R220 Localized swelling, mass and lump, head: Secondary | ICD-10-CM | POA: Diagnosis not present

## 2020-03-02 DIAGNOSIS — H9193 Unspecified hearing loss, bilateral: Secondary | ICD-10-CM | POA: Diagnosis present

## 2020-03-02 DIAGNOSIS — Z86711 Personal history of pulmonary embolism: Secondary | ICD-10-CM | POA: Diagnosis not present

## 2020-03-02 DIAGNOSIS — E1165 Type 2 diabetes mellitus with hyperglycemia: Secondary | ICD-10-CM | POA: Diagnosis present

## 2020-03-02 DIAGNOSIS — D696 Thrombocytopenia, unspecified: Secondary | ICD-10-CM | POA: Diagnosis not present

## 2020-03-02 DIAGNOSIS — Y95 Nosocomial condition: Secondary | ICD-10-CM | POA: Diagnosis not present

## 2020-03-02 DIAGNOSIS — J9611 Chronic respiratory failure with hypoxia: Secondary | ICD-10-CM | POA: Diagnosis not present

## 2020-03-02 DIAGNOSIS — S0302XA Dislocation of jaw, left side, initial encounter: Secondary | ICD-10-CM | POA: Diagnosis not present

## 2020-03-02 DIAGNOSIS — Z9981 Dependence on supplemental oxygen: Secondary | ICD-10-CM

## 2020-03-02 DIAGNOSIS — I6782 Cerebral ischemia: Secondary | ICD-10-CM | POA: Diagnosis not present

## 2020-03-02 DIAGNOSIS — R0603 Acute respiratory distress: Secondary | ICD-10-CM

## 2020-03-02 DIAGNOSIS — Z818 Family history of other mental and behavioral disorders: Secondary | ICD-10-CM

## 2020-03-02 DIAGNOSIS — R000 Tachycardia, unspecified: Secondary | ICD-10-CM | POA: Diagnosis not present

## 2020-03-02 DIAGNOSIS — J189 Pneumonia, unspecified organism: Secondary | ICD-10-CM | POA: Diagnosis not present

## 2020-03-02 DIAGNOSIS — I130 Hypertensive heart and chronic kidney disease with heart failure and stage 1 through stage 4 chronic kidney disease, or unspecified chronic kidney disease: Secondary | ICD-10-CM | POA: Diagnosis not present

## 2020-03-02 DIAGNOSIS — R0902 Hypoxemia: Secondary | ICD-10-CM | POA: Diagnosis not present

## 2020-03-02 DIAGNOSIS — K1121 Acute sialoadenitis: Secondary | ICD-10-CM | POA: Diagnosis present

## 2020-03-02 DIAGNOSIS — J439 Emphysema, unspecified: Secondary | ICD-10-CM | POA: Diagnosis present

## 2020-03-02 DIAGNOSIS — R0689 Other abnormalities of breathing: Secondary | ICD-10-CM | POA: Diagnosis not present

## 2020-03-02 DIAGNOSIS — J449 Chronic obstructive pulmonary disease, unspecified: Secondary | ICD-10-CM | POA: Diagnosis not present

## 2020-03-02 DIAGNOSIS — E1122 Type 2 diabetes mellitus with diabetic chronic kidney disease: Secondary | ICD-10-CM | POA: Diagnosis present

## 2020-03-02 DIAGNOSIS — Z96642 Presence of left artificial hip joint: Secondary | ICD-10-CM | POA: Diagnosis present

## 2020-03-02 DIAGNOSIS — Z825 Family history of asthma and other chronic lower respiratory diseases: Secondary | ICD-10-CM

## 2020-03-02 DIAGNOSIS — Z781 Physical restraint status: Secondary | ICD-10-CM

## 2020-03-02 DIAGNOSIS — D631 Anemia in chronic kidney disease: Secondary | ICD-10-CM | POA: Diagnosis present

## 2020-03-02 DIAGNOSIS — Z8049 Family history of malignant neoplasm of other genital organs: Secondary | ICD-10-CM

## 2020-03-02 DIAGNOSIS — N183 Chronic kidney disease, stage 3 unspecified: Secondary | ICD-10-CM | POA: Diagnosis present

## 2020-03-02 DIAGNOSIS — N1831 Chronic kidney disease, stage 3a: Secondary | ICD-10-CM | POA: Diagnosis present

## 2020-03-02 DIAGNOSIS — R402421 Glasgow coma scale score 9-12, in the field [EMT or ambulance]: Secondary | ICD-10-CM | POA: Diagnosis not present

## 2020-03-02 DIAGNOSIS — E785 Hyperlipidemia, unspecified: Secondary | ICD-10-CM | POA: Diagnosis present

## 2020-03-02 DIAGNOSIS — M069 Rheumatoid arthritis, unspecified: Secondary | ICD-10-CM | POA: Diagnosis present

## 2020-03-02 DIAGNOSIS — K219 Gastro-esophageal reflux disease without esophagitis: Secondary | ICD-10-CM | POA: Diagnosis present

## 2020-03-02 DIAGNOSIS — Z66 Do not resuscitate: Secondary | ICD-10-CM | POA: Diagnosis not present

## 2020-03-02 DIAGNOSIS — I4510 Unspecified right bundle-branch block: Secondary | ICD-10-CM | POA: Diagnosis not present

## 2020-03-02 DIAGNOSIS — I452 Bifascicular block: Secondary | ICD-10-CM | POA: Diagnosis not present

## 2020-03-02 DIAGNOSIS — D649 Anemia, unspecified: Secondary | ICD-10-CM | POA: Diagnosis not present

## 2020-03-02 DIAGNOSIS — A419 Sepsis, unspecified organism: Principal | ICD-10-CM | POA: Diagnosis present

## 2020-03-02 DIAGNOSIS — R404 Transient alteration of awareness: Secondary | ICD-10-CM | POA: Diagnosis not present

## 2020-03-02 DIAGNOSIS — G9341 Metabolic encephalopathy: Secondary | ICD-10-CM | POA: Diagnosis not present

## 2020-03-02 DIAGNOSIS — U071 COVID-19: Secondary | ICD-10-CM | POA: Diagnosis present

## 2020-03-02 DIAGNOSIS — E870 Hyperosmolality and hypernatremia: Secondary | ICD-10-CM | POA: Diagnosis not present

## 2020-03-02 DIAGNOSIS — Z86718 Personal history of other venous thrombosis and embolism: Secondary | ICD-10-CM

## 2020-03-02 DIAGNOSIS — R0600 Dyspnea, unspecified: Secondary | ICD-10-CM | POA: Diagnosis not present

## 2020-03-02 DIAGNOSIS — Z6833 Body mass index (BMI) 33.0-33.9, adult: Secondary | ICD-10-CM

## 2020-03-02 DIAGNOSIS — Z7951 Long term (current) use of inhaled steroids: Secondary | ICD-10-CM

## 2020-03-02 DIAGNOSIS — Z8249 Family history of ischemic heart disease and other diseases of the circulatory system: Secondary | ICD-10-CM

## 2020-03-02 DIAGNOSIS — Z888 Allergy status to other drugs, medicaments and biological substances status: Secondary | ICD-10-CM

## 2020-03-02 DIAGNOSIS — G4730 Sleep apnea, unspecified: Secondary | ICD-10-CM | POA: Diagnosis present

## 2020-03-02 DIAGNOSIS — E876 Hypokalemia: Secondary | ICD-10-CM | POA: Diagnosis not present

## 2020-03-02 DIAGNOSIS — G319 Degenerative disease of nervous system, unspecified: Secondary | ICD-10-CM | POA: Diagnosis not present

## 2020-03-02 DIAGNOSIS — S199XXA Unspecified injury of neck, initial encounter: Secondary | ICD-10-CM | POA: Diagnosis not present

## 2020-03-02 DIAGNOSIS — M109 Gout, unspecified: Secondary | ICD-10-CM | POA: Diagnosis present

## 2020-03-02 DIAGNOSIS — Z7984 Long term (current) use of oral hypoglycemic drugs: Secondary | ICD-10-CM

## 2020-03-02 DIAGNOSIS — Z7901 Long term (current) use of anticoagulants: Secondary | ICD-10-CM

## 2020-03-02 DIAGNOSIS — Z79899 Other long term (current) drug therapy: Secondary | ICD-10-CM

## 2020-03-02 DIAGNOSIS — M50322 Other cervical disc degeneration at C5-C6 level: Secondary | ICD-10-CM | POA: Diagnosis not present

## 2020-03-02 LAB — CBC WITH DIFFERENTIAL/PLATELET
Abs Immature Granulocytes: 0.44 10*3/uL — ABNORMAL HIGH (ref 0.00–0.07)
Basophils Absolute: 0.1 10*3/uL (ref 0.0–0.1)
Basophils Relative: 0 %
Eosinophils Absolute: 0 10*3/uL (ref 0.0–0.5)
Eosinophils Relative: 0 %
HCT: 44.8 % (ref 39.0–52.0)
Hemoglobin: 14.3 g/dL (ref 13.0–17.0)
Immature Granulocytes: 2 %
Lymphocytes Relative: 12 %
Lymphs Abs: 3 10*3/uL (ref 0.7–4.0)
MCH: 31.2 pg (ref 26.0–34.0)
MCHC: 31.9 g/dL (ref 30.0–36.0)
MCV: 97.6 fL (ref 80.0–100.0)
Monocytes Absolute: 2.3 10*3/uL — ABNORMAL HIGH (ref 0.1–1.0)
Monocytes Relative: 10 %
Neutro Abs: 18.3 10*3/uL — ABNORMAL HIGH (ref 1.7–7.7)
Neutrophils Relative %: 76 %
Platelets: 191 10*3/uL (ref 150–400)
RBC: 4.59 MIL/uL (ref 4.22–5.81)
RDW: 17.5 % — ABNORMAL HIGH (ref 11.5–15.5)
WBC: 24 10*3/uL — ABNORMAL HIGH (ref 4.0–10.5)
nRBC: 0 % (ref 0.0–0.2)

## 2020-03-02 LAB — COMPREHENSIVE METABOLIC PANEL
ALT: 26 U/L (ref 0–44)
ALT: 21 U/L (ref 0–44)
AST: 28 U/L (ref 15–41)
AST: 25 U/L (ref 15–41)
Albumin: 2.4 g/dL — ABNORMAL LOW (ref 3.5–5.0)
Albumin: 1.9 g/dL — ABNORMAL LOW (ref 3.5–5.0)
Alkaline Phosphatase: 102 U/L (ref 38–126)
Alkaline Phosphatase: 81 U/L (ref 38–126)
Anion gap: 14 (ref 5–15)
Anion gap: 11 (ref 5–15)
BUN: 49 mg/dL — ABNORMAL HIGH (ref 8–23)
BUN: 48 mg/dL — ABNORMAL HIGH (ref 8–23)
CO2: 24 mmol/L (ref 22–32)
CO2: 22 mmol/L (ref 22–32)
Calcium: 9.2 mg/dL (ref 8.9–10.3)
Calcium: 8 mg/dL — ABNORMAL LOW (ref 8.9–10.3)
Chloride: 108 mmol/L (ref 98–111)
Chloride: 114 mmol/L — ABNORMAL HIGH (ref 98–111)
Creatinine, Ser: 3.22 mg/dL — ABNORMAL HIGH (ref 0.61–1.24)
Creatinine, Ser: 2.72 mg/dL — ABNORMAL HIGH (ref 0.61–1.24)
GFR, Estimated: 19 mL/min — ABNORMAL LOW (ref >60)
GFR, Estimated: 24 mL/min — ABNORMAL LOW (ref >60)
Glucose, Bld: 146 mg/dL — ABNORMAL HIGH (ref 70–99)
Glucose, Bld: 145 mg/dL — ABNORMAL HIGH (ref 70–99)
Potassium: 4.7 mmol/L (ref 3.5–5.1)
Potassium: 4.1 mmol/L (ref 3.5–5.1)
Sodium: 146 mmol/L — ABNORMAL HIGH (ref 135–145)
Sodium: 147 mmol/L — ABNORMAL HIGH (ref 135–145)
Total Bilirubin: 1.5 mg/dL — ABNORMAL HIGH (ref 0.3–1.2)
Total Bilirubin: 1.4 mg/dL — ABNORMAL HIGH (ref 0.3–1.2)
Total Protein: 7.6 g/dL (ref 6.5–8.1)
Total Protein: 5.7 g/dL — ABNORMAL LOW (ref 6.5–8.1)

## 2020-03-02 LAB — URINALYSIS, ROUTINE W REFLEX MICROSCOPIC
Bilirubin Urine: NEGATIVE
Glucose, UA: NEGATIVE mg/dL
Ketones, ur: NEGATIVE mg/dL
Leukocytes,Ua: NEGATIVE
Nitrite: NEGATIVE
Protein, ur: NEGATIVE mg/dL
Specific Gravity, Urine: 1.014 (ref 1.005–1.030)
pH: 5 (ref 5.0–8.0)

## 2020-03-02 LAB — I-STAT ARTERIAL BLOOD GAS, ED
Acid-base deficit: 1 mmol/L (ref 0.0–2.0)
Bicarbonate: 22.9 mmol/L (ref 20.0–28.0)
Calcium, Ion: 1.21 mmol/L (ref 1.15–1.40)
HCT: 37 % — ABNORMAL LOW (ref 39.0–52.0)
Hemoglobin: 12.6 g/dL — ABNORMAL LOW (ref 13.0–17.0)
O2 Saturation: 95 %
Potassium: 4.1 mmol/L (ref 3.5–5.1)
Sodium: 146 mmol/L — ABNORMAL HIGH (ref 135–145)
TCO2: 24 mmol/L (ref 22–32)
pCO2 arterial: 34.4 mmHg (ref 32.0–48.0)
pH, Arterial: 7.431 (ref 7.350–7.450)
pO2, Arterial: 71 mmHg — ABNORMAL LOW (ref 83.0–108.0)

## 2020-03-02 LAB — SARS CORONAVIRUS 2 BY RT PCR (HOSPITAL ORDER, PERFORMED IN ~~LOC~~ HOSPITAL LAB): SARS Coronavirus 2: POSITIVE — AB

## 2020-03-02 LAB — TROPONIN I (HIGH SENSITIVITY): Troponin I (High Sensitivity): 81 ng/L — ABNORMAL HIGH (ref <18)

## 2020-03-02 LAB — TYPE AND SCREEN
ABO/RH(D): A POS
Antibody Screen: NEGATIVE

## 2020-03-02 LAB — LACTIC ACID, PLASMA
Lactic Acid, Venous: 2.1 mmol/L (ref 0.5–1.9)
Lactic Acid, Venous: 1.7 mmol/L (ref 0.5–1.9)

## 2020-03-02 LAB — AMMONIA: Ammonia: 18 umol/L (ref 9–35)

## 2020-03-02 LAB — PROTIME-INR
INR: 1.8 — ABNORMAL HIGH (ref 0.8–1.2)
Prothrombin Time: 19.9 seconds — ABNORMAL HIGH (ref 11.4–15.2)

## 2020-03-02 LAB — CK: Total CK: 117 U/L (ref 49–397)

## 2020-03-02 LAB — LIPASE, BLOOD: Lipase: 22 U/L (ref 11–51)

## 2020-03-02 MED ORDER — ONDANSETRON HCL 4 MG/2ML IJ SOLN: 4.0000 mg | Freq: Four times a day (QID) | INTRAMUSCULAR | Status: DC | PRN

## 2020-03-02 MED ORDER — SODIUM CHLORIDE 0.9 % IV SOLN
3.0000 g | Freq: Two times a day (BID) | INTRAVENOUS | Status: DC
  Administered 2020-03-02 – 2020-03-04 (×4): 3 g via INTRAVENOUS
  Filled 2020-03-02 (×4): qty 8
  Filled 2020-03-02: qty 3

## 2020-03-02 MED ORDER — DEXAMETHASONE SODIUM PHOSPHATE 10 MG/ML IJ SOLN
8.0000 mg | Freq: Once | INTRAMUSCULAR | Status: AC
  Administered 2020-03-02: 8 mg via INTRAVENOUS
  Filled 2020-03-02: qty 1

## 2020-03-02 MED ORDER — VANCOMYCIN HCL 2000 MG/400ML IV SOLN
2000.0000 mg | Freq: Once | INTRAVENOUS | Status: AC
  Administered 2020-03-02: 2000 mg via INTRAVENOUS
  Filled 2020-03-02: qty 400

## 2020-03-02 MED ORDER — SODIUM CHLORIDE 0.9 % IV BOLUS
1000.0000 mL | Freq: Once | INTRAVENOUS | Status: AC
  Administered 2020-03-02: 1000 mL via INTRAVENOUS

## 2020-03-02 MED ORDER — ALBUTEROL SULFATE HFA 108 (90 BASE) MCG/ACT IN AERS
1.0000 | INHALATION_SPRAY | RESPIRATORY_TRACT | Status: DC | PRN
  Administered 2020-03-12 – 2020-03-14 (×3): 1 via RESPIRATORY_TRACT
  Filled 2020-03-02: qty 6.7

## 2020-03-02 MED ORDER — MORPHINE SULFATE (PF) 2 MG/ML IV SOLN: 2.0000 mg | INTRAVENOUS | Status: DC | PRN

## 2020-03-02 MED ORDER — ALBUTEROL SULFATE (2.5 MG/3ML) 0.083% IN NEBU: 2.5000 mg | INHALATION_SOLUTION | RESPIRATORY_TRACT | Status: DC | PRN

## 2020-03-02 MED ORDER — VANCOMYCIN HCL IN DEXTROSE 1-5 GM/200ML-% IV SOLN: 1000.0000 mg | Freq: Once | INTRAVENOUS | Status: DC

## 2020-03-02 MED ORDER — VANCOMYCIN VARIABLE DOSE PER UNSTABLE RENAL FUNCTION (PHARMACIST DOSING): Status: DC

## 2020-03-02 MED ORDER — IPRATROPIUM-ALBUTEROL 0.5-2.5 (3) MG/3ML IN SOLN: 3.0000 mL | Freq: Four times a day (QID) | RESPIRATORY_TRACT | Status: DC

## 2020-03-02 MED ORDER — ACETAMINOPHEN 325 MG PO TABS: 650.0000 mg | ORAL_TABLET | Freq: Four times a day (QID) | ORAL | Status: DC | PRN

## 2020-03-02 MED ORDER — SODIUM CHLORIDE 0.9 % IV SOLN
2.0000 g | Freq: Once | INTRAVENOUS | Status: AC
  Administered 2020-03-02: 2 g via INTRAVENOUS
  Filled 2020-03-02: qty 2

## 2020-03-02 MED ORDER — ONDANSETRON HCL 4 MG PO TABS: 4.0000 mg | ORAL_TABLET | Freq: Four times a day (QID) | ORAL | Status: DC | PRN

## 2020-03-02 MED ORDER — TRAMADOL HCL 50 MG PO TABS: 50.0000 mg | ORAL_TABLET | Freq: Four times a day (QID) | ORAL | Status: DC | PRN

## 2020-03-02 MED ORDER — DEXAMETHASONE SODIUM PHOSPHATE 10 MG/ML IJ SOLN: 8.0000 mg | Freq: Four times a day (QID) | INTRAMUSCULAR | Status: DC

## 2020-03-02 MED ORDER — SODIUM CHLORIDE 0.9 % IV SOLN: INTRAVENOUS | Status: DC

## 2020-03-02 MED ORDER — ACETAMINOPHEN 650 MG RE SUPP
650.0000 mg | Freq: Once | RECTAL | Status: AC
  Administered 2020-03-02: 650 mg via RECTAL
  Filled 2020-03-02: qty 1

## 2020-03-02 MED ORDER — METRONIDAZOLE IN NACL 5-0.79 MG/ML-% IV SOLN: 500.0000 mg | Freq: Three times a day (TID) | INTRAVENOUS | Status: DC

## 2020-03-02 MED ORDER — BUDESONIDE 0.25 MG/2ML IN SUSP: 0.2500 mg | Freq: Two times a day (BID) | RESPIRATORY_TRACT | Status: DC

## 2020-03-02 MED ORDER — DEXAMETHASONE SODIUM PHOSPHATE 10 MG/ML IJ SOLN
8.0000 mg | Freq: Three times a day (TID) | INTRAMUSCULAR | Status: AC
  Administered 2020-03-02 – 2020-03-03 (×3): 8 mg via INTRAVENOUS
  Filled 2020-03-02 (×3): qty 1

## 2020-03-02 MED ORDER — ENOXAPARIN SODIUM 100 MG/ML ~~LOC~~ SOLN
1.0000 mg/kg | SUBCUTANEOUS | Status: DC
  Administered 2020-03-02 – 2020-03-03 (×2): 100 mg via SUBCUTANEOUS
  Filled 2020-03-02 (×2): qty 1

## 2020-03-02 MED ORDER — IPRATROPIUM-ALBUTEROL 20-100 MCG/ACT IN AERS
1.0000 | INHALATION_SPRAY | Freq: Four times a day (QID) | RESPIRATORY_TRACT | Status: DC
  Administered 2020-03-02 – 2020-03-05 (×13): 1 via RESPIRATORY_TRACT
  Filled 2020-03-02: qty 4

## 2020-03-02 MED ORDER — BUDESONIDE 180 MCG/ACT IN AEPB
1.0000 | INHALATION_SPRAY | Freq: Two times a day (BID) | RESPIRATORY_TRACT | Status: DC
  Administered 2020-03-02 – 2020-03-13 (×20): 1 via RESPIRATORY_TRACT
  Filled 2020-03-02 (×3): qty 1

## 2020-03-02 MED ORDER — SODIUM CHLORIDE 0.9 % IV SOLN: 2.0000 g | Freq: Once | INTRAVENOUS | Status: DC

## 2020-03-02 MED ORDER — SODIUM CHLORIDE 0.9 % IV SOLN: 2.0000 g | INTRAVENOUS | Status: DC

## 2020-03-02 MED ORDER — ACETAMINOPHEN 650 MG RE SUPP: 650.0000 mg | Freq: Four times a day (QID) | RECTAL | Status: DC | PRN

## 2020-03-02 NOTE — ED Notes (Signed)
Vanc not available,  Cefepime started

## 2020-03-02 NOTE — Consult Note (Addendum)
ENT CONSULT:  Reason for Consult: Left facial swelling  Referring Physician:  Dr. Rodena Medin   HPI: Chad Duke is an 75 y.o. male anticoagulated on Eliquis who presents from [facility] s/p fall on 01/19. While in ED, he was noted to be dehydrated, with elevated creatinine, very dry oral mucosa and left facial swelling and erythema. History obtained from chart review and ED provider, as patient was non-compliant at time of my assessment and was unable to answer questions appropriately due to altered mental status. CT neck obtained in ED demonstrated significant edema of the left parotid gland consistent with acute parotitis, as well as "Anterior dislocation of the left mandibular condyle from the temporomandibular joint with extensive overlying soft tissue swelling." ENT was consulted to evaluate.    Past Medical History:  Diagnosis Date  . AAA (abdominal aortic aneurysm) (HCC)   . Allergy    Rhinitis  . Arthritis    Rhuematoid  . Asthma   . CHF (congestive heart failure) (HCC)    chronic diastolic (congestive) heart failure from notes from [facility] face sheet  . Chronic renal insufficiency   . Depression    per notes from [facility] on chart  . Diverticulosis   . Emphysema   . Falls frequently   . GERD (gastroesophageal reflux disease)   . Hearing loss of both ears   . Hiatal hernia   . Hyperlipidemia   . Hypertension   . Male hypogonadism   . Nephrolithiasis   . Nephrolithiasis   . Sleep apnea    On CPAP  . Stasis dermatitis   . Varicose veins   . Vitamin B 12 deficiency     Past Surgical History:  Procedure Laterality Date  . CARPAL TUNNEL RELEASE     bilateral  . CYSTOSCOPY/URETEROSCOPY/HOLMIUM LASER/STENT PLACEMENT Left 11/02/2018   Procedure: CYSTOSCOPY/URETEROSCOPY/HOLMIUM LASER/STENT PLACEMENT;  Surgeon: Crista Elliot, MD;  Location: [facility];  Service: Urology;  Laterality: Left;  . FRACTURE SURGERY Left    Fibula  . JOINT REPLACEMENT Left     Shoulder  . JOINT REPLACEMENT Left    Hip   . KIDNEY STONE SURGERY  2003  . left hip replacement    . left knee    . left shoulder    . LITHOTRIPSY      Family History  Problem Relation Age of Onset  . Emphysema Father   . Asthma Father   . Osteoarthritis Father   . Heart attack Mother   . Uterine cancer Mother   . Heart disease Mother   . ADD / ADHD Son   . Depression Son   . Hypertension Brother   . Heart disease Brother        Pacemaker   . Heart murmur Brother     Social History:  reports that he has been smoking cigarettes. He has a 106.00 pack-year smoking history. He has never used smokeless tobacco. He reports previous alcohol use. He reports that he does not use drugs.  Allergies:  Allergies  Allergen Reactions  . Varenicline Other (See Comments)    Suicidal thoughts Bad dreams Other reaction(s): Other (See Comments) Suicidal thoughts  . Gabapentin Other (See Comments)    dizziness    Medications: I have reviewed the patient's current medications.  Results for orders placed or performed during the hospital encounter of 03/02/20 (from the past 48 hour(s))  Culture, blood (routine x 2)     Status: None (Preliminary result)   Collection Time: 03/02/20  11:43 AM   Specimen: BLOOD  Result Value Ref Range   Specimen Description BLOOD RIGHT ANTECUBITAL    Special Requests      BOTTLES DRAWN AEROBIC AND ANAEROBIC Blood Culture adequate volume   Culture      NO GROWTH < 12 HOURS Performed at [facility], [address], [place], [place] [address]    Report Status PENDING   Urinalysis, Routine w reflex microscopic     Status: Abnormal   Collection Time: 03/02/20 11:43 AM  Result Value Ref Range   Color, Urine AMBER (A) YELLOW    Comment: BIOCHEMICALS MAY BE AFFECTED BY COLOR   APPearance HAZY (A) CLEAR   Specific Gravity, Urine 1.014 1.005 - 1.030   pH 5.0 5.0 - 8.0   Glucose, UA NEGATIVE NEGATIVE mg/dL   Hgb urine dipstick MODERATE (A) NEGATIVE    Bilirubin Urine NEGATIVE NEGATIVE   Ketones, ur NEGATIVE NEGATIVE mg/dL   Protein, ur NEGATIVE NEGATIVE mg/dL   Nitrite NEGATIVE NEGATIVE   Leukocytes,Ua NEGATIVE NEGATIVE   RBC / HPF 21-50 0 - 5 RBC/hpf   WBC, UA 0-5 0 - 5 WBC/hpf   Bacteria, UA RARE (A) NONE SEEN   Squamous Epithelial / LPF 0-5 0 - 5   Mucus PRESENT    Hyaline Casts, UA PRESENT     Comment: Performed at [facility], [address] [place], [place] [address]  SARS Coronavirus 2 by RT PCR (hospital order, performed in [facility] hospital lab) Nasopharyngeal Nasopharyngeal Swab     Status: Abnormal   Collection Time: 03/02/20 11:46 AM   Specimen: Nasopharyngeal Swab  Result Value Ref Range   SARS Coronavirus 2 POSITIVE (A) NEGATIVE    Comment: RESULT CALLED TO, READ BACK BY AND VERIFIED WITH: RN Colonel Bald [phone] AT 1405 BY CM (NOTE) SARS-CoV-2 target nucleic acids are DETECTED  SARS-CoV-2 RNA is generally detectable in upper respiratory specimens  during the acute phase of infection.  Positive results are indicative  of the presence of the identified virus, but do not rule out bacterial infection or co-infection with other pathogens not detected by the test.  Clinical correlation with patient history and  other diagnostic information is necessary to determine patient infection status.  The expected result is negative.  Fact Sheet for Patients:   BoilerBrush.com.cy   Fact Sheet for Healthcare Providers:   https://pope.com/    This test is not yet approved or cleared by the [place] FDA and  has been authorized for detection and/or diagnosis of SARS-CoV-2 by FDA under an Emergency Use Authorization (EUA).  This EUA will remain in effect (meaning this test c an be used) for the duration of  the COVID-19 declaration under Section 564(b)(1) of the Act, 21 U.S.C. section 360-bbb-3(b)(1), unless the authorization is terminated or revoked  sooner.  Performed at [facility], [address], [place], [place] [address]   Comprehensive metabolic panel     Status: Abnormal   Collection Time: 03/02/20 11:55 AM  Result Value Ref Range   Sodium 146 (H) 135 - 145 mmol/L   Potassium 4.7 3.5 - 5.1 mmol/L   Chloride 108 98 - 111 mmol/L   CO2 24 22 - 32 mmol/L   Glucose, Bld 146 (H) 70 - 99 mg/dL    Comment: Glucose reference range applies only to samples taken after fasting for at least 8 hours.   BUN 49 (H) 8 - 23 mg/dL   Creatinine, Ser 9.56 (  H) 0.61 - 1.24 mg/dL   Calcium 9.2 8.9 - 16.1 mg/dL   Total Protein 7.6 6.5 - 8.1 g/dL   Albumin 2.4 (L) 3.5 - 5.0 g/dL   AST 28 15 - 41 U/L   ALT 26 0 - 44 U/L   Alkaline Phosphatase 102 38 - 126 U/L   Total Bilirubin 1.5 (H) 0.3 - 1.2 mg/dL   GFR, Estimated 19 (L) >60 mL/min    Comment: (NOTE) Calculated using the CKD-EPI Creatinine Equation (2021)    Anion gap 14 5 - 15    Comment: Performed at [facility], [address], [place], [place] [address]  Lipase, blood     Status: None   Collection Time: 03/02/20 11:55 AM  Result Value Ref Range   Lipase 22 11 - 51 U/L    Comment: Performed at [facility], [address] [place], [place] [address]  Troponin I (High Sensitivity)     Status: Abnormal   Collection Time: 03/02/20 11:55 AM  Result Value Ref Range   Troponin I (High Sensitivity) 81 (H) <18 ng/L    Comment: (NOTE) Elevated high sensitivity troponin I (hsTnI) values and significant  changes across serial measurements may suggest ACS but many other  chronic and acute conditions are known to elevate hsTnI results.  Refer to the "Links" section for chest pain algorithms and additional  guidance. Performed at [facility], [address], [place], [place] [address]   Lactic acid, plasma     Status: Abnormal   Collection Time: 03/02/20 11:55 AM  Result Value Ref Range   Lactic Acid, Venous 2.1 (HH) 0.5 - 1.9 mmol/L    Comment: CRITICAL RESULT  CALLED TO, READ BACK BY AND VERIFIED WITH: CHRISLYN KING [email or web address]  ON 1.22.22 BY TCALDWELL MT. Performed at [facility], [address], [place], [place] [address]   CBC with Differential     Status: Abnormal   Collection Time: 03/02/20 11:55 AM  Result Value Ref Range   WBC 24.0 (H) 4.0 - 10.5 K/uL   RBC 4.59 4.22 - 5.81 MIL/uL   Hemoglobin 14.3 13.0 - 17.0 g/dL   HCT 62.1 30.8 - 65.7 %   MCV 97.6 80.0 - 100.0 fL   MCH 31.2 26.0 - 34.0 pg   MCHC 31.9 30.0 - 36.0 g/dL   RDW 84.6 (H) 96.2 - 95.2 %   Platelets 191 150 - 400 K/uL   nRBC 0.0 0.0 - 0.2 %   Neutrophils Relative % 76 %   Neutro Abs 18.3 (H) 1.7 - 7.7 K/uL   Lymphocytes Relative 12 %   Lymphs Abs 3.0 0.7 - 4.0 K/uL   Monocytes Relative 10 %   Monocytes Absolute 2.3 (H) 0.1 - 1.0 K/uL   Eosinophils Relative 0 %   Eosinophils Absolute 0.0 0.0 - 0.5 K/uL   Basophils Relative 0 %   Basophils Absolute 0.1 0.0 - 0.1 K/uL   Immature Granulocytes 2 %   Abs Immature Granulocytes 0.44 (H) 0.00 - 0.07 K/uL    Comment: Performed at [facility], [address], [place], [place] [address]  Protime-INR     Status: Abnormal   Collection Time: 03/02/20 11:55 AM  Result Value Ref Range   Prothrombin Time 19.9 (H) 11.4 - 15.2 seconds   INR 1.8 (H) 0.8 - 1.2    Comment: (NOTE) INR goal varies based on device and disease states. Performed at [facility], [address] [place], [place] [address]   Ammonia     Status: None   Collection Time: 03/02/20 11:55 AM  Result Value Ref Range   Ammonia 18 9 - 35 umol/L    Comment: Performed at [facility], [address], [place], [place] [address]  CK     Status: None   Collection Time: 03/02/20 11:55 AM  Result Value Ref Range   Total CK 117 49 - 397 U/L    Comment: Performed at [facility], [address] [place], [place] [address]  I-Stat arterial blood gas, ED     Status: Abnormal   Collection Time: 03/02/20  1:55 PM  Result Value Ref Range   pH,  Arterial 7.431 7.350 - 7.450   pCO2 arterial 34.4 32.0 - 48.0 mmHg   pO2, Arterial 71 (L) 83.0 - 108.0 mmHg   Bicarbonate 22.9 20.0 - 28.0 mmol/L   TCO2 24 22 - 32 mmol/L   O2 Saturation 95.0 %   Acid-base deficit 1.0 0.0 - 2.0 mmol/L   Sodium 146 (H) 135 - 145 mmol/L   Potassium 4.1 3.5 - 5.1 mmol/L   Calcium, Ion 1.21 1.15 - 1.40 mmol/L   HCT 37.0 (L) 39.0 - 52.0 %   Hemoglobin 12.6 (L) 13.0 - 17.0 g/dL   Sample type ARTERIAL     CT Head Wo Contrast  Result Date: 03/02/2020 CLINICAL DATA:  "Head trauma, minor". EXAM: CT HEAD WITHOUT CONTRAST TECHNIQUE: Contiguous axial images were obtained from the base of the skull through the vertex without intravenous contrast. COMPARISON:  02/20/2020. FINDINGS: Brain: Advanced cerebral and cerebellar atrophy. Moderate low density in the periventricular white matter likely related to small vessel disease. No mass lesion, hemorrhage, hydrocephalus, acute infarct, intra-axial, or extra-axial fluid collection. Vascular: Intracranial atherosclerosis. Skull: No scalp soft tissue swelling.  No skull fracture. Sinuses/Orbits: Evaluated on dedicated face CT, dictated separately. Other: None. IMPRESSION: 1. No acute intracranial abnormality. 2. Cerebral atrophy and small vessel ischemic change. Electronically Signed   By: Jeronimo Greaves M.D.   On: 03/02/2020 14:53   CT Cervical Spine Wo Contrast  Result Date: 03/02/2020 CLINICAL DATA:  75 year old male with history of neck trauma from a fall on 02/28/2020. On Eliquis. Swelling in the left side of face. EXAM: CT MAXILLOFACIAL WITHOUT CONTRAST CT CERVICAL SPINE WITHOUT CONTRAST TECHNIQUE: Multidetector CT imaging of the maxillofacial structures was performed. Multiplanar CT image reconstructions were also generated. A small metallic BB was placed on the right temple in order to reliably differentiate right from left. Multidetector CT imaging of the cervical spine was performed without intravenous contrast. Multiplanar  CT image reconstructions were also generated. COMPARISON:  Head CT 03/02/2020. FINDINGS: CT MAXILLOFACIAL FINDINGS Osseous: No acute displaced fracture. Left mandibular condyle is anteriorly dislocated (sagittal image 76 of series 11). No destructive process. Orbits: Negative. No traumatic or inflammatory finding. Sinuses: Clear. Soft tissues: A extensive soft tissue swelling in the subcutaneous fat of the left side of the face lateral to the left mandible. Limited intracranial: See separate report for recently obtained head CT dated 03/02/2020 for full description of intracranial findings. CT CERVICAL FINDINGS Alignment: Normal. Skull base and vertebrae: No acute fracture. No primary bone lesion or focal pathologic process. Soft tissues and spinal canal: No prevertebral fluid or swelling. No visible canal hematoma. Disc levels: Mild multilevel degenerative disc disease, most apparent at C5-C6. Mild multilevel facet arthropathy. Upper chest: Unremarkable. Other: None. IMPRESSION: 1. Anterior dislocation of the left mandibular  condyle from the temporomandibular joint with extensive overlying soft tissue swelling. 2. No evidence of significant acute traumatic injury to the cervical spine. Electronically Signed   By: Trudie Reed M.D.   On: 03/02/2020 15:14   DG Chest Port 1 View  Result Date: 03/02/2020 CLINICAL DATA:  Dyspnea EXAM: PORTABLE CHEST 1 VIEW COMPARISON:  January 21, 2020 FINDINGS: The heart size and mediastinal contours are stable. The aorta is tortuous. Both lungs are clear. Chronic deformity of several lower right ribs are noted. IMPRESSION: No active disease. Electronically Signed   By: Sherian Rein M.D.   On: 03/02/2020 12:28   CT MAXILLOFACIAL WO CONTRAST  Result Date: 03/02/2020 CLINICAL DATA:  75 year old male with history of neck trauma from a fall on 02/28/2020. On Eliquis. Swelling in the left side of face. EXAM: CT MAXILLOFACIAL WITHOUT CONTRAST CT CERVICAL SPINE WITHOUT CONTRAST  TECHNIQUE: Multidetector CT imaging of the maxillofacial structures was performed. Multiplanar CT image reconstructions were also generated. A small metallic BB was placed on the right temple in order to reliably differentiate right from left. Multidetector CT imaging of the cervical spine was performed without intravenous contrast. Multiplanar CT image reconstructions were also generated. COMPARISON:  Head CT 03/02/2020. FINDINGS: CT MAXILLOFACIAL FINDINGS Osseous: No acute displaced fracture. Left mandibular condyle is anteriorly dislocated (sagittal image 76 of series 11). No destructive process. Orbits: Negative. No traumatic or inflammatory finding. Sinuses: Clear. Soft tissues: A extensive soft tissue swelling in the subcutaneous fat of the left side of the face lateral to the left mandible. Limited intracranial: See separate report for recently obtained head CT dated 03/02/2020 for full description of intracranial findings. CT CERVICAL FINDINGS Alignment: Normal. Skull base and vertebrae: No acute fracture. No primary bone lesion or focal pathologic process. Soft tissues and spinal canal: No prevertebral fluid or swelling. No visible canal hematoma. Disc levels: Mild multilevel degenerative disc disease, most apparent at C5-C6. Mild multilevel facet arthropathy. Upper chest: Unremarkable. Other: None. IMPRESSION: 1. Anterior dislocation of the left mandibular condyle from the temporomandibular joint with extensive overlying soft tissue swelling. 2. No evidence of significant acute traumatic injury to the cervical spine. Electronically Signed   By: Trudie Reed M.D.   On: 03/02/2020 15:14    ZOX:WRUEAV of Systems  Unable to perform ROS: Mental status change    Blood pressure 104/88, pulse (!) 115, temperature 99.9 F (37.7 C), resp. rate (!) 33, height  (1.727 m), weight 99.8 kg, SpO2 100 %.  PHYSICAL EXAM:  CONSTITUTIONAL: Obese male, in mild distress, somnolent, confused. Exam somewhat  limit secondary to patient compliance and agitation.  PULMONARY/CHEST WALL: on nasal cannula, no stridor, no stertor, no dysphonia HENT: Head : normocephalic and atraumatic Ears: Right ear:   canal normal, external ear normal and hearing normal Left ear:   canal normal, external ear normal and hearing normal Nose: nose normal and no purulence Mouth/Throat:  Mouth: Extremely dry oral mucosa with scabbing and dried blood noted. Multiple missing teeth, remaining dentition poor. Purulence expressed from left Stenson's duct.  EYES: conjunctiva normal, EOM normal and PERRL  Studies Reviewed:CT neck reviewed. Acute left parotitis with associated edema. Subluxation of left mandibular condyle.   Assessment/Plan: Celedonio Sortino is a 75 y/o M with recent history of Covid 19-infection, presenting with AMS, dehydration and left acute parotitis. - No acute intervention of anteriorly subluxed mandible. Recommend treatment of acute infectious process, as patient will not tolerate manual reduction in his current state - Recommend IV antibiotics as  per primary team, recommend Unasyn or Clindamycin  - Recommend IV Decadron  Q8 H for 3 total doses - Recommend application of warm compresses to left face with aggressive massage every 2 hours - this is must be done consistently - Recommend aggressive oral hydration and frequent use of sialogogues: lemon slices or sour ugar free candy when stable for PO intake - Medical management as per primary team   Thank you for allowing me to participate in the care of this patient. Please do not hesitate to contact me with any questions or concerns.   Laren Boom, DO Otolaryngology [facility] Cell: [phone]   03/02/2020, 3:49 PM

## 2020-03-02 NOTE — ED Notes (Signed)
Patient transported to CT 

## 2020-03-02 NOTE — ED Notes (Addendum)
Attempted to massage Lt face per MD order, patient did not tolerate well, pulled away from RN and yelled out "ow". Only pain medication ordered is Tylenol, patient received same around 1800. Mansy, MD messaged regarding same.

## 2020-03-02 NOTE — Progress Notes (Addendum)
Pharmacy Antibiotic Note  Chad Duke is a 75 y.o. male admitted on 03/02/2020 with sepsis.  Pharmacy has been consulted for vancomycin and cefepime dosing.  Addendum: To narrow to Unasyn 3g IV every 12 hours   Plan: Vancomycin 2000 mg IV x 1, then variable dosing due to unstable renal function Cefepime 2g IV every 24 hours Monitor renal function, Cx and clinical progression to narrow Vancomycin levels as needed  Height: 5\' 8"  (172.7 cm) Weight: 99.8 kg (220 lb) IBW/kg (Calculated) : 68.4  Temp (24hrs), Avg:98.7 F (37.1 C), Min:98.7 F (37.1 C), Max:98.7 F (37.1 C)  No results for input(s): WBC, CREATININE, LATICACIDVEN, VANCOTROUGH, VANCOPEAK, VANCORANDOM, GENTTROUGH, GENTPEAK, GENTRANDOM, TOBRATROUGH, TOBRAPEAK, TOBRARND, AMIKACINPEAK, AMIKACINTROU, AMIKACIN in the last 168 hours.  Estimated Creatinine Clearance: 39.1 mL/min (A) (by C-G formula based on SCr of 1.87 mg/dL (H)).    Allergies  Allergen Reactions  . Varenicline Other (See Comments)    Suicidal thoughts Bad dreams Other reaction(s): Other (See Comments) Suicidal thoughts  . Gabapentin Other (See Comments)    dizziness   , PharmD Clinical Pharmacist ED Pharmacist Phone # [phone] 03/02/2020 11:58 AM

## 2020-03-02 NOTE — ED Notes (Signed)
Patient still on COVID precautions per MD note, pharmacy contacted to D/C NEB orders and replace with INH.

## 2020-03-02 NOTE — ED Triage Notes (Signed)
Pt bib from [facility] s/p fall on 1/19 on Eliquis, R gaze noted, tender on L temple, satting in 80's on RA, 95%10L [place], garbled speech noted. AOx3 per report, no LSN.

## 2020-03-02 NOTE — ED Provider Notes (Signed)
[facility] EMERGENCY DEPARTMENT Provider Note   CSN: [id number] Arrival date & time: 03/02/20  1132     History Chief Complaint  Patient presents with  . Altered Mental Status    Chad Duke is a 75 y.o. male.  75 year old male with prior medical history as detailed below presents for evaluation.  Patient arrives from [facility].  Per EMS report patient with increased confusion over the last 2 to 3 days.  Patient may have had a fall as well.  Patient was diagnosed with COVID 2 weeks prior.  Patient was released from COVID isolation approximately 1 week ago.  Patient did not require hospitalization for his COVID infection.  Additional history is supplied through conversation with the patient's niece is POA.  She confirms that patient is DNR.  Patient is unable to provide significant history given his altered mental status.  Level five caveat secondary to same.  The history is provided by the patient, a relative, medical records and the EMS personnel.  Illness Location:  Altered mental status, dehydration, decreased p.o. intake Severity:  Moderate Onset quality:  Gradual Duration:  3 days Timing:  Constant Progression:  Worsening Chronicity:  New      Past Medical History:  Diagnosis Date  . AAA (abdominal aortic aneurysm) (HCC)   . Allergy    Rhinitis  . Arthritis    Rhuematoid  . Asthma   . CHF (congestive heart failure) (HCC)    chronic diastolic (congestive) heart failure from notes from [facility] face sheet  . Chronic renal insufficiency   . Depression    per notes from [facility] on chart  . Diverticulosis   . Emphysema   . Falls frequently   . GERD (gastroesophageal reflux disease)   . Hearing loss of both ears   . Hiatal hernia   . Hyperlipidemia   . Hypertension   . Male hypogonadism   . Nephrolithiasis   . Nephrolithiasis   . Sleep apnea    On CPAP  . Stasis dermatitis   . Varicose veins   . Vitamin B 12 deficiency      Patient Active Problem List   Diagnosis Date Noted  . Severe sepsis (HCC) 03/02/2020  . Chronic renal disease, stage III (HCC) 01/25/2018  . Weakness 10/03/2017  . Adult failure to thrive 10/03/2017  . Positive D dimer 09/26/2017  . Elevated troponin 09/26/2017  . Personal history of DVT (deep vein thrombosis) 09/26/2017  . COPD with acute exacerbation (HCC) 09/26/2017  . CAD (coronary artery disease) 09/08/2017  . Goals of care, counseling/discussion   . Palliative care by specialist   . Acute kidney injury superimposed on chronic kidney disease (HCC) 09/02/2017  . Hypokalemia 09/02/2017  . Hypomagnesemia 09/02/2017  . Controlled type 2 diabetes mellitus with hyperglycemia (HCC) 09/02/2017  . Urinary incontinence 09/02/2017  . Fall 08/13/2017  . Acute renal failure superimposed on stage 3 chronic kidney disease (HCC) 08/13/2017  . HLD (hyperlipidemia) 08/13/2017  . Essential hypertension 08/13/2017  . GERD (gastroesophageal reflux disease) 08/13/2017  . Orthostatic hypotension 08/13/2017  . Deep vein thrombosis (DVT) of both lower extremities (HCC)   . Hyperglycemia   . History of pulmonary embolism 07/20/2017  . Acute on chronic diastolic heart failure (HCC) 07/20/2017  . Bilateral leg edema 07/20/2017  . Acute exacerbation of chronic obstructive pulmonary disease (COPD) (HCC) 07/19/2017  . 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 Cigarette smoker 09/08/2016  . Morbid obesity due to excess calories (HCC) 09/08/2016  .  Pain in joint, ankle and foot 03/31/2012  . Abnormality of gait 03/31/2012  . Rheumatoid arthritis (HCC) 03/31/2012  . COPD GOLD III  02/12/2011  . Sleep apnea 02/12/2011    Past Surgical History:  Procedure Laterality Date  . CARPAL TUNNEL RELEASE     bilateral  . CYSTOSCOPY/URETEROSCOPY/HOLMIUM LASER/STENT PLACEMENT Left 11/02/2018   Procedure: CYSTOSCOPY/URETEROSCOPY/HOLMIUM LASER/STENT PLACEMENT;  Surgeon: Crista Elliot, MD;  Location: [facility]  ORS;  Service: Urology;  Laterality: Left;  . FRACTURE SURGERY Left    Fibula  . JOINT REPLACEMENT Left    Shoulder  . JOINT REPLACEMENT Left    Hip   . KIDNEY STONE SURGERY  2003  . left hip replacement    . left knee    . left shoulder    . LITHOTRIPSY         Family History  Problem Relation Age of Onset  . Emphysema Father   . Asthma Father   . Osteoarthritis Father   . Heart attack Mother   . Uterine cancer Mother   . Heart disease Mother   . ADD / ADHD Son   . Depression Son   . Hypertension Brother   . Heart disease Brother        Pacemaker   . Heart murmur Brother     Social History   Tobacco Use  . Smoking status: Current Every Day Smoker    Packs/day: 2.00    Years: 53.00    Pack years: 106.00    Types: Cigarettes  . Smokeless tobacco: Never Used  . Tobacco comment: currently smoking 1ppd--01/09/2020  Vaping Use  . Vaping Use: Never used  Substance Use Topics  . Alcohol use: Not Currently    Alcohol/week: 0.0 standard drinks  . Drug use: No    Home Medications Prior to Admission medications   Medication Sig Start Date End Date Taking? Authorizing Provider  albuterol (PROVENTIL HFA;VENTOLIN HFA) 108 (90 Base) MCG/ACT inhaler Inhale 2 puffs into the lungs every 4 (four) hours as needed for wheezing or shortness of breath. 07/30/17   Nyoka Cowden, MD  allopurinol (ZYLOPRIM) 100 MG tablet Take 100 mg by mouth daily.    [provider]  amoxicillin-clavulanate (AUGMENTIN) 875-125 MG tablet Take 1 tablet by mouth See admin instructions. Q12h x 7 days.    [provider]  apixaban (ELIQUIS) 5 MG TABS tablet Take 1 tablet (5 mg total) by mouth 2 (two) times daily. 09/04/17   Albertine Grates, MD  Ascorbic Acid (VITAMIN C) 1000 MG tablet Take 1,000 mg by mouth See admin instructions. Q12h x 10 days    [provider]  atorvastatin (LIPITOR) 40 MG tablet Take 1 tablet (40 mg total) by mouth daily. Patient taking differently: Take 40 mg by  mouth at bedtime. 05/18/17 10/03/25  Lewayne Bunting, MD  budesonide (PULMICORT) 0.25 MG/2ML nebulizer solution Take 0.25 mg by nebulization 2 (two) times daily.    [provider]  buPROPion (ZYBAN) 150 MG 12 hr tablet Take 1 tablet (150 mg total) by mouth 2 (two) times daily. 01/09/20   Coralyn Helling, MD  CALCIUM-MAGNESIUM-VITAMIN D PO Take 1 tablet by mouth daily.    [provider]  cefTRIAXone (ROCEPHIN) 1 g injection Inject 1 g into the muscle once. Patient not taking: Reported on 02/20/2020    [provider]  Cholecalciferol (VITAMIN D-3) 125 MCG (5000 UT) TABS Take 5,000 Units by mouth daily.    [provider]  Effie Berkshire  RESPIMAT 20-100 MCG/ACT AERS respimat Inhale 1 puff into the lungs 4 (four) times daily. 12/16/19   [provider]  dexamethasone (DECADRON) 6 MG tablet Take 6 mg by mouth See admin instructions. Qd x 5 days    [provider]  furosemide (LASIX) 40 MG tablet Take 40 mg by mouth daily.    [provider]  Glucerna (GLUCERNA) LIQD Take 237 mLs by mouth 2 (two) times daily between meals.    [provider]  ipratropium-albuterol (DUONEB) 0.5-2.5 (3) MG/3ML SOLN Take 3 mLs by nebulization 3 (three) times daily.     [provider]  Magnesium 400 MG CAPS Take 400 mg by mouth at bedtime.     [provider]  meclizine (ANTIVERT) 12.5 MG tablet Take 1 tablet (12.5 mg total) by mouth 3 (three) times daily as needed for dizziness. 01/19/20   Charlynne Pander, MD  metFORMIN (GLUCOPHAGE) 500 MG tablet Take 500 mg by mouth 2 (two) times daily. 05/09/19   [provider]  pantoprazole (PROTONIX) 40 MG tablet Take 40 mg by mouth daily.     [provider]  potassium chloride SA (K-DUR,KLOR-CON) 20 MEQ tablet Take 40 mEq by mouth daily.    [provider]  predniSONE (DELTASONE) 5 MG tablet Take 10 mg by mouth daily as needed (flares).    [provider]   Remdesivir 100 MG/20ML SOLN Inject 100 mg into the vein See admin instructions. 2 day course    [provider]  SYMBICORT 160-4.5 MCG/ACT inhaler Inhale 2 puffs into the lungs in the morning and at bedtime. 01/15/20   [provider]  vitamin C (ASCORBIC ACID) 500 MG tablet Take 1,000 mg by mouth at bedtime. Patient not taking: No sig reported    [provider]  Zinc 220 (50 Zn) MG CAPS Take 220 mg by mouth See admin instructions. Qd x 10 days    [provider]    Allergies    Varenicline and Gabapentin  Review of Systems   Review of Systems  Unable to perform ROS: Acuity of condition    Physical Exam Updated Vital Signs BP 105/81   Pulse (!) 109   Temp 99.9 F (37.7 C)   Resp (!) 32   Ht 5\' 8"  (1.727 m)   Wt 99.8 kg   SpO2 100%   BMI 33.45 kg/m   Physical Exam Vitals and nursing note reviewed.  Constitutional:      General: He is not in acute distress.    Appearance: He is well-developed and well-nourished. He is ill-appearing.  HENT:     Head: Normocephalic and atraumatic.     Comments: Significant edema and erythema overlying the left parotid.  Consistent with likely parotitis.    Mouth/Throat:     Mouth: Oropharynx is clear and moist. Mucous membranes are dry.     Comments: Very dry mucous membranes. Eyes:     Extraocular Movements: EOM normal.     Conjunctiva/sclera: Conjunctivae normal.     Pupils: Pupils are equal, round, and reactive to light.  Cardiovascular:     Rate and Rhythm: Normal rate and regular rhythm.     Heart sounds: Normal heart sounds.  Pulmonary:     Effort: Pulmonary effort is normal. No respiratory distress.     Breath sounds: Normal breath sounds.  Abdominal:     General: There is no distension.     Palpations: Abdomen is soft.     Tenderness: There  is no abdominal tenderness.  Musculoskeletal:        General: No deformity or edema. Normal range of motion.     Cervical back: Normal range of  motion and neck supple.  Skin:    General: Skin is warm and dry.  Neurological:     Mental Status: He is alert. He is disoriented.     Cranial Nerves: No cranial nerve deficit.     Comments: Patient is confused.  He does respond to verbal stimulation.  He attempts to answer questions.  His responses are nonsensical.  No focal neurodeficit noted.  Psychiatric:        Mood and Affect: Mood and affect normal.     ED Results / Procedures / Treatments   Labs (all labs ordered are listed, but only abnormal results are displayed) Labs Reviewed  SARS CORONAVIRUS 2 BY RT PCR (HOSPITAL ORDER, PERFORMED IN [facility]) - Abnormal; Notable for the following components:      Result Value   SARS Coronavirus 2 POSITIVE (*)    All other components within normal limits  URINALYSIS, ROUTINE W REFLEX MICROSCOPIC - Abnormal; Notable for the following components:   Color, Urine AMBER (*)    APPearance HAZY (*)    Hgb urine dipstick MODERATE (*)    Bacteria, UA RARE (*)    All other components within normal limits  COMPREHENSIVE METABOLIC PANEL - Abnormal; Notable for the following components:   Sodium 146 (*)    Glucose, Bld 146 (*)    BUN 49 (*)    Creatinine, Ser 3.22 (*)    Albumin 2.4 (*)    Total Bilirubin 1.5 (*)    GFR, Estimated 19 (*)    All other components within normal limits  LACTIC ACID, PLASMA - Abnormal; Notable for the following components:   Lactic Acid, Venous 2.1 (*)    All other components within normal limits  CBC WITH DIFFERENTIAL/PLATELET - Abnormal; Notable for the following components:   WBC 24.0 (*)    RDW 17.5 (*)    Neutro Abs 18.3 (*)    Monocytes Absolute 2.3 (*)    Abs Immature Granulocytes 0.44 (*)    All other components within normal limits  PROTIME-INR - Abnormal; Notable for the following components:   Prothrombin Time 19.9 (*)    INR 1.8 (*)    All other components within normal limits  I-STAT ARTERIAL BLOOD GAS, ED - Abnormal; Notable  for the following components:   pO2, Arterial 71 (*)    Sodium 146 (*)    HCT 37.0 (*)    Hemoglobin 12.6 (*)    All other components within normal limits  TROPONIN I (HIGH SENSITIVITY) - Abnormal; Notable for the following components:   Troponin I (High Sensitivity) 81 (*)    All other components within normal limits  CULTURE, BLOOD (ROUTINE X 2)  CULTURE, BLOOD (ROUTINE X 2)  URINE CULTURE  LIPASE, BLOOD  LACTIC ACID, PLASMA  AMMONIA  CK  BLOOD GAS, ARTERIAL  I-STAT CHEM 8, ED  TYPE AND SCREEN  TROPONIN I (HIGH SENSITIVITY)    EKG EKG Interpretation  Date/Time:  Saturday March 02 2020 11:56:19 EST Ventricular Rate:  129 PR Interval:    QRS Duration: 132 QT Interval:  318 QTC Calculation: 466 R Axis:   -92 Text Interpretation: Sinus tachycardia Ventricular premature complex Aberrant complex Right bundle branch block Confirmed by Kristine Royal [phone]) on 03/02/2020 12:37:39 PM   Radiology CT Head Wo  Contrast  Result Date: 03/02/2020 CLINICAL DATA:  "Head trauma, minor". EXAM: CT HEAD WITHOUT CONTRAST TECHNIQUE: Contiguous axial images were obtained from the base of the skull through the vertex without intravenous contrast. COMPARISON:  02/20/2020. FINDINGS: Brain: Advanced cerebral and cerebellar atrophy. Moderate low density in the periventricular white matter likely related to small vessel disease. No mass lesion, hemorrhage, hydrocephalus, acute infarct, intra-axial, or extra-axial fluid collection. Vascular: Intracranial atherosclerosis. Skull: No scalp soft tissue swelling.  No skull fracture. Sinuses/Orbits: Evaluated on dedicated face CT, dictated separately. Other: None. IMPRESSION: 1. No acute intracranial abnormality. 2. Cerebral atrophy and small vessel ischemic change. Electronically Signed   By: Jeronimo Greaves M.D.   On: 03/02/2020 14:53   CT Cervical Spine Wo Contrast  Result Date: 03/02/2020 CLINICAL DATA:  75 year old male with history of neck trauma from a  fall on 02/28/2020. On Eliquis. Swelling in the left side of face. EXAM: CT MAXILLOFACIAL WITHOUT CONTRAST CT CERVICAL SPINE WITHOUT CONTRAST TECHNIQUE: Multidetector CT imaging of the maxillofacial structures was performed. Multiplanar CT image reconstructions were also generated. A small metallic BB was placed on the right temple in order to reliably differentiate right from left. Multidetector CT imaging of the cervical spine was performed without intravenous contrast. Multiplanar CT image reconstructions were also generated. COMPARISON:  Head CT 03/02/2020. FINDINGS: CT MAXILLOFACIAL FINDINGS Osseous: No acute displaced fracture. Left mandibular condyle is anteriorly dislocated (sagittal image 76 of series 11). No destructive process. Orbits: Negative. No traumatic or inflammatory finding. Sinuses: Clear. Soft tissues: A extensive soft tissue swelling in the subcutaneous fat of the left side of the face lateral to the left mandible. Limited intracranial: See separate report for recently obtained head CT dated 03/02/2020 for full description of intracranial findings. CT CERVICAL FINDINGS Alignment: Normal. Skull base and vertebrae: No acute fracture. No primary bone lesion or focal pathologic process. Soft tissues and spinal canal: No prevertebral fluid or swelling. No visible canal hematoma. Disc levels: Mild multilevel degenerative disc disease, most apparent at C5-C6. Mild multilevel facet arthropathy. Upper chest: Unremarkable. Other: None. IMPRESSION: 1. Anterior dislocation of the left mandibular condyle from the temporomandibular joint with extensive overlying soft tissue swelling. 2. No evidence of significant acute traumatic injury to the cervical spine. Electronically Signed   By: Trudie Reed M.D.   On: 03/02/2020 15:14   DG Chest Port 1 View  Result Date: 03/02/2020 CLINICAL DATA:  Dyspnea EXAM: PORTABLE CHEST 1 VIEW COMPARISON:  January 21, 2020 FINDINGS: The heart size and mediastinal  contours are stable. The aorta is tortuous. Both lungs are clear. Chronic deformity of several lower right ribs are noted. IMPRESSION: No active disease. Electronically Signed   By: Sherian Rein M.D.   On: 03/02/2020 12:28   CT MAXILLOFACIAL WO CONTRAST  Result Date: 03/02/2020 CLINICAL DATA:  75 year old male with history of neck trauma from a fall on 02/28/2020. On Eliquis. Swelling in the left side of face. EXAM: CT MAXILLOFACIAL WITHOUT CONTRAST CT CERVICAL SPINE WITHOUT CONTRAST TECHNIQUE: Multidetector CT imaging of the maxillofacial structures was performed. Multiplanar CT image reconstructions were also generated. A small metallic BB was placed on the right temple in order to reliably differentiate right from left. Multidetector CT imaging of the cervical spine was performed without intravenous contrast. Multiplanar CT image reconstructions were also generated. COMPARISON:  Head CT 03/02/2020. FINDINGS: CT MAXILLOFACIAL FINDINGS Osseous: No acute displaced fracture. Left mandibular condyle is anteriorly dislocated (sagittal image 76 of series 11). No destructive process. Orbits: Negative. No traumatic  or inflammatory finding. Sinuses: Clear. Soft tissues: A extensive soft tissue swelling in the subcutaneous fat of the left side of the face lateral to the left mandible. Limited intracranial: See separate report for recently obtained head CT dated 03/02/2020 for full description of intracranial findings. CT CERVICAL FINDINGS Alignment: Normal. Skull base and vertebrae: No acute fracture. No primary bone lesion or focal pathologic process. Soft tissues and spinal canal: No prevertebral fluid or swelling. No visible canal hematoma. Disc levels: Mild multilevel degenerative disc disease, most apparent at C5-C6. Mild multilevel facet arthropathy. Upper chest: Unremarkable. Other: None. IMPRESSION: 1. Anterior dislocation of the left mandibular condyle from the temporomandibular joint with extensive overlying  soft tissue swelling. 2. No evidence of significant acute traumatic injury to the cervical spine. Electronically Signed   By: Trudie Reed M.D.   On: 03/02/2020 15:14    Procedures Procedures (including critical care time) CRITICAL CARE Performed by: Wynetta Fines   Total critical care time: 30 minutes  Critical care time was exclusive of separately billable procedures and treating other patients.  Critical care was necessary to treat or prevent imminent or life-threatening deterioration.  Critical care was time spent personally by me on the following activities: development of treatment plan with patient and/or surrogate as well as nursing, discussions with consultants, evaluation of patient's response to treatment, examination of patient, obtaining history from patient or surrogate, ordering and performing treatments and interventions, ordering and review of laboratory studies, ordering and review of radiographic studies, pulse oximetry and re-evaluation of patient's condition.   Medications Ordered in ED Medications  ceFEPIme (MAXIPIME) 2 g in sodium chloride 0.9 % 100 mL IVPB (has no administration in time range)  vancomycin variable dose per unstable renal function (pharmacist dosing) (has no administration in time range)  sodium chloride 0.9 % bolus 1,000 mL (0 mLs Intravenous Stopped 03/02/20 1300)  vancomycin (VANCOREADY) IVPB 2000 mg/400 mL (2,000 mg Intravenous New Bag/Given 03/02/20 1356)  ceFEPIme (MAXIPIME) 2 g in sodium chloride 0.9 % 100 mL IVPB (0 g Intravenous Stopped 03/02/20 1356)  acetaminophen (TYLENOL) suppository 650 mg (650 mg Rectal Given 03/02/20 1526)  sodium chloride 0.9 % bolus 1,000 mL (1,000 mLs Intravenous New Bag/Given 03/02/20 1710)  dexamethasone (DECADRON) injection 8 mg (8 mg Intravenous Given 03/02/20 1712)    ED Course  I have reviewed the triage vital signs and the nursing notes.  Pertinent labs & imaging results that were available during my  care of the patient were reviewed by me and considered in my medical decision making (see chart for details).    MDM Rules/Calculators/A&P                          MDM  Screen complete  Chad Duke was evaluated in Emergency Department on 03/02/2020 for the symptoms described in the history of present illness. He was evaluated in the context of the global COVID-19 pandemic, which necessitated consideration that the patient might be at risk for infection with the SARS-CoV-2 virus that causes COVID-19. Institutional protocols and algorithms that pertain to the evaluation of patients at risk for COVID-19 are in a state of rapid change based on information released by regulatory bodies including the CDC and federal and state organizations. These policies and algorithms were followed during the patient's care in the ED.   Patient is presenting for evaluation of reported AMS in the setting of likely dehydration.  Patient is noted to be febrile and tachycardic.  Broad-spectrum antibiotics initiated in the ED.  Patient's exam is suggestive of likely left parotitis.  Patient is improved after IV fluids.    ENT is aware of case and has evaluated the patient in the ED at bedside.  ENT does not feel the patient is an operative candidate at this time.  Hospitalist service is aware of case and will evaluate for admission.  The patient's niece Carlynn Herald) and POA is aware of case progression and need for admission.  She again confirmed patient's DNR status with this provider.  Final Clinical Impression(s) / ED Diagnoses Final diagnoses:  Altered mental status, unspecified altered mental status type  Parotitis  AKI (acute kidney injury) [facility])    Rx / DC Orders ED Discharge Orders    None       Wynetta Fines, MD 03/02/20 1731

## 2020-03-02 NOTE — H&P (Addendum)
History and Physical    Chad Duke  ZOX:[id number]  DOB: [birth year]  DOA: 03/02/2020 PCP: Soundra Pilon, FNP   Patient coming from: SNF  Chief Complaint: AMS  HPI: Chad Duke is a 75 y.o. male who lives at [facility] care and has a h/o chronic diastolic CHF, AAA, HTN, HLD, PE, rheumatoid arthritis, hypogonadism, depression, nephrolithiasis, COPD on 2 L O2 and OSA.  He is sent from SNF for confusion and progressively poor oral intake. In the ED he is is found to have a left facial swelling consisted with parotitis. He is unable to give a history. The ED doctor has spoken with the patient's niece who mentions that  patient had COVID 19 about 2 wks ago and was treated at the facility. He is a DNR   ED Course:  HR in 120s, RR in 30s, temp 100.1 WBC 24.0,Lactic acid 2.1 followed by 1.7 Cr 3.22, sodium 146 Cefepime, Vancomycin.  ENT consult- started on Decadron  Review of Systems:  All other systems reviewed and apart from HPI, are negative.  Past Medical History:  Diagnosis Date  . AAA (abdominal aortic aneurysm) (HCC)   . Allergy    Rhinitis  . Arthritis    Rhuematoid  . Asthma   . CHF (congestive heart failure) (HCC)    chronic diastolic (congestive) heart failure from notes from [facility] face sheet  . Chronic renal insufficiency   . Depression    per notes from [facility] on chart  . Diverticulosis   . Emphysema   . Falls frequently   . GERD (gastroesophageal reflux disease)   . Hearing loss of both ears   . Hiatal hernia   . Hyperlipidemia   . Hypertension   . Male hypogonadism   . Nephrolithiasis   . Nephrolithiasis   . Sleep apnea    On CPAP  . Stasis dermatitis   . Varicose veins   . Vitamin B 12 deficiency     Past Surgical History:  Procedure Laterality Date  . CARPAL TUNNEL RELEASE     bilateral  . CYSTOSCOPY/URETEROSCOPY/HOLMIUM LASER/STENT PLACEMENT Left 11/02/2018   Procedure: CYSTOSCOPY/URETEROSCOPY/HOLMIUM LASER/STENT  PLACEMENT;  Surgeon: Crista Elliot, MD;  Location: [facility];  Service: Urology;  Laterality: Left;  . FRACTURE SURGERY Left    Fibula  . JOINT REPLACEMENT Left    Shoulder  . JOINT REPLACEMENT Left    Hip   . KIDNEY STONE SURGERY  2003  . left hip replacement    . left knee    . left shoulder    . LITHOTRIPSY      Social History:  Unable to obtain  Allergies  Allergen Reactions  . Varenicline Other (See Comments)    Suicidal thoughts Bad dreams Other reaction(s): Other (See Comments) Suicidal thoughts  . Gabapentin Other (See Comments)    dizziness    Family History  Problem Relation Age of Onset  . Emphysema Father   . Asthma Father   . Osteoarthritis Father   . Heart attack Mother   . Uterine cancer Mother   . Heart disease Mother   . ADD / ADHD Son   . Depression Son   . Hypertension Brother   . Heart disease Brother        Pacemaker   . Heart murmur Brother      Prior to Admission medications   Medication Sig Start Date End Date Taking? Authorizing Provider  albuterol (PROVENTIL HFA;VENTOLIN HFA) 108 (90 Base) MCG/ACT inhaler Inhale  2 puffs into the lungs every 4 (four) hours as needed for wheezing or shortness of breath. 07/30/17   Nyoka Cowden, MD  allopurinol (ZYLOPRIM) 100 MG tablet Take 100 mg by mouth daily.    [provider]  amoxicillin-clavulanate (AUGMENTIN) 875-125 MG tablet Take 1 tablet by mouth See admin instructions. Q12h x 7 days.    [provider]  apixaban (ELIQUIS) 5 MG TABS tablet Take 1 tablet (5 mg total) by mouth 2 (two) times daily. 09/04/17   Albertine Grates, MD  Ascorbic Acid (VITAMIN C) 1000 MG tablet Take 1,000 mg by mouth See admin instructions. Q12h x 10 days    [provider]  atorvastatin (LIPITOR) 40 MG tablet Take 1 tablet (40 mg total) by mouth daily. Patient taking differently: Take 40 mg by mouth at bedtime. 05/18/17 10/03/25  Lewayne Bunting, MD  budesonide (PULMICORT) 0.25 MG/2ML nebulizer  solution Take 0.25 mg by nebulization 2 (two) times daily.    [provider]  buPROPion (ZYBAN) 150 MG 12 hr tablet Take 1 tablet (150 mg total) by mouth 2 (two) times daily. 01/09/20   Coralyn Helling, MD  CALCIUM-MAGNESIUM-VITAMIN D PO Take 1 tablet by mouth daily.    [provider]  cefTRIAXone (ROCEPHIN) 1 g injection Inject 1 g into the muscle once. Patient not taking: Reported on 02/20/2020    [provider]  Cholecalciferol (VITAMIN D-3) 125 MCG (5000 UT) TABS Take 5,000 Units by mouth daily.    [provider]  COMBIVENT RESPIMAT 20-100 MCG/ACT AERS respimat Inhale 1 puff into the lungs 4 (four) times daily. 12/16/19   [provider]  dexamethasone (DECADRON) 6 MG tablet Take 6 mg by mouth See admin instructions. Qd x 5 days    [provider]  furosemide (LASIX) 40 MG tablet Take 40 mg by mouth daily.    [provider]  Glucerna (GLUCERNA) LIQD Take 237 mLs by mouth 2 (two) times daily between meals.    [provider]  ipratropium-albuterol (DUONEB) 0.5-2.5 (3) MG/3ML SOLN Take 3 mLs by nebulization 3 (three) times daily.     [provider]  Magnesium 400 MG CAPS Take 400 mg by mouth at bedtime.     [provider]  meclizine (ANTIVERT) 12.5 MG tablet Take 1 tablet (12.5 mg total) by mouth 3 (three) times daily as needed for dizziness. 01/19/20   Charlynne Pander, MD  metFORMIN (GLUCOPHAGE) 500 MG tablet Take 500 mg by mouth 2 (two) times daily. 05/09/19   [provider]  pantoprazole (PROTONIX) 40 MG tablet Take 40 mg by mouth daily.     [provider]  potassium chloride SA (K-DUR,KLOR-CON) 20 MEQ tablet Take 40 mEq by mouth daily.    [provider]  predniSONE (DELTASONE) 5 MG tablet Take 10 mg by mouth daily as needed (flares).    [provider]  Remdesivir 100 MG/20ML SOLN Inject 100 mg into the vein See admin instructions. 2 day course    [provider]  SYMBICORT 160-4.5 MCG/ACT inhaler Inhale 2 puffs into the lungs in the morning and at bedtime. 01/15/20   [provider]  vitamin C (ASCORBIC ACID) 500 MG tablet Take 1,000 mg by mouth at bedtime. Patient not taking: No sig reported    [provider]  Zinc 220 (50 Zn) MG CAPS Take 220 mg by mouth See admin instructions. Qd x 10 days    [provider]  Physical Exam: Wt Readings from Last 3 Encounters:  03/02/20 99.8 kg  01/21/20 99 kg  01/19/20 99.8 kg   Vitals:   03/02/20 1445 03/02/20 1500 03/02/20 1515 03/02/20 1640  BP:    105/81  Pulse: (!) 118 (!) 116 (!) 115 (!) 109  Resp: (!) 28 (!) 29 (!) 33 (!) 32  Temp: 100.1 F (37.8 C) 100 F (37.8 C) 99.9 F (37.7 C)   TempSrc:      SpO2: 95% 97% 100% 100%  Weight:      Height:          Constitutional:  Calm & comfortable Eyes: PERRL, lids and conjunctivae normal ENT:  Mucous membranes are very dry Left preauricular swelling is noted Neck: Supple, no masses  Respiratory:  Clear to auscultation bilaterally  Normal respiratory effort.  Cardiovascular:  S1 & S2 heard, regular rate and rhythm No Murmurs Abdomen:  Non distended No tenderness, No masses Bowel sounds normal Extremities:  No clubbing / cyanosis No pedal edema No joint deformity    Skin:  No rashes, lesions or ulcers Neurologic:  Cannot assess- he will open his eyes but will not answer questions or follow commands Psychiatric:  Cannot assess    Labs on Admission: I have personally reviewed following labs and imaging studies  CBC: Recent Labs  Lab 03/02/20 1155 03/02/20 1355  WBC 24.0*  --   NEUTROABS 18.3*  --   HGB 14.3 12.6*  HCT 44.8 37.0*  MCV 97.6  --   PLT 191  --    Basic Metabolic Panel: Recent Labs  Lab 03/02/20 1155 03/02/20 1355  NA 146* 146*  K 4.7 4.1  CL 108  --   CO2 24  --   GLUCOSE 146*  --   BUN 49*  --   CREATININE 3.22*  --   CALCIUM 9.2  --     GFR: Estimated Creatinine Clearance: 22.7 mL/min (A) (by C-G formula based on SCr of 3.22 mg/dL (H)). Liver Function Tests: Recent Labs  Lab 03/02/20 1155  AST 28  ALT 26  ALKPHOS 102  BILITOT 1.5*  PROT 7.6  ALBUMIN 2.4*   Recent Labs  Lab 03/02/20 1155  LIPASE 22   Recent Labs  Lab 03/02/20 1155  AMMONIA 18   Coagulation Profile: Recent Labs  Lab 03/02/20 1155  INR 1.8*   Cardiac Enzymes: Recent Labs  Lab 03/02/20 1155  CKTOTAL 117   BNP (last 3 results) No results for input(s): PROBNP in the last 8760 hours. HbA1C: No results for input(s): HGBA1C in the last 72 hours. CBG: No results for input(s): GLUCAP in the last 168 hours. Lipid Profile: No results for input(s): CHOL, HDL, LDLCALC, TRIG, CHOLHDL, LDLDIRECT in the last 72 hours. Thyroid Function Tests: No results for input(s): TSH, T4TOTAL, FREET4, T3FREE, THYROIDAB in the last 72 hours. Anemia Panel: No results for input(s): VITAMINB12, FOLATE, FERRITIN, TIBC, IRON, RETICCTPCT in the last 72 hours. Urine analysis:    Component Value Date/Time   COLORURINE AMBER (A) 03/02/2020 1143   APPEARANCEUR HAZY (A) 03/02/2020 1143   LABSPEC 1.014 03/02/2020 1143   PHURINE 5.0 03/02/2020 1143   GLUCOSEU NEGATIVE 03/02/2020 1143   HGBUR MODERATE (A) 03/02/2020 1143   BILIRUBINUR NEGATIVE 03/02/2020 1143   KETONESUR NEGATIVE 03/02/2020 1143   PROTEINUR NEGATIVE 03/02/2020 1143   UROBILINOGEN 0.2 03/28/2007 1330   NITRITE NEGATIVE 03/02/2020 1143   LEUKOCYTESUR NEGATIVE 03/02/2020 1143   Sepsis Labs: (procalcitonin:4,lacticidven:4) ) Recent Results (from the past  240 hour(s))  Culture, blood (routine x 2)     Status: None (Preliminary result)   Collection Time: 03/02/20 11:43 AM   Specimen: BLOOD  Result Value Ref Range Status   Specimen Description BLOOD RIGHT ANTECUBITAL  Final   Special Requests   Final    BOTTLES DRAWN AEROBIC AND ANAEROBIC Blood Culture adequate volume   Culture    Final    NO GROWTH < 12 HOURS Performed at [facility], [address], [place], [place] [address]    Report Status PENDING  Incomplete  SARS Coronavirus 2 by RT PCR (hospital order, performed in [facility] hospital lab) Nasopharyngeal Nasopharyngeal Swab     Status: Abnormal   Collection Time: 03/02/20 11:46 AM   Specimen: Nasopharyngeal Swab  Result Value Ref Range Status   SARS Coronavirus 2 POSITIVE (A) NEGATIVE Final    Comment: RESULT CALLED TO, READ BACK BY AND VERIFIED WITH: RN Colonel Bald [phone] AT 1405 BY CM (NOTE) SARS-CoV-2 target nucleic acids are DETECTED  SARS-CoV-2 RNA is generally detectable in upper respiratory specimens  during the acute phase of infection.  Positive results are indicative  of the presence of the identified virus, but do not rule out bacterial infection or co-infection with other pathogens not detected by the test.  Clinical correlation with patient history and  other diagnostic information is necessary to determine patient infection status.  The expected result is negative.  Fact Sheet for Patients:   BoilerBrush.com.cy   Fact Sheet for Healthcare Providers:   https://pope.com/    This test is not yet approved or cleared by the [place] FDA and  has been authorized for detection and/or diagnosis of SARS-CoV-2 by FDA under an Emergency Use Authorization (EUA).  This EUA will remain in effect (meaning this test c an be used) for the duration of  the COVID-19 declaration under Section 564(b)(1) of the Act, 21 U.S.C. section 360-bbb-3(b)(1), unless the authorization is terminated or revoked sooner.  Performed at [facility], [address], [place], [place] [address]      Radiological Exams on Admission: CT Head Wo Contrast  Result Date: 03/02/2020 CLINICAL DATA:  "Head trauma, minor". EXAM: CT HEAD WITHOUT CONTRAST TECHNIQUE: Contiguous axial images were obtained from the base  of the skull through the vertex without intravenous contrast. COMPARISON:  02/20/2020. FINDINGS: Brain: Advanced cerebral and cerebellar atrophy. Moderate low density in the periventricular white matter likely related to small vessel disease. No mass lesion, hemorrhage, hydrocephalus, acute infarct, intra-axial, or extra-axial fluid collection. Vascular: Intracranial atherosclerosis. Skull: No scalp soft tissue swelling.  No skull fracture. Sinuses/Orbits: Evaluated on dedicated face CT, dictated separately. Other: None. IMPRESSION: 1. No acute intracranial abnormality. 2. Cerebral atrophy and small vessel ischemic change. Electronically Signed   By: Jeronimo Greaves M.D.   On: 03/02/2020 14:53   CT Cervical Spine Wo Contrast  Result Date: 03/02/2020 CLINICAL DATA:  75 year old male with history of neck trauma from a fall on 02/28/2020. On Eliquis. Swelling in the left side of face. EXAM: CT MAXILLOFACIAL WITHOUT CONTRAST CT CERVICAL SPINE WITHOUT CONTRAST TECHNIQUE: Multidetector CT imaging of the maxillofacial structures was performed. Multiplanar CT image reconstructions were also generated. A small metallic BB was placed on the right temple in order to reliably differentiate right from left. Multidetector CT imaging of the cervical spine was performed without intravenous contrast. Multiplanar CT image reconstructions were also generated. COMPARISON:  Head CT 03/02/2020. FINDINGS: CT MAXILLOFACIAL FINDINGS Osseous: No acute displaced  fracture. Left mandibular condyle is anteriorly dislocated (sagittal image 76 of series 11). No destructive process. Orbits: Negative. No traumatic or inflammatory finding. Sinuses: Clear. Soft tissues: A extensive soft tissue swelling in the subcutaneous fat of the left side of the face lateral to the left mandible. Limited intracranial: See separate report for recently obtained head CT dated 03/02/2020 for full description of intracranial findings. CT CERVICAL FINDINGS Alignment:  Normal. Skull base and vertebrae: No acute fracture. No primary bone lesion or focal pathologic process. Soft tissues and spinal canal: No prevertebral fluid or swelling. No visible canal hematoma. Disc levels: Mild multilevel degenerative disc disease, most apparent at C5-C6. Mild multilevel facet arthropathy. Upper chest: Unremarkable. Other: None. IMPRESSION: 1. Anterior dislocation of the left mandibular condyle from the temporomandibular joint with extensive overlying soft tissue swelling. 2. No evidence of significant acute traumatic injury to the cervical spine. Electronically Signed   By: Trudie Reed M.D.   On: 03/02/2020 15:14   DG Chest Port 1 View  Result Date: 03/02/2020 CLINICAL DATA:  Dyspnea EXAM: PORTABLE CHEST 1 VIEW COMPARISON:  January 21, 2020 FINDINGS: The heart size and mediastinal contours are stable. The aorta is tortuous. Both lungs are clear. Chronic deformity of several lower right ribs are noted. IMPRESSION: No active disease. Electronically Signed   By: Sherian Rein M.D.   On: 03/02/2020 12:28   CT MAXILLOFACIAL WO CONTRAST  Result Date: 03/02/2020 CLINICAL DATA:  75 year old male with history of neck trauma from a fall on 02/28/2020. On Eliquis. Swelling in the left side of face. EXAM: CT MAXILLOFACIAL WITHOUT CONTRAST CT CERVICAL SPINE WITHOUT CONTRAST TECHNIQUE: Multidetector CT imaging of the maxillofacial structures was performed. Multiplanar CT image reconstructions were also generated. A small metallic BB was placed on the right temple in order to reliably differentiate right from left. Multidetector CT imaging of the cervical spine was performed without intravenous contrast. Multiplanar CT image reconstructions were also generated. COMPARISON:  Head CT 03/02/2020. FINDINGS: CT MAXILLOFACIAL FINDINGS Osseous: No acute displaced fracture. Left mandibular condyle is anteriorly dislocated (sagittal image 76 of series 11). No destructive process. Orbits: Negative. No  traumatic or inflammatory finding. Sinuses: Clear. Soft tissues: A extensive soft tissue swelling in the subcutaneous fat of the left side of the face lateral to the left mandible. Limited intracranial: See separate report for recently obtained head CT dated 03/02/2020 for full description of intracranial findings. CT CERVICAL FINDINGS Alignment: Normal. Skull base and vertebrae: No acute fracture. No primary bone lesion or focal pathologic process. Soft tissues and spinal canal: No prevertebral fluid or swelling. No visible canal hematoma. Disc levels: Mild multilevel degenerative disc disease, most apparent at C5-C6. Mild multilevel facet arthropathy. Upper chest: Unremarkable. Other: None. IMPRESSION: 1. Anterior dislocation of the left mandibular condyle from the temporomandibular joint with extensive overlying soft tissue swelling. 2. No evidence of significant acute traumatic injury to the cervical spine. Electronically Signed   By: Trudie Reed M.D.   On: 03/02/2020 15:14    EKG: Independently reviewed.sinus tachycardia with RBBB  Assessment/Plan Principal Problem:   Severe sepsis in relation to left parotitis- appreciate ENT consult- warm compresses and aggressive massage every 2 hrs recommended - lactic acid 2.1 has improved to 1.7  - f/u blood cultures - stop Vanc and Cefepime and start Unasyn - cont Decadron 8 mg Q 8 hrs x 3 doses - given 2 L of NS in the ED- as he is too lethargic to eat and drink, will continue maintenance fluids  Active Problems:  Acute metabolic encephalopathy - due to acute infection- lethargic/ nonverbal  AKI on CKD 3- dehydration - baseline Cr is ~ 1.8 - Cr today is 3.22- likely prerenal due to poor oral intake - cont IV NS as mentioned above -  hold oral lasix - has h/o grade 1 d cHF per last ECHO  Elevated T bili  - likely secondary to sepsis- follow    COPD GOLD III  - cont O2 to keep pulse ox in low 90s- currently on 4 L - can resume Symbicort  when alert - PRN Nebs ordered  Recent COVID - still COVID postive- follow precautions    Sleep apnea - CPAP when asleep    Rheumatoid arthritis - I cannot tell that he in receiving any DMARDS - with frequent falls- resides at SNF    Morbid obesity due to excess calories  Body mass index is 33.45 kg/m.    History of pulmonary embolism - use lovenox for now and resume Eliquis when alert     Controlled type 2 diabetes mellitus with hyperglycemia (HCC) - hold Glucophage- start SSI  Gout - resume Allopurinol when alert  DVT prophylaxis:  Lovenox Code Status: DNR  Family Communication:   Disposition Plan:  Remains inpatient appropriate because:IV treatments appropriate due to intensity of illness or inability to take PO  Consults called: ENT  Admission status: inpatient     Calvert Cantor MD [facility] Pager: [email or web address] Password [email or web address] 7PM-7AM, please contact night-coverage   03/02/2020, 5:37 PM

## 2020-03-03 DIAGNOSIS — R6520 Severe sepsis without septic shock: Secondary | ICD-10-CM | POA: Diagnosis not present

## 2020-03-03 DIAGNOSIS — A419 Sepsis, unspecified organism: Secondary | ICD-10-CM | POA: Diagnosis not present

## 2020-03-03 LAB — URINE CULTURE: Culture: NO GROWTH

## 2020-03-03 LAB — GLUCOSE, CAPILLARY
Glucose-Capillary: 95 mg/dL (ref 70–99)
Glucose-Capillary: 84 mg/dL (ref 70–99)
Glucose-Capillary: 96 mg/dL (ref 70–99)

## 2020-03-03 LAB — CBC
HCT: 36.5 % — ABNORMAL LOW (ref 39.0–52.0)
Hemoglobin: 11.3 g/dL — ABNORMAL LOW (ref 13.0–17.0)
MCH: 30.8 pg (ref 26.0–34.0)
MCHC: 31 g/dL (ref 30.0–36.0)
MCV: 99.5 fL (ref 80.0–100.0)
Platelets: 131 10*3/uL — ABNORMAL LOW (ref 150–400)
RBC: 3.67 MIL/uL — ABNORMAL LOW (ref 4.22–5.81)
RDW: 17.6 % — ABNORMAL HIGH (ref 11.5–15.5)
WBC: 18.5 10*3/uL — ABNORMAL HIGH (ref 4.0–10.5)
nRBC: 0 % (ref 0.0–0.2)

## 2020-03-03 LAB — HEMOGLOBIN A1C
Hgb A1c MFr Bld: 6 % — ABNORMAL HIGH (ref 4.8–5.6)
Mean Plasma Glucose: 125.5 mg/dL

## 2020-03-03 LAB — TSH: TSH: 1.263 u[IU]/mL (ref 0.350–4.500)

## 2020-03-03 LAB — MRSA PCR SCREENING: MRSA by PCR: POSITIVE — AB

## 2020-03-03 LAB — CBG MONITORING, ED: Glucose-Capillary: 105 mg/dL — ABNORMAL HIGH (ref 70–99)

## 2020-03-03 MED ORDER — SENNOSIDES-DOCUSATE SODIUM 8.6-50 MG PO TABS: 1.0000 | ORAL_TABLET | Freq: Every evening | ORAL | Status: DC | PRN

## 2020-03-03 MED ORDER — MUPIROCIN 2 % EX OINT
1.0000 "application " | TOPICAL_OINTMENT | Freq: Two times a day (BID) | CUTANEOUS | Status: AC
  Administered 2020-03-04 – 2020-03-08 (×10): 1 via NASAL
  Filled 2020-03-03 (×2): qty 22

## 2020-03-03 MED ORDER — ACETAMINOPHEN 325 MG PO TABS
650.0000 mg | ORAL_TABLET | Freq: Four times a day (QID) | ORAL | Status: DC | PRN
  Administered 2020-03-14: 650 mg via ORAL
  Filled 2020-03-03: qty 2

## 2020-03-03 MED ORDER — CHLORHEXIDINE GLUCONATE CLOTH 2 % EX PADS
6.0000 | MEDICATED_PAD | Freq: Every day | CUTANEOUS | Status: DC
  Administered 2020-03-03 – 2020-03-06 (×4): 6 via TOPICAL

## 2020-03-03 MED ORDER — DM-GUAIFENESIN ER 30-600 MG PO TB12
1.0000 | ORAL_TABLET | Freq: Two times a day (BID) | ORAL | Status: DC | PRN
  Filled 2020-03-03: qty 1

## 2020-03-03 MED ORDER — INSULIN ASPART 100 UNIT/ML ~~LOC~~ SOLN
0.0000 [IU] | Freq: Every day | SUBCUTANEOUS | Status: DC
  Administered 2020-03-14: 5 [IU] via SUBCUTANEOUS

## 2020-03-03 MED ORDER — INSULIN ASPART 100 UNIT/ML ~~LOC~~ SOLN
0.0000 [IU] | Freq: Three times a day (TID) | SUBCUTANEOUS | Status: DC
  Administered 2020-03-04 – 2020-03-08 (×3): 2 [IU] via SUBCUTANEOUS
  Administered 2020-03-08: 3 [IU] via SUBCUTANEOUS
  Administered 2020-03-08 – 2020-03-12 (×3): 2 [IU] via SUBCUTANEOUS

## 2020-03-03 NOTE — Progress Notes (Signed)
PROGRESS NOTE    Chad Duke  YYT:[id number] DOB: 1945/08/01 DOA: 03/02/2020 PCP: Soundra Pilon, FNP   Brief Narrative:  75 year old residing at [facility] health care, chronic diastolic CHF, HTN, AAA, PE, rheumatoid arthritis, hypogonadism, depression, COPD on chronic 2 L nasal cannula, OSA, HLD was sent to the hospital for poor oral intake and left-sided facial swelling concerning for parotitis.  Patient was diagnosed of COVID 2 weeks ago and treated at the facility.  Seen by ENT in the ER started on Decadron   Assessment & Plan:   Principal Problem:   Severe sepsis (HCC) Active Problems:   COPD GOLD III    Sleep apnea   Rheumatoid arthritis (HCC)   Morbid obesity due to excess calories (HCC)   History of pulmonary embolism   Acute renal failure superimposed on stage 3 chronic kidney disease (HCC)   Controlled type 2 diabetes mellitus with hyperglycemia (HCC)   Parotitis  Severe sepsis secondary to left parotitis - Sepsis physiology is improving.  Warm compresses.  Aggressive massage - ENT- Unasyn and Decadron - Pain control -CT cervical spine-anterior dislocation of left mandibular condyle with soft tissue swelling  Acute metabolic encephalopathy - CT head negative for acute changes - Ammonia-18, UA-negative  Acute kidney injury on CKD stage IIIa, prerenal - Baseline creatinine 1.8.  Admission creatinine 3.22.  Creatinine this morning 2.7 - Lasix on hold - IV fluids  Recent COVID-19 infection, asymptomatic -Chest x-ray negative for any active infiltrates.  Currently on 4 L nasal cannula  Chronic hypoxia secondary to COPD Abnormal breath sounds with bilateral rhonchi - Home uses 2-3 L nasal cannula. - Continue bronchodilators, already on steroids  History of pulmonary embolism - On home Eliquis "currently on Lovenox due to his mentation  Diabetes mellitus type 2 - Glucophage on hold - Add Accu-Chek and sliding scale  History of gout - Continue  allopurinol    DVT prophylaxis: Lovenox Code Status: DNR Family Communication:  Called Hedi  Status is: Inpatient  Remains inpatient appropriate because:IV treatments appropriate due to intensity of illness or inability to take PO   Dispo: The patient is from: Home              Anticipated d/c is to: SNF              Anticipated d/c date is: 2 days              Patient currently is not medically stable to d/c.  Maintain hospital stay until his p.o. intake is not consistent, he requires IV antibiotics.  In the meantime awaiting his mental status to improve   Difficult to place patient No   Body mass index is 33.45 kg/m.        Subjective: Pleasantly confused, no complaints.   Review of Systems Otherwise negative except as per HPI, including: General: Denies fever, chills, night sweats or unintended weight loss. Resp: Denies cough, wheezing, shortness of breath. Cardiac: Denies chest pain, palpitations, orthopnea, paroxysmal nocturnal dyspnea. GI: Denies abdominal pain, nausea, vomiting, diarrhea or constipation GU: Denies dysuria, frequency, hesitancy or incontinence MS: Denies muscle aches, joint pain or swelling Neuro: Denies headache, neurologic deficits (focal weakness, numbness, tingling), abnormal gait Psych: Denies anxiety, depression, SI/HI/AVH Skin: Denies new rashes or lesions ID: Denies sick contacts, exotic exposures, travel  Examination: Constitutional: Not in acute distress, chronically ill-appearing.  Left-sided facial swelling Respiratory: Diffuse rhonchi bilaterally Cardiovascular: Normal sinus rhythm, no rubs Abdomen: Nontender nondistended good bowel sounds Musculoskeletal: No edema  noted Skin: No rashes seen Neurologic: Pleasantly confused.  No focal neurodeficits Psychiatric: Difficult to assess  Objective: Vitals:   03/03/20 0715 03/03/20 0730 03/03/20 0745 03/03/20 0812  BP: (!) 157/86 (!) 142/99 (!) 168/91 (!) 141/90  Pulse: (!) 102 100  (!) 110 (!) 108  Resp: (!) 22 (!) 22 (!) 27 20  Temp: 97.7 F (36.5 C) 97.7 F (36.5 C) 97.8 F (36.6 C) 98.1 F (36.7 C)  TempSrc:      SpO2: 100% 94% 99% 94%  Weight:      Height:        Intake/Output Summary (Last 24 hours) at 03/03/2020 0849 Last data filed at 03/02/2020 2100 Gross per 24 hour  Intake 1100 ml  Output 1400 ml  Net -300 ml   Filed Weights   03/02/20 1146  Weight: 99.8 kg     Data Reviewed:   CBC: Recent Labs  Lab 03/02/20 1155 03/02/20 1355 03/03/20 0422  WBC 24.0*  --  18.5*  NEUTROABS 18.3*  --   --   HGB 14.3 12.6* 11.3*  HCT 44.8 37.0* 36.5*  MCV 97.6  --  99.5  PLT 191  --  131*   Basic Metabolic Panel: Recent Labs  Lab 03/02/20 1155 03/02/20 1355 03/02/20 1820  NA 146* 146* 147*  K 4.7 4.1 4.1  CL 108  --  114*  CO2 24  --  22  GLUCOSE 146*  --  145*  BUN 49*  --  48*  CREATININE 3.22*  --  2.72*  CALCIUM 9.2  --  8.0*   GFR: Estimated Creatinine Clearance: 26.9 mL/min (A) (by C-G formula based on SCr of 2.72 mg/dL (H)). Liver Function Tests: Recent Labs  Lab 03/02/20 1155 03/02/20 1820  AST 28 25  ALT 26 21  ALKPHOS 102 81  BILITOT 1.5* 1.4*  PROT 7.6 5.7*  ALBUMIN 2.4* 1.9*   Recent Labs  Lab 03/02/20 1155  LIPASE 22   Recent Labs  Lab 03/02/20 1155  AMMONIA 18   Coagulation Profile: Recent Labs  Lab 03/02/20 1155  INR 1.8*   Cardiac Enzymes: Recent Labs  Lab 03/02/20 1155  CKTOTAL 117   BNP (last 3 results) No results for input(s): PROBNP in the last 8760 hours. HbA1C: No results for input(s): HGBA1C in the last 72 hours. CBG: No results for input(s): GLUCAP in the last 168 hours. Lipid Profile: No results for input(s): CHOL, HDL, LDLCALC, TRIG, CHOLHDL, LDLDIRECT in the last 72 hours. Thyroid Function Tests: No results for input(s): TSH, T4TOTAL, FREET4, T3FREE, THYROIDAB in the last 72 hours. Anemia Panel: No results for input(s): VITAMINB12, FOLATE, FERRITIN, TIBC, IRON, RETICCTPCT in  the last 72 hours. Sepsis Labs: Recent Labs  Lab 03/02/20 1155 03/02/20 1639  LATICACIDVEN 2.1* 1.7    Recent Results (from the past 240 hour(s))  Culture, blood (routine x 2)     Status: None (Preliminary result)   Collection Time: 03/02/20 11:43 AM   Specimen: BLOOD  Result Value Ref Range Status   Specimen Description BLOOD RIGHT ANTECUBITAL  Final   Special Requests   Final    BOTTLES DRAWN AEROBIC AND ANAEROBIC Blood Culture adequate volume   Culture   Final    NO GROWTH < 12 HOURS Performed at [facility], [address], [place], [place] [address]    Report Status PENDING  Incomplete  SARS Coronavirus 2 by RT PCR (hospital order, performed in [facility] hospital lab) Nasopharyngeal Nasopharyngeal Swab  Status: Abnormal   Collection Time: 03/02/20 11:46 AM   Specimen: Nasopharyngeal Swab  Result Value Ref Range Status   SARS Coronavirus 2 POSITIVE (A) NEGATIVE Final    Comment: RESULT CALLED TO, READ BACK BY AND VERIFIED WITH: RN Colonel Bald [phone] AT 1405 BY CM (NOTE) SARS-CoV-2 target nucleic acids are DETECTED  SARS-CoV-2 RNA is generally detectable in upper respiratory specimens  during the acute phase of infection.  Positive results are indicative  of the presence of the identified virus, but do not rule out bacterial infection or co-infection with other pathogens not detected by the test.  Clinical correlation with patient history and  other diagnostic information is necessary to determine patient infection status.  The expected result is negative.  Fact Sheet for Patients:   BoilerBrush.com.cy   Fact Sheet for Healthcare Providers:   https://pope.com/    This test is not yet approved or cleared by the [place] FDA and  has been authorized for detection and/or diagnosis of SARS-CoV-2 by FDA under an Emergency Use Authorization (EUA).  This EUA will remain in effect (meaning this test c an be used)  for the duration of  the COVID-19 declaration under Section 564(b)(1) of the Act, 21 U.S.C. section 360-bbb-3(b)(1), unless the authorization is terminated or revoked sooner.  Performed at [facility], [address], [place], [place] [address]          Radiology Studies: CT Head Wo Contrast  Result Date: 03/02/2020 CLINICAL DATA:  "Head trauma, minor". EXAM: CT HEAD WITHOUT CONTRAST TECHNIQUE: Contiguous axial images were obtained from the base of the skull through the vertex without intravenous contrast. COMPARISON:  02/20/2020. FINDINGS: Brain: Advanced cerebral and cerebellar atrophy. Moderate low density in the periventricular white matter likely related to small vessel disease. No mass lesion, hemorrhage, hydrocephalus, acute infarct, intra-axial, or extra-axial fluid collection. Vascular: Intracranial atherosclerosis. Skull: No scalp soft tissue swelling.  No skull fracture. Sinuses/Orbits: Evaluated on dedicated face CT, dictated separately. Other: None. IMPRESSION: 1. No acute intracranial abnormality. 2. Cerebral atrophy and small vessel ischemic change. Electronically Signed   By: Jeronimo Greaves M.D.   On: 03/02/2020 14:53   CT Cervical Spine Wo Contrast  Result Date: 03/02/2020 CLINICAL DATA:  75 year old male with history of neck trauma from a fall on 02/28/2020. On Eliquis. Swelling in the left side of face. EXAM: CT MAXILLOFACIAL WITHOUT CONTRAST CT CERVICAL SPINE WITHOUT CONTRAST TECHNIQUE: Multidetector CT imaging of the maxillofacial structures was performed. Multiplanar CT image reconstructions were also generated. A small metallic BB was placed on the right temple in order to reliably differentiate right from left. Multidetector CT imaging of the cervical spine was performed without intravenous contrast. Multiplanar CT image reconstructions were also generated. COMPARISON:  Head CT 03/02/2020. FINDINGS: CT MAXILLOFACIAL FINDINGS Osseous: No acute displaced fracture. Left  mandibular condyle is anteriorly dislocated (sagittal image 76 of series 11). No destructive process. Orbits: Negative. No traumatic or inflammatory finding. Sinuses: Clear. Soft tissues: A extensive soft tissue swelling in the subcutaneous fat of the left side of the face lateral to the left mandible. Limited intracranial: See separate report for recently obtained head CT dated 03/02/2020 for full description of intracranial findings. CT CERVICAL FINDINGS Alignment: Normal. Skull base and vertebrae: No acute fracture. No primary bone lesion or focal pathologic process. Soft tissues and spinal canal: No prevertebral fluid or swelling. No visible canal hematoma. Disc levels: Mild multilevel degenerative disc disease, most apparent at C5-C6. Mild multilevel facet arthropathy. Upper  chest: Unremarkable. Other: None. IMPRESSION: 1. Anterior dislocation of the left mandibular condyle from the temporomandibular joint with extensive overlying soft tissue swelling. 2. No evidence of significant acute traumatic injury to the cervical spine. Electronically Signed   By: Trudie Reed M.D.   On: 03/02/2020 15:14   DG Chest Port 1 View  Result Date: 03/02/2020 CLINICAL DATA:  Dyspnea EXAM: PORTABLE CHEST 1 VIEW COMPARISON:  January 21, 2020 FINDINGS: The heart size and mediastinal contours are stable. The aorta is tortuous. Both lungs are clear. Chronic deformity of several lower right ribs are noted. IMPRESSION: No active disease. Electronically Signed   By: Sherian Rein M.D.   On: 03/02/2020 12:28   CT MAXILLOFACIAL WO CONTRAST  Result Date: 03/02/2020 CLINICAL DATA:  75 year old male with history of neck trauma from a fall on 02/28/2020. On Eliquis. Swelling in the left side of face. EXAM: CT MAXILLOFACIAL WITHOUT CONTRAST CT CERVICAL SPINE WITHOUT CONTRAST TECHNIQUE: Multidetector CT imaging of the maxillofacial structures was performed. Multiplanar CT image reconstructions were also generated. A small metallic  BB was placed on the right temple in order to reliably differentiate right from left. Multidetector CT imaging of the cervical spine was performed without intravenous contrast. Multiplanar CT image reconstructions were also generated. COMPARISON:  Head CT 03/02/2020. FINDINGS: CT MAXILLOFACIAL FINDINGS Osseous: No acute displaced fracture. Left mandibular condyle is anteriorly dislocated (sagittal image 76 of series 11). No destructive process. Orbits: Negative. No traumatic or inflammatory finding. Sinuses: Clear. Soft tissues: A extensive soft tissue swelling in the subcutaneous fat of the left side of the face lateral to the left mandible. Limited intracranial: See separate report for recently obtained head CT dated 03/02/2020 for full description of intracranial findings. CT CERVICAL FINDINGS Alignment: Normal. Skull base and vertebrae: No acute fracture. No primary bone lesion or focal pathologic process. Soft tissues and spinal canal: No prevertebral fluid or swelling. No visible canal hematoma. Disc levels: Mild multilevel degenerative disc disease, most apparent at C5-C6. Mild multilevel facet arthropathy. Upper chest: Unremarkable. Other: None. IMPRESSION: 1. Anterior dislocation of the left mandibular condyle from the temporomandibular joint with extensive overlying soft tissue swelling. 2. No evidence of significant acute traumatic injury to the cervical spine. Electronically Signed   By: Trudie Reed M.D.   On: 03/02/2020 15:14        Scheduled Meds: . budesonide  1 puff Inhalation BID  . dexamethasone (DECADRON) injection  8 mg Intravenous Q8H  . enoxaparin (LOVENOX) injection  1 mg/kg Subcutaneous Q24H  . Ipratropium-Albuterol  1 puff Inhalation QID   Continuous Infusions: . sodium chloride 75 mL/hr at 03/03/20 0109  . ampicillin-sulbactam (UNASYN) IV Stopped (03/03/20 0649)     LOS: 1 day   Time spent= 35 mins    Mekisha Bittel Joline Maxcy, MD [facility]  If 7PM-7AM,  please contact night-coverage  03/03/2020, 8:49 AM

## 2020-03-03 NOTE — Progress Notes (Signed)
ENT PROGRESS NOTE   Subjective: Patient seen and examined at bedside. Patient remains in ED. Still with altered mental status, unable to answer questions appropriately.   Objective: Vital signs in last 24 hours: Temp:  [96.3 F (35.7 C)-101 F (38.3 C)] 98.1 F (36.7 C) (01/23 1030) Pulse Rate:  [30-130] 104 (01/23 1030) Resp:  [16-47] 23 (01/23 1030) BP: (88-191)/(66-145) 133/88 (01/23 1030) SpO2:  [87 %-100 %] 100 % (01/23 1030) Weight:  [99.8 kg] 99.8 kg (01/22 1146)  PHYSICAL EXAM: CONSTITUTIONAL: Obese male, ill-appearing, in no acute distress, confused. HENT: Head : normocephalic and atraumatic Mouth/Throat:  Mouth: Extremely dry oral mucosa with scabbing and dried blood noted. Multiple missing teeth, remaining dentition poor. Purulence expressed from left Stenson's duct with massage of left parotid. No deviation of chin with mouth opening, patient able to open and close his mouth.  Neck: Edema and erythema of left parotid gland with perimandibular induration, tender to palpation. Edema improved from previous exam.    Recent Labs    03/02/20 1155 03/02/20 1355 03/02/20 1820  NA 146* 146* 147*  K 4.7 4.1 4.1  CL 108  --  114*  CO2 24  --  22  GLUCOSE 146*  --  145*  BUN 49*  --  48*  CREATININE 3.22*  --  2.72*  CALCIUM 9.2  --  8.0*   Lab Results  Component Value Date   WBC 18.5 (H) 03/03/2020   HGB 11.3 (L) 03/03/2020   HCT 36.5 (L) 03/03/2020   MCV 99.5 03/03/2020   PLT 131 (L) 03/03/2020    Medications: I have reviewed the patient's current medications.  New Imaging: None  Assessment/Plan: Chad Duke is a 75 y/o M with recent history of Covid 19-infection, with AMS, dehydration and left acute parotitis. - No acute intervention of anteriorly subluxed left mandible. Difficult to determine if this is secondary to severe edema or from recent dislocation. No chin deviation on exam, patient able to close mouth, although unable to determine occlusion due to  absent dentition.  - Continue IV antibiotics - Recommend IV Decadron 8mg  Q8 H for 3 total doses - Recommend application of warm compresses to left face with aggressive massage every 2 hours - this is must be done consistently. Persistent purulent drainage on exam today. - Recommend frequent oral care - Recommend aggressive oral hydration and frequent use of sialogogues: lemon slices or sour sugar free candy when stable for PO intake - Medical management as per primary team  - Will continue to follow peripherally. If patient has difficulty with mouth closure or there is persistent concern for subluxation of mandible following resolution of acute infectious process, consider oral surgery consultation for evaluation.   LOS: 1 day   Thank you for allowing me to participate in the care of this patient. Please do not hesitate to contact me with any questions or concerns.   , DO Otolaryngology [facility] ENT Cell: [phone] 03/03/2020, 10:47 AM

## 2020-03-03 NOTE — Progress Notes (Signed)
Pt arrived to the unit at 1730 from ED. Pt is alert but disoriented, pleasant at this time, pulling at cords and lines with mitts on both hands. Skin check was done with charge nurse Denny Peon RN.

## 2020-03-03 NOTE — ED Notes (Signed)
Safety Mitten ordered @ 1258-per RN ordered by Marylene Land

## 2020-03-03 NOTE — Progress Notes (Signed)
Pt unable to wear CPAP due to in mitts.

## 2020-03-04 ENCOUNTER — Ambulatory Visit: Payer: Medicare Other | Admitting: Pulmonary Disease

## 2020-03-04 DIAGNOSIS — R6520 Severe sepsis without septic shock: Secondary | ICD-10-CM | POA: Diagnosis not present

## 2020-03-04 DIAGNOSIS — A419 Sepsis, unspecified organism: Secondary | ICD-10-CM | POA: Diagnosis not present

## 2020-03-04 LAB — BASIC METABOLIC PANEL
Anion gap: 12 (ref 5–15)
BUN: 47 mg/dL — ABNORMAL HIGH (ref 8–23)
CO2: 23 mmol/L (ref 22–32)
Calcium: 8.7 mg/dL — ABNORMAL LOW (ref 8.9–10.3)
Chloride: 116 mmol/L — ABNORMAL HIGH (ref 98–111)
Creatinine, Ser: 1.72 mg/dL — ABNORMAL HIGH (ref 0.61–1.24)
GFR, Estimated: 41 mL/min — ABNORMAL LOW (ref >60)
Glucose, Bld: 99 mg/dL (ref 70–99)
Potassium: 3.1 mmol/L — ABNORMAL LOW (ref 3.5–5.1)
Sodium: 151 mmol/L — ABNORMAL HIGH (ref 135–145)

## 2020-03-04 LAB — BASIC METABOLIC PANEL WITH GFR
Anion gap: 12 (ref 5–15)
BUN: 48 mg/dL — ABNORMAL HIGH (ref 8–23)
CO2: 23 mmol/L (ref 22–32)
Calcium: 8.5 mg/dL — ABNORMAL LOW (ref 8.9–10.3)
Chloride: 114 mmol/L — ABNORMAL HIGH (ref 98–111)
Creatinine, Ser: 1.96 mg/dL — ABNORMAL HIGH (ref 0.61–1.24)
GFR, Estimated: 35 mL/min — ABNORMAL LOW
Glucose, Bld: 94 mg/dL (ref 70–99)
Potassium: 2.8 mmol/L — ABNORMAL LOW (ref 3.5–5.1)
Sodium: 149 mmol/L — ABNORMAL HIGH (ref 135–145)

## 2020-03-04 LAB — GLUCOSE, CAPILLARY
Glucose-Capillary: 111 mg/dL — ABNORMAL HIGH (ref 70–99)
Glucose-Capillary: 147 mg/dL — ABNORMAL HIGH (ref 70–99)
Glucose-Capillary: 79 mg/dL (ref 70–99)
Glucose-Capillary: 84 mg/dL (ref 70–99)
Glucose-Capillary: 86 mg/dL (ref 70–99)
Glucose-Capillary: 92 mg/dL (ref 70–99)
Glucose-Capillary: 95 mg/dL (ref 70–99)
Glucose-Capillary: 95 mg/dL (ref 70–99)

## 2020-03-04 LAB — MAGNESIUM: Magnesium: 1.7 mg/dL (ref 1.7–2.4)

## 2020-03-04 LAB — CBC
HCT: 33.3 % — ABNORMAL LOW (ref 39.0–52.0)
Hemoglobin: 10.3 g/dL — ABNORMAL LOW (ref 13.0–17.0)
MCH: 30.8 pg (ref 26.0–34.0)
MCHC: 30.9 g/dL (ref 30.0–36.0)
MCV: 99.7 fL (ref 80.0–100.0)
Platelets: 125 10*3/uL — ABNORMAL LOW (ref 150–400)
RBC: 3.34 MIL/uL — ABNORMAL LOW (ref 4.22–5.81)
RDW: 17.2 % — ABNORMAL HIGH (ref 11.5–15.5)
WBC: 13.9 10*3/uL — ABNORMAL HIGH (ref 4.0–10.5)
nRBC: 0 % (ref 0.0–0.2)

## 2020-03-04 LAB — BRAIN NATRIURETIC PEPTIDE: B Natriuretic Peptide: 178.1 pg/mL — ABNORMAL HIGH (ref 0.0–100.0)

## 2020-03-04 LAB — PROCALCITONIN: Procalcitonin: 0.64 ng/mL

## 2020-03-04 MED ORDER — SODIUM CHLORIDE 0.9 % IV SOLN
3.0000 g | Freq: Three times a day (TID) | INTRAVENOUS | Status: DC
  Administered 2020-03-04 – 2020-03-08 (×12): 3 g via INTRAVENOUS
  Filled 2020-03-04 (×4): qty 8
  Filled 2020-03-04: qty 3
  Filled 2020-03-04 (×8): qty 8

## 2020-03-04 MED ORDER — HALOPERIDOL LACTATE 5 MG/ML IJ SOLN
2.0000 mg | Freq: Three times a day (TID) | INTRAMUSCULAR | Status: AC | PRN
  Administered 2020-03-05: 2 mg via INTRAVENOUS
  Filled 2020-03-04: qty 1

## 2020-03-04 MED ORDER — POTASSIUM CHLORIDE 10 MEQ/100ML IV SOLN
10.0000 meq | INTRAVENOUS | Status: AC
  Administered 2020-03-04 (×3): 10 meq via INTRAVENOUS
  Filled 2020-03-04 (×3): qty 100

## 2020-03-04 MED ORDER — MAGNESIUM SULFATE 2 GM/50ML IV SOLN
2.0000 g | Freq: Once | INTRAVENOUS | Status: AC
  Administered 2020-03-04: 2 g via INTRAVENOUS
  Filled 2020-03-04: qty 50

## 2020-03-04 MED ORDER — POTASSIUM CHLORIDE 10 MEQ/100ML IV SOLN: 10.0000 meq | INTRAVENOUS | Status: DC

## 2020-03-04 MED ORDER — POTASSIUM CHLORIDE CRYS ER 20 MEQ PO TBCR
40.0000 meq | EXTENDED_RELEASE_TABLET | ORAL | Status: AC
  Administered 2020-03-04 (×2): 40 meq via ORAL
  Filled 2020-03-04 (×2): qty 2

## 2020-03-04 MED ORDER — ENSURE ENLIVE PO LIQD
237.0000 mL | Freq: Two times a day (BID) | ORAL | Status: DC
  Administered 2020-03-04 – 2020-03-13 (×16): 237 mL via ORAL

## 2020-03-04 MED ORDER — DEXTROSE-NACL 5-0.45 % IV SOLN: INTRAVENOUS | Status: DC

## 2020-03-04 MED ORDER — ENOXAPARIN SODIUM 100 MG/ML ~~LOC~~ SOLN
1.0000 mg/kg | Freq: Two times a day (BID) | SUBCUTANEOUS | Status: DC
  Administered 2020-03-04 – 2020-03-07 (×7): 100 mg via SUBCUTANEOUS
  Filled 2020-03-04 (×7): qty 1

## 2020-03-04 MED ORDER — DIPHENHYDRAMINE HCL 50 MG/ML IJ SOLN
25.0000 mg | Freq: Once | INTRAMUSCULAR | Status: AC
  Administered 2020-03-04: 25 mg via INTRAVENOUS
  Filled 2020-03-04: qty 1

## 2020-03-04 NOTE — Progress Notes (Signed)
PHARMACY NOTE:  ANTIMICROBIAL RENAL DOSAGE ADJUSTMENT  Current antimicrobial regimen includes a mismatch between antimicrobial dosage and estimated renal function.  As per policy approved by the Pharmacy & Therapeutics and Medical Executive Committees, the antimicrobial dosage will be adjusted accordingly.  Current antimicrobial dosage:  Unasyn 3gm IV q12h  Indication: Parotitis  Renal Function:  Estimated Creatinine Clearance: 36.8 mL/min (A) (by C-G formula based on SCr of 1.96 mg/dL (H)). []      On intermittent HD, scheduled: []      On CRRT    Antimicrobial dosage has been changed to:  Unasyn 3gm IV q8h  Additional comments: Scr downtrending, Will adjust. Enoxaparin adjusted for improved renal function as well.    Marin Milley A. , PharmD, BCPS, FNKF Clinical Pharmacist [place] Please utilize Amion for appropriate phone number to reach the unit pharmacist [facility])   03/04/2020 9:07 AM

## 2020-03-04 NOTE — Progress Notes (Signed)
PT Cancellation Note  Patient Details Name: Chad Duke MRN: [id number] DOB: 12-28-1945   Cancelled Treatment:    Reason Eval/Treat Not Completed: (P) Medical issues which prohibited therapy;Active bedrest order Pt has active bed rest orders and currently has restraints in place. RN request PT follow back tomorrow for Evaluation.   [name] B. Beverely Risen PT, DPT Acute Rehabilitation Services Pager [phone] Office [phone]     [name] [facility] 03/04/2020, 9:41 AM

## 2020-03-04 NOTE — Evaluation (Signed)
Clinical/Bedside Swallow Evaluation Patient Details  Name: Chad Duke MRN: [id number] Date of Birth: 1945/10/02  Today's Date: 03/04/2020 Time: SLP Start Time (ACUTE ONLY): 1030 SLP Stop Time (ACUTE ONLY): 1100 SLP Time Calculation (min) (ACUTE ONLY): 30 min  Past Medical History:  Past Medical History:  Diagnosis Date  . AAA (abdominal aortic aneurysm) (HCC)   . Allergy    Rhinitis  . Arthritis    Rhuematoid  . Asthma   . CHF (congestive heart failure) (HCC)    chronic diastolic (congestive) heart failure from notes from [facility] face sheet  . Chronic renal insufficiency   . Depression    per notes from [facility] on chart  . Diverticulosis   . Emphysema   . Falls frequently   . GERD (gastroesophageal reflux disease)   . Hearing loss of both ears   . Hiatal hernia   . Hyperlipidemia   . Hypertension   . Male hypogonadism   . Nephrolithiasis   . Nephrolithiasis   . Sleep apnea    On CPAP  . Stasis dermatitis   . Varicose veins   . Vitamin B 12 deficiency    Past Surgical History:  Past Surgical History:  Procedure Laterality Date  . CARPAL TUNNEL RELEASE     bilateral  . CYSTOSCOPY/URETEROSCOPY/HOLMIUM LASER/STENT PLACEMENT Left 11/02/2018   Procedure: CYSTOSCOPY/URETEROSCOPY/HOLMIUM LASER/STENT PLACEMENT;  Surgeon: Crista Elliot, MD;  Location: [facility] ORS;  Service: Urology;  Laterality: Left;  . FRACTURE SURGERY Left    Fibula  . JOINT REPLACEMENT Left    Shoulder  . JOINT REPLACEMENT Left    Hip   . KIDNEY STONE SURGERY  2003  . left hip replacement    . left knee    . left shoulder    . LITHOTRIPSY     HPI:  Chad Duke is a 75 y/o M with recent history of Covid 19-infection, with AMS, dehydration and left acute parotitis, poor oral intake. Per ENT- No acute intervention of anteriorly subluxed left mandible. Difficult to determine if this is secondary to severe edema or from recent dislocation. ENT requested heated compress, massage and oral  hydration when appropriate.   Assessment / Plan / Recommendation Clinical Impression  Pt initially very impaired by severe oral dryness and debris around the floor of the mouth. Significant oral care provided and pt much more intelligible. He initially needed cues to comprehend oral intake as he perseverated on oral care. Eventually pt taking sips of water with a brief oral hold, no signs of aspiration. Able to tolerate purees well. Also demonstrated mastication and denied pain, but will proceed initially with puree and thin liquids to limit need to masticate. Will f/u for tolerance and advancement as appropriate. SLP Visit Diagnosis: Dysphagia, unspecified (R13.10)    Aspiration Risk  Mild aspiration risk    Diet Recommendation Dysphagia 1 (Puree);Thin liquid   Liquid Administration via: Cup;Straw Medication Administration: Whole meds with puree Supervision: Staff to assist with self feeding Compensations: Slow rate;Small sips/bites Postural Changes: Seated upright at 90 degrees    Other  Recommendations Oral Care Recommendations: Oral care BID Other Recommendations: Have oral suction available   Follow up Recommendations Skilled Nursing facility;24 hour supervision/assistance      Frequency and Duration min 2x/week  2 weeks       Prognosis Prognosis for Safe Diet Advancement: Good Barriers to Reach Goals: Severity of deficits      Swallow Study   General HPI: Chad Duke is a 75 y/o M  with recent history of Covid 19-infection, with AMS, dehydration and left acute parotitis, poor oral intake. Per ENT- No acute intervention of anteriorly subluxed left mandible. Difficult to determine if this is secondary to severe edema or from recent dislocation. ENT requested heated compress, massage and oral hydration when appropriate. Type of Study: Bedside Swallow Evaluation Previous Swallow Assessment: none Diet Prior to this Study: NPO Temperature Spikes Noted: No Respiratory Status:  Room air History of Recent Intubation: No Behavior/Cognition: Alert;Cooperative;Requires cueing Oral Cavity Assessment: Dry;Dried secretions;Lesions Oral Care Completed by SLP: Yes Oral Cavity - Dentition: Poor condition Self-Feeding Abilities: Total assist;Needs assist Patient Positioning: Upright in bed Baseline Vocal Quality: Normal Volitional Cough: Strong Volitional Swallow: Able to elicit    Oral/Motor/Sensory Function Overall Oral Motor/Sensory Function: Within functional limits   Ice Chips Ice chips: Within functional limits   Thin Liquid Thin Liquid: Within functional limits    Nectar Thick Nectar Thick Liquid: Not tested   Honey Thick Honey Thick Liquid: Not tested   Puree Puree: Within functional limits   Solid     Solid: Within functional limits     Harlon Ditty, MA CCC-SLP  Acute Rehabilitation Services Pager [phone] Office [phone]  Claudine Mouton 03/04/2020,3:29 PM

## 2020-03-04 NOTE — Progress Notes (Signed)
Pt unable to wear cpap due to mitts

## 2020-03-04 NOTE — Evaluation (Signed)
Physical Therapy Evaluation Patient Details Name: Chad Duke MRN: [id number] DOB: 05/29/1945 Today's Date: 03/04/2020   History of Present Illness  75 y.o. male who lives at [facility] care and has a h/o chronic diastolic CHF, AAA, HTN, HLD, PE, rheumatoid arthritis, hypogonadism, depression, nephrolithiasis, COPD on 2 L O2 and OSA. In ED 02/20/20 for 2x fall and AMS. Sent from SNF [facility]) 03/02/20 for confusion and progressively poor oral intake. In ED found to have a left facial swelling, is febrile and tachycardic. Niece mentioned that pt was diagnosed COVID+ 2 weeks ago at facility. Released from COVID isolation at his facility a week ago. Admitted for treatment of severe sepsis secondary to L parotitis and acute metabolic encephalopathy.  Clinical Impression  NT feeding pt on entry. Pt oriented to self, able to state name. Evaluation limited due to pt still in restraints and not currently safe to trial without due to decreased command follow and increased impulsivity. Pt with sporadic command follow while assessing LE. LE ROM near full assessed from following commands and then kicking at therapist while trying to assess strength. Given pt current deficits and decreased safety pt will need to return to SNF level rehab at discharge. Hopefully, pt confusion will clear and he will be able to participate more fully in therapy sessions.      Follow Up Recommendations SNF    Equipment Recommendations  None recommended by PT    Recommendations for Other Services OT consult     Precautions / Restrictions Precautions Precautions: Fall Precaution Comments: 2 documented falls during ED visit 02/20/20      Mobility  Bed Mobility               General bed mobility comments: deferred due to impulsivity and decreased command follow                         Pertinent Vitals/Pain Pain Assessment: Faces Faces Pain Scale: Hurts a little bit Pain Location:  generalized with movement Pain Descriptors / Indicators: Grimacing;Guarding Pain Intervention(s): Monitored during session    Home Living Family/patient expects to be discharged to:: Skilled nursing facility                      Prior Function           Comments: pt unable to state        Extremity/Trunk Assessment   Upper Extremity Assessment Upper Extremity Assessment: Difficult to assess due to impaired cognition (deferred due to restraints and mittens and RN request to remain in place)    Lower Extremity Assessment Lower Extremity Assessment: Difficult to assess due to impaired cognition;RLE deficits/detail;LLE deficits/detail RLE Deficits / Details: Full ROM of hip, knee and ankle, difficult to assess strength LLE Deficits / Details: Full ROM of hip, knee and ankle, difficult to assess strength       Communication   Communication: Expressive difficulties;Other (comment) (vasilates between comprehendable speech and nonsense speech.)  Cognition Arousal/Alertness: Awake/alert Behavior During Therapy: Agitated;Impulsive;Restless Overall Cognitive Status: History of cognitive impairments - at baseline Area of Impairment: Orientation;Memory;Following commands;Attention;Safety/judgement;Awareness;Problem solving                 Orientation Level: Disoriented to;Place;Time;Situation Current Attention Level: Focused Memory: Decreased short-term memory Following Commands: Follows one step commands inconsistently;Follows multi-step commands inconsistently Safety/Judgement: Decreased awareness of safety;Decreased awareness of deficits Awareness: Intellectual Problem Solving: Requires verbal cues;Requires tactile cues;Difficulty sequencing;Decreased initiation General Comments: pt  with occassional correct answer to questions, sometime completely wrong, and sometimes states wrong thing but knows the correct answer for instance when asked if he is in a hospital,  church or school, he states church and winks at PT, able to follow some simple commands for specific LE movement then devolves to kicking in therapist direction      General Comments General comments (skin integrity, edema, etc.): VSS on RA        Assessment/Plan    PT Assessment Patient needs continued PT services  PT Problem List Decreased safety awareness;Decreased cognition;Decreased knowledge of precautions;Decreased mobility;Decreased coordination       PT Treatment Interventions Gait training;Functional mobility training;Therapeutic activities;Therapeutic exercise;Balance training;Neuromuscular re-education;Cognitive remediation    PT Goals (Current goals can be found in the Care Plan section)  Acute Rehab PT Goals Patient Stated Goal: unable to state PT Goal Formulation: Patient unable to participate in goal setting Time For Goal Achievement: 03/18/20 Potential to Achieve Goals: Poor    Frequency Min 2X/week   Barriers to discharge        Co-evaluation               AM-PAC PT "6 Clicks" Mobility  Outcome Measure Help needed turning from your back to your side while in a flat bed without using bedrails?: Total Help needed moving from lying on your back to sitting on the side of a flat bed without using bedrails?: Total Help needed moving to and from a bed to a chair (including a wheelchair)?: Total Help needed standing up from a chair using your arms (e.g., wheelchair or bedside chair)?: Total Help needed to walk in hospital room?: Total Help needed climbing 3-5 steps with a railing? : Total 6 Click Score: 6    End of Session   Activity Tolerance: Treatment limited secondary to agitation Patient left: in bed;with call bell/phone within reach;with bed alarm set;with nursing/sitter in room;with restraints reapplied Nurse Communication: Other (comment) (decreased Evaluation) PT Visit Diagnosis: Other abnormalities of gait and mobility (R26.89);Muscle weakness  (generalized) (M62.81);Difficulty in walking, not elsewhere classified (R26.2);Repeated falls (R29.6);History of falling (Z91.81);Unsteadiness on feet (R26.81);Adult, failure to thrive (R62.7)    Time: 6333-5456 PT Time Calculation (min) (ACUTE ONLY): 19 min   Charges:   PT Evaluation $PT Eval Moderate Complexity: 1 Mod          Anastasiya Gowin B. Beverely Risen PT, DPT Acute Rehabilitation Services Pager [phone] Office [phone]   [name] [facility] 03/04/2020, 4:17 PM

## 2020-03-04 NOTE — Progress Notes (Signed)
Initial Nutrition Assessment  DOCUMENTATION CODES:   Obesity unspecified  INTERVENTION:  Provide Ensure Enlive po BID, each supplement provides 350 kcal and 20 grams of protein  Encourage adequate PO intake.   NUTRITION DIAGNOSIS:   Increased nutrient needs related to catabolic illness (COVID+) as evidenced by estimated needs.  GOAL:   Patient will meet greater than or equal to 90% of their needs  MONITOR:   PO intake,Supplement acceptance,Skin,Weight trends,Labs,I & O's  REASON FOR ASSESSMENT:   Malnutrition Screening Tool    ASSESSMENT:   75 year old residing at [facility] health care, chronic diastolic CHF, HTN, AAA, PE, rheumatoid arthritis, hypogonadism, depression, COPD on chronic 2 L nasal cannula, OSA, HLD was sent to the hospital for poor oral intake and left-sided facial swelling concerning for parotitis. Pt COVID positive. Pt with Severe sepsis secondary to left parotitis.  Pt unavailable during attempted time of contact. RD unable to obtain pt nutrition history at this time. Diet has been advanced to a dysphagia 1 diet with thin liquids. Meal completion has been 50%. RD to order nutritional supplements to aid in caloric and protein needs. Unable to complete Nutrition-Focused physical exam at this time.   Labs and medications reviewed.   Diet Order:   Diet Order            DIET - DYS 1 Room service appropriate? Yes; Fluid consistency: Thin  Diet effective now                 EDUCATION NEEDS:   Not appropriate for education at this time  Skin:  Skin Assessment: Reviewed RN Assessment  Last BM:  Unknown  Height:   Ht Readings from Last 1 Encounters:  03/02/20 5\' 8"  (1.727 m)    Weight:   Wt Readings from Last 1 Encounters:  03/04/20 97.2 kg    BMI:  Body mass index is 32.58 kg/m.  Estimated Nutritional Needs:   Kcal:  2000-2200  Protein:  105-120 grams  Fluid:  >/= 2 L/day  03/06/20, MS, RD, LDN RD pager number/after hours  weekend pager number on Amion.

## 2020-03-04 NOTE — Progress Notes (Signed)
PROGRESS NOTE    Chad Duke  UXL:[id number] DOB: 1945/12/09 DOA: 03/02/2020 PCP: Soundra Pilon, FNP   Brief Narrative:  75 year old residing at [facility] health care, chronic diastolic CHF, HTN, AAA, PE, rheumatoid arthritis, hypogonadism, depression, COPD on chronic 2 L nasal cannula, OSA, HLD was sent to the hospital for poor oral intake and left-sided facial swelling concerning for parotitis.  Patient was diagnosed of COVID 2 weeks ago and treated at the facility.  Seen by ENT in the ER started on Decadron; warm compresses and Unasyn.    Assessment & Plan:   Principal Problem:   Severe sepsis (HCC) Active Problems:   COPD GOLD III    Sleep apnea   Rheumatoid arthritis (HCC)   Morbid obesity due to excess calories (HCC)   History of pulmonary embolism   Acute renal failure superimposed on stage 3 chronic kidney disease (HCC)   Controlled type 2 diabetes mellitus with hyperglycemia (HCC)   Parotitis  Severe sepsis secondary to left parotitis - Sepsis physiology is improving.  Warm compresses.  Aggressive massage - ENT- Unasyn and Decadron - Pain control -CT cervical spine-anterior dislocation of left mandibular condyle with soft tissue swelling  Acute metabolic encephalopathy - CT head negative for acute changes - Ammonia-18, UA-negative  Acute kidney injury on CKD stage IIIa, prerenal - Baseline creatinine 1.8.  Admission creatinine 3.22.  Creatinine this morning 1.96 - Lasix on hold  Dysphagia likely from peritonitis -Currently he is n.p.o.  Speech and swallow evaluation has been ordered. D5 1/2 NS, cont accuchecks.  We will advance diet appropriately as recommended by speech  Hypokalemia -Repletion ordered.  Will repeat labs   Recent COVID-19 infection, asymptomatic -Chest x-ray negative for any active infiltrates.  Currently on 4 L nasal cannula  Chronic hypoxia secondary to COPD Abnormal breath sounds with bilateral rhonchi - Home uses 2-3 L nasal  cannula. - Continue bronchodilators, already on steroids  History of pulmonary embolism - On home Eliquis "currently on Lovenox due to his mentation  Diabetes mellitus type 2 - Glucophage on hold - Add Accu-Chek and sliding scale  History of gout - Continue allopurinol  DVT prophylaxis: Lovenox Code Status: DNR Family Communication:  Called Hedi 1/24  Status is: Inpatient  Remains inpatient appropriate because:IV treatments appropriate due to intensity of illness or inability to take PO   Dispo: The patient is from: Home              Anticipated d/c is to: SNF              Anticipated d/c date is: 2 days              Patient currently is not medically stable to d/c.  Maintain hospital stay until his p.o. intake is not consistent, he requires IV antibiotics.  In the meantime awaiting his mental status to improve   Difficult to place patient No   Body mass index is 32.58 kg/m.    Subjective: Over night patient had episodes of agitation requiring restraints.  When I saw him at bedside he was alert to name but not answering anything else appropriately.  Review of Systems Otherwise negative except as per HPI, including: Difficult to assess due to his agitation.  Examination: Constitutional: Not in acute distress, left-sided facial swelling Respiratory: Clear to auscultation bilaterally Cardiovascular: Normal sinus rhythm, no rubs Abdomen: Nontender nondistended good bowel sounds Musculoskeletal: No edema noted Skin: No rashes seen Neurologic: Exam is nonfocal.  Alert to his name  only Psychiatric: Poor judgment and insight  Objective: Vitals:   03/03/20 2005 03/03/20 2318 03/04/20 0318 03/04/20 0753  BP: 136/81 122/75 132/83 (!) 146/87  Pulse: (!) 104 97 100 99  Resp: 20 17 (!) 28 20  Temp: 97.9 F (36.6 C) 97.7 F (36.5 C) 98.4 F (36.9 C) 98 F (36.7 C)  TempSrc: Axillary Axillary Axillary Oral  SpO2: 92% 100% 100% 100%  Weight:   97.2 kg   Height:         Intake/Output Summary (Last 24 hours) at 03/04/2020 0830 Last data filed at 03/04/2020 0400 Gross per 24 hour  Intake 1081.82 ml  Output 1800 ml  Net -718.18 ml   Filed Weights   03/02/20 1146 03/04/20 0318  Weight: 99.8 kg 97.2 kg     Data Reviewed:   CBC: Recent Labs  Lab 03/02/20 1155 03/02/20 1355 03/03/20 0422 03/04/20 0131  WBC 24.0*  --  18.5* 13.9*  NEUTROABS 18.3*  --   --   --   HGB 14.3 12.6* 11.3* 10.3*  HCT 44.8 37.0* 36.5* 33.3*  MCV 97.6  --  99.5 99.7  PLT 191  --  131* 125*   Basic Metabolic Panel: Recent Labs  Lab 03/02/20 1155 03/02/20 1355 03/02/20 1820 03/04/20 0131  NA 146* 146* 147* 149*  K 4.7 4.1 4.1 2.8*  CL 108  --  114* 114*  CO2 24  --  22 23  GLUCOSE 146*  --  145* 94  BUN 49*  --  48* 48*  CREATININE 3.22*  --  2.72* 1.96*  CALCIUM 9.2  --  8.0* 8.5*  MG  --   --   --  1.7   GFR: Estimated Creatinine Clearance: 36.8 mL/min (A) (by C-G formula based on SCr of 1.96 mg/dL (H)). Liver Function Tests: Recent Labs  Lab 03/02/20 1155 03/02/20 1820  AST 28 25  ALT 26 21  ALKPHOS 102 81  BILITOT 1.5* 1.4*  PROT 7.6 5.7*  ALBUMIN 2.4* 1.9*   Recent Labs  Lab 03/02/20 1155  LIPASE 22   Recent Labs  Lab 03/02/20 1155  AMMONIA 18   Coagulation Profile: Recent Labs  Lab 03/02/20 1155  INR 1.8*   Cardiac Enzymes: Recent Labs  Lab 03/02/20 1155  CKTOTAL 117   BNP (last 3 results) No results for input(s): PROBNP in the last 8760 hours. HbA1C: Recent Labs    03/03/20 0918  HGBA1C 6.0*   CBG: Recent Labs  Lab 03/03/20 2309 03/04/20 0030 03/04/20 0156 03/04/20 0350 03/04/20 0756  GLUCAP 96 95 86 84 79   Lipid Profile: No results for input(s): CHOL, HDL, LDLCALC, TRIG, CHOLHDL, LDLDIRECT in the last 72 hours. Thyroid Function Tests: Recent Labs    03/03/20 0918  TSH 1.263   Anemia Panel: No results for input(s): VITAMINB12, FOLATE, FERRITIN, TIBC, IRON, RETICCTPCT in the last 72 hours. Sepsis  Labs: Recent Labs  Lab 03/02/20 1155 03/02/20 1639 03/04/20 0131  PROCALCITON  --   --  0.64  LATICACIDVEN 2.1* 1.7  --     Recent Results (from the past 240 hour(s))  Culture, blood (routine x 2)     Status: None (Preliminary result)   Collection Time: 03/02/20 11:43 AM   Specimen: BLOOD  Result Value Ref Range Status   Specimen Description BLOOD RIGHT ANTECUBITAL  Final   Special Requests   Final    BOTTLES DRAWN AEROBIC AND ANAEROBIC Blood Culture adequate volume   Culture   Final  NO GROWTH < 24 HOURS Performed at [facility], [address], [place], [place] [address]    Report Status PENDING  Incomplete  Urine culture     Status: None   Collection Time: 03/02/20 11:43 AM   Specimen: Urine, Random  Result Value Ref Range Status   Specimen Description URINE, RANDOM  Final   Special Requests NONE  Final   Culture   Final    NO GROWTH Performed at [facility], [address] [place], [place] [address]    Report Status 03/03/2020 FINAL  Final  SARS Coronavirus 2 by RT PCR (hospital order, performed in [facility] hospital lab) Nasopharyngeal Nasopharyngeal Swab     Status: Abnormal   Collection Time: 03/02/20 11:46 AM   Specimen: Nasopharyngeal Swab  Result Value Ref Range Status   SARS Coronavirus 2 POSITIVE (A) NEGATIVE Final    Comment: RESULT CALLED TO, READ BACK BY AND VERIFIED WITH: RN Colonel Bald [phone] AT 1405 BY CM (NOTE) SARS-CoV-2 target nucleic acids are DETECTED  SARS-CoV-2 RNA is generally detectable in upper respiratory specimens  during the acute phase of infection.  Positive results are indicative  of the presence of the identified virus, but do not rule out bacterial infection or co-infection with other pathogens not detected by the test.  Clinical correlation with patient history and  other diagnostic information is necessary to determine patient infection status.  The expected result is negative.  Fact Sheet for Patients:    BoilerBrush.com.cy   Fact Sheet for Healthcare Providers:   https://pope.com/    This test is not yet approved or cleared by the [place] FDA and  has been authorized for detection and/or diagnosis of SARS-CoV-2 by FDA under an Emergency Use Authorization (EUA).  This EUA will remain in effect (meaning this test c an be used) for the duration of  the COVID-19 declaration under Section 564(b)(1) of the Act, 21 U.S.C. section 360-bbb-3(b)(1), unless the authorization is terminated or revoked sooner.  Performed at [facility], [address] [place], [place] [address]   Culture, blood (routine x 2)     Status: None (Preliminary result)   Collection Time: 03/03/20  4:22 AM   Specimen: BLOOD  Result Value Ref Range Status   Specimen Description BLOOD SITE NOT SPECIFIED  Final   Special Requests   Final    BOTTLES DRAWN AEROBIC ONLY Blood Culture results may not be optimal due to an inadequate volume of blood received in culture bottles   Culture   Final    NO GROWTH < 12 HOURS Performed at [facility], [address], [place], [place] [address]    Report Status PENDING  Incomplete  MRSA PCR Screening     Status: Abnormal   Collection Time: 03/03/20  8:47 PM   Specimen: Nasopharyngeal  Result Value Ref Range Status   MRSA by PCR POSITIVE (A) NEGATIVE Final    Comment:        The GeneXpert MRSA Assay (FDA approved for NASAL specimens only), is one component of a comprehensive MRSA colonization surveillance program. It is not intended to diagnose MRSA infection nor to guide or monitor treatment for MRSA infections. RESULT CALLED TO, READ BACK BY AND VERIFIED WITH: [facility] RN 2249 03/03/2020 MITCHELL,L Performed at [facility], [address], [place], [place] [address]          Radiology Studies: CT Head Wo Contrast  Result Date: 03/02/2020  CLINICAL DATA:  "Head trauma, minor". EXAM: CT HEAD  WITHOUT CONTRAST TECHNIQUE: Contiguous axial images were obtained from the base of the skull through the vertex without intravenous contrast. COMPARISON:  02/20/2020. FINDINGS: Brain: Advanced cerebral and cerebellar atrophy. Moderate low density in the periventricular white matter likely related to small vessel disease. No mass lesion, hemorrhage, hydrocephalus, acute infarct, intra-axial, or extra-axial fluid collection. Vascular: Intracranial atherosclerosis. Skull: No scalp soft tissue swelling.  No skull fracture. Sinuses/Orbits: Evaluated on dedicated face CT, dictated separately. Other: None. IMPRESSION: 1. No acute intracranial abnormality. 2. Cerebral atrophy and small vessel ischemic change. Electronically Signed   By: Jeronimo Greaves M.D.   On: 03/02/2020 14:53   CT Cervical Spine Wo Contrast  Result Date: 03/02/2020 CLINICAL DATA:  75 year old male with history of neck trauma from a fall on 02/28/2020. On Eliquis. Swelling in the left side of face. EXAM: CT MAXILLOFACIAL WITHOUT CONTRAST CT CERVICAL SPINE WITHOUT CONTRAST TECHNIQUE: Multidetector CT imaging of the maxillofacial structures was performed. Multiplanar CT image reconstructions were also generated. A small metallic BB was placed on the right temple in order to reliably differentiate right from left. Multidetector CT imaging of the cervical spine was performed without intravenous contrast. Multiplanar CT image reconstructions were also generated. COMPARISON:  Head CT 03/02/2020. FINDINGS: CT MAXILLOFACIAL FINDINGS Osseous: No acute displaced fracture. Left mandibular condyle is anteriorly dislocated (sagittal image 76 of series 11). No destructive process. Orbits: Negative. No traumatic or inflammatory finding. Sinuses: Clear. Soft tissues: A extensive soft tissue swelling in the subcutaneous fat of the left side of the face lateral to the left mandible. Limited intracranial: See separate report for recently obtained head CT dated 03/02/2020  for full description of intracranial findings. CT CERVICAL FINDINGS Alignment: Normal. Skull base and vertebrae: No acute fracture. No primary bone lesion or focal pathologic process. Soft tissues and spinal canal: No prevertebral fluid or swelling. No visible canal hematoma. Disc levels: Mild multilevel degenerative disc disease, most apparent at C5-C6. Mild multilevel facet arthropathy. Upper chest: Unremarkable. Other: None. IMPRESSION: 1. Anterior dislocation of the left mandibular condyle from the temporomandibular joint with extensive overlying soft tissue swelling. 2. No evidence of significant acute traumatic injury to the cervical spine. Electronically Signed   By: Trudie Reed M.D.   On: 03/02/2020 15:14   DG Chest Port 1 View  Result Date: 03/02/2020 CLINICAL DATA:  Dyspnea EXAM: PORTABLE CHEST 1 VIEW COMPARISON:  January 21, 2020 FINDINGS: The heart size and mediastinal contours are stable. The aorta is tortuous. Both lungs are clear. Chronic deformity of several lower right ribs are noted. IMPRESSION: No active disease. Electronically Signed   By: Sherian Rein M.D.   On: 03/02/2020 12:28   CT MAXILLOFACIAL WO CONTRAST  Result Date: 03/02/2020 CLINICAL DATA:  75 year old male with history of neck trauma from a fall on 02/28/2020. On Eliquis. Swelling in the left side of face. EXAM: CT MAXILLOFACIAL WITHOUT CONTRAST CT CERVICAL SPINE WITHOUT CONTRAST TECHNIQUE: Multidetector CT imaging of the maxillofacial structures was performed. Multiplanar CT image reconstructions were also generated. A small metallic BB was placed on the right temple in order to reliably differentiate right from left. Multidetector CT imaging of the cervical spine was performed without intravenous contrast. Multiplanar CT image reconstructions were also generated. COMPARISON:  Head CT 03/02/2020. FINDINGS: CT MAXILLOFACIAL FINDINGS Osseous: No acute displaced fracture. Left mandibular condyle is anteriorly dislocated  (sagittal image 76 of series 11). No destructive process. Orbits: Negative. No traumatic or inflammatory finding. Sinuses:  Clear. Soft tissues: A extensive soft tissue swelling in the subcutaneous fat of the left side of the face lateral to the left mandible. Limited intracranial: See separate report for recently obtained head CT dated 03/02/2020 for full description of intracranial findings. CT CERVICAL FINDINGS Alignment: Normal. Skull base and vertebrae: No acute fracture. No primary bone lesion or focal pathologic process. Soft tissues and spinal canal: No prevertebral fluid or swelling. No visible canal hematoma. Disc levels: Mild multilevel degenerative disc disease, most apparent at C5-C6. Mild multilevel facet arthropathy. Upper chest: Unremarkable. Other: None. IMPRESSION: 1. Anterior dislocation of the left mandibular condyle from the temporomandibular joint with extensive overlying soft tissue swelling. 2. No evidence of significant acute traumatic injury to the cervical spine. Electronically Signed   By: Trudie Reed M.D.   On: 03/02/2020 15:14        Scheduled Meds: . budesonide  1 puff Inhalation BID  . Chlorhexidine Gluconate Cloth  6 each Topical Daily  . enoxaparin (LOVENOX) injection  1 mg/kg Subcutaneous Q24H  . insulin aspart  0-15 Units Subcutaneous TID WC  . insulin aspart  0-5 Units Subcutaneous QHS  . Ipratropium-Albuterol  1 puff Inhalation QID  . mupirocin ointment  1 application Nasal BID   Continuous Infusions: . ampicillin-sulbactam (UNASYN) IV 3 g (03/04/20 0653)  . dextrose 5 % and 0.45% NaCl       LOS: 2 days   Time spent= 35 mins    Loys Hoselton Joline Maxcy, MD [facility]  If 7PM-7AM, please contact night-coverage  03/04/2020, 8:30 AM

## 2020-03-05 DIAGNOSIS — A419 Sepsis, unspecified organism: Secondary | ICD-10-CM | POA: Diagnosis not present

## 2020-03-05 DIAGNOSIS — R6520 Severe sepsis without septic shock: Secondary | ICD-10-CM | POA: Diagnosis not present

## 2020-03-05 LAB — BASIC METABOLIC PANEL
Anion gap: 10 (ref 5–15)
BUN: 47 mg/dL — ABNORMAL HIGH (ref 8–23)
CO2: 24 mmol/L (ref 22–32)
Calcium: 8.8 mg/dL — ABNORMAL LOW (ref 8.9–10.3)
Chloride: 116 mmol/L — ABNORMAL HIGH (ref 98–111)
Creatinine, Ser: 1.68 mg/dL — ABNORMAL HIGH (ref 0.61–1.24)
GFR, Estimated: 42 mL/min — ABNORMAL LOW (ref >60)
Glucose, Bld: 119 mg/dL — ABNORMAL HIGH (ref 70–99)
Potassium: 3.1 mmol/L — ABNORMAL LOW (ref 3.5–5.1)
Sodium: 150 mmol/L — ABNORMAL HIGH (ref 135–145)

## 2020-03-05 LAB — CBC
HCT: 34 % — ABNORMAL LOW (ref 39.0–52.0)
Hemoglobin: 10.6 g/dL — ABNORMAL LOW (ref 13.0–17.0)
MCH: 30.9 pg (ref 26.0–34.0)
MCHC: 31.2 g/dL (ref 30.0–36.0)
MCV: 99.1 fL (ref 80.0–100.0)
Platelets: 119 10*3/uL — ABNORMAL LOW (ref 150–400)
RBC: 3.43 MIL/uL — ABNORMAL LOW (ref 4.22–5.81)
RDW: 17.2 % — ABNORMAL HIGH (ref 11.5–15.5)
WBC: 10.1 10*3/uL (ref 4.0–10.5)
nRBC: 0 % (ref 0.0–0.2)

## 2020-03-05 LAB — GLUCOSE, CAPILLARY
Glucose-Capillary: 86 mg/dL (ref 70–99)
Glucose-Capillary: 120 mg/dL — ABNORMAL HIGH (ref 70–99)
Glucose-Capillary: 116 mg/dL — ABNORMAL HIGH (ref 70–99)
Glucose-Capillary: 95 mg/dL (ref 70–99)

## 2020-03-05 LAB — MAGNESIUM: Magnesium: 2.1 mg/dL (ref 1.7–2.4)

## 2020-03-05 MED ORDER — POTASSIUM CHLORIDE 10 MEQ/100ML IV SOLN: 10.0000 meq | INTRAVENOUS | Status: DC

## 2020-03-05 MED ORDER — POTASSIUM CHLORIDE 20 MEQ PO PACK
40.0000 meq | PACK | Freq: Once | ORAL | Status: AC
  Administered 2020-03-05: 40 meq via ORAL
  Filled 2020-03-05: qty 2

## 2020-03-05 MED ORDER — DEXTROSE-NACL 5-0.45 % IV SOLN: INTRAVENOUS | Status: AC

## 2020-03-05 MED ORDER — POTASSIUM CHLORIDE 10 MEQ/100ML IV SOLN
10.0000 meq | INTRAVENOUS | Status: DC
  Administered 2020-03-05 (×2): 10 meq via INTRAVENOUS
  Filled 2020-03-05: qty 100

## 2020-03-05 MED ORDER — HALOPERIDOL LACTATE 5 MG/ML IJ SOLN
2.0000 mg | Freq: Three times a day (TID) | INTRAMUSCULAR | Status: AC | PRN
  Administered 2020-03-06: 2 mg via INTRAVENOUS
  Filled 2020-03-05: qty 1

## 2020-03-05 MED ORDER — POTASSIUM CHLORIDE CRYS ER 20 MEQ PO TBCR
40.0000 meq | EXTENDED_RELEASE_TABLET | Freq: Once | ORAL | Status: AC
  Administered 2020-03-05: 40 meq via ORAL
  Filled 2020-03-05: qty 2

## 2020-03-05 NOTE — TOC CAGE-AID Note (Signed)
Transition of Care [facility]) - CAGE-AID Screening   Patient Details  Name: Chad Duke MRN: [id number] Date of Birth: [birth year]  Transition of Care [facility]) CM/SW Contact:    Glennon Mac, RN Phone Number: 03/05/2020, 4:09 PM   Clinical Narrative: Pt adm on 03/02/20 with confusion and progressively poor intake.  He remains confused and unable to participate in SA assessment.    CAGE-AID Screening: Substance Abuse Screening unable to be completed due to: : Patient unable to participate (encephalopathy, confusion)                   Quintella Baton, RN, BSN  Trauma/Neuro ICU Case Manager [phone]

## 2020-03-05 NOTE — Progress Notes (Signed)
Physical Therapy Treatment Patient Details Name: Chad Duke MRN: [id number] DOB: 05-Oct-1945 Today's Date: 03/05/2020    History of Present Illness 75 y.o. male who lives at [facility] care and has a h/o chronic diastolic CHF, AAA, HTN, HLD, PE, rheumatoid arthritis, hypogonadism, depression, nephrolithiasis, COPD on 2 L O2 and OSA. In ED 02/20/20 for 2x fall and AMS. Sent from SNF [facility]) 03/02/20 for confusion and progressively poor oral intake. In ED found to have a left facial swelling, is febrile and tachycardic. Niece mentioned that pt was diagnosed COVID+ 2 weeks ago at facility. Released from COVID isolation at his facility a week ago. Admitted for treatment of severe sepsis secondary to L parotitis and acute metabolic encephalopathy.    PT Comments    Pt slightly easier to direct today. Found to be incontinent of stool on entry. Therapy was able to remove restraints to facilitate rolling for pericare. Pt requires maxAx2 rolling to clean, and for coming to seated at the EoB. Pt unable to maintain independent balance at EoB. Able to stand and take small steps towards HoB with maxAx2 before needing to sit back down. With attempt to return mittens to hand, pt noted to have skin tears associated with the restraints. Informed charge RN and recommended sitter instead of restraints as he is able to be redirected today. D/c plans remain appropriate at this time. PT will continue to follow acutely.     Follow Up Recommendations  SNF     Equipment Recommendations  None recommended by PT    Recommendations for Other Services OT consult     Precautions / Restrictions Precautions Precautions: Fall Precaution Comments: 2 documented falls during ED visit 02/20/20 Restrictions Weight Bearing Restrictions: No    Mobility  Bed Mobility Overal bed mobility: Needs Assistance Bed Mobility: Rolling;Supine to Sit;Sit to Supine Rolling: Max assist;+2 for physical assistance;+2 for  safety/equipment   Supine to sit: Max assist;+2 for physical assistance;+2 for safety/equipment Sit to supine: Max assist;+2 for physical assistance;+2 for safety/equipment   General bed mobility comments: pt with some initiation to move towards EOB but requires two person assist to successfully complete given poor command follow and difficulty motor planning/sequencing movements  Transfers Overall transfer level: Needs assistance Equipment used: 2 person hand held assist Transfers: Sit to/from Stand Sit to Stand: Max assist;+2 physical assistance;+2 safety/equipment         General transfer comment: tactile/verbal cues to initiate movements with assist to rise and stabilize, completing via +2 face to face, pt able to maintain standing (+2 maxA)while NT changing out bed pad         Balance Overall balance assessment: Needs assistance;History of Falls   Sitting balance-Leahy Scale: Poor Sitting balance - Comments: overall reliant on external assist to maintain static balance   Standing balance support: Bilateral upper extremity supported Standing balance-Leahy Scale: Zero                              Cognition Arousal/Alertness: Awake/alert Behavior During Therapy: Impulsive;Restless Overall Cognitive Status: History of cognitive impairments - at baseline Area of Impairment: Orientation;Memory;Following commands;Attention;Safety/judgement;Awareness;Problem solving                 Orientation Level: Disoriented to;Place;Time;Situation Current Attention Level: Focused Memory: Decreased short-term memory Following Commands: Follows one step commands inconsistently Safety/Judgement: Decreased awareness of safety;Decreased awareness of deficits Awareness: Intellectual Problem Solving: Requires verbal cues;Requires tactile cues;Difficulty sequencing;Decreased initiation General Comments: pt  intermittently with clear/appropriate statements but often with  tangential/nonsensical speech         General Comments General comments (skin integrity, edema, etc.): VSS, pt with notable skin tears around wrists where mittens and restraints in place, notified charge RN and requested sitter for pt instead of restraints as he is generally redirectable today      Pertinent Vitals/Pain Pain Assessment: Faces Faces Pain Scale: Hurts a little bit Pain Location: generalized with movement Pain Descriptors / Indicators: Grimacing;Guarding Pain Intervention(s): Monitored during session;Repositioned;Limited activity within patient's tolerance    Home Living Family/patient expects to be discharged to:: Skilled nursing facility                    Prior Function        Comments: pt unable to state   PT Goals (current goals can now be found in the care plan section) Acute Rehab PT Goals Patient Stated Goal: unable to state PT Goal Formulation: Patient unable to participate in goal setting Time For Goal Achievement: 03/18/20 Potential to Achieve Goals: Poor Progress towards PT goals: Progressing toward goals    Frequency    Min 2X/week      PT Plan      Co-evaluation PT/OT/SLP Co-Evaluation/Treatment: Yes Reason for Co-Treatment: For patient/therapist safety PT goals addressed during session: Mobility/safety with mobility OT goals addressed during session: ADL's and self-care      AM-PAC PT "6 Clicks" Mobility   Outcome Measure  Help needed turning from your back to your side while in a flat bed without using bedrails?: Total Help needed moving from lying on your back to sitting on the side of a flat bed without using bedrails?: Total Help needed moving to and from a bed to a chair (including a wheelchair)?: Total Help needed standing up from a chair using your arms (e.g., wheelchair or bedside chair)?: Total Help needed to walk in hospital room?: Total Help needed climbing 3-5 steps with a railing? : Total 6 Click Score: 6     End of Session Equipment Utilized During Treatment: Oxygen Activity Tolerance: Patient tolerated treatment well Patient left: in bed;with call bell/phone within reach;with bed alarm set;with nursing/sitter in room   PT Visit Diagnosis: Other abnormalities of gait and mobility (R26.89);Muscle weakness (generalized) (M62.81);Difficulty in walking, not elsewhere classified (R26.2);Repeated falls (R29.6);History of falling (Z91.81);Unsteadiness on feet (R26.81);Adult, failure to thrive (R62.7)     Time: 7353-2992 PT Time Calculation (min) (ACUTE ONLY): 47 min  Charges:  $Therapeutic Activity: 8-22 mins                     Tziporah Knoke B. Beverely Risen PT, DPT Acute Rehabilitation Services Pager [phone] Office [phone]    [name] Fleet 03/05/2020, 4:18 PM

## 2020-03-05 NOTE — TOC CAGE-AID Note (Signed)
Transition of Care [facility]) - CAGE-AID Screening   Patient Details  Name: Chad Duke MRN: [id number] Date of Birth: [birth year]  Clinical Narrative: Patient unable to participate due to altered mental status.     CAGE-AID Screening: Substance Abuse Screening unable to be completed due to: : Patient unable to participate

## 2020-03-05 NOTE — Evaluation (Signed)
Occupational Therapy Evaluation Patient Details Name: Chad Duke MRN: [id number] DOB: April 19, 1945 Today's Date: 03/05/2020    History of Present Illness 75 y.o. male who lives at [facility] care and has a h/o chronic diastolic CHF, AAA, HTN, HLD, PE, rheumatoid arthritis, hypogonadism, depression, nephrolithiasis, COPD on 2 L O2 and OSA. In ED 02/20/20 for 2x fall and AMS. Sent from SNF [facility]) 03/02/20 for confusion and progressively poor oral intake. In ED found to have a left facial swelling, is febrile and tachycardic. Niece mentioned that pt was diagnosed COVID+ 2 weeks ago at facility. Released from COVID isolation at his facility a week ago. Admitted for treatment of severe sepsis secondary to L parotitis and acute metabolic encephalopathy.   Clinical Impression   This 75 y/o male presents with the above. PTA pt residing in SNF. He currently presents with notable confusion (but less agitated today compared to recently in chart), weakness, decreased balance and mobility status. Pt requiring two person assist for completion of bed mobility and sit<>stand transfers. Overall he requires totalA for ADL at this time. Pt pleasant but often with nonsensical/tangential speech, poor command follow. He will benefit from continued acute OT services and currently recommend continued acute OT services at SNF level after discharge. Will follow.     Follow Up Recommendations  SNF    Equipment Recommendations  Other (comment) (TBD)           Precautions / Restrictions Precautions Precautions: Fall Precaution Comments: 2 documented falls during ED visit 02/20/20 Restrictions Weight Bearing Restrictions: No      Mobility Bed Mobility Overal bed mobility: Needs Assistance Bed Mobility: Rolling;Supine to Sit;Sit to Supine Rolling: Max assist;+2 for physical assistance;+2 for safety/equipment   Supine to sit: Max assist;+2 for physical assistance;+2 for safety/equipment Sit to  supine: Max assist;+2 for physical assistance;+2 for safety/equipment   General bed mobility comments: pt with some initiation to move towards EOB but requires two person assist to successfully complete given poor command follow and difficulty motor planning/sequencing movements    Transfers Overall transfer level: Needs assistance Equipment used: 2 person hand held assist Transfers: Sit to/from Stand Sit to Stand: Max assist;+2 physical assistance;+2 safety/equipment         General transfer comment: tactile/verbal cues to initiate movements with assist to rise and stabilize, completing via +2 face to face, pt able to maintain standing (+2 maxA)while NT changing out bed pad    Balance Overall balance assessment: Needs assistance;History of Falls   Sitting balance-Leahy Scale: Poor Sitting balance - Comments: overall reliant on external assist to maintain static balance   Standing balance support: Bilateral upper extremity supported Standing balance-Leahy Scale: Zero                             ADL either performed or assessed with clinical judgement   ADL Overall ADL's : Needs assistance/impaired                                       General ADL Comments: pt overall requiring totalA for ADL at this time, today moreso due to cognitive deficits and difficulty with following commands                         Pertinent Vitals/Pain Pain Assessment: Faces Faces Pain Scale: Hurts a little bit  Pain Location: generalized with movement Pain Descriptors / Indicators: Grimacing;Guarding Pain Intervention(s): Monitored during session;Repositioned     Hand Dominance     Extremity/Trunk Assessment Upper Extremity Assessment Upper Extremity Assessment: Generalized weakness;Difficult to assess due to impaired cognition   Lower Extremity Assessment Lower Extremity Assessment: Defer to PT evaluation       Communication  Communication Communication: Expressive difficulties;Other (comment) (vasilates between comprehendable speech and nonsense speech.)   Cognition Arousal/Alertness: Awake/alert Behavior During Therapy: Impulsive;Restless Overall Cognitive Status: History of cognitive impairments - at baseline Area of Impairment: Orientation;Memory;Following commands;Attention;Safety/judgement;Awareness;Problem solving                 Orientation Level: Disoriented to;Place;Time;Situation Current Attention Level: Focused Memory: Decreased short-term memory Following Commands: Follows one step commands inconsistently Safety/Judgement: Decreased awareness of safety;Decreased awareness of deficits Awareness: Intellectual Problem Solving: Requires verbal cues;Requires tactile cues;Difficulty sequencing;Decreased initiation General Comments: pt intermittently with clear/appropriate statements but often with tangential/nonsensical speech   General Comments  VSS, pt with notable skin tears on bil wrists (suspect due to restraints) with charge nurse made aware and in to assess end of session    Exercises     Shoulder Instructions      Home Living Family/patient expects to be discharged to:: Skilled nursing facility                                        Prior Functioning/Environment          Comments: pt unable to state        OT Problem List: Decreased strength;Decreased range of motion;Decreased activity tolerance;Decreased cognition;Decreased safety awareness;Impaired balance (sitting and/or standing);Decreased knowledge of use of DME or AE;Decreased knowledge of precautions      OT Treatment/Interventions: Therapeutic exercise;Self-care/ADL training;Energy conservation;DME and/or AE instruction;Therapeutic activities;Cognitive remediation/compensation;Patient/family education;Balance training    OT Goals(Current goals can be found in the care plan section) Acute Rehab OT  Goals Patient Stated Goal: unable to state OT Goal Formulation: Patient unable to participate in goal setting Time For Goal Achievement: 03/20/20 Potential to Achieve Goals: Fair  OT Frequency: Min 2X/week   Barriers to D/C:            Co-evaluation PT/OT/SLP Co-Evaluation/Treatment: Yes Reason for Co-Treatment: For patient/therapist safety;Necessary to address cognition/behavior during functional activity;To address functional/ADL transfers   OT goals addressed during session: ADL's and self-care      AM-PAC OT "6 Clicks" Daily Activity     Outcome Measure Help from another person eating meals?: Total Help from another person taking care of personal grooming?: Total Help from another person toileting, which includes using toliet, bedpan, or urinal?: Total Help from another person bathing (including washing, rinsing, drying)?: Total Help from another person to put on and taking off regular upper body clothing?: Total Help from another person to put on and taking off regular lower body clothing?: Total 6 Click Score: 6   End of Session Nurse Communication: Mobility status  Activity Tolerance: Patient tolerated treatment well Patient left: in bed;with call bell/phone within reach;with bed alarm set;with nursing/sitter in room (NT in room to act as sitter initially)  OT Visit Diagnosis: Other abnormalities of gait and mobility (R26.89);Muscle weakness (generalized) (M62.81);Other symptoms and signs involving cognitive function                Time: 7672-0947 OT Time Calculation (min): 47 min Charges:  OT General Charges $OT  Visit: 1 Visit OT Evaluation $OT Eval Moderate Complexity: 1 Mod OT Treatments $Self Care/Home Management : 8-22 mins  Marcy Siren, OT Acute Rehabilitation Services Pager [phone] Office [phone]   Orlando Penner 03/05/2020, 2:52 PM

## 2020-03-05 NOTE — Progress Notes (Signed)
Pt unable to wear cpap due to mitts 

## 2020-03-05 NOTE — Progress Notes (Signed)
PROGRESS NOTE    Chad Duke  NTZ:[id number] DOB: 21-May-1945 DOA: 03/02/2020 PCP: Soundra Pilon, FNP   Brief Narrative:  75 year old residing at [facility] health care, chronic diastolic CHF, HTN, AAA, PE, rheumatoid arthritis, hypogonadism, depression, COPD on chronic 2 L nasal cannula, OSA, HLD was sent to the hospital for poor oral intake and left-sided facial swelling concerning for parotitis.  Patient was diagnosed of COVID 2 weeks ago and treated at the facility.  Seen by ENT in the ER started on Decadron; warm compresses and Unasyn.  Seen by speech therapist, started on dysphagia one diet.  PT is recommending SNF, TOC team aware.   Assessment & Plan:   Principal Problem:   Severe sepsis (HCC) Active Problems:   COPD GOLD III    Sleep apnea   Rheumatoid arthritis (HCC)   Morbid obesity due to excess calories (HCC)   History of pulmonary embolism   Acute renal failure superimposed on stage 3 chronic kidney disease (HCC)   Controlled type 2 diabetes mellitus with hyperglycemia (HCC)   Parotitis  Severe sepsis secondary to left parotitis - Sepsis physiology is improving.  Warm compresses.  Aggressive massage - ENT-continue Unasyn, Decadron and warm compresses. - Pain control -CT cervical spine-anterior dislocation of left mandibular condyle with soft tissue swelling  Acute metabolic encephalopathy - CT head negative for acute changes - Ammonia-18, UA-negative  Acute kidney injury on CKD stage IIIa, prerenal - Baseline creatinine 1.8.  Admission creatinine 3.22.  Creatinine this morning 1.96 - Lasix on hold  Dysphagia likely from peritonitis -Patient seen by speech yesterday.  Doing slightly better.  Currently on dysphagia one diet.  Continue Accu-Cheks.  Hypokalemia with hypernatremia -Continue D5 half-normal saline and aggressive potassium repletion.  Continue Accu-Cheks for now.  Recent COVID-19 infection, asymptomatic -Chest x-ray negative for any active  infiltrates.  Currently on 2 L nasal cannula  Chronic hypoxia secondary to COPD Abnormal breath sounds with bilateral rhonchi - Home uses 2-3 L nasal cannula. - Continue bronchodilators, already on steroids  History of pulmonary embolism - On home Eliquis "currently on Lovenox due to his mentation  Diabetes mellitus type 2 - Glucophage on hold -Accu-Cheks and sliding scale  History of gout - Continue allopurinol  DVT prophylaxis: Lovenox Code Status: DNR Family Communication:  Called Hedi 1/25, no answer. Left a voicemail.   Status is: Inpatient  Remains inpatient appropriate because:IV treatments appropriate due to intensity of illness or inability to take PO   Dispo: The patient is from: Home              Anticipated d/c is to: SNF              Anticipated d/c date is: 2 days              Patient currently is not medically stable to d/c.  Maintain hospital stay until his p.o. intake is not consistent, he requires IV antibiotics.  In the meantime awaiting his mental status to improve   Difficult to place patient No   Body mass index is 32.58 kg/m.    Subjective: Patient is 70 complaints this morning.  He is a bit more interactive.  When I asked him his name is able to tell me his full name but when I asked him where he is he started telling me " he is where I am" and when asked him what today's date is he tells me " he is in the same time as me"  His oral intake still remains poor  Review of Systems Otherwise negative except as per HPI, including: Difficult to fully assess but denies any complaints overall  Examination: Constitutional: Not in acute distress, left-sided facial swelling Respiratory: Clear to auscultation bilaterally Cardiovascular: Normal sinus rhythm, no rubs Abdomen: Nontender nondistended good bowel sounds Musculoskeletal: No edema noted Skin: No rashes seen Neurologic: Exam is largely nonfocal.  Alert to his name only. Psychiatric: Poor  judgment and insight  Objective: Vitals:   03/05/20 0000 03/05/20 0347 03/05/20 0723 03/05/20 0744  BP: (!) 131/94 103/86  (!) 143/80  Pulse: 91 90  91  Resp: Temp: (!) 97.5 F (36.4 C) 97.8 F (36.6 C)  97.9 F (36.6 C)  TempSrc: Axillary Axillary  Axillary  SpO2: 99% 90% 98% 90%  Weight:      Height:        Intake/Output Summary (Last 24 hours) at 03/05/2020 1103 Last data filed at 03/05/2020 0900 Gross per 24 hour  Intake 910.94 ml  Output 1100 ml  Net -189.06 ml   Filed Weights   03/02/20 1146 03/04/20 0318  Weight: 99.8 kg 97.2 kg     Data Reviewed:   CBC: Recent Labs  Lab 03/02/20 1155 03/02/20 1355 03/03/20 0422 03/04/20 0131 03/05/20 0037  WBC 24.0*  --  18.5* 13.9* 10.1  NEUTROABS 18.3*  --   --   --   --   HGB 14.3 12.6* 11.3* 10.3* 10.6*  HCT 44.8 37.0* 36.5* 33.3* 34.0*  MCV 97.6  --  99.5 99.7 99.1  PLT 191  --  131* 125* 119*   Basic Metabolic Panel: Recent Labs  Lab 03/02/20 1155 03/02/20 1355 03/02/20 1820 03/04/20 0131 03/04/20 1228 03/05/20 0037  NA 146* 146* 147* 149* 151* 150*  K 4.7 4.1 4.1 2.8* 3.1* 3.1*  CL 108  --  114* 114* 116* 116*  CO2 24  --  GLUCOSE 146*  --  145* 94 99 119*  BUN 49*  --  48* 48* 47* 47*  CREATININE 3.22*  --  2.72* 1.96* 1.72* 1.68*  CALCIUM 9.2  --  8.0* 8.5* 8.7* 8.8*  MG  --   --   --  1.7  --  2.1   GFR: Estimated Creatinine Clearance: 42.9 mL/min (A) (by C-G formula based on SCr of 1.68 mg/dL (H)). Liver Function Tests: Recent Labs  Lab 03/02/20 1155 03/02/20 1820  AST 28 25  ALT 26 21  ALKPHOS 102 81  BILITOT 1.5* 1.4*  PROT 7.6 5.7*  ALBUMIN 2.4* 1.9*   Recent Labs  Lab 03/02/20 1155  LIPASE 22   Recent Labs  Lab 03/02/20 1155  AMMONIA 18   Coagulation Profile: Recent Labs  Lab 03/02/20 1155  INR 1.8*   Cardiac Enzymes: Recent Labs  Lab 03/02/20 1155  CKTOTAL 117   BNP (last 3 results) No results for input(s): PROBNP in the last 8760  hours. HbA1C: Recent Labs    03/03/20 0918  HGBA1C 6.0*   CBG: Recent Labs  Lab 03/04/20 1006 03/04/20 1220 03/04/20 1721 03/04/20 2141 03/05/20 0749  GLUCAP 92 111* 147* 95 86   Lipid Profile: No results for input(s): CHOL, HDL, LDLCALC, TRIG, CHOLHDL, LDLDIRECT in the last 72 hours. Thyroid Function Tests: Recent Labs    03/03/20 0918  TSH 1.263   Anemia Panel: No results for input(s): VITAMINB12, FOLATE, FERRITIN, TIBC, IRON, RETICCTPCT in the last 72 hours. Sepsis Labs: Recent  Labs  Lab 03/02/20 1155 03/02/20 1639 03/04/20 0131  PROCALCITON  --   --  0.64  LATICACIDVEN 2.1* 1.7  --     Recent Results (from the past 240 hour(s))  Culture, blood (routine x 2)     Status: None (Preliminary result)   Collection Time: 03/02/20 11:43 AM   Specimen: BLOOD  Result Value Ref Range Status   Specimen Description BLOOD RIGHT ANTECUBITAL  Final   Special Requests   Final    BOTTLES DRAWN AEROBIC AND ANAEROBIC Blood Culture adequate volume   Culture   Final    NO GROWTH 2 DAYS Performed at [facility] Lab, [address], [place], [place] [address]    Report Status PENDING  Incomplete  Urine culture     Status: None   Collection Time: 03/02/20 11:43 AM   Specimen: Urine, Random  Result Value Ref Range Status   Specimen Description URINE, RANDOM  Final   Special Requests NONE  Final   Culture   Final    NO GROWTH Performed at [facility] Lab, [address] [place], [place] [address]    Report Status 03/03/2020 FINAL  Final  SARS Coronavirus 2 by RT PCR (hospital order, performed in [facility] hospital lab) Nasopharyngeal Nasopharyngeal Swab     Status: Abnormal   Collection Time: 03/02/20 11:46 AM   Specimen: Nasopharyngeal Swab  Result Value Ref Range Status   SARS Coronavirus 2 POSITIVE (A) NEGATIVE Final    Comment: RESULT CALLED TO, READ BACK BY AND VERIFIED WITH: RN Colonel Bald [phone] AT 1405 BY CM (NOTE) SARS-CoV-2 target nucleic acids are  DETECTED  SARS-CoV-2 RNA is generally detectable in upper respiratory specimens  during the acute phase of infection.  Positive results are indicative  of the presence of the identified virus, but do not rule out bacterial infection or co-infection with other pathogens not detected by the test.  Clinical correlation with patient history and  other diagnostic information is necessary to determine patient infection status.  The expected result is negative.  Fact Sheet for Patients:   BoilerBrush.com.cy   Fact Sheet for Healthcare Providers:   https://pope.com/    This test is not yet approved or cleared by the [place] FDA and  has been authorized for detection and/or diagnosis of SARS-CoV-2 by FDA under an Emergency Use Authorization (EUA).  This EUA will remain in effect (meaning this test c an be used) for the duration of  the COVID-19 declaration under Section 564(b)(1) of the Act, 21 U.S.C. section 360-bbb-3(b)(1), unless the authorization is terminated or revoked sooner.  Performed at [facility] Lab, [address] [place], [place] [address]   Culture, blood (routine x 2)     Status: None (Preliminary result)   Collection Time: 03/03/20  4:22 AM   Specimen: BLOOD  Result Value Ref Range Status   Specimen Description BLOOD SITE NOT SPECIFIED  Final   Special Requests   Final    BOTTLES DRAWN AEROBIC ONLY Blood Culture results may not be optimal due to an inadequate volume of blood received in culture bottles   Culture   Final    NO GROWTH 1 DAY Performed at [facility] Lab, [address] [place], [place] [address]    Report Status PENDING  Incomplete  MRSA PCR Screening     Status: Abnormal   Collection Time: 03/03/20  8:47 PM   Specimen: Nasopharyngeal  Result Value Ref Range Status  MRSA by PCR POSITIVE (A) NEGATIVE Final    Comment:        The GeneXpert MRSA Assay (FDA approved for NASAL  specimens only), is one component of a comprehensive MRSA colonization surveillance program. It is not intended to diagnose MRSA infection nor to guide or monitor treatment for MRSA infections. RESULT CALLED TO, READ BACK BY AND VERIFIED WITH: [facility] RN 2249 03/03/2020 MITCHELL,L Performed at [facility] Lab, [address], [place], [place] [address]          Radiology Studies: No results found.      Scheduled Meds: . budesonide  1 puff Inhalation BID  . Chlorhexidine Gluconate Cloth  6 each Topical Daily  . enoxaparin (LOVENOX) injection  1 mg/kg Subcutaneous Q12H  . feeding supplement  237 mL Oral BID BM  . insulin aspart  0-15 Units Subcutaneous TID WC  . insulin aspart  0-5 Units Subcutaneous QHS  . Ipratropium-Albuterol  1 puff Inhalation QID  . mupirocin ointment  1 application Nasal BID   Continuous Infusions: . ampicillin-sulbactam (UNASYN) IV 3 g (03/05/20 0721)  . dextrose 5 % and 0.45% NaCl 75 mL/hr at 03/05/20 0900  . potassium chloride       LOS: 3 days   Time spent= 35 mins    Kayleana Waites Joline Maxcy, MD [facility]  If 7PM-7AM, please contact night-coverage  03/05/2020, 11:03 AM

## 2020-03-05 NOTE — Progress Notes (Addendum)
  Speech Language Pathology Treatment: Dysphagia  Patient Details Name: Chad Duke MRN: [id number] DOB: [birth year] Today's Date: 03/05/2020 Time: 1530-1540 SLP Time Calculation (min) (ACUTE ONLY): 10 min  Assessment / Plan / Recommendation Clinical Impression  Pt was seen for dysphagia treatment with NT in the room. NT reported that the pt intermittently held boluses, but she denied observance of any overt s/sx of aspiration. Mastication was moderately prolonged with regular texture solids and liquid washes were noted to clear mild to moderate oral residue. Pt tolerated purees, dysphagia 2 solids, and consecutive swallows of thin liquids without overt s/sx of aspiration. Mastication was [facility] and no significant oral residue was noted. Pt denied pain with mastication/oral prep of any consistencies. It is recommended that his diet be advanced to dysphagia 2 solids and SLP will continue to follow pt.    HPI HPI: Chad Duke is a 75 y/o M with recent history of Covid 19-infection, with AMS, dehydration and left acute parotitis, poor oral intake. Per ENT- No acute intervention of anteriorly subluxed left mandible. Difficult to determine if this is secondary to severe edema or from recent dislocation. ENT requested heated compress, massage and oral hydration when appropriate.      SLP Plan  Continue with current plan of care       Recommendations  Diet recommendations: Dysphagia 2 (fine chop);Thin liquid Liquids provided via: Cup;Straw Medication Administration: Whole meds with puree Supervision: Staff to assist with self feeding Compensations: Slow rate;Small sips/bites                Oral Care Recommendations: Oral care BID Follow up Recommendations: Skilled Nursing facility;24 hour supervision/assistance SLP Visit Diagnosis: Dysphagia, unspecified (R13.10) Plan: Continue with current plan of care       Riyanshi Wahab I. Vear Clock, MS, CCC-SLP Acute Rehabilitation Services Office  number [phone] Pager [phone]                [name] 03/05/2020, 5:26 PM

## 2020-03-06 ENCOUNTER — Inpatient Hospital Stay (HOSPITAL_COMMUNITY): Payer: Medicare Other

## 2020-03-06 DIAGNOSIS — N179 Acute kidney failure, unspecified: Secondary | ICD-10-CM | POA: Diagnosis not present

## 2020-03-06 LAB — BASIC METABOLIC PANEL
Anion gap: 9 (ref 5–15)
BUN: 34 mg/dL — ABNORMAL HIGH (ref 8–23)
CO2: 24 mmol/L (ref 22–32)
Calcium: 8.9 mg/dL (ref 8.9–10.3)
Chloride: 117 mmol/L — ABNORMAL HIGH (ref 98–111)
Creatinine, Ser: 1.28 mg/dL — ABNORMAL HIGH (ref 0.61–1.24)
GFR, Estimated: 58 mL/min — ABNORMAL LOW (ref >60)
Glucose, Bld: 66 mg/dL — ABNORMAL LOW (ref 70–99)
Potassium: 3.7 mmol/L (ref 3.5–5.1)
Sodium: 150 mmol/L — ABNORMAL HIGH (ref 135–145)

## 2020-03-06 LAB — GLUCOSE, CAPILLARY
Glucose-Capillary: 81 mg/dL (ref 70–99)
Glucose-Capillary: 101 mg/dL — ABNORMAL HIGH (ref 70–99)
Glucose-Capillary: 93 mg/dL (ref 70–99)
Glucose-Capillary: 100 mg/dL — ABNORMAL HIGH (ref 70–99)

## 2020-03-06 LAB — CBC
HCT: 32.2 % — ABNORMAL LOW (ref 39.0–52.0)
Hemoglobin: 10.2 g/dL — ABNORMAL LOW (ref 13.0–17.0)
MCH: 31.4 pg (ref 26.0–34.0)
MCHC: 31.7 g/dL (ref 30.0–36.0)
MCV: 99.1 fL (ref 80.0–100.0)
Platelets: 108 10*3/uL — ABNORMAL LOW (ref 150–400)
RBC: 3.25 MIL/uL — ABNORMAL LOW (ref 4.22–5.81)
RDW: 17.2 % — ABNORMAL HIGH (ref 11.5–15.5)
WBC: 8.5 10*3/uL (ref 4.0–10.5)
nRBC: 0 % (ref 0.0–0.2)

## 2020-03-06 LAB — MAGNESIUM: Magnesium: 1.8 mg/dL (ref 1.7–2.4)

## 2020-03-06 MED ORDER — PANTOPRAZOLE SODIUM 40 MG PO TBEC
40.0000 mg | DELAYED_RELEASE_TABLET | Freq: Every day | ORAL | Status: DC
  Administered 2020-03-06 – 2020-03-15 (×10): 40 mg via ORAL
  Filled 2020-03-06 (×9): qty 1

## 2020-03-06 MED ORDER — IPRATROPIUM-ALBUTEROL 20-100 MCG/ACT IN AERS
1.0000 | INHALATION_SPRAY | Freq: Three times a day (TID) | RESPIRATORY_TRACT | Status: DC
  Administered 2020-03-06 – 2020-03-15 (×22): 1 via RESPIRATORY_TRACT
  Filled 2020-03-06: qty 4

## 2020-03-06 MED ORDER — INFLUENZA VAC A&B SA ADJ QUAD 0.5 ML IM PRSY
0.5000 mL | PREFILLED_SYRINGE | INTRAMUSCULAR | Status: DC
  Filled 2020-03-06: qty 0.5

## 2020-03-06 NOTE — Progress Notes (Signed)
PROGRESS NOTE    Chad Duke  WUJ:[id number] DOB: 1945/03/19 DOA: 03/02/2020 PCP: Soundra Pilon, FNP   Brief Narrative:  75 year old residing at [facility] health care, chronic diastolic CHF, HTN, AAA, PE, rheumatoid arthritis, hypogonadism, depression, COPD on chronic 2 L nasal cannula, OSA, HLD was sent to the hospital for poor oral intake and left-sided facial swelling concerning for parotitis.  Patient was diagnosed of COVID 2 weeks ago and treated at the facility.  Seen by ENT in the ER started on Decadron; warm compresses and Unasyn.  Seen by speech therapist, started on dysphagia one diet.  PT is recommending SNF, TOC team aware.  Subjective:  Patient denies any complaints this morning, he is significantly confused  His oral intake still remains poor  Assessment & Plan:   Principal Problem:   Severe sepsis (HCC) Active Problems:   COPD GOLD III    Sleep apnea   Rheumatoid arthritis (HCC)   Morbid obesity due to excess calories (HCC)   History of pulmonary embolism   Acute renal failure superimposed on stage 3 chronic kidney disease (HCC)   Controlled type 2 diabetes mellitus with hyperglycemia (HCC)   Parotitis  Severe sepsis secondary to left parotitis - Sepsis physiology is improving.  Warm compresses.  Aggressive massage - ENT-continue Unasyn, Decadron and warm compresses. - Pain control -CT cervical spine-anterior dislocation of left mandibular condyle with soft tissue swelling  Acute metabolic encephalopathy - CT head negative for acute changes - Ammonia-18, UA-negative -Most likely hospital delirium, but I will go ahead and obtain MRI of the brain -We will will correct  his hypernatremia as might be contributing.  Acute kidney injury on CKD stage IIIa, prerenal - Baseline creatinine 1.8.  Admission creatinine 3.22.  Creatinine this morning 1.28 - Lasix on hold  Dysphagia likely from peritonitis -Patient seen by speech yesterday.  Doing slightly better.   Currently on dysphagia one diet.  Continue Accu-Cheks.  Hypokalemia with hypernatremia -Continue D5 half-normal saline and aggressive potassium repletion.  Crease IV fluid 100 cc/h.  Continue Accu-Cheks for now.  Recent COVID-19 infection, asymptomatic -Chest x-ray negative for any active infiltrates.  Currently on 2 L nasal cannula  Chronic hypoxia secondary to COPD Abnormal breath sounds with bilateral rhonchi - Home uses 2-3 L nasal cannula. - Continue bronchodilators, already on steroids  History of pulmonary embolism - On home Eliquis "currently on Lovenox due to his mentation  Diabetes mellitus type 2 - Glucophage on hold -Accu-Cheks and sliding scale  History of gout - Continue allopurinol  We will discontinue Foley catheter today.  DVT prophylaxis: Lovenox Code Status: DNR Family Communication:  Called Hedi 1/26 no answer. Left a voicemail.   Status is: Inpatient  Remains inpatient appropriate because:IV treatments appropriate due to intensity of illness or inability to take PO   Dispo: The patient is from: Home              Anticipated d/c is to: SNF              Anticipated d/c date is: 2 days              Patient currently is not medically stable to d/c.  Maintain hospital stay until his p.o. intake is not consistent, he requires IV antibiotics.  In the meantime awaiting his mental status to improve   Difficult to place patient No   Body mass index is 32.58 kg/m.      Examination:  Patient is awake, confused, with left  facial swelling, exam is largely nonfocal, with poor judgment and insight . Symmetrical Chest wall movement, Good air movement bilaterally, CTAB RRR,No Gallops,Rubs or new Murmurs, No Parasternal Heave +ve B.Sounds, Abd Soft, No tenderness, No rebound - guarding or rigidity. No Cyanosis, Clubbing or edema, No new Rash or bruise     Objective: Vitals:   03/05/20 2354 03/06/20 0358 03/06/20 0745 03/06/20 1152  BP: 134/74 (!) 120/91  (!) 147/80 124/83  Pulse: 84 92 100 97  Resp: 15 20 17 15   Temp: 97.7 F (36.5 C) (!) 97.2 F (36.2 C) 97.6 F (36.4 C) 97.7 F (36.5 C)  TempSrc: Axillary Axillary Axillary Axillary  SpO2: 100% 100% 97% 100%  Weight:      Height:        Intake/Output Summary (Last 24 hours) at 03/06/2020 1439 Last data filed at 03/05/2020 1735 Gross per 24 hour  Intake 100 ml  Output 700 ml  Net -600 ml   Filed Weights   03/02/20 1146 03/04/20 0318  Weight: 99.8 kg 97.2 kg     Data Reviewed:   CBC: Recent Labs  Lab 03/02/20 1155 03/02/20 1355 03/03/20 0422 03/04/20 0131 03/05/20 0037 03/06/20 0107  WBC 24.0*  --  18.5* 13.9* 10.1 8.5  NEUTROABS 18.3*  --   --   --   --   --   HGB 14.3 12.6* 11.3* 10.3* 10.6* 10.2*  HCT 44.8 37.0* 36.5* 33.3* 34.0* 32.2*  MCV 97.6  --  99.5 99.7 99.1 99.1  PLT 191  --  131* 125* 119* 108*   Basic Metabolic Panel: Recent Labs  Lab 03/02/20 1820 03/04/20 0131 03/04/20 1228 03/05/20 0037 03/06/20 0107  NA 147* 149* 151* 150* 150*  K 4.1 2.8* 3.1* 3.1* 3.7  CL 114* 114* 116* 116* 117*  CO2 22 23 23 24 24   GLUCOSE 145* 94 99 119* 66*  BUN 48* 48* 47* 47* 34*  CREATININE 2.72* 1.96* 1.72* 1.68* 1.28*  CALCIUM 8.0* 8.5* 8.7* 8.8* 8.9  MG  --  1.7  --  2.1 1.8   GFR: Estimated Creatinine Clearance: 56.4 mL/min (A) (by C-G formula based on SCr of 1.28 mg/dL (H)). Liver Function Tests: Recent Labs  Lab 03/02/20 1155 03/02/20 1820  AST 28 25  ALT 26 21  ALKPHOS 102 81  BILITOT 1.5* 1.4*  PROT 7.6 5.7*  ALBUMIN 2.4* 1.9*   Recent Labs  Lab 03/02/20 1155  LIPASE 22   Recent Labs  Lab 03/02/20 1155  AMMONIA 18   Coagulation Profile: Recent Labs  Lab 03/02/20 1155  INR 1.8*   Cardiac Enzymes: Recent Labs  Lab 03/02/20 1155  CKTOTAL 117   BNP (last 3 results) No results for input(s): PROBNP in the last 8760 hours. HbA1C: No results for input(s): HGBA1C in the last 72 hours. CBG: Recent Labs  Lab 03/05/20 1202  03/05/20 1726 03/05/20 1934 03/06/20 0742 03/06/20 1154  GLUCAP 120* 116* 95 81 101*   Lipid Profile: No results for input(s): CHOL, HDL, LDLCALC, TRIG, CHOLHDL, LDLDIRECT in the last 72 hours. Thyroid Function Tests: No results for input(s): TSH, T4TOTAL, FREET4, T3FREE, THYROIDAB in the last 72 hours. Anemia Panel: No results for input(s): VITAMINB12, FOLATE, FERRITIN, TIBC, IRON, RETICCTPCT in the last 72 hours. Sepsis Labs: Recent Labs  Lab 03/02/20 1155 03/02/20 1639 03/04/20 0131  PROCALCITON  --   --  0.64  LATICACIDVEN 2.1* 1.7  --     Recent Results (from the past 240 hour(s))  Culture,  blood (routine x 2)     Status: None (Preliminary result)   Collection Time: 03/02/20 11:43 AM   Specimen: BLOOD  Result Value Ref Range Status   Specimen Description BLOOD RIGHT ANTECUBITAL  Final   Special Requests   Final    BOTTLES DRAWN AEROBIC AND ANAEROBIC Blood Culture adequate volume   Culture   Final    NO GROWTH 4 DAYS Performed at [facility] Lab, [address], [place], [place] [address]    Report Status PENDING  Incomplete  Urine culture     Status: None   Collection Time: 03/02/20 11:43 AM   Specimen: Urine, Random  Result Value Ref Range Status   Specimen Description URINE, RANDOM  Final   Special Requests NONE  Final   Culture   Final    NO GROWTH Performed at [facility] Lab, [address], [place], [place] [address]    Report Status 03/03/2020 FINAL  Final  SARS Coronavirus 2 by RT PCR (hospital order, performed in [facility] hospital lab) Nasopharyngeal Nasopharyngeal Swab     Status: Abnormal   Collection Time: 03/02/20 11:46 AM   Specimen: Nasopharyngeal Swab  Result Value Ref Range Status   SARS Coronavirus 2 POSITIVE (A) NEGATIVE Final    Comment: RESULT CALLED TO, READ BACK BY AND VERIFIED WITH: RN Colonel Bald [phone] AT 1405 BY CM (NOTE) SARS-CoV-2 target nucleic acids are DETECTED  SARS-CoV-2 RNA is generally detectable in upper  respiratory specimens  during the acute phase of infection.  Positive results are indicative  of the presence of the identified virus, but do not rule out bacterial infection or co-infection with other pathogens not detected by the test.  Clinical correlation with patient history and  other diagnostic information is necessary to determine patient infection status.  The expected result is negative.  Fact Sheet for Patients:   BoilerBrush.com.cy   Fact Sheet for Healthcare Providers:   https://pope.com/    This test is not yet approved or cleared by the [place] FDA and  has been authorized for detection and/or diagnosis of SARS-CoV-2 by FDA under an Emergency Use Authorization (EUA).  This EUA will remain in effect (meaning this test c an be used) for the duration of  the COVID-19 declaration under Section 564(b)(1) of the Act, 21 U.S.C. section 360-bbb-3(b)(1), unless the authorization is terminated or revoked sooner.  Performed at [facility] Lab, [address] [place], [place] [address]   Culture, blood (routine x 2)     Status: None (Preliminary result)   Collection Time: 03/03/20  4:22 AM   Specimen: BLOOD  Result Value Ref Range Status   Specimen Description BLOOD SITE NOT SPECIFIED  Final   Special Requests   Final    BOTTLES DRAWN AEROBIC ONLY Blood Culture results may not be optimal due to an inadequate volume of blood received in culture bottles   Culture   Final    NO GROWTH 3 DAYS Performed at [facility] Lab, [address] [place], [place] [address]    Report Status PENDING  Incomplete  MRSA PCR Screening     Status: Abnormal   Collection Time: 03/03/20  8:47 PM   Specimen: Nasopharyngeal  Result Value Ref Range Status   MRSA by PCR POSITIVE (A) NEGATIVE Final    Comment:        The GeneXpert MRSA Assay (FDA approved for NASAL specimens only), is one component of a comprehensive MRSA  colonization  surveillance program. It is not intended to diagnose MRSA infection nor to guide or monitor treatment for MRSA infections. RESULT CALLED TO, READ BACK BY AND VERIFIED WITH: [facility] RN 2249 03/03/2020 MITCHELL,L Performed at [facility] Lab, [address], [place], [place] [address]          Radiology Studies: No results found.      Scheduled Meds: . budesonide  1 puff Inhalation BID  . Chlorhexidine Gluconate Cloth  6 each Topical Daily  . enoxaparin (LOVENOX) injection  1 mg/kg Subcutaneous Q12H  . feeding supplement  237 mL Oral BID BM  . insulin aspart  0-15 Units Subcutaneous TID WC  . insulin aspart  0-5 Units Subcutaneous QHS  . Ipratropium-Albuterol  1 puff Inhalation TID  . mupirocin ointment  1 application Nasal BID   Continuous Infusions: . ampicillin-sulbactam (UNASYN) IV 3 g (03/06/20 0744)  . dextrose 5 % and 0.45% NaCl 75 mL/hr at 03/05/20 1345     LOS: 4 days     Huey Bienenstock, MD [facility]  If 7PM-7AM, please contact night-coverage  03/06/2020, 2:39 PM

## 2020-03-07 DIAGNOSIS — K1120 Sialoadenitis, unspecified: Secondary | ICD-10-CM

## 2020-03-07 DIAGNOSIS — A419 Sepsis, unspecified organism: Principal | ICD-10-CM

## 2020-03-07 DIAGNOSIS — R6520 Severe sepsis without septic shock: Secondary | ICD-10-CM | POA: Diagnosis not present

## 2020-03-07 LAB — BASIC METABOLIC PANEL
Anion gap: 9 (ref 5–15)
BUN: 23 mg/dL (ref 8–23)
CO2: 25 mmol/L (ref 22–32)
Calcium: 8.3 mg/dL — ABNORMAL LOW (ref 8.9–10.3)
Chloride: 113 mmol/L — ABNORMAL HIGH (ref 98–111)
Creatinine, Ser: 1.3 mg/dL — ABNORMAL HIGH (ref 0.61–1.24)
GFR, Estimated: 57 mL/min — ABNORMAL LOW (ref >60)
Glucose, Bld: 113 mg/dL — ABNORMAL HIGH (ref 70–99)
Potassium: 3.8 mmol/L (ref 3.5–5.1)
Sodium: 147 mmol/L — ABNORMAL HIGH (ref 135–145)

## 2020-03-07 LAB — CBC
HCT: 32.3 % — ABNORMAL LOW (ref 39.0–52.0)
Hemoglobin: 10.3 g/dL — ABNORMAL LOW (ref 13.0–17.0)
MCH: 30.8 pg (ref 26.0–34.0)
MCHC: 31.9 g/dL (ref 30.0–36.0)
MCV: 96.7 fL (ref 80.0–100.0)
Platelets: 93 10*3/uL — ABNORMAL LOW (ref 150–400)
RBC: 3.34 MIL/uL — ABNORMAL LOW (ref 4.22–5.81)
RDW: 17 % — ABNORMAL HIGH (ref 11.5–15.5)
WBC: 7.5 10*3/uL (ref 4.0–10.5)
nRBC: 0 % (ref 0.0–0.2)

## 2020-03-07 LAB — MAGNESIUM: Magnesium: 1.6 mg/dL — ABNORMAL LOW (ref 1.7–2.4)

## 2020-03-07 LAB — CULTURE, BLOOD (ROUTINE X 2)
Culture: NO GROWTH
Special Requests: ADEQUATE

## 2020-03-07 LAB — GLUCOSE, CAPILLARY
Glucose-Capillary: 106 mg/dL — ABNORMAL HIGH (ref 70–99)
Glucose-Capillary: 122 mg/dL — ABNORMAL HIGH (ref 70–99)
Glucose-Capillary: 108 mg/dL — ABNORMAL HIGH (ref 70–99)
Glucose-Capillary: 183 mg/dL — ABNORMAL HIGH (ref 70–99)

## 2020-03-07 MED ORDER — APIXABAN 5 MG PO TABS
5.0000 mg | ORAL_TABLET | Freq: Two times a day (BID) | ORAL | Status: DC
  Administered 2020-03-07: 5 mg via ORAL
  Filled 2020-03-07: qty 1

## 2020-03-07 MED ORDER — MAGNESIUM SULFATE 2 GM/50ML IV SOLN
2.0000 g | Freq: Once | INTRAVENOUS | Status: AC
  Administered 2020-03-07: 2 g via INTRAVENOUS

## 2020-03-07 MED ORDER — FLORANEX PO PACK
1.0000 g | PACK | Freq: Three times a day (TID) | ORAL | Status: DC
  Administered 2020-03-07 – 2020-03-15 (×17): 1 g via ORAL
  Filled 2020-03-07 (×25): qty 1

## 2020-03-07 NOTE — Consult Note (Addendum)
WOC Nurse Consult Note: Patient receiving care in [facility] [phone] Consult completed remotely after review of chart, [place] with CN Erin and usage of Camera in the room.  Reason for Consult: Right shin wound and left heel PI Wound type: Right shin abrasion full thickness from unknown cause, dark red, bleeding.  Left heel purple DTPI. Pressure Injury POA: No Measurement: Deferred Dressing procedure/placement/frequency: Continue foam dressing to left heel. Pull down each shift to assess. Change every 3 days. Place both heels in Prevalon boots.  Clean right shin wound with soap and water, dry and place a piece of Aquacel Advantage [place] # [id number] over the wound, cover with thick stack of 4x4s (until bleeding stops) the wrap with Kerlix. Change daily.   Monitor the wound area(s) for worsening of condition such as: Signs/symptoms of infection, increase in size, development of or worsening of odor, development of pain, or increased pain at the affected locations.   Notify the medical team if any of these develop.  Thank you for the consult. WOC nurse will not follow at this time.   Please re-consult the WOC team if needed.  Renaldo Reel Katrinka Blazing, MSN, RN, CMSRN, Angus Seller, [facility] Wound Treatment Associate Pager [phone]

## 2020-03-07 NOTE — Progress Notes (Signed)
ANTICOAGULATION CONSULT NOTE - Initial Consult  Pharmacy Consult for lovenox>>apixaban per PTA Indication: h/o PE  Allergies  Allergen Reactions  . Varenicline Other (See Comments)    Suicidal thoughts Bad dreams Other reaction(s): Other (See Comments) Suicidal thoughts  . Gabapentin Other (See Comments)    dizziness    Patient Measurements: Height: 5\' 8"  (172.7 cm) Weight: 97.2 kg (214 lb 4.6 oz) IBW/kg (Calculated) : 68.4   Vital Signs: Temp: 97.8 F (36.6 C) (01/27 1225) Temp Source: Axillary (01/27 1225) BP: 107/75 (01/27 1225) Pulse Rate: 99 (01/27 1225)  Labs: Recent Labs    03/05/20 0037 03/06/20 0107 03/07/20 0138  HGB 10.6* 10.2* 10.3*  HCT 34.0* 32.2* 32.3*  PLT 119* 108* 93*  CREATININE 1.68* 1.28* 1.30*    Estimated Creatinine Clearance: 55.5 mL/min (A) (by C-G formula based on SCr of 1.3 mg/dL (H)).   Medical History: Past Medical History:  Diagnosis Date  . AAA (abdominal aortic aneurysm) (HCC)   . Allergy    Rhinitis  . Arthritis    Rhuematoid  . Asthma   . CHF (congestive heart failure) (HCC)    chronic diastolic (congestive) heart failure from notes from [facility] face sheet  . Chronic renal insufficiency   . Depression    per notes from [facility] on chart  . Diverticulosis   . Emphysema   . Falls frequently   . GERD (gastroesophageal reflux disease)   . Hearing loss of both ears   . Hiatal hernia   . Hyperlipidemia   . Hypertension   . Male hypogonadism   . Nephrolithiasis   . Nephrolithiasis   . Sleep apnea    On CPAP  . Stasis dermatitis   . Varicose veins   . Vitamin B 12 deficiency    Assessment: 75 yo male with h/o PE on lovenox currently due to mentation issues. Mentation improved, so MD will resume apixaban per home dose.   Goal of Therapy:  Prevention of VTE Monitor platelets by anticoagulation protocol: Yes   Plan:  D/C lovenox Start Apixaban 5mg  PO BID  Leondre Taul A. 61, PharmD, BCPS,  FNKF Clinical Pharmacist [place] Please utilize Amion for appropriate phone number to reach the unit pharmacist [facility])   03/07/2020,3:54 PM

## 2020-03-07 NOTE — Progress Notes (Signed)
Nutrition Follow-up  DOCUMENTATION CODES:   Obesity unspecified  INTERVENTION:  Continue Ensure Enlive po BID, each supplement provides 350 kcal and 20 grams of protein  Encourage adequate PO intake.   NUTRITION DIAGNOSIS:   Increased nutrient needs related to catabolic illness (COVID+) as evidenced by estimated needs; ongoing  GOAL:   Patient will meet greater than or equal to 90% of their needs; progressing  MONITOR:   PO intake,Supplement acceptance,Skin,Weight trends,Labs,I & O's  REASON FOR ASSESSMENT:   Malnutrition Screening Tool    ASSESSMENT:   75 year old residing at [facility] health care, chronic diastolic CHF, HTN, AAA, PE, rheumatoid arthritis, hypogonadism, depression, COPD on chronic 2 L nasal cannula, OSA, HLD was sent to the hospital for poor oral intake and left-sided facial swelling concerning for parotitis. Pt COVID positive. Pt with Severe sepsis secondary to left parotitis.  Pt is currently on a dysphagia 2 diet with thin liquids. Meal completion has been 75-90%. Pt currently has Ensure ordered and has been consuming them. RD to continue with current orders to aid in caloric and protein needs.   Labs and medications reviewed. Sodium elevated at 147. IV fluids infusing.   Diet Order:   Diet Order            DIET DYS 2 Room service appropriate? Yes with Assist; Fluid consistency: Thin  Diet effective now                 EDUCATION NEEDS:   Not appropriate for education at this time  Skin:  Skin Assessment: Skin Integrity Issues: Skin Integrity Issues:: DTI DTI: L heel  Last BM:  1/27  Height:   Ht Readings from Last 1 Encounters:  03/02/20 5\' 8"  (1.727 m)    Weight:   Wt Readings from Last 1 Encounters:  03/04/20 97.2 kg    BMI:  Body mass index is 32.58 kg/m.  Estimated Nutritional Needs:   Kcal:  2000-2200  Protein:  105-120 grams  Fluid:  >/= 2 L/day  03/06/20, MS, RD, LDN RD pager number/after hours weekend  pager number on Amion.

## 2020-03-07 NOTE — Progress Notes (Signed)
PROGRESS NOTE    Chad Duke  ZOX:[id number] DOB: 1945-09-14 DOA: 03/02/2020 PCP: Soundra Pilon, FNP   Brief Narrative:  75 year old residing at [facility] health care, chronic diastolic CHF, HTN, AAA, PE, rheumatoid arthritis, hypogonadism, depression, COPD on chronic 2 L nasal cannula, OSA, HLD was sent to the hospital for poor oral intake and left-sided facial swelling concerning for parotitis.  Patient was diagnosed of COVID 2 weeks ago and treated at the facility.  Seen by ENT in the ER started on Decadron; warm compresses and Unasyn.  Seen by speech therapist, started on dysphagia one diet.  PT is recommending SNF, TOC team aware.  Subjective:  No significant events overnight as discussed with staff, improved oral intake, Foley catheter discontinued yesterday, so far no evidence of retention.  Assessment & Plan:   Principal Problem:   Severe sepsis (HCC) Active Problems:   COPD GOLD III    Sleep apnea   Rheumatoid arthritis (HCC)   Morbid obesity due to excess calories (HCC)   History of pulmonary embolism   Acute renal failure superimposed on stage 3 chronic kidney disease (HCC)   Controlled type 2 diabetes mellitus with hyperglycemia (HCC)   Parotitis  Severe sepsis secondary to left parotitis - Sepsis physiology is improving.  Warm compresses.  Aggressive massage - ENT-continue Unasyn, Decadron and warm compresses. - Pain control -CT cervical spine-anterior dislocation of left mandibular condyle with soft tissue swelling  Acute metabolic encephalopathy - CT head negative for acute changes - Ammonia-18, UA-negative -MRI brain with no acute findings -We will will correct  his hypernatremia as might be contributing. -Possible related to hospital delirium, he had some slight improvement today.  Hypomagnesemia -Repleted  Thrombocytopenia -Mild, continue to monitor.    Acute kidney injury on CKD stage IIIa, prerenal - Baseline creatinine 1.8.  Admission creatinine  3.22.  Creatinine this morning 1.28 - Lasix on hold  Dysphagia likely from peritonitis -Patient seen by speech yesterday.  Doing slightly better.  Currently on dysphagia one diet.  Continue Accu-Cheks.  Hypokalemia with hypernatremia -Continue D5 half-normal saline and aggressive potassium repletion.  Crease IV fluid 100 cc/h.  Continue Accu-Cheks for now.  Recent COVID-19 infection, asymptomatic -Chest x-ray negative for any active infiltrates.  Currently on 2 L nasal cannula  Chronic hypoxia secondary to COPD Abnormal breath sounds with bilateral rhonchi - Home uses 2-3 L nasal cannula. - Continue bronchodilators, already on steroids  History of pulmonary embolism - On home Eliquis , currently due to mentation, mentation appears to be improving, will resume back on Eliquis.  Diabetes mellitus type 2 - Glucophage on hold -Accu-Cheks and sliding scale  History of gout - Continue allopurinol  Right lower extremity wound -Wound care consulted  Foley catheter discontinued 1/26, no evidence of retention.  DVT prophylaxis: Lovenox Code Status: DNR Family Communication:  Called Hedi 1/26 no answer. Left a voicemail.   Status is: Inpatient  Remains inpatient appropriate because:IV treatments appropriate due to intensity of illness or inability to take PO   Dispo: The patient is from: Home              Anticipated d/c is to: SNF              Anticipated d/c date is: 2 days              Patient currently is not medically stable to d/c.  Maintain hospital stay until his p.o. intake is not consistent, he requires IV antibiotics.  In the  meantime awaiting his mental status to improve   Difficult to place patient No   Body mass index is 32.58 kg/m.  Pressure Injury 03/06/20 Heel Left Deep Tissue Pressure Injury - Purple or maroon localized area of discolored intact skin or blood-filled blister due to damage of underlying soft tissue from pressure and/or shear. (Active)   03/06/20 1932  Location: Heel  Location Orientation: Left  Staging: Deep Tissue Pressure Injury - Purple or maroon localized area of discolored intact skin or blood-filled blister due to damage of underlying soft tissue from pressure and/or shear.  Wound Description (Comments):   Present on Admission:      Examination:  Patient is awake, easily distracted, remains confused, but today he is following commands and answering yes and no questions, still with significant left facial swelling but it is improving . Symmetrical Chest wall movement, Good air movement bilaterally, CTAB RRR,No Gallops,Rubs or new Murmurs, No Parasternal Heave +ve B.Sounds, Abd Soft, No tenderness, No rebound - guarding or rigidity. No Cyanosis, Clubbing or edema, right lower extremity wound bandaged     Objective: Vitals:   03/06/20 2357 03/07/20 0342 03/07/20 0738 03/07/20 1225  BP: (!) 159/94 (!) 147/80 139/88 107/75  Pulse: 99 67 93 99  Resp: Temp: 97.7 F (36.5 C)  98.7 F (37.1 C) 97.8 F (36.6 C)  TempSrc: Axillary  Axillary Axillary  SpO2: 100% 99% 99% 99%  Weight:      Height:        Intake/Output Summary (Last 24 hours) at 03/07/2020 1530 Last data filed at 03/07/2020 0900 Gross per 24 hour  Intake 340 ml  Output 600 ml  Net -260 ml   Filed Weights   03/02/20 1146 03/04/20 0318  Weight: 99.8 kg 97.2 kg     Data Reviewed:   CBC: Recent Labs  Lab 03/02/20 1155 03/02/20 1355 03/03/20 0422 03/04/20 0131 03/05/20 0037 03/06/20 0107 03/07/20 0138  WBC 24.0*  --  18.5* 13.9* 10.1 8.5 7.5  NEUTROABS 18.3*  --   --   --   --   --   --   HGB 14.3   < > 11.3* 10.3* 10.6* 10.2* 10.3*  HCT 44.8   < > 36.5* 33.3* 34.0* 32.2* 32.3*  MCV 97.6  --  99.5 99.7 99.1 99.1 96.7  PLT 191  --  131* 125* 119* 108* 93*   < > = values in this interval not displayed.   Basic Metabolic Panel: Recent Labs  Lab 03/04/20 0131 03/04/20 1228 03/05/20 0037 03/06/20 0107  03/07/20 0138  NA 149* 151* 150* 150* 147*  K 2.8* 3.1* 3.1* 3.7 3.8  CL 114* 116* 116* 117* 113*  CO2 GLUCOSE 94 99 119* 66* 113*  BUN 48* 47* 47* 34* 23  CREATININE 1.96* 1.72* 1.68* 1.28* 1.30*  CALCIUM 8.5* 8.7* 8.8* 8.9 8.3*  MG 1.7  --  2.1 1.8 1.6*   GFR: Estimated Creatinine Clearance: 55.5 mL/min (A) (by C-G formula based on SCr of 1.3 mg/dL (H)). Liver Function Tests: Recent Labs  Lab 03/02/20 1155 03/02/20 1820  AST 28 25  ALT 26 21  ALKPHOS 102 81  BILITOT 1.5* 1.4*  PROT 7.6 5.7*  ALBUMIN 2.4* 1.9*   Recent Labs  Lab 03/02/20 1155  LIPASE 22   Recent Labs  Lab 03/02/20 1155  AMMONIA 18   Coagulation Profile: Recent Labs  Lab 03/02/20 1155  INR 1.8*   Cardiac  Enzymes: Recent Labs  Lab 03/02/20 1155  CKTOTAL 117   BNP (last 3 results) No results for input(s): PROBNP in the last 8760 hours. HbA1C: No results for input(s): HGBA1C in the last 72 hours. CBG: Recent Labs  Lab 03/06/20 1154 03/06/20 1758 03/06/20 2054 03/07/20 0740 03/07/20 1222  GLUCAP 101* 93 100* 106* 122*   Lipid Profile: No results for input(s): CHOL, HDL, LDLCALC, TRIG, CHOLHDL, LDLDIRECT in the last 72 hours. Thyroid Function Tests: No results for input(s): TSH, T4TOTAL, FREET4, T3FREE, THYROIDAB in the last 72 hours. Anemia Panel: No results for input(s): VITAMINB12, FOLATE, FERRITIN, TIBC, IRON, RETICCTPCT in the last 72 hours. Sepsis Labs: Recent Labs  Lab 03/02/20 1155 03/02/20 1639 03/04/20 0131  PROCALCITON  --   --  0.64  LATICACIDVEN 2.1* 1.7  --     Recent Results (from the past 240 hour(s))  Culture, blood (routine x 2)     Status: None   Collection Time: 03/02/20 11:43 AM   Specimen: BLOOD  Result Value Ref Range Status   Specimen Description BLOOD RIGHT ANTECUBITAL  Final   Special Requests   Final    BOTTLES DRAWN AEROBIC AND ANAEROBIC Blood Culture adequate volume   Culture   Final    NO GROWTH 5 DAYS Performed at [facility], [address] [place], [place] [address]    Report Status 03/07/2020 FINAL  Final  Urine culture     Status: None   Collection Time: 03/02/20 11:43 AM   Specimen: Urine, Random  Result Value Ref Range Status   Specimen Description URINE, RANDOM  Final   Special Requests NONE  Final   Culture   Final    NO GROWTH Performed at [facility], [address] [place], [place] [address]    Report Status 03/03/2020 FINAL  Final  SARS Coronavirus 2 by RT PCR (hospital order, performed in [facility] hospital lab) Nasopharyngeal Nasopharyngeal Swab     Status: Abnormal   Collection Time: 03/02/20 11:46 AM   Specimen: Nasopharyngeal Swab  Result Value Ref Range Status   SARS Coronavirus 2 POSITIVE (A) NEGATIVE Final    Comment: RESULT CALLED TO, READ BACK BY AND VERIFIED WITH: RN Colonel Bald [phone] AT 1405 BY CM (NOTE) SARS-CoV-2 target nucleic acids are DETECTED  SARS-CoV-2 RNA is generally detectable in upper respiratory specimens  during the acute phase of infection.  Positive results are indicative  of the presence of the identified virus, but do not rule out bacterial infection or co-infection with other pathogens not detected by the test.  Clinical correlation with patient history and  other diagnostic information is necessary to determine patient infection status.  The expected result is negative.  Fact Sheet for Patients:   BoilerBrush.com.cy   Fact Sheet for Healthcare Providers:   https://pope.com/    This test is not yet approved or cleared by the [place] FDA and  has been authorized for detection and/or diagnosis of SARS-CoV-2 by FDA under an Emergency Use Authorization (EUA).  This EUA will remain in effect (meaning this test c an be used) for the duration of  the COVID-19 declaration under Section 564(b)(1) of the Act, 21 U.S.C. section 360-bbb-3(b)(1), unless the authorization is terminated or  revoked sooner.  Performed at [facility], [address] [place], [place] [address]   Culture, blood (routine x 2)     Status: None (Preliminary result)   Collection Time: 03/03/20  4:22 AM  Specimen: BLOOD  Result Value Ref Range Status   Specimen Description BLOOD SITE NOT SPECIFIED  Final   Special Requests   Final    BOTTLES DRAWN AEROBIC ONLY Blood Culture results may not be optimal due to an inadequate volume of blood received in culture bottles   Culture   Final    NO GROWTH 4 DAYS Performed at [facility], [address], [place], [place] [address]    Report Status PENDING  Incomplete  MRSA PCR Screening     Status: Abnormal   Collection Time: 03/03/20  8:47 PM   Specimen: Nasopharyngeal  Result Value Ref Range Status   MRSA by PCR POSITIVE (A) NEGATIVE Final    Comment:        The GeneXpert MRSA Assay (FDA approved for NASAL specimens only), is one component of a comprehensive MRSA colonization surveillance program. It is not intended to diagnose MRSA infection nor to guide or monitor treatment for MRSA infections. RESULT CALLED TO, READ BACK BY AND VERIFIED WITH: [facility] RN [phone] 03/03/2020 MITCHELL,L Performed at [facility], [address] [place], [place] [address]          Radiology Studies: MR BRAIN WO CONTRAST  Result Date: 03/06/2020 CLINICAL DATA:  Delirium. Currently being treated for acute left parotitis with sepsis. EXAM: MRI HEAD WITHOUT CONTRAST TECHNIQUE: Multiplanar, multiecho pulse sequences of the brain and surrounding structures were obtained without intravenous contrast. COMPARISON:  Head and maxillofacial CTs 03/02/2020. Head MRI 01/19/2020. FINDINGS: Brain: There is no evidence of an acute infarct, intracranial hemorrhage, mass, midline shift, or extra-axial fluid collection. T2 hyperintensities in the cerebral white matter and pons are similar to the prior MRI and are nonspecific but compatible with moderate chronic  small vessel ischemic disease. There is moderate cerebral atrophy. Vascular: Major intracranial vascular flow voids are preserved. Skull and upper cervical spine: Unremarkable bone marrow signal. Sinuses/Orbits: Unremarkable orbits. Paranasal sinuses and mastoid air cells are clear. Other: Left parotid swelling consistent related to known parotitis. IMPRESSION: 1. No acute intracranial abnormality. 2. Moderate chronic small vessel ischemic disease and cerebral atrophy. 3. Acute left parotitis. Electronically Signed   By: Sebastian Ache M.D.   On: 03/06/2020 17:20        Scheduled Meds: . budesonide  1 puff Inhalation BID  . enoxaparin (LOVENOX) injection  1 mg/kg Subcutaneous Q12H  . feeding supplement  237 mL Oral BID BM  . influenza vaccine adjuvanted  0.5 mL Intramuscular Tomorrow-1000  . insulin aspart  0-15 Units Subcutaneous TID WC  . insulin aspart  0-5 Units Subcutaneous QHS  . Ipratropium-Albuterol  1 puff Inhalation TID  . mupirocin ointment  1 application Nasal BID  . pantoprazole  40 mg Oral Daily   Continuous Infusions: . ampicillin-sulbactam (UNASYN) IV 3 g (03/07/20 0616)  . dextrose 5 % and 0.45% NaCl 100 mL/hr at 03/06/20 2227     LOS: 5 days     Huey Bienenstock, MD [facility]  If 7PM-7AM, please contact night-coverage  03/07/2020, 3:30 PM

## 2020-03-07 NOTE — Progress Notes (Signed)
  Speech Language Pathology Treatment: Dysphagia  Patient Details Name: Chad Duke MRN: [id number] DOB: Jun 22, 1945 Today's Date: 03/07/2020 Time: 4098-1191 SLP Time Calculation (min) (ACUTE ONLY): 8 min  Assessment / Plan / Recommendation Clinical Impression  Pt was very difficult to arousae this am. He had a breakfast tray at bedside with 50% consumed, but when SLP attempted to reposition, awaken with wet washcloth, sternal rub. Pt only partially opened eyes and mumbled. He was resistant to tactile cue for oral are and feeding, only accepted a small sip. Attempted to discuss with RN but she was busy with pt care. Will f/u   HPI HPI: Chad Duke is a 75 y/o M with recent history of Covid 19-infection, with AMS, dehydration and left acute parotitis, poor oral intake. Per ENT- No acute intervention of anteriorly subluxed left mandible. Difficult to determine if this is secondary to severe edema or from recent dislocation. ENT requested heated compress, massage and oral hydration when appropriate.      SLP Plan  Continue with current plan of care       Recommendations  Diet recommendations: Dysphagia 2 (fine chop);Thin liquid Liquids provided via: Cup;Straw Medication Administration: Whole meds with puree Supervision: Staff to assist with self feeding Compensations: Slow rate;Small sips/bites                Oral Care Recommendations: Oral care BID Follow up Recommendations: Skilled Nursing facility;24 hour supervision/assistance SLP Visit Diagnosis: Dysphagia, unspecified (R13.10) Plan: Continue with current plan of care       GO                Rhonda Vangieson, Riley Nearing 03/07/2020, 10:35 AM

## 2020-03-07 NOTE — Progress Notes (Signed)
Pharmacy Antibiotic Note  Chad Duke is a 75 y.o. male admitted on 03/02/2020 with sepsis due to parotitis.  Pharmacy has been consulted for Unasyn dosing.  Plan: Unasyn 3g IV every 8 hours Monitor renal function, Cx and clinical progression to narrow Change to PO when mentation and oral intake improve?  Height: 5\' 8"  (172.7 cm) Weight: 97.2 kg (214 lb 4.6 oz) IBW/kg (Calculated) : 68.4  Temp (24hrs), Avg:98 F (36.7 C), Min:97.7 F (36.5 C), Max:98.7 F (37.1 C)  Recent Labs  Lab 03/02/20 1155 03/02/20 1639 03/02/20 1820 03/03/20 0422 03/04/20 0131 03/04/20 1228 03/05/20 0037 03/06/20 0107 03/07/20 0138  WBC 24.0*  --   --  18.5* 13.9*  --  10.1 8.5 7.5  CREATININE 3.22*  --    < >  --  1.96* 1.72* 1.68* 1.28* 1.30*  LATICACIDVEN 2.1* 1.7  --   --   --   --   --   --   --    < > = values in this interval not displayed.    Estimated Creatinine Clearance: 55.5 mL/min (A) (by C-G formula based on SCr of 1.3 mg/dL (H)).    Allergies  Allergen Reactions  . Varenicline Other (See Comments)    Suicidal thoughts Bad dreams Other reaction(s): Other (See Comments) Suicidal thoughts  . Gabapentin Other (See Comments)    dizziness   Yasemin Rabon A. 03/09/20, PharmD, BCPS, FNKF Clinical Pharmacist [place] Please utilize Amion for appropriate phone number to reach the unit pharmacist [facility])   03/07/2020 9:01 AM

## 2020-03-07 NOTE — Progress Notes (Signed)
Pt still wearing mitts, CPAP not indicated.  No distress noted.

## 2020-03-08 ENCOUNTER — Other Ambulatory Visit (HOSPITAL_COMMUNITY): Payer: Medicare Other

## 2020-03-08 ENCOUNTER — Inpatient Hospital Stay (HOSPITAL_COMMUNITY): Payer: Medicare Other

## 2020-03-08 DIAGNOSIS — N179 Acute kidney failure, unspecified: Secondary | ICD-10-CM | POA: Diagnosis not present

## 2020-03-08 DIAGNOSIS — E1165 Type 2 diabetes mellitus with hyperglycemia: Secondary | ICD-10-CM | POA: Diagnosis not present

## 2020-03-08 DIAGNOSIS — K1120 Sialoadenitis, unspecified: Secondary | ICD-10-CM | POA: Diagnosis not present

## 2020-03-08 LAB — MRSA PCR SCREENING: MRSA by PCR: POSITIVE — AB

## 2020-03-08 LAB — GLUCOSE, CAPILLARY
Glucose-Capillary: 123 mg/dL — ABNORMAL HIGH (ref 70–99)
Glucose-Capillary: 125 mg/dL — ABNORMAL HIGH (ref 70–99)
Glucose-Capillary: 185 mg/dL — ABNORMAL HIGH (ref 70–99)
Glucose-Capillary: 101 mg/dL — ABNORMAL HIGH (ref 70–99)

## 2020-03-08 LAB — AMMONIA: Ammonia: 34 umol/L (ref 9–35)

## 2020-03-08 LAB — BASIC METABOLIC PANEL
Anion gap: 11 (ref 5–15)
BUN: 20 mg/dL (ref 8–23)
CO2: 24 mmol/L (ref 22–32)
Calcium: 7.9 mg/dL — ABNORMAL LOW (ref 8.9–10.3)
Chloride: 110 mmol/L (ref 98–111)
Creatinine, Ser: 1.18 mg/dL (ref 0.61–1.24)
GFR, Estimated: >60 mL/min (ref >60)
Glucose, Bld: 127 mg/dL — ABNORMAL HIGH (ref 70–99)
Potassium: 3.1 mmol/L — ABNORMAL LOW (ref 3.5–5.1)
Sodium: 145 mmol/L (ref 135–145)

## 2020-03-08 LAB — CBC
HCT: 30.7 % — ABNORMAL LOW (ref 39.0–52.0)
Hemoglobin: 9.7 g/dL — ABNORMAL LOW (ref 13.0–17.0)
MCH: 31.1 pg (ref 26.0–34.0)
MCHC: 31.6 g/dL (ref 30.0–36.0)
MCV: 98.4 fL (ref 80.0–100.0)
Platelets: 102 10*3/uL — ABNORMAL LOW (ref 150–400)
RBC: 3.12 MIL/uL — ABNORMAL LOW (ref 4.22–5.81)
RDW: 16.9 % — ABNORMAL HIGH (ref 11.5–15.5)
WBC: 5.9 10*3/uL (ref 4.0–10.5)
nRBC: 0 % (ref 0.0–0.2)

## 2020-03-08 LAB — TSH: TSH: 2.478 u[IU]/mL (ref 0.350–4.500)

## 2020-03-08 LAB — VITAMIN B12: Vitamin B-12: 618 pg/mL (ref 180–914)

## 2020-03-08 LAB — MAGNESIUM: Magnesium: 2 mg/dL (ref 1.7–2.4)

## 2020-03-08 LAB — CULTURE, BLOOD (ROUTINE X 2): Culture: NO GROWTH

## 2020-03-08 LAB — FOLATE: Folate: 4.1 ng/mL — ABNORMAL LOW (ref >5.9)

## 2020-03-08 MED ORDER — APIXABAN 5 MG PO TABS
5.0000 mg | ORAL_TABLET | Freq: Two times a day (BID) | ORAL | Status: DC
  Administered 2020-03-09 – 2020-03-15 (×13): 5 mg via ORAL
  Filled 2020-03-08 (×13): qty 1

## 2020-03-08 MED ORDER — POTASSIUM CHLORIDE 20 MEQ PO PACK
40.0000 meq | PACK | ORAL | Status: AC
  Administered 2020-03-08 (×2): 40 meq via ORAL
  Filled 2020-03-08 (×2): qty 2

## 2020-03-08 MED ORDER — QUETIAPINE FUMARATE 25 MG PO TABS
25.0000 mg | ORAL_TABLET | Freq: Every day | ORAL | Status: DC
  Administered 2020-03-08: 25 mg via ORAL
  Filled 2020-03-08: qty 1

## 2020-03-08 MED ORDER — SODIUM CHLORIDE 0.9 % IV SOLN
3.0000 g | Freq: Four times a day (QID) | INTRAVENOUS | Status: AC
  Administered 2020-03-08 – 2020-03-11 (×14): 3 g via INTRAVENOUS
  Filled 2020-03-08 (×2): qty 8
  Filled 2020-03-08 (×2): qty 3
  Filled 2020-03-08: qty 8
  Filled 2020-03-08 (×2): qty 3
  Filled 2020-03-08: qty 8
  Filled 2020-03-08: qty 0.07
  Filled 2020-03-08 (×7): qty 8

## 2020-03-08 MED ORDER — POTASSIUM CHLORIDE CRYS ER 20 MEQ PO TBCR: 40.0000 meq | EXTENDED_RELEASE_TABLET | ORAL | Status: DC

## 2020-03-08 NOTE — Progress Notes (Signed)
PROGRESS NOTE    Chad Duke  NWG:[id number] DOB: 1946-01-24 DOA: 03/02/2020 PCP: Soundra Pilon, FNP   Brief Narrative:  75 year old residing at [facility] health care, chronic diastolic CHF, HTN, AAA, PE, rheumatoid arthritis, hypogonadism, depression, COPD on chronic 2 L nasal cannula, OSA, HLD was sent to the hospital for poor oral intake and left-sided facial swelling concerning for parotitis.  Patient was diagnosed of COVID 2 weeks ago and treated at the facility.  Seen by ENT in the ER started on Decadron; warm compresses and Unasyn.  Seen by speech therapist, started on dysphagia one diet.  PT is recommending SNF, TOC team aware.  Subjective:  Weekend events overnight as discussed with staff, no evidence of urinary retention, he remains confused .  Assessment & Plan:   Principal Problem:   Severe sepsis (HCC) Active Problems:   COPD GOLD III    Sleep apnea   Rheumatoid arthritis (HCC)   Morbid obesity due to excess calories (HCC)   History of pulmonary embolism   Acute renal failure superimposed on stage 3 chronic kidney disease (HCC)   Controlled type 2 diabetes mellitus with hyperglycemia (HCC)   Parotitis  Severe sepsis secondary to left parotitis - Sepsis physiology is improving.  Warm compresses.  Aggressive massage - ENT-continue Unasyn, Decadron and warm compresses. - Pain control -CT cervical spine-anterior dislocation of left mandibular condyle with soft tissue swelling -Discussed with staff to continue warm compress, and aggressive massage  Acute metabolic encephalopathy - CT head negative for acute changes - Ammonia-18, UA-negative -MRI brain with no acute findings -May be related to hospital delirium, started on low-dose Seroquel -will check EEG, B12, folic acid, TSH and RPR   Hypomagnesemia -Repleted  Thrombocytopenia -Mild, continue to monitor.    Acute kidney injury on CKD stage IIIa, prerenal - Baseline creatinine 1.8.  Admission creatinine  3.22.  - resolved - Lasix on hold  Dysphagia likely from peritonitis -Patient seen by speech yesterday.  Doing slightly better.  Currently on dysphagia one diet.  Continue Accu-Cheks.  Hypokalemia -Potassium 3.1, repleted  hypernatremia -Continue D5 half-normal saline and aggressive potassium repletion.    Recent COVID-19 infection, asymptomatic -Chest x-ray negative for any active infiltrates.  Currently on 2 L nasal cannula  Chronic hypoxia secondary to COPD - Home uses 2-3 L nasal cannula. - Continue bronchodilators, already on steroids  History of pulmonary embolism - On home Eliquis , currently due to mentation, mentation appears to be improving, will resume back on Eliquis.  Diabetes mellitus type 2 - Glucophage on hold -Accu-Cheks and sliding scale  History of gout - Continue allopurinol  Right lower extremity wound -Wound care consulted -Patient had small amount of oozing from the wound, will hold Eliquis for 24 hours.  Foley catheter discontinued 1/26, no evidence of retention.  DVT prophylaxis: Lovenox Code Status: DNR Family Communication: D/W with his niece Haydi, HCPOA Status is: Inpatient  Remains inpatient appropriate because:IV treatments appropriate due to intensity of illness or inability to take PO   Dispo: The patient is from: Home              Anticipated d/c is to: SNF              Anticipated d/c date is: 2 days              Patient currently is not medically stable to d/c.  Maintain hospital stay until his p.o. intake is not consistent, he requires IV antibiotics.  In the meantime  awaiting his mental status to improve   Difficult to place patient No   Body mass index is 32.58 kg/m.  Pressure Injury 03/06/20 Heel Left Deep Tissue Pressure Injury - Purple or maroon localized area of discolored intact skin or blood-filled blister due to damage of underlying soft tissue from pressure and/or shear. (Active)  03/06/20 1932  Location: Heel   Location Orientation: Left  Staging: Deep Tissue Pressure Injury - Purple or maroon localized area of discolored intact skin or blood-filled blister due to damage of underlying soft tissue from pressure and/or shear.  Wound Description (Comments):   Present on Admission:      Examination:  Awake, frail, deconditioned, weak, confused  Symmetrical Chest wall movement, Good air movement bilaterally, CTAB RRR,No Gallops,Rubs or new Murmurs, No Parasternal Heave +ve B.Sounds, Abd Soft, No tenderness, No rebound - guarding or rigidity. No Cyanosis, Clubbing or edema, No new Rash or bruise   Patient with small right lower extremity wound with scant bleeding.       Objective: Vitals:   03/08/20 0301 03/08/20 0448 03/08/20 0758 03/08/20 1149  BP:  128/73 (!) 115/91 127/81  Pulse:  93 93 100  Resp:  17 20 20   Temp:  97.8 F (36.6 C) 98.4 F (36.9 C) 98 F (36.7 C)  TempSrc:  Axillary Oral Axillary  SpO2: 100% 96% 94% 100%  Weight:      Height:        Intake/Output Summary (Last 24 hours) at 03/08/2020 1451 Last data filed at 03/08/2020 0454 Gross per 24 hour  Intake 120 ml  Output 600 ml  Net -480 ml   Filed Weights   03/02/20 1146 03/04/20 0318  Weight: 99.8 kg 97.2 kg     Data Reviewed:   CBC: Recent Labs  Lab 03/02/20 1155 03/02/20 1355 03/04/20 0131 03/05/20 0037 03/06/20 0107 03/07/20 0138 03/08/20 0140  WBC 24.0*   < > 13.9* 10.1 8.5 7.5 5.9  NEUTROABS 18.3*  --   --   --   --   --   --   HGB 14.3   < > 10.3* 10.6* 10.2* 10.3* 9.7*  HCT 44.8   < > 33.3* 34.0* 32.2* 32.3* 30.7*  MCV 97.6   < > 99.7 99.1 99.1 96.7 98.4  PLT 191   < > 125* 119* 108* 93* 102*   < > = values in this interval not displayed.   Basic Metabolic Panel: Recent Labs  Lab 03/04/20 0131 03/04/20 1228 03/05/20 0037 03/06/20 0107 03/07/20 0138 03/08/20 0140  NA 149* 151* 150* 150* 147* 145  K 2.8* 3.1* 3.1* 3.7 3.8 3.1*  CL 114* 116* 116* 117* 113* 110  CO2 23 23 24 24 25  24   GLUCOSE 94 99 119* 66* 113* 127*  BUN 48* 47* 47* 34* 23 20  CREATININE 1.96* 1.72* 1.68* 1.28* 1.30* 1.18  CALCIUM 8.5* 8.7* 8.8* 8.9 8.3* 7.9*  MG 1.7  --  2.1 1.8 1.6* 2.0   GFR: Estimated Creatinine Clearance: 61.1 mL/min (by C-G formula based on SCr of 1.18 mg/dL). Liver Function Tests: Recent Labs  Lab 03/02/20 1155 03/02/20 1820  AST 28 25  ALT 26 21  ALKPHOS 102 81  BILITOT 1.5* 1.4*  PROT 7.6 5.7*  ALBUMIN 2.4* 1.9*   Recent Labs  Lab 03/02/20 1155  LIPASE 22   Recent Labs  Lab 03/02/20 1155  AMMONIA 18   Coagulation Profile: Recent Labs  Lab 03/02/20 1155  INR 1.8*   Cardiac  Enzymes: Recent Labs  Lab 03/02/20 1155  CKTOTAL 117   BNP (last 3 results) No results for input(s): PROBNP in the last 8760 hours. HbA1C: No results for input(s): HGBA1C in the last 72 hours. CBG: Recent Labs  Lab 03/07/20 1222 03/07/20 1658 03/07/20 2135 03/08/20 0800 03/08/20 1147  GLUCAP 122* 108* 183* 123* 125*   Lipid Profile: No results for input(s): CHOL, HDL, LDLCALC, TRIG, CHOLHDL, LDLDIRECT in the last 72 hours. Thyroid Function Tests: No results for input(s): TSH, T4TOTAL, FREET4, T3FREE, THYROIDAB in the last 72 hours. Anemia Panel: No results for input(s): VITAMINB12, FOLATE, FERRITIN, TIBC, IRON, RETICCTPCT in the last 72 hours. Sepsis Labs: Recent Labs  Lab 03/02/20 1155 03/02/20 1639 03/04/20 0131  PROCALCITON  --   --  0.64  LATICACIDVEN 2.1* 1.7  --     Recent Results (from the past 240 hour(s))  Culture, blood (routine x 2)     Status: None   Collection Time: 03/02/20 11:43 AM   Specimen: BLOOD  Result Value Ref Range Status   Specimen Description BLOOD RIGHT ANTECUBITAL  Final   Special Requests   Final    BOTTLES DRAWN AEROBIC AND ANAEROBIC Blood Culture adequate volume   Culture   Final    NO GROWTH 5 DAYS Performed at [facility], [address] [place], [place] [address]    Report Status 03/07/2020 FINAL  Final   Urine culture     Status: None   Collection Time: 03/02/20 11:43 AM   Specimen: Urine, Random  Result Value Ref Range Status   Specimen Description URINE, RANDOM  Final   Special Requests NONE  Final   Culture   Final    NO GROWTH Performed at [facility], [address] [place], [place] [address]    Report Status 03/03/2020 FINAL  Final  SARS Coronavirus 2 by RT PCR (hospital order, performed in [facility] hospital lab) Nasopharyngeal Nasopharyngeal Swab     Status: Abnormal   Collection Time: 03/02/20 11:46 AM   Specimen: Nasopharyngeal Swab  Result Value Ref Range Status   SARS Coronavirus 2 POSITIVE (A) NEGATIVE Final    Comment: RESULT CALLED TO, READ BACK BY AND VERIFIED WITH: RN Colonel Bald [phone] AT 1405 BY CM (NOTE) SARS-CoV-2 target nucleic acids are DETECTED  SARS-CoV-2 RNA is generally detectable in upper respiratory specimens  during the acute phase of infection.  Positive results are indicative  of the presence of the identified virus, but do not rule out bacterial infection or co-infection with other pathogens not detected by the test.  Clinical correlation with patient history and  other diagnostic information is necessary to determine patient infection status.  The expected result is negative.  Fact Sheet for Patients:   BoilerBrush.com.cy   Fact Sheet for Healthcare Providers:   https://pope.com/    This test is not yet approved or cleared by the [place] FDA and  has been authorized for detection and/or diagnosis of SARS-CoV-2 by FDA under an Emergency Use Authorization (EUA).  This EUA will remain in effect (meaning this test c an be used) for the duration of  the COVID-19 declaration under Section 564(b)(1) of the Act, 21 U.S.C. section 360-bbb-3(b)(1), unless the authorization is terminated or revoked sooner.  Performed at [facility], [address], [place], [place] [address]    Culture, blood (routine x 2)     Status: None   Collection Time: 03/03/20  4:22 AM   Specimen:  BLOOD  Result Value Ref Range Status   Specimen Description BLOOD SITE NOT SPECIFIED  Final   Special Requests   Final    BOTTLES DRAWN AEROBIC ONLY Blood Culture results may not be optimal due to an inadequate volume of blood received in culture bottles   Culture   Final    NO GROWTH 5 DAYS Performed at [facility], [address] [place], [place] [address]    Report Status 03/08/2020 FINAL  Final  MRSA PCR Screening     Status: Abnormal   Collection Time: 03/03/20  8:47 PM   Specimen: Nasopharyngeal  Result Value Ref Range Status   MRSA by PCR POSITIVE (A) NEGATIVE Final    Comment:        The GeneXpert MRSA Assay (FDA approved for NASAL specimens only), is one component of a comprehensive MRSA colonization surveillance program. It is not intended to diagnose MRSA infection nor to guide or monitor treatment for MRSA infections. RESULT CALLED TO, READ BACK BY AND VERIFIED WITH: [facility] RN 2249 03/03/2020 MITCHELL,L Performed at [facility] Lab, [address], [place], [place] [address]   MRSA PCR Screening     Status: Abnormal   Collection Time: 03/08/20  8:47 AM  Result Value Ref Range Status   MRSA by PCR POSITIVE (A) NEGATIVE Final    Comment:        The GeneXpert MRSA Assay (FDA approved for NASAL specimens only), is one component of a comprehensive MRSA colonization surveillance program. It is not intended to diagnose MRSA infection nor to guide or monitor treatment for MRSA infections. RESULT CALLED TO, READ BACK BY AND VERIFIED WITH: Timoteo Expose RN 12:40 03/08/20 ([email or web address]) Performed at [facility], [address] [place], [place] [address]          Radiology Studies: MR BRAIN WO CONTRAST  Result Date: 03/06/2020 CLINICAL DATA:  Delirium. Currently being treated for acute left parotitis with sepsis. EXAM: MRI HEAD WITHOUT CONTRAST  TECHNIQUE: Multiplanar, multiecho pulse sequences of the brain and surrounding structures were obtained without intravenous contrast. COMPARISON:  Head and maxillofacial CTs 03/02/2020. Head MRI 01/19/2020. FINDINGS: Brain: There is no evidence of an acute infarct, intracranial hemorrhage, mass, midline shift, or extra-axial fluid collection. T2 hyperintensities in the cerebral white matter and pons are similar to the prior MRI and are nonspecific but compatible with moderate chronic small vessel ischemic disease. There is moderate cerebral atrophy. Vascular: Major intracranial vascular flow voids are preserved. Skull and upper cervical spine: Unremarkable bone marrow signal. Sinuses/Orbits: Unremarkable orbits. Paranasal sinuses and mastoid air cells are clear. Other: Left parotid swelling consistent related to known parotitis. IMPRESSION: 1. No acute intracranial abnormality. 2. Moderate chronic small vessel ischemic disease and cerebral atrophy. 3. Acute left parotitis. Electronically Signed   By: Sebastian Ache M.D.   On: 03/06/2020 17:20        Scheduled Meds: . budesonide  1 puff Inhalation BID  . feeding supplement  237 mL Oral BID BM  . influenza vaccine adjuvanted  0.5 mL Intramuscular Tomorrow-1000  . insulin aspart  0-15 Units Subcutaneous TID WC  . insulin aspart  0-5 Units Subcutaneous QHS  . Ipratropium-Albuterol  1 puff Inhalation TID  . lactobacillus  1 g Oral TID WC  . pantoprazole  40 mg Oral Daily  . QUEtiapine  25 mg Oral QHS   Continuous Infusions: . ampicillin-sulbactam (UNASYN) IV 3 g (03/08/20 1254)  . dextrose 5 % and 0.45% NaCl  50 mL/hr at 03/07/20 1623     LOS: 6 days     Huey Bienenstock, MD [facility]  If 7PM-7AM, please contact night-coverage  03/08/2020, 2:51 PM

## 2020-03-08 NOTE — Progress Notes (Signed)
EEG complete - results pending 

## 2020-03-08 NOTE — Progress Notes (Signed)
Pt still wearing mitts, CPAP not indicated at this time. No distress noted.

## 2020-03-08 NOTE — Progress Notes (Signed)
   03/07/20 2050  Assess: MEWS Score  Temp 98.1 F (36.7 C)  BP 104/73  Pulse Rate (!) 114  ECG Heart Rate (!) 115  Resp (!) 22  Level of Consciousness Alert  SpO2 92 %  O2 Device Room Air  Assess: MEWS Score  MEWS Temp 0  MEWS Systolic 0  MEWS Pulse 2  MEWS RR 1  MEWS LOC 0  MEWS Score 3  MEWS Score Color Yellow  Assess: if the MEWS score is Yellow or Red  Were vital signs taken at a resting state? Yes  Focused Assessment No change from prior assessment  Early Detection of Sepsis Score *See Row Information* Medium  MEWS guidelines implemented *See Row Information* Yes  Treat  MEWS Interventions Other (Comment) (no PRN's or scheduled meds)  Take Vital Signs  Increase Vital Sign Frequency  Yellow: Q 2hr X 2 then Q 4hr X 2, if remains yellow, continue Q 4hrs  Escalate  MEWS: Escalate Yellow: discuss with charge nurse/RN and consider discussing with provider and RRT  Notify: Charge Nurse/RN  Name of Charge Nurse/RN Notified Joni Reining  Date Charge Nurse/RN Notified 03/07/20  Time Charge Nurse/RN Notified 2053  Document  Patient Outcome Other (Comment) (patient stable and remained on floor)  Progress note created (see row info) Yes

## 2020-03-08 NOTE — Progress Notes (Signed)
   03/08/20 2043  Assess: MEWS Score  Temp 99.6 F (37.6 C)  BP 124/84  Pulse Rate (!) 115  ECG Heart Rate (!) 115  Resp 18  Level of Consciousness Alert  SpO2 97 %  O2 Device Room Air  O2 Flow Rate (L/min) 0 L/min  Assess: MEWS Score  MEWS Temp 0  MEWS Systolic 0  MEWS Pulse 2  MEWS RR 0  MEWS LOC 0  MEWS Score 2  MEWS Score Color Yellow  Assess: if the MEWS score is Yellow or Red  Were vital signs taken at a resting state? Yes  Focused Assessment No change from prior assessment  Early Detection of Sepsis Score *See Row Information* Medium  MEWS guidelines implemented *See Row Information* Yes  Treat  MEWS Interventions Other (Comment) (no PRNs for HR)  Take Vital Signs  Increase Vital Sign Frequency  Yellow: Q 2hr X 2 then Q 4hr X 2, if remains yellow, continue Q 4hrs  Escalate  MEWS: Escalate Yellow: discuss with charge nurse/RN and consider discussing with provider and RRT  Notify: Charge Nurse/RN  Name of Charge Nurse/RN Notified Nikki  Date Charge Nurse/RN Notified 03/08/20  Time Charge Nurse/RN Notified 2043  Document  Patient Outcome Other (Comment) (patient stable and remaining on floor)  Progress note created (see row info) Yes

## 2020-03-08 NOTE — Progress Notes (Signed)
PHARMACY NOTE:  ANTIMICROBIAL RENAL DOSAGE ADJUSTMENT  Current antimicrobial regimen includes a mismatch between antimicrobial dosage and estimated renal function.  As per policy approved by the Pharmacy & Therapeutics and Medical Executive Committees, the antimicrobial dosage will be adjusted accordingly.  Current antimicrobial dosage:  Unasyn 3gm IV q8h  Indication: Parotitis  Renal Function:  Estimated Creatinine Clearance: 61.1 mL/min (by C-G formula based on SCr of 1.18 mg/dL). []      On intermittent HD, scheduled: []      On CRRT    Antimicrobial dosage has been changed to:  Unasyn 3gm IV q6h  Additional comments: Scr downtrending, Will adjust.    Chenae Brager A. , PharmD, BCPS, FNKF Clinical Pharmacist [place] Please utilize Amion for appropriate phone number to reach the unit pharmacist [facility])   03/08/2020 8:12 AM

## 2020-03-08 NOTE — Consult Note (Signed)
WOC Nurse Consult Note: Patient receiving care in [phone]. Reason for Consult: "R shin wound requiring 2 drsg changes a shift d/t bleeding; Shaloub MD rec'd consulting WOC RN again" Wound type: bleeding wound to right shin Pressure Injury POA: Yes/No/NA Measurement: Wound bed: Drainage (amount, consistency, odor)  Periwound: Dressing procedure/placement/frequency: The dressing ordered by the [facility] nurse yesterday has not resulted in hemostasis, and needing to be changed twice each shift.  I have communicated to Dr.D. Elgergawy with information by Secure Chat that the WOC nurses do not manage excessively bleeding wounds, this belongs in the provider realm. If hemostasis cannot be achieved by typical pressure dressing approaches, consider a surgical consult. Helmut Muster, RN, MSN, CWOCN, CNS-BC, pager [phone]

## 2020-03-09 DIAGNOSIS — R4182 Altered mental status, unspecified: Secondary | ICD-10-CM

## 2020-03-09 DIAGNOSIS — R6520 Severe sepsis without septic shock: Secondary | ICD-10-CM | POA: Diagnosis not present

## 2020-03-09 DIAGNOSIS — Z86711 Personal history of pulmonary embolism: Secondary | ICD-10-CM

## 2020-03-09 DIAGNOSIS — A419 Sepsis, unspecified organism: Secondary | ICD-10-CM | POA: Diagnosis not present

## 2020-03-09 LAB — CBC
HCT: 26.8 % — ABNORMAL LOW (ref 39.0–52.0)
Hemoglobin: 8.3 g/dL — ABNORMAL LOW (ref 13.0–17.0)
MCH: 30.7 pg (ref 26.0–34.0)
MCHC: 31 g/dL (ref 30.0–36.0)
MCV: 99.3 fL (ref 80.0–100.0)
Platelets: 126 10*3/uL — ABNORMAL LOW (ref 150–400)
RBC: 2.7 MIL/uL — ABNORMAL LOW (ref 4.22–5.81)
RDW: 17.2 % — ABNORMAL HIGH (ref 11.5–15.5)
WBC: 7.8 10*3/uL (ref 4.0–10.5)
nRBC: 0 % (ref 0.0–0.2)

## 2020-03-09 LAB — BASIC METABOLIC PANEL
Anion gap: 7 (ref 5–15)
BUN: 15 mg/dL (ref 8–23)
CO2: 25 mmol/L (ref 22–32)
Calcium: 8 mg/dL — ABNORMAL LOW (ref 8.9–10.3)
Chloride: 109 mmol/L (ref 98–111)
Creatinine, Ser: 1.07 mg/dL (ref 0.61–1.24)
GFR, Estimated: >60 mL/min (ref >60)
Glucose, Bld: 128 mg/dL — ABNORMAL HIGH (ref 70–99)
Potassium: 3.4 mmol/L — ABNORMAL LOW (ref 3.5–5.1)
Sodium: 141 mmol/L (ref 135–145)

## 2020-03-09 LAB — LACTIC ACID, PLASMA: Lactic Acid, Venous: 2.1 mmol/L (ref 0.5–1.9)

## 2020-03-09 LAB — GLUCOSE, CAPILLARY
Glucose-Capillary: 99 mg/dL (ref 70–99)
Glucose-Capillary: 98 mg/dL (ref 70–99)
Glucose-Capillary: 167 mg/dL — ABNORMAL HIGH (ref 70–99)
Glucose-Capillary: 82 mg/dL (ref 70–99)

## 2020-03-09 LAB — PROCALCITONIN: Procalcitonin: <0.1 ng/mL

## 2020-03-09 LAB — RPR: RPR Ser Ql: NONREACTIVE

## 2020-03-09 MED ORDER — FOLIC ACID 1 MG PO TABS
1.0000 mg | ORAL_TABLET | Freq: Every day | ORAL | Status: DC
  Administered 2020-03-09 – 2020-03-10 (×2): 1 mg via ORAL
  Filled 2020-03-09 (×2): qty 1

## 2020-03-09 MED ORDER — QUETIAPINE FUMARATE 25 MG PO TABS
25.0000 mg | ORAL_TABLET | Freq: Every day | ORAL | Status: DC
  Administered 2020-03-09 – 2020-03-14 (×6): 25 mg via ORAL
  Filled 2020-03-09 (×7): qty 1

## 2020-03-09 MED ORDER — POTASSIUM CHLORIDE CRYS ER 20 MEQ PO TBCR
40.0000 meq | EXTENDED_RELEASE_TABLET | Freq: Once | ORAL | Status: AC
  Administered 2020-03-09: 40 meq via ORAL
  Filled 2020-03-09: qty 2

## 2020-03-09 NOTE — Progress Notes (Addendum)
PROGRESS NOTE    Chad Duke  CZY:[id number] DOB: Jul 11, 1945 DOA: 03/02/2020 PCP: Soundra Pilon, FNP   Brief Narrative:  75 year old residing at [facility] health care, chronic diastolic CHF, HTN, AAA, PE, rheumatoid arthritis, hypogonadism, depression, COPD on chronic 2 L nasal cannula, OSA, HLD was sent to the hospital for poor oral intake and left-sided facial swelling concerning for parotitis.  Patient was diagnosed of COVID 2 weeks ago and treated at the facility.  Seen by ENT in the ER started on Decadron; warm compresses and Unasyn.  Seen by speech therapist, started on dysphagia one diet.  PT is recommending SNF, TOC team aware.  Subjective:  Patient had fever 100.4 overnight, he himself denies any complaints, no significant events as discussed with staff.    Assessment & Plan:   Principal Problem:   Severe sepsis (HCC) Active Problems:   COPD GOLD III    Sleep apnea   Rheumatoid arthritis (HCC)   Morbid obesity due to excess calories (HCC)   History of pulmonary embolism   Acute renal failure superimposed on stage 3 chronic kidney disease (HCC)   Controlled type 2 diabetes mellitus with hyperglycemia (HCC)   Parotitis  Severe sepsis secondary to left parotitis - Sepsis physiology is improving.  Warm compresses.  Aggressive massage -Consult greatly appreciated, recommendation for antibiotic with Unasyn, Decadron x3 doses . -Continue with warm compresses and parotid gland massage, which staff has been doing . - Pain control -CT cervical spine-anterior dislocation of left mandibular condyle with soft tissue swelling -Discussed with staff to continue warm compress, and aggressive massage -Patient with low-grade temperature 100.4, I will  obtain lactic acid, procalcitonin and CBC as well  Acute metabolic encephalopathy -Remains persistent problem, CT head with no acute findings MRI has been obtained as well with no acute finding, no evidence of ischemic event,. -EEG with  encephalopathy. -B12, TSH, ammonia within normal limits.  Nonreactive -Folic acid acid on the lower side at 4.1, started on supplement. -Thought to be due to infection, and some hospital delirium, started on Seroquel, he appears more sleepy today after Seroquel, so I will give the dose earlier this evening at 8 PM.   Hypomagnesemia -Repleted  Thrombocytopenia -Mild, continue to monitor.    Acute kidney injury on CKD stage IIIa, prerenal - Baseline creatinine 1.8.  Admission creatinine 3.22.  - resolved - Lasix on hold  Dysphagia likely from peritonitis -Patient seen by speech yesterday.  Doing slightly better.  Currently on dysphagia one diet.  Continue Accu-Cheks.  Hypokalemia - repleted  hypernatremia -Continue D5 half-normal saline and aggressive potassium repletion.    Recent COVID-19 infection, asymptomatic -Chest x-ray negative for any active infiltrates.  Currently on 2 L nasal cannula  Chronic hypoxia secondary to COPD - Home uses 2-3 L nasal cannula. - Continue bronchodilators, already on steroids  History of pulmonary embolism - On home Eliquis , currently due to mentation, mentation appears to be improving, will resume back on Eliquis.  Diabetes mellitus type 2 - Glucophage on hold -Accu-Cheks and sliding scale  History of gout - Continue allopurinol  Right lower extremity wound -Wound care consulted -Patient had small amount of oozing from the wound, resolved after holding Eliquis for 24 hours.  History of RA -On Remicade as an outpatient  Foley catheter discontinued 1/26, no evidence of retention.  DVT prophylaxis: Lovenox Code Status: DNR Family Communication: D/W with his niece Courtney Heys on 1/29 Status is: Inpatient  Remains inpatient appropriate because:IV treatments appropriate due to  intensity of illness or inability to take PO   Dispo: The patient is from: Home              Anticipated d/c is to: SNF              Anticipated d/c date  is: 2 days              Patient currently is not medically stable to d/c.  Maintain hospital stay until his p.o. intake is not consistent, he requires IV antibiotics.  In the meantime awaiting his mental status to improve   Difficult to place patient No   Body mass index is 32.58 kg/m.  Pressure Injury 03/06/20 Heel Left Deep Tissue Pressure Injury - Purple or maroon localized area of discolored intact skin or blood-filled blister due to damage of underlying soft tissue from pressure and/or shear. (Active)  03/06/20 1932  Location: Heel  Location Orientation: Left  Staging: Deep Tissue Pressure Injury - Purple or maroon localized area of discolored intact skin or blood-filled blister due to damage of underlying soft tissue from pressure and/or shear.  Wound Description (Comments):   Present on Admission:      Examination:  Awake, frail, restless today, remains confused  Symmetrical Chest wall movement, Good air movement bilaterally, CTAB RRR,No Gallops,Rubs or new Murmurs, No Parasternal Heave +ve B.Sounds, Abd Soft, No tenderness, No rebound - guarding or rigidity. No Cyanosis, Clubbing or edema, No new Rash or bruise       Objective: Vitals:   03/09/20 0805 03/09/20 0913 03/09/20 1001 03/09/20 1243  BP: (!) 130/94  (!) 93/55 (!) 118/50  Pulse: (!) 111  (!) 106 (!) 104  Resp: (!) 23  20 18   Temp: 98 F (36.7 C) 98.2 F (36.8 C) 97.7 F (36.5 C) 98.2 F (36.8 C)  TempSrc: Oral  Axillary Oral  SpO2: 97%  96% 97%  Weight:      Height:        Intake/Output Summary (Last 24 hours) at 03/09/2020 1421 Last data filed at 03/09/2020 1346 Gross per 24 hour  Intake 1607.44 ml  Output 400 ml  Net 1207.44 ml   Filed Weights   03/02/20 1146 03/04/20 0318  Weight: 99.8 kg 97.2 kg     Data Reviewed:   CBC: Recent Labs  Lab 03/04/20 0131 03/05/20 0037 03/06/20 0107 03/07/20 0138 03/08/20 0140  WBC 13.9* 10.1 8.5 7.5 5.9  HGB 10.3* 10.6* 10.2* 10.3* 9.7*  HCT 33.3*  34.0* 32.2* 32.3* 30.7*  MCV 99.7 99.1 99.1 96.7 98.4  PLT 125* 119* 108* 93* 102*   Basic Metabolic Panel: Recent Labs  Lab 03/04/20 0131 03/04/20 1228 03/05/20 0037 03/06/20 0107 03/07/20 0138 03/08/20 0140 03/09/20 0432  NA 149*   < > 150* 150* 147* 145 141  K 2.8*   < > 3.1* 3.7 3.8 3.1* 3.4*  CL 114*   < > 116* 117* 113* 110 109  CO2 23   < > 24 24 25 24 25   GLUCOSE 94   < > 119* 66* 113* 127* 128*  BUN 48*   < > 47* 34* 23 20 15   CREATININE 1.96*   < > 1.68* 1.28* 1.30* 1.18 1.07  CALCIUM 8.5*   < > 8.8* 8.9 8.3* 7.9* 8.0*  MG 1.7  --  2.1 1.8 1.6* 2.0  --    < > = values in this interval not displayed.   GFR: Estimated Creatinine Clearance: 67.4 mL/min (by C-G formula based  on SCr of 1.07 mg/dL). Liver Function Tests: Recent Labs  Lab 03/02/20 1820  AST 25  ALT 21  ALKPHOS 81  BILITOT 1.4*  PROT 5.7*  ALBUMIN 1.9*   No results for input(s): LIPASE, AMYLASE in the last 168 hours. Recent Labs  Lab 03/08/20 1619  AMMONIA 34   Coagulation Profile: No results for input(s): INR, PROTIME in the last 168 hours. Cardiac Enzymes: No results for input(s): CKTOTAL, CKMB, CKMBINDEX, TROPONINI in the last 168 hours. BNP (last 3 results) No results for input(s): PROBNP in the last 8760 hours. HbA1C: No results for input(s): HGBA1C in the last 72 hours. CBG: Recent Labs  Lab 03/08/20 1147 03/08/20 1726 03/08/20 2107 03/09/20 0804 03/09/20 1244  GLUCAP 125* 185* 101* 99 98   Lipid Profile: No results for input(s): CHOL, HDL, LDLCALC, TRIG, CHOLHDL, LDLDIRECT in the last 72 hours. Thyroid Function Tests: Recent Labs    03/08/20 1550  TSH 2.478   Anemia Panel: Recent Labs    03/08/20 1550  VITAMINB12 618  FOLATE 4.1*   Sepsis Labs: Recent Labs  Lab 03/02/20 1639 03/04/20 0131  PROCALCITON  --  0.64  LATICACIDVEN 1.7  --     Recent Results (from the past 240 hour(s))  Culture, blood (routine x 2)     Status: None   Collection Time: 03/02/20  11:43 AM   Specimen: BLOOD  Result Value Ref Range Status   Specimen Description BLOOD RIGHT ANTECUBITAL  Final   Special Requests   Final    BOTTLES DRAWN AEROBIC AND ANAEROBIC Blood Culture adequate volume   Culture   Final    NO GROWTH 5 DAYS Performed at [facility], [address] [place], [place] [address]    Report Status 03/07/2020 FINAL  Final  Urine culture     Status: None   Collection Time: 03/02/20 11:43 AM   Specimen: Urine, Random  Result Value Ref Range Status   Specimen Description URINE, RANDOM  Final   Special Requests NONE  Final   Culture   Final    NO GROWTH Performed at [facility], [address] [place], [place] [address]    Report Status 03/03/2020 FINAL  Final  SARS Coronavirus 2 by RT PCR (hospital order, performed in [facility] hospital lab) Nasopharyngeal Nasopharyngeal Swab     Status: Abnormal   Collection Time: 03/02/20 11:46 AM   Specimen: Nasopharyngeal Swab  Result Value Ref Range Status   SARS Coronavirus 2 POSITIVE (A) NEGATIVE Final    Comment: RESULT CALLED TO, READ BACK BY AND VERIFIED WITH: RN Colonel Bald [phone] AT 1405 BY CM (NOTE) SARS-CoV-2 target nucleic acids are DETECTED  SARS-CoV-2 RNA is generally detectable in upper respiratory specimens  during the acute phase of infection.  Positive results are indicative  of the presence of the identified virus, but do not rule out bacterial infection or co-infection with other pathogens not detected by the test.  Clinical correlation with patient history and  other diagnostic information is necessary to determine patient infection status.  The expected result is negative.  Fact Sheet for Patients:   BoilerBrush.com.cy   Fact Sheet for Healthcare Providers:   https://pope.com/    This test is not yet approved or cleared by the [place] FDA and  has been authorized for detection and/or diagnosis of SARS-CoV-2 by FDA  under an Emergency Use Authorization (EUA).  This EUA will remain in effect (meaning this test c an be used)  for the duration of  the COVID-19 declaration under Section 564(b)(1) of the Act, 21 U.S.C. section 360-bbb-3(b)(1), unless the authorization is terminated or revoked sooner.  Performed at [facility], [address] [place], [place] [address]   Culture, blood (routine x 2)     Status: None   Collection Time: 03/03/20  4:22 AM   Specimen: BLOOD  Result Value Ref Range Status   Specimen Description BLOOD SITE NOT SPECIFIED  Final   Special Requests   Final    BOTTLES DRAWN AEROBIC ONLY Blood Culture results may not be optimal due to an inadequate volume of blood received in culture bottles   Culture   Final    NO GROWTH 5 DAYS Performed at [facility], [address], [place], [place] [address]    Report Status 03/08/2020 FINAL  Final  MRSA PCR Screening     Status: Abnormal   Collection Time: 03/03/20  8:47 PM   Specimen: Nasopharyngeal  Result Value Ref Range Status   MRSA by PCR POSITIVE (A) NEGATIVE Final    Comment:        The GeneXpert MRSA Assay (FDA approved for NASAL specimens only), is one component of a comprehensive MRSA colonization surveillance program. It is not intended to diagnose MRSA infection nor to guide or monitor treatment for MRSA infections. RESULT CALLED TO, READ BACK BY AND VERIFIED WITH: [facility] RN 2249 03/03/2020 MITCHELL,L Performed at [facility], [address], [place], [place] [address]   MRSA PCR Screening     Status: Abnormal   Collection Time: 03/08/20  8:47 AM  Result Value Ref Range Status   MRSA by PCR POSITIVE (A) NEGATIVE Final    Comment:        The GeneXpert MRSA Assay (FDA approved for NASAL specimens only), is one component of a comprehensive MRSA colonization surveillance program. It is not intended to diagnose MRSA infection nor to guide or monitor treatment for MRSA infections. RESULT  CALLED TO, READ BACK BY AND VERIFIED WITH: Timoteo Expose RN 12:40 03/08/20 ([email or web address]) Performed at [facility], [address] [place], [place] [address]          Radiology Studies: EEG adult  Result Date: 2020/03/21 Charlsie Quest, MD     03-21-20  9:04 AM Patient Name: Chad Duke MRN: [id number] Epilepsy Attending: Charlsie Quest Referring Physician/Provider: Dr Huey Bienenstock Date: 03/08/2020 Duration: 22.24 mins Patient history: 75yo M with ams, EEG to evaluate for seizure Level of alertness: Awake AEDs during EEG study: None Technical aspects: This EEG study was done with scalp electrodes positioned according to the 10-20 International system of electrode placement. Electrical activity was acquired at a sampling rate of  and reviewed with a high frequency filter of  and a low frequency filter of . EEG data were recorded continuously and digitally stored. Description: No posterior dominant rhythm was seen. EEG showed continuous generalized 5 to 6 Hz theta as well as intermittent 2-3hz  delta slowing.  Hyperventilation and photic stimulation were not performed.   ABNORMALITY -Continuous slow, generalized IMPRESSION: This study is suggestive of mild to moderate diffuse encephalopathy, nonspecific etiology. No seizures or epileptiform discharges were seen throughout the recording. Priyanka Annabelle Harman        Scheduled Meds: . apixaban  5 mg Oral BID  . budesonide  1 puff Inhalation BID  . feeding supplement  237 mL Oral BID BM  . folic acid  1 mg Oral  Daily  . influenza vaccine adjuvanted  0.5 mL Intramuscular Tomorrow-1000  . insulin aspart  0-15 Units Subcutaneous TID WC  . insulin aspart  0-5 Units Subcutaneous QHS  . Ipratropium-Albuterol  1 puff Inhalation TID  . lactobacillus  1 g Oral TID WC  . pantoprazole  40 mg Oral Daily  . QUEtiapine  25 mg Oral QHS   Continuous Infusions: . ampicillin-sulbactam (UNASYN) IV 3 g (03/09/20 1313)     LOS: 7 days      Huey Bienenstock, MD [facility]  If 7PM-7AM, please contact night-coverage  03/09/2020, 2:21 PM

## 2020-03-09 NOTE — Progress Notes (Signed)
MEWS fired yellow @ 805 due to increased HR and RR. Patient is confused and restless. No acute changes noted. Will continue to monitor patient.

## 2020-03-09 NOTE — Progress Notes (Signed)
Patient unable to wear CPAP due to mitts.

## 2020-03-09 NOTE — Procedures (Signed)
Patient Name: Chad Duke  MRN: [id number]  Epilepsy Attending: Charlsie Quest  Referring Physician/Provider: Dr Huey Bienenstock Date: 03/08/2020 Duration: 22.24 mins  Patient history: 75yo M with ams, EEG to evaluate for seizure  Level of alertness: Awake  AEDs during EEG study: None  Technical aspects: This EEG study was done with scalp electrodes positioned according to the 10-20 International system of electrode placement. Electrical activity was acquired at a sampling rate of 500Hz  and reviewed with a high frequency filter of 70Hz  and a low frequency filter of 1Hz . EEG data were recorded continuously and digitally stored.   Description: No posterior dominant rhythm was seen.  EEG showed continuous generalized 5 to 6 Hz theta as well as intermittent 2-3hz  delta slowing.  Hyperventilation and photic stimulation were not performed.     ABNORMALITY -Continuous slow, generalized  IMPRESSION: This study is suggestive of mild to moderate diffuse encephalopathy, nonspecific etiology. No seizures or epileptiform discharges were seen throughout the recording.  Chad Duke 

## 2020-03-10 DIAGNOSIS — R6520 Severe sepsis without septic shock: Secondary | ICD-10-CM | POA: Diagnosis not present

## 2020-03-10 DIAGNOSIS — R4182 Altered mental status, unspecified: Secondary | ICD-10-CM | POA: Diagnosis not present

## 2020-03-10 DIAGNOSIS — A419 Sepsis, unspecified organism: Secondary | ICD-10-CM | POA: Diagnosis not present

## 2020-03-10 DIAGNOSIS — N179 Acute kidney failure, unspecified: Secondary | ICD-10-CM | POA: Diagnosis not present

## 2020-03-10 LAB — COMPREHENSIVE METABOLIC PANEL
ALT: 62 U/L — ABNORMAL HIGH (ref 0–44)
AST: 45 U/L — ABNORMAL HIGH (ref 15–41)
Albumin: 1.6 g/dL — ABNORMAL LOW (ref 3.5–5.0)
Alkaline Phosphatase: 105 U/L (ref 38–126)
Anion gap: 7 (ref 5–15)
BUN: 15 mg/dL (ref 8–23)
CO2: 25 mmol/L (ref 22–32)
Calcium: 8.1 mg/dL — ABNORMAL LOW (ref 8.9–10.3)
Chloride: 109 mmol/L (ref 98–111)
Creatinine, Ser: 1.09 mg/dL (ref 0.61–1.24)
GFR, Estimated: >60 mL/min (ref >60)
Glucose, Bld: 104 mg/dL — ABNORMAL HIGH (ref 70–99)
Potassium: 3.8 mmol/L (ref 3.5–5.1)
Sodium: 141 mmol/L (ref 135–145)
Total Bilirubin: 0.7 mg/dL (ref 0.3–1.2)
Total Protein: 5 g/dL — ABNORMAL LOW (ref 6.5–8.1)

## 2020-03-10 LAB — CBC
HCT: 24.9 % — ABNORMAL LOW (ref 39.0–52.0)
Hemoglobin: 7.8 g/dL — ABNORMAL LOW (ref 13.0–17.0)
MCH: 30.7 pg (ref 26.0–34.0)
MCHC: 31.3 g/dL (ref 30.0–36.0)
MCV: 98 fL (ref 80.0–100.0)
Platelets: 123 10*3/uL — ABNORMAL LOW (ref 150–400)
RBC: 2.54 MIL/uL — ABNORMAL LOW (ref 4.22–5.81)
RDW: 17.5 % — ABNORMAL HIGH (ref 11.5–15.5)
WBC: 6.8 10*3/uL (ref 4.0–10.5)
nRBC: 0 % (ref 0.0–0.2)

## 2020-03-10 LAB — PROCALCITONIN: Procalcitonin: <0.1 ng/mL

## 2020-03-10 LAB — GLUCOSE, CAPILLARY
Glucose-Capillary: 93 mg/dL (ref 70–99)
Glucose-Capillary: 101 mg/dL — ABNORMAL HIGH (ref 70–99)
Glucose-Capillary: 93 mg/dL (ref 70–99)
Glucose-Capillary: 139 mg/dL — ABNORMAL HIGH (ref 70–99)

## 2020-03-10 MED ORDER — FOLIC ACID 1 MG PO TABS
2.0000 mg | ORAL_TABLET | Freq: Every day | ORAL | Status: DC
  Administered 2020-03-11 – 2020-03-15 (×5): 2 mg via ORAL
  Filled 2020-03-10 (×5): qty 2

## 2020-03-10 MED ORDER — POLYETHYLENE GLYCOL 3350 17 G PO PACK
34.0000 g | PACK | Freq: Once | ORAL | Status: AC
  Administered 2020-03-10: 34 g via ORAL
  Filled 2020-03-10: qty 2

## 2020-03-10 NOTE — Progress Notes (Signed)
PROGRESS NOTE    Chad Duke  ZOX:[id number] DOB: Jan 29, 1946 DOA: 03/02/2020 PCP: Soundra Pilon, FNP   Brief Narrative:  75 year old residing at [facility] health care, chronic diastolic CHF, HTN, AAA, PE, rheumatoid arthritis, hypogonadism, depression, COPD on chronic 2 L nasal cannula, OSA, HLD was sent to the hospital for poor oral intake and left-sided facial swelling concerning for parotitis.  Patient was diagnosed of COVID 2 weeks ago and treated at the facility.  Seen by ENT in the ER started on Decadron; warm compresses and Unasyn.  Seen by speech therapist, started on dysphagia one diet.  PT is recommending SNF, TOC team aware.  Subjective:  No significant events overnight as discussed with staff, patient himself denies any complaints  Assessment & Plan:   Principal Problem:   Severe sepsis (HCC) Active Problems:   COPD GOLD III    Sleep apnea   Rheumatoid arthritis (HCC)   Morbid obesity due to excess calories (HCC)   History of pulmonary embolism   Acute renal failure superimposed on stage 3 chronic kidney disease (HCC)   Controlled type 2 diabetes mellitus with hyperglycemia (HCC)   Parotitis  Severe sepsis secondary to left parotitis - Sepsis physiology is improving.  Warm compresses.  Aggressive massage -Consult greatly appreciated, recommendation for antibiotic with Unasyn, Decadron x3 doses . -Continue with warm compresses and parotid gland massage, which staff has been doing . - Pain control -CT cervical spine-anterior dislocation of left mandibular condyle with soft tissue swelling -Discussed with staff to continue warm compress, and aggressive massage -Patient is afebrile, procalcitonin is reassuring.  Acute metabolic encephalopathy -Remains persistent problem, CT head with no acute findings MRI has been obtained as well with no acute finding, no evidence of ischemic event,. -EEG with encephalopathy. -B12, TSH, ammonia within normal limits.  RPR  nonreactive -Folic acid acid on the lower side at 4.1, started on supplement. -Thought to be due to infection initially, but this has significantly improved, without improvement of mentation, hospital delirium may be contributing, he is started on low-dose Seroquel. - I Have requested neurology input regarding further recommendation. -Will try to ambulate patient more, will request telemetry sitter so we can keep him in a chair.  Anemia -Globin slowly trending down, his baseline is around 10, globin was elevated on admission most likely in the setting of volume depletion, no evidence of significant GI bleed, he had normal color bowel movement yesterday, will obtain Hemoccult, he had very minimal oozing at the right lower extremity wound which would not explaidrop in his hemoglobin, I will obtain anemia panel and Hemoccult .  Hypomagnesemia -Repleted  Thrombocytopenia -Mild, continue to monitor.    Acute kidney injury on CKD stage IIIa, prerenal - Baseline creatinine 1.8.  Admission creatinine 3.22.  - resolved - Lasix on hold  Dysphagia likely from peritonitis -Tolerating p.o. intake  Hypokalemia - repleted  hypernatremia -Continue D5 half-normal saline and aggressive potassium repletion.    Recent COVID-19 infection, asymptomatic -Chest x-ray negative for any active infiltrates.  Currently on 2 L nasal cannula  Chronic hypoxia secondary to COPD - Home uses 2-3 L nasal cannula. - Continue bronchodilators, already on steroids  History of pulmonary embolism - On home Eliquis , currently due to mentation, mentation appears to be improving, will resume back on Eliquis.  Diabetes mellitus type 2 - Glucophage on hold -Accu-Cheks and sliding scale  History of gout - Continue allopurinol  Right lower extremity wound -Wound care consulted -Patient had small amount of oozing  from the wound, resolved after holding Eliquis for 24 hours.  History of RA -On Remicade as an  outpatient  Foley catheter discontinued 1/26, no evidence of retention.  DVT prophylaxis: Lovenox Code Status: DNR Family Communication: D/W with his niece Courtney Heys on 1/28 Status is: Inpatient  Remains inpatient appropriate because:IV treatments appropriate due to intensity of illness or inability to take PO   Dispo: The patient is from: Home              Anticipated d/c is to: SNF              Anticipated d/c date is: 2 days              Patient currently is not medically stable to d/c.  Remains significantly altered.     Difficult to place patient No   Body mass index is 32.58 kg/m.  Pressure Injury 03/06/20 Heel Left Deep Tissue Pressure Injury - Purple or maroon localized area of discolored intact skin or blood-filled blister due to damage of underlying soft tissue from pressure and/or shear. (Active)  03/06/20 1932  Location: Heel  Location Orientation: Left  Staging: Deep Tissue Pressure Injury - Purple or maroon localized area of discolored intact skin or blood-filled blister due to damage of underlying soft tissue from pressure and/or shear.  Wound Description (Comments):   Present on Admission:      Examination:  Awake, extremely frail, remains confused, with poor cognition and insight  Left parotid gland swelling significantly subsided. Symmetrical Chest wall movement, Good air movement bilaterally, CTAB RRR,No Gallops,Rubs or new Murmurs, No Parasternal Heave +ve B.Sounds, Abd Soft, No tenderness, No rebound - guarding or rigidity. No Cyanosis, Clubbing or edema, No new Rash or bruise       Objective: Vitals:   03/10/20 0304 03/10/20 0753 03/10/20 0958 03/10/20 1233  BP: 115/75 121/85 113/81 119/79  Pulse: (!) 103 (!) 108 (!) 104 98  Resp: 19 (!) Temp: 98.1 F (36.7 C) 98.5 F (36.9 C) 98.3 F (36.8 C) 98.1 F (36.7 C)  TempSrc: Axillary Axillary Axillary Axillary  SpO2: 97% 100% 97% 97%  Weight:      Height:        Intake/Output  Summary (Last 24 hours) at 03/10/2020 1340 Last data filed at 03/10/2020 0854 Gross per 24 hour  Intake 1150 ml  Output 250 ml  Net 900 ml   Filed Weights   03/02/20 1146 03/04/20 0318  Weight: 99.8 kg 97.2 kg     Data Reviewed:   CBC: Recent Labs  Lab 03/06/20 0107 03/07/20 0138 03/08/20 0140 03/09/20 1725 03/10/20 0318  WBC 8.5 7.5 5.9 7.8 6.8  HGB 10.2* 10.3* 9.7* 8.3* 7.8*  HCT 32.2* 32.3* 30.7* 26.8* 24.9*  MCV 99.1 96.7 98.4 99.3 98.0  PLT 108* 93* 102* 126* 123*   Basic Metabolic Panel: Recent Labs  Lab 03/04/20 0131 03/04/20 1228 03/05/20 0037 03/06/20 0107 03/07/20 0138 03/08/20 0140 03/09/20 0432 03/10/20 0318  NA 149*   < > 150* 150* 147* 145 141 141  K 2.8*   < > 3.1* 3.7 3.8 3.1* 3.4* 3.8  CL 114*   < > 116* 117* 113* 110 109 109  CO2 23   < > GLUCOSE 94   < > 119* 66* 113* 127* 128* 104*  BUN 48*   < > 47* 34* CREATININE 1.96*   < >  1.68* 1.28* 1.30* 1.18 1.07 1.09  CALCIUM 8.5*   < > 8.8* 8.9 8.3* 7.9* 8.0* 8.1*  MG 1.7  --  2.1 1.8 1.6* 2.0  --   --    < > = values in this interval not displayed.   GFR: Estimated Creatinine Clearance: 66.2 mL/min (by C-G formula based on SCr of 1.09 mg/dL). Liver Function Tests: Recent Labs  Lab 03/10/20 0318  AST 45*  ALT 62*  ALKPHOS 105  BILITOT 0.7  PROT 5.0*  ALBUMIN 1.6*   No results for input(s): LIPASE, AMYLASE in the last 168 hours. Recent Labs  Lab 03/08/20 1619  AMMONIA 34   Coagulation Profile: No results for input(s): INR, PROTIME in the last 168 hours. Cardiac Enzymes: No results for input(s): CKTOTAL, CKMB, CKMBINDEX, TROPONINI in the last 168 hours. BNP (last 3 results) No results for input(s): PROBNP in the last 8760 hours. HbA1C: No results for input(s): HGBA1C in the last 72 hours. CBG: Recent Labs  Lab 03/09/20 1244 03/09/20 1716 03/09/20 2002 03/10/20 0755 03/10/20 1236  GLUCAP 98 167* 82 93 101*   Lipid Profile: No results for  input(s): CHOL, HDL, LDLCALC, TRIG, CHOLHDL, LDLDIRECT in the last 72 hours. Thyroid Function Tests: Recent Labs    03/08/20 1550  TSH 2.478   Anemia Panel: Recent Labs    03/08/20 1550  VITAMINB12 618  FOLATE 4.1*   Sepsis Labs: Recent Labs  Lab 03/04/20 0131 03/09/20 1725 03/10/20 0318  PROCALCITON 0.64 <0.10 <0.10  LATICACIDVEN  --  2.1*  --     Recent Results (from the past 240 hour(s))  Culture, blood (routine x 2)     Status: None   Collection Time: 03/02/20 11:43 AM   Specimen: BLOOD  Result Value Ref Range Status   Specimen Description BLOOD RIGHT ANTECUBITAL  Final   Special Requests   Final    BOTTLES DRAWN AEROBIC AND ANAEROBIC Blood Culture adequate volume   Culture   Final    NO GROWTH 5 DAYS Performed at [facility], [address], [place], [place] [address]    Report Status 03/07/2020 FINAL  Final  Urine culture     Status: None   Collection Time: 03/02/20 11:43 AM   Specimen: Urine, Random  Result Value Ref Range Status   Specimen Description URINE, RANDOM  Final   Special Requests NONE  Final   Culture   Final    NO GROWTH Performed at [facility], [address] [place], [place] [address]    Report Status 03/03/2020 FINAL  Final  SARS Coronavirus 2 by RT PCR (hospital order, performed in [facility] hospital lab) Nasopharyngeal Nasopharyngeal Swab     Status: Abnormal   Collection Time: 03/02/20 11:46 AM   Specimen: Nasopharyngeal Swab  Result Value Ref Range Status   SARS Coronavirus 2 POSITIVE (A) NEGATIVE Final    Comment: RESULT CALLED TO, READ BACK BY AND VERIFIED WITH: RN Colonel Bald [phone] AT 1405 BY CM (NOTE) SARS-CoV-2 target nucleic acids are DETECTED  SARS-CoV-2 RNA is generally detectable in upper respiratory specimens  during the acute phase of infection.  Positive results are indicative  of the presence of the identified virus, but do not rule out bacterial infection or co-infection with other pathogens  not detected by the test.  Clinical correlation with patient history and  other diagnostic information is necessary to determine patient infection status.  The expected result is negative.  Fact Sheet for Patients:  BoilerBrush.com.cy   Fact Sheet for Healthcare Providers:   https://pope.com/    This test is not yet approved or cleared by the [place] FDA and  has been authorized for detection and/or diagnosis of SARS-CoV-2 by FDA under an Emergency Use Authorization (EUA).  This EUA will remain in effect (meaning this test c an be used) for the duration of  the COVID-19 declaration under Section 564(b)(1) of the Act, 21 U.S.C. section 360-bbb-3(b)(1), unless the authorization is terminated or revoked sooner.  Performed at [facility], [address] [place], [place] [address]   Culture, blood (routine x 2)     Status: None   Collection Time: 03/03/20  4:22 AM   Specimen: BLOOD  Result Value Ref Range Status   Specimen Description BLOOD SITE NOT SPECIFIED  Final   Special Requests   Final    BOTTLES DRAWN AEROBIC ONLY Blood Culture results may not be optimal due to an inadequate volume of blood received in culture bottles   Culture   Final    NO GROWTH 5 DAYS Performed at [facility], [address] [place], [place] [address]    Report Status 03/08/2020 FINAL  Final  MRSA PCR Screening     Status: Abnormal   Collection Time: 03/03/20  8:47 PM   Specimen: Nasopharyngeal  Result Value Ref Range Status   MRSA by PCR POSITIVE (A) NEGATIVE Final    Comment:        The GeneXpert MRSA Assay (FDA approved for NASAL specimens only), is one component of a comprehensive MRSA colonization surveillance program. It is not intended to diagnose MRSA infection nor to guide or monitor treatment for MRSA infections. RESULT CALLED TO, READ BACK BY AND VERIFIED WITH: [facility] RN [phone] 03/03/2020 MITCHELL,L Performed at  [facility], [address] [place], [place] [address]   MRSA PCR Screening     Status: Abnormal   Collection Time: 03/08/20  8:47 AM  Result Value Ref Range Status   MRSA by PCR POSITIVE (A) NEGATIVE Final    Comment:        The GeneXpert MRSA Assay (FDA approved for NASAL specimens only), is one component of a comprehensive MRSA colonization surveillance program. It is not intended to diagnose MRSA infection nor to guide or monitor treatment for MRSA infections. RESULT CALLED TO, READ BACK BY AND VERIFIED WITH: Timoteo Expose RN 12:40 03/08/20 ([email or web address]) Performed at [facility], [address], [place], [place] [address]          Radiology Studies: EEG adult  Result Date: 03/20/2020 Charlsie Quest, MD     03-20-2020  9:04 AM Patient Name: Chad Duke MRN: [id number] Epilepsy Attending: Charlsie Quest Referring Physician/Provider: Dr Huey Bienenstock Date: 03/08/2020 Duration: 22.24 mins Patient history: 75yo M with ams, EEG to evaluate for seizure Level of alertness: Awake AEDs during EEG study: None Technical aspects: This EEG study was done with scalp electrodes positioned according to the 10-20 International system of electrode placement. Electrical activity was acquired at a sampling rate of 500Hz  and reviewed with a high frequency filter of 70Hz  and a low frequency filter of 1Hz . EEG data were recorded continuously and digitally stored. Description: No posterior dominant rhythm was seen. EEG showed continuous generalized 5 to 6 Hz theta as well as intermittent 2-3hz  delta slowing.  Hyperventilation and photic stimulation were not performed.   ABNORMALITY -Continuous slow, generalized IMPRESSION: This study is suggestive of mild to  moderate diffuse encephalopathy, nonspecific etiology. No seizures or epileptiform discharges were seen throughout the recording. Priyanka Annabelle Harman        Scheduled Meds: . apixaban  5 mg Oral BID  . budesonide  1 puff Inhalation  BID  . feeding supplement  237 mL Oral BID BM  . folic acid  1 mg Oral Daily  . influenza vaccine adjuvanted  0.5 mL Intramuscular Tomorrow-1000  . insulin aspart  0-15 Units Subcutaneous TID WC  . insulin aspart  0-5 Units Subcutaneous QHS  . Ipratropium-Albuterol  1 puff Inhalation TID  . lactobacillus  1 g Oral TID WC  . pantoprazole  40 mg Oral Daily  . polyethylene glycol  34 g Oral Once  . QUEtiapine  25 mg Oral QHS   Continuous Infusions: . ampicillin-sulbactam (UNASYN) IV 3 g (03/10/20 1610)     LOS: 8 days     Huey Bienenstock, MD [facility]  If 7PM-7AM, please contact night-coverage  03/10/2020, 1:40 PM

## 2020-03-10 NOTE — Consult Note (Signed)
NEURO HOSPITALIST CONSULT NOTE   Requestig physician: Dr. Randol Kern  Reason for Consult: Confusion  History obtained from:   Chart     HPI:                                                                                                                                          Chad Duke is an 75 y.o. male with a PMHx of CHF, AAA, HTN, HLD, PE, RA, hypogonadism, depression, nephrolithiasis, COPD and OSA who was sent to [facility] on 1/22 from his SNF for confusion and progressively poor oral intake. In the ED he was diagnosed with severe sepsis secondary to left parotitis. He was started on antibiotics and Decadron. He was lethargic and nonverbal in the ED and was diagnosed with an acute metabolic encephalopathy due to acute infection in the setting of AKI on CKD3 with dehydration. Of note regarding his AKI, his admission creatinine was 3.22 relative to a baseline of 1.8. At that time he was unable to give a history. The patient had had Covid about 2 weeks prior to admission and had been treated at his facility.   His sepsis physiology has been improving per Hospitalist note from today. His encephalopathy has improved somewhat, as the patient is now verbal, but it remains a persistent problem. EEG on 1/29 showed Continuous generalized slowing, suggestive of a mild to moderate diffuse encephalopathy, nonspecific to etiology. CT head showed no acute findings and MRI also showed no acute finding with no evidence of ischemic event. His B12, TSH and ammonia were within normal limits, and RPR was nonreactive. He has been started on folate supplement for low folate level. Hospitalist feels that a hospital delirium may be contributing. The patient has been started on low-dose Seroquel. Per Hospitalist note, the plan is to try to ambulate patient more, will request telemetry sitter so that he can be moved to a chair during the day. Neurology was called for further recommendations.    Past  Medical History:  Diagnosis Date  . AAA (abdominal aortic aneurysm) (HCC)   . Allergy    Rhinitis  . Arthritis    Rhuematoid  . Asthma   . CHF (congestive heart failure) (HCC)    chronic diastolic (congestive) heart failure from notes from [facility] face sheet  . Chronic renal insufficiency   . Depression    per notes from [facility] on chart  . Diverticulosis   . Emphysema   . Falls frequently   . GERD (gastroesophageal reflux disease)   . Hearing loss of both ears   . Hiatal hernia   . Hyperlipidemia   . Hypertension   . Male hypogonadism   . Nephrolithiasis   . Nephrolithiasis   . Sleep apnea    On CPAP  . Stasis  dermatitis   . Varicose veins   . Vitamin B 12 deficiency     Past Surgical History:  Procedure Laterality Date  . CARPAL TUNNEL RELEASE     bilateral  . CYSTOSCOPY/URETEROSCOPY/HOLMIUM LASER/STENT PLACEMENT Left 11/02/2018   Procedure: CYSTOSCOPY/URETEROSCOPY/HOLMIUM LASER/STENT PLACEMENT;  Surgeon: Crista Elliot, MD;  Location: [facility];  Service: Urology;  Laterality: Left;  . FRACTURE SURGERY Left    Fibula  . JOINT REPLACEMENT Left    Shoulder  . JOINT REPLACEMENT Left    Hip   . KIDNEY STONE SURGERY  2003  . left hip replacement    . left knee    . left shoulder    . LITHOTRIPSY      Family History  Problem Relation Age of Onset  . Emphysema Father   . Asthma Father   . Osteoarthritis Father   . Heart attack Mother   . Uterine cancer Mother   . Heart disease Mother   . ADD / ADHD Son   . Depression Son   . Hypertension Brother   . Heart disease Brother        Pacemaker   . Heart murmur Brother              Social History:  reports that he has been smoking cigarettes. He has a 106.00 pack-year smoking history. He has never used smokeless tobacco. He reports previous alcohol use. He reports that he does not use drugs.  Allergies  Allergen Reactions  . Varenicline Other (See Comments)    Suicidal thoughts Bad dreams Other  reaction(s): Other (See Comments) Suicidal thoughts  . Gabapentin Other (See Comments)    dizziness    MEDICATIONS:                                                                                                                     Scheduled: . apixaban  5 mg Oral BID  . budesonide  1 puff Inhalation BID  . feeding supplement  237 mL Oral BID BM  . [START ON 03/11/2020] folic acid  2 mg Oral Daily  . influenza vaccine adjuvanted  0.5 mL Intramuscular Tomorrow-1000  . insulin aspart  0-15 Units Subcutaneous TID WC  . insulin aspart  0-5 Units Subcutaneous QHS  . Ipratropium-Albuterol  1 puff Inhalation TID  . lactobacillus  1 g Oral TID WC  . pantoprazole  40 mg Oral Daily  . QUEtiapine  25 mg Oral QHS   Continuous: . ampicillin-sulbactam (UNASYN) IV 3 g (03/10/20 1952)    ROS:  The patient is drowsy and disoriented. He is unable to provide a meaningful ROS.   Blood pressure (!) 109/95, pulse (!) 115, temperature 99.5 F (37.5 C), temperature source Axillary, resp. rate (!) 25, height 5\' 8"  (1.727 m), weight 97.2 kg, SpO2 96 %.   General Examination:                                                                                                       Physical Exam  HEENT-  [place]/AT   Lungs- No increased WOB appreciated. Normal respiratory rate.  Extremities- No edema  Neurological Examination Mental Status: Drowsy with impaired attention. Perseverates often. Paucity of speech output when asked questions and attempting to test conversational ability. Poor situational awareness. Oriented to name but was unable to state that he was in a hospital, but did not attempt to answer any time or location-based orientation questions. In the context of limited speech output, it is fluent. He is able to name his nose and his ear as well as the examiner's thumb, but  lost attention with further attempts to test naming. Unable to follow complex commands. No dysarthria.  Cranial Nerves: II: Blinks to threat bilaterally. PERRL.   III,IV, VI: No ptosis. EOMI. V,VII: Smile symmetric, facial light touch sensation normal bilaterally - patient blinks when attempting to open eyelids for assessment of pupils.  VIII: hearing intact to some questions IX,X: No hypophonia XI: Symmetric XII: Midline tongue extension Motor: Motor testing limited by poor compliance. Will squeeze examiner's hands and lift upper extremities after coaching without gross asymmetry.  Not compliant with BLE motor testing; will move but does not participate in detailed strength testing. Tone and bulk are mildly decreased x 4.  Sensory: Reacts to FT x 4.  Deep Tendon Reflexes: 1+ bilateral brachioradialis. Hypoactive patellar reflexes.  Cerebellar: Not compliant with testing.  Gait: Unable to assess   Lab Results: Basic Metabolic Panel: Recent Labs  Lab 03/04/20 0131 03/04/20 1228 03/05/20 0037 03/06/20 0107 03/07/20 0138 03/08/20 0140 03/09/20 0432 03/10/20 0318  NA 149*   < > 150* 150* 147* 145 141 141  K 2.8*   < > 3.1* 3.7 3.8 3.1* 3.4* 3.8  CL 114*   < > 116* 117* 113* 110 109 109  CO2 23   < > 24 24 25 24 25 25   GLUCOSE 94   < > 119* 66* 113* 127* 128* 104*  BUN 48*   < > 47* 34* 23 20 15 15   CREATININE 1.96*   < > 1.68* 1.28* 1.30* 1.18 1.07 1.09  CALCIUM 8.5*   < > 8.8* 8.9 8.3* 7.9* 8.0* 8.1*  MG 1.7  --  2.1 1.8 1.6* 2.0  --   --    < > = values in this interval not displayed.    CBC: Recent Labs  Lab 03/06/20 0107 03/07/20 0138 03/08/20 0140 03/09/20 1725 03/10/20 0318  WBC 8.5 7.5 5.9 7.8 6.8  HGB 10.2* 10.3* 9.7* 8.3* 7.8*  HCT 32.2* 32.3* 30.7* 26.8* 24.9*  MCV 99.1 96.7 98.4 99.3 98.0  PLT  108* 93* 102* 126* 123*    Cardiac Enzymes: No results for input(s): CKTOTAL, CKMB, CKMBINDEX, TROPONINI in the last 168 hours.  Lipid Panel: No results for  input(s): CHOL, TRIG, HDL, CHOLHDL, VLDL, LDLCALC in the last 168 hours.  Imaging: EEG adult  Result Date: 03/09/2020 Charlsie Quest, MD     03/09/2020  9:04 AM Patient Name: Chaysen Elzinga MRN: [id number] Epilepsy Attending: Charlsie Quest Referring Physician/Provider: Dr Huey Bienenstock Date: 03/08/2020 Duration: 22.24 mins Patient history: 75yo M with ams, EEG to evaluate for seizure Level of alertness: Awake AEDs during EEG study: None Technical aspects: This EEG study was done with scalp electrodes positioned according to the 10-20 International system of electrode placement. Electrical activity was acquired at a sampling rate of 500Hz  and reviewed with a high frequency filter of 70Hz  and a low frequency filter of 1Hz . EEG data were recorded continuously and digitally stored. Description: No posterior dominant rhythm was seen. EEG showed continuous generalized 5 to 6 Hz theta as well as intermittent 2-3hz  delta slowing.  Hyperventilation and photic stimulation were not performed.   ABNORMALITY -Continuous slow, generalized IMPRESSION: This study is suggestive of mild to moderate diffuse encephalopathy, nonspecific etiology. No seizures or epileptiform discharges were seen throughout the recording. Priyanka Annabelle Harman   MRI brain: 1. No acute intracranial abnormality. 2. Moderate chronic small vessel ischemic disease and cerebral atrophy  Assessment: 75 year old male initially admitted for parotitis and sepsis in conjunction with severe encephalopathy. He has improved somewhat this admission, now being verbal and following some commands, but continues to be disoriented and confused with poor attention and apparently no insight regarding his current hospitalization. 1. Exam reveals findings best localizable as diffuse cerebral hypofunction. No focal abnormalities concerning for stroke are seen. No focal twitching to suggest seizure activity. No neck stiffness noted.  2. MRI brain shows no findings  to explain his encephalopathy.  3. B12 level normal. TSH normal. Ammonia normal. RPR unreactive. AST and ALT mildly elevated but do not explain the patient's presentation.  4. EEG: Findings suggestive of mild to moderate diffuse encephalopathy, nonspecific etiology. 5. Most likely etiology for the patient's presentation is felt to be a hospital delirium.   Recommendations: 1. Reorient the patient frequently.  2. Participate in PT/OT and Speech therapy daily.  3. Agree with mobilizing patient OOB to chair daily.  4. Trial of empiric high-dose IV thiamine: 500 mg IV TID x 3 days (ordered)    Electronically signed: Dr. Caryl Pina 03/10/2020, 7:13 PM

## 2020-03-10 NOTE — Progress Notes (Signed)
Patient unable to wear CPAP due to mitts. RT will monitor as needed.

## 2020-03-11 DIAGNOSIS — N183 Chronic kidney disease, stage 3 unspecified: Secondary | ICD-10-CM | POA: Diagnosis not present

## 2020-03-11 DIAGNOSIS — D649 Anemia, unspecified: Secondary | ICD-10-CM | POA: Diagnosis not present

## 2020-03-11 DIAGNOSIS — N179 Acute kidney failure, unspecified: Secondary | ICD-10-CM | POA: Diagnosis not present

## 2020-03-11 DIAGNOSIS — R4182 Altered mental status, unspecified: Secondary | ICD-10-CM | POA: Diagnosis not present

## 2020-03-11 LAB — BASIC METABOLIC PANEL
Anion gap: 9 (ref 5–15)
BUN: 11 mg/dL (ref 8–23)
CO2: 24 mmol/L (ref 22–32)
Calcium: 8.2 mg/dL — ABNORMAL LOW (ref 8.9–10.3)
Chloride: 106 mmol/L (ref 98–111)
Creatinine, Ser: 1.15 mg/dL (ref 0.61–1.24)
GFR, Estimated: >60 mL/min (ref >60)
Glucose, Bld: 107 mg/dL — ABNORMAL HIGH (ref 70–99)
Potassium: 3.8 mmol/L (ref 3.5–5.1)
Sodium: 139 mmol/L (ref 135–145)

## 2020-03-11 LAB — RETICULOCYTES
Immature Retic Fract: 29.2 % — ABNORMAL HIGH (ref 2.3–15.9)
RBC.: 2.4 MIL/uL — ABNORMAL LOW (ref 4.22–5.81)
Retic Count, Absolute: 58.3 10*3/uL (ref 19.0–186.0)
Retic Ct Pct: 2.4 % (ref 0.4–3.1)

## 2020-03-11 LAB — IRON AND TIBC
Iron: 37 ug/dL — ABNORMAL LOW (ref 45–182)
Saturation Ratios: 26 % (ref 17.9–39.5)
TIBC: 143 ug/dL — ABNORMAL LOW (ref 250–450)
UIBC: 106 ug/dL

## 2020-03-11 LAB — MAGNESIUM: Magnesium: 1.5 mg/dL — ABNORMAL LOW (ref 1.7–2.4)

## 2020-03-11 LAB — CBC
HCT: 22.8 % — ABNORMAL LOW (ref 39.0–52.0)
Hemoglobin: 7.6 g/dL — ABNORMAL LOW (ref 13.0–17.0)
MCH: 32.2 pg (ref 26.0–34.0)
MCHC: 33.3 g/dL (ref 30.0–36.0)
MCV: 96.6 fL (ref 80.0–100.0)
Platelets: 134 10*3/uL — ABNORMAL LOW (ref 150–400)
RBC: 2.36 MIL/uL — ABNORMAL LOW (ref 4.22–5.81)
RDW: 17.8 % — ABNORMAL HIGH (ref 11.5–15.5)
WBC: 6.1 10*3/uL (ref 4.0–10.5)
nRBC: 0 % (ref 0.0–0.2)

## 2020-03-11 LAB — PREPARE RBC (CROSSMATCH)

## 2020-03-11 LAB — FOLATE: Folate: 7.7 ng/mL (ref >5.9)

## 2020-03-11 LAB — GLUCOSE, CAPILLARY
Glucose-Capillary: 98 mg/dL (ref 70–99)
Glucose-Capillary: 112 mg/dL — ABNORMAL HIGH (ref 70–99)
Glucose-Capillary: 114 mg/dL — ABNORMAL HIGH (ref 70–99)
Glucose-Capillary: 95 mg/dL (ref 70–99)

## 2020-03-11 LAB — FERRITIN: Ferritin: 335 ng/mL (ref 24–336)

## 2020-03-11 LAB — VITAMIN B12: Vitamin B-12: 469 pg/mL (ref 180–914)

## 2020-03-11 MED ORDER — MAGNESIUM SULFATE 2 GM/50ML IV SOLN
2.0000 g | Freq: Once | INTRAVENOUS | Status: AC
  Administered 2020-03-11: 2 g via INTRAVENOUS
  Filled 2020-03-11: qty 50

## 2020-03-11 MED ORDER — SODIUM CHLORIDE 0.9% IV SOLUTION: Freq: Once | INTRAVENOUS | Status: AC

## 2020-03-11 MED ORDER — THIAMINE HCL 100 MG/ML IJ SOLN
500.0000 mg | Freq: Three times a day (TID) | INTRAVENOUS | Status: AC
  Administered 2020-03-11 – 2020-03-13 (×8): 500 mg via INTRAVENOUS
  Filled 2020-03-11 (×12): qty 5

## 2020-03-11 NOTE — Progress Notes (Signed)
PROGRESS NOTE    Chad Duke  ENM:[id number] DOB: [birth year] DOA: 03/02/2020 PCP: Soundra Pilon, FNP   Brief Narrative:  75 year old residing at [facility] health care, chronic diastolic CHF, HTN, AAA, PE, rheumatoid arthritis, hypogonadism, depression, COPD on chronic 2 L nasal cannula, OSA, HLD was sent to the hospital for poor oral intake and left-sided facial swelling concerning for parotitis.  Patient was diagnosed of COVID 2 weeks ago and treated at the facility.  Seen by ENT in the ER started on Decadron; warm compresses and Unasyn.  Seen by speech therapist, started on dysphagia one diet.  PT is recommending SNF, TOC team aware.  Subjective:  No significant events overnight as discussed with staff, patient himself denies any complaints  Assessment & Plan:   Principal Problem:   Severe sepsis (HCC) Active Problems:   COPD GOLD III    Sleep apnea   Rheumatoid arthritis (HCC)   Morbid obesity due to excess calories (HCC)   History of pulmonary embolism   Acute renal failure superimposed on stage 3 chronic kidney disease (HCC)   Controlled type 2 diabetes mellitus with hyperglycemia (HCC)   Parotitis  Severe sepsis secondary to left parotitis - Sepsis physiology is improving.  Warm compresses.  Aggressive massage - ENT Consult greatly appreciated, recommendation for antibiotic with Unasyn, Decadron x3 doses . - Continue with warm compresses and parotid gland massage, which staff has been doing , he has significantly subsided. -He is afebrile, no leukocytosis, finished 10 days of IV Unasyn, will DC antibiotics. -CT cervical spine-anterior dislocation of left mandibular condyle with soft tissue swelling -Discussed with staff to continue warm compress, and aggressive massage  Acute metabolic encephalopathy -Remains persistent problem, CT head with no acute findings,  MRI has been obtained as well with no acute finding, no evidence of ischemic event,. -EEG with  encephalopathy. -B12, TSH, ammonia within normal limits.  RPR nonreactive -Folic acid acid on the lower side at 4.1, started on supplement. -Thought to be due to infection initially, but this has significantly improved, without improvement of mentation, hospital delirium may be contributing, he is started on low-dose Seroquel. -Neurology consult greatly appreciated, current altered mental status felt likely due to delirium, started on IV thiamine. Orient patient frequently, try to mobilize, and out of bed to chair daily. -Have discussed with his niece HCPOA, patient had previous stay at [facility] due to his COPD, but overall has managed to live by himself independently from March/2021 till recently admitted to acute rehab at [facility] health care last December due to multiple falls, but at baseline he is awake alert x3, communicative and appropriate, patient has no history of alcohol abuse, she noticed decline at the facility around the 13th of this month after diagnosis of COVID-19.  Anemia -Been slowly trending down, his baseline is around 10 last year, globin was elevated on presentation in the setting of sepsis and dehydration, it has been drifting down slowly in the setting of IV hydration . -Globin low at 7.6 this morning, anemia panel significant for anemia of chronic illness, no evidence of significant GI bleed, but still awaiting Hemoccult(most recent bowel movement was 1/29 normal in color). -Patient with left upper extremity significant bruising most likely contributing to his anemia, please see picture below. -Given is 7.6 today, will give 1 unit PRBC for symptomatic anemia as he is tachycardic.  Hypomagnesemia -Repleted  Thrombocytopenia -Mild, continue to monitor.    Acute kidney injury on CKD stage IIIa, prerenal - Baseline creatinine 1.8.  Admission creatinine 3.22.  - resolved - Lasix on hold  Dysphagia likely from peritonitis -Tolerating p.o. intake  Hypokalemia -  repleted  hypernatremia -Resolved with IV fluids.  Recent COVID-19 infection, asymptomatic -Chest x-ray negative for any active infiltrates.  Currently on 2 L nasal cannula  Chronic hypoxia secondary to COPD - Home uses 2-3 L nasal cannula. - Continue bronchodilators, already on steroids  History of pulmonary embolism - On home Eliquis , currently due to mentation, mentation appears to be improving, will resume back on Eliquis.  Diabetes mellitus type 2 - Glucophage on hold -Accu-Cheks and sliding scale  History of gout - Continue allopurinol  Right lower extremity wound -Wound care consulted -Patient had small amount of oozing from the wound, resolved after holding Eliquis for 24 hours.  History of RA -On Remicade as an outpatient  Foley catheter discontinued 1/26, no evidence of retention.  DVT prophylaxis: Lovenox Code Status: DNR Family Communication: D/W with his niece Darlyn Read on 1/28, 1/31 Status is: Inpatient  Remains inpatient appropriate because:IV treatments appropriate due to intensity of illness or inability to take PO   Dispo: The patient is from: SNF              Anticipated d/c is to: SNF              Anticipated d/c date is: 3 days              Patient currently is not medically stable to d/c.  Remains significantly altered.     Difficult to place patient No   Body mass index is 32.58 kg/m.  Pressure Injury 03/06/20 Heel Left Deep Tissue Pressure Injury - Purple or maroon localized area of discolored intact skin or blood-filled blister due to damage of underlying soft tissue from pressure and/or shear. (Active)  03/06/20 1932  Location: Heel  Location Orientation: Left  Staging: Deep Tissue Pressure Injury - Purple or maroon localized area of discolored intact skin or blood-filled blister due to damage of underlying soft tissue from pressure and/or shear.  Wound Description (Comments):   Present on Admission:      Examination:  Patient  is more lethargic this morning, but wakes up, answer yes/no questions, but he remains significantly confused . Symmetrical Chest wall movement, Good air movement bilaterally, CTAB RRR,No Gallops,Rubs or new Murmurs, No Parasternal Heave +ve B.Sounds, Abd Soft, No tenderness, No rebound - guarding or rigidity. No Cyanosis, Clubbing or edema, left   upper extremity bruise      Objective: Vitals:   03/11/20 0218 03/11/20 0415 03/11/20 0735 03/11/20 1219  BP: 118/77 126/72 118/71 126/80  Pulse:   100 100  Resp: Temp: 98.5 F (36.9 C) 98.9 F (37.2 C) 98.7 F (37.1 C) 98.7 F (37.1 C)  TempSrc: Axillary Axillary Oral Oral  SpO2: 93% 93% 93% 93%  Weight:      Height:        Intake/Output Summary (Last 24 hours) at 03/11/2020 1433 Last data filed at 03/11/2020 1348 Gross per 24 hour  Intake 780 ml  Output 651 ml  Net 129 ml   Filed Weights   03/02/20 1146 03/04/20 0318  Weight: 99.8 kg 97.2 kg     Data Reviewed:   CBC: Recent Labs  Lab 03/07/20 0138 03/08/20 0140 03/09/20 1725 03/10/20 0318 03/11/20 0311  WBC 7.5 5.9 7.8 6.8 6.1  HGB 10.3* 9.7* 8.3* 7.8* 7.6*  HCT 32.3* 30.7* 26.8* 24.9* 22.8*  MCV  96.7 98.4 99.3 98.0 96.6  PLT 93* 102* 126* 123* 134*   Basic Metabolic Panel: Recent Labs  Lab 03/05/20 0037 03/06/20 0107 03/07/20 0138 03/08/20 0140 03/09/20 0432 03/10/20 0318 03/11/20 0311  NA 150* 150* 147* 145 141 141 139  K 3.1* 3.7 3.8 3.1* 3.4* 3.8 3.8  CL 116* 117* 113* 110 109 109 106  CO2 24 24 25 24 25 25 24   GLUCOSE 119* 66* 113* 127* 128* 104* 107*  BUN 47* 34* 23 20 15 15 11   CREATININE 1.68* 1.28* 1.30* 1.18 1.07 1.09 1.15  CALCIUM 8.8* 8.9 8.3* 7.9* 8.0* 8.1* 8.2*  MG 2.1 1.8 1.6* 2.0  --   --  1.5*   GFR: Estimated Creatinine Clearance: 62.7 mL/min (by C-G formula based on SCr of 1.15 mg/dL). Liver Function Tests: Recent Labs  Lab 03/10/20 0318  AST 45*  ALT 62*  ALKPHOS 105  BILITOT 0.7  PROT 5.0*  ALBUMIN  1.6*   No results for input(s): LIPASE, AMYLASE in the last 168 hours. Recent Labs  Lab 03/08/20 1619  AMMONIA 34   Coagulation Profile: No results for input(s): INR, PROTIME in the last 168 hours. Cardiac Enzymes: No results for input(s): CKTOTAL, CKMB, CKMBINDEX, TROPONINI in the last 168 hours. BNP (last 3 results) No results for input(s): PROBNP in the last 8760 hours. HbA1C: No results for input(s): HGBA1C in the last 72 hours. CBG: Recent Labs  Lab 03/10/20 1236 03/10/20 1722 03/10/20 2014 03/11/20 0737 03/11/20 1218  GLUCAP 101* 93 139* 98 112*   Lipid Profile: No results for input(s): CHOL, HDL, LDLCALC, TRIG, CHOLHDL, LDLDIRECT in the last 72 hours. Thyroid Function Tests: Recent Labs    03/08/20 1550  TSH 2.478   Anemia Panel: Recent Labs    03/08/20 1550 03/11/20 0311  VITAMINB12 618 469  FOLATE 4.1* 7.7  FERRITIN  --  335  TIBC  --  143*  IRON  --  37*  RETICCTPCT  --  2.4   Sepsis Labs: Recent Labs  Lab 03/09/20 1725 03/10/20 0318  PROCALCITON <0.10 <0.10  LATICACIDVEN 2.1*  --     Recent Results (from the past 240 hour(s))  Culture, blood (routine x 2)     Status: None   Collection Time: 03/02/20 11:43 AM   Specimen: BLOOD  Result Value Ref Range Status   Specimen Description BLOOD RIGHT ANTECUBITAL  Final   Special Requests   Final    BOTTLES DRAWN AEROBIC AND ANAEROBIC Blood Culture adequate volume   Culture   Final    NO GROWTH 5 DAYS Performed at [facility], [address], [place], [address] [place]    Report Status 03/07/2020 FINAL  Final  Urine culture     Status: None   Collection Time: 03/02/20 11:43 AM   Specimen: Urine, Random  Result Value Ref Range Status   Specimen Description URINE, RANDOM  Final   Special Requests NONE  Final   Culture   Final    NO GROWTH Performed at [facility], [address], [place], [address] [place]    Report Status 03/03/2020 FINAL  Final  SARS Coronavirus 2 by RT PCR  (hospital order, performed in [facility] hospital lab) Nasopharyngeal Nasopharyngeal Swab     Status: Abnormal   Collection Time: 03/02/20 11:46 AM   Specimen: Nasopharyngeal Swab  Result Value Ref Range Status   SARS Coronavirus 2 POSITIVE (A) NEGATIVE Final    Comment: RESULT CALLED TO, READ  BACK BY AND VERIFIED WITH: RN Colonel Bald [phone] AT 1405 BY CM (NOTE) SARS-CoV-2 target nucleic acids are DETECTED  SARS-CoV-2 RNA is generally detectable in upper respiratory specimens  during the acute phase of infection.  Positive results are indicative  of the presence of the identified virus, but do not rule out bacterial infection or co-infection with other pathogens not detected by the test.  Clinical correlation with patient history and  other diagnostic information is necessary to determine patient infection status.  The expected result is negative.  Fact Sheet for Patients:   BoilerBrush.com.cy   Fact Sheet for Healthcare Providers:   https://pope.com/    This test is not yet approved or cleared by the [place] FDA and  has been authorized for detection and/or diagnosis of SARS-CoV-2 by FDA under an Emergency Use Authorization (EUA).  This EUA will remain in effect (meaning this test c an be used) for the duration of  the COVID-19 declaration under Section 564(b)(1) of the Act, 21 U.S.C. section 360-bbb-3(b)(1), unless the authorization is terminated or revoked sooner.  Performed at [facility], [address] [place], [place] [address]   Culture, blood (routine x 2)     Status: None   Collection Time: 03/03/20  4:22 AM   Specimen: BLOOD  Result Value Ref Range Status   Specimen Description BLOOD SITE NOT SPECIFIED  Final   Special Requests   Final    BOTTLES DRAWN AEROBIC ONLY Blood Culture results may not be optimal due to an inadequate volume of blood received in culture bottles   Culture   Final    NO GROWTH 5  DAYS Performed at [facility], [address] [place], [place] [address]    Report Status 03/08/2020 FINAL  Final  MRSA PCR Screening     Status: Abnormal   Collection Time: 03/03/20  8:47 PM   Specimen: Nasopharyngeal  Result Value Ref Range Status   MRSA by PCR POSITIVE (A) NEGATIVE Final    Comment:        The GeneXpert MRSA Assay (FDA approved for NASAL specimens only), is one component of a comprehensive MRSA colonization surveillance program. It is not intended to diagnose MRSA infection nor to guide or monitor treatment for MRSA infections. RESULT CALLED TO, READ BACK BY AND VERIFIED WITH: [facility] RN 2249 03/03/2020 MITCHELL,L Performed at [facility], [address], [place], [place] [address]   MRSA PCR Screening     Status: Abnormal   Collection Time: 03/08/20  8:47 AM  Result Value Ref Range Status   MRSA by PCR POSITIVE (A) NEGATIVE Final    Comment:        The GeneXpert MRSA Assay (FDA approved for NASAL specimens only), is one component of a comprehensive MRSA colonization surveillance program. It is not intended to diagnose MRSA infection nor to guide or monitor treatment for MRSA infections. RESULT CALLED TO, READ BACK BY AND VERIFIED WITH: Timoteo Expose RN 12:40 03/08/20 ([email or web address]) Performed at [facility], [address], [place], [place] [address]          Radiology Studies: No results found.      Scheduled Meds: . apixaban  5 mg Oral BID  . budesonide  1 puff Inhalation BID  . feeding supplement  237 mL Oral BID BM  . folic acid  2 mg Oral Daily  . influenza vaccine adjuvanted  0.5 mL Intramuscular Tomorrow-1000  . insulin aspart  0-15  Units Subcutaneous TID WC  . insulin aspart  0-5 Units Subcutaneous QHS  . Ipratropium-Albuterol  1 puff Inhalation TID  . lactobacillus  1 g Oral TID WC  . pantoprazole  40 mg Oral Daily  . QUEtiapine  25 mg Oral QHS   Continuous Infusions: . ampicillin-sulbactam (UNASYN) IV 3 g  (03/11/20 1418)  . thiamine injection 500 mg (03/11/20 0949)     LOS: 9 days     Huey Bienenstock, MD [facility]  If 7PM-7AM, please contact night-coverage  03/11/2020, 2:33 PM

## 2020-03-11 NOTE — Progress Notes (Signed)
Today is D10 of Unasyn for parotitis. Ok to stop after doses today per Dr. Randol Kern.  Ulyses Southward, PharmD, BCIDP, AAHIVP, CPP Infectious Disease Pharmacist 03/11/2020 2:22 PM

## 2020-03-11 NOTE — Progress Notes (Signed)
Patient unable to wear CPAP due to mitts on hands. RT will monitor as needed.

## 2020-03-11 NOTE — Progress Notes (Signed)
Physical Therapy Treatment Patient Details Name: Chad Duke MRN: [id number] DOB: Mar 02, 1945 Today's Date: 03/11/2020    History of Present Illness 75 y.o. male who lives at [facility] care and has a h/o chronic diastolic CHF, AAA, HTN, HLD, PE, rheumatoid arthritis, hypogonadism, depression, nephrolithiasis, COPD on 2 L O2 and OSA. In ED 02/20/20 for 2x fall and AMS. Sent from SNF [facility]) 03/02/20 for confusion and progressively poor oral intake. In ED found to have a left facial swelling, is febrile and tachycardic. Niece mentioned that pt was diagnosed COVID+ 2 weeks ago at facility. Released from COVID isolation at his facility a week ago. Admitted for treatment of severe sepsis secondary to L parotitis and acute metabolic encephalopathy.    PT Comments    Pt making slow progress towards his goals, he continues to be confused, requiring increased time for processing questions/commands. Pt up in chair on entry, slid far down in the chair. After PT/OT scoot up in chair pt requires max-total A to achieve seated balance at edge of chair. Pt with increased posterior lean. Pt requires total A for coming to standing, and does better with tactile rather than verbal cues. Pt requires SNF level rehab to regain safe mobility. PT will continue to follow acutely.     Follow Up Recommendations  SNF     Equipment Recommendations  None recommended by PT       Precautions / Restrictions Precautions Precautions: Fall Precaution Comments: 2 documented falls during ED visit 02/20/20 Restrictions Weight Bearing Restrictions: No    Mobility  Bed Mobility               General bed mobility comments: OOB in recliner  Transfers Overall transfer level: Needs assistance Equipment used: 2 person hand held assist Transfers: Sit to/from Stand Sit to Stand: Total assist;+2 physical assistance;+2 safety/equipment         General transfer comment: tactile/verbal cues to initiate  movements but with minimal carryover (<25%). max mulitmodal cues for upright position once in standing. completed x2 from recliner chair        Balance Overall balance assessment: Needs assistance;History of Falls   Sitting balance-Leahy Scale: Zero Sitting balance - Comments: unable to maintain static balance without max-totalA   Standing balance support: Bilateral upper extremity supported Standing balance-Leahy Scale: Zero                              Cognition Arousal/Alertness: Awake/alert Behavior During Therapy: Impulsive;Restless Overall Cognitive Status: History of cognitive impairments - at baseline Area of Impairment: Orientation;Memory;Following commands;Attention;Safety/judgement;Awareness;Problem solving                 Orientation Level: Disoriented to;Place;Time;Situation Current Attention Level: Focused Memory: Decreased short-term memory;Decreased recall of precautions Following Commands: Follows one step commands inconsistently Safety/Judgement: Decreased awareness of safety;Decreased awareness of deficits Awareness: Intellectual Problem Solving: Requires verbal cues;Requires tactile cues;Difficulty sequencing;Decreased initiation General Comments: remains with poor command follow, decreased processing and initiation of tasks even with multimodal cues, pleasantly confused today         General Comments General comments (skin integrity, edema, etc.): HR up to 120s with standing activity, SpO2 >92% on RA      Pertinent Vitals/Pain Pain Assessment: Faces Faces Pain Scale: No hurt Pain Intervention(s): Monitored during session;Repositioned           PT Goals (current goals can now be found in the care plan section) Acute Rehab PT  Goals Patient Stated Goal: unable to state PT Goal Formulation: Patient unable to participate in goal setting Time For Goal Achievement: 03/18/20 Potential to Achieve Goals: Poor Progress towards PT goals:  Progressing toward goals    Frequency    Min 2X/week      PT Plan Current plan remains appropriate    Co-evaluation PT/OT/SLP Co-Evaluation/Treatment: Yes Reason for Co-Treatment: Necessary to address cognition/behavior during functional activity PT goals addressed during session: Mobility/safety with mobility OT goals addressed during session: ADL's and self-care      AM-PAC PT "6 Clicks" Mobility   Outcome Measure  Help needed turning from your back to your side while in a flat bed without using bedrails?: Total Help needed moving from lying on your back to sitting on the side of a flat bed without using bedrails?: Total Help needed moving to and from a bed to a chair (including a wheelchair)?: Total Help needed standing up from a chair using your arms (e.g., wheelchair or bedside chair)?: Total Help needed to walk in hospital room?: Total Help needed climbing 3-5 steps with a railing? : Total 6 Click Score: 6    End of Session Equipment Utilized During Treatment: Oxygen;Gait belt Activity Tolerance: Patient tolerated treatment well Patient left: with call bell/phone within reach;in chair;Other (comment);with restraints reapplied (telesitter, mittens replaced)   PT Visit Diagnosis: Other abnormalities of gait and mobility (R26.89);Muscle weakness (generalized) (M62.81);Difficulty in walking, not elsewhere classified (R26.2);Repeated falls (R29.6);History of falling (Z91.81);Unsteadiness on feet (R26.81);Adult, failure to thrive (R62.7)     Time: 1035-1100 PT Time Calculation (min) (ACUTE ONLY): 25 min  Charges:  $Therapeutic Activity: 8-22 mins                     Shaydon Lease B. Beverely Risen PT, DPT Acute Rehabilitation Services Pager [phone] Office [phone]    [name] [facility] 03/11/2020, 12:35 PM

## 2020-03-11 NOTE — Progress Notes (Signed)
Occupational Therapy Treatment Patient Details Name: Chad Duke MRN: [id number] DOB: 04/22/1945 Today's Date: 03/11/2020    History of present illness 75 y.o. male who lives at [facility] care and has a h/o chronic diastolic CHF, AAA, HTN, HLD, PE, rheumatoid arthritis, hypogonadism, depression, nephrolithiasis, COPD on 2 L O2 and OSA. In ED 02/20/20 for 2x fall and AMS. Sent from SNF [facility]) 03/02/20 for confusion and progressively poor oral intake. In ED found to have a left facial swelling, is febrile and tachycardic. Niece mentioned that pt was diagnosed COVID+ 2 weeks ago at facility. Released from COVID isolation at his facility a week ago. Admitted for treatment of severe sepsis secondary to L parotitis and acute metabolic encephalopathy.   OT comments  Pt with slow progress towards OT goals. He presents OOB in recliner (up via [employer] with nursing staff). Pt pleasantly confused today, able to state his first name given max questioning cues to do so. Pt remains with delayed processing and decreased initiation to actively carry out instruction/motor tasks. Pt engaging in simple face washing task given overall modA initially but is able to carryover with minguard assist. Pt tolerating x2 sit<>stand from recliner chair but requiring totalA+2 for completion. Pt with poor sitting balance and unable to maintain upright at edge of recliner without max-totalA given retropulsion. SNF recommendation remains most appropriate at this time. Acute OT to follow.   Follow Up Recommendations  SNF    Equipment Recommendations  Other (comment) (TBD)          Precautions / Restrictions Precautions Precautions: Fall Precaution Comments: 2 documented falls during ED visit 02/20/20 Restrictions Weight Bearing Restrictions: No       Mobility Bed Mobility               General bed mobility comments: OOB in recliner  Transfers Overall transfer level: Needs  assistance Equipment used: 2 person hand held assist Transfers: Sit to/from Stand Sit to Stand: Total assist;+2 physical assistance;+2 safety/equipment         General transfer comment: tactile/verbal cues to initiate movements but with minimal carryover (<25%). max mulitmodal cues for upright position once in standing. completed x2 from recliner chair    Balance Overall balance assessment: Needs assistance;History of Falls   Sitting balance-Leahy Scale: Zero Sitting balance - Comments: unable to maintain static balance without max-totalA   Standing balance support: Bilateral upper extremity supported Standing balance-Leahy Scale: Zero                             ADL either performed or assessed with clinical judgement   ADL Overall ADL's : Needs assistance/impaired     Grooming: Sitting;Wash/dry face;Moderate assistance Grooming Details (indicate cue type and reason): assist and max cues to initiate task, once initiated pt able to carry out on his own                             Functional mobility during ADLs: Total assistance;+2 for physical assistance;+2 for safety/equipment (sit<>stand attempts)                         Cognition Arousal/Alertness: Awake/alert Behavior During Therapy: Impulsive;Restless Overall Cognitive Status: History of cognitive impairments - at baseline Area of Impairment: Orientation;Memory;Following commands;Attention;Safety/judgement;Awareness;Problem solving                 Orientation Level: Disoriented  to;Place;Time;Situation Current Attention Level: Focused Memory: Decreased short-term memory;Decreased recall of precautions Following Commands: Follows one step commands inconsistently Safety/Judgement: Decreased awareness of safety;Decreased awareness of deficits Awareness: Intellectual Problem Solving: Requires verbal cues;Requires tactile cues;Difficulty sequencing;Decreased initiation General  Comments: remains with poor command follow, decreased processing and initiation of tasks even with multimodal cues, pleasantly confused today        Exercises     Shoulder Instructions       General Comments HR up to 120s with standing activity, SpO2 >92% on RA    Pertinent Vitals/ Pain       Pain Assessment: Faces Faces Pain Scale: No hurt Pain Intervention(s): Monitored during session  Home Living                                          Prior Functioning/Environment              Frequency  Min 2X/week        Progress Toward Goals  OT Goals(current goals can now be found in the care plan section)  Progress towards OT goals: Progressing toward goals  Acute Rehab OT Goals Patient Stated Goal: unable to state OT Goal Formulation: Patient unable to participate in goal setting Time For Goal Achievement: 03/20/20 Potential to Achieve Goals: Fair ADL Goals Pt Will Perform Grooming: with min assist;sitting Pt Will Perform Upper Body Dressing: with mod assist;sitting Pt Will Transfer to Toilet: with mod assist;stand pivot transfer Additional ADL Goal #1: Pt will follow 1 step commands with 50% accuracy during ADL/functional task. Additional ADL Goal #2: Pt will sustain attention to ADL task >3 min with no more than mod cues. Additional ADL Goal #3: Pt will maintain sitting balance EOB with minguard assist as precursor to ADL.  Plan Discharge plan remains appropriate    Co-evaluation    PT/OT/SLP Co-Evaluation/Treatment: Yes Reason for Co-Treatment: Necessary to address cognition/behavior during functional activity;For patient/therapist safety;To address functional/ADL transfers   OT goals addressed during session: ADL's and self-care      AM-PAC OT "6 Clicks" Daily Activity     Outcome Measure   Help from another person eating meals?: Total Help from another person taking care of personal grooming?: A Lot Help from another person  toileting, which includes using toliet, bedpan, or urinal?: Total Help from another person bathing (including washing, rinsing, drying)?: Total Help from another person to put on and taking off regular upper body clothing?: Total Help from another person to put on and taking off regular lower body clothing?: Total 6 Click Score: 7    End of Session Equipment Utilized During Treatment: Gait belt  OT Visit Diagnosis: Other abnormalities of gait and mobility (R26.89);Muscle weakness (generalized) (M62.81);Other symptoms and signs involving cognitive function   Activity Tolerance Patient tolerated treatment well   Patient Left in chair;with call bell/phone within reach;with restraints reapplied;Other (comment) (telesitter present)   Nurse Communication Mobility status        Time: 1035-1100 OT Time Calculation (min): 25 min  Charges: OT General Charges $OT Visit: 1 Visit OT Treatments $Self Care/Home Management : 8-22 mins  Marcy Siren, OT Acute Rehabilitation Services Pager [phone] Office [phone]    Orlando Penner 03/11/2020, 12:22 PM

## 2020-03-11 NOTE — Progress Notes (Signed)
Pt continues slight tachycardia and tachypneia.    03/11/20 0014  Assess: MEWS Score  Temp 98.8 F (37.1 C)  BP 124/67  ECG Heart Rate (!) 107  Resp (!) 22  Level of Consciousness Alert  SpO2 95 %  O2 Device Room Air  Assess: MEWS Score  MEWS Temp 0  MEWS Systolic 0  MEWS Pulse 1  MEWS RR 1  MEWS LOC 0  MEWS Score 2  MEWS Score Color Yellow  Assess: if the MEWS score is Yellow or Red  Were vital signs taken at a resting state? Yes  Focused Assessment No change from prior assessment  Early Detection of Sepsis Score *See Row Information* Low  MEWS guidelines implemented *See Row Information* Yes  Treat  MEWS Interventions Administered scheduled meds/treatments  Take Vital Signs  Increase Vital Sign Frequency  Yellow: Q 2hr X 2 then Q 4hr X 2, if remains yellow, continue Q 4hrs  Escalate  MEWS: Escalate Yellow: discuss with charge nurse/RN and consider discussing with provider and RRT  Notify: Charge Nurse/RN  Name of Charge Nurse/RN Notified Nikki  Date Charge Nurse/RN Notified 03/11/20  Time Charge Nurse/RN Notified 0024

## 2020-03-12 DIAGNOSIS — R4182 Altered mental status, unspecified: Secondary | ICD-10-CM | POA: Diagnosis not present

## 2020-03-12 DIAGNOSIS — E1165 Type 2 diabetes mellitus with hyperglycemia: Secondary | ICD-10-CM | POA: Diagnosis not present

## 2020-03-12 LAB — BPAM RBC
Blood Product Expiration Date: 202202192359
ISSUE DATE / TIME: 202201311749
Unit Type and Rh: 6200

## 2020-03-12 LAB — BASIC METABOLIC PANEL
Anion gap: 8 (ref 5–15)
BUN: 13 mg/dL (ref 8–23)
CO2: 24 mmol/L (ref 22–32)
Calcium: 8 mg/dL — ABNORMAL LOW (ref 8.9–10.3)
Chloride: 106 mmol/L (ref 98–111)
Creatinine, Ser: 1.32 mg/dL — ABNORMAL HIGH (ref 0.61–1.24)
GFR, Estimated: 56 mL/min — ABNORMAL LOW (ref >60)
Glucose, Bld: 117 mg/dL — ABNORMAL HIGH (ref 70–99)
Potassium: 3.6 mmol/L (ref 3.5–5.1)
Sodium: 138 mmol/L (ref 135–145)

## 2020-03-12 LAB — CBC
HCT: 24.4 % — ABNORMAL LOW (ref 39.0–52.0)
Hemoglobin: 8.2 g/dL — ABNORMAL LOW (ref 13.0–17.0)
MCH: 31.4 pg (ref 26.0–34.0)
MCHC: 33.6 g/dL (ref 30.0–36.0)
MCV: 93.5 fL (ref 80.0–100.0)
Platelets: 133 10*3/uL — ABNORMAL LOW (ref 150–400)
RBC: 2.61 MIL/uL — ABNORMAL LOW (ref 4.22–5.81)
RDW: 19.6 % — ABNORMAL HIGH (ref 11.5–15.5)
WBC: 7.2 10*3/uL (ref 4.0–10.5)
nRBC: 0 % (ref 0.0–0.2)

## 2020-03-12 LAB — GLUCOSE, CAPILLARY
Glucose-Capillary: 122 mg/dL — ABNORMAL HIGH (ref 70–99)
Glucose-Capillary: 131 mg/dL — ABNORMAL HIGH (ref 70–99)
Glucose-Capillary: 154 mg/dL — ABNORMAL HIGH (ref 70–99)
Glucose-Capillary: 69 mg/dL — ABNORMAL LOW (ref 70–99)
Glucose-Capillary: 74 mg/dL (ref 70–99)
Glucose-Capillary: 94 mg/dL (ref 70–99)

## 2020-03-12 LAB — TYPE AND SCREEN
ABO/RH(D): A POS
Antibody Screen: NEGATIVE
Unit division: 0

## 2020-03-12 LAB — OCCULT BLOOD X 1 CARD TO LAB, STOOL: Fecal Occult Bld: NEGATIVE

## 2020-03-12 MED ORDER — DEXTROSE 50 % IV SOLN
12.5000 g | INTRAVENOUS | Status: AC
  Administered 2020-03-12: 12.5 g via INTRAVENOUS
  Filled 2020-03-12: qty 50

## 2020-03-12 MED ORDER — POLYETHYLENE GLYCOL 3350 17 G PO PACK
34.0000 g | PACK | Freq: Once | ORAL | Status: AC
  Administered 2020-03-12: 34 g via ORAL
  Filled 2020-03-12: qty 2

## 2020-03-12 MED ORDER — HALOPERIDOL LACTATE 5 MG/ML IJ SOLN
2.0000 mg | Freq: Once | INTRAMUSCULAR | Status: AC
  Administered 2020-03-12: 2 mg via INTRAVENOUS
  Filled 2020-03-12: qty 1

## 2020-03-12 NOTE — Progress Notes (Addendum)
Brief Progress note   The following was taken from the chart:  "Assessment: 75 year old male initially admitted for parotitis and sepsis in conjunction with severe encephalopathy. He has improved somewhat this admission, now being verbal and following some commands, but continues to be disoriented and confused with poor attention and apparently no insight regarding his current hospitalization. 1. Exam reveals findings best localizable as diffuse cerebral hypofunction. No focal abnormalities concerning for stroke are seen. No focal twitching to suggest seizure activity. No neck stiffness noted.  2. MRI brain shows no findings to explain his encephalopathy.  3. B12 level normal. TSH normal. Ammonia normal. RPR unreactive. AST and ALT mildly elevated but do not explain the patient's presentation.  4. EEG: Findings suggestive of mild to moderate diffuse encephalopathy, nonspecific etiology. 5. Most likely etiology for the patient's presentation is felt to be a hospital delirium.   Recommendations: 1. Reorient the patient frequently.  2. Participate in PT/OT and Speech therapy daily.  3. Agree with mobilizing patient OOB to chair daily.  4. Trial of empiric high-dose IV thiamine: 500 mg IV TID x 3 days (ordered)"   Based on chart review, at this point there is no further neurologic workup needed at this time neurology will remain available for questions.   Marisue Humble, MD Page: [phone]

## 2020-03-12 NOTE — Consult Note (Signed)
WOC Nurse wound follow up Patient receiving care in [facility] 5W14 Remote follow up completed via Camera Wound type: DTPI on the left heel Measurement: About the size of a nickel per CN Sarah.  Wound bed: purple  Drainage (amount, consistency, odor) None Periwound: Intact Dressing procedure/placement/frequency:  Continue foam dressing and prevalon boots. Stable and continues to improve. WOC will no longer follow.   Clean right shin wound with soap and water, dry and place a piece of Xeroform gauze [place] # [id number]) over the wound, cover with 4x4 then wrap with Kerlix. Change daily.   Monitor the wound area(s) for worsening of condition such as: Signs/symptoms of infection, increase in size, development of or worsening of odor, development of pain, or increased pain at the affected locations.   Notify the medical team if any of these develop.  Thank you for the consult. WOC nurse will not follow at this time.   Please re-consult the WOC team if needed.  Renaldo Reel Katrinka Blazing, MSN, RN, CMSRN, Angus Seller, [facility] Wound Treatment Associate Pager [phone]

## 2020-03-12 NOTE — Progress Notes (Signed)
Pt has mitts on at this time. Unable to wear CPAP.

## 2020-03-12 NOTE — Progress Notes (Signed)
PROGRESS NOTE    Chad Duke  ZDG:[id number] DOB: 11-12-1945 DOA: 03/02/2020 PCP: Soundra Pilon, FNP   Brief Narrative:  75 year old residing at [facility] health care, chronic diastolic CHF, HTN, AAA, PE, rheumatoid arthritis, hypogonadism, depression, COPD on chronic 2 L nasal cannula, OSA, HLD was sent to the hospital for poor oral intake and left-sided facial swelling concerning for parotitis.  Patient was diagnosed of COVID 2 weeks ago and treated at the facility.  Seen by ENT in the ER started on Decadron; warm compresses and Unasyn.  Seen by speech therapist, started on dysphagia one diet.  PT is recommending SNF, TOC team aware.  Subjective:  Significant events as discussed with staff overnight, but patient did sit with a Hoyer lift at chair for few hours yesterday, and same thing today, no bowel movement yet, no urinary retention as well .  Assessment & Plan:   Principal Problem:   Severe sepsis (HCC) Active Problems:   COPD GOLD III    Sleep apnea   Rheumatoid arthritis (HCC)   Morbid obesity due to excess calories (HCC)   History of pulmonary embolism   Acute renal failure superimposed on stage 3 chronic kidney disease (HCC)   Controlled type 2 diabetes mellitus with hyperglycemia (HCC)   Parotitis  Severe sepsis secondary to left parotitis - Sepsis physiology is improving.  Warm compresses.  Aggressive massage - ENT Consult greatly appreciated, recommendation for antibiotic with Unasyn, Decadron x3 doses . - Continue with warm compresses and parotid gland massage, which staff has been doing , he has significantly subsided. -He is afebrile, no leukocytosis, finished 10 days of IV Unasyn. -CT cervical spine-anterior dislocation of left mandibular condyle with soft tissue swelling -Discussed with staff to continue warm compress, and aggressive massage  Acute metabolic encephalopathy -Remains persistent problem, CT head with no acute findings,  MRI has been obtained as  well with no acute finding, no evidence of ischemic event,. -EEG with encephalopathy. -B12, TSH, ammonia within normal limits.  RPR nonreactive -Folic acid acid on the lower side at 4.1, started on supplement. -Thought to be due to infection initially, but this has significantly improved, without improvement of mentation, hospital delirium may be contributing, he is started on low-dose Seroquel. -Neurology consult greatly appreciated, current altered mental status felt likely due to delirium, started on IV thiamine. Orient patient frequently, try to mobilize, and out of bed to chair daily. -Have discussed with his niece HCPOA, patient had previous stay at [facility] due to his COPD, but overall has managed to live by himself independently from March/2021 till recently admitted to acute rehab at [facility] health care last December due to multiple falls, but at baseline he is awake alert x3, communicative and appropriate, patient has no history of alcohol abuse, she noticed decline at the facility around the 13th of this month after diagnosis of COVID-19. -Patient is out of bed to chair this morning, he is awake alert x1, he is more communicative, but remains with significant impaired cognition and insight, discussed with neurology, they will reassess today to see if there is any further recommendations, he is receiving IV thiamine 500 mg 3 times daily x3 days.  Anemia -Hemoglobin slowly trending down, his baseline is around 10 last November.  Hemoglobin initially elevated on admission due to dehydration.  Work-up significant of anemia of chronic disease with ferritin of 335 -Drifting down slowly during hospital stay, no evidence of GI bleed, but awaiting bowel movement to check Hemoccult. -Has significant left arm  bruising most likely contributing to his anemia. -Transfused 1 unit PRBC on 1/31, with good response, hemoglobin is 8.2 this morning.  Hypomagnesemia -Repleted  Thrombocytopenia -Mild,  continue to monitor.    Acute kidney injury on CKD stage IIIa, prerenal - Baseline creatinine 1.8.  Admission creatinine 3.22.  - resolved - Lasix on hold  Dysphagia likely from peritonitis -Tolerating p.o. intake  Hypokalemia - repleted  hypernatremia -Resolved with IV fluids.  Recent COVID-19 infection, asymptomatic -Chest x-ray negative for any active infiltrates.  Currently on 2 L nasal cannula  Chronic hypoxia secondary to COPD - Home uses 2-3 L nasal cannula. - Continue bronchodilators, already on steroids  History of pulmonary embolism - On home Eliquis , currently due to mentation, mentation appears to be improving, will resume back on Eliquis.  Diabetes mellitus type 2 - Glucophage on hold -Accu-Cheks and sliding scale  History of gout - Continue allopurinol  Right lower extremity wound -Wound care consulted -Patient had small amount of oozing from the wound, resolved after holding Eliquis for 24 hours.  History of RA -On Remicade as an outpatient  Foley catheter discontinued 1/26, no evidence of retention.  DVT prophylaxis: Lovenox Code Status: DNR Family Communication: D/W with his niece Darlyn Read on 1/28, 1/31, 2/1 Status is: Inpatient  Remains inpatient appropriate because:IV treatments appropriate due to intensity of illness or inability to take PO   Dispo: The patient is from: SNF              Anticipated d/c is to: SNF              Anticipated d/c date is: 3 days              Patient currently is not medically stable to d/c.  Remains significantly altered.     Difficult to place patient No   Body mass index is 32.58 kg/m.  Pressure Injury 03/06/20 Heel Left Deep Tissue Pressure Injury - Purple or maroon localized area of discolored intact skin or blood-filled blister due to damage of underlying soft tissue from pressure and/or shear. (Active)  03/06/20 1932  Location: Heel  Location Orientation: Left  Staging: Deep Tissue Pressure  Injury - Purple or maroon localized area of discolored intact skin or blood-filled blister due to damage of underlying soft tissue from pressure and/or shear.  Wound Description (Comments):   Present on Admission:      Examination:  Awake and communicative today, but he remains significantly confused, with impaired cognition and insight, he is alert x1 . Symmetrical Chest wall movement, Good air movement bilaterally, CTAB RRR,No Gallops,Rubs or new Murmurs, No Parasternal Heave +ve B.Sounds, Abd Soft, No tenderness, No rebound - guarding or rigidity. No Cyanosis, Clubbing or edema, No new Rash or bruise    upper extremity bruise      Objective: Vitals:   03/11/20 2340 03/12/20 0416 03/12/20 0719 03/12/20 1138  BP: 103/68 121/68 124/79 113/76  Pulse: (!) 107 96 99 100  Resp: 20 (!) 23 20 15   Temp: 98.3 F (36.8 C) 98.3 F (36.8 C) 98.9 F (37.2 C) 98.7 F (37.1 C)  TempSrc: Oral  Oral Oral  SpO2: 92% 91% 93% 97%  Weight:      Height:        Intake/Output Summary (Last 24 hours) at 03/12/2020 1210 Last data filed at 03/12/2020 0847 Gross per 24 hour  Intake 970 ml  Output 550 ml  Net 420 ml   Filed Weights   03/02/20  1146 03/04/20 0318  Weight: 99.8 kg 97.2 kg     Data Reviewed:   CBC: Recent Labs  Lab 03/08/20 0140 03/09/20 1725 03/10/20 0318 03/11/20 0311 03/12/20 0141  WBC 5.9 7.8 6.8 6.1 7.2  HGB 9.7* 8.3* 7.8* 7.6* 8.2*  HCT 30.7* 26.8* 24.9* 22.8* 24.4*  MCV 98.4 99.3 98.0 96.6 93.5  PLT 102* 126* 123* 134* 133*   Basic Metabolic Panel: Recent Labs  Lab 03/06/20 0107 03/07/20 0138 03/08/20 0140 03/09/20 0432 03/10/20 0318 03/11/20 0311 03/12/20 0141  NA 150* 147* 145 141 141 139 138  K 3.7 3.8 3.1* 3.4* 3.8 3.8 3.6  CL 117* 113* 110 109 109 106 106  CO2 GLUCOSE 66* 113* 127* 128* 104* 107* 117*  BUN 34* CREATININE 1.28* 1.30* 1.18 1.07 1.09 1.15 1.32*  CALCIUM 8.9 8.3* 7.9* 8.0* 8.1* 8.2* 8.0*   MG 1.8 1.6* 2.0  --   --  1.5*  --    GFR: Estimated Creatinine Clearance: 54.6 mL/min (A) (by C-G formula based on SCr of 1.32 mg/dL (H)). Liver Function Tests: Recent Labs  Lab 03/10/20 0318  AST 45*  ALT 62*  ALKPHOS 105  BILITOT 0.7  PROT 5.0*  ALBUMIN 1.6*   No results for input(s): LIPASE, AMYLASE in the last 168 hours. Recent Labs  Lab 03/08/20 1619  AMMONIA 34   Coagulation Profile: No results for input(s): INR, PROTIME in the last 168 hours. Cardiac Enzymes: No results for input(s): CKTOTAL, CKMB, CKMBINDEX, TROPONINI in the last 168 hours. BNP (last 3 results) No results for input(s): PROBNP in the last 8760 hours. HbA1C: No results for input(s): HGBA1C in the last 72 hours. CBG: Recent Labs  Lab 03/11/20 1218 03/11/20 1747 03/11/20 2208 03/12/20 0718 03/12/20 1138  GLUCAP 112* 114* 95 94 122*   Lipid Profile: No results for input(s): CHOL, HDL, LDLCALC, TRIG, CHOLHDL, LDLDIRECT in the last 72 hours. Thyroid Function Tests: No results for input(s): TSH, T4TOTAL, FREET4, T3FREE, THYROIDAB in the last 72 hours. Anemia Panel: Recent Labs    03/11/20 0311  VITAMINB12 469  FOLATE 7.7  FERRITIN 335  TIBC 143*  IRON 37*  RETICCTPCT 2.4   Sepsis Labs: Recent Labs  Lab 03/09/20 1725 03/10/20 0318  PROCALCITON <0.10 <0.10  LATICACIDVEN 2.1*  --     Recent Results (from the past 240 hour(s))  Culture, blood (routine x 2)     Status: None   Collection Time: 03/03/20  4:22 AM   Specimen: BLOOD  Result Value Ref Range Status   Specimen Description BLOOD SITE NOT SPECIFIED  Final   Special Requests   Final    BOTTLES DRAWN AEROBIC ONLY Blood Culture results may not be optimal due to an inadequate volume of blood received in culture bottles   Culture   Final    NO GROWTH 5 DAYS Performed at [facility], [address], [place], [place] [address]    Report Status 03/08/2020 FINAL  Final  MRSA PCR Screening     Status: Abnormal    Collection Time: 03/03/20  8:47 PM   Specimen: Nasopharyngeal  Result Value Ref Range Status   MRSA by PCR POSITIVE (A) NEGATIVE Final    Comment:        The GeneXpert MRSA Assay (FDA approved for NASAL specimens only), is one component of a comprehensive MRSA colonization surveillance program. It is not intended to diagnose  MRSA infection nor to guide or monitor treatment for MRSA infections. RESULT CALLED TO, READ BACK BY AND VERIFIED WITH: [facility] RN 2249 03/03/2020 MITCHELL,L Performed at [facility], [address], [place], [place] [address]   MRSA PCR Screening     Status: Abnormal   Collection Time: 03/08/20  8:47 AM  Result Value Ref Range Status   MRSA by PCR POSITIVE (A) NEGATIVE Final    Comment:        The GeneXpert MRSA Assay (FDA approved for NASAL specimens only), is one component of a comprehensive MRSA colonization surveillance program. It is not intended to diagnose MRSA infection nor to guide or monitor treatment for MRSA infections. RESULT CALLED TO, READ BACK BY AND VERIFIED WITH: Timoteo Expose RN 12:40 03/08/20 ([email or web address]) Performed at [facility], [address] [place], [place] [address]          Radiology Studies: No results found.      Scheduled Meds: . apixaban  5 mg Oral BID  . budesonide  1 puff Inhalation BID  . feeding supplement  237 mL Oral BID BM  . folic acid  2 mg Oral Daily  . influenza vaccine adjuvanted  0.5 mL Intramuscular Tomorrow-1000  . insulin aspart  0-15 Units Subcutaneous TID WC  . insulin aspart  0-5 Units Subcutaneous QHS  . Ipratropium-Albuterol  1 puff Inhalation TID  . lactobacillus  1 g Oral TID WC  . pantoprazole  40 mg Oral Daily  . QUEtiapine  25 mg Oral QHS   Continuous Infusions: . thiamine injection 500 mg (03/12/20 0833)     LOS: 10 days     Huey Bienenstock, MD [facility]  If 7PM-7AM, please contact night-coverage  03/12/2020, 12:10 PM

## 2020-03-13 DIAGNOSIS — R4182 Altered mental status, unspecified: Secondary | ICD-10-CM | POA: Diagnosis not present

## 2020-03-13 DIAGNOSIS — N183 Chronic kidney disease, stage 3 unspecified: Secondary | ICD-10-CM

## 2020-03-13 DIAGNOSIS — A419 Sepsis, unspecified organism: Secondary | ICD-10-CM | POA: Diagnosis not present

## 2020-03-13 DIAGNOSIS — E1165 Type 2 diabetes mellitus with hyperglycemia: Secondary | ICD-10-CM | POA: Diagnosis not present

## 2020-03-13 DIAGNOSIS — N179 Acute kidney failure, unspecified: Secondary | ICD-10-CM | POA: Diagnosis not present

## 2020-03-13 LAB — GLUCOSE, CAPILLARY
Glucose-Capillary: 90 mg/dL (ref 70–99)
Glucose-Capillary: 96 mg/dL (ref 70–99)
Glucose-Capillary: 87 mg/dL (ref 70–99)
Glucose-Capillary: 164 mg/dL — ABNORMAL HIGH (ref 70–99)

## 2020-03-13 MED ORDER — MOMETASONE FURO-FORMOTEROL FUM 200-5 MCG/ACT IN AERO
2.0000 | INHALATION_SPRAY | Freq: Two times a day (BID) | RESPIRATORY_TRACT | Status: DC
  Administered 2020-03-14 – 2020-03-15 (×3): 2 via RESPIRATORY_TRACT
  Filled 2020-03-13: qty 8.8

## 2020-03-13 NOTE — Progress Notes (Signed)
Pt has mittens on unable to place pt on cpap.

## 2020-03-13 NOTE — NC FL2 (Signed)
[place] MEDICAID FL2 LEVEL OF CARE SCREENING TOOL     IDENTIFICATION  Patient Name: Chad Duke Birthdate: [birth year] Sex: male Admission Date (Current Location): 03/02/2020  [facility] and [place] Number:  Producer, television/film/video and Address:  [facility], [address] [place], [place] [address]      Provider Number: [id number]  Attending Physician Name and Address:  Maretta Bees, MD  Relative Name and Phone Number:  Trudie Buckler niece, [phone]    Current Level of Care: Hospital Recommended Level of Care: Skilled Nursing Facility Prior Approval Number:    Date Approved/Denied:   PASRR Number: [id number]  Discharge Plan: SNF    Current Diagnoses: Patient Active Problem List   Diagnosis Date Noted  . Severe sepsis (HCC) 03/02/2020  . Parotitis 03/02/2020  . Chronic renal disease, stage III (HCC) 01/25/2018  . Weakness 10/03/2017  . Adult failure to thrive 10/03/2017  . Positive D dimer 09/26/2017  . Elevated troponin 09/26/2017  . Personal history of DVT (deep vein thrombosis) 09/26/2017  . COPD with acute exacerbation (HCC) 09/26/2017  . CAD (coronary artery disease) 09/08/2017  . Goals of care, counseling/discussion   . Palliative care by specialist   . Acute kidney injury superimposed on chronic kidney disease (HCC) 09/02/2017  . Hypokalemia 09/02/2017  . Hypomagnesemia 09/02/2017  . Controlled type 2 diabetes mellitus with hyperglycemia (HCC) 09/02/2017  . Urinary incontinence 09/02/2017  . Fall 08/13/2017  . Acute renal failure superimposed on stage 3 chronic kidney disease (HCC) 08/13/2017  . HLD (hyperlipidemia) 08/13/2017  . Essential hypertension 08/13/2017  . GERD (gastroesophageal reflux disease) 08/13/2017  . Orthostatic hypotension 08/13/2017  . Deep vein thrombosis (DVT) of both lower extremities (HCC)   . Hyperglycemia   . History of pulmonary embolism 07/20/2017  . Acute on chronic diastolic heart failure (HCC)  23/55/7322  . Bilateral leg edema 07/20/2017  . Acute exacerbation of chronic obstructive pulmonary disease (COPD) (HCC) 07/19/2017  . 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 Cigarette smoker 09/08/2016  . Morbid obesity due to excess calories (HCC) 09/08/2016  . Pain in joint, ankle and foot 03/31/2012  . Abnormality of gait 03/31/2012  . Rheumatoid arthritis (HCC) 03/31/2012  . COPD GOLD III  02/12/2011  . Sleep apnea 02/12/2011    Orientation RESPIRATION BLADDER Height & Weight     Self  Normal External catheter,Incontinent Weight: 214 lb 4.6 oz (97.2 kg) Height:  5\' 8"  (172.7 cm)  BEHAVIORAL SYMPTOMS/MOOD NEUROLOGICAL BOWEL NUTRITION STATUS      Incontinent Diet (See DC summary)  AMBULATORY STATUS COMMUNICATION OF NEEDS Skin   Extensive Assist Verbally Other (Comment) (Left heel deep pressure injury, non-pressure wound pre-tibial right,)                       Personal Care Assistance Level of Assistance  Bathing,Dressing,Feeding Bathing Assistance: Maximum assistance Feeding assistance: Limited assistance Dressing Assistance: Maximum assistance     Functional Limitations Info  Sight,Hearing,Speech Sight Info: Adequate Hearing Info: Adequate Speech Info: Impaired (Slurred speech)    SPECIAL CARE FACTORS FREQUENCY  PT (By licensed PT),OT (By licensed OT)     PT Frequency: 5X per week OT Frequency: 5X per week            Contractures Contractures Info: Not present    Additional Factors Info  Code Status,Allergies,Isolation Precautions,Insulin Sliding Scale,Psychotropic Code Status Info: DNR Allergies Info: Varenicline, Gabapentin Psychotropic Info: Seroquel Insulin Sliding Scale Info: See DC summary  Isolation Precautions Info: COVID+, MRSA     Current Medications (03/13/2020):  This is the current hospital active medication list Current Facility-Administered Medications  Medication Dose Route Frequency Provider Last Rate Last Admin  . acetaminophen  (TYLENOL) tablet 650 mg  650 mg Oral Q6H PRN Amin, Ankit Chirag, MD      . albuterol (VENTOLIN HFA) 108 (90 Base) MCG/ACT inhaler 1 puff  1 puff Inhalation Q2H PRN Calvert Cantor, MD   1 puff at 03/13/20 [phone]  . apixaban (ELIQUIS) tablet 5 mg  5 mg Oral BID Elgergawy, Leana Roe, MD   5 mg at 03/13/20 4496  . budesonide (PULMICORT) 180 MCG/ACT inhaler 1 puff  1 puff Inhalation BID Calvert Cantor, MD   1 puff at 03/13/20 0939  . dextromethorphan-guaiFENesin (MUCINEX DM) 30-600 MG per 12 hr tablet 1 tablet  1 tablet Oral BID PRN Amin, Ankit Chirag, MD      . feeding supplement (ENSURE ENLIVE / ENSURE PLUS) liquid 237 mL  237 mL Oral BID BM Amin, Ankit Chirag, MD   237 mL at 03/13/20 0939  . folic acid (FOLVITE) tablet 2 mg  2 mg Oral Daily Elgergawy, Leana Roe, MD   2 mg at 03/13/20 [phone]  . influenza vaccine adjuvanted (FLUAD) injection 0.5 mL  0.5 mL Intramuscular Tomorrow-1000 Elgergawy, Dawood S, MD      . insulin aspart (novoLOG) injection 0-15 Units  0-15 Units Subcutaneous TID WC Amin, Loura Halt, MD   2 Units at 03/12/20 1725  . insulin aspart (novoLOG) injection 0-5 Units  0-5 Units Subcutaneous QHS Amin, Ankit Chirag, MD      . Ipratropium-Albuterol (COMBIVENT) respimat 1 puff  1 puff Inhalation TID Dimple Nanas, MD   1 puff at 03/13/20 0939  . lactobacillus (FLORANEX/LACTINEX) granules 1 g  1 g Oral TID WC Elgergawy, Leana Roe, MD   1 g at 03/13/20 [phone]  . ondansetron (ZOFRAN) tablet 4 mg  4 mg Oral Q6H PRN Calvert Cantor, MD       Or  . ondansetron (ZOFRAN) injection 4 mg  4 mg Intravenous Q6H PRN Rizwan, Saima, MD      . pantoprazole (PROTONIX) EC tablet 40 mg  40 mg Oral Daily Elgergawy, Leana Roe, MD   40 mg at 03/13/20 [phone]  . QUEtiapine (SEROQUEL) tablet 25 mg  25 mg Oral QHS Elgergawy, Leana Roe, MD   25 mg at 03/12/20 2337  . senna-docusate (Senokot-S) tablet 1 tablet  1 tablet Oral QHS PRN Amin, Ankit Chirag, MD      . thiamine 500mg  in normal saline (40ml) IVPB  500 mg Intravenous  TID 45m, MD 100 mL/hr at 03/13/20 0940 500 mg at 03/13/20 0940  . traMADol (ULTRAM) tablet 50 mg  50 mg Oral Q6H PRN Mansy, 05/11/20, MD         Discharge Medications: Please see discharge summary for a list of discharge medications.  Relevant Imaging Results:  Relevant Lab Results:   Additional Information SSN: Vernetta Honey  [id number], Student-Social Work

## 2020-03-13 NOTE — Progress Notes (Addendum)
PROGRESS NOTE    Chad Duke  NWG:[id number] DOB: 05-31-1945 DOA: 03/02/2020 PCP: Soundra Pilon, FNP   Brief Narrative:  75 year old residing at [facility] health care, chronic diastolic CHF, HTN, AAA, PE, rheumatoid arthritis, hypogonadism, depression, COPD on chronic 2 L nasal cannula, OSA, HLD was sent to the hospital for poor oral intake and left-sided facial swelling concerning for parotitis.  Patient was diagnosed of COVID 2 weeks ago at [facility] and was treated at the facility.  Patient was seen by ENT in the ER started on Decadron; warm compresses and Unasyn.  Seen by speech therapist, started on dysphagia one diet.  PT is recommending SNF, TOC team aware.  Significant studies: 1/22>> chest x-ray: No active disease 1/22>> CT C-spine: No acute traumatic injury to C-spine. 1/22>> CT head: No acute intracranial abnormality. 1/22>> CT maxillofacial: Anterior dislocation of the left mandibular condyle/extensive soft tissue swelling. 1/26>> MRI brain: No acute intracranial abnormality. 1/28>> ammonia: Normal 1/28>> TSH: Normal 1/28>> RPR: Nonreactive 1/29>> EEG: No seizures. 1/31>> vitamin B12: 469  Antibiotics: Unasyn: 1/22>> 1/31 Vancomycin: 1/22 x 1  Microbiology: 1/22>> urine culture: Negative 1/22>> blood culture: Negative 1/23>> blood culture: Negative  DVT prophylaxis: Eliquis  Subjective: Still lethargic and sleeping but awakes easily.  Following simple commands.  Assessment & Plan: Severe sepsis secondary to left parotitis: Sepsis physiology has resolved-no significant left facial swelling observed-has completed a course of Unasyn, and Decadron.  Subluxation of left mandibular condyle: No inpatient work-up recommended-outpatient follow-up with ENT  Acute metabolic encephalopathy: Slowly improving-maintain delirium precautions-work-up as above.  Neurology following.  Remains on Seroquel and high-dose thiamine x3 days.  AKI on CKD stage III: AKI hemodynamically  mediated-improved-creatinine close to baseline.  Anemia: Secondary to acute illness-superimposed on anemia due to kidney disease-no overt bleeding-follow.  Thrombocytopenia: Could be due to COVID-19 infection-slowly improving.  COPD with chronic hypoxemic respiratory failure on 2-3 L of oxygen at home: Stable-continue bronchodilators  History of PE: On Eliquis  History of rheumatoid arthritis: On Remicade infusion as outpatient.  DM-2: CBG stable on SSI.  Resume Metformin on discharge  Recent Labs    03/12/20 2357 03/13/20 0719 03/13/20 1214  GLUCAP 154* 90 96    Recent COVID-19 infection: Asymptomatic-no major issues.  Code Status: DNR  Family Communication:  Niece Heidi-[phone]-updated on 2/2  Status is: Inpatient  Remains inpatient appropriate because:IV treatments appropriate due to intensity of illness or inability to take PO   Dispo: The patient is from: SNF              Anticipated d/c is to: SNF              Anticipated d/c date is: 3 days              Patient currently is not medically stable to d/c.  Remains significantly altered.     Difficult to place patient No   Body mass index is 32.58 kg/m.  Pressure Injury 03/06/20 Heel Left Deep Tissue Pressure Injury - Purple or maroon localized area of discolored intact skin or blood-filled blister due to damage of underlying soft tissue from pressure and/or shear. (Active)  03/06/20 1932  Location: Heel  Location Orientation: Left  Staging: Deep Tissue Pressure Injury - Purple or maroon localized area of discolored intact skin or blood-filled blister due to damage of underlying soft tissue from pressure and/or shear.  Wound Description (Comments):   Present on Admission:      Examination: Gen Exam: Sleepy/lethargic-but awakes-answer simple questions appropriately.  Elderly/frail appearing. HEENT:atraumatic, normocephalic Chest: B/L clear to auscultation anteriorly CVS:S1S2 regular Abdomen:soft non  tender, non distended Extremities:no edema Neurology: Non focal-but with generalized weakness. Skin: no rash   Objective: Vitals:   03/12/20 2350 03/13/20 0413 03/13/20 0720 03/13/20 1149  BP: 135/78 91/74 (!) 114/91 118/72  Pulse: 100 98 94 94  Resp: 17 20 19  (!) 21  Temp: 98.4 F (36.9 C) 98.4 F (36.9 C) 97.6 F (36.4 C) 98.7 F (37.1 C)  TempSrc: Axillary Axillary Axillary Axillary  SpO2: 95% 92% 90% 92%  Weight:      Height:        Intake/Output Summary (Last 24 hours) at 03/13/2020 1345 Last data filed at 03/13/2020 8677 Gross per 24 hour  Intake 120 ml  Output 700 ml  Net -580 ml   Filed Weights   03/02/20 1146 03/04/20 0318  Weight: 99.8 kg 97.2 kg     Data Reviewed:   CBC: Recent Labs  Lab 03/08/20 0140 03/09/20 1725 03/10/20 0318 03/11/20 0311 03/12/20 0141  WBC 5.9 7.8 6.8 6.1 7.2  HGB 9.7* 8.3* 7.8* 7.6* 8.2*  HCT 30.7* 26.8* 24.9* 22.8* 24.4*  MCV 98.4 99.3 98.0 96.6 93.5  PLT 102* 126* 123* 134* 133*   Basic Metabolic Panel: Recent Labs  Lab 03/07/20 0138 03/08/20 0140 03/09/20 0432 03/10/20 0318 03/11/20 0311 03/12/20 0141  NA 147* 145 141 141 139 138  K 3.8 3.1* 3.4* 3.8 3.8 3.6  CL 113* 110 109 109 106 106  CO2 25 24 25 25 24 24   GLUCOSE 113* 127* 128* 104* 107* 117*  BUN 23 20 15 15 11 13   CREATININE 1.30* 1.18 1.07 1.09 1.15 1.32*  CALCIUM 8.3* 7.9* 8.0* 8.1* 8.2* 8.0*  MG 1.6* 2.0  --   --  1.5*  --    GFR: Estimated Creatinine Clearance: 54.6 mL/min (A) (by C-G formula based on SCr of 1.32 mg/dL (H)). Liver Function Tests: Recent Labs  Lab 03/10/20 0318  AST 45*  ALT 62*  ALKPHOS 105  BILITOT 0.7  PROT 5.0*  ALBUMIN 1.6*   No results for input(s): LIPASE, AMYLASE in the last 168 hours. Recent Labs  Lab 03/08/20 1619  AMMONIA 34   Coagulation Profile: No results for input(s): INR, PROTIME in the last 168 hours. Cardiac Enzymes: No results for input(s): CKTOTAL, CKMB, CKMBINDEX, TROPONINI in the last 168  hours. BNP (last 3 results) No results for input(s): PROBNP in the last 8760 hours. HbA1C: No results for input(s): HGBA1C in the last 72 hours. CBG: Recent Labs  Lab 03/12/20 2044 03/12/20 2056 03/12/20 2357 03/13/20 0719 03/13/20 1214  GLUCAP 69* 74 154* 90 96   Lipid Profile: No results for input(s): CHOL, HDL, LDLCALC, TRIG, CHOLHDL, LDLDIRECT in the last 72 hours. Thyroid Function Tests: No results for input(s): TSH, T4TOTAL, FREET4, T3FREE, THYROIDAB in the last 72 hours. Anemia Panel: Recent Labs    03/11/20 0311  VITAMINB12 469  FOLATE 7.7  FERRITIN 335  TIBC 143*  IRON 37*  RETICCTPCT 2.4   Sepsis Labs: Recent Labs  Lab 03/09/20 1725 03/10/20 0318  PROCALCITON <0.10 <0.10  LATICACIDVEN 2.1*  --     Recent Results (from the past 240 hour(s))  MRSA PCR Screening     Status: Abnormal   Collection Time: 03/03/20  8:47 PM   Specimen: Nasopharyngeal  Result Value Ref Range Status   MRSA by PCR POSITIVE (A) NEGATIVE Final    Comment:  The GeneXpert MRSA Assay (FDA approved for NASAL specimens only), is one component of a comprehensive MRSA colonization surveillance program. It is not intended to diagnose MRSA infection nor to guide or monitor treatment for MRSA infections. RESULT CALLED TO, READ BACK BY AND VERIFIED WITH: [facility] RN 2249 03/03/2020 MITCHELL,L Performed at [facility], [address] [place], [place] [address]   MRSA PCR Screening     Status: Abnormal   Collection Time: 03/08/20  8:47 AM  Result Value Ref Range Status   MRSA by PCR POSITIVE (A) NEGATIVE Final    Comment:        The GeneXpert MRSA Assay (FDA approved for NASAL specimens only), is one component of a comprehensive MRSA colonization surveillance program. It is not intended to diagnose MRSA infection nor to guide or monitor treatment for MRSA infections. RESULT CALLED TO, READ BACK BY AND VERIFIED WITH: Timoteo Expose RN 12:40 03/08/20  ([email or web address]) Performed at [facility], [address] [place], [place] [address]          Radiology Studies: No results found.      Scheduled Meds: . apixaban  5 mg Oral BID  . budesonide  1 puff Inhalation BID  . feeding supplement  237 mL Oral BID BM  . folic acid  2 mg Oral Daily  . influenza vaccine adjuvanted  0.5 mL Intramuscular Tomorrow-1000  . insulin aspart  0-15 Units Subcutaneous TID WC  . insulin aspart  0-5 Units Subcutaneous QHS  . Ipratropium-Albuterol  1 puff Inhalation TID  . lactobacillus  1 g Oral TID WC  . pantoprazole  40 mg Oral Daily  . QUEtiapine  25 mg Oral QHS   Continuous Infusions: . thiamine injection 500 mg (03/13/20 0940)     LOS: 11 days     Jeoffrey Massed, MD [facility]  If 7PM-7AM, please contact night-coverage  03/13/2020, 1:45 PM

## 2020-03-13 NOTE — Progress Notes (Signed)
  Speech Language Pathology Treatment: Dysphagia  Patient Details Name: Chad Duke MRN: [id number] DOB: 06-25-1945 Today's Date: 03/13/2020 Time: 4765-4650 SLP Time Calculation (min) (ACUTE ONLY): 11 min  Assessment / Plan / Recommendation Clinical Impression  Pt was seen for dysphagia treatment. He was more lethargic than during the last session with this SLP, but notably more alert than when he was last seen by speech pathology. Pt's NT reported that the pt inconsistently exhibited coughing with ground meat this morning, but tolerated other solids and liquids without difficulty. Pt tolerated dysphagia 2 solids without overt s/sx of aspiration. He exhibited a munching masticatory pattern with dysphagia 3 solids and  pocketing to the left which was eliminated with a liquid wash. Pt inconsistently exhibited coughing with consecutive swallows of thin liquids via straw, but this was eliminated with individual sips. Pt was not symptomatic of aspiration when last seen by SLP with p.o. trials and the impact of his lethargy on his performance is therefore questioned. SLP will follow to assess persistence of symptoms as well as the need for instrumental assessment.    HPI HPI: Chad Duke is a 75 y/o M with recent history of Covid 19-infection, with AMS, dehydration and left acute parotitis, poor oral intake. Per ENT- No acute intervention of anteriorly subluxed left mandible. Difficult to determine if this is secondary to severe edema or from recent dislocation. ENT requested heated compress, massage and oral hydration when appropriate. Admitted for treatment of severe sepsis secondary to L parotitis and acute metabolic encephalopathy.      SLP Plan  Continue with current plan of care       Recommendations  Diet recommendations: Dysphagia 2 (fine chop);Thin liquid Liquids provided via: Cup;Straw Medication Administration: Whole meds with puree Supervision: Staff to assist with self  feeding Compensations: Slow rate;Small sips/bites                Oral Care Recommendations: Oral care BID Follow up Recommendations: Skilled Nursing facility;24 hour supervision/assistance SLP Visit Diagnosis: Dysphagia, unspecified (R13.10) Plan: Continue with current plan of care       Phylliss Strege I. Vear Clock, MS, CCC-SLP Acute Rehabilitation Services Office number [phone] Pager [phone]                [name] 03/13/2020, 1:10 PM

## 2020-03-13 NOTE — Discharge Instructions (Signed)
.   AVS Information on my medicine - ELIQUIS (apixaban)  This medication education was reviewed with me or my healthcare representative as part of my discharge preparation.  The pharmacist that spoke with me during my hospital stay was:  Ulyses Southward, RPH-CPP  Why was Eliquis prescribed for you? Eliquis was prescribed to treat blood clots that may have been found in the veins of your legs (deep vein thrombosis) or in your lungs (pulmonary embolism) and to reduce the risk of them occurring again.  What do You need to know about Eliquis ? Continue apixaban 5mg  TWICE daily.  Eliquis may be taken with or without food.   Try to take the dose about the same time in the morning and in the evening. If you have difficulty swallowing the tablet whole please discuss with your pharmacist how to take the medication safely.  Take Eliquis exactly as prescribed and DO NOT stop taking Eliquis without talking to the doctor who prescribed the medication.  Stopping may increase your risk of developing a new blood clot.  Refill your prescription before you run out.  After discharge, you should have regular check-up appointments with your healthcare provider that is prescribing your Eliquis.    What do you do if you miss a dose? If a dose of ELIQUIS is not taken at the scheduled time, take it as soon as possible on the same day and twice-daily administration should be resumed. The dose should not be doubled to make up for a missed dose.  Important Safety Information A possible side effect of Eliquis is bleeding. You should call your healthcare provider right away if you experience any of the following: ? Bleeding from an injury or your nose that does not stop. ? Unusual colored urine (red or dark brown) or unusual colored stools (red or black). ? Unusual bruising for unknown reasons. ? A serious fall or if you hit your head (even if there is no bleeding).  Some medicines may interact with Eliquis and  might increase your risk of bleeding or clotting while on Eliquis. To help avoid this, consult your healthcare provider or pharmacist prior to using any new prescription or non-prescription medications, including herbals, vitamins, non-steroidal anti-inflammatory drugs (NSAIDs) and supplements.  This website has more information on Eliquis (apixaban): http://www.eliquis.com/eliquis/home

## 2020-03-13 NOTE — Plan of Care (Signed)
  Problem: Education: Goal: Knowledge of General Education information will improve Description: Including pain rating scale, medication(s)/side effects and non-pharmacologic comfort measures Outcome: Progressing   Problem: Health Behavior/Discharge Planning: Goal: Ability to manage health-related needs will improve Outcome: Progressing   Problem: Clinical Measurements: Goal: Ability to maintain clinical measurements within normal limits will improve Outcome: Progressing Goal: Will remain free from infection Outcome: Progressing Goal: Diagnostic test results will improve Outcome: Progressing Goal: Respiratory complications will improve Outcome: Progressing Goal: Cardiovascular complication will be avoided Outcome: Progressing   Problem: Activity: Goal: Risk for activity intolerance will decrease Outcome: Progressing   Problem: Nutrition: Goal: Adequate nutrition will be maintained Outcome: Progressing   Problem: Coping: Goal: Level of anxiety will decrease Outcome: Progressing   Problem: Elimination: Goal: Will not experience complications related to bowel motility Outcome: Progressing Goal: Will not experience complications related to urinary retention Outcome: Progressing   Problem: Pain Managment: Goal: General experience of comfort will improve Outcome: Progressing   Problem: Safety: Goal: Ability to remain free from injury will improve Outcome: Progressing   Problem: Skin Integrity: Goal: Risk for impaired skin integrity will decrease Outcome: Progressing   Problem: Education: Goal: Knowledge of risk factors and measures for prevention of condition will improve Outcome: Progressing   Problem: Coping: Goal: Psychosocial and spiritual needs will be supported Outcome: Progressing   Problem: Respiratory: Goal: Will maintain a patent airway Outcome: Progressing Goal: Complications related to the disease process, condition or treatment will be avoided or  minimized Outcome: Progressing   Problem: Safety: Goal: Non-violent Restraint(s) Outcome: Progressing   

## 2020-03-14 ENCOUNTER — Inpatient Hospital Stay (HOSPITAL_COMMUNITY): Payer: Medicare Other

## 2020-03-14 LAB — COMPREHENSIVE METABOLIC PANEL
ALT: 36 U/L (ref 0–44)
AST: 29 U/L (ref 15–41)
Albumin: 1.6 g/dL — ABNORMAL LOW (ref 3.5–5.0)
Alkaline Phosphatase: 92 U/L (ref 38–126)
Anion gap: 7 (ref 5–15)
BUN: 15 mg/dL (ref 8–23)
CO2: 24 mmol/L (ref 22–32)
Calcium: 8.5 mg/dL — ABNORMAL LOW (ref 8.9–10.3)
Chloride: 106 mmol/L (ref 98–111)
Creatinine, Ser: 1.37 mg/dL — ABNORMAL HIGH (ref 0.61–1.24)
GFR, Estimated: 54 mL/min — ABNORMAL LOW (ref >60)
Glucose, Bld: 106 mg/dL — ABNORMAL HIGH (ref 70–99)
Potassium: 3.9 mmol/L (ref 3.5–5.1)
Sodium: 137 mmol/L (ref 135–145)
Total Bilirubin: 0.8 mg/dL (ref 0.3–1.2)
Total Protein: 5.1 g/dL — ABNORMAL LOW (ref 6.5–8.1)

## 2020-03-14 LAB — CBC
HCT: 25.3 % — ABNORMAL LOW (ref 39.0–52.0)
Hemoglobin: 8.1 g/dL — ABNORMAL LOW (ref 13.0–17.0)
MCH: 30.7 pg (ref 26.0–34.0)
MCHC: 32 g/dL (ref 30.0–36.0)
MCV: 95.8 fL (ref 80.0–100.0)
Platelets: 154 10*3/uL (ref 150–400)
RBC: 2.64 MIL/uL — ABNORMAL LOW (ref 4.22–5.81)
RDW: 18.6 % — ABNORMAL HIGH (ref 11.5–15.5)
WBC: 7.1 10*3/uL (ref 4.0–10.5)
nRBC: 0 % (ref 0.0–0.2)

## 2020-03-14 LAB — GLUCOSE, CAPILLARY
Glucose-Capillary: 96 mg/dL (ref 70–99)
Glucose-Capillary: 91 mg/dL (ref 70–99)
Glucose-Capillary: 109 mg/dL — ABNORMAL HIGH (ref 70–99)
Glucose-Capillary: 215 mg/dL — ABNORMAL HIGH (ref 70–99)

## 2020-03-14 LAB — TROPONIN I (HIGH SENSITIVITY): Troponin I (High Sensitivity): 25 ng/L — ABNORMAL HIGH (ref <18)

## 2020-03-14 MED ORDER — QUETIAPINE FUMARATE 25 MG PO TABS: 25.0000 mg | ORAL_TABLET | Freq: Every day | ORAL | Status: AC

## 2020-03-14 MED ORDER — ENSURE ENLIVE PO LIQD
237.0000 mL | Freq: Three times a day (TID) | ORAL | Status: DC
  Administered 2020-03-14 – 2020-03-15 (×2): 237 mL via ORAL

## 2020-03-14 MED ORDER — SODIUM CHLORIDE 0.9 % IV SOLN: INTRAVENOUS | Status: AC

## 2020-03-14 MED ORDER — SODIUM CHLORIDE 0.9 % IV SOLN
500.0000 mg | INTRAVENOUS | Status: DC
  Administered 2020-03-14: 500 mg via INTRAVENOUS
  Filled 2020-03-14 (×2): qty 500

## 2020-03-14 MED ORDER — SODIUM CHLORIDE 0.9 % IV SOLN
2.0000 g | INTRAVENOUS | Status: DC
  Administered 2020-03-14: 2 g via INTRAVENOUS
  Filled 2020-03-14: qty 20

## 2020-03-14 MED ORDER — FOLIC ACID 1 MG PO TABS: 2.0000 mg | ORAL_TABLET | Freq: Every day | ORAL | Status: DC

## 2020-03-14 MED ORDER — INSULIN ASPART 100 UNIT/ML ~~LOC~~ SOLN: SUBCUTANEOUS | 11 refills | Status: DC

## 2020-03-14 MED ORDER — SODIUM CHLORIDE 0.9 % IV SOLN: 2.0000 g | INTRAVENOUS | Status: DC

## 2020-03-14 NOTE — Progress Notes (Signed)
   03/13/20 1951  Assess: MEWS Score  Temp 98 F (36.7 C)  BP 109/84  Pulse Rate (!) 115  ECG Heart Rate (!) 115  Resp 16  Level of Consciousness Alert  SpO2 93 %  O2 Device Room Air  Patient Activity (if Appropriate) In bed  Assess: MEWS Score  MEWS Temp 0  MEWS Systolic 0  MEWS Pulse 2  MEWS RR 0  MEWS LOC 0  MEWS Score 2  MEWS Score Color Yellow  Assess: if the MEWS score is Yellow or Red  Were vital signs taken at a resting state? Yes  Focused Assessment No change from prior assessment  Early Detection of Sepsis Score *See Row Information* Low  MEWS guidelines implemented *See Row Information* Yes  Treat  MEWS Interventions Administered scheduled meds/treatments  Pain Scale PAINAD  Pain Score 0  Breathing 0  Negative Vocalization 0  Notify: Charge Nurse/RN  Name of Charge Nurse/RN Notified Psychologist, forensic notfied; protcol in place

## 2020-03-14 NOTE — Progress Notes (Signed)
Nutrition Follow-up  DOCUMENTATION CODES:   Obesity unspecified  INTERVENTION:  Ensure Enlive po TID, each supplement provides 350 kcal and 20 grams of protein   NUTRITION DIAGNOSIS:   Increased nutrient needs related to catabolic illness (COVID+) as evidenced by estimated needs; ongoing  GOAL:   Patient will meet greater than or equal to 90% of their needs; progressing  MONITOR:   PO intake,Supplement acceptance,Skin,Weight trends,Labs,I & O's  REASON FOR ASSESSMENT:   Malnutrition Screening Tool    ASSESSMENT:   75 year old residing at [facility] health care, chronic diastolic CHF, HTN, AAA, PE, rheumatoid arthritis, hypogonadism, depression, COPD on chronic 2 L nasal cannula, OSA, HLD was sent to the hospital for poor oral intake and left-sided facial swelling concerning for parotitis. Pt COVID positive. Pt with Severe sepsis secondary to left parotitis.  Pt pending d/c back to SNF. Pt continues on a dysphagia 2 diet with thin liquids. Meal completion has been 10-100% x last 8 recorded meals (37% average meal intake). Pt currently has Ensure BID ordered and has been consuming most of them. RD to provide pt with additional supplement given po intake has decreased since last assessment.   UOP: 1L x24 hours  Labs: CBGs 91-96 Medications: folvite, ss novolog TID with meals and bedtime  Diet Order:   Diet Order            Diet - low sodium heart healthy           Diet Carb Modified           DIET DYS 2 Room service appropriate? Yes with Assist; Fluid consistency: Thin  Diet effective now                 EDUCATION NEEDS:   Not appropriate for education at this time  Skin:  Skin Assessment: Skin Integrity Issues: Skin Integrity Issues:: DTI DTI: L heel  Last BM:  1/31  Height:   Ht Readings from Last 1 Encounters:  03/02/20 5\' 8"  (1.727 m)    Weight:   Wt Readings from Last 1 Encounters:  03/04/20 97.2 kg    BMI:  Body mass index is 32.58  kg/m.  Estimated Nutritional Needs:   Kcal:  2000-2200  Protein:  105-120 grams  Fluid:  >/= 2 L/day  03/06/20, MS, RD, LDN RD pager number and weekend/on-call pager number located in Amion.

## 2020-03-14 NOTE — Plan of Care (Signed)
  Problem: Education: Goal: Knowledge of General Education information will improve Description: Including pain rating scale, medication(s)/side effects and non-pharmacologic comfort measures Outcome: Progressing   Problem: Health Behavior/Discharge Planning: Goal: Ability to manage health-related needs will improve Outcome: Progressing   Problem: Clinical Measurements: Goal: Ability to maintain clinical measurements within normal limits will improve Outcome: Progressing Goal: Will remain free from infection Outcome: Progressing Goal: Diagnostic test results will improve Outcome: Progressing Goal: Respiratory complications will improve Outcome: Progressing Goal: Cardiovascular complication will be avoided Outcome: Progressing   Problem: Activity: Goal: Risk for activity intolerance will decrease Outcome: Progressing   Problem: Nutrition: Goal: Adequate nutrition will be maintained Outcome: Progressing   Problem: Coping: Goal: Level of anxiety will decrease Outcome: Progressing   Problem: Elimination: Goal: Will not experience complications related to bowel motility Outcome: Progressing Goal: Will not experience complications related to urinary retention Outcome: Progressing   Problem: Pain Managment: Goal: General experience of comfort will improve Outcome: Progressing   Problem: Safety: Goal: Ability to remain free from injury will improve Outcome: Progressing   Problem: Skin Integrity: Goal: Risk for impaired skin integrity will decrease Outcome: Progressing   Problem: Education: Goal: Knowledge of risk factors and measures for prevention of condition will improve Outcome: Progressing   Problem: Coping: Goal: Psychosocial and spiritual needs will be supported Outcome: Progressing   Problem: Respiratory: Goal: Will maintain a patent airway Outcome: Progressing Goal: Complications related to the disease process, condition or treatment will be avoided or  minimized Outcome: Progressing   Problem: Safety: Goal: Non-violent Restraint(s) Outcome: Progressing   

## 2020-03-14 NOTE — Progress Notes (Signed)
  Speech Language Pathology Treatment: Dysphagia  Patient Details Name: Chad Duke MRN: [id number] DOB: 1945/08/23 Today's Date: 03/14/2020 Time: 4707-6151 SLP Time Calculation (min) (ACUTE ONLY): 13 min  Assessment / Plan / Recommendation Clinical Impression  Pt was seen for dysphagia treatment. He was alert and cooperative during the session and presented with a baseline cough. Pt was seen during lunch and consumed a meal of ground roast pork, sweet potato puree, and collard greens. No s/sx of aspiration were noted with solids or with thin liquids via straw. Mastication was functional and no significant oral residue was noted. It is recommended that his current diet be continued. Pt currently has discharge orders. Continued SLP services are recommended at the next level of care for further diet advancement.    HPI HPI: Chad Duke is a 75 y/o M with recent history of Covid 19-infection, with AMS, dehydration and left acute parotitis, poor oral intake. Per ENT- No acute intervention of anteriorly subluxed left mandible. Difficult to determine if this is secondary to severe edema or from recent dislocation. ENT requested heated compress, massage and oral hydration when appropriate. Admitted for treatment of severe sepsis secondary to L parotitis and acute metabolic encephalopathy.      SLP Plan  All goals met;Discharge SLP treatment due to (comment)       Recommendations  Diet recommendations: Dysphagia 2 (fine chop);Thin liquid Liquids provided via: Cup;Straw Medication Administration: Whole meds with puree Supervision: Staff to assist with self feeding Compensations: Slow rate;Small sips/bites                Oral Care Recommendations: Oral care BID Follow up Recommendations: Skilled Nursing facility;24 hour supervision/assistance SLP Visit Diagnosis: Dysphagia, unspecified (R13.10) Plan: All goals met;Discharge SLP treatment due to (comment)       Chad Duke I. Hardin Negus,  [place], [place] Office number [phone] Pager [phone]                Chad Duke 03/14/2020, 2:20 PM

## 2020-03-14 NOTE — TOC Initial Note (Signed)
Transition of Care [facility]) - Initial/Assessment Note    Patient Details  Name: Chad Duke MRN: [id number] Date of Birth: 1945/06/02  Transition of Care [facility] CM/SW Contact:    Mearl Latin, LCSW Phone Number: 03/14/2020, 11:31 AM  Clinical Narrative:                 CSW spoke with patient's niece, Heidi. She is in agreement with patient returning to [facility] today. She stated that they may be in the process of finding him a long term bed soon. CSW will arrange PTAR.  Expected Discharge Plan: Skilled Nursing Facility Barriers to Discharge: No Barriers Identified   Patient Goals and CMS Choice Patient states their goals for this hospitalization and ongoing recovery are:: Return to snf CMS Medicare.gov Compare Post Acute Care list provided to:: Patient Represenative (must comment) Choice offered to / list presented to : [facility] POA / Guardian (Niece)  Expected Discharge Plan and Services Expected Discharge Plan: Skilled Nursing Facility In-house Referral: Clinical Social Work   Post Acute Care Choice: Skilled Nursing Facility Living arrangements for the past 2 months: Skilled Nursing Facility Expected Discharge Date: 03/14/20                                    Prior Living Arrangements/Services Living arrangements for the past 2 months: Skilled Nursing Facility Lives with:: Facility Resident Patient language and need for interpreter reviewed:: Yes Do you feel safe going back to the place where you live?: Yes      Need for Family Participation in Patient Care: Yes (Comment) Care giver support system in place?: Yes (comment)   Criminal Activity/Legal Involvement Pertinent to Current Situation/Hospitalization: No - Comment as needed  Activities of Daily Living Home Assistive Devices/Equipment: None ADL Screening (condition at time of admission) Patient's cognitive ability adequate to safely complete daily activities?: No Is the patient deaf or have difficulty  hearing?: Yes Does the patient have difficulty seeing, even when wearing glasses/contacts?: Yes Does the patient have difficulty concentrating, remembering, or making decisions?: Yes Patient able to express need for assistance with ADLs?: No Does the patient have difficulty dressing or bathing?: Yes Independently performs ADLs?: No Communication: Independent Dressing (OT): Dependent Is this a change from baseline?:  (unknown) Grooming: Dependent Is this a change from baseline?:  (unknown) Feeding: Needs assistance Is this a change from baseline?:  (unknown) Bathing: Dependent Is this a change from baseline?:  (unknown) Toileting: Dependent Is this a change from baseline?:  (unknown) In/Out Bed: Dependent Is this a change from baseline?:  (unknown) Walks in Home: Dependent Is this a change from baseline?:  (unknown) Does the patient have difficulty walking or climbing stairs?: Yes Weakness of Legs: Both Weakness of Arms/Hands: Both  Permission Sought/Granted Permission sought to share information with : Facility Contact Representative,Family Supports Permission granted to share information with : No  Share Information with NAME: Heidi  Permission granted to share info w AGENCY: [facility]  Permission granted to share info w Relationship: Niece  Permission granted to share info w Contact Information: [phone]  Emotional Assessment   Attitude/Demeanor/Rapport: Unable to Assess Affect (typically observed): Unable to Assess Orientation: : Oriented to Self Alcohol / Substance Use: Not Applicable Psych Involvement: No (comment)  Admission diagnosis:  Parotitis [K11.20] AKI (acute kidney injury) (HCC) [N17.9] Severe sepsis (HCC) [A41.9, R65.20] Altered mental status, unspecified altered mental status type [R41.82] Patient Active Problem List  Diagnosis Date Noted  . Severe sepsis (HCC) 03/02/2020  . Parotitis 03/02/2020  . Chronic renal disease, stage III (HCC) 01/25/2018  .  Weakness 10/03/2017  . Adult failure to thrive 10/03/2017  . Positive D dimer 09/26/2017  . Elevated troponin 09/26/2017  . Personal history of DVT (deep vein thrombosis) 09/26/2017  . COPD with acute exacerbation (HCC) 09/26/2017  . CAD (coronary artery disease) 09/08/2017  . Goals of care, counseling/discussion   . Palliative care by specialist   . Acute kidney injury superimposed on chronic kidney disease (HCC) 09/02/2017  . Hypokalemia 09/02/2017  . Hypomagnesemia 09/02/2017  . Controlled type 2 diabetes mellitus with hyperglycemia (HCC) 09/02/2017  . Urinary incontinence 09/02/2017  . Fall 08/13/2017  . Acute renal failure superimposed on stage 3 chronic kidney disease (HCC) 08/13/2017  . HLD (hyperlipidemia) 08/13/2017  . Essential hypertension 08/13/2017  . GERD (gastroesophageal reflux disease) 08/13/2017  . Orthostatic hypotension 08/13/2017  . Deep vein thrombosis (DVT) of both lower extremities (HCC)   . Hyperglycemia   . History of pulmonary embolism 07/20/2017  . Acute on chronic diastolic heart failure (HCC) 07/20/2017  . Bilateral leg edema 07/20/2017  . Acute exacerbation of chronic obstructive pulmonary disease (COPD) (HCC) 07/19/2017  . 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 Cigarette smoker 09/08/2016  . Morbid obesity due to excess calories (HCC) 09/08/2016  . Pain in joint, ankle and foot 03/31/2012  . Abnormality of gait 03/31/2012  . Rheumatoid arthritis (HCC) 03/31/2012  . COPD GOLD III  02/12/2011  . Sleep apnea 02/12/2011   PCP:  Soundra Pilon, FNP Pharmacy:   [facility] - [place], [place] - [address] [place] [address] Phone: [phone] Fax: [phone]  [id number] - [address] [place] [address] Phone: [phone] Fax: [phone]     Social Determinants of Health (SDOH) Interventions    Readmission Risk Interventions Readmission Risk Prevention  Plan 03/14/2020  Transportation Screening Complete  Medication Review (RN Care Manager) Referral to Pharmacy  PCP or Specialist appointment within 3-5 days of discharge Complete  HRI or Home Care Consult Complete  SW Recovery Care/Counseling Consult Complete  Palliative Care Screening Not Applicable  Skilled Nursing Facility Complete  Some recent data might be hidden

## 2020-03-14 NOTE — Discharge Summary (Signed)
PATIENT DETAILS Name: Chad Duke Age: 75 y.o. Sex: male Date of Birth: 03-Oct-1945 MRN: [id number]. Admitting Physician: Calvert Cantor, MD [id number], Resa Miner, FNP  Admit Date: 03/02/2020 Discharge date: 03/14/2020  Recommendations for Outpatient Follow-up:  1. Follow up with PCP in 1-2 weeks 2. Please obtain CMP/CBC in one week 3. Please ensure follow-up with ENT.   Admitted From:  SNF  Disposition: SNF   Home Health: No  Equipment/Devices: None  Discharge Condition: Stable  CODE STATUS: FULL CODE  Diet recommendation:  Diet Order            Diet - low sodium heart healthy           Diet Carb Modified           DIET DYS 2 Room service appropriate? Yes with Assist; Fluid consistency: Thin  Diet effective now                  Brief Narrative:  75 year old residing at [facility] health care, chronic diastolic CHF, HTN, AAA, PE, rheumatoid arthritis, hypogonadism, depression, COPD on chronic 2 L nasal cannula, OSA, HLD was sent to the hospital for poor oral intake and left-sided facial swelling concerning for parotitis.  Patient was diagnosed of COVID 2 weeks ago at [facility] and was treated at the facility.  Patient was seen by ENT in the ER started on Decadron; warm compresses and Unasyn.   Patient gradually improved-however further hospital course complicated by development of encephalopathy. See below for further details.  Significant studies: 1/22>> chest x-ray: No active disease 1/22>> CT C-spine: No acute traumatic injury to C-spine. 1/22>> CT head: No acute intracranial abnormality. 1/22>> CT maxillofacial: Anterior dislocation of the left mandibular condyle/extensive soft tissue swelling. 1/26>> MRI brain: No acute intracranial abnormality. 1/28>> ammonia: Normal 1/28>> TSH: Normal 1/28>> RPR: Nonreactive 1/29>> EEG: No seizures. 1/31>> vitamin B12: 469  Antibiotics: Unasyn: 1/22>> 1/31 Vancomycin: 1/22 x 1  Microbiology: 1/22>> urine culture:  Negative 1/22>> blood culture: Negative 1/23>> blood culture: Negative  Brief Hospital Course: Severe sepsis secondary to left parotitis: Sepsis physiology has resolved-no significant left facial swelling observed-has completed a course of Unasyn, and Decadron.  Subluxation of left mandibular condyle: No inpatient work-up recommended-outpatient follow-up with ENT  Acute metabolic encephalopathy:  Gradually improved-he is much more awake and alert compared to the past few days-and actually spoke to his niece Heidi over the phone. Neurology evaluation completed-see work-up above. S/p high-dose thiamine x3 days-remains on Seroquel.  Suspect his mentation will continue to gradually improve over the next several weeks.  AKI on CKD stage III: AKI hemodynamically mediated-improved-creatinine close to baseline.  Anemia: Secondary to acute illness-superimposed on anemia due to kidney disease-no overt bleeding-follow.  Thrombocytopenia: Could be due to COVID-19 infection-slowly improving.  COPD with chronic hypoxemic respiratory failure on 2-3 L of oxygen at home: Stable-continue bronchodilators  History of PE: On Eliquis  History of rheumatoid arthritis: On Remicade infusion as outpatient.  DM-2: CBG stable on SSI.  Resume Metformin on discharge  Obesity: Estimated body mass index is 32.58 kg/m as calculated from the following:   Height as of this encounter: 5\' 8"  (1.727 m).   Weight as of this encounter: 97.2 kg.   RN pressure injury documentation: Pressure Injury 03/06/20 Heel Left Deep Tissue Pressure Injury - Purple or maroon localized area of discolored intact skin or blood-filled blister due to damage of underlying soft tissue from pressure and/or shear. (Active)  03/06/20 1932  Location: Heel  Location Orientation:  Left  Staging: Deep Tissue Pressure Injury - Purple or maroon localized area of discolored intact skin or blood-filled blister due to damage of underlying soft  tissue from pressure and/or shear.  Wound Description (Comments):   Present on Admission:      Discharge Diagnoses:  Principal Problem:   Severe sepsis (HCC) Active Problems:   COPD GOLD III    Sleep apnea   Rheumatoid arthritis (HCC)   Morbid obesity due to excess calories (HCC)   History of pulmonary embolism   Acute renal failure superimposed on stage 3 chronic kidney disease (HCC)   Controlled type 2 diabetes mellitus with hyperglycemia (HCC)   Parotitis   Discharge Instructions: Check CBGs before meals and at bedtime      Person Under Monitoring Name: Chad Duke  Location: [address] [place] [address]   Infection Prevention Recommendations for Individuals Confirmed to have, or Being Evaluated for, 2019 Novel Coronavirus (COVID-19) Infection Who Receive Care at Home  Individuals who are confirmed to have, or are being evaluated for, COVID-19 should follow the prevention steps below until a healthcare provider or local or state health department says they can return to normal activities.  Stay home except to get medical care You should restrict activities outside your home, except for getting medical care. Do not go to work, school, or public areas, and do not use public transportation or taxis.  Call ahead before visiting your doctor Before your medical appointment, call the healthcare provider and tell them that you have, or are being evaluated for, COVID-19 infection. This will help the healthcare provider's office take steps to keep other people from getting infected. Ask your healthcare provider to call the local or state health department.  Monitor your symptoms Seek prompt medical attention if your illness is worsening (e.g., difficulty breathing). Before going to your medical appointment, call the healthcare provider and tell them that you have, or are being evaluated for, COVID-19 infection. Ask your healthcare provider to call the local  or state health department.  Wear a facemask You should wear a facemask that covers your nose and mouth when you are in the same room with other people and when you visit a healthcare provider. People who live with or visit you should also wear a facemask while they are in the same room with you.  Separate yourself from other people in your home As much as possible, you should stay in a different room from other people in your home. Also, you should use a separate bathroom, if available.  Avoid sharing household items You should not share dishes, drinking glasses, cups, eating utensils, towels, bedding, or other items with other people in your home. After using these items, you should wash them thoroughly with soap and water.  Cover your coughs and sneezes Cover your mouth and nose with a tissue when you cough or sneeze, or you can cough or sneeze into your sleeve. Throw used tissues in a lined trash can, and immediately wash your hands with soap and water for at least 20 seconds or use an alcohol-based hand rub.  Wash your [employer] your hands often and thoroughly with soap and water for at least 20 seconds. You can use an alcohol-based hand sanitizer if soap and water are not available and if your hands are not visibly dirty. Avoid touching your eyes, nose, and mouth with unwashed hands.   Prevention Steps for Caregivers and Household Members of Individuals Confirmed to have, or Being Evaluated  for, COVID-19 Infection Being Cared for in the Home  If you live with, or provide care at home for, a person confirmed to have, or being evaluated for, COVID-19 infection please follow these guidelines to prevent infection:  Follow healthcare provider's instructions Make sure that you understand and can help the patient follow any healthcare provider instructions for all care.  Provide for the patient's basic needs You should help the patient with basic needs in the home and provide  support for getting groceries, prescriptions, and other personal needs.  Monitor the patient's symptoms If they are getting sicker, call his or her medical provider and tell them that the patient has, or is being evaluated for, COVID-19 infection. This will help the healthcare provider's office take steps to keep other people from getting infected. Ask the healthcare provider to call the local or state health department.  Limit the number of people who have contact with the patient  If possible, have only one caregiver for the patient.  Other household members should stay in another home or place of residence. If this is not possible, they should stay  in another room, or be separated from the patient as much as possible. Use a separate bathroom, if available.  Restrict visitors who do not have an essential need to be in the home.  Keep older adults, very young children, and other sick people away from the patient Keep older adults, very young children, and those who have compromised immune systems or chronic health conditions away from the patient. This includes people with chronic heart, lung, or kidney conditions, diabetes, and cancer.  Ensure good ventilation Make sure that shared spaces in the home have good air flow, such as from an air conditioner or an opened window, weather permitting.  Wash your hands often  Wash your hands often and thoroughly with soap and water for at least 20 seconds. You can use an alcohol based hand sanitizer if soap and water are not available and if your hands are not visibly dirty.  Avoid touching your eyes, nose, and mouth with unwashed hands.  Use disposable paper towels to dry your hands. If not available, use dedicated cloth towels and replace them when they become wet.  Wear a facemask and gloves  Wear a disposable facemask at all times in the room and gloves when you touch or have contact with the patient's blood, body fluids, and/or  secretions or excretions, such as sweat, saliva, sputum, nasal mucus, vomit, urine, or feces.  Ensure the mask fits over your nose and mouth tightly, and do not touch it during use.  Throw out disposable facemasks and gloves after using them. Do not reuse.  Wash your hands immediately after removing your facemask and gloves.  If your personal clothing becomes contaminated, carefully remove clothing and launder. Wash your hands after handling contaminated clothing.  Place all used disposable facemasks, gloves, and other waste in a lined container before disposing them with other household waste.  Remove gloves and wash your hands immediately after handling these items.  Do not share dishes, glasses, or other household items with the patient  Avoid sharing household items. You should not share dishes, drinking glasses, cups, eating utensils, towels, bedding, or other items with a patient who is confirmed to have, or being evaluated for, COVID-19 infection.  After the person uses these items, you should wash them thoroughly with soap and water.  Wash laundry thoroughly  Immediately remove and wash clothes or bedding that have  blood, body fluids, and/or secretions or excretions, such as sweat, saliva, sputum, nasal mucus, vomit, urine, or feces, on them.  Wear gloves when handling laundry from the patient.  Read and follow directions on labels of laundry or clothing items and detergent. In general, wash and dry with the warmest temperatures recommended on the label.  Clean all areas the individual has used often  Clean all touchable surfaces, such as counters, tabletops, doorknobs, bathroom fixtures, toilets, phones, keyboards, tablets, and bedside tables, every day. Also, clean any surfaces that may have blood, body fluids, and/or secretions or excretions on them.  Wear gloves when cleaning surfaces the patient has come in contact with.  Use a diluted bleach solution (e.g., dilute bleach  with 1 part bleach and 10 parts water) or a household disinfectant with a label that says EPA-registered for coronaviruses. To make a bleach solution at home, add 1 tablespoon of bleach to 1 quart (4 cups) of water. For a larger supply, add  cup of bleach to 1 gallon (16 cups) of water.  Read labels of cleaning products and follow recommendations provided on product labels. Labels contain instructions for safe and effective use of the cleaning product including precautions you should take when applying the product, such as wearing gloves or eye protection and making sure you have good ventilation during use of the product.  Remove gloves and wash hands immediately after cleaning.  Monitor yourself for signs and symptoms of illness Caregivers and household members are considered close contacts, should monitor their health, and will be asked to limit movement outside of the home to the extent possible. Follow the monitoring steps for close contacts listed on the symptom monitoring form.   ? If you have additional questions, contact your local health department or call the epidemiologist on call at [phone] (available 24/7). ? This guidance is subject to change. For the most up-to-date guidance from CDC, please refer to their website: [employer]Met[employer].hu    Activity:  As tolerated with Full fall precautions use walker/cane & assistance as needed  Discharge Instructions    Call MD for:  extreme fatigue   Complete by: As directed    Diet - low sodium heart healthy   Complete by: As directed    Diet Carb Modified   Complete by: As directed    Discharge wound care:   Complete by: As directed    1.)Wound care  Daily      Comments: Clean right shin wound with soap and water, dry and place a piece of Xeroform gauze [place] # 294) over the wound, cover with 4x4 then wrap with Kerlix. Change daily.    2.)Wound care  Every shift       Comments: Continue foam dressing to left heel. Pull down each shift to assess. Change every 3 days. Place both heels in Prevalon boots.   Increase activity slowly   Complete by: As directed      Allergies as of 03/14/2020      Reactions   Varenicline Other (See Comments)   Suicidal thoughts Bad dreams Other reaction(s): Other (See Comments) Suicidal thoughts   Gabapentin Other (See Comments)   dizziness      Medication List    STOP taking these medications   amoxicillin-clavulanate 875-125 MG tablet Commonly known as: AUGMENTIN   dexamethasone 6 MG tablet Commonly known as: DECADRON   furosemide 40 MG tablet Commonly known as: LASIX   potassium chloride SA 20 MEQ tablet Commonly known as: KLOR-CON  Remdesivir 100 MG/20ML Soln   Zinc 220 (50 Zn) MG Caps     TAKE these medications   albuterol 108 (90 Base) MCG/ACT inhaler Commonly known as: VENTOLIN HFA Inhale 2 puffs into the lungs every 4 (four) hours as needed for wheezing or shortness of breath.   allopurinol 100 MG tablet Commonly known as: ZYLOPRIM Take 100 mg by mouth daily.   apixaban 5 MG Tabs tablet Commonly known as: Eliquis Take 1 tablet (5 mg total) by mouth 2 (two) times daily.   atorvastatin 40 MG tablet Commonly known as: LIPITOR Take 1 tablet (40 mg total) by mouth daily. What changed: when to take this   budesonide 0.25 MG/2ML nebulizer solution Commonly known as: PULMICORT Take 0.25 mg by nebulization 2 (two) times daily.   buPROPion 150 MG 12 hr tablet Commonly known as: ZYBAN Take 1 tablet (150 mg total) by mouth 2 (two) times daily.   CALCIUM-MAGNESIUM-VITAMIN D PO Take 1 tablet by mouth daily.   folic acid 1 MG tablet Commonly known as: FOLVITE Take 2 tablets (2 mg total) by mouth daily.   Glucerna Liqd Take 237 mLs by mouth 2 (two) times daily between meals.   insulin aspart 100 UNIT/ML injection Commonly known as: novoLOG 0-15 Units, Subcutaneous, 3 times daily with  meals CBG < 70: Implement Hypoglycemia measures CBG 70 - 120: 0 units CBG 121 - 150: 2 units CBG 151 - 200: 3 units CBG 201 - 250: 5 units CBG 251 - 300: 8 units CBG 301 - 350: 11 units CBG 351 - 400: 15 units CBG > 400: call MD   ipratropium-albuterol 0.5-2.5 (3) MG/3ML Soln Commonly known as: DUONEB Take 3 mLs by nebulization 3 (three) times daily. What changed: Another medication with the same name was removed. Continue taking this medication, and follow the directions you see here.   Magnesium 400 MG Caps Take 400 mg by mouth at bedtime.   metFORMIN 500 MG tablet Commonly known as: GLUCOPHAGE Take 500 mg by mouth 2 (two) times daily.   pantoprazole 40 MG tablet Commonly known as: PROTONIX Take 40 mg by mouth daily.   QUEtiapine 25 MG tablet Commonly known as: SEROQUEL Take 1 tablet (25 mg total) by mouth at bedtime.   Symbicort 160-4.5 MCG/ACT inhaler Generic drug: budesonide-formoterol Inhale 2 puffs into the lungs in the morning and at bedtime.   Vitamin D-3 125 MCG (5000 UT) Tabs Take 5,000 Units by mouth daily.            Discharge Care Instructions  (From admission, onward)         Start     Ordered   03/14/20 0000  Discharge wound care:       Comments: 1.)Wound care  Daily      Comments: Clean right shin wound with soap and water, dry and place a piece of Xeroform gauze [place] # 294) over the wound, cover with 4x4 then wrap with Kerlix. Change daily.    2.)Wound care  Every shift      Comments: Continue foam dressing to left heel. Pull down each shift to assess. Change every 3 days. Place both heels in Prevalon boots.   03/14/20 1058          Follow-up Information    Soundra Pilon, FNP. Schedule an appointment as soon as possible for a visit in 1 week(s).   Specialty: Family Medicine Contact information: [address] [place] [address] [phone]  Lewayne Bunting, MD Follow up in 4 week(s).   Specialty:  Cardiology Contact information: [address] [place] [address] [phone]              Allergies  Allergen Reactions  . Varenicline Other (See Comments)    Suicidal thoughts Bad dreams Other reaction(s): Other (See Comments) Suicidal thoughts  . Gabapentin Other (See Comments)    dizziness    Other Procedures/Studies: CT Head Wo Contrast  Result Date: 03/02/2020 CLINICAL DATA:  "Head trauma, minor". EXAM: CT HEAD WITHOUT CONTRAST TECHNIQUE: Contiguous axial images were obtained from the base of the skull through the vertex without intravenous contrast. COMPARISON:  02/20/2020. FINDINGS: Brain: Advanced cerebral and cerebellar atrophy. Moderate low density in the periventricular white matter likely related to small vessel disease. No mass lesion, hemorrhage, hydrocephalus, acute infarct, intra-axial, or extra-axial fluid collection. Vascular: Intracranial atherosclerosis. Skull: No scalp soft tissue swelling.  No skull fracture. Sinuses/Orbits: Evaluated on dedicated face CT, dictated separately. Other: None. IMPRESSION: 1. No acute intracranial abnormality. 2. Cerebral atrophy and small vessel ischemic change. Electronically Signed   By: Jeronimo Greaves M.D.   On: 03/02/2020 14:53   CT Head Wo Contrast  Result Date: 02/20/2020 CLINICAL DATA:  Fall in lobby Head trauma, minor (Age >= 65y) second fall, on Eliquis EXAM: CT HEAD WITHOUT CONTRAST TECHNIQUE: Contiguous axial images were obtained from the base of the skull through the vertex without intravenous contrast. COMPARISON:  None. FINDINGS: Brain: No acute intracranial hemorrhage. No focal mass lesion. No CT evidence of acute infarction. No midline shift or mass effect. No hydrocephalus. Basilar cisterns are patent. There are periventricular and subcortical white matter hypodensities. Generalized cortical atrophy. Vascular: No hyperdense vessel or unexpected calcification. Skull: Normal. Negative for fracture or focal  lesion. Sinuses/Orbits: Paranasal sinuses and mastoid air cells are clear. Orbits are clear. Other: None. IMPRESSION: 1. No intracranial trauma. 2. Atrophy and white matter microvascular disease. 3. No acute findings. Electronically Signed   By: Genevive Bi M.D.   On: 02/20/2020 07:39   CT Head Wo Contrast  Result Date: 02/19/2020 CLINICAL DATA:  74 year old male with head trauma. EXAM: CT HEAD WITHOUT CONTRAST TECHNIQUE: Contiguous axial images were obtained from the base of the skull through the vertex without intravenous contrast. COMPARISON:  Head CT dated 01/21/2020. FINDINGS: Brain: Mild age-related atrophy and chronic microvascular ischemic changes. There is no acute intracranial hemorrhage. No mass effect or midline shift. No extra-axial fluid collection. Vascular: No hyperdense vessel or unexpected calcification. Skull: Normal. Negative for fracture or focal lesion. Sinuses/Orbits: No acute finding. Other: None IMPRESSION: 1. No acute intracranial pathology. 2. Mild age-related atrophy and chronic microvascular ischemic changes. Electronically Signed   By: Elgie Collard M.D.   On: 02/19/2020 21:13   CT Cervical Spine Wo Contrast  Result Date: 03/02/2020 CLINICAL DATA:  75 year old male with history of neck trauma from a fall on 02/28/2020. On Eliquis. Swelling in the left side of face. EXAM: CT MAXILLOFACIAL WITHOUT CONTRAST CT CERVICAL SPINE WITHOUT CONTRAST TECHNIQUE: Multidetector CT imaging of the maxillofacial structures was performed. Multiplanar CT image reconstructions were also generated. A small metallic BB was placed on the right temple in order to reliably differentiate right from left. Multidetector CT imaging of the cervical spine was performed without intravenous contrast. Multiplanar CT image reconstructions were also generated. COMPARISON:  Head CT 03/02/2020. FINDINGS: CT MAXILLOFACIAL FINDINGS Osseous: No acute displaced fracture. Left mandibular condyle is anteriorly  dislocated (sagittal image 76 of series  11). No destructive process. Orbits: Negative. No traumatic or inflammatory finding. Sinuses: Clear. Soft tissues: A extensive soft tissue swelling in the subcutaneous fat of the left side of the face lateral to the left mandible. Limited intracranial: See separate report for recently obtained head CT dated 03/02/2020 for full description of intracranial findings. CT CERVICAL FINDINGS Alignment: Normal. Skull base and vertebrae: No acute fracture. No primary bone lesion or focal pathologic process. Soft tissues and spinal canal: No prevertebral fluid or swelling. No visible canal hematoma. Disc levels: Mild multilevel degenerative disc disease, most apparent at C5-C6. Mild multilevel facet arthropathy. Upper chest: Unremarkable. Other: None. IMPRESSION: 1. Anterior dislocation of the left mandibular condyle from the temporomandibular joint with extensive overlying soft tissue swelling. 2. No evidence of significant acute traumatic injury to the cervical spine. Electronically Signed   By: Trudie Reed M.D.   On: 03/02/2020 15:14   MR BRAIN WO CONTRAST  Result Date: 03/06/2020 CLINICAL DATA:  Delirium. Currently being treated for acute left parotitis with sepsis. EXAM: MRI HEAD WITHOUT CONTRAST TECHNIQUE: Multiplanar, multiecho pulse sequences of the brain and surrounding structures were obtained without intravenous contrast. COMPARISON:  Head and maxillofacial CTs 03/02/2020. Head MRI 01/19/2020. FINDINGS: Brain: There is no evidence of an acute infarct, intracranial hemorrhage, mass, midline shift, or extra-axial fluid collection. T2 hyperintensities in the cerebral white matter and pons are similar to the prior MRI and are nonspecific but compatible with moderate chronic small vessel ischemic disease. There is moderate cerebral atrophy. Vascular: Major intracranial vascular flow voids are preserved. Skull and upper cervical spine: Unremarkable bone marrow signal.  Sinuses/Orbits: Unremarkable orbits. Paranasal sinuses and mastoid air cells are clear. Other: Left parotid swelling consistent related to known parotitis. IMPRESSION: 1. No acute intracranial abnormality. 2. Moderate chronic small vessel ischemic disease and cerebral atrophy. 3. Acute left parotitis. Electronically Signed   By: Sebastian Ache M.D.   On: 03/06/2020 17:20   DG Chest Port 1 View  Result Date: 03/02/2020 CLINICAL DATA:  Dyspnea EXAM: PORTABLE CHEST 1 VIEW COMPARISON:  January 21, 2020 FINDINGS: The heart size and mediastinal contours are stable. The aorta is tortuous. Both lungs are clear. Chronic deformity of several lower right ribs are noted. IMPRESSION: No active disease. Electronically Signed   By: Sherian Rein M.D.   On: 03/02/2020 12:28   EEG adult  Result Date: 03/09/2020 Charlsie Quest, MD     03/09/2020  9:04 AM Patient Name: Tylon Kemmerling MRN: [id number] Epilepsy Attending: Charlsie Quest Referring Physician/Provider: Dr Huey Bienenstock Date: 03/08/2020 Duration: 22.24 mins Patient history: 75yo M with ams, EEG to evaluate for seizure Level of alertness: Awake AEDs during EEG study: None Technical aspects: This EEG study was done with scalp electrodes positioned according to the 10-20 International system of electrode placement. Electrical activity was acquired at a sampling rate of  and reviewed with a high frequency filter of  and a low frequency filter of . EEG data were recorded continuously and digitally stored. Description: No posterior dominant rhythm was seen. EEG showed continuous generalized 5 to 6 Hz theta as well as intermittent 2-3hz  delta slowing.  Hyperventilation and photic stimulation were not performed.   ABNORMALITY -Continuous slow, generalized IMPRESSION: This study is suggestive of mild to moderate diffuse encephalopathy, nonspecific etiology. No seizures or epileptiform discharges were seen throughout the recording. Priyanka Annabelle Harman   CT  MAXILLOFACIAL WO CONTRAST  Result Date: 03/02/2020 CLINICAL DATA:  75 year old male with history of neck trauma from a  fall on 02/28/2020. On Eliquis. Swelling in the left side of face. EXAM: CT MAXILLOFACIAL WITHOUT CONTRAST CT CERVICAL SPINE WITHOUT CONTRAST TECHNIQUE: Multidetector CT imaging of the maxillofacial structures was performed. Multiplanar CT image reconstructions were also generated. A small metallic BB was placed on the right temple in order to reliably differentiate right from left. Multidetector CT imaging of the cervical spine was performed without intravenous contrast. Multiplanar CT image reconstructions were also generated. COMPARISON:  Head CT 03/02/2020. FINDINGS: CT MAXILLOFACIAL FINDINGS Osseous: No acute displaced fracture. Left mandibular condyle is anteriorly dislocated (sagittal image 76 of series 11). No destructive process. Orbits: Negative. No traumatic or inflammatory finding. Sinuses: Clear. Soft tissues: A extensive soft tissue swelling in the subcutaneous fat of the left side of the face lateral to the left mandible. Limited intracranial: See separate report for recently obtained head CT dated 03/02/2020 for full description of intracranial findings. CT CERVICAL FINDINGS Alignment: Normal. Skull base and vertebrae: No acute fracture. No primary bone lesion or focal pathologic process. Soft tissues and spinal canal: No prevertebral fluid or swelling. No visible canal hematoma. Disc levels: Mild multilevel degenerative disc disease, most apparent at C5-C6. Mild multilevel facet arthropathy. Upper chest: Unremarkable. Other: None. IMPRESSION: 1. Anterior dislocation of the left mandibular condyle from the temporomandibular joint with extensive overlying soft tissue swelling. 2. No evidence of significant acute traumatic injury to the cervical spine. Electronically Signed   By: Trudie Reed M.D.   On: 03/02/2020 15:14     TODAY-DAY OF DISCHARGE:  Subjective:   Melton Walls today has no headache,no chest abdominal pain,no new weakness tingling or numbness, feels much better wants to go home today.   Objective:   Blood pressure 106/83, pulse 100, temperature 99 F (37.2 C), temperature source Axillary, resp. rate 20, height 5\' 8"  (1.727 m), weight 97.2 kg, SpO2 90 %.  Intake/Output Summary (Last 24 hours) at 03/14/2020 1101 Last data filed at 03/14/2020 0815 Gross per 24 hour  Intake 225 ml  Output 300 ml  Net -75 ml   Filed Weights   03/02/20 1146 03/04/20 0318  Weight: 99.8 kg 97.2 kg    Exam: Awake Alert, Oriented *3, No new F.N deficits, Normal affect [place].AT,PERRAL Supple Neck,No JVD, No cervical lymphadenopathy appriciated.  Symmetrical Chest wall movement, Good air movement bilaterally, CTAB RRR,No Gallops,Rubs or new Murmurs, No Parasternal Heave +ve B.Sounds, Abd Soft, Non tender, No organomegaly appriciated, No rebound -guarding or rigidity. No Cyanosis, Clubbing or edema, No new Rash or bruise   PERTINENT RADIOLOGIC STUDIES: CT Head Wo Contrast  Result Date: 03/02/2020 CLINICAL DATA:  "Head trauma, minor". EXAM: CT HEAD WITHOUT CONTRAST TECHNIQUE: Contiguous axial images were obtained from the base of the skull through the vertex without intravenous contrast. COMPARISON:  02/20/2020. FINDINGS: Brain: Advanced cerebral and cerebellar atrophy. Moderate low density in the periventricular white matter likely related to small vessel disease. No mass lesion, hemorrhage, hydrocephalus, acute infarct, intra-axial, or extra-axial fluid collection. Vascular: Intracranial atherosclerosis. Skull: No scalp soft tissue swelling.  No skull fracture. Sinuses/Orbits: Evaluated on dedicated face CT, dictated separately. Other: None. IMPRESSION: 1. No acute intracranial abnormality. 2. Cerebral atrophy and small vessel ischemic change. Electronically Signed   By: Jeronimo Greaves M.D.   On: 03/02/2020 14:53   CT Head Wo Contrast  Result Date: 02/20/2020 CLINICAL  DATA:  Fall in lobby Head trauma, minor (Age >= 65y) second fall, on Eliquis EXAM: CT HEAD WITHOUT CONTRAST TECHNIQUE: Contiguous axial images were obtained from the  base of the skull through the vertex without intravenous contrast. COMPARISON:  None. FINDINGS: Brain: No acute intracranial hemorrhage. No focal mass lesion. No CT evidence of acute infarction. No midline shift or mass effect. No hydrocephalus. Basilar cisterns are patent. There are periventricular and subcortical white matter hypodensities. Generalized cortical atrophy. Vascular: No hyperdense vessel or unexpected calcification. Skull: Normal. Negative for fracture or focal lesion. Sinuses/Orbits: Paranasal sinuses and mastoid air cells are clear. Orbits are clear. Other: None. IMPRESSION: 1. No intracranial trauma. 2. Atrophy and white matter microvascular disease. 3. No acute findings. Electronically Signed   By: Genevive Bi M.D.   On: 02/20/2020 07:39   CT Head Wo Contrast  Result Date: 02/19/2020 CLINICAL DATA:  74 year old male with head trauma. EXAM: CT HEAD WITHOUT CONTRAST TECHNIQUE: Contiguous axial images were obtained from the base of the skull through the vertex without intravenous contrast. COMPARISON:  Head CT dated 01/21/2020. FINDINGS: Brain: Mild age-related atrophy and chronic microvascular ischemic changes. There is no acute intracranial hemorrhage. No mass effect or midline shift. No extra-axial fluid collection. Vascular: No hyperdense vessel or unexpected calcification. Skull: Normal. Negative for fracture or focal lesion. Sinuses/Orbits: No acute finding. Other: None IMPRESSION: 1. No acute intracranial pathology. 2. Mild age-related atrophy and chronic microvascular ischemic changes. Electronically Signed   By: Elgie Collard M.D.   On: 02/19/2020 21:13   CT Cervical Spine Wo Contrast  Result Date: 03/02/2020 CLINICAL DATA:  75 year old male with history of neck trauma from a fall on 02/28/2020. On Eliquis.  Swelling in the left side of face. EXAM: CT MAXILLOFACIAL WITHOUT CONTRAST CT CERVICAL SPINE WITHOUT CONTRAST TECHNIQUE: Multidetector CT imaging of the maxillofacial structures was performed. Multiplanar CT image reconstructions were also generated. A small metallic BB was placed on the right temple in order to reliably differentiate right from left. Multidetector CT imaging of the cervical spine was performed without intravenous contrast. Multiplanar CT image reconstructions were also generated. COMPARISON:  Head CT 03/02/2020. FINDINGS: CT MAXILLOFACIAL FINDINGS Osseous: No acute displaced fracture. Left mandibular condyle is anteriorly dislocated (sagittal image 76 of series 11). No destructive process. Orbits: Negative. No traumatic or inflammatory finding. Sinuses: Clear. Soft tissues: A extensive soft tissue swelling in the subcutaneous fat of the left side of the face lateral to the left mandible. Limited intracranial: See separate report for recently obtained head CT dated 03/02/2020 for full description of intracranial findings. CT CERVICAL FINDINGS Alignment: Normal. Skull base and vertebrae: No acute fracture. No primary bone lesion or focal pathologic process. Soft tissues and spinal canal: No prevertebral fluid or swelling. No visible canal hematoma. Disc levels: Mild multilevel degenerative disc disease, most apparent at C5-C6. Mild multilevel facet arthropathy. Upper chest: Unremarkable. Other: None. IMPRESSION: 1. Anterior dislocation of the left mandibular condyle from the temporomandibular joint with extensive overlying soft tissue swelling. 2. No evidence of significant acute traumatic injury to the cervical spine. Electronically Signed   By: Trudie Reed M.D.   On: 03/02/2020 15:14   MR BRAIN WO CONTRAST  Result Date: 03/06/2020 CLINICAL DATA:  Delirium. Currently being treated for acute left parotitis with sepsis. EXAM: MRI HEAD WITHOUT CONTRAST TECHNIQUE: Multiplanar, multiecho pulse  sequences of the brain and surrounding structures were obtained without intravenous contrast. COMPARISON:  Head and maxillofacial CTs 03/02/2020. Head MRI 01/19/2020. FINDINGS: Brain: There is no evidence of an acute infarct, intracranial hemorrhage, mass, midline shift, or extra-axial fluid collection. T2 hyperintensities in the cerebral white matter and pons are similar to the prior  MRI and are nonspecific but compatible with moderate chronic small vessel ischemic disease. There is moderate cerebral atrophy. Vascular: Major intracranial vascular flow voids are preserved. Skull and upper cervical spine: Unremarkable bone marrow signal. Sinuses/Orbits: Unremarkable orbits. Paranasal sinuses and mastoid air cells are clear. Other: Left parotid swelling consistent related to known parotitis. IMPRESSION: 1. No acute intracranial abnormality. 2. Moderate chronic small vessel ischemic disease and cerebral atrophy. 3. Acute left parotitis. Electronically Signed   By: Sebastian Ache M.D.   On: 03/06/2020 17:20   DG Chest Port 1 View  Result Date: 03/02/2020 CLINICAL DATA:  Dyspnea EXAM: PORTABLE CHEST 1 VIEW COMPARISON:  January 21, 2020 FINDINGS: The heart size and mediastinal contours are stable. The aorta is tortuous. Both lungs are clear. Chronic deformity of several lower right ribs are noted. IMPRESSION: No active disease. Electronically Signed   By: Sherian Rein M.D.   On: 03/02/2020 12:28   EEG adult  Result Date: 03/09/2020 Charlsie Quest, MD     03/09/2020  9:04 AM Patient Name: Hiren Peplinski MRN: [id number] Epilepsy Attending: Charlsie Quest Referring Physician/Provider: Dr Huey Bienenstock Date: 03/08/2020 Duration: 22.24 mins Patient history: 75yo M with ams, EEG to evaluate for seizure Level of alertness: Awake AEDs during EEG study: None Technical aspects: This EEG study was done with scalp electrodes positioned according to the 10-20 International system of electrode placement. Electrical  activity was acquired at a sampling rate of 500Hz  and reviewed with a high frequency filter of 70Hz  and a low frequency filter of 1Hz . EEG data were recorded continuously and digitally stored. Description: No posterior dominant rhythm was seen. EEG showed continuous generalized 5 to 6 Hz theta as well as intermittent 2-3hz  delta slowing.  Hyperventilation and photic stimulation were not performed.   ABNORMALITY -Continuous slow, generalized IMPRESSION: This study is suggestive of mild to moderate diffuse encephalopathy, nonspecific etiology. No seizures or epileptiform discharges were seen throughout the recording. Priyanka Annabelle Harman   CT MAXILLOFACIAL WO CONTRAST  Result Date: 03/02/2020 CLINICAL DATA:  75 year old male with history of neck trauma from a fall on 02/28/2020. On Eliquis. Swelling in the left side of face. EXAM: CT MAXILLOFACIAL WITHOUT CONTRAST CT CERVICAL SPINE WITHOUT CONTRAST TECHNIQUE: Multidetector CT imaging of the maxillofacial structures was performed. Multiplanar CT image reconstructions were also generated. A small metallic BB was placed on the right temple in order to reliably differentiate right from left. Multidetector CT imaging of the cervical spine was performed without intravenous contrast. Multiplanar CT image reconstructions were also generated. COMPARISON:  Head CT 03/02/2020. FINDINGS: CT MAXILLOFACIAL FINDINGS Osseous: No acute displaced fracture. Left mandibular condyle is anteriorly dislocated (sagittal image 76 of series 11). No destructive process. Orbits: Negative. No traumatic or inflammatory finding. Sinuses: Clear. Soft tissues: A extensive soft tissue swelling in the subcutaneous fat of the left side of the face lateral to the left mandible. Limited intracranial: See separate report for recently obtained head CT dated 03/02/2020 for full description of intracranial findings. CT CERVICAL FINDINGS Alignment: Normal. Skull base and vertebrae: No acute fracture. No  primary bone lesion or focal pathologic process. Soft tissues and spinal canal: No prevertebral fluid or swelling. No visible canal hematoma. Disc levels: Mild multilevel degenerative disc disease, most apparent at C5-C6. Mild multilevel facet arthropathy. Upper chest: Unremarkable. Other: None. IMPRESSION: 1. Anterior dislocation of the left mandibular condyle from the temporomandibular joint with extensive overlying soft tissue swelling. 2. No evidence of significant acute traumatic injury to the cervical spine.  Electronically Signed   By: Trudie Reed M.D.   On: 03/02/2020 15:14     PERTINENT LAB RESULTS: CBC: Recent Labs    03/12/20 0141 03/14/20 0159  WBC 7.2 7.1  HGB 8.2* 8.1*  HCT 24.4* 25.3*  PLT 133* 154   CMET CMP     Component Value Date/Time   NA 137 03/14/2020 0159   NA 140 04/12/2018 1439   K 3.9 03/14/2020 0159   CL 106 03/14/2020 0159   CO2 24 03/14/2020 0159   GLUCOSE 106 (H) 03/14/2020 0159   BUN 15 03/14/2020 0159   BUN 23 04/12/2018 1439   CREATININE 1.37 (H) 03/14/2020 0159   CALCIUM 8.5 (L) 03/14/2020 0159   PROT 5.1 (L) 03/14/2020 0159   ALBUMIN 1.6 (L) 03/14/2020 0159   AST 29 03/14/2020 0159   ALT 36 03/14/2020 0159   ALKPHOS 92 03/14/2020 0159   BILITOT 0.8 03/14/2020 0159   GFRNONAA 54 (L) 03/14/2020 0159   GFRAA 54 (L) 11/02/2018 0937    GFR Estimated Creatinine Clearance: 52.7 mL/min (A) (by C-G formula based on SCr of 1.37 mg/dL (H)). No results for input(s): LIPASE, AMYLASE in the last 72 hours. No results for input(s): CKTOTAL, CKMB, CKMBINDEX, TROPONINI in the last 72 hours. Invalid input(s): POCBNP No results for input(s): DDIMER in the last 72 hours. No results for input(s): HGBA1C in the last 72 hours. No results for input(s): CHOL, HDL, LDLCALC, TRIG, CHOLHDL, LDLDIRECT in the last 72 hours. No results for input(s): TSH, T4TOTAL, T3FREE, THYROIDAB in the last 72 hours.  Invalid input(s): FREET3 No results for input(s):  VITAMINB12, FOLATE, FERRITIN, TIBC, IRON, RETICCTPCT in the last 72 hours. Coags: No results for input(s): INR in the last 72 hours.  Invalid input(s): PT Microbiology: Recent Results (from the past 240 hour(s))  MRSA PCR Screening     Status: Abnormal   Collection Time: 03/08/20  8:47 AM  Result Value Ref Range Status   MRSA by PCR POSITIVE (A) NEGATIVE Final    Comment:        The GeneXpert MRSA Assay (FDA approved for NASAL specimens only), is one component of a comprehensive MRSA colonization surveillance program. It is not intended to diagnose MRSA infection nor to guide or monitor treatment for MRSA infections. RESULT CALLED TO, READ BACK BY AND VERIFIED WITH: Timoteo Expose RN 12:40 03/08/20 ([email or web address]) Performed at [facility], [address], [place], [place] [address]     FURTHER DISCHARGE INSTRUCTIONS:  Get Medicines reviewed and adjusted: Please take all your medications with you for your next visit with your Primary MD  Laboratory/radiological data: Please request your Primary MD to go over all hospital tests and procedure/radiological results at the follow up, please ask your Primary MD to get all Hospital records sent to his/her office.  In some cases, they will be blood work, cultures and biopsy results pending at the time of your discharge. Please request that your primary care M.D. goes through all the records of your hospital data and follows up on these results.  Also Note the following: If you experience worsening of your admission symptoms, develop shortness of breath, life threatening emergency, suicidal or homicidal thoughts you must seek medical attention immediately by calling 911 or calling your MD immediately  if symptoms less severe.  You must read complete instructions/literature along with all the possible adverse reactions/side effects for all the Medicines you take and that have been prescribed to you. Take any new Medicines after  you have  completely understood and accpet all the possible adverse reactions/side effects.   Do not drive when taking Pain medications or sleeping medications (Benzodaizepines)  Do not take more than prescribed Pain, Sleep and Anxiety Medications. It is not advisable to combine anxiety,sleep and pain medications without talking with your primary care practitioner  Special Instructions: If you have smoked or chewed Tobacco  in the last 2 yrs please stop smoking, stop any regular Alcohol  and or any Recreational drug use.  Wear Seat belts while driving.  Please note: You were cared for by a hospitalist during your hospital stay. Once you are discharged, your primary care physician will handle any further medical issues. Please note that NO REFILLS for any discharge medications will be authorized once you are discharged, as it is imperative that you return to your primary care physician (or establish a relationship with a primary care physician if you do not have one) for your post hospital discharge needs so that they can reassess your need for medications and monitor your lab values.  Total Time spent coordinating discharge including counseling, education and face to face time equals 35 minutes.  Signed: Pepper Wyndham 03/14/2020 11:01 AM

## 2020-03-14 NOTE — TOC Transition Note (Addendum)
Transition of Care [facility]) - CM/SW Discharge Note   Patient Details  Name: Chad Duke MRN: [id number] Date of Birth: 04/16/1945  [facility] CM/SW Contact:  Mearl Latin, LCSW Phone Number: 03/14/2020, 11:34 AM   Clinical Narrative:    Patient will DC to: [facility] Anticipated DC date: 03/14/20 Family notified: Niece, Heidi Transport by: PTAR 5:30pm   Per MD patient ready for DC to [facility]. RN to call report prior to discharge [phone]). RN, patient, patient's family, and facility notified of DC. Discharge Summary and FL2 sent to facility. DC packet on chart. Ambulance transport requested for patient.   CSW will sign off for now as social work intervention is no longer needed. Please consult [place] again if new needs arise.      Final next level of care: Skilled Nursing Facility Barriers to Discharge: No Barriers Identified   Patient Goals and CMS Choice Patient states their goals for this hospitalization and ongoing recovery are:: Return to snf CMS Medicare.gov Compare Post Acute Care list provided to:: Patient Represenative (must comment) Choice offered to / list presented to : [facility] POA / Guardian (Niece)  Discharge Placement   Existing PASRR number confirmed : 03/14/20          Patient chooses bed at: [facility] Patient to be transferred to facility by: PTAR Name of family member notified: Niece, Heidi Patient and family notified of of transfer: 03/14/20  Discharge Plan and Services In-house Referral: Clinical Social Work   Post Acute Care Choice: Skilled Nursing Facility                               Social Determinants of Health (SDOH) Interventions     Readmission Risk Interventions Readmission Risk Prevention Plan 03/14/2020  Transportation Screening Complete  Medication Review Oceanographer) Referral to Pharmacy  PCP or Specialist appointment within 3-5 days of discharge Complete  HRI or Home Care Consult Complete   SW Recovery Care/Counseling Consult Complete  Palliative Care Screening Not Applicable  Skilled Nursing Facility Complete  Some recent data might be hidden

## 2020-03-14 NOTE — Progress Notes (Addendum)
Floor coverage overnight event  Patient with history of COPD with chronic hypoxemic respiratory failure on 2 to 3 L home oxygen.  Notified by the RN that patient became tachycardic with heart rate in the 110s and tachypneic with respiratory rate 30-40.  SPO2 93% on room air.  Temperature 99.1 F.  Blood pressure 122/79.  He was given albuterol inhaler treatment, Combivent, and Dulera.  His work of breathing improved.  Patient seen and examined at bedside.  He has no complaints.  Denies chest pain or shortness of breath.  Tachycardic with heart rate in the 110s.  Respiratory rate currently in the low to mid 20s.  Satting 97% and above on 2 L supplemental oxygen.  No wheezing appreciated upon auscultation of the lungs.  No lower extremity edema.  -EKG showing sinus rhythm, LAFB, RBBB.  No acute changes. -Stat troponin ordered and currently pending -Chest x-ray showing right greater than left bibasilar opacities, likely atelectasis or early consolidation.  Per review of daytime note, patient was diagnosed with Covid 2 weeks ago at [facility] and treated at the facility.  Will start antibiotics for coverage of possible bacterial pneumonia, check procalcitonin level. -Already on chronic anticoagulation with Eliquis due to history of prior PE -Continuous pulse ox, supplemental oxygen as needed -Continue COPD meds -It seems discharge orders were placed previously, canceled for now as patient is not stable for discharge at this time.  Addendum: ACS less likely as patient is not endorsing chest pain.  High-sensitivity troponin mildly elevated but stable (25 > 23).

## 2020-03-14 NOTE — Care Management Important Message (Signed)
Important Message  Patient Details  Name: Chad Duke MRN: [id number] Date of Birth: Jan 01, 1946   Medicare Important Message Given:  Yes - Important Message mailed due to current National Emergency   Verbal consent obtained due to current National Emergency  Relationship to patient: Self Contact Name: Koltin Wehmeyer Call Date: 03/14/20  Time: 1416 Phone: [phone] Outcome: No Answer/Busy Important Message mailed to: Patient address on file    Orson Aloe 03/14/2020, 2:17 PM

## 2020-03-15 DIAGNOSIS — K1120 Sialoadenitis, unspecified: Secondary | ICD-10-CM | POA: Diagnosis not present

## 2020-03-15 DIAGNOSIS — E1165 Type 2 diabetes mellitus with hyperglycemia: Secondary | ICD-10-CM | POA: Diagnosis not present

## 2020-03-15 DIAGNOSIS — N179 Acute kidney failure, unspecified: Secondary | ICD-10-CM | POA: Diagnosis not present

## 2020-03-15 DIAGNOSIS — A419 Sepsis, unspecified organism: Secondary | ICD-10-CM | POA: Diagnosis not present

## 2020-03-15 LAB — GLUCOSE, CAPILLARY
Glucose-Capillary: 84 mg/dL (ref 70–99)
Glucose-Capillary: 87 mg/dL (ref 70–99)

## 2020-03-15 LAB — TROPONIN I (HIGH SENSITIVITY): Troponin I (High Sensitivity): 23 ng/L — ABNORMAL HIGH (ref <18)

## 2020-03-15 LAB — PROCALCITONIN: Procalcitonin: <0.1 ng/mL

## 2020-03-15 MED ORDER — ENSURE ENLIVE PO LIQD: 237.0000 mL | Freq: Three times a day (TID) | ORAL | 12 refills | Status: DC

## 2020-03-15 MED ORDER — AMOXICILLIN-POT CLAVULANATE 875-125 MG PO TABS: 1.0000 | ORAL_TABLET | Freq: Two times a day (BID) | ORAL | 0 refills | Status: AC

## 2020-03-15 NOTE — Progress Notes (Addendum)
Attempted to call report to facility x 1. Callback number left with Diplomatic Services operational officer.   1140 pm Attempted to call report x 2. PTAR set to arrive 1 hr

## 2020-03-15 NOTE — TOC Transition Note (Signed)
Transition of Care [facility]) - CM/SW Discharge Note   Patient Details  Name: Chad Duke MRN: [id number] Date of Birth: 08/22/1945  Transition of Care [facility]) CM/SW Contact:  Mearl Latin, LCSW Phone Number: 03/15/2020, 10:22 AM   Clinical Narrative:    Patient will DC to: [facility] Anticipated DC date: 03/15/20 Family notified: Niece Transport by: Sharin Mons   Per MD patient ready for DC to [facility]. RN to call report prior to discharge [phone]). RN, patient, patient's family, and facility notified of DC. Discharge Summary and FL2 sent to facility. DC packet on chart. Ambulance transport requested for patient.   CSW will sign off for now as social work intervention is no longer needed. Please consult [place] again if new needs arise.      Final next level of care: Skilled Nursing Facility Barriers to Discharge: No Barriers Identified   Patient Goals and CMS Choice Patient states their goals for this hospitalization and ongoing recovery are:: Return to snf CMS Medicare.gov Compare Post Acute Care list provided to:: Patient Represenative (must comment) Choice offered to / list presented to : [facility] POA / Guardian (Niece)  Discharge Placement   Existing PASRR number confirmed : 03/14/20          Patient [facility] bed at: [facility] Patient to be transferred to facility by: [facility] Name of family member notified: Niece, Heidi Patient and family notified of of transfer: 03/14/20  Discharge Plan and Services In-house Referral: Clinical Social Work   Post Acute Care Choice: Skilled Nursing Facility                               Social Determinants of Health (SDOH) Interventions     Readmission Risk Interventions Readmission Risk Prevention Plan 03/14/2020  Transportation Screening Complete  Medication Review [employer]) Referral to Pharmacy  PCP or Specialist appointment within 3-5 days of discharge Complete  HRI or Home Care Consult Complete  SW Recovery  Care/Counseling Consult Complete  Palliative Care Screening Not Applicable  Skilled Nursing Facility Complete  Some recent data might be hidden

## 2020-03-15 NOTE — Plan of Care (Signed)
Pt is Chad Duke; sats above 95% on room air; pt condition changed at beginning of shift; HR 110-120s; RR 30-40s; temp 99.1; CBG 211; cardiac monitor showed STE; MD notified, stats EKG, labs and chest x ray performed, see mar; pt unstable for discharge to SNF; per MD continue to monitor for progress and reevaluate for future discharge.   Bed is locked in lowest position; side rails up; call bell is within reach; tele sitter in place; continue to monitor and follow plan of care.     Problem: Education: Goal: Knowledge of General Education information will improve Description: Including pain rating scale, medication(s)/side effects and non-pharmacologic comfort measures Outcome: Progressing   Problem: Health Behavior/Discharge Planning: Goal: Ability to manage health-related needs will improve Outcome: Progressing   Problem: Clinical Measurements: Goal: Ability to maintain clinical measurements within normal limits will improve Outcome: Progressing Goal: Will remain free from infection Outcome: Progressing Goal: Diagnostic test results will improve Outcome: Progressing Goal: Respiratory complications will improve Outcome: Progressing Goal: Cardiovascular complication will be avoided Outcome: Progressing   Problem: Activity: Goal: Risk for activity intolerance will decrease Outcome: Progressing   Problem: Nutrition: Goal: Adequate nutrition will be maintained Outcome: Progressing   Problem: Coping: Goal: Level of anxiety will decrease Outcome: Progressing   Problem: Elimination: Goal: Will not experience complications related to bowel motility Outcome: Progressing Goal: Will not experience complications related to urinary retention Outcome: Progressing   Problem: Pain Managment: Goal: General experience of comfort will improve Outcome: Progressing   Problem: Safety: Goal: Ability to remain free from injury will improve Outcome: Progressing   Problem: Skin  Integrity: Goal: Risk for impaired skin integrity will decrease Outcome: Progressing   Problem: Education: Goal: Knowledge of risk factors and measures for prevention of condition will improve Outcome: Progressing   Problem: Coping: Goal: Psychosocial and spiritual needs will be supported Outcome: Progressing   Problem: Respiratory: Goal: Will maintain a patent airway Outcome: Progressing Goal: Complications related to the disease process, condition or treatment will be avoided or minimized Outcome: Progressing   Problem: Safety: Goal: Non-violent Restraint(s) Outcome: Progressing

## 2020-03-15 NOTE — Progress Notes (Signed)
Occupational Therapy Treatment Patient Details Name: Chad Duke MRN: [id number] DOB: [birth year] Today's Date: 03/15/2020    History of present illness 75 y.o. male who lives at [facility] care and has a h/o chronic diastolic CHF, AAA, HTN, HLD, PE, rheumatoid arthritis, hypogonadism, depression, nephrolithiasis, COPD on 2 L O2 and OSA. In ED 02/20/20 for 2x fall and AMS. Sent from SNF [facility]) 03/02/20 for confusion and progressively poor oral intake. In ED found to have a left facial swelling, is febrile and tachycardic. Niece mentioned that pt was diagnosed COVID+ 2 weeks ago at facility. Released from COVID isolation at his facility a week ago. Admitted for treatment of severe sepsis secondary to L parotitis and acute metabolic encephalopathy.   OT comments  Pt. Seen with PT for skilled therapy session to address bed mobility and seated adls for further work on sitting balance and following one step commands.  Max/total a x2 for sitting balance and completion of LB dressing.  Note d/c likely later today.    Follow Up Recommendations  SNF    Equipment Recommendations  Other (comment)    Recommendations for Other Services      Precautions / Restrictions Precautions Precautions: Fall       Mobility Bed Mobility Overal bed mobility: Needs Assistance Bed Mobility: Rolling;Sidelying to Sit;Sit to Sidelying Rolling: Max assist;+2 for physical assistance;+2 for safety/equipment   Supine to sit: Max assist;+2 for physical assistance;+2 for safety/equipment Sit to supine: Max assist;+2 for physical assistance;+2 for safety/equipment      Transfers                      Balance                                           ADL either performed or assessed with clinical judgement   ADL Overall ADL's : Needs assistance/impaired                     Lower Body Dressing: Maximal assistance;Total assistance;Sitting/lateral leans;Cueing  for sequencing Lower Body Dressing Details (indicate cue type and reason): don b socks, assistance to keep leg crossed over knee and to pull sock over toes and foot   Toilet Transfer Details (indicate cue type and reason): max x2 for urinal placement (pt. indicated he needed "to pee") then bed pan placement (also indicated that he needed to go to the b.room)           General ADL Comments: max/total for LB dressing task while focusing on promoting sitting balance and folowing commands. more alert today, answering some questions appropriately     Vision       Perception     Praxis      Cognition Arousal/Alertness: Awake/alert Behavior During Therapy: Impulsive;Restless Overall Cognitive Status: History of cognitive impairments - at baseline                                          Exercises     Shoulder Instructions       General Comments      Pertinent Vitals/ Pain       Pain Assessment: No/denies pain  Home Living  Prior Functioning/Environment              Frequency  Min 2X/week        Progress Toward Goals  OT Goals(current goals can now be found in the care plan section)  Progress towards OT goals: Progressing toward goals     Plan Discharge plan remains appropriate    Co-evaluation    PT/OT/SLP Co-Evaluation/Treatment: Yes Reason for Co-Treatment: To address functional/ADL transfers;For patient/therapist safety   OT goals addressed during session: ADL's and self-care      AM-PAC OT "6 Clicks" Daily Activity     Outcome Measure   Help from another person eating meals?: Total Help from another person taking care of personal grooming?: A Lot Help from another person toileting, which includes using toliet, bedpan, or urinal?: Total Help from another person bathing (including washing, rinsing, drying)?: Total Help from another person to put on and taking off  regular upper body clothing?: Total Help from another person to put on and taking off regular lower body clothing?: Total 6 Click Score: 7    End of Session    OT Visit Diagnosis: Other abnormalities of gait and mobility (R26.89);Muscle weakness (generalized) (M62.81);Other symptoms and signs involving cognitive function   Activity Tolerance Patient tolerated treatment well   Patient Left in bed;with call bell/phone within reach;with bed alarm set   Nurse Communication Other (comment) (alerted cna, pt. needs new condom cath, reviewed he had used the urnial, reviewed he was on bed pan and to get him off of it when finished using the b.room)        Time: 1015-1030 OT Time Calculation (min): 15 min  Charges: OT General Charges $OT Visit: 1 Visit OT Treatments $Self Care/Home Management : 8-22 mins  Boneta Lucks, COTA/L Acute Rehabilitation [phone]   Robet Leu 03/15/2020, 12:57 PM

## 2020-03-15 NOTE — Discharge Summary (Signed)
PATIENT DETAILS Name: Chad Duke Age: 75 y.o. Sex: male Date of Birth: [birth year] MRN: [id number]. Admitting Physician: Calvert Cantor, MD MVH:[id number], Resa Miner, FNP  Admit Date: 03/02/2020 Discharge date: 03/15/2020  Recommendations for Outpatient Follow-up:  1. Follow up with PCP in 1-2 weeks 2. Please obtain CMP/CBC in one week 3. Please ensure follow-up with ENT. 4. Consider palliative care evaluation while at SNF   Admitted From:  SNF  Disposition: SNF   Home Health: No  Equipment/Devices: None  Discharge Condition: Stable  CODE STATUS: DNR  Diet recommendation:  Diet Order            Diet - low sodium heart healthy           Diet Carb Modified           DIET DYS 2 Room service appropriate? Yes with Assist; Fluid consistency: Thin  Diet effective now                  Brief Narrative:  75 year old residing at [facility] health care, chronic diastolic CHF, HTN, AAA, PE, rheumatoid arthritis, hypogonadism, depression, COPD on chronic 2 L nasal cannula, OSA, HLD was sent to the hospital for poor oral intake and left-sided facial swelling concerning for parotitis.  Patient was diagnosed of COVID 2 weeks ago at [facility] and was treated at the facility.  Patient was seen by ENT in the ER started on Decadron; warm compresses and Unasyn.   Patient gradually improved-however further hospital course complicated by development of encephalopathy. See below for further details.  Significant studies: 1/22>> chest x-ray: No active disease 1/22>> CT C-spine: No acute traumatic injury to C-spine. 1/22>> CT head: No acute intracranial abnormality. 1/22>> CT maxillofacial: Anterior dislocation of the left mandibular condyle/extensive soft tissue swelling. 1/26>> MRI brain: No acute intracranial abnormality. 1/28>> ammonia: Normal 1/28>> TSH: Normal 1/28>> RPR: Nonreactive 1/29>> EEG: No seizures. 1/31>> vitamin B12: 469 2/3>> chest x-ray: Right> left bibasilar  opacities  Antibiotics: Unasyn: 1/22>> 1/31 Vancomycin: 1/22 x 1 Rocephin/Zithromax:2/3 x1 Augmentin:2/4>>x 5 days  Microbiology: 1/22>> urine culture: Negative 1/22>> blood culture: Negative 1/23>> blood culture: Negative  Brief Hospital Course: Severe sepsis secondary to left parotitis: Sepsis physiology has resolved-no significant left facial swelling observed-has completed a course of Unasyn, and Decadron.  Subluxation of left mandibular condyle: No inpatient work-up recommended-outpatient follow-up with ENT  Acute metabolic encephalopathy:  Gradually improved-he is much more awake and alert compared to the past few days-and actually spoke to his niece Heidi over the phone on 2/3. Neurology evaluation completed-see work-up above. S/p high-dose thiamine x3 days-remains on Seroquel.  Suspect his mentation will continue to gradually improve over the next several weeks.  Possible HCAP: Plans were to discharge on 2/3-however patient developed cough/mild tachypnea-chest x-ray done on 2/3 showed possible right lower pneumonia-started on Rocephin/Zithromax-however this morning patient looks very stable-is actually on room air (on 2-2 L of oxygen at baseline)-we will switch to Augmentin and discharged to SNF today.  SLP has followed-and recommendations are to pursue a dysphagia 2 diet.  Not sure if this is aspiration pneumonia given significant encephalopathy over the past few days.  AKI on CKD stage III: AKI hemodynamically mediated-improved-creatinine close to baseline.  Anemia: Secondary to acute illness-superimposed on anemia due to kidney disease-no overt bleeding-follow.  Thrombocytopenia: Could be due to COVID-19 infection-slowly improving.  COPD with chronic hypoxemic respiratory failure on 2-3 L of oxygen at home: Stable-continue bronchodilators  History of PE: On Eliquis  History of rheumatoid arthritis:  On Remicade infusion as outpatient.  DM-2: CBG stable on SSI.   Resume Metformin on discharge  Obesity: Estimated body mass index is 32.58 kg/m as calculated from the following:   Height as of this encounter: 5\' 8"  (1.727 m).   Weight as of this encounter: 97.2 kg.   RN pressure injury documentation: Pressure Injury 03/06/20 Heel Left Deep Tissue Pressure Injury - Purple or maroon localized area of discolored intact skin or blood-filled blister due to damage of underlying soft tissue from pressure and/or shear. (Active)  03/06/20 1932  Location: Heel  Location Orientation: Left  Staging: Deep Tissue Pressure Injury - Purple or maroon localized area of discolored intact skin or blood-filled blister due to damage of underlying soft tissue from pressure and/or shear.  Wound Description (Comments):   Present on Admission:      Discharge Diagnoses:  Principal Problem:   Severe sepsis (HCC) Active Problems:   COPD GOLD III    Sleep apnea   Rheumatoid arthritis (HCC)   Morbid obesity due to excess calories (HCC)   History of pulmonary embolism   Acute renal failure superimposed on stage 3 chronic kidney disease (HCC)   Controlled type 2 diabetes mellitus with hyperglycemia (HCC)   Parotitis   Discharge Instructions: Check CBGs before meals and at bedtime      Person Under Monitoring Name: Chad Duke  Location: [address] [place]   Infection Prevention Recommendations for Individuals Confirmed to have, or Being Evaluated for, 2019 Novel Coronavirus (COVID-19) Infection Who Receive Care at Home  Individuals who are confirmed to have, or are being evaluated for, COVID-19 should follow the prevention steps below until a healthcare provider or local or state health department says they can return to normal activities.  Stay home except to get medical care You should restrict activities outside your home, except for getting medical care. Do not go to work, school, or public areas, and do not use public  transportation or taxis.  Call ahead before visiting your doctor Before your medical appointment, call the healthcare provider and tell them that you have, or are being evaluated for, COVID-19 infection. This will help the healthcare provider's office take steps to keep other people from getting infected. Ask your healthcare provider to call the local or state health department.  Monitor your symptoms Seek prompt medical attention if your illness is worsening (e.g., difficulty breathing). Before going to your medical appointment, call the healthcare provider and tell them that you have, or are being evaluated for, COVID-19 infection. Ask your healthcare provider to call the local or state health department.  Wear a facemask You should wear a facemask that covers your nose and mouth when you are in the same room with other people and when you visit a healthcare provider. People who live with or visit you should also wear a facemask while they are in the same room with you.  Separate yourself from other people in your home As much as possible, you should stay in a different room from other people in your home. Also, you should use a separate bathroom, if available.  Avoid sharing household items You should not share dishes, drinking glasses, cups, eating utensils, towels, bedding, or other items with other people in your home. After using these items, you should wash them thoroughly with soap and water.  Cover your coughs and sneezes Cover your mouth and nose with a tissue when you cough or sneeze, or you can cough or sneeze  into your sleeve. Throw used tissues in a lined trash can, and immediately wash your hands with soap and water for at least 20 seconds or use an alcohol-based hand rub.  Wash your [employer] your hands often and thoroughly with soap and water for at least 20 seconds. You can use an alcohol-based hand sanitizer if soap and water are not available and if your hands  are not visibly dirty. Avoid touching your eyes, nose, and mouth with unwashed hands.   Prevention Steps for Caregivers and Household Members of Individuals Confirmed to have, or Being Evaluated for, COVID-19 Infection Being Cared for in the Home  If you live with, or provide care at home for, a person confirmed to have, or being evaluated for, COVID-19 infection please follow these guidelines to prevent infection:  Follow healthcare provider's instructions Make sure that you understand and can help the patient follow any healthcare provider instructions for all care.  Provide for the patient's basic needs You should help the patient with basic needs in the home and provide support for getting groceries, prescriptions, and other personal needs.  Monitor the patient's symptoms If they are getting sicker, call his or her medical provider and tell them that the patient has, or is being evaluated for, COVID-19 infection. This will help the healthcare provider's office take steps to keep other people from getting infected. Ask the healthcare provider to call the local or state health department.  Limit the number of people who have contact with the patient  If possible, have only one caregiver for the patient.  Other household members should stay in another home or place of residence. If this is not possible, they should stay  in another room, or be separated from the patient as much as possible. Use a separate bathroom, if available.  Restrict visitors who do not have an essential need to be in the home.  Keep older adults, very young children, and other sick people away from the patient Keep older adults, very young children, and those who have compromised immune systems or chronic health conditions away from the patient. This includes people with chronic heart, lung, or kidney conditions, diabetes, and cancer.  Ensure good ventilation Make sure that shared spaces in the home have  good air flow, such as from an air conditioner or an opened window, weather permitting.  Wash your hands often  Wash your hands often and thoroughly with soap and water for at least 20 seconds. You can use an alcohol based hand sanitizer if soap and water are not available and if your hands are not visibly dirty.  Avoid touching your eyes, nose, and mouth with unwashed hands.  Use disposable paper towels to dry your hands. If not available, use dedicated cloth towels and replace them when they become wet.  Wear a facemask and gloves  Wear a disposable facemask at all times in the room and gloves when you touch or have contact with the patient's blood, body fluids, and/or secretions or excretions, such as sweat, saliva, sputum, nasal mucus, vomit, urine, or feces.  Ensure the mask fits over your nose and mouth tightly, and do not touch it during use.  Throw out disposable facemasks and gloves after using them. Do not reuse.  Wash your hands immediately after removing your facemask and gloves.  If your personal clothing becomes contaminated, carefully remove clothing and launder. Wash your hands after handling contaminated clothing.  Place all used disposable facemasks, gloves, and other waste in  a lined container before disposing them with other household waste.  Remove gloves and wash your hands immediately after handling these items.  Do not share dishes, glasses, or other household items with the patient  Avoid sharing household items. You should not share dishes, drinking glasses, cups, eating utensils, towels, bedding, or other items with a patient who is confirmed to have, or being evaluated for, COVID-19 infection.  After the person uses these items, you should wash them thoroughly with soap and water.  Wash laundry thoroughly  Immediately remove and wash clothes or bedding that have blood, body fluids, and/or secretions or excretions, such as sweat, saliva, sputum, nasal  mucus, vomit, urine, or feces, on them.  Wear gloves when handling laundry from the patient.  Read and follow directions on labels of laundry or clothing items and detergent. In general, wash and dry with the warmest temperatures recommended on the label.  Clean all areas the individual has used often  Clean all touchable surfaces, such as counters, tabletops, doorknobs, bathroom fixtures, toilets, phones, keyboards, tablets, and bedside tables, every day. Also, clean any surfaces that may have blood, body fluids, and/or secretions or excretions on them.  Wear gloves when cleaning surfaces the patient has come in contact with.  Use a diluted bleach solution (e.g., dilute bleach with 1 part bleach and 10 parts water) or a household disinfectant with a label that says EPA-registered for coronaviruses. To make a bleach solution at home, add 1 tablespoon of bleach to 1 quart (4 cups) of water. For a larger supply, add  cup of bleach to 1 gallon (16 cups) of water.  Read labels of cleaning products and follow recommendations provided on product labels. Labels contain instructions for safe and effective use of the cleaning product including precautions you should take when applying the product, such as wearing gloves or eye protection and making sure you have good ventilation during use of the product.  Remove gloves and wash hands immediately after cleaning.  Monitor yourself for signs and symptoms of illness Caregivers and household members are considered close contacts, should monitor their health, and will be asked to limit movement outside of the home to the extent possible. Follow the monitoring steps for close contacts listed on the symptom monitoring form.   ? If you have additional questions, contact your local health department or call the epidemiologist on call at [phone] (available 24/7). ? This guidance is subject to change. For the most up-to-date guidance from CDC, please  refer to their website: [employer]Met[employer].hu    Activity:  As tolerated with Full fall precautions use walker/cane & assistance as needed  Discharge Instructions    Call MD for:  extreme fatigue   Complete by: As directed    Diet - low sodium heart healthy   Complete by: As directed    Diet Carb Modified   Complete by: As directed    Discharge wound care:   Complete by: As directed    1.)Wound care  Daily      Comments: Clean right shin wound with soap and water, dry and place a piece of Xeroform gauze [place] # 294) over the wound, cover with 4x4 then wrap with Kerlix. Change daily.    2.)Wound care  Every shift      Comments: Continue foam dressing to left heel. Pull down each shift to assess. Change every 3 days. Place both heels in Prevalon boots.   Increase activity slowly   Complete by: As directed  Allergies as of 03/15/2020      Reactions   Varenicline Other (See Comments)   Suicidal thoughts Bad dreams Other reaction(s): Other (See Comments) Suicidal thoughts   Gabapentin Other (See Comments)   dizziness      Medication List    STOP taking these medications   dexamethasone 6 MG tablet Commonly known as: DECADRON   furosemide 40 MG tablet Commonly known as: LASIX   potassium chloride SA 20 MEQ tablet Commonly known as: KLOR-CON   Remdesivir 100 MG/20ML Soln   Zinc 220 (50 Zn) MG Caps     TAKE these medications   albuterol 108 (90 Base) MCG/ACT inhaler Commonly known as: VENTOLIN HFA Inhale 2 puffs into the lungs every 4 (four) hours as needed for wheezing or shortness of breath.   allopurinol 100 MG tablet Commonly known as: ZYLOPRIM Take 100 mg by mouth daily.   amoxicillin-clavulanate 875-125 MG tablet Commonly known as: Augmentin Take 1 tablet by mouth 2 (two) times daily for 5 days. What changed:   when to take this  additional instructions   apixaban 5 MG Tabs tablet Commonly  known as: Eliquis Take 1 tablet (5 mg total) by mouth 2 (two) times daily.   atorvastatin 40 MG tablet Commonly known as: LIPITOR Take 1 tablet (40 mg total) by mouth daily. What changed: when to take this   budesonide 0.25 MG/2ML nebulizer solution Commonly known as: PULMICORT Take 0.25 mg by nebulization 2 (two) times daily.   buPROPion 150 MG 12 hr tablet Commonly known as: ZYBAN Take 1 tablet (150 mg total) by mouth 2 (two) times daily.   CALCIUM-MAGNESIUM-VITAMIN D PO Take 1 tablet by mouth daily.   feeding supplement Liqd Take 237 mLs by mouth 3 (three) times daily between meals. What changed: when to take this   folic acid 1 MG tablet Commonly known as: FOLVITE Take 2 tablets (2 mg total) by mouth daily.   insulin aspart 100 UNIT/ML injection Commonly known as: novoLOG 0-15 Units, Subcutaneous, 3 times daily with meals CBG < 70: Implement Hypoglycemia measures CBG 70 - 120: 0 units CBG 121 - 150: 2 units CBG 151 - 200: 3 units CBG 201 - 250: 5 units CBG 251 - 300: 8 units CBG 301 - 350: 11 units CBG 351 - 400: 15 units CBG > 400: call MD   ipratropium-albuterol 0.5-2.5 (3) MG/3ML Soln Commonly known as: DUONEB Take 3 mLs by nebulization 3 (three) times daily. What changed: Another medication with the same name was removed. Continue taking this medication, and follow the directions you see here.   Magnesium 400 MG Caps Take 400 mg by mouth at bedtime.   metFORMIN 500 MG tablet Commonly known as: GLUCOPHAGE Take 500 mg by mouth 2 (two) times daily.   pantoprazole 40 MG tablet Commonly known as: PROTONIX Take 40 mg by mouth daily.   QUEtiapine 25 MG tablet Commonly known as: SEROQUEL Take 1 tablet (25 mg total) by mouth at bedtime.   Symbicort 160-4.5 MCG/ACT inhaler Generic drug: budesonide-formoterol Inhale 2 puffs into the lungs in the morning and at bedtime.   Vitamin D-3 125 MCG (5000 UT) Tabs Take 5,000 Units by mouth daily.             Discharge Care Instructions  (From admission, onward)         Start     Ordered   03/14/20 0000  Discharge wound care:       Comments: 1.)Wound care  Daily  Comments: Clean right shin wound with soap and water, dry and place a piece of Xeroform gauze [place] # 294) over the wound, cover with 4x4 then wrap with Kerlix. Change daily.    2.)Wound care  Every shift      Comments: Continue foam dressing to left heel. Pull down each shift to assess. Change every 3 days. Place both heels in Prevalon boots.   03/14/20 1058          Contact information for follow-up providers    Soundra Pilon, FNP. Schedule an appointment as soon as possible for a visit in 1 week(s).   Specialty: Family Medicine Contact information: [address] [place] [address] [phone]        Lewayne Bunting, MD Follow up in 4 week(s).   Specialty: Cardiology Contact information: [address] [place] [address] [phone]            Contact information for after-discharge care    Destination    [facility] Preferred SNF .   Service: Skilled Nursing Contact information: [address] [place] [address] [phone]                 Allergies  Allergen Reactions  . Varenicline Other (See Comments)    Suicidal thoughts Bad dreams Other reaction(s): Other (See Comments) Suicidal thoughts  . Gabapentin Other (See Comments)    dizziness    Other Procedures/Studies: CT Head Wo Contrast  Result Date: 03/02/2020 CLINICAL DATA:  "Head trauma, minor". EXAM: CT HEAD WITHOUT CONTRAST TECHNIQUE: Contiguous axial images were obtained from the base of the skull through the vertex without intravenous contrast. COMPARISON:  02/20/2020. FINDINGS: Brain: Advanced cerebral and cerebellar atrophy. Moderate low density in the periventricular white matter likely related to small vessel disease. No mass lesion, hemorrhage, hydrocephalus, acute  infarct, intra-axial, or extra-axial fluid collection. Vascular: Intracranial atherosclerosis. Skull: No scalp soft tissue swelling.  No skull fracture. Sinuses/Orbits: Evaluated on dedicated face CT, dictated separately. Other: None. IMPRESSION: 1. No acute intracranial abnormality. 2. Cerebral atrophy and small vessel ischemic change. Electronically Signed   By: Jeronimo Greaves M.D.   On: 03/02/2020 14:53   CT Head Wo Contrast  Result Date: 02/20/2020 CLINICAL DATA:  Fall in lobby Head trauma, minor (Age >= 65y) second fall, on Eliquis EXAM: CT HEAD WITHOUT CONTRAST TECHNIQUE: Contiguous axial images were obtained from the base of the skull through the vertex without intravenous contrast. COMPARISON:  None. FINDINGS: Brain: No acute intracranial hemorrhage. No focal mass lesion. No CT evidence of acute infarction. No midline shift or mass effect. No hydrocephalus. Basilar cisterns are patent. There are periventricular and subcortical white matter hypodensities. Generalized cortical atrophy. Vascular: No hyperdense vessel or unexpected calcification. Skull: Normal. Negative for fracture or focal lesion. Sinuses/Orbits: Paranasal sinuses and mastoid air cells are clear. Orbits are clear. Other: None. IMPRESSION: 1. No intracranial trauma. 2. Atrophy and white matter microvascular disease. 3. No acute findings. Electronically Signed   By: Genevive Bi M.D.   On: 02/20/2020 07:39   CT Head Wo Contrast  Result Date: 02/19/2020 CLINICAL DATA:  75 year old male with head trauma. EXAM: CT HEAD WITHOUT CONTRAST TECHNIQUE: Contiguous axial images were obtained from the base of the skull through the vertex without intravenous contrast. COMPARISON:  Head CT dated 01/21/2020. FINDINGS: Brain: Mild age-related atrophy and chronic microvascular ischemic changes. There is no acute intracranial hemorrhage. No mass effect or midline shift. No extra-axial fluid collection. Vascular:  No hyperdense vessel or unexpected  calcification. Skull: Normal. Negative for fracture or focal lesion. Sinuses/Orbits: No acute finding. Other: None IMPRESSION: 1. No acute intracranial pathology. 2. Mild age-related atrophy and chronic microvascular ischemic changes. Electronically Signed   By: Elgie Collard M.D.   On: 02/19/2020 21:13   CT Cervical Spine Wo Contrast  Result Date: 03/02/2020 CLINICAL DATA:  75 year old male with history of neck trauma from a fall on 02/28/2020. On Eliquis. Swelling in the left side of face. EXAM: CT MAXILLOFACIAL WITHOUT CONTRAST CT CERVICAL SPINE WITHOUT CONTRAST TECHNIQUE: Multidetector CT imaging of the maxillofacial structures was performed. Multiplanar CT image reconstructions were also generated. A small metallic BB was placed on the right temple in order to reliably differentiate right from left. Multidetector CT imaging of the cervical spine was performed without intravenous contrast. Multiplanar CT image reconstructions were also generated. COMPARISON:  Head CT 03/02/2020. FINDINGS: CT MAXILLOFACIAL FINDINGS Osseous: No acute displaced fracture. Left mandibular condyle is anteriorly dislocated (sagittal image 76 of series 11). No destructive process. Orbits: Negative. No traumatic or inflammatory finding. Sinuses: Clear. Soft tissues: A extensive soft tissue swelling in the subcutaneous fat of the left side of the face lateral to the left mandible. Limited intracranial: See separate report for recently obtained head CT dated 03/02/2020 for full description of intracranial findings. CT CERVICAL FINDINGS Alignment: Normal. Skull base and vertebrae: No acute fracture. No primary bone lesion or focal pathologic process. Soft tissues and spinal canal: No prevertebral fluid or swelling. No visible canal hematoma. Disc levels: Mild multilevel degenerative disc disease, most apparent at C5-C6. Mild multilevel facet arthropathy. Upper chest: Unremarkable. Other: None. IMPRESSION: 1. Anterior dislocation of  the left mandibular condyle from the temporomandibular joint with extensive overlying soft tissue swelling. 2. No evidence of significant acute traumatic injury to the cervical spine. Electronically Signed   By: Trudie Reed M.D.   On: 03/02/2020 15:14   MR BRAIN WO CONTRAST  Result Date: 03/06/2020 CLINICAL DATA:  Delirium. Currently being treated for acute left parotitis with sepsis. EXAM: MRI HEAD WITHOUT CONTRAST TECHNIQUE: Multiplanar, multiecho pulse sequences of the brain and surrounding structures were obtained without intravenous contrast. COMPARISON:  Head and maxillofacial CTs 03/02/2020. Head MRI 01/19/2020. FINDINGS: Brain: There is no evidence of an acute infarct, intracranial hemorrhage, mass, midline shift, or extra-axial fluid collection. T2 hyperintensities in the cerebral white matter and pons are similar to the prior MRI and are nonspecific but compatible with moderate chronic small vessel ischemic disease. There is moderate cerebral atrophy. Vascular: Major intracranial vascular flow voids are preserved. Skull and upper cervical spine: Unremarkable bone marrow signal. Sinuses/Orbits: Unremarkable orbits. Paranasal sinuses and mastoid air cells are clear. Other: Left parotid swelling consistent related to known parotitis. IMPRESSION: 1. No acute intracranial abnormality. 2. Moderate chronic small vessel ischemic disease and cerebral atrophy. 3. Acute left parotitis. Electronically Signed   By: Sebastian Ache M.D.   On: 03/06/2020 17:20   DG CHEST PORT 1 VIEW  Result Date: 03/14/2020 CLINICAL DATA:  Respiratory distress EXAM: PORTABLE CHEST 1 VIEW COMPARISON:  03/02/2020 FINDINGS: Mild cardiomegaly. Right-greater-than-left bibasilar opacities, likely atelectasis or early consolidation. No pneumothorax or sizable pleural effusion. IMPRESSION: Right-greater-than-left bibasilar opacities, likely atelectasis or early consolidation. Electronically Signed   By: Deatra Robinson M.D.   On:  03/14/2020 21:08   DG Chest Port 1 View  Result Date: 03/02/2020 CLINICAL DATA:  Dyspnea EXAM: PORTABLE CHEST 1 VIEW COMPARISON:  January 21, 2020 FINDINGS: The heart size and mediastinal  contours are stable. The aorta is tortuous. Both lungs are clear. Chronic deformity of several lower right ribs are noted. IMPRESSION: No active disease. Electronically Signed   By: Sherian Rein M.D.   On: 03/02/2020 12:28   EEG adult  Result Date: 03/09/2020 Charlsie Quest, MD     03/09/2020  9:04 AM Patient Name: Chad Duke MRN: [id number] Epilepsy Attending: Charlsie Quest Referring Physician/Provider: Dr Huey Bienenstock Date: 03/08/2020 Duration: 22.24 mins Patient history: 75yo M with ams, EEG to evaluate for seizure Level of alertness: Awake AEDs during EEG study: None Technical aspects: This EEG study was done with scalp electrodes positioned according to the 10-20 International system of electrode placement. Electrical activity was acquired at a sampling rate of 500Hz  and reviewed with a high frequency filter of 70Hz  and a low frequency filter of 1Hz . EEG data were recorded continuously and digitally stored. Description: No posterior dominant rhythm was seen. EEG showed continuous generalized 5 to 6 Hz theta as well as intermittent 2-3hz  delta slowing.  Hyperventilation and photic stimulation were not performed.   ABNORMALITY -Continuous slow, generalized IMPRESSION: This study is suggestive of mild to moderate diffuse encephalopathy, nonspecific etiology. No seizures or epileptiform discharges were seen throughout the recording. Priyanka Annabelle Harman   CT MAXILLOFACIAL WO CONTRAST  Result Date: 03/02/2020 CLINICAL DATA:  75 year old male with history of neck trauma from a fall on 02/28/2020. On Eliquis. Swelling in the left side of face. EXAM: CT MAXILLOFACIAL WITHOUT CONTRAST CT CERVICAL SPINE WITHOUT CONTRAST TECHNIQUE: Multidetector CT imaging of the maxillofacial structures was performed. Multiplanar CT  image reconstructions were also generated. A small metallic BB was placed on the right temple in order to reliably differentiate right from left. Multidetector CT imaging of the cervical spine was performed without intravenous contrast. Multiplanar CT image reconstructions were also generated. COMPARISON:  Head CT 03/02/2020. FINDINGS: CT MAXILLOFACIAL FINDINGS Osseous: No acute displaced fracture. Left mandibular condyle is anteriorly dislocated (sagittal image 76 of series 11). No destructive process. Orbits: Negative. No traumatic or inflammatory finding. Sinuses: Clear. Soft tissues: A extensive soft tissue swelling in the subcutaneous fat of the left side of the face lateral to the left mandible. Limited intracranial: See separate report for recently obtained head CT dated 03/02/2020 for full description of intracranial findings. CT CERVICAL FINDINGS Alignment: Normal. Skull base and vertebrae: No acute fracture. No primary bone lesion or focal pathologic process. Soft tissues and spinal canal: No prevertebral fluid or swelling. No visible canal hematoma. Disc levels: Mild multilevel degenerative disc disease, most apparent at C5-C6. Mild multilevel facet arthropathy. Upper chest: Unremarkable. Other: None. IMPRESSION: 1. Anterior dislocation of the left mandibular condyle from the temporomandibular joint with extensive overlying soft tissue swelling. 2. No evidence of significant acute traumatic injury to the cervical spine. Electronically Signed   By: Trudie Reed M.D.   On: 03/02/2020 15:14     TODAY-DAY OF DISCHARGE:  Subjective:   Chad Duke today has no headache,no chest abdominal pain,no new weakness tingling or numbness, feels much better wants to go home today.   Objective:   Blood pressure 127/76, pulse 93, temperature 98.1 F (36.7 C), temperature source Axillary, resp. rate 20, height 5\' 8"  (1.727 m), weight 97.2 kg, SpO2 93 %.  Intake/Output Summary (Last 24 hours) at  03/15/2020 1005 Last data filed at 03/15/2020 0843 Gross per 24 hour  Intake 530 ml  Output 500 ml  Net 30 ml   Filed Weights   03/02/20 1146 03/04/20 0318  Weight: 99.8 kg 97.2 kg    Exam: Awake Alert, Oriented *3, No new F.N deficits, Normal affect [place].AT,PERRAL Supple Neck,No JVD, No cervical lymphadenopathy appriciated.  Symmetrical Chest wall movement, Good air movement bilaterally, CTAB RRR,No Gallops,Rubs or new Murmurs, No Parasternal Heave +ve B.Sounds, Abd Soft, Non tender, No organomegaly appriciated, No rebound -guarding or rigidity. No Cyanosis, Clubbing or edema, No new Rash or bruise   PERTINENT RADIOLOGIC STUDIES: CT Head Wo Contrast  Result Date: 03/02/2020 CLINICAL DATA:  "Head trauma, minor". EXAM: CT HEAD WITHOUT CONTRAST TECHNIQUE: Contiguous axial images were obtained from the base of the skull through the vertex without intravenous contrast. COMPARISON:  02/20/2020. FINDINGS: Brain: Advanced cerebral and cerebellar atrophy. Moderate low density in the periventricular white matter likely related to small vessel disease. No mass lesion, hemorrhage, hydrocephalus, acute infarct, intra-axial, or extra-axial fluid collection. Vascular: Intracranial atherosclerosis. Skull: No scalp soft tissue swelling.  No skull fracture. Sinuses/Orbits: Evaluated on dedicated face CT, dictated separately. Other: None. IMPRESSION: 1. No acute intracranial abnormality. 2. Cerebral atrophy and small vessel ischemic change. Electronically Signed   By: Jeronimo Greaves M.D.   On: 03/02/2020 14:53   CT Head Wo Contrast  Result Date: 02/20/2020 CLINICAL DATA:  Fall in lobby Head trauma, minor (Age >= 65y) second fall, on Eliquis EXAM: CT HEAD WITHOUT CONTRAST TECHNIQUE: Contiguous axial images were obtained from the base of the skull through the vertex without intravenous contrast. COMPARISON:  None. FINDINGS: Brain: No acute intracranial hemorrhage. No focal mass lesion. No CT evidence of acute  infarction. No midline shift or mass effect. No hydrocephalus. Basilar cisterns are patent. There are periventricular and subcortical white matter hypodensities. Generalized cortical atrophy. Vascular: No hyperdense vessel or unexpected calcification. Skull: Normal. Negative for fracture or focal lesion. Sinuses/Orbits: Paranasal sinuses and mastoid air cells are clear. Orbits are clear. Other: None. IMPRESSION: 1. No intracranial trauma. 2. Atrophy and white matter microvascular disease. 3. No acute findings. Electronically Signed   By: Genevive Bi M.D.   On: 02/20/2020 07:39   CT Head Wo Contrast  Result Date: 02/19/2020 CLINICAL DATA:  75 year old male with head trauma. EXAM: CT HEAD WITHOUT CONTRAST TECHNIQUE: Contiguous axial images were obtained from the base of the skull through the vertex without intravenous contrast. COMPARISON:  Head CT dated 01/21/2020. FINDINGS: Brain: Mild age-related atrophy and chronic microvascular ischemic changes. There is no acute intracranial hemorrhage. No mass effect or midline shift. No extra-axial fluid collection. Vascular: No hyperdense vessel or unexpected calcification. Skull: Normal. Negative for fracture or focal lesion. Sinuses/Orbits: No acute finding. Other: None IMPRESSION: 1. No acute intracranial pathology. 2. Mild age-related atrophy and chronic microvascular ischemic changes. Electronically Signed   By: Elgie Collard M.D.   On: 02/19/2020 21:13   CT Cervical Spine Wo Contrast  Result Date: 03/02/2020 CLINICAL DATA:  75 year old male with history of neck trauma from a fall on 02/28/2020. On Eliquis. Swelling in the left side of face. EXAM: CT MAXILLOFACIAL WITHOUT CONTRAST CT CERVICAL SPINE WITHOUT CONTRAST TECHNIQUE: Multidetector CT imaging of the maxillofacial structures was performed. Multiplanar CT image reconstructions were also generated. A small metallic BB was placed on the right temple in order to reliably differentiate right from left.  Multidetector CT imaging of the cervical spine was performed without intravenous contrast. Multiplanar CT image reconstructions were also generated. COMPARISON:  Head CT 03/02/2020. FINDINGS: CT MAXILLOFACIAL FINDINGS Osseous: No acute displaced fracture. Left mandibular condyle is anteriorly dislocated (sagittal image 76 of series 11). No destructive  process. Orbits: Negative. No traumatic or inflammatory finding. Sinuses: Clear. Soft tissues: A extensive soft tissue swelling in the subcutaneous fat of the left side of the face lateral to the left mandible. Limited intracranial: See separate report for recently obtained head CT dated 03/02/2020 for full description of intracranial findings. CT CERVICAL FINDINGS Alignment: Normal. Skull base and vertebrae: No acute fracture. No primary bone lesion or focal pathologic process. Soft tissues and spinal canal: No prevertebral fluid or swelling. No visible canal hematoma. Disc levels: Mild multilevel degenerative disc disease, most apparent at C5-C6. Mild multilevel facet arthropathy. Upper chest: Unremarkable. Other: None. IMPRESSION: 1. Anterior dislocation of the left mandibular condyle from the temporomandibular joint with extensive overlying soft tissue swelling. 2. No evidence of significant acute traumatic injury to the cervical spine. Electronically Signed   By: Trudie Reed M.D.   On: 03/02/2020 15:14   MR BRAIN WO CONTRAST  Result Date: 03/06/2020 CLINICAL DATA:  Delirium. Currently being treated for acute left parotitis with sepsis. EXAM: MRI HEAD WITHOUT CONTRAST TECHNIQUE: Multiplanar, multiecho pulse sequences of the brain and surrounding structures were obtained without intravenous contrast. COMPARISON:  Head and maxillofacial CTs 03/02/2020. Head MRI 01/19/2020. FINDINGS: Brain: There is no evidence of an acute infarct, intracranial hemorrhage, mass, midline shift, or extra-axial fluid collection. T2 hyperintensities in the cerebral white matter  and pons are similar to the prior MRI and are nonspecific but compatible with moderate chronic small vessel ischemic disease. There is moderate cerebral atrophy. Vascular: Major intracranial vascular flow voids are preserved. Skull and upper cervical spine: Unremarkable bone marrow signal. Sinuses/Orbits: Unremarkable orbits. Paranasal sinuses and mastoid air cells are clear. Other: Left parotid swelling consistent related to known parotitis. IMPRESSION: 1. No acute intracranial abnormality. 2. Moderate chronic small vessel ischemic disease and cerebral atrophy. 3. Acute left parotitis. Electronically Signed   By: Sebastian Ache M.D.   On: 03/06/2020 17:20   DG CHEST PORT 1 VIEW  Result Date: 03/14/2020 CLINICAL DATA:  Respiratory distress EXAM: PORTABLE CHEST 1 VIEW COMPARISON:  03/02/2020 FINDINGS: Mild cardiomegaly. Right-greater-than-left bibasilar opacities, likely atelectasis or early consolidation. No pneumothorax or sizable pleural effusion. IMPRESSION: Right-greater-than-left bibasilar opacities, likely atelectasis or early consolidation. Electronically Signed   By: Deatra Robinson M.D.   On: 03/14/2020 21:08   DG Chest Port 1 View  Result Date: 03/02/2020 CLINICAL DATA:  Dyspnea EXAM: PORTABLE CHEST 1 VIEW COMPARISON:  January 21, 2020 FINDINGS: The heart size and mediastinal contours are stable. The aorta is tortuous. Both lungs are clear. Chronic deformity of several lower right ribs are noted. IMPRESSION: No active disease. Electronically Signed   By: Sherian Rein M.D.   On: 03/02/2020 12:28   EEG adult  Result Date: 03/09/2020 Charlsie Quest, MD     03/09/2020  9:04 AM Patient Name: Chad Duke MRN: [id number] Epilepsy Attending: Charlsie Quest Referring Physician/Provider: Dr Huey Bienenstock Date: 03/08/2020 Duration: 22.24 mins Patient history: 75yo M with ams, EEG to evaluate for seizure Level of alertness: Awake AEDs during EEG study: None Technical aspects: This EEG study was done  with scalp electrodes positioned according to the 10-20 International system of electrode placement. Electrical activity was acquired at a sampling rate of 500Hz  and reviewed with a high frequency filter of 70Hz  and a low frequency filter of 1Hz . EEG data were recorded continuously and digitally stored. Description: No posterior dominant rhythm was seen. EEG showed continuous generalized 5 to 6 Hz theta as well as intermittent 2-3hz  delta slowing.  Hyperventilation and photic stimulation were not performed.   ABNORMALITY -Continuous slow, generalized IMPRESSION: This study is suggestive of mild to moderate diffuse encephalopathy, nonspecific etiology. No seizures or epileptiform discharges were seen throughout the recording. Priyanka Annabelle Harman   CT MAXILLOFACIAL WO CONTRAST  Result Date: 03/02/2020 CLINICAL DATA:  75 year old male with history of neck trauma from a fall on 02/28/2020. On Eliquis. Swelling in the left side of face. EXAM: CT MAXILLOFACIAL WITHOUT CONTRAST CT CERVICAL SPINE WITHOUT CONTRAST TECHNIQUE: Multidetector CT imaging of the maxillofacial structures was performed. Multiplanar CT image reconstructions were also generated. A small metallic BB was placed on the right temple in order to reliably differentiate right from left. Multidetector CT imaging of the cervical spine was performed without intravenous contrast. Multiplanar CT image reconstructions were also generated. COMPARISON:  Head CT 03/02/2020. FINDINGS: CT MAXILLOFACIAL FINDINGS Osseous: No acute displaced fracture. Left mandibular condyle is anteriorly dislocated (sagittal image 76 of series 11). No destructive process. Orbits: Negative. No traumatic or inflammatory finding. Sinuses: Clear. Soft tissues: A extensive soft tissue swelling in the subcutaneous fat of the left side of the face lateral to the left mandible. Limited intracranial: See separate report for recently obtained head CT dated 03/02/2020 for full description of  intracranial findings. CT CERVICAL FINDINGS Alignment: Normal. Skull base and vertebrae: No acute fracture. No primary bone lesion or focal pathologic process. Soft tissues and spinal canal: No prevertebral fluid or swelling. No visible canal hematoma. Disc levels: Mild multilevel degenerative disc disease, most apparent at C5-C6. Mild multilevel facet arthropathy. Upper chest: Unremarkable. Other: None. IMPRESSION: 1. Anterior dislocation of the left mandibular condyle from the temporomandibular joint with extensive overlying soft tissue swelling. 2. No evidence of significant acute traumatic injury to the cervical spine. Electronically Signed   By: Trudie Reed M.D.   On: 03/02/2020 15:14     PERTINENT LAB RESULTS: CBC: Recent Labs    03/14/20 0159  WBC 7.1  HGB 8.1*  HCT 25.3*  PLT 154   CMET CMP     Component Value Date/Time   NA 137 03/14/2020 0159   NA 140 04/12/2018 1439   K 3.9 03/14/2020 0159   CL 106 03/14/2020 0159   CO2 24 03/14/2020 0159   GLUCOSE 106 (H) 03/14/2020 0159   BUN 15 03/14/2020 0159   BUN 23 04/12/2018 1439   CREATININE 1.37 (H) 03/14/2020 0159   CALCIUM 8.5 (L) 03/14/2020 0159   PROT 5.1 (L) 03/14/2020 0159   ALBUMIN 1.6 (L) 03/14/2020 0159   AST 29 03/14/2020 0159   ALT 36 03/14/2020 0159   ALKPHOS 92 03/14/2020 0159   BILITOT 0.8 03/14/2020 0159   GFRNONAA 54 (L) 03/14/2020 0159   GFRAA 54 (L) 11/02/2018 0937    GFR Estimated Creatinine Clearance: 52.7 mL/min (A) (by C-G formula based on SCr of 1.37 mg/dL (H)). No results for input(s): LIPASE, AMYLASE in the last 72 hours. No results for input(s): CKTOTAL, CKMB, CKMBINDEX, TROPONINI in the last 72 hours. Invalid input(s): POCBNP No results for input(s): DDIMER in the last 72 hours. No results for input(s): HGBA1C in the last 72 hours. No results for input(s): CHOL, HDL, LDLCALC, TRIG, CHOLHDL, LDLDIRECT in the last 72 hours. No results for input(s): TSH, T4TOTAL, T3FREE, THYROIDAB in the  last 72 hours.  Invalid input(s): FREET3 No results for input(s): VITAMINB12, FOLATE, FERRITIN, TIBC, IRON, RETICCTPCT in the last 72 hours. Coags: No results for input(s): INR in the last 72 hours.  Invalid input(s): PT Microbiology:  Recent Results (from the past 240 hour(s))  MRSA PCR Screening     Status: Abnormal   Collection Time: 03/08/20  8:47 AM  Result Value Ref Range Status   MRSA by PCR POSITIVE (A) NEGATIVE Final    Comment:        The GeneXpert MRSA Assay (FDA approved for NASAL specimens only), is one component of a comprehensive MRSA colonization surveillance program. It is not intended to diagnose MRSA infection nor to guide or monitor treatment for MRSA infections. RESULT CALLED TO, READ BACK BY AND VERIFIED WITH: Timoteo Expose RN 12:40 03/08/20 ([email or web address]) Performed at [facility], [address], [place], [place] [address]     FURTHER DISCHARGE INSTRUCTIONS:  Get Medicines reviewed and adjusted: Please take all your medications with you for your next visit with your Primary MD  Laboratory/radiological data: Please request your Primary MD to go over all hospital tests and procedure/radiological results at the follow up, please ask your Primary MD to get all Hospital records sent to his/her office.  In some cases, they will be blood work, cultures and biopsy results pending at the time of your discharge. Please request that your primary care M.D. goes through all the records of your hospital data and follows up on these results.  Also Note the following: If you experience worsening of your admission symptoms, develop shortness of breath, life threatening emergency, suicidal or homicidal thoughts you must seek medical attention immediately by calling 911 or calling your MD immediately  if symptoms less severe.  You must read complete instructions/literature along with all the possible adverse reactions/side effects for all the Medicines you take and that  have been prescribed to you. Take any new Medicines after you have completely understood and accpet all the possible adverse reactions/side effects.   Do not drive when taking Pain medications or sleeping medications (Benzodaizepines)  Do not take more than prescribed Pain, Sleep and Anxiety Medications. It is not advisable to combine anxiety,sleep and pain medications without talking with your primary care practitioner  Special Instructions: If you have smoked or chewed Tobacco  in the last 2 yrs please stop smoking, stop any regular Alcohol  and or any Recreational drug use.  Wear Seat belts while driving.  Please note: You were cared for by a hospitalist during your hospital stay. Once you are discharged, your primary care physician will handle any further medical issues. Please note that NO REFILLS for any discharge medications will be authorized once you are discharged, as it is imperative that you return to your primary care physician (or establish a relationship with a primary care physician if you do not have one) for your post hospital discharge needs so that they can reassess your need for medications and monitor your lab values.  Total Time spent coordinating discharge including counseling, education and face to face time equals 35 minutes.  SignedJeoffrey Massed 03/15/2020 10:05 AM

## 2020-03-16 ENCOUNTER — Emergency Department (HOSPITAL_COMMUNITY)
Admission: EM | Admit: 2020-03-16 | Discharge: 2020-03-16 | Disposition: A | Discharge status: Other Acute Inpatient Hospital | Payer: Medicare Other | Attending: Emergency Medicine | Admitting: Emergency Medicine

## 2020-03-16 ENCOUNTER — Other Ambulatory Visit: Payer: Self-pay

## 2020-03-16 ENCOUNTER — Emergency Department (HOSPITAL_COMMUNITY): Payer: Medicare Other

## 2020-03-16 DIAGNOSIS — F17210 Nicotine dependence, cigarettes, uncomplicated: Secondary | ICD-10-CM | POA: Diagnosis not present

## 2020-03-16 DIAGNOSIS — S40022A Contusion of left upper arm, initial encounter: Secondary | ICD-10-CM | POA: Insufficient documentation

## 2020-03-16 DIAGNOSIS — E1165 Type 2 diabetes mellitus with hyperglycemia: Secondary | ICD-10-CM | POA: Insufficient documentation

## 2020-03-16 DIAGNOSIS — I2510 Atherosclerotic heart disease of native coronary artery without angina pectoris: Secondary | ICD-10-CM | POA: Insufficient documentation

## 2020-03-16 DIAGNOSIS — Z86718 Personal history of other venous thrombosis and embolism: Secondary | ICD-10-CM | POA: Diagnosis not present

## 2020-03-16 DIAGNOSIS — J441 Chronic obstructive pulmonary disease with (acute) exacerbation: Secondary | ICD-10-CM | POA: Insufficient documentation

## 2020-03-16 DIAGNOSIS — S4991XA Unspecified injury of right shoulder and upper arm, initial encounter: Secondary | ICD-10-CM | POA: Diagnosis present

## 2020-03-16 DIAGNOSIS — E1122 Type 2 diabetes mellitus with diabetic chronic kidney disease: Secondary | ICD-10-CM | POA: Insufficient documentation

## 2020-03-16 DIAGNOSIS — Z96612 Presence of left artificial shoulder joint: Secondary | ICD-10-CM | POA: Diagnosis not present

## 2020-03-16 DIAGNOSIS — I130 Hypertensive heart and chronic kidney disease with heart failure and stage 1 through stage 4 chronic kidney disease, or unspecified chronic kidney disease: Secondary | ICD-10-CM | POA: Diagnosis not present

## 2020-03-16 DIAGNOSIS — I5033 Acute on chronic diastolic (congestive) heart failure: Secondary | ICD-10-CM | POA: Diagnosis not present

## 2020-03-16 DIAGNOSIS — Z86711 Personal history of pulmonary embolism: Secondary | ICD-10-CM | POA: Diagnosis not present

## 2020-03-16 DIAGNOSIS — S8012XA Contusion of left lower leg, initial encounter: Secondary | ICD-10-CM | POA: Insufficient documentation

## 2020-03-16 DIAGNOSIS — Z7901 Long term (current) use of anticoagulants: Secondary | ICD-10-CM | POA: Insufficient documentation

## 2020-03-16 DIAGNOSIS — N183 Chronic kidney disease, stage 3 unspecified: Secondary | ICD-10-CM | POA: Insufficient documentation

## 2020-03-16 DIAGNOSIS — J45909 Unspecified asthma, uncomplicated: Secondary | ICD-10-CM | POA: Insufficient documentation

## 2020-03-16 DIAGNOSIS — Z7984 Long term (current) use of oral hypoglycemic drugs: Secondary | ICD-10-CM | POA: Insufficient documentation

## 2020-03-16 DIAGNOSIS — S8011XA Contusion of right lower leg, initial encounter: Secondary | ICD-10-CM | POA: Diagnosis not present

## 2020-03-16 DIAGNOSIS — R519 Headache, unspecified: Secondary | ICD-10-CM | POA: Diagnosis not present

## 2020-03-16 DIAGNOSIS — W06XXXA Fall from bed, initial encounter: Secondary | ICD-10-CM | POA: Insufficient documentation

## 2020-03-16 DIAGNOSIS — Z7951 Long term (current) use of inhaled steroids: Secondary | ICD-10-CM | POA: Diagnosis not present

## 2020-03-16 DIAGNOSIS — Z79899 Other long term (current) drug therapy: Secondary | ICD-10-CM | POA: Insufficient documentation

## 2020-03-16 DIAGNOSIS — W19XXXA Unspecified fall, initial encounter: Secondary | ICD-10-CM

## 2020-03-16 DIAGNOSIS — Z794 Long term (current) use of insulin: Secondary | ICD-10-CM | POA: Diagnosis not present

## 2020-03-16 DIAGNOSIS — Z96642 Presence of left artificial hip joint: Secondary | ICD-10-CM | POA: Insufficient documentation

## 2020-03-16 DIAGNOSIS — S40021A Contusion of right upper arm, initial encounter: Secondary | ICD-10-CM | POA: Diagnosis not present

## 2020-03-16 LAB — CBC WITH DIFFERENTIAL/PLATELET
Abs Immature Granulocytes: 0.04 10*3/uL (ref 0.00–0.07)
Basophils Absolute: 0 10*3/uL (ref 0.0–0.1)
Basophils Relative: 0 %
Eosinophils Absolute: 0.1 10*3/uL (ref 0.0–0.5)
Eosinophils Relative: 1 %
HCT: 27.9 % — ABNORMAL LOW (ref 39.0–52.0)
Hemoglobin: 8.7 g/dL — ABNORMAL LOW (ref 13.0–17.0)
Immature Granulocytes: 1 %
Lymphocytes Relative: 33 %
Lymphs Abs: 2.4 10*3/uL (ref 0.7–4.0)
MCH: 30.7 pg (ref 26.0–34.0)
MCHC: 31.2 g/dL (ref 30.0–36.0)
MCV: 98.6 fL (ref 80.0–100.0)
Monocytes Absolute: 0.9 10*3/uL (ref 0.1–1.0)
Monocytes Relative: 12 %
Neutro Abs: 3.9 10*3/uL (ref 1.7–7.7)
Neutrophils Relative %: 53 %
Platelets: 184 10*3/uL (ref 150–400)
RBC: 2.83 MIL/uL — ABNORMAL LOW (ref 4.22–5.81)
RDW: 18.5 % — ABNORMAL HIGH (ref 11.5–15.5)
WBC: 7.3 10*3/uL (ref 4.0–10.5)
nRBC: 0 % (ref 0.0–0.2)

## 2020-03-16 LAB — BASIC METABOLIC PANEL
Anion gap: 12 (ref 5–15)
BUN: 17 mg/dL (ref 8–23)
CO2: 20 mmol/L — ABNORMAL LOW (ref 22–32)
Calcium: 8.7 mg/dL — ABNORMAL LOW (ref 8.9–10.3)
Chloride: 105 mmol/L (ref 98–111)
Creatinine, Ser: 1.44 mg/dL — ABNORMAL HIGH (ref 0.61–1.24)
GFR, Estimated: 51 mL/min — ABNORMAL LOW (ref >60)
Glucose, Bld: 114 mg/dL — ABNORMAL HIGH (ref 70–99)
Potassium: 4.2 mmol/L (ref 3.5–5.1)
Sodium: 137 mmol/L (ref 135–145)

## 2020-03-16 NOTE — ED Notes (Signed)
Waiting on [facility] for transportation back to nursing home

## 2020-03-16 NOTE — ED Provider Notes (Signed)
[facility] EMERGENCY DEPARTMENT Provider Note   CSN: [id number] Arrival date & time: 03/16/20  0234    History Chief Complaint  Patient presents with  . Fall    Chad Duke is a 75 y.o. male with past medical history significant for AAA, CHF, frequent falls, CKD, failure to thrive who presents for evaluation after fall.  Apparently was recently discharged for sepsis yesterday and transferred back to his SNF facility.  According to facility patient was thought to have rolled out of bed.  Was found on the ground after hearing a "thump."  He is anticoagulated on Eliquis.  He is at his baseline mentation per facility normally disoriented to time.  On my elevation patient without any complaints.  He is sleeping however arousable to voice.  He moves all 4 extremities.  Unfortunately had prolonged stay at an waiting room due to boarding.    Level 5 caveat- Poor historian  HPI    Past Medical History:  Diagnosis Date  . AAA (abdominal aortic aneurysm) (HCC)   . Allergy    Rhinitis  . Arthritis    Rhuematoid  . Asthma   . CHF (congestive heart failure) (HCC)    chronic diastolic (congestive) heart failure from notes from [facility] face sheet  . Chronic renal insufficiency   . Depression    per notes from [facility] on chart  . Diverticulosis   . Emphysema   . Falls frequently   . GERD (gastroesophageal reflux disease)   . Hearing loss of both ears   . Hiatal hernia   . Hyperlipidemia   . Hypertension   . Male hypogonadism   . Nephrolithiasis   . Nephrolithiasis   . Sleep apnea    On CPAP  . Stasis dermatitis   . Varicose veins   . Vitamin B 12 deficiency     Patient Active Problem List   Diagnosis Date Noted  . Severe sepsis (HCC) 03/02/2020  . Parotitis 03/02/2020  . Chronic renal disease, stage III (HCC) 01/25/2018  . Weakness 10/03/2017  . Adult failure to thrive 10/03/2017  . Positive D dimer 09/26/2017  . Elevated troponin 09/26/2017   . Personal history of DVT (deep vein thrombosis) 09/26/2017  . COPD with acute exacerbation (HCC) 09/26/2017  . CAD (coronary artery disease) 09/08/2017  . Goals of care, counseling/discussion   . Palliative care by specialist   . Acute kidney injury superimposed on chronic kidney disease (HCC) 09/02/2017  . Hypokalemia 09/02/2017  . Hypomagnesemia 09/02/2017  . Controlled type 2 diabetes mellitus with hyperglycemia (HCC) 09/02/2017  . Urinary incontinence 09/02/2017  . Fall 08/13/2017  . Acute renal failure superimposed on stage 3 chronic kidney disease (HCC) 08/13/2017  . HLD (hyperlipidemia) 08/13/2017  . Essential hypertension 08/13/2017  . GERD (gastroesophageal reflux disease) 08/13/2017  . Orthostatic hypotension 08/13/2017  . Deep vein thrombosis (DVT) of both lower extremities (HCC)   . Hyperglycemia   . History of pulmonary embolism 07/20/2017  . Acute on chronic diastolic heart failure (HCC) 07/20/2017  . Bilateral leg edema 07/20/2017  . Acute exacerbation of chronic obstructive pulmonary disease (COPD) (HCC) 07/19/2017  . 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 Cigarette smoker 09/08/2016  . Morbid obesity due to excess calories (HCC) 09/08/2016  . Pain in joint, ankle and foot 03/31/2012  . Abnormality of gait 03/31/2012  . Rheumatoid arthritis (HCC) 03/31/2012  . COPD GOLD III  02/12/2011  . Sleep apnea 02/12/2011    Past Surgical History:  Procedure  Laterality Date  . CARPAL TUNNEL RELEASE     bilateral  . CYSTOSCOPY/URETEROSCOPY/HOLMIUM LASER/STENT PLACEMENT Left 11/02/2018   Procedure: CYSTOSCOPY/URETEROSCOPY/HOLMIUM LASER/STENT PLACEMENT;  Surgeon: Crista Elliot, MD;  Location: [facility];  Service: Urology;  Laterality: Left;  . FRACTURE SURGERY Left    Fibula  . JOINT REPLACEMENT Left    Shoulder  . JOINT REPLACEMENT Left    Hip   . KIDNEY STONE SURGERY  2003  . left hip replacement    . left knee    . left shoulder    . LITHOTRIPSY          Family History  Problem Relation Age of Onset  . Emphysema Father   . Asthma Father   . Osteoarthritis Father   . Heart attack Mother   . Uterine cancer Mother   . Heart disease Mother   . ADD / ADHD Son   . Depression Son   . Hypertension Brother   . Heart disease Brother        Pacemaker   . Heart murmur Brother     Social History   Tobacco Use  . Smoking status: Current Every Day Smoker    Packs/day: 2.00    Years: 53.00    Pack years: 106.00    Types: Cigarettes  . Smokeless tobacco: Never Used  . Tobacco comment: currently smoking 1ppd--01/09/2020  Vaping Use  . Vaping Use: Never used  Substance Use Topics  . Alcohol use: Not Currently    Alcohol/week: 0.0 standard drinks  . Drug use: No    Home Medications Prior to Admission medications   Medication Sig Start Date End Date Taking? Authorizing Provider  albuterol (PROVENTIL HFA;VENTOLIN HFA) 108 (90 Base) MCG/ACT inhaler Inhale 2 puffs into the lungs every 4 (four) hours as needed for wheezing or shortness of breath. 07/30/17   Nyoka Cowden, MD  allopurinol (ZYLOPRIM) 100 MG tablet Take 100 mg by mouth daily.    [provider]  amoxicillin-clavulanate (AUGMENTIN) 875-125 MG tablet Take 1 tablet by mouth 2 (two) times daily for 5 days. 03/15/20 03/20/20  Ghimire, Werner Lean, MD  apixaban (ELIQUIS) 5 MG TABS tablet Take 1 tablet (5 mg total) by mouth 2 (two) times daily. 09/04/17   Albertine Grates, MD  atorvastatin (LIPITOR) 40 MG tablet Take 1 tablet (40 mg total) by mouth daily. Patient taking differently: Take 40 mg by mouth every evening. 05/18/17 10/03/25  Lewayne Bunting, MD  budesonide (PULMICORT) 0.25 MG/2ML nebulizer solution Take 0.25 mg by nebulization 2 (two) times daily.    [provider]  buPROPion (ZYBAN) 150 MG 12 hr tablet Take 1 tablet (150 mg total) by mouth 2 (two) times daily. 01/09/20   Coralyn Helling, MD  CALCIUM-MAGNESIUM-VITAMIN D PO Take 1 tablet by mouth daily.    [provider]  Cholecalciferol (VITAMIN D-3) 125 MCG (5000 UT) TABS Take 5,000 Units by mouth daily. Patient not taking: Reported on 03/03/2020    [provider]  feeding supplement (ENSURE ENLIVE / ENSURE PLUS) LIQD Take 237 mLs by mouth 3 (three) times daily between meals. 03/15/20   Ghimire, Werner Lean, MD  folic acid (FOLVITE) 1 MG tablet Take 2 tablets (2 mg total) by mouth daily. 03/14/20   Ghimire, Werner Lean, MD  insulin aspart (NOVOLOG) 100 UNIT/ML injection 0-15 Units, Subcutaneous, 3 times daily with meals CBG < 70: Implement Hypoglycemia measures CBG 70 - 120: 0 units CBG 121 - 150: 2 units CBG 151 -  200: 3 units CBG 201 - 250: 5 units CBG 251 - 300: 8 units CBG 301 - 350: 11 units CBG 351 - 400: 15 units CBG > 400: call MD 03/14/20   Maretta Bees, MD  ipratropium-albuterol (DUONEB) 0.5-2.5 (3) MG/3ML SOLN Take 3 mLs by nebulization 3 (three) times daily.     [provider]  Magnesium 400 MG CAPS Take 400 mg by mouth at bedtime.     [provider]  metFORMIN (GLUCOPHAGE) 500 MG tablet Take 500 mg by mouth 2 (two) times daily. 05/09/19   [provider]  pantoprazole (PROTONIX) 40 MG tablet Take 40 mg by mouth daily.     [provider]  QUEtiapine (SEROQUEL) 25 MG tablet Take 1 tablet (25 mg total) by mouth at bedtime. 03/14/20   Ghimire, Werner Lean, MD  SYMBICORT 160-4.5 MCG/ACT inhaler Inhale 2 puffs into the lungs in the morning and at bedtime. 01/15/20   [provider]    Allergies    Varenicline and Gabapentin  Review of Systems   Review of Systems  Unable to perform ROS: Other (Poor historian)  Constitutional: Negative.   HENT: Negative.   Respiratory: Negative.   Cardiovascular: Negative.   Gastrointestinal: Negative.   Musculoskeletal: Negative.   Skin: Negative.   Neurological: Negative.     Physical Exam Updated Vital Signs BP 138/78   Pulse 86   Temp 97.8 F (36.6 C) (Axillary)   Resp 17   SpO2 94%    Physical Exam Vitals and nursing note reviewed.  Constitutional:      General: He is not in acute distress.    Appearance: He is well-developed and well-nourished. He is ill-appearing (Chronically ill appearing). He is not diaphoretic.     Comments: Sleeping, arousable to voice  HENT:     Head: Atraumatic.     Nose: Nose normal.     Mouth/Throat:     Mouth: Mucous membranes are dry.  Eyes:     Pupils: Pupils are equal, round, and reactive to light.  Cardiovascular:     Rate and Rhythm: Normal rate and regular rhythm.     Pulses: Normal pulses.     Heart sounds: Normal heart sounds.  Pulmonary:     Effort: Pulmonary effort is normal. No respiratory distress.     Breath sounds: Normal breath sounds.     Comments: Clear to auscultation bilaterally, speaks in full sentences without difficulty Abdominal:     General: Bowel sounds are normal. There is no distension.     Palpations: Abdomen is soft.     Comments: Soft, nontender without rebound or guarding  Musculoskeletal:        General: Normal range of motion.     Cervical back: Normal range of motion and neck supple.     Comments: Moves all 4 extremities without difficulty.  No bony tenderness  Skin:    General: Skin is warm and dry.     Comments: Very stages of ecchymosis on bilateral upper and lower extremities.  Multiple various sized skin tears however appear to be old, well-healing.  No erythema, warmth, evidence of abscess or cellulitis.  Neurological:     Comments: Alert and oriented to person, date of birth, year  Psychiatric:        Mood and Affect: Mood and affect normal.     Comments: Appear pleasant    ED Results / Procedures / Treatments   Labs (all labs ordered are listed, but only  abnormal results are displayed) Labs Reviewed  CBC WITH DIFFERENTIAL/PLATELET - Abnormal; Notable for the following components:      Result Value   RBC 2.83 (*)    Hemoglobin 8.7 (*)    HCT 27.9 (*)    RDW 18.5 (*)    All  other components within normal limits  BASIC METABOLIC PANEL - Abnormal; Notable for the following components:   CO2 20 (*)    Glucose, Bld 114 (*)    Creatinine, Ser 1.44 (*)    Calcium 8.7 (*)    GFR, Estimated 51 (*)    All other components within normal limits    EKG EKG Interpretation  Date/Time:  Saturday March 16 2020 09:29:04 EST Ventricular Rate:  92 PR Interval:    QRS Duration: 147 QT Interval:  400 QTC Calculation: 495 R Axis:   -64 Text Interpretation: Sinus rhythm Right bundle branch block similar to Mar 14 2020 Confirmed by Pricilla Loveless [phone]) on 03/16/2020 10:19:12 AM   Radiology DG Chest 2 View  Result Date: 03/16/2020 CLINICAL DATA:  Fall. EXAM: CHEST - 2 VIEW COMPARISON:  Two days ago FINDINGS: Interstitial coarsening which is chronic. Aeration is improved from recent study. Trace pleural fluid or scarring at the lateral left costophrenic sulcus. Normal heart size. Aortic tortuosity. Left glenohumeral arthroplasty. No visible acute osseous finding. IMPRESSION: No acute finding when compared to prior. Electronically Signed   By: Marnee Spring M.D.   On: 03/16/2020 10:00   DG Pelvis 1-2 Views  Result Date: 03/16/2020 CLINICAL DATA:  Is fall, pelvic pain EXAM: PELVIS - 1-2 VIEW COMPARISON:  CT of the abdomen and pelvis from February 28, 2018 FINDINGS: Degenerative changes about the RIGHT hip. Postop changes of LEFT total hip arthroplasty. No sign of fracture or dislocation on AP view. IMPRESSION: Postop changes of LEFT total hip arthroplasty without acute finding on AP view. Electronically Signed   By: Donzetta Kohut M.D.   On: 03/16/2020 10:02   CT Head Wo Contrast  Result Date: 03/16/2020 CLINICAL DATA:  Trauma to the head and face. EXAM: CT HEAD WITHOUT CONTRAST TECHNIQUE: Contiguous axial images were obtained from the base of the skull through the vertex without intravenous contrast. COMPARISON:  03/02/2020 FINDINGS: Brain: Generalized atrophy. Chronic  small-vessel ischemic changes of the white matter. No sign of acute infarction, mass lesion, hemorrhage, hydrocephalus or extra-axial collection. Vascular: There is atherosclerotic calcification of the major vessels at the base of the brain. Skull: No skull fracture. Left temporomandibular joint is re- located. Sinuses/Orbits: Clear/normal Other: None IMPRESSION: No acute or traumatic finding. Atrophy and chronic small-vessel ischemic changes of the white matter. Electronically Signed   By: Paulina Fusi M.D.   On: 03/16/2020 03:23   DG CHEST PORT 1 VIEW  Result Date: 03/14/2020 CLINICAL DATA:  Respiratory distress EXAM: PORTABLE CHEST 1 VIEW COMPARISON:  03/02/2020 FINDINGS: Mild cardiomegaly. Right-greater-than-left bibasilar opacities, likely atelectasis or early consolidation. No pneumothorax or sizable pleural effusion. IMPRESSION: Right-greater-than-left bibasilar opacities, likely atelectasis or early consolidation. Electronically Signed   By: Deatra Robinson M.D.   On: 03/14/2020 21:08   Procedures Procedures   Medications Ordered in ED Medications - No data to display  ED Course  I have reviewed the triage vital signs and the nursing notes.  Pertinent labs & imaging results that were available during my care of the patient were reviewed by me and considered in my medical decision making (see chart for details).  75 year old, recently discharged for sepsis 2/2 peritonitis,  discharged yesterday found after suspected roll out of bed at patient SNF facility.  He is at his baseline mentation per facility.  Does have moderate bilateral upper and lower ecchymosis and skin tears which appear to be in very stages of healing.  He has no evidence of cellulitis.  No bony tenderness.  Nonfocal neuro exam without deficits.  He is anticoagulated on Eliquis.   CBC without leukocytosis, hemoglobin 8.7 at baseline Metabolic panel at baseline Head CT without significant findings Chest x-ray without  significant findings Pelvis imaging without any significant findings EKG without ischemic changes  Patient is at his baseline mentation.  DC back to nursing facility. Ambulatory at baseline.  The patient has been appropriately medically screened and/or stabilized in the ED. I have low suspicion for any other emergent medical condition which would require further screening, evaluation or treatment in the ED or require inpatient management.  Patient is hemodynamically stable and in no acute distress.  Patient able to ambulate in department prior to ED.  Evaluation does not show acute pathology that would require ongoing or additional emergent interventions while in the emergency department or further inpatient treatment.  I have discussed the diagnosis with the patient and answered all questions.  Pain is been managed while in the emergency department and patient has no further complaints prior to discharge.  Patient is comfortable with plan discussed in room and is stable for discharge at this time.  I have discussed strict return precautions for returning to the emergency department.  Patient was encouraged to follow-up with PCP/specialist refer to at discharge.    MDM Rules/Calculators/A&P                           Final Clinical Impression(s) / ED Diagnoses Final diagnoses:  Fall, initial encounter    Rx / DC Orders ED Discharge Orders    None       Manisha Cancel A, PA-C 03/16/20 1244    Rolan Bucco, MD 03/16/20 1259

## 2020-03-16 NOTE — ED Notes (Signed)
Patient transported to X-ray 

## 2020-03-16 NOTE — ED Notes (Addendum)
I notified Britni, PA of pts Hgb and Hct

## 2020-03-16 NOTE — ED Notes (Signed)
Laurel Dimmer, Med assistant in to sit with patient  Aware to call me for any problems

## 2020-03-16 NOTE — ED Triage Notes (Addendum)
Pt presents to ED BIB [facility] from [facility]. Per facility pt rolled out of bead. Per facility pt found face down on floor. Bruising all over body d/t frequent falls. Pt is on eliquis, AAO x3, disoriented to time, EMS [facility]SS

## 2020-03-16 NOTE — ED Notes (Signed)
Pt having a hard time staying in [facility]cl[facility]. Pt also having a hard time staying awake.

## 2020-03-17 ENCOUNTER — Emergency Department (HOSPITAL_COMMUNITY): Payer: Medicare Other

## 2020-03-17 ENCOUNTER — Other Ambulatory Visit: Payer: Self-pay

## 2020-03-17 ENCOUNTER — Emergency Department (HOSPITAL_COMMUNITY)
Admission: EM | Admit: 2020-03-17 | Discharge: 2020-03-17 | Disposition: A | Discharge status: Home / Self Care | Payer: Medicare Other | Attending: Emergency Medicine | Admitting: Emergency Medicine

## 2020-03-17 ENCOUNTER — Encounter (HOSPITAL_COMMUNITY): Payer: Self-pay

## 2020-03-17 DIAGNOSIS — Z794 Long term (current) use of insulin: Secondary | ICD-10-CM | POA: Diagnosis not present

## 2020-03-17 DIAGNOSIS — Z96642 Presence of left artificial hip joint: Secondary | ICD-10-CM | POA: Diagnosis not present

## 2020-03-17 DIAGNOSIS — S0003XA Contusion of scalp, initial encounter: Secondary | ICD-10-CM | POA: Diagnosis not present

## 2020-03-17 DIAGNOSIS — S51012A Laceration without foreign body of left elbow, initial encounter: Secondary | ICD-10-CM | POA: Diagnosis not present

## 2020-03-17 DIAGNOSIS — Z7984 Long term (current) use of oral hypoglycemic drugs: Secondary | ICD-10-CM | POA: Insufficient documentation

## 2020-03-17 DIAGNOSIS — W06XXXA Fall from bed, initial encounter: Secondary | ICD-10-CM | POA: Insufficient documentation

## 2020-03-17 DIAGNOSIS — Z7951 Long term (current) use of inhaled steroids: Secondary | ICD-10-CM | POA: Diagnosis not present

## 2020-03-17 DIAGNOSIS — I130 Hypertensive heart and chronic kidney disease with heart failure and stage 1 through stage 4 chronic kidney disease, or unspecified chronic kidney disease: Secondary | ICD-10-CM | POA: Diagnosis not present

## 2020-03-17 DIAGNOSIS — J441 Chronic obstructive pulmonary disease with (acute) exacerbation: Secondary | ICD-10-CM | POA: Diagnosis not present

## 2020-03-17 DIAGNOSIS — E1122 Type 2 diabetes mellitus with diabetic chronic kidney disease: Secondary | ICD-10-CM | POA: Insufficient documentation

## 2020-03-17 DIAGNOSIS — N183 Chronic kidney disease, stage 3 unspecified: Secondary | ICD-10-CM | POA: Insufficient documentation

## 2020-03-17 DIAGNOSIS — S51811A Laceration without foreign body of right forearm, initial encounter: Secondary | ICD-10-CM | POA: Insufficient documentation

## 2020-03-17 DIAGNOSIS — F17210 Nicotine dependence, cigarettes, uncomplicated: Secondary | ICD-10-CM | POA: Diagnosis not present

## 2020-03-17 DIAGNOSIS — J45909 Unspecified asthma, uncomplicated: Secondary | ICD-10-CM | POA: Diagnosis not present

## 2020-03-17 DIAGNOSIS — S59902A Unspecified injury of left elbow, initial encounter: Secondary | ICD-10-CM | POA: Diagnosis present

## 2020-03-17 DIAGNOSIS — F0390 Unspecified dementia without behavioral disturbance: Secondary | ICD-10-CM | POA: Diagnosis not present

## 2020-03-17 DIAGNOSIS — I5032 Chronic diastolic (congestive) heart failure: Secondary | ICD-10-CM | POA: Diagnosis not present

## 2020-03-17 DIAGNOSIS — E1165 Type 2 diabetes mellitus with hyperglycemia: Secondary | ICD-10-CM | POA: Insufficient documentation

## 2020-03-17 DIAGNOSIS — Z7901 Long term (current) use of anticoagulants: Secondary | ICD-10-CM | POA: Diagnosis not present

## 2020-03-17 DIAGNOSIS — Z96612 Presence of left artificial shoulder joint: Secondary | ICD-10-CM | POA: Insufficient documentation

## 2020-03-17 DIAGNOSIS — I2510 Atherosclerotic heart disease of native coronary artery without angina pectoris: Secondary | ICD-10-CM | POA: Diagnosis not present

## 2020-03-17 NOTE — ED Notes (Signed)
Called [facility] to take to [facility]

## 2020-03-17 NOTE — ED Triage Notes (Signed)
Patient arrived by [facility] after falling out of bed. Skin tears to arms with dressings to same and bleeding controlled. Per staff no loc, alert to baseline, hx of dementia

## 2020-03-17 NOTE — ED Notes (Signed)
Patient transported to X-ray 

## 2020-03-17 NOTE — Discharge Instructions (Signed)
Your x-rays today did not show any fracture  Your CT head did not show any bleeding  You have fell several days in a row and will need to be on fall precautions.  Continue current meds.  See your doctor for follow-up  Return to ER if you have another fall, uncontrolled bleeding.

## 2020-03-17 NOTE — ED Provider Notes (Signed)
[facility] EMERGENCY DEPARTMENT Provider Note   CSN: [id number] Arrival date & time: 03/17/20  1837     History No chief complaint on file.   Chad Duke is a 75 y.o. male hx of AAA, CHF, hypertension, high cholesterol here presenting with fall. Patient is from a nursing home. He apparently had a fall today. He fell out of his bed and was noted to have abrasions bilateral arms. Patient actually had a fall yesterday and was evaluated the ED and CBC that was stable and CT head and x-rays that were unremarkable. Before that, patient was admitted to the hospital for sepsis. Patient demented and trying to climb out of bed. He states that he just slipped out of bed. Patient's tetanus is up-to-date.  The history is provided by the patient.       Past Medical History:  Diagnosis Date  . AAA (abdominal aortic aneurysm) (HCC)   . Allergy    Rhinitis  . Arthritis    Rhuematoid  . Asthma   . CHF (congestive heart failure) (HCC)    chronic diastolic (congestive) heart failure from notes from [facility] face sheet  . Chronic renal insufficiency   . Depression    per notes from [facility] on chart  . Diverticulosis   . Emphysema   . Falls frequently   . GERD (gastroesophageal reflux disease)   . Hearing loss of both ears   . Hiatal hernia   . Hyperlipidemia   . Hypertension   . Male hypogonadism   . Nephrolithiasis   . Nephrolithiasis   . Sleep apnea    On CPAP  . Stasis dermatitis   . Varicose veins   . Vitamin B 12 deficiency     Patient Active Problem List   Diagnosis Date Noted  . Severe sepsis (HCC) 03/02/2020  . Parotitis 03/02/2020  . Chronic renal disease, stage III (HCC) 01/25/2018  . Weakness 10/03/2017  . Adult failure to thrive 10/03/2017  . Positive D dimer 09/26/2017  . Elevated troponin 09/26/2017  . Personal history of DVT (deep vein thrombosis) 09/26/2017  . COPD with acute exacerbation (HCC) 09/26/2017  . CAD (coronary artery  disease) 09/08/2017  . Goals of care, counseling/discussion   . Palliative care by specialist   . Acute kidney injury superimposed on chronic kidney disease (HCC) 09/02/2017  . Hypokalemia 09/02/2017  . Hypomagnesemia 09/02/2017  . Controlled type 2 diabetes mellitus with hyperglycemia (HCC) 09/02/2017  . Urinary incontinence 09/02/2017  . Fall 08/13/2017  . Acute renal failure superimposed on stage 3 chronic kidney disease (HCC) 08/13/2017  . HLD (hyperlipidemia) 08/13/2017  . Essential hypertension 08/13/2017  . GERD (gastroesophageal reflux disease) 08/13/2017  . Orthostatic hypotension 08/13/2017  . Deep vein thrombosis (DVT) of both lower extremities (HCC)   . Hyperglycemia   . History of pulmonary embolism 07/20/2017  . Acute on chronic diastolic heart failure (HCC) 07/20/2017  . Bilateral leg edema 07/20/2017  . Acute exacerbation of chronic obstructive pulmonary disease (COPD) (HCC) 07/19/2017  . 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 Cigarette smoker 09/08/2016  . Morbid obesity due to excess calories (HCC) 09/08/2016  . Pain in joint, ankle and foot 03/31/2012  . Abnormality of gait 03/31/2012  . Rheumatoid arthritis (HCC) 03/31/2012  . COPD GOLD III  02/12/2011  . Sleep apnea 02/12/2011    Past Surgical History:  Procedure Laterality Date  . CARPAL TUNNEL RELEASE     bilateral  . CYSTOSCOPY/URETEROSCOPY/HOLMIUM LASER/STENT PLACEMENT Left 11/02/2018  Procedure: CYSTOSCOPY/URETEROSCOPY/HOLMIUM LASER/STENT PLACEMENT;  Surgeon: Crista Elliot, MD;  Location: [facility];  Service: Urology;  Laterality: Left;  . FRACTURE SURGERY Left    Fibula  . JOINT REPLACEMENT Left    Shoulder  . JOINT REPLACEMENT Left    Hip   . KIDNEY STONE SURGERY  2003  . left hip replacement    . left knee    . left shoulder    . LITHOTRIPSY         Family History  Problem Relation Age of Onset  . Emphysema Father   . Asthma Father   . Osteoarthritis Father   . Heart attack  Mother   . Uterine cancer Mother   . Heart disease Mother   . ADD / ADHD Son   . Depression Son   . Hypertension Brother   . Heart disease Brother        Pacemaker   . Heart murmur Brother     Social History   Tobacco Use  . Smoking status: Current Every Day Smoker    Packs/day: 2.00    Years: 53.00    Pack years: 106.00    Types: Cigarettes  . Smokeless tobacco: Never Used  . Tobacco comment: currently smoking 1ppd--01/09/2020  Vaping Use  . Vaping Use: Never used  Substance Use Topics  . Alcohol use: Not Currently    Alcohol/week: 0.0 standard drinks  . Drug use: No    Home Medications Prior to Admission medications   Medication Sig Start Date End Date Taking? Authorizing Provider  albuterol (PROVENTIL HFA;VENTOLIN HFA) 108 (90 Base) MCG/ACT inhaler Inhale 2 puffs into the lungs every 4 (four) hours as needed for wheezing or shortness of breath. 07/30/17   Nyoka Cowden, MD  allopurinol (ZYLOPRIM) 100 MG tablet Take 100 mg by mouth daily.    [provider]  amoxicillin-clavulanate (AUGMENTIN) 875-125 MG tablet Take 1 tablet by mouth 2 (two) times daily for 5 days. 03/15/20 03/20/20  Ghimire, Werner Lean, MD  apixaban (ELIQUIS) 5 MG TABS tablet Take 1 tablet (5 mg total) by mouth 2 (two) times daily. 09/04/17   Albertine Grates, MD  atorvastatin (LIPITOR) 40 MG tablet Take 1 tablet (40 mg total) by mouth daily. Patient taking differently: Take 40 mg by mouth every evening. 05/18/17 10/03/25  Lewayne Bunting, MD  budesonide (PULMICORT) 0.25 MG/2ML nebulizer solution Take 0.25 mg by nebulization 2 (two) times daily.    [provider]  buPROPion (ZYBAN) 150 MG 12 hr tablet Take 1 tablet (150 mg total) by mouth 2 (two) times daily. 01/09/20   Coralyn Helling, MD  CALCIUM-MAGNESIUM-VITAMIN D PO Take 1 tablet by mouth daily.    [provider]  Cholecalciferol (VITAMIN D-3) 125 MCG (5000 UT) TABS Take 5,000 Units by mouth daily. Patient not taking: Reported on  03/03/2020    [provider]  feeding supplement (ENSURE ENLIVE / ENSURE PLUS) LIQD Take 237 mLs by mouth 3 (three) times daily between meals. 03/15/20   Ghimire, Werner Lean, MD  folic acid (FOLVITE) 1 MG tablet Take 2 tablets (2 mg total) by mouth daily. 03/14/20   Ghimire, Werner Lean, MD  insulin aspart (NOVOLOG) 100 UNIT/ML injection 0-15 Units, Subcutaneous, 3 times daily with meals CBG < 70: Implement Hypoglycemia measures CBG 70 - 120: 0 units CBG 121 - 150: 2 units CBG 151 - 200: 3 units CBG 201 - 250: 5 units CBG 251 - 300: 8 units CBG 301 - 350: 11  units CBG 351 - 400: 15 units CBG > 400: call MD 03/14/20   Maretta Bees, MD  ipratropium-albuterol (DUONEB) 0.5-2.5 (3) MG/3ML SOLN Take 3 mLs by nebulization 3 (three) times daily.     [provider]  Magnesium 400 MG CAPS Take 400 mg by mouth at bedtime.     [provider]  metFORMIN (GLUCOPHAGE) 500 MG tablet Take 500 mg by mouth 2 (two) times daily. 05/09/19   [provider]  pantoprazole (PROTONIX) 40 MG tablet Take 40 mg by mouth daily.     [provider]  QUEtiapine (SEROQUEL) 25 MG tablet Take 1 tablet (25 mg total) by mouth at bedtime. 03/14/20   Ghimire, Werner Lean, MD  SYMBICORT 160-4.5 MCG/ACT inhaler Inhale 2 puffs into the lungs in the morning and at bedtime. 01/15/20   [provider]    Allergies    Varenicline and Gabapentin  Review of Systems   Review of Systems  Skin: Positive for wound.  All other systems reviewed and are negative.   Physical Exam Updated Vital Signs BP (!) 152/86 (BP Location: Right Arm)   Pulse 96   Resp (!) 22   SpO2 100%   Physical Exam Vitals and nursing note reviewed.  Constitutional:      Comments: Demented,  HENT:     Head:     Comments: Possible posterior scalp hematoma but no obvious laceration    Mouth/Throat:     Mouth: Mucous membranes are moist.  Eyes:     Extraocular Movements: Extraocular movements intact.      Pupils: Pupils are equal, round, and reactive to light.  Cardiovascular:     Rate and Rhythm: Normal rate and regular rhythm.     Pulses: Normal pulses.     Heart sounds: Normal heart sounds.  Pulmonary:     Effort: Pulmonary effort is normal.     Breath sounds: Normal breath sounds.  Abdominal:     General: Abdomen is flat.     Palpations: Abdomen is soft.  Musculoskeletal:     Cervical back: Normal range of motion and neck supple.     Comments: Patient has no midline tenderness. Normal range of motion bilateral hips. Patient does have skin tear in the left elbow area with no obvious bony tenderness. Also skin tear in the right forearm. Patient has multiple bruises on extremities from previous falls.  Skin:    General: Skin is warm.     Capillary Refill: Capillary refill takes less than 2 seconds.     Comments: See MSK section  Neurological:     Comments: Demented, trying to climb out of bed and moving all extremities  Psychiatric:        Mood and Affect: Mood normal.     ED Results / Procedures / Treatments   Labs (all labs ordered are listed, but only abnormal results are displayed) Labs Reviewed - No data to display  EKG None  Radiology DG Chest 1 View  Result Date: 03/17/2020 CLINICAL DATA:  Pain status post fall EXAM: CHEST  1 VIEW COMPARISON:  03/16/2020 FINDINGS: The patient has undergone prior total shoulder arthroplasty on the left. The heart size is stable. There is no pneumothorax or large pleural effusion. There are old healed right-sided rib fractures. No definite acute displaced fracture identified on today's study. There is stable blunting of both costophrenic angles favored to be secondary to trace bilateral pleural effusions and atelectasis. IMPRESSION: 1. No acute cardiopulmonary process. 2.  Stable blunting of both costophrenic angles favored to be secondary to trace bilateral pleural effusions and atelectasis. Electronically Signed   By: Katherine Mantle M.D.    On: 03/17/2020 21:07   DG Chest 2 View  Result Date: 03/16/2020 CLINICAL DATA:  Fall. EXAM: CHEST - 2 VIEW COMPARISON:  Two days ago FINDINGS: Interstitial coarsening which is chronic. Aeration is improved from recent study. Trace pleural fluid or scarring at the lateral left costophrenic sulcus. Normal heart size. Aortic tortuosity. Left glenohumeral arthroplasty. No visible acute osseous finding. IMPRESSION: No acute finding when compared to prior. Electronically Signed   By: Marnee Spring M.D.   On: 03/16/2020 10:00   DG Pelvis 1-2 Views  Result Date: 03/17/2020 CLINICAL DATA:  Pain status post fall EXAM: PELVIS - 1-2 VIEW COMPARISON:  03/16/2020 FINDINGS: The patient has undergone total hip arthroplasty on the left. The hardware appears grossly intact where visualized. There are degenerative changes of the right hip. No definite acute displaced fracture or dislocation. Vascular calcifications are noted. IMPRESSION: 1. No definite acute displaced fracture or dislocation. 2. Status post total hip arthroplasty on the left. Electronically Signed   By: Katherine Mantle M.D.   On: 03/17/2020 21:06   DG Pelvis 1-2 Views  Result Date: 03/16/2020 CLINICAL DATA:  Is fall, pelvic pain EXAM: PELVIS - 1-2 VIEW COMPARISON:  CT of the abdomen and pelvis from February 28, 2018 FINDINGS: Degenerative changes about the RIGHT hip. Postop changes of LEFT total hip arthroplasty. No sign of fracture or dislocation on AP view. IMPRESSION: Postop changes of LEFT total hip arthroplasty without acute finding on AP view. Electronically Signed   By: Donzetta Kohut M.D.   On: 03/16/2020 10:02   DG Elbow Complete Left  Result Date: 03/17/2020 CLINICAL DATA:  Pain status post fall EXAM: LEFT ELBOW - COMPLETE 3+ VIEW COMPARISON:  None. FINDINGS: Evaluation is limited by suboptimal lateral view. Given this limitation, no acute osseous abnormality was detected. There is soft tissue swelling about the posterior aspect the elbow.  IMPRESSION: 1. No acute osseous abnormality detected on this limited study. 2. Soft tissue swelling about the posterior aspect of the elbow. Electronically Signed   By: Katherine Mantle M.D.   On: 03/17/2020 21:09   DG Forearm Right  Result Date: 03/17/2020 CLINICAL DATA:  Pain status post fall EXAM: RIGHT FOREARM - 2 VIEW COMPARISON:  None. FINDINGS: There are advanced degenerative changes of the patient's wrist. There is no acute displaced fracture or dislocation. No significant surrounding soft tissue swelling. IMPRESSION: 1. No acute displaced fracture or dislocation. 2. Advanced degenerative changes of the patient's wrist. Electronically Signed   By: Katherine Mantle M.D.   On: 03/17/2020 21:08   CT Head Wo Contrast  Result Date: 03/17/2020 CLINICAL DATA:  Altered mental status, fall earlier today. EXAM: CT HEAD WITHOUT CONTRAST TECHNIQUE: Contiguous axial images were obtained from the base of the skull through the vertex without intravenous contrast. COMPARISON:  Multiple priors including head CT March 16, 2020 FINDINGS: Brain: Unchanged global parenchymal volume loss with ex vacuo dilatation of ventricular system. Similar chronic small vessel ischemic white matter changes. The no evidence of acute infarction, hemorrhage, hydrocephalus, extra-axial collection or mass lesion/mass effect. Vascular: No hyperdense vessel. Atherosclerotic vascular calcifications. Skull: Normal. Negative for fracture or focal lesion. Sinuses/Orbits: No acute finding. Other: None. IMPRESSION: 1. No acute intracranial findings. 2. Unchanged global parenchymal volume loss and chronic small vessel ischemic white matter changes. Electronically Signed   By: Tinnie Gens  Caprice Beaver MD   On: 03/17/2020 21:03   CT Head Wo Contrast  Result Date: 03/16/2020 CLINICAL DATA:  Trauma to the head and face. EXAM: CT HEAD WITHOUT CONTRAST TECHNIQUE: Contiguous axial images were obtained from the base of the skull through the vertex without  intravenous contrast. COMPARISON:  03/02/2020 FINDINGS: Brain: Generalized atrophy. Chronic small-vessel ischemic changes of the white matter. No sign of acute infarction, mass lesion, hemorrhage, hydrocephalus or extra-axial collection. Vascular: There is atherosclerotic calcification of the major vessels at the base of the brain. Skull: No skull fracture. Left temporomandibular joint is re- located. Sinuses/Orbits: Clear/normal Other: None IMPRESSION: No acute or traumatic finding. Atrophy and chronic small-vessel ischemic changes of the white matter. Electronically Signed   By: Paulina Fusi M.D.   On: 03/16/2020 03:23    Procedures Procedures   Medications Ordered in ED Medications - No data to display  ED Course  I have reviewed the triage vital signs and the nursing notes.  Pertinent labs & imaging results that were available during my care of the patient were reviewed by me and considered in my medical decision making (see chart for details).    MDM Rules/Calculators/A&P                         Hermann Dottavio is a 75 y.o. male here with a fall. Patient has recurrent mechanical falls and fell out of bed again today. He has left elbow skin tear and right forearm skin tear. I was able to redress those wounds. They do not appear infected right now. He has CBC checked yesterday already. We will get some x-rays and CT head given possible head injury.  9:12 PM X-rays did not show any fracture.  Wound was redressed and patient is stable to return to facility.  He needs follow-up precautions as he fell 2 days in a row.  Final Clinical Impression(s) / ED Diagnoses Final diagnoses:  None    Rx / DC Orders ED Discharge Orders    None       Charlynne Pander, MD 03/17/20 2113

## 2020-03-21 ENCOUNTER — Telehealth: Payer: Self-pay | Admitting: *Deleted

## 2020-03-21 NOTE — Telephone Encounter (Signed)
I called to schedule Chad Duke his follow up visit,his niece stated he's in [facility] and will call us if he needs to be seen. Recall removed.

## 2020-03-26 ENCOUNTER — Emergency Department (HOSPITAL_COMMUNITY): Payer: Medicare Other

## 2020-03-26 ENCOUNTER — Inpatient Hospital Stay (HOSPITAL_COMMUNITY)
Admission: EM | Admit: 2020-03-26 | Discharge: 2020-03-30 | DRG: 871 | Disposition: A | Discharge status: Skilled Nursing Facility | Payer: Medicare Other | Source: Skilled Nursing Facility | Attending: Internal Medicine | Admitting: Internal Medicine

## 2020-03-26 DIAGNOSIS — Z825 Family history of asthma and other chronic lower respiratory diseases: Secondary | ICD-10-CM

## 2020-03-26 DIAGNOSIS — E538 Deficiency of other specified B group vitamins: Secondary | ICD-10-CM | POA: Diagnosis present

## 2020-03-26 DIAGNOSIS — G9341 Metabolic encephalopathy: Secondary | ICD-10-CM | POA: Diagnosis present

## 2020-03-26 DIAGNOSIS — R569 Unspecified convulsions: Secondary | ICD-10-CM | POA: Diagnosis present

## 2020-03-26 DIAGNOSIS — I5032 Chronic diastolic (congestive) heart failure: Secondary | ICD-10-CM | POA: Diagnosis present

## 2020-03-26 DIAGNOSIS — Z7951 Long term (current) use of inhaled steroids: Secondary | ICD-10-CM

## 2020-03-26 DIAGNOSIS — Z9981 Dependence on supplemental oxygen: Secondary | ICD-10-CM

## 2020-03-26 DIAGNOSIS — N1831 Chronic kidney disease, stage 3a: Secondary | ICD-10-CM | POA: Diagnosis present

## 2020-03-26 DIAGNOSIS — R627 Adult failure to thrive: Secondary | ICD-10-CM | POA: Diagnosis present

## 2020-03-26 DIAGNOSIS — A419 Sepsis, unspecified organism: Secondary | ICD-10-CM | POA: Diagnosis not present

## 2020-03-26 DIAGNOSIS — U071 COVID-19: Secondary | ICD-10-CM | POA: Diagnosis present

## 2020-03-26 DIAGNOSIS — I6782 Cerebral ischemia: Secondary | ICD-10-CM | POA: Diagnosis present

## 2020-03-26 DIAGNOSIS — Z888 Allergy status to other drugs, medicaments and biological substances status: Secondary | ICD-10-CM

## 2020-03-26 DIAGNOSIS — D849 Immunodeficiency, unspecified: Secondary | ICD-10-CM | POA: Diagnosis present

## 2020-03-26 DIAGNOSIS — M069 Rheumatoid arthritis, unspecified: Secondary | ICD-10-CM | POA: Diagnosis present

## 2020-03-26 DIAGNOSIS — Z96642 Presence of left artificial hip joint: Secondary | ICD-10-CM | POA: Diagnosis present

## 2020-03-26 DIAGNOSIS — Z818 Family history of other mental and behavioral disorders: Secondary | ICD-10-CM

## 2020-03-26 DIAGNOSIS — G319 Degenerative disease of nervous system, unspecified: Secondary | ICD-10-CM | POA: Diagnosis present

## 2020-03-26 DIAGNOSIS — T68XXXA Hypothermia, initial encounter: Secondary | ICD-10-CM | POA: Diagnosis present

## 2020-03-26 DIAGNOSIS — I10 Essential (primary) hypertension: Secondary | ICD-10-CM | POA: Diagnosis present

## 2020-03-26 DIAGNOSIS — I4510 Unspecified right bundle-branch block: Secondary | ICD-10-CM | POA: Diagnosis present

## 2020-03-26 DIAGNOSIS — D638 Anemia in other chronic diseases classified elsewhere: Secondary | ICD-10-CM | POA: Diagnosis present

## 2020-03-26 DIAGNOSIS — N390 Urinary tract infection, site not specified: Secondary | ICD-10-CM | POA: Diagnosis present

## 2020-03-26 DIAGNOSIS — E785 Hyperlipidemia, unspecified: Secondary | ICD-10-CM | POA: Diagnosis present

## 2020-03-26 DIAGNOSIS — I714 Abdominal aortic aneurysm, without rupture: Secondary | ICD-10-CM | POA: Diagnosis present

## 2020-03-26 DIAGNOSIS — I130 Hypertensive heart and chronic kidney disease with heart failure and stage 1 through stage 4 chronic kidney disease, or unspecified chronic kidney disease: Secondary | ICD-10-CM | POA: Diagnosis present

## 2020-03-26 DIAGNOSIS — I2510 Atherosclerotic heart disease of native coronary artery without angina pectoris: Secondary | ICD-10-CM | POA: Diagnosis present

## 2020-03-26 DIAGNOSIS — F17210 Nicotine dependence, cigarettes, uncomplicated: Secondary | ICD-10-CM | POA: Diagnosis present

## 2020-03-26 DIAGNOSIS — K219 Gastro-esophageal reflux disease without esophagitis: Secondary | ICD-10-CM | POA: Diagnosis present

## 2020-03-26 DIAGNOSIS — N179 Acute kidney failure, unspecified: Secondary | ICD-10-CM | POA: Diagnosis present

## 2020-03-26 DIAGNOSIS — R680 Hypothermia, not associated with low environmental temperature: Secondary | ICD-10-CM | POA: Diagnosis present

## 2020-03-26 DIAGNOSIS — Z86711 Personal history of pulmonary embolism: Secondary | ICD-10-CM | POA: Diagnosis present

## 2020-03-26 DIAGNOSIS — Z79899 Other long term (current) drug therapy: Secondary | ICD-10-CM

## 2020-03-26 DIAGNOSIS — Z960 Presence of urogenital implants: Secondary | ICD-10-CM | POA: Diagnosis present

## 2020-03-26 DIAGNOSIS — E1122 Type 2 diabetes mellitus with diabetic chronic kidney disease: Secondary | ICD-10-CM | POA: Diagnosis present

## 2020-03-26 DIAGNOSIS — J449 Chronic obstructive pulmonary disease, unspecified: Secondary | ICD-10-CM | POA: Diagnosis present

## 2020-03-26 DIAGNOSIS — J439 Emphysema, unspecified: Secondary | ICD-10-CM | POA: Diagnosis present

## 2020-03-26 DIAGNOSIS — E876 Hypokalemia: Secondary | ICD-10-CM | POA: Diagnosis present

## 2020-03-26 DIAGNOSIS — E860 Dehydration: Secondary | ICD-10-CM | POA: Diagnosis present

## 2020-03-26 DIAGNOSIS — Z515 Encounter for palliative care: Secondary | ICD-10-CM

## 2020-03-26 DIAGNOSIS — Z7901 Long term (current) use of anticoagulants: Secondary | ICD-10-CM

## 2020-03-26 DIAGNOSIS — E11649 Type 2 diabetes mellitus with hypoglycemia without coma: Secondary | ICD-10-CM | POA: Diagnosis present

## 2020-03-26 DIAGNOSIS — Z8249 Family history of ischemic heart disease and other diseases of the circulatory system: Secondary | ICD-10-CM

## 2020-03-26 DIAGNOSIS — Z794 Long term (current) use of insulin: Secondary | ICD-10-CM

## 2020-03-26 DIAGNOSIS — Z86718 Personal history of other venous thrombosis and embolism: Secondary | ICD-10-CM

## 2020-03-26 DIAGNOSIS — R1310 Dysphagia, unspecified: Secondary | ICD-10-CM | POA: Diagnosis present

## 2020-03-26 DIAGNOSIS — Z66 Do not resuscitate: Secondary | ICD-10-CM | POA: Diagnosis present

## 2020-03-26 MED ORDER — MAGNESIUM SULFATE 2 GM/50ML IV SOLN
2.0000 g | Freq: Once | INTRAVENOUS | Status: AC
  Administered 2020-03-27: 2 g via INTRAVENOUS
  Filled 2020-03-26: qty 50

## 2020-03-26 MED ORDER — SODIUM CHLORIDE 0.9 % IV BOLUS
500.0000 mL | Freq: Once | INTRAVENOUS | Status: AC
  Administered 2020-03-27: 500 mL via INTRAVENOUS

## 2020-03-26 NOTE — ED Provider Notes (Signed)
[facility] EMERGENCY DEPARTMENT Provider Note   CSN: [id number] Arrival date & time: 03/26/20  2214     History Chief Complaint  Patient presents with  . Seizures    Chad Duke is a 75 y.o. male.  Chad Duke is a 75 y.o. male with a history of hypertension, hyperlipidemia, GERD, CHF, AAA, encephalopathy, COPD, CKD, diabetes, who presents to the ED via EMS from [facility]  Patient resides after he had some seizure-like activity.  No prior history of seizures.  Patient is nonverbal at baseline and is unable to provide any history.  The entirety of history obtained from nurse at [facility] that has been caring for the patient.  She reports that at baseline he is nonverbal, he can follow some commands intermittently.  He is not ambulatory.  Today while at the nursing techs were changing him and getting him cleaned up he had multiple episodes where he had rhythmic shaking-like movements of his arms and legs that lasted 30 seconds to a minute, then he would stop, but had multiple of these episodes over about a 10-minute period.  The nurse has never seen him do anything like this before.  It is difficult to say whether he was more confused afterward due to his already limited mental status at baseline.  They deny any recent known head injury, was evaluated for falls on 2/5-2/6.  Denies any new medication changes, patient has not been having any fevers at facility no other aggravating or alleviating factors.  Level 5 caveat: Nonverbal        Past Medical History:  Diagnosis Date  . AAA (abdominal aortic aneurysm) (HCC)   . Allergy    Rhinitis  . Arthritis    Rhuematoid  . Asthma   . CHF (congestive heart failure) (HCC)    chronic diastolic (congestive) heart failure from notes from [facility] face sheet  . Chronic renal insufficiency   . Depression    per notes from [facility] on chart  . Diverticulosis   . Emphysema   . Falls  frequently   . GERD (gastroesophageal reflux disease)   . Hearing loss of both ears   . Hiatal hernia   . Hyperlipidemia   . Hypertension   . Male hypogonadism   . Nephrolithiasis   . Nephrolithiasis   . Sleep apnea    On CPAP  . Stasis dermatitis   . Varicose veins   . Vitamin B 12 deficiency     Patient Active Problem List   Diagnosis Date Noted  . Severe sepsis (HCC) 03/02/2020  . Parotitis 03/02/2020  . Chronic renal disease, stage III (HCC) 01/25/2018  . Weakness 10/03/2017  . Adult failure to thrive 10/03/2017  . Positive D dimer 09/26/2017  . Elevated troponin 09/26/2017  . Personal history of DVT (deep vein thrombosis) 09/26/2017  . COPD with acute exacerbation (HCC) 09/26/2017  . CAD (coronary artery disease) 09/08/2017  . Goals of care, counseling/discussion   . Palliative care by specialist   . Acute kidney injury superimposed on chronic kidney disease (HCC) 09/02/2017  . Hypokalemia 09/02/2017  . Hypomagnesemia 09/02/2017  . Controlled type 2 diabetes mellitus with hyperglycemia (HCC) 09/02/2017  . Urinary incontinence 09/02/2017  . Fall 08/13/2017  . Acute renal failure superimposed on stage 3 chronic kidney disease (HCC) 08/13/2017  . HLD (hyperlipidemia) 08/13/2017  . Essential hypertension 08/13/2017  . GERD (gastroesophageal reflux disease) 08/13/2017  . Orthostatic hypotension 08/13/2017  . Deep vein thrombosis (DVT)  of both lower extremities (HCC)   . Hyperglycemia   . History of pulmonary embolism 07/20/2017  . Acute on chronic diastolic heart failure (HCC) 07/20/2017  . Bilateral leg edema 07/20/2017  . Acute exacerbation of chronic obstructive pulmonary disease (COPD) (HCC) 07/19/2017  . 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 Cigarette smoker 09/08/2016  . Morbid obesity due to excess calories (HCC) 09/08/2016  . Pain in joint, ankle and foot 03/31/2012  . Abnormality of gait 03/31/2012  . Rheumatoid arthritis (HCC) 03/31/2012  . COPD GOLD  III  02/12/2011  . Sleep apnea 02/12/2011    Past Surgical History:  Procedure Laterality Date  . CARPAL TUNNEL RELEASE     bilateral  . CYSTOSCOPY/URETEROSCOPY/HOLMIUM LASER/STENT PLACEMENT Left 11/02/2018   Procedure: CYSTOSCOPY/URETEROSCOPY/HOLMIUM LASER/STENT PLACEMENT;  Surgeon: Crista Elliot, MD;  Location: [facility] ORS;  Service: Urology;  Laterality: Left;  . FRACTURE SURGERY Left    Fibula  . JOINT REPLACEMENT Left    Shoulder  . JOINT REPLACEMENT Left    Hip   . KIDNEY STONE SURGERY  2003  . left hip replacement    . left knee    . left shoulder    . LITHOTRIPSY         Family History  Problem Relation Age of Onset  . Emphysema Father   . Asthma Father   . Osteoarthritis Father   . Heart attack Mother   . Uterine cancer Mother   . Heart disease Mother   . ADD / ADHD Son   . Depression Son   . Hypertension Brother   . Heart disease Brother        Pacemaker   . Heart murmur Brother     Social History   Tobacco Use  . Smoking status: Current Every Day Smoker    Packs/day: 2.00    Years: 53.00    Pack years: 106.00    Types: Cigarettes  . Smokeless tobacco: Never Used  . Tobacco comment: currently smoking 1ppd--01/09/2020  Vaping Use  . Vaping Use: Never used  Substance Use Topics  . Alcohol use: Not Currently    Alcohol/week: 0.0 standard drinks  . Drug use: No    Home Medications Prior to Admission medications   Medication Sig Start Date End Date Taking? Authorizing Provider  albuterol (PROVENTIL HFA;VENTOLIN HFA) 108 (90 Base) MCG/ACT inhaler Inhale 2 puffs into the lungs every 4 (four) hours as needed for wheezing or shortness of breath. 07/30/17   Nyoka Cowden, MD  allopurinol (ZYLOPRIM) 100 MG tablet Take 100 mg by mouth daily.    [provider]  apixaban (ELIQUIS) 5 MG TABS tablet Take 1 tablet (5 mg total) by mouth 2 (two) times daily. 09/04/17   Albertine Grates, MD  atorvastatin (LIPITOR) 40 MG tablet Take 1 tablet (40 mg total) by  mouth daily. Patient taking differently: Take 40 mg by mouth every evening. 05/18/17 10/03/25  Lewayne Bunting, MD  budesonide (PULMICORT) 0.25 MG/2ML nebulizer solution Take 0.25 mg by nebulization 2 (two) times daily.    [provider]  buPROPion (ZYBAN) 150 MG 12 hr tablet Take 1 tablet (150 mg total) by mouth 2 (two) times daily. 01/09/20   Coralyn Helling, MD  CALCIUM-MAGNESIUM-VITAMIN D PO Take 1 tablet by mouth daily.    [provider]  Cholecalciferol (VITAMIN D-3) 125 MCG (5000 UT) TABS Take 5,000 Units by mouth daily. Patient not taking: Reported on 03/03/2020    [provider]  feeding  supplement (ENSURE ENLIVE / ENSURE PLUS) LIQD Take 237 mLs by mouth 3 (three) times daily between meals. 03/15/20   Ghimire, Werner Lean, MD  folic acid (FOLVITE) 1 MG tablet Take 2 tablets (2 mg total) by mouth daily. 03/14/20   Ghimire, Werner Lean, MD  insulin aspart (NOVOLOG) 100 UNIT/ML injection 0-15 Units, Subcutaneous, 3 times daily with meals CBG < 70: Implement Hypoglycemia measures CBG 70 - 120: 0 units CBG 121 - 150: 2 units CBG 151 - 200: 3 units CBG 201 - 250: 5 units CBG 251 - 300: 8 units CBG 301 - 350: 11 units CBG 351 - 400: 15 units CBG > 400: call MD 03/14/20   Maretta Bees, MD  ipratropium-albuterol (DUONEB) 0.5-2.5 (3) MG/3ML SOLN Take 3 mLs by nebulization 3 (three) times daily.     [provider]  Magnesium 400 MG CAPS Take 400 mg by mouth at bedtime.     [provider]  metFORMIN (GLUCOPHAGE) 500 MG tablet Take 500 mg by mouth 2 (two) times daily. 05/09/19   [provider]  pantoprazole (PROTONIX) 40 MG tablet Take 40 mg by mouth daily.     [provider]  QUEtiapine (SEROQUEL) 25 MG tablet Take 1 tablet (25 mg total) by mouth at bedtime. 03/14/20   Ghimire, Werner Lean, MD  SYMBICORT 160-4.5 MCG/ACT inhaler Inhale 2 puffs into the lungs in the morning and at bedtime. 01/15/20   [provider]    Allergies     Varenicline and Gabapentin  Review of Systems   Review of Systems  Unable to perform ROS: Patient nonverbal    Physical Exam Updated Vital Signs BP 117/75   Pulse 99   Temp (!) 95.8 F (35.4 C) (Rectal)   Resp 18   SpO2 99%   Physical Exam Vitals and nursing note reviewed.  Constitutional:      General: He is not in acute distress.    Appearance: Normal appearance. He is well-developed and well-nourished. He is ill-appearing. He is not diaphoretic.     Comments: Patient is alert but nonverbal, ill-appearing, but not in acute distress  HENT:     Head: Normocephalic and atraumatic.     Mouth/Throat:     Mouth: Oropharynx is clear and moist. Mucous membranes are dry.  Eyes:     General:        Right eye: No discharge.        Left eye: No discharge.     Extraocular Movements: EOM normal.     Pupils: Pupils are equal, round, and reactive to light.  Cardiovascular:     Rate and Rhythm: Regular rhythm. Tachycardia present.     Pulses: Intact distal pulses.     Heart sounds: Murmur heard.  No friction rub. No gallop.      Comments: Mildly tachycardic on arrival, systolic murmur noted Pulmonary:     Effort: Pulmonary effort is normal. No respiratory distress.     Breath sounds: Normal breath sounds. No wheezing or rales.     Comments: Respirations are equal and unlabored on room air, patient with some decreased breath sounds but no focal wheezes, rales or rhonchi Abdominal:     General: Bowel sounds are normal. There is no distension.     Palpations: Abdomen is soft. There is no mass.     Tenderness: There is no abdominal tenderness. There is no guarding.     Comments: Abdomen is soft, nondistended, bowel sounds present throughout, no guarding,  abdomen nontender to palpation in all quadrants  Musculoskeletal:        General: No deformity or edema.     Cervical back: Neck supple.  Skin:    General: Skin is warm and dry.     Capillary Refill: Capillary refill takes less  than 2 seconds.  Neurological:     Mental Status: He is alert.     Coordination: Coordination normal.     Comments: Patient is nonverbal, able to follow some commands.  Opens eyes to voice and is able to squeeze my hands and move his feet. PERRLA, no facial asymmetry noted Moving all extremities independently, normal strength bilaterally with grip. Response to sensation in all extremities. Patient with some intermittent shaking movements in his extremities  Psychiatric:     Comments: Limited assessment as patient is nonverbal     ED Results / Procedures / Treatments   Labs (all labs ordered are listed, but only abnormal results are displayed) Labs Reviewed  RESP PANEL BY RT-PCR (FLU A&B, COVID) ARPGX2 - Abnormal; Notable for the following components:      Result Value   SARS Coronavirus 2 by RT PCR POSITIVE (*)    All other components within normal limits  COMPREHENSIVE METABOLIC PANEL - Abnormal; Notable for the following components:   CO2 21 (*)    Glucose, Bld 109 (*)    Creatinine, Ser 1.69 (*)    Total Protein 6.0 (*)    Albumin 2.1 (*)    GFR, Estimated 42 (*)    All other components within normal limits  URINALYSIS, ROUTINE W REFLEX MICROSCOPIC - Abnormal; Notable for the following components:   APPearance HAZY (*)    Hgb urine dipstick SMALL (*)    Leukocytes,Ua LARGE (*)    WBC, UA >50 (*)    Bacteria, UA RARE (*)    All other components within normal limits  MAGNESIUM - Abnormal; Notable for the following components:   Magnesium 1.3 (*)    All other components within normal limits  CBC WITH DIFFERENTIAL/PLATELET - Abnormal; Notable for the following components:   RBC 2.62 (*)    Hemoglobin 8.3 (*)    HCT 26.0 (*)    RDW 19.6 (*)    All other components within normal limits  I-STAT CHEM 8, ED - Abnormal; Notable for the following components:   Potassium 3.3 (*)    Creatinine, Ser 1.70 (*)    Glucose, Bld 111 (*)    Hemoglobin 8.5 (*)    HCT 25.0 (*)    All  other components within normal limits  CULTURE, BLOOD (ROUTINE X 2)  CULTURE, BLOOD (ROUTINE X 2)  URINE CULTURE  AMMONIA  LACTIC ACID, PLASMA  CBC WITH DIFFERENTIAL/PLATELET  TSH  C-REACTIVE PROTEIN  D-DIMER, QUANTITATIVE (NOT AT [facility])  CORTISOL  APTT  PROTIME-INR  HEPARIN LEVEL (UNFRACTIONATED)    EKG EKG Interpretation  Date/Time:  Tuesday March 26 2020 22:23:47 EST Ventricular Rate:  99 PR Interval:    QRS Duration: 152 QT Interval:  409 QTC Calculation: 525 R Axis:   -31 Text Interpretation: Sinus tachycardia Atrial premature complex Right bundle branch block Inferior infarct, old Probable anterior infarct, age indeterminate Lateral leads are also involved Since last tracing QT prolonged Confirmed by Eber Hong ([id number]) on 03/26/2020 10:34:24 PM   Radiology CT Head Wo Contrast  Result Date: 03/27/2020 CLINICAL DATA:  Seizure. EXAM: CT HEAD WITHOUT CONTRAST TECHNIQUE: Contiguous axial images were obtained from the base of the skull through  the vertex without intravenous contrast. COMPARISON:  May 15, 2020 FINDINGS: Brain: There is mild cerebral atrophy with widening of the extra-axial spaces and ventricular dilatation. There are areas of decreased attenuation within the white matter tracts of the supratentorial brain, consistent with microvascular disease changes. Vascular: No hyperdense vessel or unexpected calcification. Skull: Normal. Negative for fracture or focal lesion. Sinuses/Orbits: A 6 mm x 5 mm left maxillary sinus polyp versus mucous retention cyst is noted. Other: None. IMPRESSION: 1. Mild cerebral atrophy. 2. No acute intracranial abnormality. Electronically Signed   By: Aram Candela M.D.   On: 03/27/2020 02:50   DG Chest Port 1 View  Result Date: 03/26/2020 CLINICAL DATA:  Altered mental status EXAM: PORTABLE CHEST 1 VIEW COMPARISON:  03/17/2020 FINDINGS: Left shoulder replacement. Low lung volumes. No consolidation or effusion. Stable  cardiomediastinal silhouette. No pneumothorax. IMPRESSION: Low lung volumes. Electronically Signed   By: Jasmine Pang M.D.   On: 03/26/2020 22:49    Procedures .Critical Care Performed by: Dartha Lodge, PA-C Authorized by: Dartha Lodge, PA-C   Critical care provider statement:    Critical care time (minutes):  45   Critical care was necessary to treat or prevent imminent or life-threatening deterioration of the following conditions:  Sepsis   Critical care was time spent personally by me on the following activities:  Discussions with consultants, evaluation of patient's response to treatment, examination of patient, ordering and performing treatments and interventions, ordering and review of laboratory studies, ordering and review of radiographic studies, pulse oximetry, re-evaluation of patient's condition, obtaining history from patient or surrogate and review of old charts     Medications Ordered in ED Medications  magnesium sulfate IVPB 2 g 50 mL (has no administration in time range)  sodium chloride 0.9 % bolus 500 mL (has no administration in time range)    ED Course  I have reviewed the triage vital signs and the nursing notes.  Pertinent labs & imaging results that were available during my care of the patient were reviewed by me and considered in my medical decision making (see chart for details).    MDM Rules/Calculators/A&P                         75 y.o. male presents to the ED with complaints of possible seizure-like activity, this involves an extensive number of treatment options, and is a complaint that carries with it a high risk of complications and morbidity.  The differential diagnosis includes seizures, tremor, encephalopathy, intracranial hemorrhage, mass, sepsis, infection metabolic derangement, hyperammonemia  On arrival pt is nonverbal and ill-appearing, per facility at his baseline mental status, difficult to determine if patient is postictal he does have  some intermittent shaking in his extremities but this does not seem to be consistent or seizure-like.  Patient is alert and able to follow some commands while doing these movements.  Very low suspicion for status epilepticus.  On arrival he was found to be hypothermic with temp of 95.8, could be having some shivering movements, placed on Bair hugger.  Initially vitals stable but then patient became progressively hypotensive with blood pressures dropping into the 70s and 80s.  With hypotension and hypothermia concern for sepsis.  Code sepsis initiated with 30 cc/kg fluid bolus and broad-spectrum antibiotics as there is no clear source of infection at this time.  Additional history obtained from SNF nursing staff. Previous records obtained and reviewed via EMR  Lab Tests:  I  Ordered, reviewed, and interpreted labs, which included:  CBC: No leukocytosis, hemoglobin at baseline CMP: No significant electrolyte derangements, creatinine is a bit increased from baseline, 1.69 today, previously 1.64, normal LFTs Lactic acid: Fortunately not elevated at 1.0 Ammonia: WNL  Blood cultures are pending.  No clear metabolic derangement to explain symptoms.  Urinalysis and Covid test are pending  Imaging Studies ordered:  I ordered imaging studies which included chest x-ray and head CT, I independently visualized and interpreted imaging which showed low lung volumes but no obvious cardiopulmonary disease.  On head CT there is some cerebral atrophy but no acute intracranial abnormality  ED Course:   Critical interventions: Bear hugger applied for hypothermia and code sepsis initiated with IV fluids and broad-spectrum antibiotics.  With fluids patient's blood pressure has continued to improve  Case discussed with Dr. Toniann Fail with [facility] hospitalist who will see and admit the patient.  After I spoke with hospitalist urinalysis came back consistent with infection, likely source of patient's sepsis.  Patient  also found to be Covid positive.  Portions of this note were generated with Scientist, clinical (histocompatibility and immunogenetics). Dictation errors may occur despite best attempts at proofreading.  Final Clinical Impression(s) / ED Diagnoses Final diagnoses:  Seizure-like activity (HCC)  Sepsis, due to unspecified organism, unspecified whether acute organ dysfunction present [facility])    Rx / DC Orders ED Discharge Orders    None       Legrand Rams 03/28/20 1717    Eber Hong, MD 03/31/20 [id number]

## 2020-03-26 NOTE — ED Triage Notes (Signed)
Assume care from EMS, ems reports pt is coming from [facility] care and reports around 8pm staff witness "jerky arm movements" as an tonic clonic seizure. EMS reports pt does not have any hx of seizure and also pt nonverbal at baseline, EMS reports given pt 200cc of NS  cbg 155  92/62 HR 100  RR 14

## 2020-03-26 NOTE — ED Provider Notes (Incomplete)
[facility] EMERGENCY DEPARTMENT Provider Note   CSN: [id number] Arrival date & time: 03/26/20  2214     History Chief Complaint  Patient presents with  . Seizures    Chad Duke is a 75 y.o. male.  HPI     Past Medical History:  Diagnosis Date  . AAA (abdominal aortic aneurysm) (HCC)   . Allergy    Rhinitis  . Arthritis    Rhuematoid  . Asthma   . CHF (congestive heart failure) (HCC)    chronic diastolic (congestive) heart failure from notes from [facility] face [facility]  . Chronic renal insufficiency   . Depression    per notes from [facility] on chart  . Diverticulosis   . Emphysema   . Falls frequently   . GERD (gastroesophageal reflux disease)   . Hearing loss of both ears   . Hiatal hernia   . Hyperlipidemia   . Hypertension   . Male hypogonadism   . Nephrolithiasis   . Nephrolithiasis   . Sleep apnea    On CPAP  . Stasis dermatitis   . Varicose veins   . Vitamin B 12 deficiency     Patient Active Problem List   Diagnosis Date Noted  . Severe sepsis (HCC) 03/02/2020  . Parotitis 03/02/2020  . Chronic renal disease, stage III (HCC) 01/25/2018  . Weakness 10/03/2017  . Adult failure to thrive 10/03/2017  . Positive D dimer 09/26/2017  . Elevated troponin 09/26/2017  . Personal history of DVT (deep vein thrombosis) 09/26/2017  . COPD with acute exacerbation (HCC) 09/26/2017  . CAD (coronary artery disease) 09/08/2017  . Goals of care, counseling/discussion   . Palliative care by specialist   . Acute kidney injury superimposed on chronic kidney disease (HCC) 09/02/2017  . Hypokalemia 09/02/2017  . Hypomagnesemia 09/02/2017  . Controlled type 2 diabetes mellitus with hyperglycemia (HCC) 09/02/2017  . Urinary incontinence 09/02/2017  . Fall 08/13/2017  . Acute renal failure superimposed on stage 3 chronic kidney disease (HCC) 08/13/2017  . HLD (hyperlipidemia) 08/13/2017  . Essential hypertension 08/13/2017  . GERD  (gastroesophageal reflux disease) 08/13/2017  . Orthostatic hypotension 08/13/2017  . Deep vein thrombosis (DVT) of both lower extremities (HCC)   . Hyperglycemia   . History of pulmonary embolism 07/20/2017  . Acute on chronic diastolic heart failure (HCC) 07/20/2017  . Bilateral leg edema 07/20/2017  . Acute exacerbation of chronic obstructive pulmonary disease (COPD) (HCC) 07/19/2017  . 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 Cigarette smoker 09/08/2016  . Morbid obesity due to excess calories (HCC) 09/08/2016  . Pain in joint, ankle and foot 03/31/2012  . Abnormality of gait 03/31/2012  . Rheumatoid arthritis (HCC) 03/31/2012  . COPD GOLD III  02/12/2011  . Sleep apnea 02/12/2011    Past Surgical History:  Procedure Laterality Date  . CARPAL TUNNEL RELEASE     bilateral  . CYSTOSCOPY/URETEROSCOPY/HOLMIUM LASER/STENT PLACEMENT Left 11/02/2018   Procedure: CYSTOSCOPY/URETEROSCOPY/HOLMIUM LASER/STENT PLACEMENT;  Surgeon: Crista Elliot, MD;  Location: [facility];  Service: Urology;  Laterality: Left;  . FRACTURE SURGERY Left    Fibula  . JOINT REPLACEMENT Left    Shoulder  . JOINT REPLACEMENT Left    Hip   . KIDNEY STONE SURGERY  2003  . left hip replacement    . left knee    . left shoulder    . LITHOTRIPSY         Family History  Problem Relation Age of Onset  .  Emphysema Father   . Asthma Father   . Osteoarthritis Father   . Heart attack Mother   . Uterine cancer Mother   . Heart disease Mother   . ADD / ADHD Son   . Depression Son   . Hypertension Brother   . Heart disease Brother        Pacemaker   . Heart murmur Brother     Social History   Tobacco Use  . Smoking status: Current Every Day Smoker    Packs/day: 2.00    Years: 53.00    Pack years: 106.00    Types: Cigarettes  . Smokeless tobacco: Never Used  . Tobacco comment: currently smoking 1ppd--01/09/2020  Vaping Use  . Vaping Use: Never used  Substance Use Topics  . Alcohol use: Not  Currently    Alcohol/week: 0.0 standard drinks  . Drug use: No    Home Medications Prior to Admission medications   Medication Sig Start Date End Date Taking? Authorizing Provider  albuterol (PROVENTIL HFA;VENTOLIN HFA) 108 (90 Base) MCG/ACT inhaler Inhale 2 puffs into the lungs every 4 (four) hours as needed for wheezing or shortness of breath. 07/30/17   Nyoka Cowden, MD  allopurinol (ZYLOPRIM) 100 MG tablet Take 100 mg by mouth daily.    [provider]  apixaban (ELIQUIS) 5 MG TABS tablet Take 1 tablet (5 mg total) by mouth 2 (two) times daily. 09/04/17   Albertine Grates, MD  atorvastatin (LIPITOR) 40 MG tablet Take 1 tablet (40 mg total) by mouth daily. Patient taking differently: Take 40 mg by mouth every evening. 05/18/17 10/03/25  Lewayne Bunting, MD  budesonide (PULMICORT) 0.25 MG/2ML nebulizer solution Take 0.25 mg by nebulization 2 (two) times daily.    [provider]  buPROPion (ZYBAN) 150 MG 12 hr tablet Take 1 tablet (150 mg total) by mouth 2 (two) times daily. 01/09/20   Coralyn Helling, MD  CALCIUM-MAGNESIUM-VITAMIN D PO Take 1 tablet by mouth daily.    [provider]  Cholecalciferol (VITAMIN D-3) 125 MCG (5000 UT) TABS Take 5,000 Units by mouth daily. Patient not taking: Reported on 03/03/2020    [provider]  feeding supplement (ENSURE ENLIVE / ENSURE PLUS) LIQD Take 237 mLs by mouth 3 (three) times daily between meals. 03/15/20   Ghimire, Werner Lean, MD  folic acid (FOLVITE) 1 MG tablet Take 2 tablets (2 mg total) by mouth daily. 03/14/20   Ghimire, Werner Lean, MD  insulin aspart (NOVOLOG) 100 UNIT/ML injection 0-15 Units, Subcutaneous, 3 times daily with meals CBG < 70: Implement Hypoglycemia measures CBG 70 - 120: 0 units CBG 121 - 150: 2 units CBG 151 - 200: 3 units CBG 201 - 250: 5 units CBG 251 - 300: 8 units CBG 301 - 350: 11 units CBG 351 - 400: 15 units CBG > 400: call MD 03/14/20   Maretta Bees, MD  ipratropium-albuterol (DUONEB)  0.5-2.5 (3) MG/3ML SOLN Take 3 mLs by nebulization 3 (three) times daily.     [provider]  Magnesium 400 MG CAPS Take 400 mg by mouth at bedtime.     [provider]  metFORMIN (GLUCOPHAGE) 500 MG tablet Take 500 mg by mouth 2 (two) times daily. 05/09/19   [provider]  pantoprazole (PROTONIX) 40 MG tablet Take 40 mg by mouth daily.     [provider]  QUEtiapine (SEROQUEL) 25 MG tablet Take 1 tablet (25 mg total) by mouth at bedtime. 03/14/20   Ghimire,  Werner Lean, MD  SYMBICORT 160-4.5 MCG/ACT inhaler Inhale 2 puffs into the lungs in the morning and at bedtime. 01/15/20   [provider]    Allergies    Varenicline and Gabapentin  Review of Systems   Review of Systems  Physical Exam Updated Vital Signs BP 117/75   Pulse 99   Temp (!) 95.8 F (35.4 C) (Rectal)   Resp 18   SpO2 99%   Physical Exam  ED Results / Procedures / Treatments   Labs (all labs ordered are listed, but only abnormal results are displayed) Labs Reviewed  CULTURE, BLOOD (ROUTINE X 2)  CULTURE, BLOOD (ROUTINE X 2)  URINE CULTURE  COMPREHENSIVE METABOLIC PANEL  LACTIC ACID, PLASMA  LACTIC ACID, PLASMA  URINALYSIS, ROUTINE W REFLEX MICROSCOPIC  CBC WITH DIFFERENTIAL/PLATELET  MAGNESIUM  AMMONIA  I-STAT CHEM 8, ED    EKG EKG Interpretation  Date/Time:  Tuesday March 26 2020 22:23:47 EST Ventricular Rate:  99 PR Interval:    QRS Duration: 152 QT Interval:  409 QTC Calculation: 525 R Axis:   -31 Text Interpretation: Sinus tachycardia Atrial premature complex Right bundle branch block Inferior infarct, old Probable anterior infarct, age indeterminate Lateral leads are also involved Since last tracing QT prolonged Confirmed by Eber Hong ([id number]) on 03/26/2020 10:34:24 PM   Radiology No results found.  Procedures Procedures {Remember to document critical care time when appropriate:1}  Medications Ordered in ED Medications  magnesium sulfate  IVPB 2 g 50 mL (has no administration in time range)  sodium chloride 0.9 % bolus 500 mL (has no administration in time range)    ED Course  I have reviewed the triage vital signs and the nursing notes.  Pertinent labs & imaging results that were available during my care of the patient were reviewed by me and considered in my medical decision making (see chart for details).    MDM Rules/Calculators/A&P                          *** Final Clinical Impression(s) / ED Diagnoses Final diagnoses:  None    Rx / DC Orders ED Discharge Orders    None

## 2020-03-26 NOTE — ED Notes (Signed)
Was only able to get some labs will ask phlebotomy

## 2020-03-26 NOTE — ED Notes (Signed)
bair hugger applied to patient

## 2020-03-26 NOTE — ED Provider Notes (Signed)
Medical screening examination/treatment/procedure(s) were conducted as a shared visit with non-physician practitioner(s) and myself.  I personally evaluated the patient during the encounter.  Clinical Impression:   Final diagnoses:  Seizure-like activity (HCC)  Sepsis, due to unspecified organism, unspecified whether acute organ dysfunction present (HCC)    Pt coming from the NH, states that he had possible seizure-like activity or myoclonic jerking that was witnessed by staff at the nursing facility.  Paramedics report that they thought that he could be postictal but they did not witness any obvious seizure activity, continues to have occasional jerking.  The patient is nonverbal, is not able to follow commands and on my exam has a soft abdomen, soft heart murmur, no significant edema, equal pupils, seems to have all 4 movements with equal occasional asymmetrical jerking-like activity but nothing that is rhythmic.  EKG shows prolonged QT intervals, widened QRS with right bundle blanch block, nonspecific ST segments, this is an abnormal EKG.  The patient will need further work-up as he is on Eliquis to look at his head, electrolytes, potassium level, give magnesium, some fluids, otherwise at this time he appears hemodynamically stable.  CBG was reported to be 155 prehospital.   EKG Interpretation  Date/Time:  Wednesday March 27 2020 06:18:45 EST Ventricular Rate:  94 PR Interval:    QRS Duration: 160 QT Interval:  423 QTC Calculation: 529 R Axis:   -42 Text Interpretation: Sinus rhythm Right bundle branch block Confirmed by Nicanor Alcon, April ([id number]) on 03/28/2020 2:03:51 PM          Eber Hong, MD 03/31/20 [phone]

## 2020-03-27 ENCOUNTER — Emergency Department (HOSPITAL_COMMUNITY): Payer: Medicare Other

## 2020-03-27 ENCOUNTER — Encounter (HOSPITAL_COMMUNITY): Payer: Self-pay | Admitting: Internal Medicine

## 2020-03-27 ENCOUNTER — Inpatient Hospital Stay (HOSPITAL_COMMUNITY): Payer: Medicare Other

## 2020-03-27 DIAGNOSIS — Z66 Do not resuscitate: Secondary | ICD-10-CM | POA: Diagnosis not present

## 2020-03-27 DIAGNOSIS — N179 Acute kidney failure, unspecified: Secondary | ICD-10-CM | POA: Diagnosis present

## 2020-03-27 DIAGNOSIS — N1831 Chronic kidney disease, stage 3a: Secondary | ICD-10-CM | POA: Diagnosis present

## 2020-03-27 DIAGNOSIS — T68XXXA Hypothermia, initial encounter: Secondary | ICD-10-CM

## 2020-03-27 DIAGNOSIS — G9341 Metabolic encephalopathy: Secondary | ICD-10-CM | POA: Diagnosis present

## 2020-03-27 DIAGNOSIS — R627 Adult failure to thrive: Secondary | ICD-10-CM | POA: Diagnosis present

## 2020-03-27 DIAGNOSIS — R569 Unspecified convulsions: Secondary | ICD-10-CM | POA: Diagnosis not present

## 2020-03-27 DIAGNOSIS — Z7189 Other specified counseling: Secondary | ICD-10-CM | POA: Diagnosis not present

## 2020-03-27 DIAGNOSIS — U071 COVID-19: Secondary | ICD-10-CM | POA: Diagnosis present

## 2020-03-27 DIAGNOSIS — Z86711 Personal history of pulmonary embolism: Secondary | ICD-10-CM

## 2020-03-27 DIAGNOSIS — D849 Immunodeficiency, unspecified: Secondary | ICD-10-CM | POA: Diagnosis present

## 2020-03-27 DIAGNOSIS — D638 Anemia in other chronic diseases classified elsewhere: Secondary | ICD-10-CM | POA: Diagnosis present

## 2020-03-27 DIAGNOSIS — E162 Hypoglycemia, unspecified: Secondary | ICD-10-CM | POA: Diagnosis not present

## 2020-03-27 DIAGNOSIS — E860 Dehydration: Secondary | ICD-10-CM | POA: Diagnosis present

## 2020-03-27 DIAGNOSIS — E119 Type 2 diabetes mellitus without complications: Secondary | ICD-10-CM | POA: Diagnosis not present

## 2020-03-27 DIAGNOSIS — Z515 Encounter for palliative care: Secondary | ICD-10-CM | POA: Diagnosis not present

## 2020-03-27 DIAGNOSIS — I6782 Cerebral ischemia: Secondary | ICD-10-CM | POA: Diagnosis present

## 2020-03-27 DIAGNOSIS — I130 Hypertensive heart and chronic kidney disease with heart failure and stage 1 through stage 4 chronic kidney disease, or unspecified chronic kidney disease: Secondary | ICD-10-CM | POA: Diagnosis present

## 2020-03-27 DIAGNOSIS — A419 Sepsis, unspecified organism: Secondary | ICD-10-CM | POA: Diagnosis present

## 2020-03-27 DIAGNOSIS — R1310 Dysphagia, unspecified: Secondary | ICD-10-CM | POA: Diagnosis present

## 2020-03-27 DIAGNOSIS — Z9981 Dependence on supplemental oxygen: Secondary | ICD-10-CM | POA: Diagnosis not present

## 2020-03-27 DIAGNOSIS — G319 Degenerative disease of nervous system, unspecified: Secondary | ICD-10-CM | POA: Diagnosis present

## 2020-03-27 DIAGNOSIS — J449 Chronic obstructive pulmonary disease, unspecified: Secondary | ICD-10-CM | POA: Diagnosis not present

## 2020-03-27 DIAGNOSIS — Z794 Long term (current) use of insulin: Secondary | ICD-10-CM | POA: Diagnosis not present

## 2020-03-27 DIAGNOSIS — N390 Urinary tract infection, site not specified: Secondary | ICD-10-CM | POA: Diagnosis present

## 2020-03-27 DIAGNOSIS — Z86718 Personal history of other venous thrombosis and embolism: Secondary | ICD-10-CM | POA: Diagnosis not present

## 2020-03-27 DIAGNOSIS — I5032 Chronic diastolic (congestive) heart failure: Secondary | ICD-10-CM | POA: Diagnosis present

## 2020-03-27 DIAGNOSIS — E1122 Type 2 diabetes mellitus with diabetic chronic kidney disease: Secondary | ICD-10-CM | POA: Diagnosis present

## 2020-03-27 DIAGNOSIS — E876 Hypokalemia: Secondary | ICD-10-CM | POA: Diagnosis present

## 2020-03-27 LAB — COMPREHENSIVE METABOLIC PANEL
ALT: 20 U/L (ref 0–44)
AST: 23 U/L (ref 15–41)
Albumin: 2.1 g/dL — ABNORMAL LOW (ref 3.5–5.0)
Alkaline Phosphatase: 88 U/L (ref 38–126)
Anion gap: 12 (ref 5–15)
BUN: 22 mg/dL (ref 8–23)
CO2: 21 mmol/L — ABNORMAL LOW (ref 22–32)
Calcium: 9.6 mg/dL (ref 8.9–10.3)
Chloride: 107 mmol/L (ref 98–111)
Creatinine, Ser: 1.69 mg/dL — ABNORMAL HIGH (ref 0.61–1.24)
GFR, Estimated: 42 mL/min — ABNORMAL LOW (ref >60)
Glucose, Bld: 109 mg/dL — ABNORMAL HIGH (ref 70–99)
Potassium: 4 mmol/L (ref 3.5–5.1)
Sodium: 140 mmol/L (ref 135–145)
Total Bilirubin: 0.6 mg/dL (ref 0.3–1.2)
Total Protein: 6 g/dL — ABNORMAL LOW (ref 6.5–8.1)

## 2020-03-27 LAB — URINALYSIS, ROUTINE W REFLEX MICROSCOPIC
Bilirubin Urine: NEGATIVE
Glucose, UA: NEGATIVE mg/dL
Ketones, ur: NEGATIVE mg/dL
Nitrite: NEGATIVE
Protein, ur: NEGATIVE mg/dL
Specific Gravity, Urine: 1.017 (ref 1.005–1.030)
WBC, UA: >50 WBC/hpf — ABNORMAL HIGH (ref 0–5)
pH: 5 (ref 5.0–8.0)

## 2020-03-27 LAB — CBC WITH DIFFERENTIAL/PLATELET
Abs Immature Granulocytes: 0.03 10*3/uL (ref 0.00–0.07)
Basophils Absolute: 0 10*3/uL (ref 0.0–0.1)
Basophils Relative: 0 %
Eosinophils Absolute: 0.1 10*3/uL (ref 0.0–0.5)
Eosinophils Relative: 2 %
HCT: 26 % — ABNORMAL LOW (ref 39.0–52.0)
Hemoglobin: 8.3 g/dL — ABNORMAL LOW (ref 13.0–17.0)
Immature Granulocytes: 0 %
Lymphocytes Relative: 35 %
Lymphs Abs: 2.5 10*3/uL (ref 0.7–4.0)
MCH: 31.7 pg (ref 26.0–34.0)
MCHC: 31.9 g/dL (ref 30.0–36.0)
MCV: 99.2 fL (ref 80.0–100.0)
Monocytes Absolute: 0.8 10*3/uL (ref 0.1–1.0)
Monocytes Relative: 11 %
Neutro Abs: 3.7 10*3/uL (ref 1.7–7.7)
Neutrophils Relative %: 52 %
Platelets: 233 10*3/uL (ref 150–400)
RBC: 2.62 MIL/uL — ABNORMAL LOW (ref 4.22–5.81)
RDW: 19.6 % — ABNORMAL HIGH (ref 11.5–15.5)
WBC: 7.2 10*3/uL (ref 4.0–10.5)
nRBC: 0 % (ref 0.0–0.2)

## 2020-03-27 LAB — APTT: aPTT: 44 seconds — ABNORMAL HIGH (ref 24–36)

## 2020-03-27 LAB — MRSA PCR SCREENING: MRSA by PCR: POSITIVE — AB

## 2020-03-27 LAB — CORTISOL: Cortisol, Plasma: 15.3 ug/dL

## 2020-03-27 LAB — PROTIME-INR
INR: 1.4 — ABNORMAL HIGH (ref 0.8–1.2)
Prothrombin Time: 17 seconds — ABNORMAL HIGH (ref 11.4–15.2)

## 2020-03-27 LAB — I-STAT CHEM 8, ED
BUN: 21 mg/dL (ref 8–23)
Calcium, Ion: 1.3 mmol/L (ref 1.15–1.40)
Chloride: 103 mmol/L (ref 98–111)
Creatinine, Ser: 1.7 mg/dL — ABNORMAL HIGH (ref 0.61–1.24)
Glucose, Bld: 111 mg/dL — ABNORMAL HIGH (ref 70–99)
HCT: 25 % — ABNORMAL LOW (ref 39.0–52.0)
Hemoglobin: 8.5 g/dL — ABNORMAL LOW (ref 13.0–17.0)
Potassium: 3.3 mmol/L — ABNORMAL LOW (ref 3.5–5.1)
Sodium: 141 mmol/L (ref 135–145)
TCO2: 24 mmol/L (ref 22–32)

## 2020-03-27 LAB — RESP PANEL BY RT-PCR (FLU A&B, COVID) ARPGX2
Influenza A by PCR: NEGATIVE
Influenza B by PCR: NEGATIVE
SARS Coronavirus 2 by RT PCR: POSITIVE — AB

## 2020-03-27 LAB — GLUCOSE, CAPILLARY
Glucose-Capillary: 100 mg/dL — ABNORMAL HIGH (ref 70–99)
Glucose-Capillary: 69 mg/dL — ABNORMAL LOW (ref 70–99)
Glucose-Capillary: 75 mg/dL (ref 70–99)
Glucose-Capillary: 84 mg/dL (ref 70–99)
Glucose-Capillary: 85 mg/dL (ref 70–99)

## 2020-03-27 LAB — AMMONIA: Ammonia: 28 umol/L (ref 9–35)

## 2020-03-27 LAB — LACTIC ACID, PLASMA: Lactic Acid, Venous: 1 mmol/L (ref 0.5–1.9)

## 2020-03-27 LAB — MAGNESIUM: Magnesium: 1.3 mg/dL — ABNORMAL LOW (ref 1.7–2.4)

## 2020-03-27 LAB — D-DIMER, QUANTITATIVE: D-Dimer, Quant: 1.75 ug/mL-FEU — ABNORMAL HIGH (ref 0.00–0.50)

## 2020-03-27 LAB — HEPARIN LEVEL (UNFRACTIONATED): Heparin Unfractionated: 2.1 IU/mL — ABNORMAL HIGH (ref 0.30–0.70)

## 2020-03-27 LAB — C-REACTIVE PROTEIN: CRP: 4.8 mg/dL — ABNORMAL HIGH (ref <1.0)

## 2020-03-27 LAB — TSH: TSH: 3.606 u[IU]/mL (ref 0.350–4.500)

## 2020-03-27 MED ORDER — VANCOMYCIN HCL 1500 MG/300ML IV SOLN
1500.0000 mg | Freq: Once | INTRAVENOUS | Status: AC
  Administered 2020-03-27: 1500 mg via INTRAVENOUS
  Filled 2020-03-27: qty 300

## 2020-03-27 MED ORDER — MUPIROCIN 2 % EX OINT
1.0000 "application " | TOPICAL_OINTMENT | Freq: Two times a day (BID) | CUTANEOUS | Status: DC
  Administered 2020-03-27 – 2020-03-29 (×5): 1 via NASAL
  Filled 2020-03-27 (×3): qty 22

## 2020-03-27 MED ORDER — METRONIDAZOLE IN NACL 5-0.79 MG/ML-% IV SOLN
500.0000 mg | Freq: Once | INTRAVENOUS | Status: AC
  Administered 2020-03-27: 500 mg via INTRAVENOUS
  Filled 2020-03-27: qty 100

## 2020-03-27 MED ORDER — METRONIDAZOLE IN NACL 5-0.79 MG/ML-% IV SOLN
500.0000 mg | Freq: Three times a day (TID) | INTRAVENOUS | Status: DC
  Administered 2020-03-27 – 2020-03-28 (×4): 500 mg via INTRAVENOUS
  Filled 2020-03-27 (×4): qty 100

## 2020-03-27 MED ORDER — SODIUM CHLORIDE 0.9 % IV SOLN: 100.0000 mg | Freq: Every day | INTRAVENOUS | Status: DC

## 2020-03-27 MED ORDER — SODIUM CHLORIDE 0.9 % IV SOLN
2.0000 g | Freq: Two times a day (BID) | INTRAVENOUS | Status: DC
  Administered 2020-03-27 – 2020-03-28 (×2): 2 g via INTRAVENOUS
  Filled 2020-03-27 (×2): qty 2

## 2020-03-27 MED ORDER — POTASSIUM CHLORIDE 10 MEQ/100ML IV SOLN
10.0000 meq | Freq: Once | INTRAVENOUS | Status: DC
  Filled 2020-03-27: qty 100

## 2020-03-27 MED ORDER — VANCOMYCIN HCL 1500 MG/300ML IV SOLN: 1500.0000 mg | INTRAVENOUS | Status: DC

## 2020-03-27 MED ORDER — CHLORHEXIDINE GLUCONATE CLOTH 2 % EX PADS
6.0000 | MEDICATED_PAD | Freq: Every day | CUTANEOUS | Status: DC
  Administered 2020-03-28: 6 via TOPICAL

## 2020-03-27 MED ORDER — LACTATED RINGERS IV SOLN: INTRAVENOUS | Status: AC

## 2020-03-27 MED ORDER — SODIUM CHLORIDE 0.9 % IV SOLN
200.0000 mg | Freq: Once | INTRAVENOUS | Status: DC
  Filled 2020-03-27 (×3): qty 40

## 2020-03-27 MED ORDER — SODIUM CHLORIDE 0.9 % IV SOLN
2.0000 g | Freq: Once | INTRAVENOUS | Status: AC
  Administered 2020-03-27: 2 g via INTRAVENOUS
  Filled 2020-03-27: qty 2

## 2020-03-27 MED ORDER — ACETAMINOPHEN 325 MG PO TABS
650.0000 mg | ORAL_TABLET | Freq: Four times a day (QID) | ORAL | Status: DC | PRN
  Filled 2020-03-27: qty 2

## 2020-03-27 MED ORDER — INSULIN ASPART 100 UNIT/ML ~~LOC~~ SOLN: 0.0000 [IU] | SUBCUTANEOUS | Status: DC

## 2020-03-27 MED ORDER — ENOXAPARIN SODIUM 100 MG/ML ~~LOC~~ SOLN
100.0000 mg | Freq: Two times a day (BID) | SUBCUTANEOUS | Status: DC
  Administered 2020-03-27 – 2020-03-29 (×4): 100 mg via SUBCUTANEOUS
  Filled 2020-03-27 (×4): qty 1

## 2020-03-27 MED ORDER — ACETAMINOPHEN 650 MG RE SUPP: 650.0000 mg | Freq: Four times a day (QID) | RECTAL | Status: DC | PRN

## 2020-03-27 MED ORDER — LACTATED RINGERS IV BOLUS (SEPSIS)
2000.0000 mL | Freq: Once | INTRAVENOUS | Status: AC
  Administered 2020-03-27: 2000 mL via INTRAVENOUS

## 2020-03-27 NOTE — Consult Note (Signed)
Neurology Consultation Reason for Consult: Possible Seizure Referring Physician: Toniann Fail, A  CC: Abnormal movements  History is obtained from: Chart review  HPI: Chad Duke is a 75 y.o. male with a history of CHF, rheumatoid arthritis who presents with abnormal movements.  He apparently was having jerky arm movements.  On arrival, he was slightly hypotensive and hypothermic with a temperature of 95 on arrival.  On arrival, he was started on [employer]  He has since been found to have Covid.  He was nonverbal and not following commands.  Due to these movements, neurology has been consulted.  He is apparently not very verbal at baseline, but does follow commands for me today.  ROS:  Unable to obtain due to altered mental status.   Past Medical History:  Diagnosis Date  . AAA (abdominal aortic aneurysm) (HCC)   . Allergy    Rhinitis  . Arthritis    Rhuematoid  . Asthma   . CHF (congestive heart failure) (HCC)    chronic diastolic (congestive) heart failure from notes from [facility] face [facility]  . Chronic renal insufficiency   . Depression    per notes from [facility] on chart  . Diverticulosis   . Emphysema   . Falls frequently   . GERD (gastroesophageal reflux disease)   . Hearing loss of both ears   . Hiatal hernia   . Hyperlipidemia   . Hypertension   . Male hypogonadism   . Nephrolithiasis   . Nephrolithiasis   . Sleep apnea    On CPAP  . Stasis dermatitis   . Varicose veins   . Vitamin B 12 deficiency      Family History  Problem Relation Age of Onset  . Emphysema Father   . Asthma Father   . Osteoarthritis Father   . Heart attack Mother   . Uterine cancer Mother   . Heart disease Mother   . ADD / ADHD Son   . Depression Son   . Hypertension Brother   . Heart disease Brother        Pacemaker   . Heart murmur Brother      Social History:  reports that he has been smoking cigarettes. He has a 106.00 pack-year smoking history. He has never  used smokeless tobacco. He reports previous alcohol use. He reports that he does not use drugs.   Exam: Current vital signs: BP 127/78   Pulse 95   Temp (!) 96.6 F (35.9 C) (Rectal)   Resp (!) 22   SpO2 90%  Vital signs in last 24 hours: Temp:  [95.8 F (35.4 C)-96.6 F (35.9 C)] 96.6 F (35.9 C) (02/16 0600) Pulse Rate:  [94-105] 95 (02/16 0600) Resp:  [17-26] 22 (02/16 0345) BP: (93-127)/(65-94) 127/78 (02/16 0545) SpO2:  [90 %-99 %] 90 % (02/16 0600)   Physical Exam  Constitutional: Appears well-developed and well-nourished.  Psych: Affect appropriate to situation Eyes: No scleral injection HENT: No OP obstruction MSK: no joint deformities.  Cardiovascular: Normal rate and regular rhythm.  Respiratory: Effort normal, non-labored breathing GI: Soft.  No distension. There is no tenderness.  Skin: WDI  Neuro: Mental Status: Patient is awake, alert, he follows commands bilaterally to squeeze hands, as well as show teeth, but his speech is mumbled and I cannot make out any words.  He does not and shake his head Cranial Nerves: II: Nods his head when I ask if he sees fingers wiggling bilaterally pupils are equal, round, and reactive to light.  III,IV, VI: EOMI without ptosis or diploplia.  V: Facial sensation is symmetric to temperature VII: Facial movement with?  Mild left decreased nasolabial fold Motor: He moves all extremities spontaneously and to noxious stimulation.  He has tremulous shivering type movements fairly consistently. sensory: Response stimulation bilaterally Cerebellar: Does not perform  I have reviewed labs in epic and the results pertinent to this consultation are: UA with large leukocytes Sodium 141 Creatinine 1.7, up from 1.411 days ago  I have reviewed the images obtained: CT head is unremarkable  Impression: 75 year old male with tremulous type activity in the setting of hypothermia with suggestion of UTI and Covid.  The movements that I  witnessed appear more consistent with shivering than seizure activity.  Certainly with his following commands, I am not concerned for ongoing status epilepticus.  The movements seen earlier could have been a more severe shivering versus polymyoclonus secondary to his general medical condition.  With his speech change, I do think an MRI is reasonable, as is an EEG, but if these are negative I would not pursue further work-up from a neurological perspective.  Recommendations: 1) MRI brain 2) EEG 3) neurology will follow up on the studies, but if negative neurology will be available on an as-needed basis.  Ritta Slot, MD [facility] [phone]  If 7pm- 7am, please page neurology on call as listed in AMION.

## 2020-03-27 NOTE — Evaluation (Signed)
Clinical/Bedside Swallow Evaluation Patient Details  Name: Chad Duke MRN: [id number] Date of Birth: [birth year]  Today's Date: 03/27/2020 Time: SLP Start Time (ACUTE ONLY): 1238 SLP Stop Time (ACUTE ONLY): 1250 SLP Time Calculation (min) (ACUTE ONLY): 12 min  Past Medical History:  Past Medical History:  Diagnosis Date  . AAA (abdominal aortic aneurysm) (HCC)   . Allergy    Rhinitis  . Arthritis    Rhuematoid  . Asthma   . CHF (congestive heart failure) (HCC)    chronic diastolic (congestive) heart failure from notes from [facility]  . Chronic renal insufficiency   . Depression    per notes from [facility] on chart  . Diverticulosis   . Emphysema   . Falls frequently   . GERD (gastroesophageal reflux disease)   . Hearing loss of both ears   . Hiatal hernia   . Hyperlipidemia   . Hypertension   . Male hypogonadism   . Nephrolithiasis   . Nephrolithiasis   . Sleep apnea    On CPAP  . Stasis dermatitis   . Varicose veins   . Vitamin B 12 deficiency    Past Surgical History:  Past Surgical History:  Procedure Laterality Date  . CARPAL TUNNEL RELEASE     bilateral  . CYSTOSCOPY/URETEROSCOPY/HOLMIUM LASER/STENT PLACEMENT Left 11/02/2018   Procedure: CYSTOSCOPY/URETEROSCOPY/HOLMIUM LASER/STENT PLACEMENT;  Surgeon: Crista Elliot, MD;  Location: [facility];  Service: Urology;  Laterality: Left;  . FRACTURE SURGERY Left    Fibula  . JOINT REPLACEMENT Left    Shoulder  . JOINT REPLACEMENT Left    Hip   . KIDNEY STONE SURGERY  2003  . left hip replacement    . left knee    . left shoulder    . LITHOTRIPSY     HPI:  Pt is a 75 y.o. male with history of rheumatoid arthritis on Remicade, diabetes mellitus type 2, chronic kidney disease stage III, chronic anemia, recently diagnosed with COVID infection in January 2022 admitted recently for sepsis from parotitis and discharged on 02/12/20. Pt was found to have increasing abnormal movements of the extremities  concerning for seizures 24 hours prior to admission and was brought to the ED.   CT head 2/16 revealed mild cerebral atrophybut was otherwise negative. CXR 2/15: No consolidation or  effusion. Pt was seen during last admission following diagnosis of anteriorly subluxed left mandible by ENT and metabolic encephalopathy. He was on a dysphagia 2 diet with thin liquids during the last admission. EEG 2/16: moderate diffuse encephalopathy, nonspecific etiology. No seizures or epileptiform discharges   Assessment / Plan / Recommendation Clinical Impression  Pt was seen for bedside swallow evaluation. He was alert and cooperative during the session, but confused and his responses were often unrelated to the question/conversational topic. Oral mechanism exam was limited due to pt's difficulty following commands; however, oral motor strength and ROM appeared grossly WFL. Dentition was reduced. No s/sx of aspiration were noted with solids or liquids. Mastication was prolonged with regular texture solids, but functional for dysphagia 3 solids. Mild lingual residue was noted with dysphagia 3 solids and moderate with regular textures. Residue was cleared with liquid washes. A dysphagia 2 diet with thin liquids will be initiated at this time with potential for advancement to dysphagia 3. SLP will follow pt. SLP Visit Diagnosis: Dysphagia, unspecified (R13.10)    Aspiration Risk  Mild aspiration risk    Diet Recommendation Dysphagia 2 (Fine chop);Thin liquid   Liquid Administration via:  Cup;Straw Medication Administration: Whole meds with puree Supervision: Staff to assist with self feeding Compensations: Slow rate;Small sips/bites Postural Changes: Seated upright at 90 degrees    Other  Recommendations Oral Care Recommendations: Oral care BID   Follow up Recommendations Skilled Nursing facility      Frequency and Duration min 2x/week  2 weeks       Prognosis Prognosis for Safe Diet Advancement: Good       Swallow Study   General Date of Onset: 03/14/20 HPI: Pt is a 75 y.o. male with history of rheumatoid arthritis on Remicade, diabetes mellitus type 2, chronic kidney disease stage III, chronic anemia, recently diagnosed with COVID infection in January 2022 admitted recently for sepsis from parotitis and discharged on 02/12/20. Pt was found to have increasing abnormal movements of the extremities concerning for seizures 24 hours prior to admission and was brought to the ED.   CT head 2/16 revealed mild cerebral atrophybut was otherwise negative. CXR 2/15: No consolidation or  effusion. Pt was seen during last admission following diagnosis of anteriorly subluxed left mandible by ENT and metabolic encephalopathy. He was on a dysphagia 2 diet with thin liquids during the last admission. EEG 2/16: moderate diffuse encephalopathy, nonspecific etiology. No seizures or epileptiform discharges Type of Study: Bedside Swallow Evaluation Previous Swallow Assessment: See HPI Diet Prior to this Study: NPO Temperature Spikes Noted: No Respiratory Status: Room air History of Recent Intubation: No Behavior/Cognition: Cooperative;Pleasant mood;Lethargic/Drowsy Oral Cavity Assessment: Dry Oral Care Completed by SLP: No Oral Cavity - Dentition: Poor condition Self-Feeding Abilities: Total assist Patient Positioning: Upright in bed;Postural control adequate for testing Baseline Vocal Quality: Normal Volitional Cough: Cognitively unable to elicit Volitional Swallow: Unable to elicit    Oral/Motor/Sensory Function Overall Oral Motor/Sensory Function:  (WFL though limited)   Ice Chips Ice chips: Within functional limits   Thin Liquid Thin Liquid: Within functional limits Presentation: Cup;Straw    Nectar Thick Nectar Thick Liquid: Not tested   Honey Thick Honey Thick Liquid: Not tested   Puree Puree: Within functional limits   Solid     Solid: Impaired Presentation: Self Fed Oral Phase Impairments:  Impaired mastication Oral Phase Functional Implications: Oral residue     [name] I. Vear Clock, MS, CCC-SLP Acute Rehabilitation Services Office number [phone] Pager [phone]  [name] 03/27/2020,1:25 PM

## 2020-03-27 NOTE — ED Notes (Signed)
Attempts x1 to call report to floor

## 2020-03-27 NOTE — ED Notes (Signed)
Condom cath place on pt & MD is aware of pt soft BP, This RN called CT for them to get patient for his scan

## 2020-03-27 NOTE — Progress Notes (Signed)
Following for Code Sepsis  

## 2020-03-27 NOTE — Progress Notes (Signed)
Pt admitted to 5w room 14; VS taken.  Tele connected.  Patient not A&O so bed alarm set.  Report given to day shift; pt. To have an MRI today.  Call bell and alarm set on bed.

## 2020-03-27 NOTE — Progress Notes (Signed)
PROGRESS NOTE    Chad Duke  BJY:[id number] DOB: 1945/12/14 DOA: 03/26/2020 PCP: Soundra Pilon, FNP    Chief Complaint  Patient presents with  . Seizures    Brief Narrative:  Chad Duke is a 75 y.o. male with history of rheumatoid arthritis on Remicade, diabetes mellitus type 2, chronic kidney disease stage III, chronic anemia, recently diagnosed with Covid infection in January 2022 admitted recently for sepsis from parotitis and discharged on March 14, 2020 was found to have increasing abnormal movements of the extremities concerning for seizures over the last 24 hours and was brought to the ER.  Patient as per the patient's niece has become increasingly confused and not himself last few weeks.  Subjective:  Remain confused, does not follow command, occasionally congested cough, no hypoxia  Assessment & Plan:   Principal Problem:   Hypothermia Active Problems:   COPD GOLD III    Rheumatoid arthritis (HCC)   History of pulmonary embolism   Essential hypertension   COVID-19 virus infection  Possible early sepsis on presentation, with hypothermia, sinus tachycardia, tachypnea, suspect from UTI, he has normal WBC and normal lactic acid Acute metabolic encephalopathy likely from sepsis present on admission, abnormal jerky movement of extremities, MRI brain pending, EEG negative for seizure, neurology consulted who think seizure is less likely, jerking movement likely from sepsis Blood culture and urine culture in process He is started on vanc cefepime and Flagyl on admission, due to immunosuppressed status We will continue current antibiotics, follow culture result Will add on abg  Dysphagia -Speech recommended dysphagia diet to thin liquid  Hypokalemia/hypomagnesemia, replace K and mag, recheck in the morning  AKI on CKD III -Baseline creatinine appear around 1.1 -Creatinine today at 1.7 -Renal dosing meds  Anemia of chronic disease -Hemoglobin stable at  baseline above eight  Diastolic CHF Does not appear to be on Lasix at home  COPD, home O2 dependent  History of DVT and PE on Eliquis As has left pedal edema on exam He was started on heparin drip due to altered mental status n.p.o. on admission Change to Lovenox, likely able to transition to Eliquis once mental status improves  Insulin-dependent type 2 diabetes, controlled On Metformin and sliding scale insulin at home Hold Metformin Continue sliding scale  Rheumatoid arthritis/immunosuppressed status Reported on Remicade    He was diagnosed with Covid in January in SNF, was treated,  However repeat Covid screening test persistently positive Chest x-ray this admission no acute findings, he is on room air, there is no hypoxia CRP and D-dimer ordered on admission, I am not sure this will be reliable in the setting of acute infection Will monitor  MRSA screening positive, start decolonization, contact precaution   Skin Assessment:  I have examined the patient's skin and I agree with the wound assessment as performed by the wound care RN as outlined below:  Pressure Injury 03/06/20 Heel Left Deep Tissue Pressure Injury - Purple or maroon localized area of discolored intact skin or blood-filled blister due to damage of underlying soft tissue from pressure and/or shear. (Active)  03/06/20 1932  Location: Heel  Location Orientation: Left  Staging: Deep Tissue Pressure Injury - Purple or maroon localized area of discolored intact skin or blood-filled blister due to damage of underlying soft tissue from pressure and/or shear.  Wound Description (Comments):   Present on Admission:     Unresulted Labs (From admission, onward)          Start  Ordered   03/28/20 0500  CBC with Differential/Platelet  Daily,   R      03/27/20 0523   03/28/20 0500  Comprehensive metabolic panel  Daily,   R      03/27/20 0523   03/28/20 0500  C-reactive protein  Daily,   R      03/27/20 0523    03/28/20 0500  D-dimer, quantitative (not at [facility])  Daily,   R      03/27/20 0523   03/27/20 0757  MRSA PCR Screening  Once,   R        03/27/20 0756   03/27/20 0528  APTT  ONCE - STAT,   STAT        03/27/20 0527   03/27/20 0528  Protime-INR  ONCE - STAT,   STAT        03/27/20 0527   03/27/20 0528  Heparin level (unfractionated)  ONCE - STAT,   STAT        03/27/20 0527   03/27/20 0524  Cortisol  ONCE - STAT,   STAT        03/27/20 0523   03/27/20 0522  C-reactive protein  Once,   STAT        03/27/20 0523   03/27/20 0522  D-dimer, quantitative (not at [facility])  Once,   STAT        03/27/20 0523   03/27/20 0220  TSH  ONCE - STAT,   STAT        03/27/20 0219   03/26/20 2232  CBC with Differential  Once,   STAT        03/26/20 2232   03/26/20 2231  Culture, blood (routine x 2)  BLOOD CULTURE X 2,   STAT      03/26/20 2232   03/26/20 2231  Urine culture  ONCE - STAT,   STAT        03/26/20 2232            DVT prophylaxis: On therapeutic Lovenox   Code Status: DNR Family Communication: Niece over the phone Disposition:   Status is: Inpatient   Dispo: The patient is from: SNF              Anticipated d/c is to: SNF              Anticipated d/c date is: Not medically ready to discharge                Consultants:   Neurology  Procedures:   EEG  Antimicrobials:    vancomycin/cefepime/Flagyl     Objective: Vitals:   03/27/20 0545 03/27/20 0600 03/27/20 0656 03/27/20 0740  BP: 127/78  118/78 113/76  Pulse: 99 95 97 96  Resp:   19 15  Temp:  (!) 96.6 F (35.9 C) (!) 97.5 F (36.4 C) 97.6 F (36.4 C)  TempSrc:  Rectal Axillary Axillary  SpO2: 94% 90% 96% 94%    Intake/Output Summary (Last 24 hours) at 03/27/2020 0758 Last data filed at 03/27/2020 0456 Gross per 24 hour  Intake 2700 ml  Output --  Net 2700 ml   There were no vitals filed for this visit.  Examination:  General exam: Pleasantly confused, intermittent shivering, occasionally  congested cough, nonproductive Respiratory system: Clear to auscultation. Respiratory effort normal. Cardiovascular system: S1 & S2 heard, RRR. No JVD, no murmur, No pedal edema. Gastrointestinal system: Abdomen is nondistended, soft and nontender. No organomegaly or masses felt. Normal bowel sounds  heard. Central nervous system: Pleasantly confused, moving all extremities. Extremities: Left pedal pitting edema Skin: Chronic venous stasis change bilateral lower extremity Psychiatry: Pleasantly confused.     Data Reviewed: I have personally reviewed following labs and imaging studies  CBC: Recent Labs  Lab 03/27/20 0047 03/27/20 0048  WBC 7.2  --   NEUTROABS 3.7  --   HGB 8.3* 8.5*  HCT 26.0* 25.0*  MCV 99.2  --   PLT 233  --     Basic Metabolic Panel: Recent Labs  Lab 03/27/20 0047 03/27/20 0048  NA 140 141  K 4.0 3.3*  CL 107 103  CO2 21*  --   GLUCOSE 109* 111*  BUN 22 21  CREATININE 1.69* 1.70*  CALCIUM 9.6  --   MG 1.3*  --     GFR: CrCl cannot be calculated (Unknown ideal weight.).  Liver Function Tests: Recent Labs  Lab 03/27/20 0047  AST 23  ALT 20  ALKPHOS 88  BILITOT 0.6  PROT 6.0*  ALBUMIN 2.1*    CBG: Recent Labs  Lab 03/27/20 0742  GLUCAP 75     Recent Results (from the past 240 hour(s))  Resp Panel by RT-PCR (Flu A&B, Covid) Nasopharyngeal Swab     Status: Abnormal   Collection Time: 03/27/20  4:15 AM   Specimen: Nasopharyngeal Swab; Nasopharyngeal(NP) swabs in vial transport medium  Result Value Ref Range Status   SARS Coronavirus 2 by RT PCR POSITIVE (A) NEGATIVE Final    Comment: RESULT CALLED TO, READ BACK BY AND VERIFIED WITH: J DODD RN 03/27/20 0507 JDW (NOTE) SARS-CoV-2 target nucleic acids are DETECTED.  The SARS-CoV-2 RNA is generally detectable in upper respiratory specimens during the acute phase of infection. Positive results are indicative of the presence of the identified virus, but do not rule out bacterial  infection or co-infection with other pathogens not detected by the test. Clinical correlation with patient history and other diagnostic information is necessary to determine patient infection status. The expected result is Negative.  Fact Sheet for Patients: BloggerCourse.com  Fact Sheet for Healthcare Providers: SeriousBroker.it  This test is not yet approved or cleared by the [place] FDA and  has been authorized for detection and/or diagnosis of SARS-CoV-2 by FDA under an Emergency Use Authorization (EUA).  This EUA will remain in effect (meaning this test can be used)  for the duration of  the COVID-19 declaration under Section 564(b)(1) of the Act, 21 U.S.C. section 360bbb-3(b)(1), unless the authorization is terminated or revoked sooner.     Influenza A by PCR NEGATIVE NEGATIVE Final   Influenza B by PCR NEGATIVE NEGATIVE Final    Comment: (NOTE) The Xpert Xpress SARS-CoV-2/FLU/RSV plus assay is intended as an aid in the diagnosis of influenza from Nasopharyngeal swab specimens and should not be used as a sole basis for treatment. Nasal washings and aspirates are unacceptable for Xpert Xpress SARS-CoV-2/FLU/RSV testing.  Fact Sheet for Patients: BloggerCourse.com  Fact Sheet for Healthcare Providers: SeriousBroker.it  This test is not yet approved or cleared by the [place] FDA and has been authorized for detection and/or diagnosis of SARS-CoV-2 by FDA under an Emergency Use Authorization (EUA). This EUA will remain in effect (meaning this test can be used) for the duration of the COVID-19 declaration under Section 564(b)(1) of the Act, 21 U.S.C. section 360bbb-3(b)(1), unless the authorization is terminated or revoked.  Performed at [facility], [address] [place], [place] [address]  Radiology Studies: CT Head Wo Contrast  Result  Date: 03/27/2020 CLINICAL DATA:  Seizure. EXAM: CT HEAD WITHOUT CONTRAST TECHNIQUE: Contiguous axial images were obtained from the base of the skull through the vertex without intravenous contrast. COMPARISON:  May 15, 2020 FINDINGS: Brain: There is mild cerebral atrophy with widening of the extra-axial spaces and ventricular dilatation. There are areas of decreased attenuation within the white matter tracts of the supratentorial brain, consistent with microvascular disease changes. Vascular: No hyperdense vessel or unexpected calcification. Skull: Normal. Negative for fracture or focal lesion. Sinuses/Orbits: A 6 mm x 5 mm left maxillary sinus polyp versus mucous retention cyst is noted. Other: None. IMPRESSION: 1. Mild cerebral atrophy. 2. No acute intracranial abnormality. Electronically Signed   By: Aram Candela M.D.   On: 03/27/2020 02:50   DG Chest Port 1 View  Result Date: 03/26/2020 CLINICAL DATA:  Altered mental status EXAM: PORTABLE CHEST 1 VIEW COMPARISON:  03/17/2020 FINDINGS: Left shoulder replacement. Low lung volumes. No consolidation or effusion. Stable cardiomediastinal silhouette. No pneumothorax. IMPRESSION: Low lung volumes. Electronically Signed   By: Jasmine Pang M.D.   On: 03/26/2020 22:49        Scheduled Meds: . insulin aspart  0-9 Units Subcutaneous Q4H   Continuous Infusions: . ceFEPime (MAXIPIME) IV    . lactated ringers    . metronidazole    . [START ON 03/29/2020] vancomycin       LOS: 0 days   Time spent: Greater than 50% of this time was spent in counseling, explanation of diagnosis, planning of further management, and coordination of care.   Voice Recognition Reubin Milan dictation system was used to create this note, attempts have been made to correct errors. Please contact the author with questions and/or clarifications.   Albertine Grates, MD PhD FACP [facility]  Available via Epic secure chat 7am-7pm for nonurgent issues Please page for  urgent issues To page the attending provider between 7A-7P or the covering provider during after hours 7P-7A, please log into the web site [email or web address] and access using universal [place] password for that web site. If you do not have the password, please call the hospital operator.    03/27/2020, 7:58 AM

## 2020-03-27 NOTE — Progress Notes (Signed)
Patient arrives to hospital from [facility]. Patient tested COVID positive upon a previous admission on 03/02/20.  Ahad Colarusso LCSW

## 2020-03-27 NOTE — Progress Notes (Signed)
ANTICOAGULATION CONSULT NOTE - Initial Consult  Pharmacy Consult for Lovenox Indication: Bridging for hx of PE  Allergies  Allergen Reactions  . Varenicline Other (See Comments)    Suicidal thoughts Bad dreams Other reaction(s): Other (See Comments) Suicidal thoughts  . Gabapentin Other (See Comments)    dizziness    Patient Measurements: Weight: 97.1 kg (214 lb) Heparin Dosing Weight:   Vital Signs: Temp: 97.6 F (36.4 C) (02/16 0740) Temp Source: Axillary (02/16 0740) BP: 113/76 (02/16 0740) Pulse Rate: 96 (02/16 0740)  Labs: Recent Labs    03/27/20 0047 03/27/20 0048 03/27/20 0811  HGB 8.3* 8.5*  --   HCT 26.0* 25.0*  --   PLT 233  --   --   APTT  --   --  44*  LABPROT  --   --  17.0*  INR  --   --  1.4*  CREATININE 1.69* 1.70*  --     Estimated Creatinine Clearance: 42.4 mL/min (A) (by C-G formula based on SCr of 1.7 mg/dL (H)).   Medical History: Past Medical History:  Diagnosis Date  . AAA (abdominal aortic aneurysm) (HCC)   . Allergy    Rhinitis  . Arthritis    Rhuematoid  . Asthma   . CHF (congestive heart failure) (HCC)    chronic diastolic (congestive) heart failure from notes from [facility] face sheet  . Chronic renal insufficiency   . Depression    per notes from [facility] on chart  . Diverticulosis   . Emphysema   . Falls frequently   . GERD (gastroesophageal reflux disease)   . Hearing loss of both ears   . Hiatal hernia   . Hyperlipidemia   . Hypertension   . Male hypogonadism   . Nephrolithiasis   . Nephrolithiasis   . Sleep apnea    On CPAP  . Stasis dermatitis   . Varicose veins   . Vitamin B 12 deficiency     Medications:  Medications Prior to Admission  Medication Sig Dispense Refill Last Dose  . albuterol (PROVENTIL HFA;VENTOLIN HFA) 108 (90 Base) MCG/ACT inhaler Inhale 2 puffs into the lungs every 4 (four) hours as needed for wheezing or shortness of breath. 3 Inhaler 1   . allopurinol (ZYLOPRIM) 100 MG  tablet Take 100 mg by mouth daily.     Marland Kitchen apixaban (ELIQUIS) 5 MG TABS tablet Take 1 tablet (5 mg total) by mouth 2 (two) times daily. 1 tablet 0   . atorvastatin (LIPITOR) 40 MG tablet Take 1 tablet (40 mg total) by mouth daily. (Patient taking differently: Take 40 mg by mouth every evening.) 90 tablet 3   . budesonide (PULMICORT) 0.25 MG/2ML nebulizer solution Take 0.25 mg by nebulization 2 (two) times daily.     Marland Kitchen buPROPion (ZYBAN) 150 MG 12 hr tablet Take 1 tablet (150 mg total) by mouth 2 (two) times daily. 60 tablet 3   . CALCIUM-MAGNESIUM-VITAMIN D PO Take 1 tablet by mouth daily.     . Cholecalciferol (VITAMIN D-3) 125 MCG (5000 UT) TABS Take 5,000 Units by mouth daily. (Patient not taking: Reported on 03/03/2020)     . feeding supplement (ENSURE ENLIVE / ENSURE PLUS) LIQD Take 237 mLs by mouth 3 (three) times daily between meals. 237 mL 12   . folic acid (FOLVITE) 1 MG tablet Take 2 tablets (2 mg total) by mouth daily.     . insulin aspart (NOVOLOG) 100 UNIT/ML injection 0-15 Units, Subcutaneous, 3 times daily with meals CBG <  70: Implement Hypoglycemia measures CBG 70 - 120: 0 units CBG 121 - 150: 2 units CBG 151 - 200: 3 units CBG 201 - 250: 5 units CBG 251 - 300: 8 units CBG 301 - 350: 11 units CBG 351 - 400: 15 units CBG > 400: call MD 10 mL 11   . ipratropium-albuterol (DUONEB) 0.5-2.5 (3) MG/3ML SOLN Take 3 mLs by nebulization 3 (three) times daily.      . Magnesium 400 MG CAPS Take 400 mg by mouth at bedtime.      . metFORMIN (GLUCOPHAGE) 500 MG tablet Take 500 mg by mouth 2 (two) times daily.     . pantoprazole (PROTONIX) 40 MG tablet Take 40 mg by mouth daily.      . QUEtiapine (SEROQUEL) 25 MG tablet Take 1 tablet (25 mg total) by mouth at bedtime.     . SYMBICORT 160-4.5 MCG/ACT inhaler Inhale 2 puffs into the lungs in the morning and at bedtime.      Scheduled:  . enoxaparin (LOVENOX) injection  100 mg Subcutaneous Q12H  . insulin aspart  0-9 Units Subcutaneous Q4H    Infusions:  . ceFEPime (MAXIPIME) IV    . lactated ringers 75 mL/hr at 03/27/20 0801  . metronidazole    . [START ON 03/29/2020] vancomycin      Assessment: Pt was admitted for suspect sz but neurology suggested that shivering is more likely. He can't answer as to when the last dose of apixaban was taken. D/w Dr. Roda Shutters and we will use SQ lovenox instead for bridging since his CrCl>30 ml/min. We will started it tonight.   Goal of Therapy:  Anti-Xa level 0.6-1 units/ml 4hrs after LMWH dose given Monitor platelets by anticoagulation protocol: Yes   Plan:  Lovenox 100mg  SQ BID start tonight at 2000 F/u resume of apixaban  , PharmD, BCIDP, AAHIVP, CPP Infectious Disease Pharmacist 03/27/2020 9:50 AM

## 2020-03-27 NOTE — Progress Notes (Signed)
EEG completed, results pending. 

## 2020-03-27 NOTE — Progress Notes (Signed)
Pharmacy Antibiotic Note  Chad Duke is a 75 y.o. male admitted on 03/26/2020 with possible sepsis.  Pharmacy has been consulted for Vancomycin and Cefepime dosing.     Plan: Vancomycin 1500 mg IV as ordered, then Vancomycin 1500 mg IV q48h Cefepime 2 g IV q12h     Temp (24hrs), Avg:95.8 F (35.4 C), Min:95.8 F (35.4 C), Max:95.8 F (35.4 C)  Recent Labs  Lab 03/27/20 0047 03/27/20 0048  WBC 7.2  --   CREATININE 1.69* 1.70*  LATICACIDVEN 1.0  --     CrCl cannot be calculated (Unknown ideal weight.).    Allergies  Allergen Reactions  . Varenicline Other (See Comments)    Suicidal thoughts Bad dreams Other reaction(s): Other (See Comments) Suicidal thoughts  . Gabapentin Other (See Comments)    dizziness     Chad Duke 03/27/2020 5:27 AM

## 2020-03-27 NOTE — Progress Notes (Signed)
CRITICAL VALUE ALERT  Critical Value:  MRSA PCR +  Date & Time Notied: 03/27/20 1310  Provider Notified: Dr. Roda Shutters  Orders Received/Actions taken: MRSA protocol initiated.

## 2020-03-27 NOTE — H&P (Signed)
History and Physical    Chad Duke:[id number] DOB: May 23, 1945 DOA: 03/26/2020  PCP: Soundra Pilon, FNP  Patient coming from: Skilled nursing facility.  Chief Complaint: Abnormal jerking movements of the extremities.  History obtained from ER physician and patient's niece.  Previous records.  HPI: Chad Duke is a 75 y.o. male with history of rheumatoid arthritis on Remicade, diabetes mellitus type 2, chronic kidney disease stage III, chronic anemia, recently diagnosed with Covid infection in January 2022 admitted recently for sepsis from parotitis and discharged on March 14, 2020 was found to have increasing abnormal movements of the extremities concerning for seizures over the last 24 hours and was brought to the ER.  Patient as per the patient's niece has become increasingly confused and not himself last few weeks.  ED Course: In the ER patient appears confused CT head was unremarkable patient was hypothermic with temperature 95 F blood pressure systolic in the low normal's.  UA is concerning for UTI Covid test was positive and has been positive since January last month.  EKG shows normal sinus rhythm with RBBB.  Patient had blood cultures drawn and started on empiric antibiotics since patient was hypothermic with UTI concerning for possible sepsis.  Neurology consulted for further evaluation of the abnormal movements.  Review of Systems: As per HPI, rest all negative.   Past Medical History:  Diagnosis Date  . AAA (abdominal aortic aneurysm) (HCC)   . Allergy    Rhinitis  . Arthritis    Rhuematoid  . Asthma   . CHF (congestive heart failure) (HCC)    chronic diastolic (congestive) heart failure from notes from [facility] face sheet  . Chronic renal insufficiency   . Depression    per notes from [facility] on chart  . Diverticulosis   . Emphysema   . Falls frequently   . GERD (gastroesophageal reflux disease)   . Hearing loss of both ears   . Hiatal hernia    . Hyperlipidemia   . Hypertension   . Male hypogonadism   . Nephrolithiasis   . Nephrolithiasis   . Sleep apnea    On CPAP  . Stasis dermatitis   . Varicose veins   . Vitamin B 12 deficiency     Past Surgical History:  Procedure Laterality Date  . CARPAL TUNNEL RELEASE     bilateral  . CYSTOSCOPY/URETEROSCOPY/HOLMIUM LASER/STENT PLACEMENT Left 11/02/2018   Procedure: CYSTOSCOPY/URETEROSCOPY/HOLMIUM LASER/STENT PLACEMENT;  Surgeon: Crista Elliot, MD;  Location: [facility];  Service: Urology;  Laterality: Left;  . FRACTURE SURGERY Left    Fibula  . JOINT REPLACEMENT Left    Shoulder  . JOINT REPLACEMENT Left    Hip   . KIDNEY STONE SURGERY  2003  . left hip replacement    . left knee    . left shoulder    . LITHOTRIPSY       reports that he has been smoking cigarettes. He has a 106.00 pack-year smoking history. He has never used smokeless tobacco. He reports previous alcohol use. He reports that he does not use drugs.  Allergies  Allergen Reactions  . Varenicline Other (See Comments)    Suicidal thoughts Bad dreams Other reaction(s): Other (See Comments) Suicidal thoughts  . Gabapentin Other (See Comments)    dizziness    Family History  Problem Relation Age of Onset  . Emphysema Father   . Asthma Father   . Osteoarthritis Father   . Heart attack Mother   . Uterine  cancer Mother   . Heart disease Mother   . ADD / ADHD Son   . Depression Son   . Hypertension Brother   . Heart disease Brother        Pacemaker   . Heart murmur Brother     Prior to Admission medications   Medication Sig Start Date End Date Taking? Authorizing Provider  albuterol (PROVENTIL HFA;VENTOLIN HFA) 108 (90 Base) MCG/ACT inhaler Inhale 2 puffs into the lungs every 4 (four) hours as needed for wheezing or shortness of breath. 07/30/17   Nyoka Cowden, MD  allopurinol (ZYLOPRIM) 100 MG tablet Take 100 mg by mouth daily.    [provider]  apixaban (ELIQUIS) 5 MG TABS tablet  Take 1 tablet (5 mg total) by mouth 2 (two) times daily. 09/04/17   Albertine Grates, MD  atorvastatin (LIPITOR) 40 MG tablet Take 1 tablet (40 mg total) by mouth daily. Patient taking differently: Take 40 mg by mouth every evening. 05/18/17 10/03/25  Lewayne Bunting, MD  budesonide (PULMICORT) 0.25 MG/2ML nebulizer solution Take 0.25 mg by nebulization 2 (two) times daily.    [provider]  buPROPion (ZYBAN) 150 MG 12 hr tablet Take 1 tablet (150 mg total) by mouth 2 (two) times daily. 01/09/20   Coralyn Helling, MD  CALCIUM-MAGNESIUM-VITAMIN D PO Take 1 tablet by mouth daily.    [provider]  Cholecalciferol (VITAMIN D-3) 125 MCG (5000 UT) TABS Take 5,000 Units by mouth daily. Patient not taking: Reported on 03/03/2020    [provider]  feeding supplement (ENSURE ENLIVE / ENSURE PLUS) LIQD Take 237 mLs by mouth 3 (three) times daily between meals. 03/15/20   Ghimire, Werner Lean, MD  folic acid (FOLVITE) 1 MG tablet Take 2 tablets (2 mg total) by mouth daily. 03/14/20   Ghimire, Werner Lean, MD  insulin aspart (NOVOLOG) 100 UNIT/ML injection 0-15 Units, Subcutaneous, 3 times daily with meals CBG < 70: Implement Hypoglycemia measures CBG 70 - 120: 0 units CBG 121 - 150: 2 units CBG 151 - 200: 3 units CBG 201 - 250: 5 units CBG 251 - 300: 8 units CBG 301 - 350: 11 units CBG 351 - 400: 15 units CBG > 400: call MD 03/14/20   Maretta Bees, MD  ipratropium-albuterol (DUONEB) 0.5-2.5 (3) MG/3ML SOLN Take 3 mLs by nebulization 3 (three) times daily.     [provider]  Magnesium 400 MG CAPS Take 400 mg by mouth at bedtime.     [provider]  metFORMIN (GLUCOPHAGE) 500 MG tablet Take 500 mg by mouth 2 (two) times daily. 05/09/19   [provider]  pantoprazole (PROTONIX) 40 MG tablet Take 40 mg by mouth daily.     [provider]  QUEtiapine (SEROQUEL) 25 MG tablet Take 1 tablet (25 mg total) by mouth at bedtime. 03/14/20   Ghimire, Werner Lean, MD   SYMBICORT 160-4.5 MCG/ACT inhaler Inhale 2 puffs into the lungs in the morning and at bedtime. 01/15/20   [provider]    Physical Exam: Constitutional: Moderately built and nourished. Vitals:   03/27/20 0133 03/27/20 0145 03/27/20 0250 03/27/20 0345  BP: 105/70 93/65 109/68 125/76  Pulse: (!) 103 (!) 105  100  Resp: 17 (!) 26  (!) 22  Temp:      TempSrc:      SpO2: 95% 95%  95%   Eyes: Anicteric no pallor. ENMT: No discharge from the ears eyes nose or mouth. Neck: No mass  felt.  No neck rigidity. Respiratory: No rhonchi or crepitations. Cardiovascular: S1-S2 heard. Abdomen: Soft nontender bowel sounds present. Musculoskeletal: No edema. Skin: No rash. Neurologic: Appears confused does not follow commands has some jerking movements of the right upper extremity at the time of my exam. Psychiatric: Appears confused.   Labs on Admission: I have personally reviewed following labs and imaging studies  CBC: Recent Labs  Lab 03/27/20 0047 03/27/20 0048  WBC 7.2  --   NEUTROABS 3.7  --   HGB 8.3* 8.5*  HCT 26.0* 25.0*  MCV 99.2  --   PLT 233  --    Basic Metabolic Panel: Recent Labs  Lab 03/27/20 0047 03/27/20 0048  NA 140 141  K 4.0 3.3*  CL 107 103  CO2 21*  --   GLUCOSE 109* 111*  BUN 22 21  CREATININE 1.69* 1.70*  CALCIUM 9.6  --   MG 1.3*  --    GFR: CrCl cannot be calculated (Unknown ideal weight.). Liver Function Tests: Recent Labs  Lab 03/27/20 0047  AST 23  ALT 20  ALKPHOS 88  BILITOT 0.6  PROT 6.0*  ALBUMIN 2.1*   No results for input(s): LIPASE, AMYLASE in the last 168 hours. Recent Labs  Lab 03/26/20 2233  AMMONIA 28   Coagulation Profile: No results for input(s): INR, PROTIME in the last 168 hours. Cardiac Enzymes: No results for input(s): CKTOTAL, CKMB, CKMBINDEX, TROPONINI in the last 168 hours. BNP (last 3 results) No results for input(s): PROBNP in the last 8760 hours. HbA1C: No results for input(s): HGBA1C in the  last 72 hours. CBG: No results for input(s): GLUCAP in the last 168 hours. Lipid Profile: No results for input(s): CHOL, HDL, LDLCALC, TRIG, CHOLHDL, LDLDIRECT in the last 72 hours. Thyroid Function Tests: No results for input(s): TSH, T4TOTAL, FREET4, T3FREE, THYROIDAB in the last 72 hours. Anemia Panel: No results for input(s): VITAMINB12, FOLATE, FERRITIN, TIBC, IRON, RETICCTPCT in the last 72 hours. Urine analysis:    Component Value Date/Time   COLORURINE AMBER (A) 03/02/2020 1143   APPEARANCEUR HAZY (A) 03/02/2020 1143   LABSPEC 1.014 03/02/2020 1143   PHURINE 5.0 03/02/2020 1143   GLUCOSEU NEGATIVE 03/02/2020 1143   HGBUR MODERATE (A) 03/02/2020 1143   BILIRUBINUR NEGATIVE 03/02/2020 1143   KETONESUR NEGATIVE 03/02/2020 1143   PROTEINUR NEGATIVE 03/02/2020 1143   UROBILINOGEN 0.2 03/28/2007 1330   NITRITE NEGATIVE 03/02/2020 1143   LEUKOCYTESUR NEGATIVE 03/02/2020 1143   Sepsis Labs: (procalcitonin:4,lacticidven:4) ) Recent Results (from the past 240 hour(s))  Resp Panel by RT-PCR (Flu A&B, Covid) Nasopharyngeal Swab     Status: Abnormal   Collection Time: 03/27/20  4:15 AM   Specimen: Nasopharyngeal Swab; Nasopharyngeal(NP) swabs in vial transport medium  Result Value Ref Range Status   SARS Coronavirus 2 by RT PCR POSITIVE (A) NEGATIVE Final    Comment: RESULT CALLED TO, READ BACK BY AND VERIFIED WITH: J DODD RN 03/27/20 0507 JDW (NOTE) SARS-CoV-2 target nucleic acids are DETECTED.  The SARS-CoV-2 RNA is generally detectable in upper respiratory specimens during the acute phase of infection. Positive results are indicative of the presence of the identified virus, but do not rule out bacterial infection or co-infection with other pathogens not detected by the test. Clinical correlation with patient history and other diagnostic information is necessary to determine patient infection status. The expected result is Negative.  Fact Sheet for  Patients: BloggerCourse.com  Fact Sheet for Healthcare Providers: SeriousBroker.it  This test is not yet approved  or cleared by the [place] and  has been authorized for detection and/or diagnosis of SARS-CoV-2 by FDA under an Emergency Use Authorization (EUA).  This EUA will remain in effect (meaning this test can be used)  for the duration of  the COVID-19 declaration under Section 564(b)(1) of the Act, 21 U.S.C. section 360bbb-3(b)(1), unless the authorization is terminated or revoked sooner.     Influenza A by PCR NEGATIVE NEGATIVE Final   Influenza B by PCR NEGATIVE NEGATIVE Final    Comment: (NOTE) The Xpert Xpress SARS-CoV-2/FLU/RSV plus assay is intended as an aid in the diagnosis of influenza from Nasopharyngeal swab specimens and should not be used as a sole basis for treatment. Nasal washings and aspirates are unacceptable for Xpert Xpress SARS-CoV-2/FLU/RSV testing.  Fact Sheet for Patients: BloggerCourse.com  Fact Sheet for Healthcare Providers: SeriousBroker.it  This test is not yet approved or cleared by the [place] FDA and has been authorized for detection and/or diagnosis of SARS-CoV-2 by FDA under an Emergency Use Authorization (EUA). This EUA will remain in effect (meaning this test can be used) for the duration of the COVID-19 declaration under Section 564(b)(1) of the Act, 21 U.S.C. section 360bbb-3(b)(1), unless the authorization is terminated or revoked.  Performed at [facility], [address] [place], [place] [address]      Radiological Exams on Admission: CT Head Wo Contrast  Result Date: 03/27/2020 CLINICAL DATA:  Seizure. EXAM: CT HEAD WITHOUT CONTRAST TECHNIQUE: Contiguous axial images were obtained from the base of the skull through the vertex without intravenous contrast. COMPARISON:  May 15, 2020 FINDINGS: Brain: There  is mild cerebral atrophy with widening of the extra-axial spaces and ventricular dilatation. There are areas of decreased attenuation within the white matter tracts of the supratentorial brain, consistent with microvascular disease changes. Vascular: No hyperdense vessel or unexpected calcification. Skull: Normal. Negative for fracture or focal lesion. Sinuses/Orbits: A 6 mm x 5 mm left maxillary sinus polyp versus mucous retention cyst is noted. Other: None. IMPRESSION: 1. Mild cerebral atrophy. 2. No acute intracranial abnormality. Electronically Signed   By: Aram Candela M.D.   On: 03/27/2020 02:50   DG Chest Port 1 View  Result Date: 03/26/2020 CLINICAL DATA:  Altered mental status EXAM: PORTABLE CHEST 1 VIEW COMPARISON:  03/17/2020 FINDINGS: Left shoulder replacement. Low lung volumes. No consolidation or effusion. Stable cardiomediastinal silhouette. No pneumothorax. IMPRESSION: Low lung volumes. Electronically Signed   By: Jasmine Pang M.D.   On: 03/26/2020 22:49    EKG: Independently reviewed.  Normal sinus rhythm with RBBB.  Assessment/Plan Principal Problem:   Hypothermia Active Problems:   COPD GOLD III    Rheumatoid arthritis (HCC)   History of pulmonary embolism   Essential hypertension   COVID-19 virus infection    1. Hypothermia with UTI concerning for possible developing sepsis presently on warming blankets.  Follow cultures continue empiric antibiotics continue hydration. 2. Abnormal jerking movements of the extremities for which I have consulted neurology.  Appreciate neurology consult.  Requested MRI brain and EEG. 3. Persistent Covid virus positive presently asymptomatic.  We will continue to monitor. 4. History of pulmonary embolism on Eliquis presently keeping n.p.o. since patient is encephalopathic.  We will keep patient on heparin.  Changed to apixaban when patient can take orally. 5. Acute encephalopathy likely from sepsis.  Will have to rule out seizures with  EEG being ordered follow MRI.  Presently n.p.o. we will get speech therapy evaluation. 6. Chronic kidney  disease and chronic anemia appears to be at baseline follow CBC. 7. History of rheumatoid arthritis receives Remicade as outpatient. 8. Diabetes mellitus type 2 we will keep patient on sliding scale coverage.  Since patient has septic patient on presentation will need inpatient status.   DVT prophylaxis: Heparin. Code Status: DNR.  Confirmed with patient's niece. Family Communication: Patient's niece.  Ms. Hermelinda Medicus. Disposition Plan: Back to facility when stable. Consults called: Neurology and speech therapy. Admission status: Inpatient.   Eduard Clos MD [facility] Pager [phone].  If 7PM-7AM, please contact night-coverage [email or web address] Password [facility]  03/27/2020, 5:24 AM

## 2020-03-27 NOTE — Procedures (Addendum)
Patient Name: Chad Duke  MRN: [id number]  Epilepsy Attending: Charlsie Quest  Referring Physician/Provider: Dr. Onalee Hua Date: 03/27/2020 Duration: 24.23 mins  Patient history: 81 old male with tremulous activity in the setting of hypothermia.  EEG to evaluate for seizures.  Level of alertness: Awake, asleep  AEDs during EEG study: None  Technical aspects: This EEG study was done with scalp electrodes positioned according to the 10-20 International system of electrode placement. Electrical activity was acquired at a sampling rate of 500Hz  and reviewed with a high frequency filter of 70Hz  and a low frequency filter of 1Hz . EEG data were recorded continuously and digitally stored.   Description: During awake state, no clear posterior dominant rhythm was seen.  Sleep was characterized by vertex waves, sleep spindles (12 to 14 Hz), maximal frontocentral region. EEG showed continuous generalized 3 to 6 Hz theta-delta slowing.Hyperventilation and photic stimulation were not performed.     ABNORMALITY -Continuous slow, generalized  IMPRESSION: This study is suggestive of moderate diffuse encephalopathy, nonspecific etiology. No seizures or epileptiform discharges were seen throughout the recording.  [name] 

## 2020-03-28 ENCOUNTER — Inpatient Hospital Stay (HOSPITAL_COMMUNITY): Payer: Medicare Other

## 2020-03-28 DIAGNOSIS — D849 Immunodeficiency, unspecified: Secondary | ICD-10-CM | POA: Diagnosis not present

## 2020-03-28 DIAGNOSIS — E162 Hypoglycemia, unspecified: Secondary | ICD-10-CM

## 2020-03-28 DIAGNOSIS — N179 Acute kidney failure, unspecified: Secondary | ICD-10-CM

## 2020-03-28 DIAGNOSIS — E876 Hypokalemia: Secondary | ICD-10-CM | POA: Diagnosis not present

## 2020-03-28 LAB — BLOOD GAS, ARTERIAL
Acid-Base Excess: 0.2 mmol/L (ref 0.0–2.0)
Bicarbonate: 24.3 mmol/L (ref 20.0–28.0)
Drawn by: 519031
FIO2: 21
O2 Saturation: 91.4 %
Patient temperature: 36.7
pCO2 arterial: 38.9 mmHg (ref 32.0–48.0)
pH, Arterial: 7.411 (ref 7.350–7.450)
pO2, Arterial: 61.1 mmHg — ABNORMAL LOW (ref 83.0–108.0)

## 2020-03-28 LAB — COMPREHENSIVE METABOLIC PANEL
ALT: 16 U/L (ref 0–44)
AST: 22 U/L (ref 15–41)
Albumin: 1.7 g/dL — ABNORMAL LOW (ref 3.5–5.0)
Alkaline Phosphatase: 68 U/L (ref 38–126)
Anion gap: 9 (ref 5–15)
BUN: 17 mg/dL (ref 8–23)
CO2: 22 mmol/L (ref 22–32)
Calcium: 8.7 mg/dL — ABNORMAL LOW (ref 8.9–10.3)
Chloride: 106 mmol/L (ref 98–111)
Creatinine, Ser: 1.42 mg/dL — ABNORMAL HIGH (ref 0.61–1.24)
GFR, Estimated: 52 mL/min — ABNORMAL LOW (ref >60)
Glucose, Bld: 86 mg/dL (ref 70–99)
Potassium: 3.1 mmol/L — ABNORMAL LOW (ref 3.5–5.1)
Sodium: 137 mmol/L (ref 135–145)
Total Bilirubin: 0.8 mg/dL (ref 0.3–1.2)
Total Protein: 5 g/dL — ABNORMAL LOW (ref 6.5–8.1)

## 2020-03-28 LAB — CBC WITH DIFFERENTIAL/PLATELET
Abs Immature Granulocytes: 0.02 10*3/uL (ref 0.00–0.07)
Basophils Absolute: 0 10*3/uL (ref 0.0–0.1)
Basophils Relative: 1 %
Eosinophils Absolute: 0.3 10*3/uL (ref 0.0–0.5)
Eosinophils Relative: 3 %
HCT: 26.7 % — ABNORMAL LOW (ref 39.0–52.0)
Hemoglobin: 8.3 g/dL — ABNORMAL LOW (ref 13.0–17.0)
Immature Granulocytes: 0 %
Lymphocytes Relative: 29 %
Lymphs Abs: 2.1 10*3/uL (ref 0.7–4.0)
MCH: 31 pg (ref 26.0–34.0)
MCHC: 31.1 g/dL (ref 30.0–36.0)
MCV: 99.6 fL (ref 80.0–100.0)
Monocytes Absolute: 0.9 10*3/uL (ref 0.1–1.0)
Monocytes Relative: 12 %
Neutro Abs: 4 10*3/uL (ref 1.7–7.7)
Neutrophils Relative %: 55 %
Platelets: 222 10*3/uL (ref 150–400)
RBC: 2.68 MIL/uL — ABNORMAL LOW (ref 4.22–5.81)
RDW: 19.4 % — ABNORMAL HIGH (ref 11.5–15.5)
WBC: 7.3 10*3/uL (ref 4.0–10.5)
nRBC: 0 % (ref 0.0–0.2)

## 2020-03-28 LAB — GLUCOSE, CAPILLARY
Glucose-Capillary: 109 mg/dL — ABNORMAL HIGH (ref 70–99)
Glucose-Capillary: 64 mg/dL — ABNORMAL LOW (ref 70–99)
Glucose-Capillary: 67 mg/dL — ABNORMAL LOW (ref 70–99)
Glucose-Capillary: 75 mg/dL (ref 70–99)
Glucose-Capillary: 78 mg/dL (ref 70–99)
Glucose-Capillary: 79 mg/dL (ref 70–99)
Glucose-Capillary: 84 mg/dL (ref 70–99)
Glucose-Capillary: 90 mg/dL (ref 70–99)

## 2020-03-28 LAB — MAGNESIUM: Magnesium: 1.4 mg/dL — ABNORMAL LOW (ref 1.7–2.4)

## 2020-03-28 LAB — URINE CULTURE

## 2020-03-28 LAB — D-DIMER, QUANTITATIVE: D-Dimer, Quant: 1.73 ug/mL-FEU — ABNORMAL HIGH (ref 0.00–0.50)

## 2020-03-28 LAB — C-REACTIVE PROTEIN: CRP: 4 mg/dL — ABNORMAL HIGH (ref <1.0)

## 2020-03-28 MED ORDER — DEXTROSE 50 % IV SOLN
1.0000 | Freq: Once | INTRAVENOUS | Status: AC
  Administered 2020-03-28: 50 mL via INTRAVENOUS
  Filled 2020-03-28: qty 50

## 2020-03-28 MED ORDER — POTASSIUM CHLORIDE 10 MEQ/100ML IV SOLN
10.0000 meq | INTRAVENOUS | Status: AC
  Administered 2020-03-28 (×6): 10 meq via INTRAVENOUS
  Filled 2020-03-28 (×6): qty 100

## 2020-03-28 MED ORDER — DEXTROSE-NACL 5-0.45 % IV SOLN: INTRAVENOUS | Status: DC

## 2020-03-28 MED ORDER — POTASSIUM CHLORIDE 20 MEQ PO PACK
40.0000 meq | PACK | Freq: Once | ORAL | Status: AC
  Administered 2020-03-28: 40 meq via ORAL
  Filled 2020-03-28: qty 2

## 2020-03-28 MED ORDER — SULFAMETHOXAZOLE-TRIMETHOPRIM 800-160 MG PO TABS
1.0000 | ORAL_TABLET | Freq: Two times a day (BID) | ORAL | Status: DC
  Administered 2020-03-28 – 2020-03-29 (×3): 1 via ORAL
  Filled 2020-03-28 (×3): qty 1

## 2020-03-28 MED ORDER — MAGNESIUM SULFATE 4 GM/100ML IV SOLN
4.0000 g | Freq: Once | INTRAVENOUS | Status: AC
  Administered 2020-03-28: 4 g via INTRAVENOUS
  Filled 2020-03-28: qty 100

## 2020-03-28 MED ORDER — VANCOMYCIN HCL 1250 MG/250ML IV SOLN
1250.0000 mg | INTRAVENOUS | Status: DC
  Administered 2020-03-28: 1250 mg via INTRAVENOUS
  Filled 2020-03-28: qty 250

## 2020-03-28 MED ORDER — DEXTROSE IN LACTATED RINGERS 5 % IV SOLN: INTRAVENOUS | Status: DC

## 2020-03-28 MED ORDER — SODIUM CHLORIDE 0.9 % IV SOLN
2.0000 g | Freq: Three times a day (TID) | INTRAVENOUS | Status: DC
  Administered 2020-03-28: 2 g via INTRAVENOUS
  Filled 2020-03-28: qty 2

## 2020-03-28 NOTE — Progress Notes (Signed)
Pt has had 3 # 22 IV's started 2/15,2/16,2/17. Pt to get Vancomycin & Cefepime - Pt needs a plan  for appropriate IV  access - possible PICC or central line.

## 2020-03-28 NOTE — Progress Notes (Signed)
Patient arrived to room 2w24 from ED.  Assessment complete, VS obtained, and Admission database began.

## 2020-03-28 NOTE — Treatment Plan (Cosign Needed)
Neurology Treatment Plan  - MRI brain imaging personally reviewed. MRI without acute findings but with chronic small vessel ischemia in the cerebral white matter and pons, and nonspecific brain atrophy. Motion degraded. - Dr. Candi Leash EEG interpretation reviewed " This study is suggestive of moderate diffuse encephalopathy, nonspecific etiology. No seizures of epileptiform discharges were seen throughout the recording." - No further neurology recommendations at this time.  - Neurology will be available for further questions or recommendations on an as-needed basis for Chad Duke.   Plan of care discussed with attending provider and he is in agreement [name], AGACNP-BC [facility] Pager: [phone]

## 2020-03-28 NOTE — Progress Notes (Signed)
Inpatient Diabetes Program Recommendations  AACE/ADA: New Consensus Statement on Inpatient Glycemic Control (2015)  Target Ranges:  Prepandial:   less than 140 mg/dL      Peak postprandial:   less than 180 mg/dL (1-2 hours)      Critically ill patients:  140 - 180 mg/dL   Lab Results  Component Value Date   GLUCAP 67 (L) 03/28/2020   HGBA1C 6.0 (H) 03/03/2020    Review of Glycemic Control Results for DEJUAN, ELMAN (MRN [id number]) as of 03/28/2020 09:11  Ref. Range 03/27/2020 23:30 03/28/2020 00:00 03/28/2020 04:38 03/28/2020 07:54  Glucose-Capillary Latest Ref Range: 70 - 99 mg/dL 69 (L) 90 79 67 (L)   Diabetes history: DM 2 Outpatient Diabetes medications:  Novolog 0-15 units tid with meals Metformin 500 mg bid Current orders for Inpatient glycemic control:  Novolog 0-9 units q 4 hours Inpatient Diabetes Program Recommendations:   Please reduce Novolog correction to tid with meals.   Thanks,  Beryl Meager, RN, BC-ADM Inpatient Diabetes Coordinator Pager [phone] (8a-5p)

## 2020-03-28 NOTE — Progress Notes (Signed)
RT NOTES:  ABG obtained and sent to lab. Lab tech Kim notified. 

## 2020-03-28 NOTE — Progress Notes (Signed)
Pharmacy Antibiotic Note  Chad Duke is a 75 y.o. male admitted on 03/26/2020 with possible sepsis.  Pharmacy has been consulted for Vancomycin and Cefepime dosing.     Plan: Change vancomycin 1250 mg IV q24>>AUC 463, scr 1.42 Change cefepime 2 g IV q8h Level if needed  Weight: 97.1 kg (214 lb)  Temp (24hrs), Avg:97.7 F (36.5 C), Min:97.3 F (36.3 C), Max:98.1 F (36.7 C)  Recent Labs  Lab 03/27/20 0047 03/27/20 0048 03/28/20 0041  WBC 7.2  --  7.3  CREATININE 1.69* 1.70* 1.42*  LATICACIDVEN 1.0  --   --     Estimated Creatinine Clearance: 50.8 mL/min (A) (by C-G formula based on SCr of 1.42 mg/dL (H)).    Allergies  Allergen Reactions  . Varenicline Other (See Comments)    Suicidal thoughts, Bad dreams - Per [facility]  . Gabapentin Other (See Comments)    Dizziness - Per [facility]    2/16 vanc>> 2/16 cefepime>> 2/16 flagyl>>  MRSA+ 2/16 urine>>poor sample 2/15 blood>>ngtd COVID+ has been pos since Jan  [name], PharmD, [place], AAHIVP, CPP Infectious Disease Pharmacist 03/28/2020 12:44 PM

## 2020-03-28 NOTE — Progress Notes (Addendum)
PROGRESS NOTE    Chad Duke  NOB:[id number] DOB: October 21, 1945 DOA: 03/26/2020 PCP: Soundra Pilon, FNP    Chief Complaint  Patient presents with  . Seizures    Brief Narrative:  Chad Duke is a 75 y.o. male with history of rheumatoid arthritis on Remicade, diabetes mellitus type 2, chronic kidney disease stage III, chronic anemia, recently diagnosed with Covid infection in January 2022 admitted recently for sepsis from parotitis and discharged on March 14, 2020 was found to have increasing abnormal movements of the extremities concerning for seizures over the last 24 hours and was brought to the ER.  Patient as per the patient's niece has become increasingly confused and not himself last few weeks.  Subjective:  Remain confused, does not follow command, I did not hear cough today, he is on room air, no hypoxia, hypothermia is improving No jerky movement today during exam,  He has hypoglycemia  Assessment & Plan:   Principal Problem:   Hypothermia Active Problems:   COPD GOLD III    Rheumatoid arthritis (HCC)   History of pulmonary embolism   Essential hypertension   COVID-19 virus infection  Possible early sepsis on presentation in an immunosuppressed individual, with hypothermia, sinus tachycardia, tachypnea, suspect from UTI, he has normal WBC and normal lactic acid Acute metabolic encephalopathy likely from sepsis present on admission, abnormal jerky movement of extremities,  MRI brain no acute findings but showed chronic small vessel ischemia in the cerebral white matter andpons, cerebral atrophy  EEG negative for seizure,  neurology consulted who think seizure is less likely, jerking movement likely from sepsis Blood culture no growth and urine culture with multiple species No diarrhea ABG no CO2 retention Chest x-ray no acute infiltrate Random a.m. cortisol 15.3 TSH 3.6 Clinically he is improving, no jerky movement today, hypothermia has improved, although  remain confused, will narrow antibiotic to cover possible UTI  Dysphagia -Speech recommended dysphagia diet to thin liquid  Hypokalemia/hypomagnesemia, remain low, continue to replace, recheck in the morning  AKI on CKD III -Baseline creatinine appear around 1.1, cr peak at 1.7 -Creatinine today at 1.4, continue hydration for another 24hrs -repeat bmp -Renal dosing meds  Anemia of chronic disease -Hemoglobin stable at baseline above eight  Diastolic CHF Does not appear to be on Lasix at home Appear dehydrated, on gentle hydration, close monitor volume status  COPD, home O2 dependent  History of DVT and PE on Eliquis As has left pedal edema on exam He was started on heparin drip due to altered mental status n.p.o. on admission Change to Lovenox, likely able to transition to Eliquis once mental status improves  Insulin-dependent type 2 diabetes, with hypoglycemia  On Metformin and sliding scale insulin at home Hold Metformin Continue sliding scale Hypoglycemia protocol, start d51/2saline,  am random cortisol level unremarkable  Rheumatoid arthritis/immunosuppressed status Reported on Remicade    He was diagnosed with Covid in January in SNF, was treated,  However repeat Covid screening test persistently positive Chest x-ray this admission no acute findings, he is on room air, there is no hypoxia CRP and D-dimer ordered on admission, I am not sure this will be reliable in the setting of acute infection Will monitor  FTT, frequent ED visits/hospitalizations since 01/2020, poor prognosis, niece agreed to talk to palliative care, order placed  MRSA screening positive, start decolonization, contact precaution   Skin Assessment: I have examined the patient's skin and I agree with the wound assessment as performed by the wound care RN as  outlined below: Pressure Injury 03/06/20 Heel Left Deep Tissue Pressure Injury - Purple or maroon localized area of discolored intact skin  or blood-filled blister due to damage of underlying soft tissue from pressure and/or shear. (Active)  03/06/20 1932  Location: Heel  Location Orientation: Left  Staging: Deep Tissue Pressure Injury - Purple or maroon localized area of discolored intact skin or blood-filled blister due to damage of underlying soft tissue from pressure and/or shear.  Wound Description (Comments):   Present on Admission:     Unresulted Labs (From admission, onward)          Start     Ordered   03/28/20 0500  CBC with Differential/Platelet  Daily,   R      03/27/20 0523   03/28/20 0500  Comprehensive metabolic panel  Daily,   R      03/27/20 0523   03/28/20 0500  C-reactive protein  Daily,   R      03/27/20 0523   03/28/20 0500  D-dimer, quantitative (not at [facility])  Daily,   R      03/27/20 0523   03/27/20 1852  Blood gas, arterial  Once,   R        03/27/20 1851   03/26/20 2232  CBC with Differential  Once,   STAT        03/26/20 2232            DVT prophylaxis: On therapeutic Lovenox   Code Status: DNR Family Communication: Niece over the phone Disposition:   Status is: Inpatient   Dispo: The patient is from: SNF              Anticipated d/c is to: SNF              Anticipated d/c date is: Not medically ready to discharge                Consultants:   Neurology  Procedures:   EEG  Antimicrobials:     Anti-infectives (From admission, onward)   Start     Dose/Rate Route Frequency Ordered Stop   03/29/20 1000  vancomycin (VANCOREADY) IVPB 1500 mg/300 mL  Status:  Discontinued        1,500 mg 150 mL/hr over 120 Minutes Intravenous Every 48 hours 03/27/20 0534 03/28/20 1245   03/28/20 2200  sulfamethoxazole-trimethoprim (BACTRIM DS) 800-160 MG per tablet 1 tablet        1 tablet Oral Every 12 hours 03/28/20 1753     03/28/20 1400  ceFEPIme (MAXIPIME) 2 g in sodium chloride 0.9 % 100 mL IVPB  Status:  Discontinued        2 g 200 mL/hr over 30 Minutes Intravenous Every 8 hours  03/28/20 1245 03/28/20 1753   03/28/20 1400  vancomycin (VANCOREADY) IVPB 1250 mg/250 mL  Status:  Discontinued        1,250 mg 166.7 mL/hr over 90 Minutes Intravenous Every 24 hours 03/28/20 1245 03/28/20 1753   03/28/20 1000  remdesivir 100 mg in sodium chloride 0.9 % 100 mL IVPB  Status:  Discontinued       "Followed by" Linked Group Details   100 mg 200 mL/hr over 30 Minutes Intravenous Daily 03/27/20 0523 03/27/20 0649   03/27/20 1800  ceFEPIme (MAXIPIME) 2 g in sodium chloride 0.9 % 100 mL IVPB  Status:  Discontinued        2 g 200 mL/hr over 30 Minutes Intravenous Every 12 hours 03/27/20 0534 03/28/20 1245  03/27/20 1200  metroNIDAZOLE (FLAGYL) IVPB 500 mg  Status:  Discontinued        500 mg 100 mL/hr over 60 Minutes Intravenous Every 8 hours 03/27/20 0652 03/28/20 1753   03/27/20 0715  remdesivir 200 mg in sodium chloride 0.9% 250 mL IVPB  Status:  Discontinued       "Followed by" Linked Group Details   200 mg 580 mL/hr over 30 Minutes Intravenous Once 03/27/20 0523 03/27/20 0649   03/27/20 0230  vancomycin (VANCOREADY) IVPB 1500 mg/300 mL        1,500 mg 150 mL/hr over 120 Minutes Intravenous  Once 03/27/20 0155 03/27/20 0740   03/27/20 0200  ceFEPIme (MAXIPIME) 2 g in sodium chloride 0.9 % 100 mL IVPB        2 g 200 mL/hr over 30 Minutes Intravenous  Once 03/27/20 0155 03/27/20 0358   03/27/20 0200  metroNIDAZOLE (FLAGYL) IVPB 500 mg        500 mg 100 mL/hr over 60 Minutes Intravenous  Once 03/27/20 0155 03/27/20 0456           Objective: Vitals:   03/27/20 1941 03/27/20 2332 03/28/20 0433 03/28/20 0752  BP: 124/84 113/73 127/89 117/67  Pulse: 91 99 92 95  Resp: Temp: (!) 97.3 F (36.3 C) 98 F (36.7 C) 97.9 F (36.6 C) 98.1 F (36.7 C)  TempSrc: Axillary Axillary Axillary Axillary  SpO2: 97% 99% 99% 97%  Weight:        Intake/Output Summary (Last 24 hours) at 03/28/2020 1610 Last data filed at 03/28/2020 0435 Gross per 24 hour  Intake 1599  ml  Output 750 ml  Net 849 ml   Filed Weights   03/27/20 0805  Weight: 97.1 kg    Examination:  General exam: Pleasantly confused, only oriented to self, does not follow command, no shivering , no cough, Respiratory system: Clear to auscultation. Respiratory effort normal. Cardiovascular system: S1 & S2 heard, RRR. No JVD, no murmur, No pedal edema. Gastrointestinal system: Abdomen is nondistended, soft and nontender. Normal bowel sounds heard. Central nervous system: Pleasantly confused, moving all extremities. Extremities: Left pedal pitting edema improved Skin: Chronic venous stasis change bilateral lower extremity Psychiatry: Pleasantly confused.     Data Reviewed: I have personally reviewed following labs and imaging studies  CBC: Recent Labs  Lab 03/27/20 0047 03/27/20 0048 03/28/20 0041  WBC 7.2  --  7.3  NEUTROABS 3.7  --  4.0  HGB 8.3* 8.5* 8.3*  HCT 26.0* 25.0* 26.7*  MCV 99.2  --  99.6  PLT 233  --  222    Basic Metabolic Panel: Recent Labs  Lab 03/27/20 0047 03/27/20 0048 03/28/20 0041  NA 140 141 137  K 4.0 3.3* 3.1*  CL 107 103 106  CO2 21*  --  22  GLUCOSE 109* 111* 86  BUN CREATININE 1.69* 1.70* 1.42*  CALCIUM 9.6  --  8.7*  MG 1.3*  --  1.4*    GFR: Estimated Creatinine Clearance: 50.8 mL/min (A) (by C-G formula based on SCr of 1.42 mg/dL (H)).  Liver Function Tests: Recent Labs  Lab 03/27/20 0047 03/28/20 0041  AST 23 22  ALT 20 16  ALKPHOS 88 68  BILITOT 0.6 0.8  PROT 6.0* 5.0*  ALBUMIN 2.1* 1.7*    CBG: Recent Labs  Lab 03/27/20 1940 03/27/20 2330 03/28/20 0000 03/28/20 0438 03/28/20 0754  GLUCAP 85 69* 90 79 67*  Recent Results (from the past 240 hour(s))  Culture, blood (routine x 2)     Status: None (Preliminary result)   Collection Time: 03/26/20 11:23 PM   Specimen: BLOOD RIGHT HAND  Result Value Ref Range Status   Specimen Description BLOOD RIGHT HAND  Final   Special Requests   Final     BOTTLES DRAWN AEROBIC AND ANAEROBIC Blood Culture results may not be optimal due to an inadequate volume of blood received in culture bottles   Culture   Final    NO GROWTH 1 DAY Performed at [facility], [address] [place], [place] [address]    Report Status PENDING  Incomplete  Culture, blood (routine x 2)     Status: None (Preliminary result)   Collection Time: 03/27/20 12:45 AM   Specimen: BLOOD  Result Value Ref Range Status   Specimen Description BLOOD RIGHT ARM  Final   Special Requests   Final    BOTTLES DRAWN AEROBIC AND ANAEROBIC Blood Culture results may not be optimal due to an excessive volume of blood received in culture bottles   Culture   Final    NO GROWTH 1 DAY Performed at [facility], [address] [place], [place] [address]    Report Status PENDING  Incomplete  Resp Panel by RT-PCR (Flu A&B, Covid) Nasopharyngeal Swab     Status: Abnormal   Collection Time: 03/27/20  4:15 AM   Specimen: Nasopharyngeal Swab; Nasopharyngeal(NP) swabs in vial transport medium  Result Value Ref Range Status   SARS Coronavirus 2 by RT PCR POSITIVE (A) NEGATIVE Final    Comment: RESULT CALLED TO, READ BACK BY AND VERIFIED WITH: J DODD RN 03/27/20 0507 JDW (NOTE) SARS-CoV-2 target nucleic acids are DETECTED.  The SARS-CoV-2 RNA is generally detectable in upper respiratory specimens during the acute phase of infection. Positive results are indicative of the presence of the identified virus, but do not rule out bacterial infection or co-infection with other pathogens not detected by the test. Clinical correlation with patient history and other diagnostic information is necessary to determine patient infection status. The expected result is Negative.  Fact Sheet for Patients: BloggerCourse.com  Fact Sheet for Healthcare Providers: SeriousBroker.it  This test is not yet approved or cleared by the [place] FDA  and  has been authorized for detection and/or diagnosis of SARS-CoV-2 by FDA under an Emergency Use Authorization (EUA).  This EUA will remain in effect (meaning this test can be used)  for the duration of  the COVID-19 declaration under Section 564(b)(1) of the Act, 21 U.S.C. section 360bbb-3(b)(1), unless the authorization is terminated or revoked sooner.     Influenza A by PCR NEGATIVE NEGATIVE Final   Influenza B by PCR NEGATIVE NEGATIVE Final    Comment: (NOTE) The Xpert Xpress SARS-CoV-2/FLU/RSV plus assay is intended as an aid in the diagnosis of influenza from Nasopharyngeal swab specimens and should not be used as a sole basis for treatment. Nasal washings and aspirates are unacceptable for Xpert Xpress SARS-CoV-2/FLU/RSV testing.  Fact Sheet for Patients: BloggerCourse.com  Fact Sheet for Healthcare Providers: SeriousBroker.it  This test is not yet approved or cleared by the [place] FDA and has been authorized for detection and/or diagnosis of SARS-CoV-2 by FDA under an Emergency Use Authorization (EUA). This EUA will remain in effect (meaning this test can be used) for the duration of the COVID-19 declaration under Section 564(b)(1) of the Act, 21 U.S.C. section 360bbb-3(b)(1), unless the authorization  is terminated or revoked.  Performed at [facility], [address], [place], [place] [address]   Urine culture     Status: Abnormal   Collection Time: 03/27/20  5:44 AM   Specimen: Urine, Random  Result Value Ref Range Status   Specimen Description URINE, RANDOM  Final   Special Requests   Final    NONE Performed at [facility], [address] [place], [place] [address]    Culture MULTIPLE SPECIES PRESENT, SUGGEST RECOLLECTION (A)  Final   Report Status 03/28/2020 FINAL  Final  MRSA PCR Screening     Status: Abnormal   Collection Time: 03/27/20 10:37 AM   Specimen: Nasal Mucosa;  Nasopharyngeal  Result Value Ref Range Status   MRSA by PCR POSITIVE (A) NEGATIVE Final    Comment:        The GeneXpert MRSA Assay (FDA approved for NASAL specimens only), is one component of a comprehensive MRSA colonization surveillance program. It is not intended to diagnose MRSA infection nor to guide or monitor treatment for MRSA infections. RESULT CALLED TO, READ BACK BY AND VERIFIED WITH: RN Tenna Child [id number] AT 1310 BY CM Performed at [facility], [address] [place], [place] [address]          Radiology Studies: EEG  Result Date: 03/27/2020 Charlsie Quest, MD     03/27/2020 10:55 AM Patient Name: Chad Duke MRN: [id number] Epilepsy Attending: Charlsie Quest Referring Physician/Provider: Dr. Onalee Hua Date: 03/27/2020 Duration: 24.23 mins Patient history: 22 old male with tremulous activity in the setting of hypothermia.  EEG to evaluate for seizures. Level of alertness: Awake, asleep AEDs during EEG study: None Technical aspects: This EEG study was done with scalp electrodes positioned according to the 10-20 International system of electrode placement. Electrical activity was acquired at a sampling rate of 500Hz  and reviewed with a high frequency filter of 70Hz  and a low frequency filter of 1Hz . EEG data were recorded continuously and digitally stored. Description: During awake state, no clear posterior dominant rhythm was seen.  Sleep was characterized by vertex waves, sleep spindles (12 to 14 Hz), maximal frontocentral region. EEG showed continuous generalized 3 to 6 Hz theta-delta slowing.Hyperventilation and photic stimulation were not performed.   ABNORMALITY -Continuous slow, generalized IMPRESSION: This study is suggestive of moderate diffuse encephalopathy, nonspecific etiology. No seizures or epileptiform discharges were seen throughout the recording. Charlsie Quest   CT Head Wo Contrast  Result Date: 03/27/2020 CLINICAL DATA:  Seizure. EXAM:  CT HEAD WITHOUT CONTRAST TECHNIQUE: Contiguous axial images were obtained from the base of the skull through the vertex without intravenous contrast. COMPARISON:  May 15, 2020 FINDINGS: Brain: There is mild cerebral atrophy with widening of the extra-axial spaces and ventricular dilatation. There are areas of decreased attenuation within the white matter tracts of the supratentorial brain, consistent with microvascular disease changes. Vascular: No hyperdense vessel or unexpected calcification. Skull: Normal. Negative for fracture or focal lesion. Sinuses/Orbits: A 6 mm x 5 mm left maxillary sinus polyp versus mucous retention cyst is noted. Other: None. IMPRESSION: 1. Mild cerebral atrophy. 2. No acute intracranial abnormality. Electronically Signed   By: Aram Candela M.D.   On: 03/27/2020 02:50   MR BRAIN WO CONTRAST  Result Date: 03/28/2020 CLINICAL DATA:  Neuro deficit with acute stroke suspected. Seizure and sepsis per prior imaging reports. EXAM: MRI HEAD WITHOUT CONTRAST TECHNIQUE: Multiplanar, multiecho pulse sequences of the brain and surrounding structures were  obtained without intravenous contrast. COM[facility]:  [facility] [place]  Brain MRI 01/19/2020 [id number] her management, and coordination of care.   Voice Recognition /Dragon dictation system was used to create this note, attempts have been made to correct errors. Please contact the author with questions and/or clarifications.   Shamiracle Gorden, MD PhD FACP [facility]  Available via Epic secure chat 7am-7pm for nonurgent issues Please page for urgent issues To page the attending provider between 7A-7P or the covering provider during after hours 7P-7A, please log into the web site [email or web address] and access using universal [place] password for that web site. If you do not have the password, please call the hospital operator.    03/28/2020, 8:08 AM

## 2020-03-28 NOTE — [place] FL2 (Signed)
[place] MEDICAID FL2 LEVEL OF CARE SCREENING TOOL     IDENTIFICATION  Patient Name: Chad Duke Birthdate: 05-Oct-1945 Sex: male Admission Date (Current Location): 03/26/2020  [facility] and [place] Number:  Producer, television/film/video and Address:  [facility] [place], [address], [place], [place] [address]      Provider Number: [id number]  Attending Physician Name and Address:  Albertine Grates, MD  Relative Name and Phone Number:  Trudie Buckler niece, [phone]    Current Level of Care: Hospital Recommended Level of Care: Skilled Nursing Facility Prior Approval Number:    Date Approved/Denied:   PASRR Number: [id number]  Discharge Plan: SNF    Current Diagnoses: Patient Active Problem List   Diagnosis Date Noted  . Hypothermia 03/27/2020  . COVID-19 virus infection 03/27/2020  . Severe sepsis (HCC) 03/02/2020  . Parotitis 03/02/2020  . Chronic renal disease, stage III (HCC) 01/25/2018  . Weakness 10/03/2017  . Adult failure to thrive 10/03/2017  . Positive D dimer 09/26/2017  . Elevated troponin 09/26/2017  . Personal history of DVT (deep vein thrombosis) 09/26/2017  . COPD with acute exacerbation (HCC) 09/26/2017  . CAD (coronary artery disease) 09/08/2017  . Goals of care, counseling/discussion   . Palliative care by specialist   . Acute kidney injury superimposed on chronic kidney disease (HCC) 09/02/2017  . Hypokalemia 09/02/2017  . Hypomagnesemia 09/02/2017  . Controlled type 2 diabetes mellitus with hyperglycemia (HCC) 09/02/2017  . Urinary incontinence 09/02/2017  . Fall 08/13/2017  . Acute renal failure superimposed on stage 3 chronic kidney disease (HCC) 08/13/2017  . HLD (hyperlipidemia) 08/13/2017  . Essential hypertension 08/13/2017  . GERD (gastroesophageal reflux disease) 08/13/2017  . Orthostatic hypotension 08/13/2017  . Deep vein thrombosis (DVT) of both lower extremities (HCC)   . Hyperglycemia   . History of pulmonary embolism  07/20/2017  . Acute on chronic diastolic heart failure (HCC) 07/20/2017  . Bilateral leg edema 07/20/2017  . Acute exacerbation of chronic obstructive pulmonary disease (COPD) (HCC) 07/19/2017  . 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 Cigarette smoker 09/08/2016  . Morbid obesity due to excess calories (HCC) 09/08/2016  . Pain in joint, ankle and foot 03/31/2012  . Abnormality of gait 03/31/2012  . Rheumatoid arthritis (HCC) 03/31/2012  . COPD GOLD III  02/12/2011  . Sleep apnea 02/12/2011    Orientation RESPIRATION BLADDER Height & Weight     Self  Normal Incontinent,External catheter Weight: 214 lb (97.1 kg) Height:     BEHAVIORAL SYMPTOMS/MOOD NEUROLOGICAL BOWEL NUTRITION STATUS      Incontinent Diet  AMBULATORY STATUS COMMUNICATION OF NEEDS Skin   Extensive Assist Verbally Other (Comment) (Left heel deep tissue pressure injury, non-pressure wound pre-tibial right)                       Personal Care Assistance Level of Assistance  Bathing,Dressing,Feeding Bathing Assistance: Maximum assistance Feeding assistance: Limited assistance Dressing Assistance: Maximum assistance     Functional Limitations Info  Sight,Hearing,Speech Sight Info: Adequate Hearing Info: Adequate Speech Info: Impaired    SPECIAL CARE FACTORS FREQUENCY  PT (By licensed PT),OT (By licensed OT)     PT Frequency: 5X per week OT Frequency: 5X per week            Contractures Contractures Info: Not present    Additional Factors Info  Code Status,Allergies,Isolation Precautions,Insulin Sliding Scale Code Status Info: DNR Allergies Info: Varenicline, Gabapentin   Insulin Sliding Scale Info: See DC summary  Isolation Precautions Info: MRSA     Current Medications (03/28/2020):  This is the current hospital active medication list Current Facility-Administered Medications  Medication Dose Route Frequency Provider Last Rate Last Admin  . acetaminophen (TYLENOL) tablet 650 mg  650 mg  Oral Q6H PRN Eduard Clos, MD       Or  . acetaminophen (TYLENOL) suppository 650 mg  650 mg Rectal Q6H PRN Eduard Clos, MD      . ceFEPIme (MAXIPIME) 2 g in sodium chloride 0.9 % 100 mL IVPB  2 g Intravenous Q12H Eduard Clos, MD 200 mL/hr at 03/28/20 0922 2 g at 03/28/20 0922  . Chlorhexidine Gluconate Cloth 2 % PADS 6 each  6 each Topical Q0600 Albertine Grates, MD   6 each at 03/28/20 [phone]  . dextrose 5 %-0.45 % sodium chloride infusion   Intravenous Continuous Albertine Grates, MD 50 mL/hr at 03/28/20 0931 New Bag at 03/28/20 0931  . enoxaparin (LOVENOX) injection 100 mg  100 mg Subcutaneous Q12H Pham, Minh Q, RPH-CPP   100 mg at 03/28/20 0920  . insulin aspart (novoLOG) injection 0-9 Units  0-9 Units Subcutaneous Q4H Eduard Clos, MD      . magnesium sulfate IVPB 4 g 100 mL  4 g Intravenous Once Albertine Grates, MD 50 mL/hr at 03/28/20 1059 4 g at 03/28/20 1059  . metroNIDAZOLE (FLAGYL) IVPB 500 mg  500 mg Intravenous Q8H Eduard Clos, MD 100 mL/hr at 03/28/20 0440 500 mg at 03/28/20 0440  . mupirocin ointment (BACTROBAN) 2 % 1 application  1 application Nasal BID Albertine Grates, MD   1 application at 03/28/20 [phone]  . potassium chloride 10 mEq in 100 mL IVPB  10 mEq Intravenous Q1 Hr x 6 Albertine Grates, MD 100 mL/hr at 03/28/20 1223 10 mEq at 03/28/20 1223  . [START ON 03/29/2020] vancomycin (VANCOREADY) IVPB 1500 mg/300 mL  1,500 mg Intravenous Q48H Eduard Clos, MD         Discharge Medications: Please see discharge summary for a list of discharge medications.  Relevant Imaging Results:  Relevant Lab Results:   Additional Information SSN: [id number]  [place] Pink, Student-Social Work

## 2020-03-29 DIAGNOSIS — R627 Adult failure to thrive: Secondary | ICD-10-CM | POA: Diagnosis not present

## 2020-03-29 DIAGNOSIS — D849 Immunodeficiency, unspecified: Secondary | ICD-10-CM | POA: Diagnosis not present

## 2020-03-29 DIAGNOSIS — Z7189 Other specified counseling: Secondary | ICD-10-CM | POA: Diagnosis not present

## 2020-03-29 DIAGNOSIS — Z515 Encounter for palliative care: Secondary | ICD-10-CM

## 2020-03-29 DIAGNOSIS — Z66 Do not resuscitate: Secondary | ICD-10-CM

## 2020-03-29 DIAGNOSIS — T68XXXA Hypothermia, initial encounter: Secondary | ICD-10-CM | POA: Diagnosis not present

## 2020-03-29 DIAGNOSIS — J449 Chronic obstructive pulmonary disease, unspecified: Secondary | ICD-10-CM | POA: Diagnosis not present

## 2020-03-29 LAB — GLUCOSE, CAPILLARY
Glucose-Capillary: 65 mg/dL — ABNORMAL LOW (ref 70–99)
Glucose-Capillary: 69 mg/dL — ABNORMAL LOW (ref 70–99)
Glucose-Capillary: 78 mg/dL (ref 70–99)
Glucose-Capillary: 79 mg/dL (ref 70–99)
Glucose-Capillary: 79 mg/dL (ref 70–99)
Glucose-Capillary: 84 mg/dL (ref 70–99)

## 2020-03-29 LAB — CBC WITH DIFFERENTIAL/PLATELET
Abs Immature Granulocytes: 0.03 10*3/uL (ref 0.00–0.07)
Basophils Absolute: 0 10*3/uL (ref 0.0–0.1)
Basophils Relative: 1 %
Eosinophils Absolute: 0.3 10*3/uL (ref 0.0–0.5)
Eosinophils Relative: 4 %
HCT: 26.2 % — ABNORMAL LOW (ref 39.0–52.0)
Hemoglobin: 8.2 g/dL — ABNORMAL LOW (ref 13.0–17.0)
Immature Granulocytes: 0 %
Lymphocytes Relative: 34 %
Lymphs Abs: 2.6 10*3/uL (ref 0.7–4.0)
MCH: 31.3 pg (ref 26.0–34.0)
MCHC: 31.3 g/dL (ref 30.0–36.0)
MCV: 100 fL (ref 80.0–100.0)
Monocytes Absolute: 1 10*3/uL (ref 0.1–1.0)
Monocytes Relative: 12 %
Neutro Abs: 3.7 10*3/uL (ref 1.7–7.7)
Neutrophils Relative %: 49 %
Platelets: 214 10*3/uL (ref 150–400)
RBC: 2.62 MIL/uL — ABNORMAL LOW (ref 4.22–5.81)
RDW: 19.4 % — ABNORMAL HIGH (ref 11.5–15.5)
WBC: 7.7 10*3/uL (ref 4.0–10.5)
nRBC: 0 % (ref 0.0–0.2)

## 2020-03-29 LAB — COMPREHENSIVE METABOLIC PANEL WITH GFR
ALT: 15 U/L (ref 0–44)
AST: 20 U/L (ref 15–41)
Albumin: 1.6 g/dL — ABNORMAL LOW (ref 3.5–5.0)
Alkaline Phosphatase: 70 U/L (ref 38–126)
Anion gap: 7 (ref 5–15)
BUN: 14 mg/dL (ref 8–23)
CO2: 23 mmol/L (ref 22–32)
Calcium: 8.5 mg/dL — ABNORMAL LOW (ref 8.9–10.3)
Chloride: 106 mmol/L (ref 98–111)
Creatinine, Ser: 1.43 mg/dL — ABNORMAL HIGH (ref 0.61–1.24)
GFR, Estimated: 51 mL/min — ABNORMAL LOW
Glucose, Bld: 98 mg/dL (ref 70–99)
Potassium: 3.5 mmol/L (ref 3.5–5.1)
Sodium: 136 mmol/L (ref 135–145)
Total Bilirubin: 0.5 mg/dL (ref 0.3–1.2)
Total Protein: 4.8 g/dL — ABNORMAL LOW (ref 6.5–8.1)

## 2020-03-29 LAB — C-REACTIVE PROTEIN: CRP: 4 mg/dL — ABNORMAL HIGH

## 2020-03-29 LAB — D-DIMER, QUANTITATIVE: D-Dimer, Quant: 1.66 ug/mL-FEU — ABNORMAL HIGH (ref 0.00–0.50)

## 2020-03-29 MED ORDER — DEXTROSE IN LACTATED RINGERS 5 % IV SOLN: INTRAVENOUS | Status: DC

## 2020-03-29 MED ORDER — ENSURE ENLIVE PO LIQD
237.0000 mL | Freq: Three times a day (TID) | ORAL | Status: DC
  Administered 2020-03-29 (×3): 237 mL via ORAL

## 2020-03-29 MED ORDER — LORAZEPAM 2 MG/ML IJ SOLN
0.5000 mg | INTRAMUSCULAR | Status: DC | PRN
  Administered 2020-03-29 (×2): 1 mg via INTRAVENOUS
  Filled 2020-03-29 (×2): qty 1

## 2020-03-29 MED ORDER — POLYVINYL ALCOHOL 1.4 % OP SOLN
1.0000 [drp] | Freq: Four times a day (QID) | OPHTHALMIC | Status: DC | PRN
  Filled 2020-03-29: qty 15

## 2020-03-29 MED ORDER — ONDANSETRON HCL 4 MG/2ML IJ SOLN: 4.0000 mg | Freq: Four times a day (QID) | INTRAMUSCULAR | Status: DC | PRN

## 2020-03-29 MED ORDER — BISACODYL 10 MG RE SUPP: 10.0000 mg | Freq: Every day | RECTAL | Status: DC | PRN

## 2020-03-29 MED ORDER — POTASSIUM CHLORIDE CRYS ER 20 MEQ PO TBCR
40.0000 meq | EXTENDED_RELEASE_TABLET | Freq: Once | ORAL | Status: AC
  Administered 2020-03-29: 40 meq via ORAL
  Filled 2020-03-29: qty 2

## 2020-03-29 MED ORDER — BUSPIRONE HCL 10 MG PO TABS: 5.0000 mg | ORAL_TABLET | Freq: Two times a day (BID) | ORAL | Status: DC

## 2020-03-29 MED ORDER — BIOTENE DRY MOUTH MT LIQD: 15.0000 mL | OROMUCOSAL | Status: DC | PRN

## 2020-03-29 MED ORDER — ADULT MULTIVITAMIN W/MINERALS CH
1.0000 | ORAL_TABLET | Freq: Every day | ORAL | Status: DC
  Administered 2020-03-29: 1 via ORAL
  Filled 2020-03-29: qty 1

## 2020-03-29 MED ORDER — ATROPINE SULFATE 1 % OP SOLN
2.0000 [drp] | Freq: Four times a day (QID) | OPHTHALMIC | Status: DC | PRN
  Filled 2020-03-29: qty 2

## 2020-03-29 MED ORDER — HYDROMORPHONE HCL 1 MG/ML IJ SOLN: 0.5000 mg | INTRAMUSCULAR | Status: DC | PRN

## 2020-03-29 NOTE — Progress Notes (Signed)
Inpatient Diabetes Program Recommendations  AACE/ADA: New Consensus Statement on Inpatient Glycemic Control (2015)  Target Ranges:  Prepandial:   less than 140 mg/dL      Peak postprandial:   less than 180 mg/dL (1-2 hours)      Critically ill patients:  140 - 180 mg/dL   Lab Results  Component Value Date   GLUCAP 65 (L) 03/29/2020   HGBA1C 6.0 (H) 03/03/2020    Review of Glycemic Control Results for DARRIL, PATRIARCA (MRN [id number]) as of 03/29/2020 09:52  Ref. Range 03/28/2020 07:54 03/28/2020 11:51 03/28/2020 17:13 03/28/2020 19:44 03/28/2020 21:17 03/28/2020 23:05 03/29/2020 03:22 03/29/2020 05:41 03/29/2020 08:22  Glucose-Capillary Latest Ref Range: 70 - 99 mg/dL 67 (L) 75 78 64 (L) 034 (H) 84 69 (L) 79 65 (L)   Diabetes history: DM 2 Outpatient Diabetes medications: Novolog 0-15 units tid, Metformin 500 mg bid Current orders for Inpatient glycemic control:  Novolog 0-9 units Q4 hours  Inpatient Diabetes Program Recommendations:    -  D/c Novolog Correction  Thanks,  Christena Deem RN, MSN, BC-ADM Inpatient Diabetes Coordinator Team Pager [phone] (8a-5p)

## 2020-03-29 NOTE — Progress Notes (Signed)
  Speech Language Pathology Treatment: Dysphagia  Patient Details Name: Chad Duke MRN: [id number] DOB: 04/18/1945 Today's Date: 03/29/2020 Time: 4008-6761 SLP Time Calculation (min) (ACUTE ONLY): 14 min  Assessment / Plan / Recommendation Clinical Impression  Pt was seen for dysphagia treatment during breakfast. Pt was confused with mittens in place. His responses were often unrelated to questions or the conversational topic. He responded to multiple questions with "Take the ______( eggs, grits, orange juice, etc.) off." He consumed a meal of grits, scrambled eggs, potatoes, ground meat, and thin liquids via straw. No s/x of aspiration were noted with solids or liquids. Mastication was intermittently prolonged with mild oral residue which was cleared with a liquid wash. Pt still appears safe for the current diet of dysphagia 2 solids and thin liquids. SLP will follow to assess improvement in swallow function.    HPI HPI: Pt is a 75 y.o. male with history of rheumatoid arthritis on Remicade, diabetes mellitus type 2, chronic kidney disease stage III, chronic anemia, recently diagnosed with COVID infection in January 2022 admitted recently for sepsis from parotitis and discharged on 02/12/20. Pt was found to have increasing abnormal movements of the extremities concerning for seizures 24 hours prior to admission and was brought to the ED.   CT head 2/16 revealed mild cerebral atrophybut was otherwise negative. CXR 2/15: No consolidation or  effusion. Pt was seen during last admission following diagnosis of anteriorly subluxed left mandible by ENT and metabolic encephalopathy. He was on a dysphagia 2 diet with thin liquids during the last admission. EEG 2/16: moderate diffuse encephalopathy, nonspecific etiology. No seizures or epileptiform discharges      SLP Plan  Continue with current plan of care       Recommendations  Diet recommendations: Dysphagia 2 (fine chop);Thin liquid Liquids provided  via: Cup;Straw Medication Administration: Whole meds with puree Supervision: Full supervision/cueing for compensatory strategies Compensations: Slow rate;Small sips/bites                Oral Care Recommendations: Oral care BID Follow up Recommendations: Skilled Nursing facility SLP Visit Diagnosis: Dysphagia, unspecified (R13.10) Plan: Continue with current plan of care       Chad Hussein I. Vear Clock, MS, CCC-SLP Acute Rehabilitation Services Office number [phone] Pager [phone]                Chad Duke 03/29/2020, 11:03 AM

## 2020-03-29 NOTE — Consult Note (Signed)
Palliative Medicine Inpatient Consult Note  Reason for consult:  Goals of Care  HPI:  Per intake H&P --> Chad Duke is a 75 y.o. male with history of rheumatoid arthritis on Remicade, diabetes mellitus type 2, chronic kidney disease stage III, chronic anemia, recently diagnosed with Covid infection in January 2022 admitted recently for sepsis from parotitis and discharged on March 14, 2020 was found to have increasing abnormal movements of the extremities concerning for seizures over the last 24 hours and was brought to the ER.   Palliative care was consulted on Chad Duke in the setting of ongoing decline, failure to thrive, and complicated delirium. Chad Duke had been seen by Palliative care in 2019.  Clinical Assessment/Goals of Care:  *Please note that this is a verbal dictation therefore any spelling or grammatical errors are due to the "Dragon [employer] One" system interpretation.  I have reviewed medical records including EPIC notes, labs and imaging, received report from bedside RN, assessed the patient who was in bed moving in a motion to exit. He had a sitter present at bedside and shares that she has been repositioning him all day.    I called Chad Duke niece, Chad Duke to further discuss diagnosis prognosis, GOC, EOL wishes, disposition and options.   I introduced Palliative Medicine as specialized medical care for people living with serious illness. It focuses on providing relief from the symptoms and stress of a serious illness. The goal is to improve quality of life for both the patient and the family.  Chad Duke shares with me that Chad Duke is originally from [place], [place]. He is not married. He has two sons, Chad Duke and Chad Duke though neither are involved in his life. Chad Duke has been his advocate for a few years now and is formally named as his HCPOA. 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 He ended up moving to [place] later  in life as he did not like the cold weather in [place]. He is a man of faith though is noted to be nondenominational.   Per our conversation, Chad Duke has had ongoing health concerns for sometime now though they have gotten far worse in the past two months. He has encountered recurrent hospitalizations. We reviewed how each hospitalization can cause worsening of his delirium which overall increases his mortality scores. When we consider this in additional to his multiple co-morbid conditions: rheumatoid arthritis, diabetes mellitus type 2, chronic kidney disease stage III, chronic anemia, and recent COVID-19 infection it is worrisome in terms of his overall prognosis.   Concepts specific to code status, artifical feeding and hydration, continued IV antibiotics and rehospitalization was had.  Patient is DNAR/DNI.  We reviewed how hard the past few years have been on Chad Duke and the declines that he has experienced we discussed changing our focus from aggressive measures to comfort focused care. We talked about transition to comfort measures in house and what that would entail inclusive of medications to control pain, dyspnea, agitation, nausea, itching, and hiccups.  We discussed stopping all uneccessary measures such as blood draws, Duke sticks, and frequent vital signs. We further talked about Chad Duke transitioning back to Chad Duke on hospice as this is a more familiar environment for him. She is in agreement with this.  Chad Duke shares that focusing on comfort is the best decision for Chad Duke - she shares that he no longer has much quality in his life. She vocalizes the importance of continuing to place his comfort  as a top priority.   Discussed the importance of continued conversation with family and their  medical providers regarding overall plan of care and treatment options, ensuring decisions are within the context of the patients values and GOCs.  Decision Maker: Chad Duke  (Niece) [phone]  SUMMARY OF RECOMMENDATIONS   DNAR/DNI  HCPOA documentation has been obtained and will be scanned into Vyn[employer]  Comfort focused care  Medication per [facility]  Unrestricted visitation  TOC - Plan to transition back to Chad Duke as early as tomorrow with hospice care  Code Status/Advance Care Planning: DNAR/DNI  Palliative Prophylaxis:   Oral care, Turn Q2H  Additional Recommendations (Limitations, Scope, Preferences):  Continue current scope of care with comfort focus   Psycho-social/Spiritual:   Desire for further Chaplaincy support: No  Additional Recommendations: Education on end of life care   Prognosis: Poor in the setting of recurrent readmission  Discharge Planning: Discharge back to Chad Duke with Hospice  Vitals:   03/29/20 0520 03/29/20 0830  BP: 115/78 104/89  Pulse: 87 83  Resp: 19 16  Temp: 98.1 F (36.7 C) 97.6 F (36.4 C)  SpO2: 94% 91%    Intake/Output Summary (Last 24 hours) at 03/29/2020 1634 Last data filed at 03/29/2020 0600 Gross per 24 hour  Intake 1064.74 ml  Output 1600 ml  Net -535.26 ml   Last Weight  Most recent update: 03/27/2020  8:45 AM   Weight  97.1 kg (214 lb)           Gen:  Elderlt Caucasian M  HEENT: Dry mucous membranes CV: Regular rate and rhythm  PULM: clear to auscultation bilaterally  ABD: soft/nontender  EXT: (+) generalized edema  Neuro: Disoriented  PPS: 10%   This conversation/these recommendations were discussed with patient primary care team, Dr. Roda Shutters  Time In: 1630 Time Out: 1740 Total Time: 70 Greater than 50%  of this time was spent counseling and coordinating care related to the above assessment and plan.  Chad Duke  Palliative Medicine Team Team Cell Phone: [phone] Please utilize secure chat with additional questions, if there is no response within 30 minutes please call the above phone number  Palliative Medicine Team providers are  available by phone from 7am to 7pm daily and can be reached through the team cell phone.  Should this patient require assistance outside of these hours, please call the patient's attending physician.

## 2020-03-29 NOTE — Progress Notes (Signed)
Initial Nutrition Assessment  DOCUMENTATION CODES:   Obesity unspecified  INTERVENTION:    Ensure Enlive po TID, each supplement provides 350 kcal and 20 grams of protein  MVI with minerals daily  NUTRITION DIAGNOSIS:   Inadequate oral intake related to lethargy/confusion,acute illness as evidenced by meal completion < 50%.  GOAL:   Patient will meet greater than or equal to 90% of their needs  MONITOR:   PO intake,Supplement acceptance,Skin  REASON FOR ASSESSMENT:   Malnutrition Screening Tool    ASSESSMENT:   75 yo male admitted with seizures, increasing confusion, hypothermia. PMH includes rheumatoid arthritis, DM-2, CKD-stage 3, chronic anemia, COVID Jan 2022, COPD, sleep apnea, HTN, GERD, bilateral hearing loss, vitamin B-12 deficiency, HLD, CHF, stasis dermatitis.   S/P bedside swallow evaluation with SLP 2/16. Currently on a dysphagia 2 diet with thin liquids. Meal intakes: 5-50% Last admission (03/03/20 - 03/15/20) patient ate poorly as well.   Blood sugar has been running on the low side since admission.  Weight history reviewed.  Usual weight 99.8 kg 01/19/20 Currently 97.1 kg 3% weight loss within 2 months is not severe.  Labs reviewed.  CBG: [id number]  Medications reviewed and include Novolog SSI. IVF: D5 LR at 75 ml/h  Diet Order:   Diet Order            DIET DYS 2 Room service appropriate? Yes with Assist; Fluid consistency: Thin  Diet effective now                 EDUCATION NEEDS:   No education needs have been identified at this time  Skin:  Skin Assessment: Skin Integrity Issues: Skin Integrity Issues:: DTI,Other (Comment) DTI: L heel Other: pretibial non pressure wound  Last BM:  2/17 type 4  Height:   Ht Readings from Last 1 Encounters:  03/02/20 5\' 8"  (1.727 m)    Weight:   Wt Readings from Last 1 Encounters:  03/27/20 97.1 kg    BMI:  Body mass index is 32.54 kg/m.  Estimated Nutritional Needs:   Kcal:   2000-2200  Protein:  110-125 gm  Fluid:  >/= 2 L    03/29/20, RD, LDN, CNSC Please refer to Amion for contact information.

## 2020-03-29 NOTE — Progress Notes (Addendum)
PROGRESS NOTE    Chad Duke  PYP:[id number] DOB: [birth year] DOA: 03/26/2020 PCP: Soundra Pilon, FNP    Chief Complaint  Patient presents with  . Seizures    Brief Narrative:  Chad Duke is a 75 y.o. male with history of rheumatoid arthritis on Remicade, diabetes mellitus type 2, chronic kidney disease stage III, chronic anemia, recently diagnosed with Covid infection in January 2022 admitted recently for sepsis from parotitis and discharged on March 14, 2020 was found to have increasing abnormal movements of the extremities concerning for seizures over the last 24 hours and was brought to the ER.  Patient as per the patient's niece has become increasingly confused and not himself last few weeks.  Subjective:  Remain confused, does not follow command, I did not hear cough today, he is on room air, no hypoxia, hypothermia is improving No jerky movement today during exam,  He has poor oral intake and  Hypoglycemia required d5 infusion   Assessment & Plan:   Principal Problem:   Hypothermia Active Problems:   COPD GOLD III    Rheumatoid arthritis (HCC)   History of pulmonary embolism   Essential hypertension   COVID-19 virus infection  Possible early sepsis on presentation in an immunosuppressed individual, with hypothermia, sinus tachycardia, tachypnea, suspect from UTI, he has normal WBC and normal lactic acid Acute metabolic encephalopathy likely from sepsis present on admission, abnormal jerky movement of extremities,  MRI brain no acute findings but showed chronic small vessel ischemia in the cerebral white matter andpons, cerebral atrophy  EEG negative for seizure,  neurology consulted who think seizure is less likely, jerking movement likely from sepsis Blood culture no growth and urine culture with multiple species No diarrhea ABG no CO2 retention Chest x-ray no acute infiltrate Random a.m. cortisol 15.3 TSH 3.6 Clinically he is improving, no jerky movement  today, hypothermia has improved, although remain confused, will narrow antibiotic to cover possible UTI  Dysphagia -Speech recommended dysphagia diet to thin liquid  Hypokalemia/hypomagnesemia, replaced, improved  AKI on CKD IIIa -Baseline creatinine appear around 1.1, cr peak at 1.7 -Received hydration ,creatinine today at 1.4   Anemia of chronic disease -Hemoglobin stable at baseline above eight  Diastolic CHF Does not appear to be on Lasix at home Appear dehydrated with poor oral intake, received gentle hydration  COPD, home O2 dependent by report, currently he is on room air no hypoxia.  History of DVT and PE on Eliquis As has left pedal edema on exam He was started on heparin drip due to altered mental status n.p.o. on admission Change to Lovenox  Insulin-dependent type 2 diabetes, with hypoglycemia  On Metformin and sliding scale insulin at home am random cortisol level unremarkable  Rheumatoid arthritis/immunosuppressed status Reported on Remicade    He was diagnosed with Covid in January in SNF, was treated,  However repeat Covid screening test persistently positive Chest x-ray this admission no acute findings, he is on room air, there is no hypoxia    FTT, frequent ED visits/hospitalizations since 01/2020, poor prognosis, niece agreed to talk to palliative care.  P.m. addendum: Family decided to transition to full comfort measures and return to SNF with hospice.  MRSA screening positive, start decolonization, contact precaution   Skin Assessment: I have examined the patient's skin and I agree with the wound assessment as performed by the wound care RN as outlined below: Pressure Injury 03/06/20 Heel Left Deep Tissue Pressure Injury - Purple or maroon localized area of discolored intact  skin or blood-filled blister due to damage of underlying soft tissue from pressure and/or shear. (Active)  03/06/20 1932  Location: Heel  Location Orientation: Left   Staging: Deep Tissue Pressure Injury - Purple or maroon localized area of discolored intact skin or blood-filled blister due to damage of underlying soft tissue from pressure and/or shear.  Wound Description (Comments):   Present on Admission:     Unresulted Labs (From admission, onward)          Start     Ordered   03/26/20 2232  CBC with Differential  Once,   STAT        03/26/20 2232            DVT prophylaxis: On therapeutic Lovenox   Code Status: DNR Family Communication: Niece over the phone on 2/16 and 2/17 Disposition:   Status is: Inpatient   Dispo: The patient is from: SNF              Anticipated d/c is to: SNF               Anticipated d/c date is: Pending palliative care consultation             P.m. addendum: Return to SNF with hospice likely tomorrow  Consultants:   Neurology  Palliative care  Procedures:   EEG  Antimicrobials:     Anti-infectives (From admission, onward)   Start     Dose/Rate Route Frequency Ordered Stop   03/29/20 1000  vancomycin (VANCOREADY) IVPB 1500 mg/300 mL  Status:  Discontinued        1,500 mg 150 mL/hr over 120 Minutes Intravenous Every 48 hours 03/27/20 0534 03/28/20 1245   03/28/20 2200  sulfamethoxazole-trimethoprim (BACTRIM DS) 800-160 MG per tablet 1 tablet        1 tablet Oral Every 12 hours 03/28/20 1753     03/28/20 1400  ceFEPIme (MAXIPIME) 2 g in sodium chloride 0.9 % 100 mL IVPB  Status:  Discontinued        2 g 200 mL/hr over 30 Minutes Intravenous Every 8 hours 03/28/20 1245 03/28/20 1753   03/28/20 1400  vancomycin (VANCOREADY) IVPB 1250 mg/250 mL  Status:  Discontinued        1,250 mg 166.7 mL/hr over 90 Minutes Intravenous Every 24 hours 03/28/20 1245 03/28/20 1753   03/28/20 1000  remdesivir 100 mg in sodium chloride 0.9 % 100 mL IVPB  Status:  Discontinued       "Followed by" Linked Group Details   100 mg 200 mL/hr over 30 Minutes Intravenous Daily 03/27/20 0523 03/27/20 0649   03/27/20 1800   ceFEPIme (MAXIPIME) 2 g in sodium chloride 0.9 % 100 mL IVPB  Status:  Discontinued        2 g 200 mL/hr over 30 Minutes Intravenous Every 12 hours 03/27/20 0534 03/28/20 1245   03/27/20 1200  metroNIDAZOLE (FLAGYL) IVPB 500 mg  Status:  Discontinued        500 mg 100 mL/hr over 60 Minutes Intravenous Every 8 hours 03/27/20 0652 03/28/20 1753   03/27/20 0715  remdesivir 200 mg in sodium chloride 0.9% 250 mL IVPB  Status:  Discontinued       "Followed by" Linked Group Details   200 mg 580 mL/hr over 30 Minutes Intravenous Once 03/27/20 0523 03/27/20 0649   03/27/20 0230  vancomycin (VANCOREADY) IVPB 1500 mg/300 mL        1,500 mg 150 mL/hr over 120 Minutes Intravenous  Once  03/27/20 0155 03/27/20 0740   03/27/20 0200  ceFEPIme (MAXIPIME) 2 g in sodium chloride 0.9 % 100 mL IVPB        2 g 200 mL/hr over 30 Minutes Intravenous  Once 03/27/20 0155 03/27/20 0358   03/27/20 0200  metroNIDAZOLE (FLAGYL) IVPB 500 mg        500 mg 100 mL/hr over 60 Minutes Intravenous  Once 03/27/20 0155 03/27/20 0456           Objective: Vitals:   03/28/20 1500 03/28/20 1938 03/29/20 0520 03/29/20 0830  BP: 99/67 107/81 115/78 104/89  Pulse: 89 94 87 83  Resp: Temp: 98.1 F (36.7 C) 98.2 F (36.8 C) 98.1 F (36.7 C) 97.6 F (36.4 C)  TempSrc: Axillary Axillary Axillary   SpO2: 96% 95% 94% 91%  Weight:        Intake/Output Summary (Last 24 hours) at 03/29/2020 1716 Last data filed at 03/29/2020 0600 Gross per 24 hour  Intake 1064.74 ml  Output 1600 ml  Net -535.26 ml   Filed Weights   03/27/20 0805  Weight: 97.1 kg    Examination:  General exam: Pleasantly confused, only oriented to self, does not follow command, no shivering , no cough, Respiratory system: Clear to auscultation. Respiratory effort normal. Cardiovascular system: S1 & S2 heard, RRR. No JVD, no murmur, No pedal edema. Gastrointestinal system: Abdomen is nondistended, soft and nontender. Normal bowel sounds  heard. Central nervous system: Pleasantly confused, moving all extremities. Extremities: Left pedal pitting edema improved Skin: Chronic venous stasis change bilateral lower extremity Psychiatry: Pleasantly confused.     Data Reviewed: I have personally reviewed following labs and imaging studies  CBC: Recent Labs  Lab 03/27/20 0047 03/27/20 0048 03/28/20 0041 03/29/20 0410  WBC 7.2  --  7.3 7.7  NEUTROABS 3.7  --  4.0 3.7  HGB 8.3* 8.5* 8.3* 8.2*  HCT 26.0* 25.0* 26.7* 26.2*  MCV 99.2  --  99.6 100.0  PLT 233  --  222 214    Basic Metabolic Panel: Recent Labs  Lab 03/27/20 0047 03/27/20 0048 03/28/20 0041 03/29/20 0410  NA 140 141 137 136  K 4.0 3.3* 3.1* 3.5  CL 107 103 106 106  CO2 21*  --  22 23  GLUCOSE 109* 111* 86 98  BUN CREATININE 1.69* 1.70* 1.42* 1.43*  CALCIUM 9.6  --  8.7* 8.5*  MG 1.3*  --  1.4*  --     GFR: Estimated Creatinine Clearance: 50.4 mL/min (A) (by C-G formula based on SCr of 1.43 mg/dL (H)).  Liver Function Tests: Recent Labs  Lab 03/27/20 0047 03/28/20 0041 03/29/20 0410  AST ALT ALKPHOS 88 68 70  BILITOT 0.6 0.8 0.5  PROT 6.0* 5.0* 4.8*  ALBUMIN 2.1* 1.7* 1.6*    CBG: Recent Labs  Lab 03/29/20 0322 03/29/20 0541 03/29/20 0822 03/29/20 1207 03/29/20 1620  GLUCAP 69* 79 65* 84 78     Recent Results (from the past 240 hour(s))  Culture, blood (routine x 2)     Status: None (Preliminary result)   Collection Time: 03/26/20 11:23 PM   Specimen: BLOOD RIGHT HAND  Result Value Ref Range Status   Specimen Description BLOOD RIGHT HAND  Final   Special Requests   Final    BOTTLES DRAWN AEROBIC AND ANAEROBIC Blood Culture results may not be optimal due to an inadequate volume of blood  received in culture bottles   Culture   Final    NO GROWTH 2 DAYS Performed at [facility], [address] [place], [place] [address]    Report Status PENDING  Incomplete  Culture, blood (routine  x 2)     Status: None (Preliminary result)   Collection Time: 03/27/20 12:45 AM   Specimen: BLOOD  Result Value Ref Range Status   Specimen Description BLOOD RIGHT ARM  Final   Special Requests   Final    BOTTLES DRAWN AEROBIC AND ANAEROBIC Blood Culture results may not be optimal due to an excessive volume of blood received in culture bottles   Culture   Final    NO GROWTH 2 DAYS Performed at [facility], [address], [place], [place] [address]    Report Status PENDING  Incomplete  Resp Panel by RT-PCR (Flu A&B, Covid) Nasopharyngeal Swab     Status: Abnormal   Collection Time: 03/27/20  4:15 AM   Specimen: Nasopharyngeal Swab; Nasopharyngeal(NP) swabs in vial transport medium  Result Value Ref Range Status   SARS Coronavirus 2 by RT PCR POSITIVE (A) NEGATIVE Final    Comment: RESULT CALLED TO, READ BACK BY AND VERIFIED WITH: J DODD RN 03/27/20 0507 JDW (NOTE) SARS-CoV-2 target nucleic acids are DETECTED.  The SARS-CoV-2 RNA is generally detectable in upper respiratory specimens during the acute phase of infection. Positive results are indicative of the presence of the identified virus, but do not rule out bacterial infection or co-infection with other pathogens not detected by the test. Clinical correlation with patient history and other diagnostic information is necessary to determine patient infection status. The expected result is Negative.  Fact Sheet for Patients: BloggerCourse.com  Fact Sheet for Healthcare Providers: SeriousBroker.it  This test is not yet approved or cleared by the [place] FDA and  has been authorized for detection and/or diagnosis of SARS-CoV-2 by FDA under an Emergency Use Authorization (EUA).  This EUA will remain in effect (meaning this test can be used)  for the duration of  the COVID-19 declaration under Section 564(b)(1) of the Act, 21 U.S.C. section 360bbb-3(b)(1), unless the  authorization is terminated or revoked sooner.     Influenza A by PCR NEGATIVE NEGATIVE Final   Influenza B by PCR NEGATIVE NEGATIVE Final    Comment: (NOTE) The Xpert Xpress SARS-CoV-2/FLU/RSV plus assay is intended as an aid in the diagnosis of influenza from Nasopharyngeal swab specimens and should not be used as a sole basis for treatment. Nasal washings and aspirates are unacceptable for Xpert Xpress SARS-CoV-2/FLU/RSV testing.  Fact Sheet for Patients: BloggerCourse.com  Fact Sheet for Healthcare Providers: SeriousBroker.it  This test is not yet approved or cleared by the [place] FDA and has been authorized for detection and/or diagnosis of SARS-CoV-2 by FDA under an Emergency Use Authorization (EUA). This EUA will remain in effect (meaning this test can be used) for the duration of the COVID-19 declaration under Section 564(b)(1) of the Act, 21 U.S.C. section 360bbb-3(b)(1), unless the authorization is terminated or revoked.  Performed at [facility], [address] [place], [place] [address]   Urine culture     Status: Abnormal   Collection Time: 03/27/20  5:44 AM   Specimen: Urine, Random  Result Value Ref Range Status   Specimen Description URINE, RANDOM  Final   Special Requests   Final    NONE Performed at [facility], [address], [place], [place]  [address]    Culture MULTIPLE SPECIES PRESENT, SUGGEST RECOLLECTION (A)  Final   Report Status 03/28/2020 FINAL  Final  MRSA PCR Screening     Status: Abnormal   Collection Time: 03/27/20 10:37 AM   Specimen: Nasal Mucosa; Nasopharyngeal  Result Value Ref Range Status   MRSA by PCR POSITIVE (A) NEGATIVE Final    Comment:        The GeneXpert MRSA Assay (FDA approved for NASAL specimens only), is one component of a comprehensive MRSA colonization surveillance program. It is not intended to diagnose MRSA infection nor to guide or monitor  treatment for MRSA infections. RESULT CALLED TO, READ BACK BY AND VERIFIED WITH: RN Tenna Child [id number] AT 1310 BY CM Performed at [facility], [address], [place], [place] [address]          Radiology Studies: MR BRAIN WO CONTRAST  Result Date: 03/28/2020 CLINICAL DATA:  Neuro deficit with acute stroke suspected. Seizure and sepsis per prior imaging reports. EXAM: MRI HEAD WITHOUT CONTRAST TECHNIQUE: Multiplanar, multiecho pulse sequences of the brain and surrounding structures were obtained without intravenous contrast. COMPARISON:  Head CT from yesterday.  Brain MRI 01/19/2020 FINDINGS: Brain: No acute infarction, hemorrhage, hydrocephalus, extra-axial collection or mass lesion. FLAIR hyperintensity in the pons and cerebral white matter especially around the lateral ventricles, compatible with chronic small vessel ischemia. Cerebral volume loss without specific pattern. Vascular: Preserved flow voids Skull and upper cervical spine: Normal marrow signal Sinuses/Orbits: Negative Other: Progressively motion degraded study. IMPRESSION: 1. No acute finding, including infarct. 2. Chronic small vessel ischemia in the cerebral white matter and pons. 3. Nonspecific brain atrophy. 4. Motion degraded. Electronically Signed   By: Marnee Spring M.D.   On: 03/28/2020 06:07        Scheduled Meds: . feeding supplement  237 mL Oral TID BM  . sulfamethoxazole-trimethoprim  1 tablet Oral Q12H   Continuous Infusions:    LOS: 2 days   Time spent: Greater than 50% of this time was spent in counseling, explanation of diagnosis, planning of further management, and coordination of care.   Voice Recognition Reubin [place] dictation system was used to create this note, attempts have been made to correct errors. Please contact the author with questions and/or clarifications.   Albertine Grates, MD PhD FACP [facility]  Available via Epic secure chat 7am-7pm for nonurgent issues Please page  for urgent issues To page the attending provider between 7A-7P or the covering provider during after hours 7P-7A, please log into the web site [email or web address] and access using universal [place] password for that web site. If you do not have the password, please call the hospital operator.    03/29/2020, 5:16 PM

## 2020-03-29 NOTE — Plan of Care (Signed)
Patient remains confused and aggitated. Sitter remains at bedside. Safety precautions maintained.

## 2020-03-29 NOTE — Plan of Care (Signed)

## 2020-03-30 DIAGNOSIS — J449 Chronic obstructive pulmonary disease, unspecified: Secondary | ICD-10-CM | POA: Diagnosis not present

## 2020-03-30 DIAGNOSIS — T68XXXA Hypothermia, initial encounter: Secondary | ICD-10-CM | POA: Diagnosis not present

## 2020-03-30 DIAGNOSIS — Z794 Long term (current) use of insulin: Secondary | ICD-10-CM

## 2020-03-30 DIAGNOSIS — E119 Type 2 diabetes mellitus without complications: Secondary | ICD-10-CM | POA: Diagnosis not present

## 2020-03-30 DIAGNOSIS — D849 Immunodeficiency, unspecified: Secondary | ICD-10-CM | POA: Diagnosis not present

## 2020-03-30 LAB — SARS CORONAVIRUS 2 (TAT 6-24 HRS): SARS Coronavirus 2: NEGATIVE

## 2020-03-30 MED ORDER — BIOTENE DRY MOUTH MT LIQD: 15.0000 mL | OROMUCOSAL | Status: AC | PRN

## 2020-03-30 MED ORDER — LORAZEPAM 0.5 MG PO TABS: 0.5000 mg | ORAL_TABLET | Freq: Four times a day (QID) | ORAL | 0 refills | Status: AC | PRN

## 2020-03-30 MED ORDER — OXYCODONE HCL 5 MG PO TABS: 5.0000 mg | ORAL_TABLET | ORAL | 0 refills | Status: AC | PRN

## 2020-03-30 MED ORDER — ACETAMINOPHEN 325 MG PO TABS: 650.0000 mg | ORAL_TABLET | Freq: Four times a day (QID) | ORAL | Status: AC | PRN

## 2020-03-30 MED ORDER — ATROPINE SULFATE 1 % OP SOLN: 2.0000 [drp] | Freq: Four times a day (QID) | OPHTHALMIC | 12 refills | Status: AC | PRN

## 2020-03-30 MED ORDER — SULFAMETHOXAZOLE-TRIMETHOPRIM 800-160 MG PO TABS: 1.0000 | ORAL_TABLET | Freq: Two times a day (BID) | ORAL | Status: AC

## 2020-03-30 MED ORDER — BISACODYL 10 MG RE SUPP: 10.0000 mg | Freq: Every day | RECTAL | 0 refills | Status: AC | PRN

## 2020-03-30 MED ORDER — POLYVINYL ALCOHOL 1.4 % OP SOLN: 1.0000 [drp] | Freq: Four times a day (QID) | OPHTHALMIC | 0 refills | Status: AC | PRN

## 2020-03-30 NOTE — Progress Notes (Signed)
Pt does not have a legal guardian. Pt has a niece who is POA.

## 2020-03-30 NOTE — Discharge Summary (Signed)
Discharge Summary  Chad Duke VOZ:[id number] DOB: Feb 01, 1946  PCP: Soundra Pilon, FNP  Admit date: 03/26/2020 Discharge date: 03/30/2020  Time spent: , more than 50% time spent on coordination of care.   Recommendations for Outpatient Follow-up:  1. Return to SNF with hospice care, full comfort measures     Discharge Diagnoses:  Active Hospital Problems   Diagnosis Date Noted  . Hypothermia 03/27/2020  . COVID-19 virus infection 03/27/2020  . Essential hypertension 08/13/2017  . History of pulmonary embolism 07/20/2017  . Rheumatoid arthritis (HCC) 03/31/2012  . COPD GOLD III  02/12/2011    Resolved Hospital Problems  No resolved problems to display.    Discharge Condition: stable  Diet recommendation: comfort feeds  Filed Weights   03/27/20 0805  Weight: 97.1 kg    History of present illness: ( per admitting MD Dr Toniann Fail) Patient coming from: Skilled nursing facility.  Chief Complaint: Abnormal jerking movements of the extremities.  History obtained from ER physician and patient's niece.  Previous records.  HPI: Chad Duke is a 75 y.o. male with history of rheumatoid arthritis on Remicade, diabetes mellitus type 2, chronic kidney disease stage III, chronic anemia, recently diagnosed with Covid infection in January 2022 admitted recently for sepsis from parotitis and discharged on March 14, 2020 was found to have increasing abnormal movements of the extremities concerning for seizures over the last 24 hours and was brought to the ER.  Patient as per the patient's niece has become increasingly confused and not himself last few weeks.  ED Course: In the ER patient appears confused CT head was unremarkable patient was hypothermic with temperature 95 F blood pressure systolic in the low normal's.  UA is concerning for UTI Covid test was positive and has been positive since January last month.  EKG shows normal sinus rhythm with RBBB.  Patient had  blood cultures drawn and started on empiric antibiotics since patient was hypothermic with UTI concerning for possible sepsis.  Neurology consulted for further evaluation of the abnormal movements.  Hospital Course:  Principal Problem:   Hypothermia Active Problems:   COPD GOLD III    Rheumatoid arthritis (HCC)   History of pulmonary embolism   Essential hypertension   COVID-19 virus infection   Possible early sepsis on presentation in an immunosuppressed individual, with hypothermia, sinus tachycardia, tachypnea, suspect from UTI, he has normal WBC and normal lactic acid Acute metabolic encephalopathy likely from sepsis present on admission, abnormal jerky movement of extremities,  MRI brain no acute findings but showed chronic small vessel ischemia in the cerebral white matter andpons, cerebral atrophy  EEG negative for seizure,  neurology consulted who think seizure is less likely, jerking movement likely from sepsis Blood culture no growth and urine culture with multiple species No diarrhea ABG no CO2 retention Chest x-ray no acute infiltrate Random a.m. cortisol 15.3 TSH 3.6 Clinically he is improving on broad spectrum iv abx with vanc/cefepime/flagyl, jerky movement has resolved hypothermia has improved, although remain confused, narrow antibiotic to cover possible UTI with bactrim to finish total of 5 days abx treatment -Family decided to transition to full comfort measures and return to SNF with hospice.  Dysphagia -Speech recommended dysphagia diet to thin liquid  Hypokalemia/hypomagnesemia, replaced, improved  AKI on CKD IIIa -Baseline creatinine appear around 1.1, cr peak at 1.7 -Received hydration ,creatinine improved to 1.4   Anemia of chronic disease -Hemoglobin stable at baseline above eight  Diastolic CHF Does not appear to be on Lasix at home  Appear dehydrated with poor oral intake, received gentle hydration  COPD, home O2 dependent by report,  currently he is on room air no hypoxia.  History of DVT and PE on Eliquis As has left pedal edema on exam He was started on heparin drip due to altered mental status n.p.o. on admission Changed to Lovenox, now on full comfort measures  Insulin-dependent type 2 diabetes, with hypoglycemia  On Metformin and sliding scale insulin at home am random cortisol level unremarkable  Rheumatoid arthritis/immunosuppressed status Reported on Remicade    He was diagnosed with Covid in January in SNF, was treated,  However repeat Covid screening test persistently positive Chest x-ray this admission no acute findings, he is on room air, there is no hypoxia  MRSA screening positive, start decolonization, contact precaution  FTT, frequent ED visits/hospitalizations since 01/2020, poor prognosis, niece agreed to talk to palliative care. Family decided to transition to full comfort measures and return to SNF with hospice.     Skin Assessment: I have examined the patient's skin and I agree with the wound assessment as performed by the wound care RN as outlined below: Pressure Injury 03/06/20 Heel Left Deep Tissue Pressure Injury - Purple or maroon localized area of discolored intact skin or blood-filled blister due to damage of underlying soft tissue from pressure and/or shear. (Active)  03/06/20 1932  Location: Heel  Location Orientation: Left  Staging: Deep Tissue Pressure Injury - Purple or maroon localized area of discolored intact skin or blood-filled blister due to damage of underlying soft tissue from pressure and/or shear.  Wound Description (Comments):   Present on Admission:     Code Status: DNR Family Communication: Niece over the phone on 2/16 and 2/17 Disposition: SNF with hospice , on full comfort measures   Consultants:   Neurology  Palliative care  Procedures:   EEG  Antimicrobials:   Anti-infectives (From admission, onward)   Start     Dose/Rate  Route Frequency Ordered Stop   03/30/20 0000  sulfamethoxazole-trimethoprim (BACTRIM DS) 800-160 MG tablet        1 tablet Oral Every 12 hours 03/30/20 0842 03/31/20 2359   03/29/20 1000  vancomycin (VANCOREADY) IVPB 1500 mg/300 mL  Status:  Discontinued        1,500 mg 150 mL/hr over 120 Minutes Intravenous Every 48 hours 03/27/20 0534 03/28/20 1245   03/28/20 2200  sulfamethoxazole-trimethoprim (BACTRIM DS) 800-160 MG per tablet 1 tablet        1 tablet Oral Every 12 hours 03/28/20 1753     03/28/20 1400  ceFEPIme (MAXIPIME) 2 g in sodium chloride 0.9 % 100 mL IVPB  Status:  Discontinued        2 g 200 mL/hr over 30 Minutes Intravenous Every 8 hours 03/28/20 1245 03/28/20 1753   03/28/20 1400  vancomycin (VANCOREADY) IVPB 1250 mg/250 mL  Status:  Discontinued        1,250 mg 166.7 mL/hr over 90 Minutes Intravenous Every 24 hours 03/28/20 1245 03/28/20 1753   03/28/20 1000  remdesivir 100 mg in sodium chloride 0.9 % 100 mL IVPB  Status:  Discontinued       "Followed by" Linked Group Details   100 mg 200 mL/hr over 30 Minutes Intravenous Daily 03/27/20 0523 03/27/20 0649   03/27/20 1800  ceFEPIme (MAXIPIME) 2 g in sodium chloride 0.9 % 100 mL IVPB  Status:  Discontinued        2 g 200 mL/hr over 30 Minutes Intravenous Every 12 hours  03/27/20 0534 03/28/20 1245   03/27/20 1200  metroNIDAZOLE (FLAGYL) IVPB 500 mg  Status:  Discontinued        500 mg 100 mL/hr over 60 Minutes Intravenous Every 8 hours 03/27/20 0652 03/28/20 1753   03/27/20 0715  remdesivir 200 mg in sodium chloride 0.9% 250 mL IVPB  Status:  Discontinued       "Followed by" Linked Group Details   200 mg 580 mL/hr over 30 Minutes Intravenous Once 03/27/20 0523 03/27/20 0649   03/27/20 0230  vancomycin (VANCOREADY) IVPB 1500 mg/300 mL        1,500 mg 150 mL/hr over 120 Minutes Intravenous  Once 03/27/20 0155 03/27/20 0740   03/27/20 0200  ceFEPIme (MAXIPIME) 2 g in sodium chloride 0.9 % 100 mL IVPB        2 g 200 mL/hr  over 30 Minutes Intravenous  Once 03/27/20 0155 03/27/20 0358   03/27/20 0200  metroNIDAZOLE (FLAGYL) IVPB 500 mg        500 mg 100 mL/hr over 60 Minutes Intravenous  Once 03/27/20 0155 03/27/20 0456       Discharge Exam: BP 125/73 (BP Location: Right Arm)   Pulse 85   Temp (!) 97.5 F (36.4 C) (Oral)   Resp 15   Wt 97.1 kg   SpO2 95%   BMI 32.54 kg/m   General: pleasantly confused, does not follow commands Cardiovascular: RRR Respiratory: normal respiratory effort  Discharge Instructions You were cared for by a hospitalist during your hospital stay. If you have any questions about your discharge medications or the care you received while you were in the hospital after you are discharged, you can call the unit and asked to speak with the hospitalist on call if the hospitalist that took care of you is not available. Once you are discharged, your primary care physician will handle any further medical issues. Please note that NO REFILLS for any discharge medications will be authorized once you are discharged, as it is imperative that you return to your primary care physician (or establish a relationship with a primary care physician if you do not have one) for your aftercare needs so that they can reassess your need for medications and monitor your lab values.  Discharge Instructions    Diet general   Complete by: As directed    Discharge wound care:   Complete by: As directed    Pressure offloading, skin care qshift   Increase activity slowly   Complete by: As directed      Allergies as of 03/30/2020      Reactions   Varenicline Other (See Comments)   Suicidal thoughts, Bad dreams - Per MAR   Gabapentin Other (See Comments)   Dizziness - Per MAR      Medication List    STOP taking these medications   allopurinol 100 MG tablet Commonly known as: ZYLOPRIM   amoxicillin-clavulanate 875-125 MG tablet Commonly known as: AUGMENTIN   apixaban 5 MG Tabs tablet Commonly  known as: Eliquis   atorvastatin 40 MG tablet Commonly known as: LIPITOR   buPROPion 150 MG 12 hr tablet Commonly known as: ZYBAN   CALCIUM-MAGNESIUM-VITAMIN D PO   feeding supplement Liqd   folic acid 1 MG tablet Commonly known as: FOLVITE   hydrOXYzine 25 MG tablet Commonly known as: ATARAX/VISTARIL   insulin aspart 100 UNIT/ML injection Commonly known as: novoLOG   Magnesium 400 MG Caps   metFORMIN 500 MG tablet Commonly known as: GLUCOPHAGE  pantoprazole 40 MG tablet Commonly known as: PROTONIX   Vitamin D-3 125 MCG (5000 UT) Tabs     TAKE these medications   acetaminophen 325 MG tablet Commonly known as: TYLENOL Take 2 tablets (650 mg total) by mouth every 6 (six) hours as needed for mild pain (or Fever >/= 101).   albuterol 108 (90 Base) MCG/ACT inhaler Commonly known as: VENTOLIN HFA Inhale 2 puffs into the lungs every 4 (four) hours as needed for wheezing or shortness of breath.   antiseptic oral rinse Liqd Apply 15 mLs topically as needed for dry mouth.   atropine 1 % ophthalmic solution Place 2 drops under the tongue 4 (four) times daily as needed (secretions).   bisacodyl 10 MG suppository Commonly known as: DULCOLAX Place 1 suppository (10 mg total) rectally daily as needed (constipation).   budesonide 0.25 MG/2ML nebulizer solution Commonly known as: PULMICORT Take 0.25 mg by nebulization 2 (two) times daily.   budesonide-formoterol 160-4.5 MCG/ACT inhaler Commonly known as: SYMBICORT Inhale 2 puffs into the lungs 2 (two) times daily. What changed: Another medication with the same name was removed. Continue taking this medication, and follow the directions you see here.   ipratropium-albuterol 0.5-2.5 (3) MG/3ML Soln Commonly known as: DUONEB Take 3 mLs by nebulization 3 (three) times daily.   LORazepam 0.5 MG tablet Commonly known as: ATIVAN Take 1 tablet (0.5 mg total) by mouth every 6 (six) hours as needed for anxiety or sleep  (agitation, restlessness). What changed: reasons to take this   oxyCODONE 5 MG immediate release tablet Commonly known as: Roxicodone Take 1 tablet (5 mg total) by mouth every 4 (four) hours as needed for severe pain.   polyvinyl alcohol 1.4 % ophthalmic solution Commonly known as: LIQUIFILM TEARS Place 1 drop into both eyes 4 (four) times daily as needed for dry eyes.   QUEtiapine 25 MG tablet Commonly known as: SEROQUEL Take 1 tablet (25 mg total) by mouth at bedtime. What changed: how much to take   sulfamethoxazole-trimethoprim 800-160 MG tablet Commonly known as: BACTRIM DS Take 1 tablet by mouth every 12 (twelve) hours for 1 day.            Discharge Care Instructions  (From admission, onward)         Start     Ordered   03/30/20 0000  Discharge wound care:       Comments: Pressure offloading, skin care qshift   03/30/20 0839         Allergies  Allergen Reactions  . Varenicline Other (See Comments)    Suicidal thoughts, Bad dreams - Per MAR  . Gabapentin Other (See Comments)    Dizziness - Per MAR      The results of significant diagnostics from this hospitalization (including imaging, microbiology, ancillary and laboratory) are listed below for reference.    Significant Diagnostic Studies: EEG  Result Date: 03/27/2020 Charlsie Quest, MD     03/27/2020 10:55 AM Patient Name: Chad Duke MRN: [id number] Epilepsy Attending: Charlsie Quest Referring Physician/Provider: Dr. Onalee Hua Date: 03/27/2020 Duration: 24.23 mins Patient history: 17 old male with tremulous activity in the setting of hypothermia.  EEG to evaluate for seizures. Level of alertness: Awake, asleep AEDs during EEG study: None Technical aspects: This EEG study was done with scalp electrodes positioned according to the 10-20 International system of electrode placement. Electrical activity was acquired at a sampling rate of 500Hz  and reviewed with a high frequency filter of 70Hz  and  a low frequency filter of 1Hz . EEG data were recorded continuously and digitally stored. Description: During awake state, no clear posterior dominant rhythm was seen.  Sleep was characterized by vertex waves, sleep spindles (12 to 14 Hz), maximal frontocentral region. EEG showed continuous generalized 3 to 6 Hz theta-delta slowing.Hyperventilation and photic stimulation were not performed.   ABNORMALITY -Continuous slow, generalized IMPRESSION: This study is suggestive of moderate diffuse encephalopathy, nonspecific etiology. No seizures or epileptiform discharges were seen throughout the recording.   DG Chest 1 View  Result Date: 03/17/2020 CLINICAL DATA:  Pain status post fall EXAM: CHEST  1 VIEW COMPARISON:  03/16/2020 FINDINGS: The patient has undergone prior total shoulder arthroplasty on the left. The heart size is stable. There is no pneumothorax or large pleural effusion. There are old healed right-sided rib fractures. No definite acute displaced fracture identified on today's study. There is stable blunting of both costophrenic angles favored to be secondary to trace bilateral pleural effusions and atelectasis. IMPRESSION: 1. No acute cardiopulmonary process. 2. Stable blunting of both costophrenic angles favored to be secondary to trace bilateral pleural effusions and atelectasis. Electronically Signed   By: 05/14/2020 M.D.   On: 03/17/2020 21:07   DG Chest 2 View  Result Date: 03/16/2020 CLINICAL DATA:  Fall. EXAM: CHEST - 2 VIEW COMPARISON:  Two days ago FINDINGS: Interstitial coarsening which is chronic. Aeration is improved from recent study. Trace pleural fluid or scarring at the lateral left costophrenic sulcus. Normal heart size. Aortic tortuosity. Left glenohumeral arthroplasty. No visible acute osseous finding. IMPRESSION: No acute finding when compared to prior. Electronically Signed   By: 03-07-1970 M.D.   On: 03/16/2020 10:00   DG Pelvis 1-2 Views  Result  Date: 03/17/2020 CLINICAL DATA:  Pain status post fall EXAM: PELVIS - 1-2 VIEW COMPARISON:  03/16/2020 FINDINGS: The patient has undergone total hip arthroplasty on the left. The hardware appears grossly intact where visualized. There are degenerative changes of the right hip. No definite acute displaced fracture or dislocation. Vascular calcifications are noted. IMPRESSION: 1. No definite acute displaced fracture or dislocation. 2. Status post total hip arthroplasty on the left. Electronically Signed   By: 05/14/2020 M.D.   On: 03/17/2020 21:06   DG Pelvis 1-2 Views  Result Date: 03/16/2020 CLINICAL DATA:  Is fall, pelvic pain EXAM: PELVIS - 1-2 VIEW COMPARISON:  CT of the abdomen and pelvis from February 28, 2018 FINDINGS: Degenerative changes about the RIGHT hip. Postop changes of LEFT total hip arthroplasty. No sign of fracture or dislocation on AP view. IMPRESSION: Postop changes of LEFT total hip arthroplasty without acute finding on AP view. Electronically Signed   By: March 02, 2018 M.D.   On: 03/16/2020 10:02   DG Elbow Complete Left  Result Date: 03/17/2020 CLINICAL DATA:  Pain status post fall EXAM: LEFT ELBOW - COMPLETE 3+ VIEW COMPARISON:  None. FINDINGS: Evaluation is limited by suboptimal lateral view. Given this limitation, no acute osseous abnormality was detected. There is soft tissue swelling about the posterior aspect the elbow. IMPRESSION: 1. No acute osseous abnormality detected on this limited study. 2. Soft tissue swelling about the posterior aspect of the elbow. Electronically Signed   By: 05/15/2020 M.D.   On: 03/17/2020 21:09   DG Forearm Right  Result Date: 03/17/2020 CLINICAL DATA:  Pain status post fall EXAM: RIGHT FOREARM - 2 VIEW COMPARISON:  None. FINDINGS: There are advanced degenerative changes of the patient's wrist. There is  no acute displaced fracture or dislocation. No significant surrounding soft tissue swelling. IMPRESSION: 1. No acute displaced  fracture or dislocation. 2. Advanced degenerative changes of the patient's wrist. Electronically Signed   By: Katherine Mantle M.D.   On: 03/17/2020 21:08   CT Head Wo Contrast  Result Date: 03/27/2020 CLINICAL DATA:  Seizure. EXAM: CT HEAD WITHOUT CONTRAST TECHNIQUE: Contiguous axial images were obtained from the base of the skull through the vertex without intravenous contrast. COMPARISON:  May 15, 2020 FINDINGS: Brain: There is mild cerebral atrophy with widening of the extra-axial spaces and ventricular dilatation. There are areas of decreased attenuation within the white matter tracts of the supratentorial brain, consistent with microvascular disease changes. Vascular: No hyperdense vessel or unexpected calcification. Skull: Normal. Negative for fracture or focal lesion. Sinuses/Orbits: A 6 mm x 5 mm left maxillary sinus polyp versus mucous retention cyst is noted. Other: None. IMPRESSION: 1. Mild cerebral atrophy. 2. No acute intracranial abnormality. Electronically Signed   By: Aram Candela M.D.   On: 03/27/2020 02:50   CT Head Wo Contrast  Result Date: 03/17/2020 CLINICAL DATA:  Altered mental status, fall earlier today. EXAM: CT HEAD WITHOUT CONTRAST TECHNIQUE: Contiguous axial images were obtained from the base of the skull through the vertex without intravenous contrast. COMPARISON:  Multiple priors including head CT March 16, 2020 FINDINGS: Brain: Unchanged global parenchymal volume loss with ex vacuo dilatation of ventricular system. Similar chronic small vessel ischemic white matter changes. The no evidence of acute infarction, hemorrhage, hydrocephalus, extra-axial collection or mass lesion/mass effect. Vascular: No hyperdense vessel. Atherosclerotic vascular calcifications. Skull: Normal. Negative for fracture or focal lesion. Sinuses/Orbits: No acute finding. Other: None. IMPRESSION: 1. No acute intracranial findings. 2. Unchanged global parenchymal volume loss and chronic small  vessel ischemic white matter changes. Electronically Signed   By: Maudry Mayhew MD   On: 03/17/2020 21:03   CT Head Wo Contrast  Result Date: 03/16/2020 CLINICAL DATA:  Trauma to the head and face. EXAM: CT HEAD WITHOUT CONTRAST TECHNIQUE: Contiguous axial images were obtained from the base of the skull through the vertex without intravenous contrast. COMPARISON:  03/02/2020 FINDINGS: Brain: Generalized atrophy. Chronic small-vessel ischemic changes of the white matter. No sign of acute infarction, mass lesion, hemorrhage, hydrocephalus or extra-axial collection. Vascular: There is atherosclerotic calcification of the major vessels at the base of the brain. Skull: No skull fracture. Left temporomandibular joint is re- located. Sinuses/Orbits: Clear/normal Other: None IMPRESSION: No acute or traumatic finding. Atrophy and chronic small-vessel ischemic changes of the white matter. Electronically Signed   By: Paulina Fusi M.D.   On: 03/16/2020 03:23   CT Head Wo Contrast  Result Date: 03/02/2020 CLINICAL DATA:  "Head trauma, minor". EXAM: CT HEAD WITHOUT CONTRAST TECHNIQUE: Contiguous axial images were obtained from the base of the skull through the vertex without intravenous contrast. COMPARISON:  02/20/2020. FINDINGS: Brain: Advanced cerebral and cerebellar atrophy. Moderate low density in the periventricular white matter likely related to small vessel disease. No mass lesion, hemorrhage, hydrocephalus, acute infarct, intra-axial, or extra-axial fluid collection. Vascular: Intracranial atherosclerosis. Skull: No scalp soft tissue swelling.  No skull fracture. Sinuses/Orbits: Evaluated on dedicated face CT, dictated separately. Other: None. IMPRESSION: 1. No acute intracranial abnormality. 2. Cerebral atrophy and small vessel ischemic change. Electronically Signed   By: Jeronimo Greaves M.D.   On: 03/02/2020 14:53   CT Cervical Spine Wo Contrast  Result Date: 03/02/2020 CLINICAL DATA:  75 year old male with  history of neck trauma from  a fall on 02/28/2020. On Eliquis. Swelling in the left side of face. EXAM: CT MAXILLOFACIAL WITHOUT CONTRAST CT CERVICAL SPINE WITHOUT CONTRAST TECHNIQUE: Multidetector CT imaging of the maxillofacial structures was performed. Multiplanar CT image reconstructions were also generated. A small metallic BB was placed on the right temple in order to reliably differentiate right from left. Multidetector CT imaging of the cervical spine was performed without intravenous contrast. Multiplanar CT image reconstructions were also generated. COMPARISON:  Head CT 03/02/2020. FINDINGS: CT MAXILLOFACIAL FINDINGS Osseous: No acute displaced fracture. Left mandibular condyle is anteriorly dislocated (sagittal image 76 of series 11). No destructive process. Orbits: Negative. No traumatic or inflammatory finding. Sinuses: Clear. Soft tissues: A extensive soft tissue swelling in the subcutaneous fat of the left side of the face lateral to the left mandible. Limited intracranial: See separate report for recently obtained head CT dated 03/02/2020 for full description of intracranial findings. CT CERVICAL FINDINGS Alignment: Normal. Skull base and vertebrae: No acute fracture. No primary bone lesion or focal pathologic process. Soft tissues and spinal canal: No prevertebral fluid or swelling. No visible canal hematoma. Disc levels: Mild multilevel degenerative disc disease, most apparent at C5-C6. Mild multilevel facet arthropathy. Upper chest: Unremarkable. Other: None. IMPRESSION: 1. Anterior dislocation of the left mandibular condyle from the temporomandibular joint with extensive overlying soft tissue swelling. 2. No evidence of significant acute traumatic injury to the cervical spine. Electronically Signed   By: Trudie Reed M.D.   On: 03/02/2020 15:14   MR BRAIN WO CONTRAST  Result Date: 03/28/2020 CLINICAL DATA:  Neuro deficit with acute stroke suspected. Seizure and sepsis per prior imaging  reports. EXAM: MRI HEAD WITHOUT CONTRAST TECHNIQUE: Multiplanar, multiecho pulse sequences of the brain and surrounding structures were obtained without intravenous contrast. COMPARISON:  Head CT from yesterday.  Brain MRI 01/19/2020 FINDINGS: Brain: No acute infarction, hemorrhage, hydrocephalus, extra-axial collection or mass lesion. FLAIR hyperintensity in the pons and cerebral white matter especially around the lateral ventricles, compatible with chronic small vessel ischemia. Cerebral volume loss without specific pattern. Vascular: Preserved flow voids Skull and upper cervical spine: Normal marrow signal Sinuses/Orbits: Negative Other: Progressively motion degraded study. IMPRESSION: 1. No acute finding, including infarct. 2. Chronic small vessel ischemia in the cerebral white matter and pons. 3. Nonspecific brain atrophy. 4. Motion degraded. Electronically Signed   By: Marnee Spring M.D.   On: 03/28/2020 06:07   MR BRAIN WO CONTRAST  Result Date: 03/06/2020 CLINICAL DATA:  Delirium. Currently being treated for acute left parotitis with sepsis. EXAM: MRI HEAD WITHOUT CONTRAST TECHNIQUE: Multiplanar, multiecho pulse sequences of the brain and surrounding structures were obtained without intravenous contrast. COMPARISON:  Head and maxillofacial CTs 03/02/2020. Head MRI 01/19/2020. FINDINGS: Brain: There is no evidence of an acute infarct, intracranial hemorrhage, mass, midline shift, or extra-axial fluid collection. T2 hyperintensities in the cerebral white matter and pons are similar to the prior MRI and are nonspecific but compatible with moderate chronic small vessel ischemic disease. There is moderate cerebral atrophy. Vascular: Major intracranial vascular flow voids are preserved. Skull and upper cervical spine: Unremarkable bone marrow signal. Sinuses/Orbits: Unremarkable orbits. Paranasal sinuses and mastoid air cells are clear. Other: Left parotid swelling consistent related to known parotitis.  IMPRESSION: 1. No acute intracranial abnormality. 2. Moderate chronic small vessel ischemic disease and cerebral atrophy. 3. Acute left parotitis. Electronically Signed   By: Sebastian Ache M.D.   On: 03/06/2020 17:20   DG Chest Port 1 View  Result Date: 03/26/2020 CLINICAL DATA:  Altered mental status EXAM: PORTABLE CHEST 1 VIEW COMPARISON:  03/17/2020 FINDINGS: Left shoulder replacement. Low lung volumes. No consolidation or effusion. Stable cardiomediastinal silhouette. No pneumothorax. IMPRESSION: Low lung volumes. Electronically Signed   By: Jasmine Pang M.D.   On: 03/26/2020 22:49   DG CHEST PORT 1 VIEW  Result Date: 03/14/2020 CLINICAL DATA:  Respiratory distress EXAM: PORTABLE CHEST 1 VIEW COMPARISON:  03/02/2020 FINDINGS: Mild cardiomegaly. Right-greater-than-left bibasilar opacities, likely atelectasis or early consolidation. No pneumothorax or sizable pleural effusion. IMPRESSION: Right-greater-than-left bibasilar opacities, likely atelectasis or early consolidation. Electronically Signed   By: Deatra Robinson M.D.   On: 03/14/2020 21:08   DG Chest Port 1 View  Result Date: 03/02/2020 CLINICAL DATA:  Dyspnea EXAM: PORTABLE CHEST 1 VIEW COMPARISON:  January 21, 2020 FINDINGS: The heart size and mediastinal contours are stable. The aorta is tortuous. Both lungs are clear. Chronic deformity of several lower right ribs are noted. IMPRESSION: No active disease. Electronically Signed   By: Sherian Rein M.D.   On: 03/02/2020 12:28   EEG adult  Result Date: 03/09/2020 Charlsie Quest, MD     03/09/2020  9:04 AM Patient Name: Chad Duke MRN: [id number] Epilepsy Attending: Charlsie Quest Referring Physician/Provider: Dr Huey Bienenstock Date: 03/08/2020 Duration: 22.24 mins Patient history: 75yo M with ams, EEG to evaluate for seizure Level of alertness: Awake AEDs during EEG study: None Technical aspects: This EEG study was done with scalp electrodes positioned according to the 10-20  International system of electrode placement. Electrical activity was acquired at a sampling rate of  and reviewed with a high frequency filter of  and a low frequency filter of . EEG data were recorded continuously and digitally stored. Description: No posterior dominant rhythm was seen. EEG showed continuous generalized 5 to 6 Hz theta as well as intermittent 2-3hz  delta slowing.  Hyperventilation and photic stimulation were not performed.   ABNORMALITY -Continuous slow, generalized IMPRESSION: This study is suggestive of mild to moderate diffuse encephalopathy, nonspecific etiology. No seizures or epileptiform discharges were seen throughout the recording. Priyanka Annabelle Harman   CT MAXILLOFACIAL WO CONTRAST  Result Date: 03/02/2020 CLINICAL DATA:  75 year old male with history of neck trauma from a fall on 02/28/2020. On Eliquis. Swelling in the left side of face. EXAM: CT MAXILLOFACIAL WITHOUT CONTRAST CT CERVICAL SPINE WITHOUT CONTRAST TECHNIQUE: Multidetector CT imaging of the maxillofacial structures was performed. Multiplanar CT image reconstructions were also generated. A small metallic BB was placed on the right temple in order to reliably differentiate right from left. Multidetector CT imaging of the cervical spine was performed without intravenous contrast. Multiplanar CT image reconstructions were also generated. COMPARISON:  Head CT 03/02/2020. FINDINGS: CT MAXILLOFACIAL FINDINGS Osseous: No acute displaced fracture. Left mandibular condyle is anteriorly dislocated (sagittal image 76 of series 11). No destructive process. Orbits: Negative. No traumatic or inflammatory finding. Sinuses: Clear. Soft tissues: A extensive soft tissue swelling in the subcutaneous fat of the left side of the face lateral to the left mandible. Limited intracranial: See separate report for recently obtained head CT dated 03/02/2020 for full description of intracranial findings. CT CERVICAL FINDINGS Alignment:  Normal. Skull base and vertebrae: No acute fracture. No primary bone lesion or focal pathologic process. Soft tissues and spinal canal: No prevertebral fluid or swelling. No visible canal hematoma. Disc levels: Mild multilevel degenerative disc disease, most apparent at C5-C6. Mild multilevel facet arthropathy. Upper chest: Unremarkable. Other: None. IMPRESSION: 1. Anterior dislocation of the left mandibular condyle from the  temporomandibular joint with extensive overlying soft tissue swelling. 2. No evidence of significant acute traumatic injury to the cervical spine. Electronically Signed   By: Trudie Reed M.D.   On: 03/02/2020 15:14    Microbiology: Recent Results (from the past 240 hour(s))  Culture, blood (routine x 2)     Status: None (Preliminary result)   Collection Time: 03/26/20 11:23 PM   Specimen: BLOOD RIGHT HAND  Result Value Ref Range Status   Specimen Description BLOOD RIGHT HAND  Final   Special Requests   Final    BOTTLES DRAWN AEROBIC AND ANAEROBIC Blood Culture results may not be optimal due to an inadequate volume of blood received in culture bottles   Culture   Final    NO GROWTH 2 DAYS Performed at [facility], [address], [place], [place] [address]    Report Status PENDING  Incomplete  Culture, blood (routine x 2)     Status: None (Preliminary result)   Collection Time: 03/27/20 12:45 AM   Specimen: BLOOD  Result Value Ref Range Status   Specimen Description BLOOD RIGHT ARM  Final   Special Requests   Final    BOTTLES DRAWN AEROBIC AND ANAEROBIC Blood Culture results may not be optimal due to an excessive volume of blood received in culture bottles   Culture   Final    NO GROWTH 2 DAYS Performed at [facility], [address] [place], [place] [address]    Report Status PENDING  Incomplete  Resp Panel by RT-PCR (Flu A&B, Covid) Nasopharyngeal Swab     Status: Abnormal   Collection Time: 03/27/20  4:15 AM   Specimen: Nasopharyngeal Swab;  Nasopharyngeal(NP) swabs in vial transport medium  Result Value Ref Range Status   SARS Coronavirus 2 by RT PCR POSITIVE (A) NEGATIVE Final    Comment: RESULT CALLED TO, READ BACK BY AND VERIFIED WITH: J DODD RN 03/27/20 0507 JDW (NOTE) SARS-CoV-2 target nucleic acids are DETECTED.  The SARS-CoV-2 RNA is generally detectable in upper respiratory specimens during the acute phase of infection. Positive results are indicative of the presence of the identified virus, but do not rule out bacterial infection or co-infection with other pathogens not detected by the test. Clinical correlation with patient history and other diagnostic information is necessary to determine patient infection status. The expected result is Negative.  Fact Sheet for Patients: BloggerCourse.com  Fact Sheet for Healthcare Providers: SeriousBroker.it  This test is not yet approved or cleared by the [place] FDA and  has been authorized for detection and/or diagnosis of SARS-CoV-2 by FDA under an Emergency Use Authorization (EUA).  This EUA will remain in effect (meaning this test can be used)  for the duration of  the COVID-19 declaration under Section 564(b)(1) of the Act, 21 U.S.C. section 360bbb-3(b)(1), unless the authorization is terminated or revoked sooner.     Influenza A by PCR NEGATIVE NEGATIVE Final   Influenza B by PCR NEGATIVE NEGATIVE Final    Comment: (NOTE) The Xpert Xpress SARS-CoV-2/FLU/RSV plus assay is intended as an aid in the diagnosis of influenza from Nasopharyngeal swab specimens and should not be used as a sole basis for treatment. Nasal washings and aspirates are unacceptable for Xpert Xpress SARS-CoV-2/FLU/RSV testing.  Fact Sheet for Patients: BloggerCourse.com  Fact Sheet for Healthcare Providers: SeriousBroker.it  This test is not yet approved or cleared by the [place] FDA and has been authorized for detection and/or diagnosis of SARS-CoV-2 by FDA  under an Emergency Use Authorization (EUA). This EUA will remain in effect (meaning this test can be used) for the duration of the COVID-19 declaration under Section 564(b)(1) of the Act, 21 U.S.C. section 360bbb-3(b)(1), unless the authorization is terminated or revoked.  Performed at [facility], [address], [place], [place] [address]   Urine culture     Status: Abnormal   Collection Time: 03/27/20  5:44 AM   Specimen: Urine, Random  Result Value Ref Range Status   Specimen Description URINE, RANDOM  Final   Special Requests   Final    NONE Performed at [facility], [address], [place], [place] [address]    Culture MULTIPLE SPECIES PRESENT, SUGGEST RECOLLECTION (A)  Final   Report Status 03/28/2020 FINAL  Final  MRSA PCR Screening     Status: Abnormal   Collection Time: 03/27/20 10:37 AM   Specimen: Nasal Mucosa; Nasopharyngeal  Result Value Ref Range Status   MRSA by PCR POSITIVE (A) NEGATIVE Final    Comment:        The GeneXpert MRSA Assay (FDA approved for NASAL specimens only), is one component of a comprehensive MRSA colonization surveillance program. It is not intended to diagnose MRSA infection nor to guide or monitor treatment for MRSA infections. RESULT CALLED TO, READ BACK BY AND VERIFIED WITH: RN Tenna Child [id number] AT 1310 BY CM Performed at [facility], [address] [place], [place] [address]      Labs: Basic Metabolic Panel: Recent Labs  Lab 03/27/20 0047 03/27/20 0048 03/28/20 0041 03/29/20 0410  NA 140 141 137 136  K 4.0 3.3* 3.1* 3.5  CL 107 103 106 106  CO2 21*  --  22 23  GLUCOSE 109* 111* 86 98  BUN CREATININE 1.69* 1.70* 1.42* 1.43*  CALCIUM 9.6  --  8.7* 8.5*  MG 1.3*  --  1.4*  --    Liver Function Tests: Recent Labs  Lab 03/27/20 0047 03/28/20 0041 03/29/20 0410  AST ALT ALKPHOS 88 68 70  BILITOT 0.6 0.8 0.5  PROT 6.0* 5.0* 4.8*  ALBUMIN 2.1* 1.7* 1.6*   No results for input(s): LIPASE, AMYLASE in the last 168 hours. Recent Labs  Lab 03/26/20 2233  AMMONIA 28   CBC: Recent Labs  Lab 03/27/20 0047 03/27/20 0048 03/28/20 0041 03/29/20 0410  WBC 7.2  --  7.3 7.7  NEUTROABS 3.7  --  4.0 3.7  HGB 8.3* 8.5* 8.3* 8.2*  HCT 26.0* 25.0* 26.7* 26.2*  MCV 99.2  --  99.6 100.0  PLT 233  --  222 214   Cardiac Enzymes: No results for input(s): CKTOTAL, CKMB, CKMBINDEX, TROPONINI in the last 168 hours. BNP: BNP (last 3 results) Recent Labs    03/04/20 0131  BNP 178.1*    ProBNP (last 3 results) No results for input(s): PROBNP in the last 8760 hours.  CBG: Recent Labs  Lab 03/29/20 0541 03/29/20 0822 03/29/20 1207 03/29/20 1620 03/29/20 2353  GLUCAP 79 65* 84 78 79       Signed:  Albertine Grates MD, PhD, FACP  [facility] 03/30/2020, 8:44 AM

## 2020-03-30 NOTE — TOC Transition Note (Signed)
Transition of Care [facility]) - CM/SW Discharge Note   Patient Details  Name: Chad Duke MRN: [id number] Date of Birth: April 23, 1945  [facility] CM/SW Contact:  Carley Hammed, LCSWA Phone Number: 03/30/2020, 10:32 AM   Clinical Narrative:    Nurse to call report to [phone] Rm# [phone]   Final next level of care: Skilled Nursing Facility Barriers to Discharge: Barriers Resolved   Patient Goals and CMS Choice        Discharge Placement              Patient chooses bed at: [facility] Patient to be transferred to facility by: [facility] Name of family member notified: Santo Held Patient and family notified of of transfer: 03/30/20  Discharge Plan and Services                                     Social Determinants of Health (SDOH) Interventions     Readmission Risk Interventions Readmission Risk Prevention Plan 03/14/2020  Transportation Screening Complete  Medication Review (RN Care Manager) Referral to Pharmacy  PCP or Specialist appointment within 3-5 days of discharge Complete  HRI or Home Care Consult Complete  SW Recovery Care/Counseling Consult Complete  Palliative Care Screening Not Applicable  Skilled Nursing Facility Complete  Some recent data might be hidden

## 2020-03-31 DIAGNOSIS — N179 Acute kidney failure, unspecified: Secondary | ICD-10-CM

## 2020-03-31 DIAGNOSIS — R4182 Altered mental status, unspecified: Secondary | ICD-10-CM

## 2020-04-01 LAB — CULTURE, BLOOD (ROUTINE X 2)
Culture: NO GROWTH
Culture: NO GROWTH

## 2020-04-11 ENCOUNTER — Telehealth: Payer: Self-pay | Admitting: Neurology

## 2020-04-11 NOTE — Progress Notes (Deleted)
[facility]    Provider:  Dr Lucia Gaskins Requesting Provider: Charlynne Pander, MD Primary Care Provider:  Soundra Pilon, FNP  CC:  ***  HPI:  Chad Duke is a 75 y.o. male here as requested by Charlynne Pander, MD for weakness.  Past medical history DVT, orthostatic hypotension, coronary artery disease, hypertension, chronic obstructive pulmonary disease, sleep apnea, diabetes, rheumatoid arthritis, acute kidney injury superimposed on chronic kidney disease, abnormality of gait, fall, morbid obesity, hyperlipidemia.  It appears patient was discharged on March 30, 2020 to a SNF with hospice care and full comfort measures, he was seen for jerking movements of the extremities, discharged last month, he was diagnosed with a Covid infection in January 2022 and admitted recently for sepsis from parotitis and discharged on March 14, 2020 was found to have increasing abnormal movements of the extremities concerning for seizures over the last 24 hours and brought to the ER, he was confused, CT of the head was unremarkable, patient was hypothermic with temperature of 95, possible early sepsis in an immunosuppressed individual with hypothermia, acute metabolic encephalopathy likely from sepsis, MRI of the brain showed no acute findings but showed chronic small vessel ischemia and cerebral atrophy, EEG negative for seizure, neurology thought seizure was less likely jerking movement likely from sepsis, blood culture no growth and urine culture with multiple species, improved on broad-spectrum antibiotics.  Again, he was transition to full comfort measures and return to SNF with hospice.  Reviewed notes, labs and imaging from outside physicians, which showed ***  Review of Systems: Patient complains of symptoms per HPI as well as the following symptoms ***. Pertinent negatives and positives per HPI. All others negative.   Social History   Socioeconomic History  . Marital status:  Divorced    Spouse name: Not on file  . Number of children: 2  . Years of education: Not on file  . Highest education level: Not on file  Occupational History  . Not on file  Tobacco Use  . Smoking status: Current Every Day Smoker    Packs/day: 2.00    Years: 53.00    Pack years: 106.00    Types: Cigarettes  . Smokeless tobacco: Never Used  . Tobacco comment: currently smoking 1ppd--01/09/2020  Vaping Use  . Vaping Use: Never used  Substance and Sexual Activity  . Alcohol use: Not Currently    Alcohol/week: 0.0 standard drinks  . Drug use: No  . Sexual activity: Not on file  Other Topics Concern  . Not on file  Social History Narrative  . Not on file   Social Determinants of Health   Financial Resource Strain: Not on file  Food Insecurity: Not on file  Transportation Needs: Not on file  Physical Activity: Not on file  Stress: Not on file  Social Connections: Not on file  Intimate Partner Violence: Not on file    Family History  Problem Relation Age of Onset  . Emphysema Father   . Asthma Father   . Osteoarthritis Father   . Heart attack Mother   . Uterine cancer Mother   . Heart disease Mother   . ADD / ADHD Son   . Depression Son   . Hypertension Brother   . Heart disease Brother        Pacemaker   . Heart murmur Brother     Past Medical History:  Diagnosis Date  . AAA (abdominal aortic aneurysm) (HCC)   . Allergy    Rhinitis  .  Arthritis    Rhuematoid  . Asthma   . CHF (congestive heart failure) (HCC)    chronic diastolic (congestive) heart failure from notes from [facility] face sheet  . Chronic renal insufficiency   . Depression    per notes from [facility] on chart  . Diverticulosis   . Emphysema   . Falls frequently   . GERD (gastroesophageal reflux disease)   . Hearing loss of both ears   . Hiatal hernia   . Hyperlipidemia   . Hypertension   . Male hypogonadism   . Nephrolithiasis   . Nephrolithiasis   . Sleep apnea    On CPAP   . Stasis dermatitis   . Varicose veins   . Vitamin B 12 deficiency     Patient Active Problem List   Diagnosis Date Noted  . AKI (acute kidney injury) (HCC)   . Altered mental status   . Hypothermia 03/27/2020  . COVID-19 virus infection 03/27/2020  . Severe sepsis (HCC) 03/02/2020  . Parotitis 03/02/2020  . Chronic renal disease, stage III (HCC) 01/25/2018  . Weakness 10/03/2017  . Adult failure to thrive 10/03/2017  . Positive D dimer 09/26/2017  . Elevated troponin 09/26/2017  . Personal history of DVT (deep vein thrombosis) 09/26/2017  . COPD with acute exacerbation (HCC) 09/26/2017  . CAD (coronary artery disease) 09/08/2017  . Goals of care, counseling/discussion   . Palliative care by specialist   . Acute kidney injury superimposed on chronic kidney disease (HCC) 09/02/2017  . Hypokalemia 09/02/2017  . Hypomagnesemia 09/02/2017  . Controlled type 2 diabetes mellitus with hyperglycemia (HCC) 09/02/2017  . Urinary incontinence 09/02/2017  . Fall 08/13/2017  . Acute renal failure superimposed on stage 3 chronic kidney disease (HCC) 08/13/2017  . HLD (hyperlipidemia) 08/13/2017  . Essential hypertension 08/13/2017  . GERD (gastroesophageal reflux disease) 08/13/2017  . Orthostatic hypotension 08/13/2017  . Deep vein thrombosis (DVT) of both lower extremities (HCC)   . Hyperglycemia   . History of pulmonary embolism 07/20/2017  . Acute on chronic diastolic heart failure (HCC) 07/20/2017  . Bilateral leg edema 07/20/2017  . Acute exacerbation of chronic obstructive pulmonary disease (COPD) (HCC) 07/19/2017  . 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 Cigarette smoker 09/08/2016  . Morbid obesity due to excess calories (HCC) 09/08/2016  . Pain in joint, ankle and foot 03/31/2012  . Abnormality of gait 03/31/2012  . Rheumatoid arthritis (HCC) 03/31/2012  . COPD GOLD III  02/12/2011  . Sleep apnea 02/12/2011    Past Surgical History:  Procedure Laterality Date  .  CARPAL TUNNEL RELEASE     bilateral  . CYSTOSCOPY/URETEROSCOPY/HOLMIUM LASER/STENT PLACEMENT Left 11/02/2018   Procedure: CYSTOSCOPY/URETEROSCOPY/HOLMIUM LASER/STENT PLACEMENT;  Surgeon: Crista Elliot, MD;  Location: [facility];  Service: Urology;  Laterality: Left;  . FRACTURE SURGERY Left    Fibula  . JOINT REPLACEMENT Left    Shoulder  . JOINT REPLACEMENT Left    Hip   . KIDNEY STONE SURGERY  2003  . left hip replacement    . left knee    . left shoulder    . LITHOTRIPSY      Current Outpatient Medications  Medication Sig Dispense Refill  . acetaminophen (TYLENOL) 325 MG tablet Take 2 tablets (650 mg total) by mouth every 6 (six) hours as needed for mild pain (or Fever >/= 101).    Marland Kitchen albuterol (PROVENTIL HFA;VENTOLIN HFA) 108 (90 Base) MCG/ACT inhaler Inhale 2 puffs into the lungs every 4 (four) hours as  needed for wheezing or shortness of breath. 3 Inhaler 1  . antiseptic oral rinse (BIOTENE) LIQD Apply 15 mLs topically as needed for dry mouth.    Marland Kitchen atropine 1 % ophthalmic solution Place 2 drops under the tongue 4 (four) times daily as needed (secretions). 2 mL 12  . bisacodyl (DULCOLAX) 10 MG suppository Place 1 suppository (10 mg total) rectally daily as needed (constipation). 12 suppository 0  . budesonide (PULMICORT) 0.25 MG/2ML nebulizer solution Take 0.25 mg by nebulization 2 (two) times daily.    . budesonide-formoterol (SYMBICORT) 160-4.5 MCG/ACT inhaler Inhale 2 puffs into the lungs 2 (two) times daily.    Marland Kitchen ipratropium-albuterol (DUONEB) 0.5-2.5 (3) MG/3ML SOLN Take 3 mLs by nebulization 3 (three) times daily.     Marland Kitchen LORazepam (ATIVAN) 0.5 MG tablet Take 1 tablet (0.5 mg total) by mouth every 6 (six) hours as needed for anxiety or sleep (agitation, restlessness). 5 tablet 0  . oxyCODONE (ROXICODONE) 5 MG immediate release tablet Take 1 tablet (5 mg total) by mouth every 4 (four) hours as needed for severe pain. 5 tablet 0  . polyvinyl alcohol (LIQUIFILM TEARS) 1.4 %  ophthalmic solution Place 1 drop into both eyes 4 (four) times daily as needed for dry eyes. 15 mL 0  . QUEtiapine (SEROQUEL) 25 MG tablet Take 1 tablet (25 mg total) by mouth at bedtime. (Patient taking differently: Take 50 mg by mouth at bedtime.)     No current facility-administered medications for this visit.    Allergies as of 04/12/2020 - Review Complete 03/27/2020  Allergen Reaction Noted  . Varenicline Other (See Comments) 06/13/2014  . Gabapentin Other (See Comments) 01/11/2020    Vitals: There were no vitals taken for this visit. Last Weight:  Wt Readings from Last 1 Encounters:  03/27/20 214 lb (97.1 kg)   Last Height:   Ht Readings from Last 1 Encounters:  03/02/20  (1.727 m)     Physical exam: Exam: Gen: NAD, conversant, well nourised, obese, well groomed                     CV: RRR, no MRG. No Carotid Bruits. No peripheral edema, warm, nontender Eyes: Conjunctivae clear without exudates or hemorrhage  Neuro: Detailed Neurologic Exam  Speech:    Speech is normal; fluent and spontaneous with normal comprehension.  Cognition:    The patient is oriented to person, place, and time;     recent and remote memory intact;     language fluent;     normal attention, concentration,     fund of knowledge Cranial Nerves:    The pupils are equal, round, and reactive to light. The fundi are normal and spontaneous venous pulsations are present. Visual fields are full to finger confrontation. Extraocular movements are intact. Trigeminal sensation is intact and the muscles of mastication are normal. The face is symmetric. The palate elevates in the midline. Hearing intact. Voice is normal. Shoulder shrug is normal. The tongue has normal motion without fasciculations.   Coordination:    Normal finger to nose and heel to shin. Normal rapid alternating movements.   Gait:    Heel-toe and tandem gait are normal.   Motor Observation:    No asymmetry, no atrophy, and no  involuntary movements noted. Tone:    Normal muscle tone.    Posture:    Posture is normal. normal erect    Strength:    Strength is V/V in the upper and lower limbs.  Sensation: intact to LT     Reflex Exam:  DTR's:    Deep tendon reflexes in the upper and lower extremities are normal bilaterally.   Toes:    The toes are downgoing bilaterally.   Clonus:    Clonus is absent.    Assessment/Plan:    No orders of the defined types were placed in this encounter.  No orders of the defined types were placed in this encounter.   Cc: Charlynne Pander, MD,  Soundra Pilon, FNP  [place] , MD  [facility] [address] [place], [place] [address]  Phone [phone] Fax [phone]

## 2020-04-11 NOTE — Telephone Encounter (Signed)
Jillian: Patient is scheduled to come in tomorrow but after reviewing his chart he is in a nursing facility and on hospice with full comfort measures (meaning he is not getting any medications or medical care other than that which will make him comfortable).  His family has canceled all other appointments.  On review, it appears that this appointment was made with the facility and not with his guardians.  He should always try to inform family members if patient is in a nursing facility. I have been trying to reach his family members, Valli Glance would you try calling again in the morning and also calling his nursing facility to inquire about this I am guessing family may cancel.  Percell Belt, if patients have guardians or may be on hospice or comfort measures, may be we should make sure that relatives want them to come in.  Also, for family members in nursing facilities, may be we should try to call their family to make sure that they know about the appointment and actually come with them too.

## 2020-04-12 ENCOUNTER — Ambulatory Visit: Payer: Medicare Other | Admitting: Neurology

## 2020-04-12 NOTE — Telephone Encounter (Signed)
Spoke with facility again, they confirmed that Chad Duke is on comfort care/hospice. Cancelled today's appointment.

## 2020-04-12 NOTE — Telephone Encounter (Signed)
I was unable to get through to pt's POA, but I left her a message asking her to give [place] a call back. The facility states Mr. Seabrooks is still on their list to transport out for today's appointment, but they weren't able to give me much information on his comfort care status.

## 2020-04-12 NOTE — Telephone Encounter (Signed)
Thanks Jillian, I also left messages at all phone numbers listed last night. I left my cell phone number for family to call back. No oe has. I think this was the right decision to cancel. Thank you!

## 2020-04-25 ENCOUNTER — Ambulatory Visit (HOSPITAL_COMMUNITY)
Admission: RE | Admit: 2020-04-25 | Payer: Medicare Other | Source: Ambulatory Visit | Attending: Cardiology | Admitting: Cardiology

## 2020-05-10 DEATH — deceased
